# Patient Record
Sex: Female | Born: 1985
Health system: Southern US, Community
[De-identification: ages and names within clinical notes are randomized; demographics above are authoritative.]

## PROBLEM LIST (undated history)

## (undated) DIAGNOSIS — R109 Unspecified abdominal pain: Secondary | ICD-10-CM

## (undated) DIAGNOSIS — I10 Essential (primary) hypertension: Secondary | ICD-10-CM

## (undated) DIAGNOSIS — E876 Hypokalemia: Secondary | ICD-10-CM

## (undated) DIAGNOSIS — D735 Infarction of spleen: Secondary | ICD-10-CM

## (undated) DIAGNOSIS — I151 Hypertension secondary to other renal disorders: Secondary | ICD-10-CM

## (undated) DIAGNOSIS — E669 Obesity, unspecified: Secondary | ICD-10-CM

## (undated) DIAGNOSIS — E538 Deficiency of other specified B group vitamins: Secondary | ICD-10-CM

## (undated) DIAGNOSIS — C801 Malignant (primary) neoplasm, unspecified: Secondary | ICD-10-CM

## (undated) DIAGNOSIS — F1010 Alcohol abuse, uncomplicated: Secondary | ICD-10-CM

## (undated) DIAGNOSIS — M359 Systemic involvement of connective tissue, unspecified: Secondary | ICD-10-CM

## (undated) DIAGNOSIS — K859 Acute pancreatitis without necrosis or infection, unspecified: Secondary | ICD-10-CM

## (undated) DIAGNOSIS — E119 Type 2 diabetes mellitus without complications: Secondary | ICD-10-CM

## (undated) DIAGNOSIS — J90 Pleural effusion, not elsewhere classified: Secondary | ICD-10-CM

## (undated) HISTORY — DX: Alcohol abuse, uncomplicated: F10.10

## (undated) HISTORY — PX: TONSILLECTOMY: SUR1361

## (undated) HISTORY — DX: Hypokalemia: E87.6

## (undated) HISTORY — DX: Hypertension secondary to other renal disorders: I15.1

## (undated) HISTORY — DX: Deficiency of other specified B group vitamins: E53.8

## (undated) HISTORY — PX: OTHER SURGICAL HISTORY: SHX169

## (undated) HISTORY — PX: ADENOIDECTOMY: SUR15

## (undated) HISTORY — DX: Acute pancreatitis without necrosis or infection, unspecified: K85.90

## (undated) HISTORY — PX: CHEST TUBE INSERTION: SHX231

---
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
## 2005-02-06 ENCOUNTER — Emergency Department: Payer: Self-pay | Admitting: Emergency Medicine

## 2005-08-26 ENCOUNTER — Emergency Department: Payer: Self-pay | Admitting: Internal Medicine

## 2006-07-28 ENCOUNTER — Emergency Department: Payer: Self-pay | Admitting: Internal Medicine

## 2007-08-06 ENCOUNTER — Inpatient Hospital Stay (HOSPITAL_COMMUNITY): Admission: AD | Admit: 2007-08-06 | Discharge: 2007-08-06 | Payer: Self-pay | Admitting: Obstetrics and Gynecology

## 2007-08-06 ENCOUNTER — Ambulatory Visit: Payer: Self-pay | Admitting: *Deleted

## 2007-09-16 ENCOUNTER — Inpatient Hospital Stay (HOSPITAL_COMMUNITY): Admission: AD | Admit: 2007-09-16 | Discharge: 2007-09-16 | Payer: Self-pay | Admitting: Obstetrics & Gynecology

## 2007-09-16 ENCOUNTER — Ambulatory Visit: Payer: Self-pay | Admitting: Physician Assistant

## 2007-09-22 ENCOUNTER — Inpatient Hospital Stay (HOSPITAL_COMMUNITY): Admission: AD | Admit: 2007-09-22 | Discharge: 2007-09-22 | Payer: Self-pay | Admitting: Obstetrics & Gynecology

## 2007-09-22 ENCOUNTER — Ambulatory Visit: Payer: Self-pay | Admitting: Obstetrics & Gynecology

## 2007-09-27 ENCOUNTER — Ambulatory Visit: Payer: Self-pay | Admitting: Obstetrics and Gynecology

## 2007-09-27 ENCOUNTER — Inpatient Hospital Stay (HOSPITAL_COMMUNITY): Admission: AD | Admit: 2007-09-27 | Discharge: 2007-09-30 | Payer: Self-pay | Admitting: Obstetrics & Gynecology

## 2007-09-28 ENCOUNTER — Encounter: Payer: Self-pay | Admitting: Obstetrics & Gynecology

## 2011-04-27 LAB — URINALYSIS, DIPSTICK ONLY
Glucose, UA: NEGATIVE
Ketones, ur: >80 — AB
Leukocytes, UA: NEGATIVE
Protein, ur: NEGATIVE
Specific Gravity, Urine: 1.025
Urobilinogen, UA: 0.2

## 2011-04-27 LAB — COMPREHENSIVE METABOLIC PANEL
ALT: 13
AST: 17
Alkaline Phosphatase: 192 — ABNORMAL HIGH
CO2: 21
Chloride: 102
GFR calc Af Amer: >60
GFR calc non Af Amer: >60
Potassium: 3.9
Sodium: 134 — ABNORMAL LOW
Total Bilirubin: 0.6

## 2011-04-27 LAB — URIC ACID: Uric Acid, Serum: 4

## 2011-04-27 LAB — CBC
HCT: 39.8
Hemoglobin: 13.8
MCHC: 34.5
MCV: 88.1
Platelets: 316
RDW: 14.5
WBC: 16.9 — ABNORMAL HIGH

## 2011-04-27 LAB — RPR: RPR Ser Ql: NONREACTIVE

## 2011-05-11 LAB — CBC
HCT: 36.3
Platelets: 341
RDW: 13.1
WBC: 14.5 — ABNORMAL HIGH

## 2011-05-11 LAB — COMPREHENSIVE METABOLIC PANEL
ALT: 9
Albumin: 2.8 — ABNORMAL LOW
Alkaline Phosphatase: 139 — ABNORMAL HIGH
Chloride: 104
GFR calc Af Amer: >60
Glucose, Bld: 76
Potassium: 3.4 — ABNORMAL LOW
Sodium: 135
Total Protein: 6.5

## 2011-05-11 LAB — WET PREP, GENITAL: Clue Cells Wet Prep HPF POC: NONE SEEN

## 2011-05-11 LAB — GC/CHLAMYDIA PROBE AMP, GENITAL
Chlamydia, DNA Probe: NEGATIVE
GC Probe Amp, Genital: NEGATIVE

## 2011-05-11 LAB — URINALYSIS, ROUTINE W REFLEX MICROSCOPIC
Bilirubin Urine: NEGATIVE
Glucose, UA: NEGATIVE
Hgb urine dipstick: NEGATIVE
Ketones, ur: NEGATIVE
Nitrite: NEGATIVE
Protein, ur: NEGATIVE
Urobilinogen, UA: 0.2
pH: 6

## 2011-05-11 LAB — FETAL FIBRONECTIN: Fetal Fibronectin: NEGATIVE

## 2011-05-27 ENCOUNTER — Emergency Department (HOSPITAL_COMMUNITY)
Admission: EM | Admit: 2011-05-27 | Discharge: 2011-05-27 | Disposition: A | Discharge status: Home / Self Care | Payer: Self-pay | Attending: Emergency Medicine | Admitting: Emergency Medicine

## 2011-05-27 ENCOUNTER — Encounter: Payer: Self-pay | Admitting: Emergency Medicine

## 2011-05-27 ENCOUNTER — Emergency Department (HOSPITAL_COMMUNITY): Payer: Self-pay

## 2011-05-27 DIAGNOSIS — M25519 Pain in unspecified shoulder: Secondary | ICD-10-CM | POA: Insufficient documentation

## 2011-05-27 DIAGNOSIS — M436 Torticollis: Secondary | ICD-10-CM | POA: Insufficient documentation

## 2011-05-27 DIAGNOSIS — M5412 Radiculopathy, cervical region: Secondary | ICD-10-CM

## 2011-05-27 DIAGNOSIS — Y92009 Unspecified place in unspecified non-institutional (private) residence as the place of occurrence of the external cause: Secondary | ICD-10-CM | POA: Insufficient documentation

## 2011-05-27 DIAGNOSIS — W07XXXA Fall from chair, initial encounter: Secondary | ICD-10-CM | POA: Insufficient documentation

## 2011-05-27 DIAGNOSIS — R51 Headache: Secondary | ICD-10-CM | POA: Insufficient documentation

## 2011-05-27 DIAGNOSIS — F17200 Nicotine dependence, unspecified, uncomplicated: Secondary | ICD-10-CM | POA: Insufficient documentation

## 2011-05-27 DIAGNOSIS — IMO0002 Reserved for concepts with insufficient information to code with codable children: Secondary | ICD-10-CM | POA: Insufficient documentation

## 2011-05-27 MED ORDER — HYDROCODONE-ACETAMINOPHEN 5-325 MG PO TABS: 1.0000 | ORAL_TABLET | ORAL | Status: AC | PRN

## 2011-05-27 MED ORDER — CYCLOBENZAPRINE HCL 5 MG PO TABS: 5.0000 mg | ORAL_TABLET | Freq: Three times a day (TID) | ORAL | Status: AC | PRN

## 2011-05-27 MED ORDER — IBUPROFEN 800 MG PO TABS: 800.0000 mg | ORAL_TABLET | Freq: Three times a day (TID) | ORAL | Status: AC

## 2011-05-27 NOTE — ED Notes (Signed)
MD at bedside. Burgess Amor Pa_c at bedside discussing  Plan of care with pt.

## 2011-05-27 NOTE — ED Notes (Signed)
Pt refused pain medication at this time as she is driving home. And has 2 small children at this time.

## 2011-05-27 NOTE — ED Notes (Signed)
Patient reports falling on Tuesday from chair trying to clean ceiling fan. Patient reports hitting head but denies LOC, blurred vision, or dizziness. Patient c/o headache and neck pain that is getting progressively worse. Patient unable to turn head. Patient reports taking ibuprofen multiple times with no relief which she states "That is not like me."

## 2011-05-27 NOTE — ED Notes (Signed)
Pt states fell while standing on kitchen  chair attempting to clean ceiling fan, hitting rt  head, shoulder and elbow. Pt states has had increasing pain and stiffness in rt side of neck and shoulder since fall occurred. Pt reports taking motrin all week with no relief.

## 2011-06-01 NOTE — ED Provider Notes (Signed)
History     CSN: [id number] Arrival date & time: 05/27/2011 10:50 AM   First MD Initiated Contact with Patient 05/27/11 1055      Chief Complaint  Patient presents with  . Fall  . Headache  . Neck Pain    (Consider location/radiation/quality/duration/timing/severity/associated sxs/prior treatment) Patient is a 25 y.o. female presenting with fall, headaches, and neck pain. The history is provided by the patient.  Fall Incident onset: five days ago. Incident: She fell from a chair as she was trying to clean a ceiling fan.  She denies loc,  but did hit her head on the floor.. She fell from a height of 3 to 5 ft. She landed on a hard floor. The point of impact was the head. The pain is present in the head. The pain is at a severity of 9/10. The pain is severe. She was ambulatory at the scene. Associated symptoms include headaches. Pertinent negatives include no visual change, no fever, no numbness, no abdominal pain, no nausea, no vomiting, no loss of consciousness and no tingling. Associated symptoms comments: She reports neck and right shoulder pain.. The symptoms are aggravated by activity. She has tried NSAIDs for the symptoms. The treatment provided no relief.  Headache  Pain location: generalized headache. The quality of the pain is described as throbbing. The pain is at a severity of 9/10. The pain is severe. The pain does not radiate. Pertinent negatives include no fever, no shortness of breath, no nausea and no vomiting.  Neck Pain  The pain is present in the right side. The quality of the pain is described as aching. The pain is at a severity of 9/10. The symptoms are aggravated by twisting and position. Associated symptoms include headaches. Pertinent negatives include no visual change, no chest pain, no numbness, no paresis, no tingling and no weakness.    History reviewed. No pertinent past medical history.  History reviewed. No pertinent past surgical history.  History  reviewed. No pertinent family history.  History  Substance Use Topics  . Smoking status: Current Everyday Smoker -- 0.5 packs/day for 9 years    Types: Cigarettes  . Smokeless tobacco: Never Used  . Alcohol Use: 1.2 oz/week    2 Cans of beer per week    OB History    Grav Para Term Preterm Abortions TAB SAB Ect Mult Living   2 2 2       2       Review of Systems  Constitutional: Negative for fever.  HENT: Positive for neck pain. Negative for congestion and sore throat.   Eyes: Negative.   Respiratory: Negative for chest tightness and shortness of breath.   Cardiovascular: Negative for chest pain.  Gastrointestinal: Negative for nausea, vomiting and abdominal pain.  Genitourinary: Negative.   Musculoskeletal: Negative for joint swelling and arthralgias.  Skin: Negative.  Negative for rash and wound.  Neurological: Positive for headaches. Negative for dizziness, tingling, loss of consciousness, weakness, light-headedness and numbness.  Hematological: Negative.   Psychiatric/Behavioral: Negative.     Allergies  Review of patient's allergies indicates no known allergies.  Home Medications   Current Outpatient Rx  Name Route Sig Dispense Refill  . IBUPROFEN 200 MG PO TABS Oral Take 400 mg by mouth every 6 (six) hours as needed. For pain     . LEVONORGESTREL 20 MCG/24HR IU IUD Intrauterine 1 each by Intrauterine route once.      . CYCLOBENZAPRINE HCL 5 MG PO TABS Oral Take 1 tablet (  5 mg total) by mouth 3 (three) times daily as needed for muscle spasms. 15 tablet 0  . HYDROCODONE-ACETAMINOPHEN 5-325 MG PO TABS Oral Take 1 tablet by mouth every 4 (four) hours as needed for pain. 15 tablet 0  . IBUPROFEN 800 MG PO TABS Oral Take 1 tablet (800 mg total) by mouth 3 (three) times daily. 21 tablet 0    BP 130/82  Pulse 85  Temp(Src) 98.5 F (36.9 C) (Oral)  Resp 18  Ht 5' 3.5" (1.613 m)  Wt 184 lb (83.462 kg)  BMI 32.08 kg/m2  SpO2 97%  Physical Exam  Nursing note and  vitals reviewed. Constitutional: She is oriented to person, place, and time. She appears well-developed and well-nourished.       Uncomfortable appearing  HENT:  Head: Normocephalic and atraumatic.  Mouth/Throat: Oropharynx is clear and moist.  Eyes: EOM are normal. Pupils are equal, round, and reactive to light.  Neck: Trachea normal and normal range of motion. Neck supple. Muscular tenderness present.    Cardiovascular: Normal rate and normal heart sounds.   Pulmonary/Chest: Effort normal.  Abdominal: Soft. There is no tenderness.  Musculoskeletal: Normal range of motion.  Lymphadenopathy:    She has no cervical adenopathy.  Neurological: She is alert and oriented to person, place, and time. She has normal strength. No cranial nerve deficit or sensory deficit. She displays a negative Romberg sign. Gait normal. GCS eye subscore is 4. GCS verbal subscore is 5. GCS motor subscore is 6.       Normal heel-shin, normal rapid alternating movements.  Skin: Skin is warm and dry. No rash noted.  Psychiatric: She has a normal mood and affect. Her speech is normal and behavior is normal. Thought content normal. Cognition and memory are normal.    ED Course  Procedures (including critical care time)   Labs Reviewed  POCT PREGNANCY, URINE  LAB REPORT - SCANNED   No results found.   1. Torticollis, acute   2. Cervical radicular pain       MDM  Ct and xray right shoulder normal.          Candis Musa, PA 06/01/11 2049

## 2011-06-02 NOTE — ED Provider Notes (Signed)
Medical screening examination/treatment/procedure(s) were performed by non-physician practitioner and as supervising physician I was immediately available for consultation/collaboration.   Jasier Calabretta W. Aleksi Brummet, MD 06/02/11 0732 

## 2014-06-07 ENCOUNTER — Encounter: Payer: Self-pay | Admitting: Emergency Medicine

## 2014-06-22 LAB — OB RESULTS CONSOLE VARICELLA ZOSTER ANTIBODY, IGG: VARICELLA IGG: IMMUNE

## 2014-06-22 LAB — OB RESULTS CONSOLE RPR: RPR: NONREACTIVE

## 2014-06-22 LAB — OB RESULTS CONSOLE RUBELLA ANTIBODY, IGM: RUBELLA: IMMUNE

## 2014-06-22 LAB — OB RESULTS CONSOLE ABO/RH: RH Type: POSITIVE

## 2014-06-22 LAB — OB RESULTS CONSOLE HEPATITIS B SURFACE ANTIGEN: HEP B S AG: NEGATIVE

## 2014-06-22 LAB — OB RESULTS CONSOLE HIV ANTIBODY (ROUTINE TESTING): HIV: NONREACTIVE

## 2014-07-07 DIAGNOSIS — I10 Essential (primary) hypertension: Secondary | ICD-10-CM | POA: Insufficient documentation

## 2014-07-07 DIAGNOSIS — F32A Depression, unspecified: Secondary | ICD-10-CM | POA: Insufficient documentation

## 2014-07-07 DIAGNOSIS — E669 Obesity, unspecified: Secondary | ICD-10-CM | POA: Insufficient documentation

## 2014-08-06 NOTE — L&D Delivery Note (Signed)
Deliver Note   Date of Delivery:   01/22/2015 Primary OB:   [facility] Gestational Age/EDD: [redacted]w[redacted]d by 02/07/2015, by Last Menstrual Period  Antepartum complications:  OB History    Gravida Para Term Preterm AB TAB SAB Ectopic Multiple Living   3 3 3       0 3      Delivered By:   Malachy Mood MD  Delivery Type:   TSVD Anesthesia:     Epidural  Intrapartum complications:  GBS:    Positive (06/08 0000) Laceration:    none Episiotomy:    none Placenta:    Spontaneous Estimated Blood Loss:  273mL Baby:    Liveborn female , APGAR (1 MIN): 8   APGAR (5 MINS): 9   APGAR (10 MINS):  , weight pending   Deliver Details   At  a female was delivered via  (Presentation: OA  ).  APGAR: 8 ,9 ; weight pending.   Placenta status: intact and spontaneous, .  e vessel Cord:  with the following complications: .mild preeclampsia, IOL    Mom to postpartum.  Baby to Couplet care / Skin to Skin.

## 2015-01-12 LAB — OB RESULTS CONSOLE GBS: GBS: POSITIVE

## 2015-01-19 ENCOUNTER — Observation Stay
Admission: EM | Admit: 2015-01-19 | Discharge: 2015-01-19 | Disposition: A | Discharge status: Home / Self Care | Payer: 59 | Source: Home / Self Care | Admitting: Obstetrics and Gynecology

## 2015-01-19 ENCOUNTER — Encounter: Payer: Self-pay | Admitting: *Deleted

## 2015-01-19 DIAGNOSIS — Z6835 Body mass index (BMI) 35.0-35.9, adult: Secondary | ICD-10-CM

## 2015-01-19 DIAGNOSIS — R809 Proteinuria, unspecified: Secondary | ICD-10-CM | POA: Diagnosis present

## 2015-01-19 DIAGNOSIS — F17210 Nicotine dependence, cigarettes, uncomplicated: Secondary | ICD-10-CM | POA: Diagnosis present

## 2015-01-19 DIAGNOSIS — O99214 Obesity complicating childbirth: Secondary | ICD-10-CM | POA: Diagnosis present

## 2015-01-19 DIAGNOSIS — O1403 Mild to moderate pre-eclampsia, third trimester: Principal | ICD-10-CM | POA: Diagnosis present

## 2015-01-19 DIAGNOSIS — O99334 Smoking (tobacco) complicating childbirth: Secondary | ICD-10-CM | POA: Diagnosis present

## 2015-01-19 DIAGNOSIS — O1092 Unspecified pre-existing hypertension complicating childbirth: Secondary | ICD-10-CM | POA: Diagnosis present

## 2015-01-19 DIAGNOSIS — Z3A37 37 weeks gestation of pregnancy: Secondary | ICD-10-CM | POA: Diagnosis present

## 2015-01-19 DIAGNOSIS — O163 Unspecified maternal hypertension, third trimester: Secondary | ICD-10-CM | POA: Diagnosis present

## 2015-01-19 DIAGNOSIS — K088 Other specified disorders of teeth and supporting structures: Secondary | ICD-10-CM | POA: Diagnosis present

## 2015-01-19 LAB — CBC WITH DIFFERENTIAL/PLATELET
BASOS ABS: 0.1 10*3/uL (ref 0–0.1)
BASOS PCT: 1 %
Eosinophils Absolute: 0.1 10*3/uL (ref 0–0.7)
Eosinophils Relative: 1 %
HEMATOCRIT: 36.5 % (ref 35.0–47.0)
Hemoglobin: 12.4 g/dL (ref 12.0–16.0)
LYMPHS PCT: 24 %
Lymphs Abs: 2.5 10*3/uL (ref 1.0–3.6)
MCH: 29.5 pg (ref 26.0–34.0)
MCHC: 33.8 g/dL (ref 32.0–36.0)
MCV: 87.1 fL (ref 80.0–100.0)
MONO ABS: 0.4 10*3/uL (ref 0.2–0.9)
Monocytes Relative: 4 %
NEUTROS ABS: 7.3 10*3/uL — AB (ref 1.4–6.5)
NEUTROS PCT: 70 %
PLATELETS: 265 10*3/uL (ref 150–440)
RBC: 4.19 MIL/uL (ref 3.80–5.20)
RDW: 13.9 % (ref 11.5–14.5)
WBC: 10.5 10*3/uL (ref 3.6–11.0)

## 2015-01-19 LAB — COMPREHENSIVE METABOLIC PANEL
ALBUMIN: 2.9 g/dL — AB (ref 3.5–5.0)
ALK PHOS: 180 U/L — AB (ref 38–126)
ALT: 9 U/L — ABNORMAL LOW (ref 14–54)
AST: 20 U/L (ref 15–41)
Anion gap: 8 (ref 5–15)
BUN: 6 mg/dL (ref 6–20)
CO2: 21 mmol/L — ABNORMAL LOW (ref 22–32)
Calcium: 8.6 mg/dL — ABNORMAL LOW (ref 8.9–10.3)
Chloride: 108 mmol/L (ref 101–111)
Creatinine, Ser: 0.55 mg/dL (ref 0.44–1.00)
GFR calc Af Amer: >60 mL/min (ref >60)
GFR calc non Af Amer: >60 mL/min (ref >60)
Glucose, Bld: 159 mg/dL — ABNORMAL HIGH (ref 65–99)
Potassium: 3.3 mmol/L — ABNORMAL LOW (ref 3.5–5.1)
Sodium: 137 mmol/L (ref 135–145)
Total Protein: 6.5 g/dL (ref 6.5–8.1)

## 2015-01-19 LAB — PROTEIN / CREATININE RATIO, URINE
Creatinine, Urine: 206 mg/dL
Protein Creatinine Ratio: 0.32 mg/mg{Cre} — ABNORMAL HIGH (ref 0.00–0.15)
Total Protein, Urine: 66 mg/dL

## 2015-01-19 MED ORDER — HYDRALAZINE HCL 20 MG/ML IJ SOLN: 10.0000 mg | Freq: Once | INTRAMUSCULAR | Status: DC | PRN

## 2015-01-19 MED ORDER — LABETALOL HCL 5 MG/ML IV SOLN: 20.0000 mg | INTRAVENOUS | Status: DC | PRN

## 2015-01-19 NOTE — OB Triage Provider Note (Signed)
Obstetric History and Physical  Jillian Shepherd is a 29 y.o. [id number] with Estimated Date of Delivery: 02/07/15 per LMP and 19 wk [place] who presents at [redacted]w[redacted]d from office for evaluation after BP of 138/90, 2+ proteinuria and pt's c/o worsening edema and headache. Patient states she has been having irregular contractions, no vaginal bleeding, intact membranes, with active fetal movement.    Prenatal Course Source of Care: [facility], tx of care from [facility]. Of note, pt had 2 elevated BPs in late 1st/early 2nd trimesters. Otherwise she has been normotensive until today. Pregnancy complications or risks: Patient Active Problem List   Diagnosis Date Noted  . Elevated blood pressure affecting pregnancy in third trimester, antepartum 01/19/2015      Prenatal Transfer Tool   History reviewed. No pertinent past medical history.  Past Surgical History  Procedure Laterality Date  . Tonsillectomy      OB History  Gravida Para Term Preterm AB SAB TAB Ectopic Multiple Living  3 2 2       2     # Outcome Date GA Lbr Len/2nd Weight Sex Delivery Anes PTL Lv  3 Current           2 Term 09/28/07    [place]  1 Term 04/22/03    [address]   Y      History   Social History  . Marital Status: N/A    Spouse Name: N/A  . Number of Children: N/A  . Years of Education: N/A   Social History Main Topics  . Smoking status: Current Every Day Smoker -- 0.50 packs/day    Types: Cigarettes  . Smokeless tobacco: Never Used  . Alcohol Use: No  . Drug Use: No  . Sexual Activity: Yes   Other Topics Concern  . None   Social History Narrative  . None    History reviewed. No pertinent family history.  No prescriptions prior to admission  Medications: prenatal vitamins  No Known Allergies  Review of Systems: Negative except for what is mentioned in HPI.  Physical Exam: BP 128/78 mmHg  Pulse 91 BP: 175-102/58-52 (only 1 diastolic of 90)  GENERAL: Well-developed, well-nourished female in  no acute distress.  ABDOMEN: Soft, nontender, nondistended, gravid. Presentation: cephalic FHT: Category: 1 Baseline rate 120-135 bpm   Variability moderate  Accelerations present   Decelerations none Contractions: irregular   Pertinent Labs/Studies:   Results for orders placed or performed during the hospital encounter of 01/19/15 (from the past 24 hour(s))  Protein / creatinine ratio, urine     Status: Abnormal   Collection Time: 01/19/15  3:04 PM  Result Value Ref Range   Creatinine, Urine 206 mg/dL   Total Protein, Urine 66 mg/dL   Protein Creatinine Ratio 0.32 (H) 0.00 - 0.15 mg/mg[Cre]  Comprehensive metabolic panel     Status: Abnormal   Collection Time: 01/19/15  3:11 PM  Result Value Ref Range   Sodium 137 135 - 145 mmol/L   Potassium 3.3 (L) 3.5 - 5.1 mmol/L   Chloride 108 101 - 111 mmol/L   CO2 21 (L) 22 - 32 mmol/L   Glucose, Bld 159 (H) 65 - 99 mg/dL   BUN 6 6 - 20 mg/dL   Creatinine, Ser 0.55 0.44 - 1.00 mg/dL   Calcium 8.6 (L) 8.9 - 10.3 mg/dL   Total Protein 6.5 6.5 - 8.1 g/dL   Albumin 2.9 (L) 3.5 - 5.0 g/dL   AST 20 15 -  41 U/L   ALT 9 (L) 14 - 54 U/L   Alkaline Phosphatase 180 (H) 38 - 126 U/L   Total Bilirubin <0.1 (L) 0.3 - 1.2 mg/dL   GFR calc non Af Amer >60 >60 mL/min   GFR calc Af Amer >60 >60 mL/min   Anion gap 8 5 - 15  CBC with Differential     Status: Abnormal   Collection Time: 01/19/15  3:11 PM  Result Value Ref Range   WBC 10.5 3.6 - 11.0 K/uL   RBC 4.19 3.80 - 5.20 MIL/uL   Hemoglobin 12.4 12.0 - 16.0 g/dL   HCT 36.5 35.0 - 47.0 %   MCV 87.1 80.0 - 100.0 fL   MCH 29.5 26.0 - 34.0 pg   MCHC 33.8 32.0 - 36.0 g/dL   RDW 13.9 11.5 - 14.5 %   Platelets 265 150 - 440 K/uL   Neutrophils Relative % 70 %   Neutro Abs 7.3 (H) 1.4 - 6.5 K/uL   Lymphocytes Relative 24 %   Lymphs Abs 2.5 1.0 - 3.6 K/uL   Monocytes Relative 4 %   Monocytes Absolute 0.4 0.2 - 0.9 K/uL   Eosinophils Relative 1 %   Eosinophils Absolute 0.1 0 - 0.7 K/uL   Basophils  Relative 1 %   Basophils Absolute 0.1 0 - 0.1 K/uL    Assessment : IUP at [redacted]w[redacted]d, proteinuria per PC Ratio, mild range BP  Plan: Discussed results of serial BPs and labs with pt and with Dr Georgianne Fick. Given 2 diastolic BP at 90 and PC Ratio 320, Dr Georgianne Fick recommended monitoring pt in-patient and collecting 24 hour urine to futher assess for diagnosis of pre-eclampsia. Reviewed plan with pt who states she has to leave to care for her kids as they are home alone (9 yo and 77 yo). Decision made to have patient do outpatient 24 hour urine and to call office in am to schedule appt to check BP. Pt given urine jug and hat and instructions for collecting specimen.  Pre-eclampsia and labor precautions reviewed.

## 2015-01-19 NOTE — OB Triage Note (Signed)
Sent from office for [place] eval.

## 2015-01-20 ENCOUNTER — Inpatient Hospital Stay
Admission: EM | Admit: 2015-01-20 | Discharge: 2015-01-23 | DRG: 774 | Disposition: A | Discharge status: Home / Self Care | Payer: 59 | Source: Ambulatory Visit | Attending: Obstetrics and Gynecology | Admitting: Obstetrics and Gynecology

## 2015-01-20 DIAGNOSIS — O163 Unspecified maternal hypertension, third trimester: Secondary | ICD-10-CM

## 2015-01-20 DIAGNOSIS — O169 Unspecified maternal hypertension, unspecified trimester: Secondary | ICD-10-CM | POA: Diagnosis present

## 2015-01-20 DIAGNOSIS — R030 Elevated blood-pressure reading, without diagnosis of hypertension: Secondary | ICD-10-CM | POA: Diagnosis present

## 2015-01-20 HISTORY — DX: Obesity, unspecified: E66.9

## 2015-01-20 MED ORDER — ACETAMINOPHEN 325 MG PO TABS
650.0000 mg | ORAL_TABLET | ORAL | Status: DC | PRN
  Administered 2015-01-20 – 2015-01-21 (×5): 650 mg via ORAL
  Filled 2015-01-20 (×3): qty 2

## 2015-01-20 MED ORDER — PRENATAL MULTIVITAMIN CH: 1.0000 | ORAL_TABLET | Freq: Every day | ORAL | Status: DC

## 2015-01-20 MED ORDER — NIFEDIPINE 10 MG PO CAPS: 10.0000 mg | ORAL_CAPSULE | ORAL | Status: DC | PRN

## 2015-01-20 MED ORDER — ZOLPIDEM TARTRATE 5 MG PO TABS
5.0000 mg | ORAL_TABLET | Freq: Every evening | ORAL | Status: DC | PRN
Start: 2015-01-20 — End: 2015-01-21

## 2015-01-20 NOTE — OB Triage Note (Signed)
Pt here after being seen at [facility], reports elevated BP this AM. Pt denies blurred vision and epigastric pain, reports headache. Started 24 hour urine collection at 0600 today.

## 2015-01-20 NOTE — H&P (Signed)
Obstetric History and Physical  Jillian Shepherd is a 29 y.o. [id number] with IUP at [redacted]w[redacted]d presenting for elevated blood pressure. The patient was seen yesterday in L&D for elevated blood pressure in the office. She also reports headaches that have not been relieved by tylenol. She states it is right behind her eyes. She denies visual changes and RUQ pain. It was recommended that she stay in-patient for observation and a 24-hour urine, but she needed to go home to care for her small children. She was seen again today for  BP check in the office and she reports it was 142/92 so she was sent back over to L&D. Patient states she has +FM, denies vb or lof.   Prenatal Course Source of Care: [facility]  with transfer of care from [facility] at 21 weeks.  Pregnancy complications or risks:  UTI, AC >97%tile with recommended growth scan at 28 weeks which was 77%tile.  Patient Active Problem List   Diagnosis Date Noted  . Elevated blood pressure reading without diagnosis of hypertension 01/20/2015  . Elevated blood pressure affecting pregnancy in third trimester, antepartum 01/19/2015    Prenatal labs and studies: ABO, Rh: O/Positive/-- (11/17 0000) Antibody:   Rubella: Immune (11/17 0000) Varicella: immune RPR: Nonreactive (11/17 0000)  HBsAg: Negative (11/17 0000)  HIV: Non-reactive (11/17 0000)  MWN:[id number] (06/08 0000) 1 hr Glucola  Normal  Anatomy US normal Tdap: given in pregnancy  Flu: NA   Past Medical History  Diagnosis Date  . Obesity (BMI 35.0-39.9 without comorbidity)     Past Surgical History  Procedure Laterality Date  . Tonsillectomy    . Adenoidectomy      OB History  Gravida Para Term Preterm AB SAB TAB Ectopic Multiple Living  3 2 2       2     # Outcome Date GA Lbr Len/2nd Weight Sex Delivery Anes PTL Lv  3 Current           2 Term 09/28/07    [place]  1 Term 04/22/03    [address]   Y      History   Social History  . Marital Status: N/A    Spouse Name: N/A   . Number of Children: N/A  . Years of Education: N/A   Social History Main Topics  . Smoking status: Current Every Day Smoker -- 0.50 packs/day    Types: Cigarettes  . Smokeless tobacco: Never Used  . Alcohol Use: No  . Drug Use: No  . Sexual Activity: Yes    Birth Control/ Protection: None   Other Topics Concern  . None   Social History Narrative    History reviewed. No pertinent family history.  Prescriptions prior to admission  Medication Sig Dispense Refill Last Dose  . Prenatal Vit-Fe Fumarate-FA (PRENATAL MULTIVITAMIN) TABS tablet Take 1 tablet by mouth daily at 12 noon.   01/20/2015 at Unknown time    No Known Allergies  Review of Systems: Negative except for what is mentioned in HPI.  Physical Exam: BP 139/84 mmHg  Pulse 87  Temp(Src) 98.8 F (37.1 C) (Oral)  Resp 16  LMP 05/03/2014 GENERAL: Well-developed, well-nourished female in no acute distress.  LUNGS: Clear to auscultation bilaterally.  HEART: Regular rate and rhythm. ABDOMEN: Soft, nontender, nondistended, gravid. EXTREMITIES: Nontender, no edema, 2+ distal pulses.   Presentation: not assessed at this time  FHT:  Baseline rate 130 bpm   Variability moderate  Accelerations present   Decelerations none  Contractions: irregular   Pertinent Labs/Studies:   Results for orders placed or performed during the hospital encounter of 01/19/15 (from the past 24 hour(s))  Protein / creatinine ratio, urine     Status: Abnormal   Collection Time: 01/19/15  3:04 PM  Result Value Ref Range   Creatinine, Urine 206 mg/dL   Total Protein, Urine 66 mg/dL   Protein Creatinine Ratio 0.32 (H) 0.00 - 0.15 mg/mg[Cre]  Comprehensive metabolic panel     Status: Abnormal   Collection Time: 01/19/15  3:11 PM  Result Value Ref Range   Sodium 137 135 - 145 mmol/L   Potassium 3.3 (L) 3.5 - 5.1 mmol/L   Chloride 108 101 - 111 mmol/L   CO2 21 (L) 22 - 32 mmol/L   Glucose, Bld 159 (H) 65 - 99 mg/dL   BUN 6 6 - 20 mg/dL    Creatinine, Ser 0.55 0.44 - 1.00 mg/dL   Calcium 8.6 (L) 8.9 - 10.3 mg/dL   Total Protein 6.5 6.5 - 8.1 g/dL   Albumin 2.9 (L) 3.5 - 5.0 g/dL   AST 20 15 - 41 U/L   ALT 9 (L) 14 - 54 U/L   Alkaline Phosphatase 180 (H) 38 - 126 U/L   Total Bilirubin <0.1 (L) 0.3 - 1.2 mg/dL   GFR calc non Af Amer >60 >60 mL/min   GFR calc Af Amer >60 >60 mL/min   Anion gap 8 5 - 15  CBC with Differential     Status: Abnormal   Collection Time: 01/19/15  3:11 PM  Result Value Ref Range   WBC 10.5 3.6 - 11.0 K/uL   RBC 4.19 3.80 - 5.20 MIL/uL   Hemoglobin 12.4 12.0 - 16.0 g/dL   HCT 36.5 35.0 - 47.0 %   MCV 87.1 80.0 - 100.0 fL   MCH 29.5 26.0 - 34.0 pg   MCHC 33.8 32.0 - 36.0 g/dL   RDW 13.9 11.5 - 14.5 %   Platelets 265 150 - 440 K/uL   Neutrophils Relative % 70 %   Neutro Abs 7.3 (H) 1.4 - 6.5 K/uL   Lymphocytes Relative 24 %   Lymphs Abs 2.5 1.0 - 3.6 K/uL   Monocytes Relative 4 %   Monocytes Absolute 0.4 0.2 - 0.9 K/uL   Eosinophils Relative 1 %   Eosinophils Absolute 0.1 0 - 0.7 K/uL   Basophils Relative 1 %   Basophils Absolute 0.1 0 - 0.1 K/uL    Assessment : Jillian Shepherd is a 29 y.o. [id number] at [redacted]w[redacted]d being seen for elevated BP in the office  Plan: Will continue serial BP checks Continue 24 hour urine Tylenol for pain Possible admit for IOL vs in-patient observation of BP and s/sx Plan discussed with Dr. Kenton Kingfisher who will take over patient care at this time.   Louisa Second, [facility]

## 2015-01-20 NOTE — Progress Notes (Signed)
Patient ID: Jillian Shepherd, female   DOB: [birth year], 29 y.o.   MRN: [id number]  Labor Progress Note   29 y.o. [id number] @ [redacted]w[redacted]d , admitted for  Pregnancy, with Hypertnsion.  Pos proteinuria.  Headache this am.  No blurry vision or epig pain.  Subjective:  As above.  Monitoring as BP high in office again.  Objective:  BP 124/77 mmHg  Pulse 80  Temp(Src) 98.8 F (37.1 C) (Oral)  Resp 16  LMP 05/03/2014 Abd: gravid NT Extr: trace to 1+ bilateral pedal edema SVE: deferred  EFM: FHR: 140 bpm, variability: moderate,  accelerations:  Present,  decelerations:  Absent Toco: None Labs:  Current lab results:  Recent Labs  01/19/15 1511  WBC 10.5  HGB 12.4  HCT 36.5     Assessment & Plan:  [id number] @ [redacted]w[redacted]d, admitted for  Pregnancy and Labor/Delivery Management  1. FWB: FHT category 1.  2. HTN mgt-  Most BPs normal here.  Will collect 24 hour proteinuria.  Monior for signs of preclampsia.  All discussed with patient, see orders

## 2015-01-21 ENCOUNTER — Inpatient Hospital Stay: Payer: 59 | Admitting: Anesthesiology

## 2015-01-21 DIAGNOSIS — R809 Proteinuria, unspecified: Secondary | ICD-10-CM | POA: Diagnosis present

## 2015-01-21 DIAGNOSIS — O99334 Smoking (tobacco) complicating childbirth: Secondary | ICD-10-CM | POA: Diagnosis present

## 2015-01-21 DIAGNOSIS — Z3A37 37 weeks gestation of pregnancy: Secondary | ICD-10-CM | POA: Diagnosis present

## 2015-01-21 DIAGNOSIS — O1092 Unspecified pre-existing hypertension complicating childbirth: Secondary | ICD-10-CM | POA: Diagnosis present

## 2015-01-21 DIAGNOSIS — Z6835 Body mass index (BMI) 35.0-35.9, adult: Secondary | ICD-10-CM | POA: Diagnosis present

## 2015-01-21 DIAGNOSIS — K088 Other specified disorders of teeth and supporting structures: Secondary | ICD-10-CM | POA: Diagnosis present

## 2015-01-21 DIAGNOSIS — O1403 Mild to moderate pre-eclampsia, third trimester: Secondary | ICD-10-CM | POA: Diagnosis present

## 2015-01-21 DIAGNOSIS — F17210 Nicotine dependence, cigarettes, uncomplicated: Secondary | ICD-10-CM | POA: Diagnosis present

## 2015-01-21 DIAGNOSIS — O99214 Obesity complicating childbirth: Secondary | ICD-10-CM | POA: Diagnosis present

## 2015-01-21 LAB — CBC
HEMATOCRIT: 39.1 % (ref 35.0–47.0)
Hemoglobin: 12.9 g/dL (ref 12.0–16.0)
MCH: 29 pg (ref 26.0–34.0)
MCHC: 33 g/dL (ref 32.0–36.0)
MCV: 87.9 fL (ref 80.0–100.0)
Platelets: 258 10*3/uL (ref 150–440)
RBC: 4.44 MIL/uL (ref 3.80–5.20)
RDW: 13.9 % (ref 11.5–14.5)
WBC: 12.1 10*3/uL — ABNORMAL HIGH (ref 3.6–11.0)

## 2015-01-21 LAB — PROTEIN, URINE, 24 HOUR
Collection Interval-UPROT: 24 hours
Protein, 24H Urine: 480 mg/d — ABNORMAL HIGH (ref 50–100)
Protein, Urine: 32 mg/dL
Urine Total Volume-UPROT: 1500 mL

## 2015-01-21 LAB — TYPE AND SCREEN
ABO/RH(D): O POS
Antibody Screen: NEGATIVE

## 2015-01-21 LAB — ABO/RH: ABO/RH(D): O POS

## 2015-01-21 MED ORDER — MISOPROSTOL 200 MCG PO TABS
ORAL_TABLET | ORAL | Status: DC
Start: 2015-01-21 — End: 2015-01-22
  Filled 2015-01-21: qty 4

## 2015-01-21 MED ORDER — LACTATED RINGERS IV SOLN
INTRAVENOUS | Status: DC
  Administered 2015-01-21 (×2): via INTRAVENOUS

## 2015-01-21 MED ORDER — OXYTOCIN 10 UNIT/ML IJ SOLN
INTRAMUSCULAR | Status: AC
  Filled 2015-01-21: qty 2

## 2015-01-21 MED ORDER — ACETAMINOPHEN 325 MG PO TABS
ORAL_TABLET | ORAL | Status: AC
  Filled 2015-01-21: qty 2

## 2015-01-21 MED ORDER — TERBUTALINE SULFATE 1 MG/ML IJ SOLN: 0.2500 mg | Freq: Once | INTRAMUSCULAR | Status: AC | PRN

## 2015-01-21 MED ORDER — SODIUM CHLORIDE 0.9 % IJ SOLN
INTRAMUSCULAR | Status: AC
  Filled 2015-01-21: qty 20

## 2015-01-21 MED ORDER — CITRIC ACID-SODIUM CITRATE 334-500 MG/5ML PO SOLN: 30.0000 mL | ORAL | Status: DC | PRN

## 2015-01-21 MED ORDER — BUTORPHANOL TARTRATE 1 MG/ML IJ SOLN: 1.0000 mg | INTRAMUSCULAR | Status: DC | PRN

## 2015-01-21 MED ORDER — ACETAMINOPHEN 325 MG PO TABS: 650.0000 mg | ORAL_TABLET | ORAL | Status: DC | PRN

## 2015-01-21 MED ORDER — AMMONIA AROMATIC IN INHA
RESPIRATORY_TRACT | Status: AC
  Filled 2015-01-21: qty 10

## 2015-01-21 MED ORDER — ONDANSETRON HCL 4 MG/2ML IJ SOLN
4.0000 mg | Freq: Four times a day (QID) | INTRAMUSCULAR | Status: DC | PRN
Start: 2015-01-21 — End: 2015-01-22

## 2015-01-21 MED ORDER — OXYTOCIN BOLUS FROM INFUSION: 500.0000 mL | INTRAVENOUS | Status: DC

## 2015-01-21 MED ORDER — LIDOCAINE HCL (PF) 1 % IJ SOLN
INTRAMUSCULAR | Status: AC
  Filled 2015-01-21: qty 30

## 2015-01-21 MED ORDER — SODIUM CHLORIDE 0.9 % IV SOLN
INTRAVENOUS | Status: AC
  Filled 2015-01-21: qty 1000

## 2015-01-21 MED ORDER — BENZOCAINE 10 % MT GEL
Freq: Three times a day (TID) | OROMUCOSAL | Status: DC | PRN
  Administered 2015-01-21: 17:00:00 via OROMUCOSAL
  Filled 2015-01-21 (×2): qty 9.4

## 2015-01-21 MED ORDER — SODIUM CHLORIDE 0.9 % IV SOLN
INTRAVENOUS | Status: AC
  Filled 2015-01-21: qty 2000

## 2015-01-21 MED ORDER — DINOPROSTONE 10 MG VA INST
10.0000 mg | VAGINAL_INSERT | Freq: Once | VAGINAL | Status: DC
  Filled 2015-01-21: qty 1

## 2015-01-21 MED ORDER — FENTANYL 2.5 MCG/ML W/ROPIVACAINE 0.2% IN NS 100 ML EPIDURAL INFUSION (ARMC-ANES)
EPIDURAL | Status: AC
  Administered 2015-01-21: 10 mL/h via EPIDURAL
  Filled 2015-01-21: qty 100

## 2015-01-21 MED ORDER — BUPIVACAINE HCL (PF) 0.25 % IJ SOLN
INTRAMUSCULAR | Status: DC | PRN
  Administered 2015-01-21 (×2): 4 mL

## 2015-01-21 MED ORDER — OXYTOCIN 40 UNITS IN LACTATED RINGERS INFUSION - SIMPLE MED: 62.5000 mL/h | INTRAVENOUS | Status: DC

## 2015-01-21 MED ORDER — LIDOCAINE-EPINEPHRINE (PF) 1.5 %-1:200000 IJ SOLN
INTRAMUSCULAR | Status: DC | PRN
  Administered 2015-01-21: 3 mL

## 2015-01-21 MED ORDER — OXYTOCIN 40 UNITS IN LACTATED RINGERS INFUSION - SIMPLE MED
INTRAVENOUS | Status: AC
  Filled 2015-01-21: qty 1000

## 2015-01-21 MED ORDER — OXYTOCIN 40 UNITS IN LACTATED RINGERS INFUSION - SIMPLE MED
1.0000 m[IU]/min | INTRAVENOUS | Status: DC
  Administered 2015-01-21: 1 m[IU]/min via INTRAVENOUS

## 2015-01-21 MED ORDER — LACTATED RINGERS IV SOLN: 500.0000 mL | INTRAVENOUS | Status: DC | PRN

## 2015-01-21 MED ORDER — LIDOCAINE HCL (PF) 1 % IJ SOLN
30.0000 mL | INTRAMUSCULAR | Status: DC | PRN
Start: 2015-01-21 — End: 2015-01-22

## 2015-01-21 MED ORDER — SODIUM CHLORIDE 0.9 % IV SOLN
2.0000 g | Freq: Once | INTRAVENOUS | Status: AC
  Administered 2015-01-21: 2 g via INTRAVENOUS

## 2015-01-21 MED ORDER — SODIUM CHLORIDE 0.9 % IV SOLN
1.0000 g | INTRAVENOUS | Status: DC
  Administered 2015-01-21 (×2): 1 g via INTRAVENOUS

## 2015-01-21 NOTE — Progress Notes (Signed)
Dr Georgianne Fick informed that pt more uncomfortable re tooth- no further order changes at present. Pt aware.

## 2015-01-21 NOTE — Progress Notes (Signed)
Pt brought over from antepartum for IOL.    Subjective:  Pt reports feeling some contractions that are getting closer together   Objective: BP 132/91 mmHg  Pulse 88  Temp(Src) 98.5 F (36.9 C) (Oral)  Resp 22  SpO2 98%  LMP 05/03/2014      FHT:  FHR: 125 bpm, variability: moderate,  accelerations:  Present,  decelerations:  Absent UC:   irregular, every 1-5 minutes SVE:   Dilation: 2.5 Effacement (%): 80 Station: -2 Exam by:: c Lakyn Mantione,cnm  Cervix almost 3cm   Labs: Lab Results  Component Value Date   WBC 12.1* 01/21/2015   HGB 12.9 01/21/2015   HCT 39.1 01/21/2015   MCV 87.9 01/21/2015   PLT 258 01/21/2015    Assessment / Plan: IUP at [redacted]w[redacted]d IOL for pre-eclampsia without severe features   Labor: will start pitocin for IOL  Preeclampsia:  BP stable at this time.  Fetal Wellbeing:  Category I Pain Control:  plans epidural  I/D:  Ampicillin for GBS  Anticipated MOD:  NSVD  Louisa Second 01/21/2015, 3:09 PM

## 2015-01-21 NOTE — Anesthesia Preprocedure Evaluation (Signed)
Anesthesia Evaluation  Patient identified by MRN, date of birth, ID band Patient awake    Reviewed: Allergy & Precautions, NPO status , Patient's Chart, lab work & pertinent test results  History of Anesthesia Complications Negative for: history of anesthetic complications  Airway Mallampati: II  TM Distance: >3 FB Neck ROM: Full    Dental no notable dental hx.    Pulmonary neg pulmonary ROS, Current Smoker,  breath sounds clear to auscultation  Pulmonary exam normal       Cardiovascular Exercise Tolerance: Good hypertension, Pt. on medications Normal cardiovascular examRhythm:Regular Rate:Normal     Neuro/Psych negative neurological ROS  negative psych ROS   GI/Hepatic negative GI ROS, Neg liver ROS,   Endo/Other  negative endocrine ROS  Renal/GU negative Renal ROS  negative genitourinary   Musculoskeletal negative musculoskeletal ROS (+)   Abdominal   Peds negative pediatric ROS (+)  Hematology negative hematology ROS (+)   Anesthesia Other Findings   Reproductive/Obstetrics (+) Pregnancy                             Anesthesia Physical Anesthesia Plan  ASA: III  Anesthesia Plan: Epidural   Post-op Pain Management:    Induction:   Airway Management Planned:   Additional Equipment:   Intra-op Plan:   Post-operative Plan:   Informed Consent: I have reviewed the patients History and Physical, chart, labs and discussed the procedure including the risks, benefits and alternatives for the proposed anesthesia with the patient or authorized representative who has indicated his/her understanding and acceptance.     Plan Discussed with:   Anesthesia Plan Comments:         Anesthesia Quick Evaluation

## 2015-01-21 NOTE — Progress Notes (Signed)
Subjective:  No concerns other than continued tooth pain, relieved with orajel.  BP still mostly mild range with occasional mild range.    Objective:   Vitals: Blood pressure 131/78, pulse 98, temperature 98.3 F (36.8 C), temperature source Oral, resp. rate 18, height 5\' 3"  (1.6 m), weight 97.523 kg (215 lb), last menstrual period 05/03/2014, SpO2 96 %. General: NAD Cervical Exam:  Dilation: 3 Effacement (%): 70 Cervical Position: Middle Station: -2 Presentation: Vertex Exam by:: Dr Georgianne Fick  FHT: 125, moderate, positive accels, no decels Toco: q36min  Results for orders placed or performed during the hospital encounter of 01/20/15 (from the past 24 hour(s))  Protein, urine, 24 hour     Status: Abnormal   Collection Time: 01/21/15  6:00 AM  Result Value Ref Range   Urine Total Volume-UPROT 1500 mL   Collection Interval-UPROT 24 hours   Protein, Urine 32 mg/dL   Protein, 24H Urine 480 (H) 50 - 100 mg/day  Type and screen     Status: None   Collection Time: 01/21/15  2:49 PM  Result Value Ref Range   ABO/RH(D) O POS    Antibody Screen NEG    Sample Expiration 01/24/2015   CBC     Status: Abnormal   Collection Time: 01/21/15  2:50 PM  Result Value Ref Range   WBC 12.1 (H) 3.6 - 11.0 K/uL   RBC 4.44 3.80 - 5.20 MIL/uL   Hemoglobin 12.9 12.0 - 16.0 g/dL   HCT 39.1 35.0 - 47.0 %   MCV 87.9 80.0 - 100.0 fL   MCH 29.0 26.0 - 34.0 pg   MCHC 33.0 32.0 - 36.0 g/dL   RDW 13.9 11.5 - 14.5 %   Platelets 258 150 - 440 K/uL  ABO/Rh     Status: None   Collection Time: 01/21/15  2:50 PM  Result Value Ref Range   ABO/RH(D) O POS     Assessment:   29 y.o. [id number] [redacted]w[redacted]d IOL for mild preeclampsia  Plan:   1) Labor - continue pitocin.  AROM clear fluid.    2) Fetus - category I tracing

## 2015-01-21 NOTE — Progress Notes (Signed)
Dr Georgianne Fick given update. Pt expressing that she would like hedr epidural first prior to Dr, breaking her water- as the last delivery she went very quickly to 10 cm after arom. Dr Georgianne Fick coming to assess.

## 2015-01-21 NOTE — Anesthesia Procedure Notes (Signed)
Epidural Patient location during procedure: OB Start time: 01/21/2015 9:07 PM End time: 01/21/2015 9:23 PM  Staffing Anesthesiologist: Lorane Gell Performed by: anesthesiologist   Preanesthetic Checklist Completed: patient identified, site marked, surgical consent, pre-op evaluation, timeout performed, IV checked, risks and benefits discussed and monitors and equipment checked  Epidural Patient position: sitting Prep: Betadine Patient monitoring: heart rate, continuous pulse ox and blood pressure Approach: midline Location: L4-L5 Injection technique: LOR saline  Needle:  Needle type: Tuohy  Needle gauge: 18 G Needle length: 9 cm and 9 Needle insertion depth: 6 cm Catheter type: closed end flexible Catheter size: 20 Guage Catheter at skin depth: 11 cm Test dose: negative and 1.5% lidocaine with Epi 1:200 K  Assessment Events: blood not aspirated, injection not painful, no injection resistance, negative IV test and no paresthesia  Additional Notes   Patient tolerated the insertion well without complications.Reason for block:procedure for pain

## 2015-01-21 NOTE — Progress Notes (Signed)
Patient ID: Jillian Shepherd, female   DOB: January 12, 1986, 29 y.o.   MRN: [id number] Obstetric and Gynecology  Subjective  Tooth ache, otherwise doing well  Objective   Filed Vitals:   01/21/15 0829  BP: 133/84  Pulse: 78  Temp: 97.8 F (36.6 C)  Resp: 20   Filed Vitals:   01/20/15 1530 01/20/15 2112 01/21/15 0449 01/21/15 0829  BP: 132/82 137/78 143/81 133/84  Pulse: 84 79 65 78  Temp: 98 F (36.7 C) 97.7 F (36.5 C) 98.2 F (36.8 C) 97.8 F (36.6 C)  TempSrc: Oral Oral Oral Oral  Resp: 18 20 20 20   SpO2: 98%   98%   No intake or output data in the 24 hours ending 01/21/15 1052  General: NAD HEENT: Right top posterior molar with significant caries, no drainage no induration or evidence of abscess Cardiovascular: RRR Abdomen: gravid, soft, non-tender Extremities: tace pretibial edema  Labs: Results for orders placed or performed during the hospital encounter of 01/20/15 (from the past 24 hour(s))  Protein, urine, 24 hour     Status: Abnormal   Collection Time: 01/21/15  6:00 AM  Result Value Ref Range   Urine Total Volume-UPROT 1500 mL   Collection Interval-UPROT 24 hours   Protein, Urine 32 mg/dL   Protein, 24H Urine 480 (H) 50 - 100 mg/day    Cultures: Results for orders placed or performed during the hospital encounter of 01/20/15  OB RESULT CONSOLE Group B Strep     Status: None   Collection Time: 01/12/15 12:00 AM  Result Value Ref Range Status   GBS Positive  Final    Imaging:  Assessment   29 y.o. [id number] at [redacted]w[redacted]d with mild preeclampsia by blood pressures and 24-hr urine protein  Plan   1) Discussed ACOG guidelines.  Opts to proceed with induction after discussion of pro;s and con's

## 2015-01-22 ENCOUNTER — Encounter: Payer: Self-pay | Admitting: Anesthesiology

## 2015-01-22 ENCOUNTER — Encounter: Payer: Self-pay | Admitting: *Deleted

## 2015-01-22 LAB — CBC
HEMATOCRIT: 33.8 % — AB (ref 35.0–47.0)
Hemoglobin: 11.2 g/dL — ABNORMAL LOW (ref 12.0–16.0)
MCH: 28.9 pg (ref 26.0–34.0)
MCHC: 33.2 g/dL (ref 32.0–36.0)
MCV: 87 fL (ref 80.0–100.0)
Platelets: 232 10*3/uL (ref 150–440)
RBC: 3.89 MIL/uL (ref 3.80–5.20)
RDW: 13.9 % (ref 11.5–14.5)
WBC: 14.2 10*3/uL — AB (ref 3.6–11.0)

## 2015-01-22 LAB — RPR: RPR: NONREACTIVE

## 2015-01-22 MED ORDER — ONDANSETRON HCL 4 MG/2ML IJ SOLN: 4.0000 mg | INTRAMUSCULAR | Status: DC | PRN

## 2015-01-22 MED ORDER — DIBUCAINE 1 % RE OINT: 1.0000 "application " | TOPICAL_OINTMENT | RECTAL | Status: DC | PRN

## 2015-01-22 MED ORDER — ACETAMINOPHEN 325 MG PO TABS: 650.0000 mg | ORAL_TABLET | ORAL | Status: DC | PRN

## 2015-01-22 MED ORDER — SENNOSIDES-DOCUSATE SODIUM 8.6-50 MG PO TABS: 2.0000 | ORAL_TABLET | ORAL | Status: DC

## 2015-01-22 MED ORDER — IBUPROFEN 600 MG PO TABS
600.0000 mg | ORAL_TABLET | Freq: Four times a day (QID) | ORAL | Status: DC
  Administered 2015-01-22 – 2015-01-23 (×4): 600 mg via ORAL
  Filled 2015-01-22 (×4): qty 1

## 2015-01-22 MED ORDER — IBUPROFEN 600 MG PO TABS
ORAL_TABLET | ORAL | Status: AC
  Administered 2015-01-22: 05:00:00 via ORAL
  Filled 2015-01-22: qty 1

## 2015-01-22 MED ORDER — DIPHENHYDRAMINE HCL 25 MG PO CAPS: 25.0000 mg | ORAL_CAPSULE | Freq: Four times a day (QID) | ORAL | Status: DC | PRN

## 2015-01-22 MED ORDER — BENZOCAINE-MENTHOL 20-0.5 % EX AERO: 1.0000 "application " | INHALATION_SPRAY | CUTANEOUS | Status: DC | PRN

## 2015-01-22 MED ORDER — LANOLIN HYDROUS EX OINT: TOPICAL_OINTMENT | CUTANEOUS | Status: DC | PRN

## 2015-01-22 MED ORDER — SIMETHICONE 80 MG PO CHEW: 80.0000 mg | CHEWABLE_TABLET | ORAL | Status: DC | PRN

## 2015-01-22 MED ORDER — ONDANSETRON HCL 4 MG PO TABS
4.0000 mg | ORAL_TABLET | ORAL | Status: DC | PRN
  Filled 2015-01-22: qty 1

## 2015-01-22 MED ORDER — OXYCODONE-ACETAMINOPHEN 5-325 MG PO TABS
1.0000 | ORAL_TABLET | ORAL | Status: DC | PRN
  Administered 2015-01-22 – 2015-01-23 (×5): 1 via ORAL
  Filled 2015-01-22 (×5): qty 1

## 2015-01-22 MED ORDER — WITCH HAZEL-GLYCERIN EX PADS: 1.0000 "application " | MEDICATED_PAD | CUTANEOUS | Status: DC | PRN

## 2015-01-22 MED ORDER — OXYCODONE-ACETAMINOPHEN 5-325 MG PO TABS: 2.0000 | ORAL_TABLET | ORAL | Status: DC | PRN

## 2015-01-22 MED ORDER — PRENATAL MULTIVITAMIN CH
1.0000 | ORAL_TABLET | Freq: Every day | ORAL | Status: DC
  Administered 2015-01-22 – 2015-01-23 (×2): 1 via ORAL
  Filled 2015-01-22 (×3): qty 1

## 2015-01-22 NOTE — Discharge Summary (Signed)
Obstetric Discharge Summary Reason for Admission: induction of labor and mild preeclampsia Prenatal Procedures: NST and 24-hr urine collection Intrapartum Procedures: spontaneous vaginal delivery Postpartum Procedures: none Complications-Operative and Postpartum: none HEMOGLOBIN  Date Value Ref Range Status  01/22/2015 11.2* 12.0 - 16.0 g/dL Final   HCT  Date Value Ref Range Status  01/22/2015 33.8* 35.0 - 47.0 % Final    Physical Exam:  General: alert and cooperative Lochia: appropriate Uterine Fundus: firm Incision: none DVT Evaluation: No evidence of DVT seen on physical exam.  Discharge Diagnoses: Term Pregnancy-delivered and Preelampsia  Discharge Information: Date: 01/23/2015 Activity: unrestricted Diet: routine Medications: PNV and Ibuprofen Condition: stable Instructions: refer to practice specific booklet Discharge to: home Follow-up Information    Follow up with Dorthula Nettles, MD. Schedule an appointment as soon as possible for a visit in 1 week.   Specialty:  Obstetrics and Gynecology   Why:  for BP CHECK   Contact information:   [address] [place] [address] [phone]       Newborn Data: Live born female  Birth Weight: 6 lb 15.1 oz (3150 g) APGAR: 8, 9 Home with mother.  Shermaine Rivet PAUL 01/23/2015, 10:06 AM2

## 2015-01-22 NOTE — Anesthesia Postprocedure Evaluation (Signed)
  Anesthesia Post-op Note  Patient: Jillian Shepherd  Procedure(s) Performed: * No procedures listed *  Anesthesia type:Epidural  Patient location: PACU  Post pain: Pain level controlled  Post assessment: Post-op Vital signs reviewed, Patient's Cardiovascular Status Stable, Respiratory Function Stable, Patent Airway and No signs of Nausea or vomiting  Post vital signs: Reviewed and stable  Last Vitals:  Filed Vitals:   01/22/15 0611  BP: 122/78  Pulse: 88  Temp: 36.4 C  Resp: 16    Level of consciousness: awake, alert  and patient cooperative  Complications: No apparent anesthesia complications

## 2015-01-22 NOTE — Progress Notes (Signed)
Admit Date: 01/20/2015 Today's Date: 01/22/2015  Post Partum Day 0  Subjective:  no complaints, voiding, tolerating PO and denies h/a, blurry vision, epig pain, or edema.  BPs mostly normal range.  No problems reported w delivery.  Objective: Temp:  [97.5 F (36.4 C)-98.5 F (36.9 C)] 97.7 F (36.5 C) (06/18 0733) Pulse Rate:  [75-115] 81 (06/18 0733) Resp:  [16-22] 18 (06/18 0733) BP: (106-147)/(57-97) 139/84 mmHg (06/18 0733) SpO2:  [95 %-98 %] 95 % (06/18 0733) Weight:  [97.523 kg (215 lb)] 97.523 kg (215 lb) (06/17 2000)  Physical Exam:  General: alert and cooperative Lochia: appropriate Uterine Fundus: firm Incision: none DVT Evaluation: No evidence of DVT seen on physical exam.   Recent Labs  01/19/15 1511 01/21/15 1450  HGB 12.4 12.9  HCT 36.5 39.1    Assessment/Plan: Breastfeeding, Contraception (plans IUD) and Infant doing well   LOS: 1 day   [place] 01/22/2015, 9:08 AM

## 2015-01-23 MED ORDER — IBUPROFEN 600 MG PO TABS: 600.0000 mg | ORAL_TABLET | Freq: Four times a day (QID) | ORAL | Status: DC

## 2015-01-23 NOTE — Discharge Instructions (Signed)
Preeclampsia and Eclampsia Preeclampsia is a serious condition that develops only during pregnancy or immediately postpartum. It is also called toxemia of pregnancy. This condition causes high blood pressure along with other symptoms, such as swelling and headaches. These may develop as the condition gets worse. Preeclampsia may occur 20 weeks or later into your pregnancy.  Diagnosing and treating preeclampsia early is very important. If not treated early, it can cause serious problems for you and your baby. One problem it can lead to is eclampsia, which is a condition that causes muscle jerking or shaking (convulsions) in the mother. Delivering your baby is the best treatment for preeclampsia or eclampsia.  RISK FACTORS The cause of preeclampsia is not known. You may be more likely to develop preeclampsia if you have certain risk factors. These include:   Being pregnant for the first time.  Having preeclampsia in a past pregnancy.  Having a family history of preeclampsia.  Having high blood pressure.  Being pregnant with twins or triplets.  Being 22 or older.  Being African American.  Having kidney disease or diabetes.  Having medical conditions such as lupus or blood diseases.  Being very overweight (obese). SIGNS AND SYMPTOMS  The earliest signs of preeclampsia are:  High blood pressure.  Increased protein in your urine. Your health care provider will check for this at every prenatal visit. Other symptoms that can develop include:   Severe headaches.  Sudden weight gain.  Swelling of your hands, face, legs, and feet.  Feeling sick to your stomach (nauseous) and throwing up (vomiting).  Vision problems (blurred or double vision).  Numbness in your face, arms, legs, and feet.  Dizziness.  Slurred speech.  Sensitivity to bright lights.  Abdominal pain. DIAGNOSIS  There are no screening tests for preeclampsia. Your health care provider will ask you about symptoms  and check for signs of preeclampsia during your prenatal visits. You may also have tests, including:  Urine testing.  Blood testing.  Checking your baby's heart rate.  Checking the health of your baby and your placenta using images created with sound waves (ultrasound). TREATMENT  You can work out the best treatment approach together with your health care provider. It is very important to keep all prenatal appointments. If you have an increased risk of preeclampsia, you may need more frequent prenatal exams.  Your health care provider may prescribe bed rest.  You may have to eat as little salt as possible.  You may need to take medicine to lower your blood pressure if the condition does not respond to more conservative measures.  You may need to stay in the hospital if your condition is severe. There, treatment will focus on controlling your blood pressure and fluid retention. You may also need to take medicine to prevent seizures.  If the condition gets worse, your baby may need to be delivered early to protect you and the baby. You may have your labor started with medicine (be induced), or you may have a cesarean delivery.  Preeclampsia usually goes away after the baby is born. HOME CARE INSTRUCTIONS   Only take over-the-counter or prescription medicines as directed by your health care provider.  Lie on your left side while resting. This keeps pressure off your baby.  Elevate your feet while resting.  Get regular exercise. Ask your health care provider what type of exercise is safe for you.  Avoid caffeine and alcohol.  Do not smoke.  Drink 6-8 glasses of water every day.  Eat  a balanced diet that is low in salt. Do not add salt to your food.  Avoid stressful situations as much as possible.  Get plenty of rest and sleep.  Keep all prenatal appointments and tests as scheduled. SEEK MEDICAL CARE IF:  You are gaining more weight than expected.  You have any headaches,  abdominal pain, or nausea.  You are bruising more than usual.  You feel dizzy or light-headed. SEEK IMMEDIATE MEDICAL CARE IF:   You develop sudden or severe swelling anywhere in your body. This usually happens in the legs.  You gain 5 lb (2.3 kg) or more in a week.  You have a severe headache, dizziness, problems with your vision, or confusion.  You have severe abdominal pain.  You have lasting nausea or vomiting.  You have a seizure.  You have trouble moving any part of your body.  You develop numbness in your body.  You have trouble speaking.  You have any abnormal bleeding.  You develop a stiff neck.  You pass out. MAKE SURE YOU:   Understand these instructions.  Will watch your condition.  Will get help right away if you are not doing well or get worse. Document Released: 07/20/2000 Document Revised: 07/28/2013 Document Reviewed: 05/15/2013 [facility] Patient Information 2015 [place], [place]. This information is not intended to replace advice given to you by your health care provider. Make sure you discuss any questions you have with your health care provider.  Postpartum Care After Vaginal Delivery  After you deliver your newborn (postpartum period), the usual stay in the hospital is 24-72 hours. If there were problems with your labor or delivery, or if you have other medical problems, you might be in the hospital longer.  While you are in the hospital, you will receive help and instructions on how to care for yourself and your newborn during the postpartum period.  While you are in the hospital:  Be sure to tell your nurses if you have pain or discomfort, as well as where you feel the pain and what makes the pain worse.  If you had an incision made near your vagina (episiotomy) or if you had some tearing during delivery, the nurses may put ice packs on your episiotomy or tear. The ice packs may help to reduce the pain and swelling.  If you are breastfeeding, you  may feel uncomfortable contractions of your uterus for a couple of weeks. This is normal. The contractions help your uterus get back to normal size.  It is normal to have some bleeding after delivery.  For the first 1-3 days after delivery, the flow is red and the amount may be similar to a period.  It is common for the flow to start and stop.  In the first few days, you may pass some small clots. Let your nurses know if you begin to pass large clots or your flow increases.  Do not  flush blood clots down the toilet before having the nurse look at them.  During the next 3-10 days after delivery, your flow should become more watery and pink or brown-tinged in color.  Ten to fourteen days after delivery, your flow should be a small amount of yellowish-white discharge.  The amount of your flow will decrease over the first few weeks after delivery. Your flow may stop in 6-8 weeks. Most women have had their flow stop by 12 weeks after delivery.  You should change your sanitary pads frequently.  Wash your hands thoroughly with  soap and water for at least 20 seconds after changing pads, using the toilet, or before holding or feeding your newborn.  You should feel like you need to empty your bladder within the first 6-8 hours after delivery.  In case you become weak, lightheaded, or faint, call your nurse before you get out of bed for the first time and before you take a shower for the first time.  Within the first few days after delivery, your breasts may begin to feel tender and full. This is called engorgement. Breast tenderness usually goes away within 48-72 hours after engorgement occurs. You may also notice milk leaking from your breasts. If you are not breastfeeding, do not stimulate your breasts. Breast stimulation can make your breasts produce more milk.  Spending as much time as possible with your newborn is very important. During this time, you and your newborn can feel close and get  to know each other. Having your newborn stay in your room (rooming in) will help to strengthen the bond with your newborn. It will give you time to get to know your newborn and become comfortable caring for your newborn.  Your hormones change after delivery. Sometimes the hormone changes can temporarily cause you to feel sad or tearful. These feelings should not last more than a few days. If these feelings last longer than that, you should talk to your caregiver.  If desired, talk to your caregiver about methods of family planning or contraception.  Talk to your caregiver about immunizations. Your caregiver may want you to have the following immunizations before leaving the hospital:  Tetanus, diphtheria, and pertussis (Tdap) or tetanus and diphtheria (Td) immunization. It is very important that you and your family (including grandparents) or others caring for your newborn are up-to-date with the Tdap or Td immunizations. The Tdap or Td immunization can help protect your newborn from getting ill.  Rubella immunization.  Varicella (chickenpox) immunization.  Influenza immunization. You should receive this annual immunization if you did not receive the immunization during your pregnancy. Document Released: 05/20/2007 Document Revised: 04/16/2012 Document Reviewed: 03/19/2012 [facility] Patient Information 2015 [place], [place]. This information is not intended to replace advice given to you by your health care provider. Make sure you discuss any questions you have with your health care provider.

## 2015-01-23 NOTE — Progress Notes (Signed)
Admit Date: 01/20/2015 Today's Date: 01/23/2015  Post Partum Day 1  Subjective:  no complaints, voiding, tolerating PO and denies h/a, blurry vision, epig pain, or edema.  BPs mostly normal range.   Objective: Temp:  [97.5 F (36.4 C)-98.5 F (36.9 C)] 97.5 F (36.4 C) (06/19 0743) Pulse Rate:  [60-88] 70 (06/19 0743) Resp:  [16-18] 16 (06/19 0743) BP: (111-138)/(62-97) 131/74 mmHg (06/19 0940) SpO2:  [96 %-99 %] 99 % (06/19 0743)  Physical Exam:  General: alert and cooperative Lochia: appropriate Uterine Fundus: firm Incision: none DVT Evaluation: No evidence of DVT seen on physical exam.   Recent Labs  01/21/15 1450 01/22/15 0935  HGB 12.9 11.2*  HCT 39.1 33.8*    Assessment/Plan: Breastfeeding, Contraception (plans IUD) and Infant doing well   LOS: 2 days   [place] 01/23/2015, 10:07 AM

## 2015-02-04 ENCOUNTER — Encounter: Payer: Self-pay | Admitting: Emergency Medicine

## 2015-02-04 ENCOUNTER — Encounter (HOSPITAL_COMMUNITY): Payer: Self-pay

## 2016-04-25 ENCOUNTER — Ambulatory Visit (INDEPENDENT_AMBULATORY_CARE_PROVIDER_SITE_OTHER): Payer: Commercial Managed Care - HMO | Admitting: Physician Assistant

## 2016-04-25 ENCOUNTER — Encounter: Payer: Self-pay | Admitting: Physician Assistant

## 2016-04-25 VITALS — BP 140/86 | HR 79 | Temp 97.8°F | Resp 16 | Ht 63.0 in | Wt 222.0 lb

## 2016-04-25 DIAGNOSIS — R51 Headache: Secondary | ICD-10-CM | POA: Diagnosis not present

## 2016-04-25 DIAGNOSIS — Z0000 Encounter for general adult medical examination without abnormal findings: Secondary | ICD-10-CM

## 2016-04-25 DIAGNOSIS — Z7189 Other specified counseling: Secondary | ICD-10-CM | POA: Diagnosis not present

## 2016-04-25 DIAGNOSIS — L309 Dermatitis, unspecified: Secondary | ICD-10-CM

## 2016-04-25 DIAGNOSIS — R519 Headache, unspecified: Secondary | ICD-10-CM

## 2016-04-25 DIAGNOSIS — Z7689 Persons encountering health services in other specified circumstances: Secondary | ICD-10-CM
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
## 2016-05-01 ENCOUNTER — Encounter: Payer: Self-pay | Admitting: Physician Assistant

## 2016-05-01 ENCOUNTER — Other Ambulatory Visit: Payer: Commercial Managed Care - HMO

## 2016-05-01 DIAGNOSIS — Z0000 Encounter for general adult medical examination without abnormal findings: Secondary | ICD-10-CM

## 2016-05-01 LAB — CBC WITH DIFFERENTIAL/PLATELET
BASOS ABS: 0 {cells}/uL (ref 0–200)
Basophils Relative: 0 %
EOS PCT: 2 %
Eosinophils Absolute: 180 cells/uL (ref 15–500)
HCT: 43.5 % (ref 35.0–45.0)
HEMOGLOBIN: 14.5 g/dL (ref 12.0–15.0)
LYMPHS ABS: 3240 {cells}/uL (ref 850–3900)
Lymphocytes Relative: 36 %
MCH: 29.7 pg (ref 27.0–33.0)
MCHC: 33.3 g/dL (ref 32.0–36.0)
MCV: 89.1 fL (ref 80.0–100.0)
MPV: 9 fL (ref 7.5–12.5)
Monocytes Absolute: 360 cells/uL (ref 200–950)
Monocytes Relative: 4 %
Neutro Abs: 5220 cells/uL (ref 1500–7800)
Neutrophils Relative %: 58 %
Platelets: 299 10*3/uL (ref 140–400)
RBC: 4.88 MIL/uL (ref 3.80–5.10)
RDW: 14.1 % (ref 11.0–15.0)
WBC: 9 10*3/uL (ref 3.8–10.8)

## 2016-05-01 LAB — COMPLETE METABOLIC PANEL WITH GFR
ALBUMIN: 4 g/dL (ref 3.6–5.1)
ALK PHOS: 73 U/L (ref 33–115)
ALT: 15 U/L (ref 6–29)
AST: 15 U/L (ref 10–30)
BUN: 9 mg/dL (ref 7–25)
CALCIUM: 8.9 mg/dL (ref 8.6–10.2)
CO2: 23 mmol/L (ref 20–31)
Chloride: 103 mmol/L (ref 98–110)
Creat: 0.76 mg/dL (ref 0.50–1.10)
GFR, Est African American: >89 mL/min (ref >=60)
GLUCOSE: 66 mg/dL — AB (ref 70–99)
Potassium: 3.7 mmol/L (ref 3.5–5.3)
SODIUM: 139 mmol/L (ref 135–146)
Total Bilirubin: 0.5 mg/dL (ref 0.2–1.2)
Total Protein: 6.9 g/dL (ref 6.1–8.1)

## 2016-05-01 LAB — LIPID PANEL
Cholesterol: 153 mg/dL (ref 125–200)
HDL: 27 mg/dL — ABNORMAL LOW (ref >=46)
LDL Cholesterol: 98 mg/dL (ref <130)
TRIGLYCERIDES: 139 mg/dL (ref <150)
Total CHOL/HDL Ratio: 5.7 Ratio — ABNORMAL HIGH (ref <=5.0)
VLDL: 28 mg/dL (ref <30)

## 2016-05-01 LAB — TSH: TSH: 1.39 mIU/L

## 2016-05-23 ENCOUNTER — Ambulatory Visit: Payer: Commercial Managed Care - HMO | Admitting: Diagnostic Neuroimaging

## 2016-06-05 ENCOUNTER — Ambulatory Visit: Payer: Commercial Managed Care - HMO | Admitting: Diagnostic Neuroimaging

## 2016-09-03 DIAGNOSIS — L738 Other specified follicular disorders: Secondary | ICD-10-CM | POA: Diagnosis not present

## 2016-09-03 DIAGNOSIS — D239 Other benign neoplasm of skin, unspecified: Secondary | ICD-10-CM | POA: Diagnosis not present

## 2016-10-12 DIAGNOSIS — J069 Acute upper respiratory infection, unspecified: Secondary | ICD-10-CM | POA: Diagnosis not present

## 2016-10-22 DIAGNOSIS — L738 Other specified follicular disorders: Secondary | ICD-10-CM | POA: Diagnosis not present

## 2016-10-22 DIAGNOSIS — R21 Rash and other nonspecific skin eruption: Secondary | ICD-10-CM | POA: Diagnosis not present

## 2016-10-22 DIAGNOSIS — L309 Dermatitis, unspecified: Secondary | ICD-10-CM | POA: Diagnosis not present

## 2016-10-30 DIAGNOSIS — R21 Rash and other nonspecific skin eruption: Secondary | ICD-10-CM | POA: Diagnosis not present

## 2016-10-30 DIAGNOSIS — L509 Urticaria, unspecified: Secondary | ICD-10-CM | POA: Diagnosis not present

## 2016-11-13 DIAGNOSIS — R21 Rash and other nonspecific skin eruption: Secondary | ICD-10-CM | POA: Diagnosis not present

## 2016-11-28 ENCOUNTER — Ambulatory Visit: Payer: Commercial Managed Care - HMO | Admitting: Physician Assistant

## 2016-12-12 ENCOUNTER — Ambulatory Visit: Payer: Commercial Managed Care - HMO | Admitting: Physician Assistant

## 2017-07-09 ENCOUNTER — Ambulatory Visit: Payer: 59 | Admitting: Family Medicine

## 2017-07-09 ENCOUNTER — Encounter: Payer: Self-pay | Admitting: Family Medicine

## 2017-07-09 VITALS — BP 130/98 | HR 76 | Temp 98.1°F | Resp 18 | Ht 63.0 in | Wt 218.0 lb

## 2017-07-09 DIAGNOSIS — S93601A Unspecified sprain of right foot, initial encounter: Secondary | ICD-10-CM | POA: Diagnosis not present

## 2017-07-09 DIAGNOSIS — L309 Dermatitis, unspecified: Secondary | ICD-10-CM | POA: Diagnosis not present

## 2017-07-09 DIAGNOSIS — L03313 Cellulitis of chest wall: Secondary | ICD-10-CM

## 2017-07-09 MED ORDER — DOXYCYCLINE HYCLATE 100 MG PO TABS: 100.0000 mg | ORAL_TABLET | Freq: Two times a day (BID) | ORAL | 0 refills | Status: DC

## 2017-07-09 NOTE — Progress Notes (Signed)
Subjective:    Patient ID: Jillian Shepherd, female    DOB: 1985-10-16, 31 y.o.   MRN: [id number]  HPI Patient presents with 3 concerns.  #1 she reports pain in her right foot.  Pain is located in the midfoot just distal to the ankle.  Compression on either side of the foot elicits pain.  There is no bruising.  There is no swelling.  Pain has been present for several days.  Patient denies any specific injury.  She has normal range of motion in the ankle but does report stiffness and mild pain when I do dorsiflex the ankle.  She is able to bear weight however this elicits soreness.  Problem #2 is a red wound on her right breast.  It is roughly the size of a silver dollar.  It began with a white head and then started draining purulent material.  The surrounding skin is now erythematous and warm.  She has a history of staph infections.  It has been present for several days.  She also reports that the surrounding breast is tender however there is no fluctuance.  There is no warmth other than the area described above.  Problem #3 is a rash on her face that has been present for years.  She has seen numerous dermatologist and had several biopsies with no conclusive results.  She is tried topical steroid creams with no benefit.  The rash is a erythematous papular rash which is primarily located on both cheeks around the nasal bridge occasionally on her temple area.  Papules are 2-3 mm in size.  There are 3 isolated areas that almost appear to be urticarial hives which are roughly 5 mm in size.  Rash comes and goes without exacerbating or alleviating factors Past Medical History:  Diagnosis Date  . Obesity (BMI 35.0-39.9 without comorbidity)    Past Surgical History:  Procedure Laterality Date  . ADENOIDECTOMY    . TONSILLECTOMY     Current Outpatient Medications on File Prior to Visit  Medication Sig Dispense Refill  . ibuprofen (ADVIL,MOTRIN) 200 MG tablet Take 400 mg by mouth every 6 (six) hours as  needed. For pain     . levonorgestrel (MIRENA) 20 MCG/24HR IUD 1 each by Intrauterine route once.       No current facility-administered medications on file prior to visit.    No Known Allergies Social History   Socioeconomic History  . Marital status: Married    Spouse name: Not on file  . Number of children: Not on file  . Years of education: Not on file  . Highest education level: Not on file  Social Needs  . Financial resource strain: Not on file  . Food insecurity - worry: Not on file  . Food insecurity - inability: Not on file  . Transportation needs - medical: Not on file  . Transportation needs - non-medical: Not on file  Occupational History  . Not on file  Tobacco Use  . Smoking status: Current Every Day Smoker    Packs/day: 0.50    Years: 9.00    Pack years: 4.50    Types: Cigarettes  . Smokeless tobacco: Never Used  Substance and Sexual Activity  . Alcohol use: No    Alcohol/week: 1.2 oz    Types: 2 Cans of beer per week  . Drug use: No  . Sexual activity: Yes    Birth control/protection: None, IUD  Other Topics Concern  . Not on file  Social History Narrative   **  Merged History Encounter **          Review of Systems  All other systems reviewed and are negative.      Objective:   Physical Exam  Constitutional: She appears well-developed and well-nourished.  HENT:  Head:    Cardiovascular: Normal rate, regular rhythm and normal heart sounds.  Pulmonary/Chest: Effort normal and breath sounds normal. No respiratory distress. She has no wheezes. She has no rales.  Abdominal: Soft. Bowel sounds are normal.  Musculoskeletal:       Right foot: There is tenderness and bony tenderness. There is no swelling, no crepitus and no deformity.       Feet:  Skin: Rash noted. There is erythema.  Vitals reviewed.         Assessment & Plan:  Sprain of right foot, initial encounter - Plan: DG Foot Complete Right  Cellulitis of chest  wall  Dermatitis  I believe the patient has sprained her midfoot.  I have recommended an x-ray be obtained to rule out a fracture.  If the x-ray is negative, I would recommend compressive therapy wearing good supportive tennis shoes, taking over-the-counter anti-inflammatory medication such as ibuprofen, and staying off her feet as much as possible I believe the spot on her breast suggest cellulitis most likely from staph.  I will treat this with doxycycline 100 mg p.o. twice daily for 10 days.  I believe the rash on her face may be some type of atypical rosacea.  I am hopeful that the doxycycline which I will give her for 2 weeks to help clear this up as well.  If she sees dramatic response to the doxycycline, we can continue her long-term on a reduced dose.  Pain should gradually improve over 2-3 weeks

## 2017-09-27 ENCOUNTER — Ambulatory Visit: Payer: 59 | Admitting: Family Medicine

## 2017-09-27 ENCOUNTER — Encounter: Payer: Self-pay | Admitting: Family Medicine

## 2017-09-27 ENCOUNTER — Other Ambulatory Visit: Payer: Self-pay

## 2017-09-27 ENCOUNTER — Telehealth: Payer: Self-pay | Admitting: *Deleted

## 2017-09-27 VITALS — BP 132/84 | HR 80 | Temp 98.2°F | Resp 14 | Ht 63.0 in | Wt 216.0 lb

## 2017-09-27 DIAGNOSIS — J208 Acute bronchitis due to other specified organisms: Secondary | ICD-10-CM | POA: Diagnosis not present

## 2017-09-27 DIAGNOSIS — J0100 Acute maxillary sinusitis, unspecified: Secondary | ICD-10-CM

## 2017-09-27 DIAGNOSIS — H66003 Acute suppurative otitis media without spontaneous rupture of ear drum, bilateral: Secondary | ICD-10-CM | POA: Diagnosis not present

## 2017-09-27 MED ORDER — PREDNISONE 10 MG PO TABS: ORAL_TABLET | ORAL | 0 refills | Status: DC

## 2017-09-27 MED ORDER — ALBUTEROL SULFATE HFA 108 (90 BASE) MCG/ACT IN AERS: 2.0000 | INHALATION_SPRAY | RESPIRATORY_TRACT | 0 refills | Status: DC | PRN

## 2017-09-27 MED ORDER — PREDNISONE 10 MG PO TABS
ORAL_TABLET | ORAL | 0 refills | Status: DC
Start: 2017-09-27 — End: 2017-09-27

## 2017-09-27 MED ORDER — AMOXICILLIN 875 MG PO TABS: 875.0000 mg | ORAL_TABLET | Freq: Two times a day (BID) | ORAL | 0 refills | Status: DC

## 2017-09-27 MED ORDER — GUAIFENESIN-CODEINE 100-10 MG/5ML PO SOLN: 5.0000 mL | Freq: Four times a day (QID) | ORAL | 0 refills | Status: DC | PRN

## 2017-09-27 NOTE — Patient Instructions (Signed)
Take antibiotics Take prednisone Use inhaler Cough medicine at bedtime F/U as needed

## 2017-09-27 NOTE — Progress Notes (Signed)
   Subjective:    Patient ID: Jillian Shepherd, female    DOB: 02-27-1986, 32 y.o.   MRN: [id number]  Patient presents for Illness (x2 weeks- chest congestion, sinus pressure, ear pressure, decreased hearing in B ears, productive cough with yellow mucus, nasal drainage)  Pt here with illness for past 2-3 weeks. Nasal congestion, sinus pressure, bilateral ear pain had a popping sensation the other day, difficult to hear as well, cough with mild production has wheezing at night Taking alka seltzer, goodys , tylenol cold and sinus  Subjective fever in the beginning, decreased appetite  No diarrhea, + nausea  Flu shot done at work Works at [employer] 10 cig/day   Review Of Systems:  GEN- denies fatigue, fever, weight loss,weakness, recent illness HEENT- denies eye drainage, change in vision, +nasal discharge, CVS- denies chest pain, palpitations RESP- denies SOB,+ cough, +wheeze ABD- denies N/V, change in stools, abd pain GU- denies dysuria, hematuria, dribbling, incontinence MSK- denies joint pain, muscle aches, injury Neuro- denies headache,+ dizziness, syncope, seizure activity       Objective:    BP 132/84   Pulse 80   Temp 98.2 F (36.8 C) (Oral)   Resp 14   Ht 5\' 3"  (1.6 m)   Wt 216 lb (98 kg)   SpO2 96%   BMI 38.26 kg/m  GEN- NAD, alert and oriented x3 HEENT- PERRL, EOMI, non injected sclera, pink conjunctiva, MMM, oropharynx mild injection, TM  Effusion bilat, erythema Right, tense membrane left, canals clear ,  + mild maxillary sinus tenderness, inflammed turbinates,  Nasal drainage  Neck- Supple, no LAD CVS- RRR, no murmur RESP-bilat wheeze, rhonchi bilat, normal WOB at rest, normal sat  EXT- No edema Pulses- Radial 2+        Assessment & Plan:      Problem List Items Addressed This Visit    None    Visit Diagnoses    Acute bronchitis due to other specified organisms    -  Primary   Treat with amoxicillin, robitussin codiene, prednisone, albuterol  prn. Fluids with medications, work note for today   Acute suppurative otitis media of both ears without spontaneous rupture of tympanic membranes, recurrence not specified       Relevant Medications   amoxicillin (AMOXIL) 875 MG tablet   Acute maxillary sinusitis, recurrence not specified       Relevant Medications   predniSONE (DELTASONE) 10 MG tablet   amoxicillin (AMOXIL) 875 MG tablet   guaiFENesin-codeine 100-10 MG/5ML syrup      Note: This dictation was prepared with Dragon dictation along with smaller phrase technology. Any transcriptional errors that result from this process are unintentional.

## 2017-09-27 NOTE — Telephone Encounter (Signed)
Patient returned to clinic and requested that all prescriptions be transferred to [facility] in [place] as her spouse does not want to drive to [place] today.   Ok to re-send?

## 2017-09-29 NOTE — Telephone Encounter (Signed)
I cannot tell whether this has been addressed by Dr. Buelah Manis. Send to Dr. Buelah Manis.

## 2017-09-30 NOTE — Telephone Encounter (Signed)
Medications re-sent after visit.

## 2018-08-12 ENCOUNTER — Encounter: Payer: Self-pay | Admitting: Family Medicine

## 2018-08-12 ENCOUNTER — Ambulatory Visit: Payer: 59 | Admitting: Family Medicine

## 2018-08-12 VITALS — BP 150/94 | HR 90 | Temp 98.0°F | Resp 16 | Ht 64.0 in | Wt 218.0 lb

## 2018-08-12 DIAGNOSIS — R110 Nausea: Secondary | ICD-10-CM

## 2018-08-12 DIAGNOSIS — R1013 Epigastric pain: Secondary | ICD-10-CM | POA: Diagnosis not present

## 2018-08-12 MED ORDER — ONDANSETRON 4 MG PO TBDP: 4.0000 mg | ORAL_TABLET | Freq: Three times a day (TID) | ORAL | 0 refills | Status: DC | PRN

## 2018-08-12 MED ORDER — PANTOPRAZOLE SODIUM 40 MG PO TBEC: 40.0000 mg | DELAYED_RELEASE_TABLET | Freq: Every day | ORAL | 3 refills | Status: DC

## 2018-08-12 NOTE — Progress Notes (Signed)
Subjective:    Patient ID: Jillian Shepherd, female    DOB: November 07, 1985, 33 y.o.   MRN: [id number]  HPI  Patient states for the last 2 weeks she has been nauseated.  She denies any vomiting.  She denies any fever.  She denies any diarrhea.  No one in her family is sick.  They have all been eating the same food.  However she has very little appetite and feels sick to her stomach constantly.  Nothing makes it better.  Nothing makes it worse.  She has performed 3 home pregnancy tests which have all been negative and she has an IUD.  She denies any headache.  She denies any blurry vision.  She denies any chest pain.  She denies any fever.  She has been having more indigestion recently.  She reports wet burps.  She reports some chest discomfort like heartburn that occurred 1 time last week.  She denies any melena.  She denies any hematochezia.  She denies any dysuria or hematuria.  She denies any vaginal discharge.  She does report polyuria.  She also reports polydipsia.  She denies any recent weight loss or blurry vision. Past Medical History:  Diagnosis Date  . Obesity (BMI 35.0-39.9 without comorbidity)    Past Surgical History:  Procedure Laterality Date  . ADENOIDECTOMY    . TONSILLECTOMY     Current Outpatient Medications on File Prior to Visit  Medication Sig Dispense Refill  . levonorgestrel (MIRENA) 20 MCG/24HR IUD 1 each by Intrauterine route once.       No current facility-administered medications on file prior to visit.    No Known Allergies Social History   Socioeconomic History  . Marital status: Married    Spouse name: Not on file  . Number of children: Not on file  . Years of education: Not on file  . Highest education level: Not on file  Occupational History  . Not on file  Social Needs  . Financial resource strain: Not on file  . Food insecurity:    Worry: Not on file    Inability: Not on file  . Transportation needs:    Medical: Not on file    Non-medical: Not on  file  Tobacco Use  . Smoking status: Current Every Day Smoker    Packs/day: 0.50    Years: 9.00    Pack years: 4.50    Types: Cigarettes  . Smokeless tobacco: Never Used  Substance and Sexual Activity  . Alcohol use: No    Alcohol/week: 2.0 standard drinks    Types: 2 Cans of beer per week  . Drug use: No  . Sexual activity: Yes    Birth control/protection: None, I.U.D.  Lifestyle  . Physical activity:    Days per week: Not on file    Minutes per session: Not on file  . Stress: Not on file  Relationships  . Social connections:    Talks on phone: Not on file    Gets together: Not on file    Attends religious service: Not on file    Active member of club or organization: Not on file    Attends meetings of clubs or organizations: Not on file    Relationship status: Not on file  . Intimate partner violence:    Fear of current or ex partner: Not on file    Emotionally abused: Not on file    Physically abused: Not on file    Forced sexual activity: Not on file  Other Topics Concern  . Not on file  Social History Narrative   ** Merged History Encounter **         Review of Systems  All other systems reviewed and are negative.      Objective:   Physical Exam Vitals signs reviewed.  Constitutional:      General: She is not in acute distress.    Appearance: Normal appearance. She is obese. She is not ill-appearing, toxic-appearing or diaphoretic.  HENT:     Right Ear: Tympanic membrane and ear canal normal.     Left Ear: Tympanic membrane and ear canal normal.     Nose: Nose normal. No congestion or rhinorrhea.     Mouth/Throat:     Pharynx: Oropharynx is clear. No oropharyngeal exudate or posterior oropharyngeal erythema.  Eyes:     General: No scleral icterus.    Conjunctiva/sclera: Conjunctivae normal.  Neck:     Musculoskeletal: Neck supple.  Cardiovascular:     Rate and Rhythm: Normal rate and regular rhythm.     Pulses: Normal pulses.     Heart sounds:  Normal heart sounds. No murmur. No friction rub. No gallop.   Pulmonary:     Effort: Pulmonary effort is normal. No respiratory distress.     Breath sounds: Normal breath sounds. No stridor. No wheezing, rhonchi or rales.  Chest:     Chest wall: No tenderness.  Abdominal:     General: Abdomen is flat. Bowel sounds are normal. There is no distension.     Palpations: Abdomen is soft. There is no mass.     Tenderness: There is no abdominal tenderness. There is no right CVA tenderness, left CVA tenderness, guarding or rebound.     Hernia: No hernia is present.  Neurological:     Mental Status: She is alert.           Assessment & Plan:  Nausea in adult - Plan: CBC with Differential/Platelet, COMPLETE METABOLIC PANEL WITH GFR, Lipase  Dyspepsia - Plan: CBC with Differential/Platelet, COMPLETE METABOLIC PANEL WITH GFR, Lipase  Not certain of the cause of her symptoms.  Her exam is reassuring.  Diagnosis includes viral gastroenteritis although it is gone on for 2 weeks with no vomiting or diarrhea so this seems less likely.  Is possible this could be psychogenic.  Is possible this could be due to dyspepsia from acid reflux or possibly gastritis.  She is having normal bowel movement so I doubt any obstruction.  She denies any pain so I doubt biliary dyskinesia or cholelithiasis.  She denies any other symptoms of infection.  Therefore I am going to treat the patient empirically with Zofran 4 mg every 8 hours as needed for nausea to help with symptoms.  I encouraged her to push fluids to avoid dehydration such as Gatorade.  I will treat presumptively for dyspepsia with Protonix 40 mg a day and then recheck the patient next week to see if she is doing better.  She is to return immediately if she is getting worse.  Meanwhile I will check a CBC to evaluate for leukocytosis or other signs of infection.  I will check a CMP to rule out electrolyte abnormalities that can cause nausea such as hyponatremia or  hyperglycemia.  I will check her liver and kidney function.  I will check a lipase to check for pancreatitis although her abdomen is soft and benign.  Reassess next week.  I offered the patient another pregnancy test however she politely  declined having already to perform 3 that were negative this week.

## 2018-08-13 LAB — COMPLETE METABOLIC PANEL WITH GFR
AG RATIO: 1.6 (calc) (ref 1.0–2.5)
ALT: 15 U/L (ref 6–29)
AST: 16 U/L (ref 10–30)
Albumin: 4.4 g/dL (ref 3.6–5.1)
Alkaline phosphatase (APISO): 80 U/L (ref 33–115)
BUN: 8 mg/dL (ref 7–25)
CALCIUM: 9.3 mg/dL (ref 8.6–10.2)
CO2: 24 mmol/L (ref 20–32)
CREATININE: 0.62 mg/dL (ref 0.50–1.10)
Chloride: 105 mmol/L (ref 98–110)
GFR, Est African American: 137 mL/min/{1.73_m2} (ref >=60)
GFR, Est Non African American: 118 mL/min/{1.73_m2} (ref >=60)
Globulin: 2.8 g/dL (calc) (ref 1.9–3.7)
Glucose, Bld: 98 mg/dL (ref 65–99)
POTASSIUM: 3.6 mmol/L (ref 3.5–5.3)
SODIUM: 138 mmol/L (ref 135–146)
Total Bilirubin: 0.5 mg/dL (ref 0.2–1.2)
Total Protein: 7.2 g/dL (ref 6.1–8.1)

## 2018-08-13 LAB — CBC WITH DIFFERENTIAL/PLATELET
Absolute Monocytes: 341 cells/uL (ref 200–950)
BASOS ABS: 28 {cells}/uL (ref 0–200)
Basophils Relative: 0.4 %
EOS PCT: 2.3 %
Eosinophils Absolute: 163 cells/uL (ref 15–500)
HEMATOCRIT: 49.4 % — AB (ref 35.0–45.0)
Hemoglobin: 17.1 g/dL — ABNORMAL HIGH (ref 11.7–15.5)
LYMPHS ABS: 2734 {cells}/uL (ref 850–3900)
MCH: 32.7 pg (ref 27.0–33.0)
MCHC: 34.6 g/dL (ref 32.0–36.0)
MCV: 94.5 fL (ref 80.0–100.0)
MONOS PCT: 4.8 %
MPV: 9.3 fL (ref 7.5–12.5)
Neutro Abs: 3834 cells/uL (ref 1500–7800)
Neutrophils Relative %: 54 %
Platelets: 295 10*3/uL (ref 140–400)
RBC: 5.23 10*6/uL — ABNORMAL HIGH (ref 3.80–5.10)
RDW: 12.9 % (ref 11.0–15.0)
Total Lymphocyte: 38.5 %
WBC: 7.1 10*3/uL (ref 3.8–10.8)

## 2018-08-13 LAB — LIPASE: Lipase: 22 U/L (ref 7–60)

## 2018-09-01 ENCOUNTER — Encounter: Payer: Self-pay | Admitting: Obstetrics and Gynecology

## 2018-09-01 ENCOUNTER — Other Ambulatory Visit (HOSPITAL_COMMUNITY)
Admission: RE | Admit: 2018-09-01 | Discharge: 2018-09-01 | Disposition: A | Discharge status: Home / Self Care | Payer: 59 | Source: Ambulatory Visit | Attending: Obstetrics and Gynecology | Admitting: Obstetrics and Gynecology

## 2018-09-01 ENCOUNTER — Ambulatory Visit (INDEPENDENT_AMBULATORY_CARE_PROVIDER_SITE_OTHER): Payer: 59 | Admitting: Obstetrics and Gynecology

## 2018-09-01 ENCOUNTER — Ambulatory Visit (INDEPENDENT_AMBULATORY_CARE_PROVIDER_SITE_OTHER): Payer: 59

## 2018-09-01 VITALS — BP 150/100 | HR 95 | Ht 63.0 in | Wt 220.0 lb

## 2018-09-01 DIAGNOSIS — Z1151 Encounter for screening for human papillomavirus (HPV): Secondary | ICD-10-CM | POA: Insufficient documentation

## 2018-09-01 DIAGNOSIS — Z30431 Encounter for routine checking of intrauterine contraceptive device: Secondary | ICD-10-CM

## 2018-09-01 DIAGNOSIS — Z124 Encounter for screening for malignant neoplasm of cervix: Secondary | ICD-10-CM

## 2018-09-01 DIAGNOSIS — R1032 Left lower quadrant pain: Secondary | ICD-10-CM

## 2018-09-01 DIAGNOSIS — B9689 Other specified bacterial agents as the cause of diseases classified elsewhere: Secondary | ICD-10-CM | POA: Diagnosis not present

## 2018-09-01 DIAGNOSIS — N83201 Unspecified ovarian cyst, right side: Secondary | ICD-10-CM

## 2018-09-01 DIAGNOSIS — N760 Acute vaginitis: Secondary | ICD-10-CM

## 2018-09-01 LAB — POCT WET PREP WITH KOH
Clue Cells Wet Prep HPF POC: POSITIVE
KOH PREP POC: POSITIVE — AB
Trichomonas, UA: NEGATIVE
Yeast Wet Prep HPF POC: NEGATIVE

## 2018-09-01 MED ORDER — METRONIDAZOLE 500 MG PO TABS: 500.0000 mg | ORAL_TABLET | Freq: Two times a day (BID) | ORAL | 0 refills | Status: AC

## 2018-09-01 NOTE — Progress Notes (Signed)
[name], MD   Chief Complaint  Patient presents with  . IUD check    hasnt felt strings in the last month, has had a vaginal odor for a month, no discharge/itchiness/irritation, has had left side pelvic pain for a month, has done 2 pregnancy test within the last month 2 wks apart from each other    HPI:      Ms. Jillian Shepherd is a 33 y.o. [id number] who LMP was No LMP recorded. (Menstrual status: IUD)., presents today for NP>3 yrs eval of LLQ pain for the past month. Sx are sharp and fleeting, occurring a couple times a day. No aggrav, allev factors. Has had several net UPTs. Hx of constipation in past month with recent hemorrhoid, treated with prep H with sx improvement. Has also had nausea and headaches. Saw PCP about a month ago. No fevers, urin sx.  Has also had vaginal odor for the past month. No increased d/c, irritation. Husband noticing during sex. Has Mirena, placed 9/16. Usually feels strings but can't now. Pt is amenorrheic with IUD, except occas spotting/cramping. No new sex partners.   Pt is past due for pap/annual.   Past Medical History:  Diagnosis Date  . Obesity (BMI 35.0-39.9 without comorbidity)     Past Surgical History:  Procedure Laterality Date  . ADENOIDECTOMY    . TONSILLECTOMY      Family History  Problem Relation Age of Onset  . Melanoma Paternal Grandmother        of skin  . Hypertension Father   . Anxiety disorder Father   . Depression Father     Social History   Socioeconomic History  . Marital status: Married    Spouse name: Not on file  . Number of children: Not on file  . Years of education: Not on file  . Highest education level: Not on file  Occupational History  . Not on file  Social Needs  . Financial resource strain: Not on file  . Food insecurity:    Worry: Not on file    Inability: Not on file  . Transportation needs:    Medical: Not on file    Non-medical: Not on file  Tobacco Use  . Smoking status: Current  Every Day Smoker    Packs/day: 0.50    Years: 9.00    Pack years: 4.50    Types: Cigarettes  . Smokeless tobacco: Never Used  Substance and Sexual Activity  . Alcohol use: No    Alcohol/week: 2.0 standard drinks    Types: 2 Cans of beer per week  . Drug use: No  . Sexual activity: Yes    Birth control/protection: I.U.D.    Comment: Mirena  Lifestyle  . Physical activity:    Days per week: Not on file    Minutes per session: Not on file  . Stress: Not on file  Relationships  . Social connections:    Talks on phone: Not on file    Gets together: Not on file    Attends religious service: Not on file    Active member of club or organization: Not on file    Attends meetings of clubs or organizations: Not on file    Relationship status: Not on file  . Intimate partner violence:    Fear of current or ex partner: Not on file    Emotionally abused: Not on file    Physically abused: Not on file    Forced sexual activity: Not  on file  Other Topics Concern  . Not on file  Social History Narrative   ** Merged History Encounter **        Outpatient Medications Prior to Visit  Medication Sig Dispense Refill  . levonorgestrel (MIRENA) 20 MCG/24HR IUD 1 each by Intrauterine route once.      . ondansetron (ZOFRAN ODT) 4 MG disintegrating tablet Take 1 tablet (4 mg total) by mouth every 8 (eight) hours as needed for nausea or vomiting. 20 tablet 0  . pantoprazole (PROTONIX) 40 MG tablet Take 1 tablet (40 mg total) by mouth daily. 30 tablet 3   No facility-administered medications prior to visit.       ROS:  Review of Systems  Constitutional: Positive for fatigue. Negative for fever and unexpected weight change.  Respiratory: Negative for cough, shortness of breath and wheezing.   Cardiovascular: Negative for chest pain, palpitations and leg swelling.  Gastrointestinal: Positive for constipation and nausea. Negative for blood in stool, diarrhea and vomiting.  Endocrine: Negative  for cold intolerance, heat intolerance and polyuria.  Genitourinary: Positive for pelvic pain. Negative for dyspareunia, dysuria, flank pain, frequency, genital sores, hematuria, menstrual problem, urgency, vaginal bleeding, vaginal discharge and vaginal pain.  Musculoskeletal: Negative for back pain, joint swelling and myalgias.  Skin: Negative for rash.  Neurological: Positive for headaches. Negative for dizziness, syncope, light-headedness and numbness.  Hematological: Negative for adenopathy.  Psychiatric/Behavioral: Negative for agitation, confusion, sleep disturbance and suicidal ideas. The patient is not nervous/anxious.    OBJECTIVE:   Vitals:  BP (!) 150/100   Pulse 95   Ht 5\' 3"  (1.6 m)   Wt 220 lb (99.8 kg)   BMI 38.97 kg/m   Physical Exam Vitals signs reviewed.  Constitutional:      Appearance: She is well-developed.  Pulmonary:     Effort: Pulmonary effort is normal.  Abdominal:     Palpations: Abdomen is soft.     Tenderness: There is abdominal tenderness in the left lower quadrant.  Genitourinary:    Pubic Area: No rash.      Labia:        Right: No rash, tenderness or lesion.        Left: No rash, tenderness or lesion.      Vagina: Normal. No vaginal discharge, erythema or tenderness.     Cervix: Normal.     Uterus: Normal. Not enlarged and not tender.      Adnexa: Right adnexa normal.       Right: No mass or tenderness.         Left: Tenderness present. No mass.       Comments: IUD STRINGS IN CX OS Musculoskeletal: Normal range of motion.  Neurological:     Mental Status: She is alert and oriented to person, place, and time.  Psychiatric:        Behavior: Behavior normal.        Thought Content: Thought content normal.     Results: Results for orders placed or performed in visit on 09/01/18 (from the past 24 hour(s))  POCT Wet Prep with KOH     Status: Abnormal   Collection Time: 09/01/18 11:59 AM  Result Value Ref Range   Trichomonas, UA Negative     Clue Cells Wet Prep HPF POC POS    Epithelial Wet Prep HPF POC     Yeast Wet Prep HPF POC NEG    Bacteria Wet Prep HPF POC     RBC Wet Prep  HPF POC     WBC Wet Prep HPF POC     KOH Prep POC Positive (A) Negative   ULTRASOUND REPORT  Location: [facility]  Date of Service: 09/01/2018    Indications:Pelvic Pain Findings:  The uterus is anteverted and measures 8.4 x 4.5 x 3.9cm. Echo texture is homogenous without evidence of focal masses.  The Endometrium measures 12.0 mm. IUD in place.   Right Ovary measures 3.7 x 3.4 x 3.0cm with simple cyst measuring 2.8 x 2.7 x 2.6cm. Left Ovary measures 3.9 x 2.7 x 2.2cm with dominant follicle measuring 1.7CB. Survey of the adnexa demonstrates no adnexal masses. There is no free fluid in the cul de sac.  Impression: 1. Simple right ovarian cyst, otherwise normal gyn ultrasound.   Recommendations: 1.Clinical correlation with the patient's History and Physical Exam.  Vita Barley, RDMS RVT  Assessment/Plan: LLQ pain - Tender on exam/sx intermittent. Small RTO ovar cyst and LT follicle on GYN exam. Rechk in 8 wks. Reassurance. F/u prn.  - Plan: US PELVIS TRANSVANGINAL NON-OB (TV ONLY), US PELVIS TRANSVANGINAL NON-OB (TV ONLY)  Right ovarian cyst - Plan: US PELVIS TRANSVANGINAL NON-OB (TV ONLY)  Encounter for routine checking of intrauterine contraceptive device (IUD) - IUD strings in place, IUD in correct location on u/s. Due for rem 9/21.  Bacterial vaginosis - Pos wet prep/sx. Rx flagyl. No EtOH. will RF if sx recur. - Plan: metroNIDAZOLE (FLAGYL) 500 MG tablet, POCT Wet Prep with KOH  Cervical cancer screening - Plan: Cytology - PAP  Screening for HPV (human papillomavirus) - Plan: Cytology - PAP    Meds ordered this encounter  Medications  . metroNIDAZOLE (FLAGYL) 500 MG tablet    Sig: Take 1 tablet (500 mg total) by mouth 2 (two) times daily for 7 days.    Dispense:  14 tablet    Refill:  0    Order Specific  Question:   Supervising Provider    Answer:   Gae Dry [[id number]]      Return in about 8 weeks (around 10/27/2018) for GYN u/s for bilat ovar cysts--[facility] to call pt.  Destry Dauber B. Santina Trillo, PA-C 09/02/2018 11:40 AM

## 2018-09-02 ENCOUNTER — Encounter: Payer: Self-pay | Admitting: Obstetrics and Gynecology

## 2018-09-02 DIAGNOSIS — N83202 Unspecified ovarian cyst, left side: Secondary | ICD-10-CM

## 2018-09-02 DIAGNOSIS — N83201 Unspecified ovarian cyst, right side: Secondary | ICD-10-CM | POA: Insufficient documentation

## 2018-09-02 LAB — CYTOLOGY - PAP: Diagnosis: NEGATIVE

## 2018-09-02 NOTE — Patient Instructions (Signed)
I value your feedback and entrusting us with your care. If you get a [facility] patient survey, I would appreciate you taking the time to let us know about your experience today. Thank you! 

## 2018-09-16 ENCOUNTER — Other Ambulatory Visit: Payer: Self-pay | Admitting: Family Medicine

## 2018-09-16 MED ORDER — OSELTAMIVIR PHOSPHATE 75 MG PO CAPS: 75.0000 mg | ORAL_CAPSULE | Freq: Two times a day (BID) | ORAL | 0 refills | Status: DC

## 2018-09-16 NOTE — Progress Notes (Signed)
Sons and husband have the flu Given prophylaxis Husband in office today for OV

## 2018-09-19 ENCOUNTER — Telehealth: Payer: Self-pay | Admitting: Obstetrics and Gynecology

## 2018-09-19 NOTE — Telephone Encounter (Signed)
Alicia Copland placed orders for a f/u ultrasound for this patient. Lmtrc.

## 2018-10-07 NOTE — Addendum Note (Signed)
Addended by: Ardeth Perfect B on: 01/05/1946 04:50 PM   Modules accepted: Orders

## 2018-10-27 ENCOUNTER — Ambulatory Visit: Admission: RE | Admit: 2018-10-27 | Payer: 59 | Source: Ambulatory Visit

## 2018-10-27 ENCOUNTER — Other Ambulatory Visit: Payer: 59

## 2019-05-04 ENCOUNTER — Ambulatory Visit: Payer: 59 | Admitting: Family Medicine

## 2019-05-04 ENCOUNTER — Encounter: Payer: Self-pay | Admitting: Family Medicine

## 2019-05-04 VITALS — BP 130/80 | HR 86 | Temp 98.6°F | Resp 17 | Ht 64.0 in | Wt 226.0 lb

## 2019-05-04 DIAGNOSIS — M79631 Pain in right forearm: Secondary | ICD-10-CM

## 2019-05-04 DIAGNOSIS — R2231 Localized swelling, mass and lump, right upper limb: Secondary | ICD-10-CM | POA: Diagnosis not present

## 2019-05-04 DIAGNOSIS — Z23 Encounter for immunization: Secondary | ICD-10-CM | POA: Diagnosis not present

## 2019-05-04 MED ORDER — METHYLPREDNISOLONE 4 MG PO TBPK: ORAL_TABLET | ORAL | 0 refills | Status: DC

## 2019-05-04 NOTE — Patient Instructions (Addendum)
Take steroids  We will call with lab results  F/U pending results

## 2019-05-04 NOTE — Progress Notes (Signed)
   Subjective:    Patient ID: Jillian Shepherd, female    DOB: [birth year], 33 y.o.   MRN: [id number]  Patient presents for Arm Pain (Patient in with c/o cramping pain in forearm)  Right forearm pain for the past 5 days, Feels knots on the arms, feels like swelling just above wrist, and in hands Occ numbness in fingers, feels like she has to shake hands out If she turns her hand and arm she has more discomfort No injury to arm, no bruising, nobug  bites on arm  Took tylenol and ibuprofen helped a little  Tried ice pak      Review Of Systems:  GEN- denies fatigue, fever, weight loss,weakness, recent illness HEENT- denies eye drainage, change in vision, nasal discharge, CVS- denies chest pain, palpitations RESP- denies SOB, cough, wheeze ABD- denies N/V, change in stools, abd pain GU- denies dysuria, hematuria, dribbling, incontinence MSK- + joint pain, muscle aches, injury Neuro- denies headache, dizziness, syncope, seizure activity       Objective:    BP 130/80   Pulse 86   Temp 98.6 F (37 C) (Oral)   Resp 17   Ht 5\' 4"  (1.626 m)   Wt 226 lb (102.5 kg)   SpO2 97%   BMI 38.79 kg/m  GEN- NAD, alert and oriented x3 HEENT- PERRL, EOMI, non injected sclera, pink conjunctiva, MMM, oropharynx clear Neck- Supple, FROm, neg spurlings  CVS- RRR, no murmur RESP-CTAB MSK- FROM upper ext bilat, FROM right below, hand, normal grasp, small lumps palpated in 3 spots along forearm, mild TTP, NT at olecranon, mild swelling in right hand No erythema, no rash   Neuro- neg tinel's phalens, neg prayer sign, normal monofilament, normal tone UE and strength       Assessment & Plan:      Problem List Items Addressed This Visit    None    Visit Diagnoses    Right forearm pain    -  Primary   unclear cause of acute swelling, no sign of typical DVT in upper ext, no trauma, no sign of infection, check CBC, D DIMER, CK, START prednisone for inflammation Consider carpal tunnel, but no  specific reason for the swollen area Possible myositis in fore arm  If labs unremarkable, no response to steroids, obtain US of arm   Relevant Orders   CBC with Differential/Platelet (Completed)   D-dimer, quantitative (not at [facility]) (Completed)   CK (Completed)   Need for immunization against influenza       Relevant Orders   Flu Vaccine QUAD 36+ mos IM (Completed)   Localized swelling of right forearm       Relevant Orders   CBC with Differential/Platelet (Completed)   D-dimer, quantitative (not at [facility]) (Completed)   CK (Completed)      Note: This dictation was prepared with Dragon dictation along with smaller phrase technology. Any transcriptional errors that result from this process are unintentional.

## 2019-05-05 ENCOUNTER — Encounter: Payer: Self-pay | Admitting: Family Medicine

## 2019-05-05 LAB — CBC WITH DIFFERENTIAL/PLATELET
Absolute Monocytes: 439 cells/uL (ref 200–950)
Basophils Absolute: 39 cells/uL (ref 0–200)
Basophils Relative: 0.5 %
Eosinophils Absolute: 408 cells/uL (ref 15–500)
Eosinophils Relative: 5.3 %
HCT: 45.3 % — ABNORMAL HIGH (ref 35.0–45.0)
Hemoglobin: 15.5 g/dL (ref 11.7–15.5)
Lymphs Abs: 2595 cells/uL (ref 850–3900)
MCH: 33.3 pg — ABNORMAL HIGH (ref 27.0–33.0)
MCHC: 34.2 g/dL (ref 32.0–36.0)
MCV: 97.4 fL (ref 80.0–100.0)
MPV: 9.8 fL (ref 7.5–12.5)
Monocytes Relative: 5.7 %
Neutro Abs: 4220 cells/uL (ref 1500–7800)
Neutrophils Relative %: 54.8 %
Platelets: 265 10*3/uL (ref 140–400)
RBC: 4.65 10*6/uL (ref 3.80–5.10)
RDW: 13 % (ref 11.0–15.0)
Total Lymphocyte: 33.7 %
WBC: 7.7 10*3/uL (ref 3.8–10.8)

## 2019-05-05 LAB — D-DIMER, QUANTITATIVE: D-Dimer, Quant: 0.44 mcg/mL FEU (ref <0.50)

## 2019-05-05 LAB — CK: Total CK: 64 U/L (ref 29–143)

## 2019-05-08 ENCOUNTER — Telehealth: Payer: Self-pay | Admitting: Family Medicine

## 2019-05-08 ENCOUNTER — Ambulatory Visit: Payer: MEDICAID

## 2019-05-08 DIAGNOSIS — M79631 Pain in right forearm: Secondary | ICD-10-CM

## 2019-05-08 DIAGNOSIS — R229 Localized swelling, mass and lump, unspecified: Secondary | ICD-10-CM

## 2019-05-08 NOTE — Telephone Encounter (Signed)
Patient is now scheduled for Monday at 90 at [facility] since she could not go today due to work

## 2019-05-08 NOTE — Telephone Encounter (Signed)
OKAY THAT IS FINE

## 2019-05-08 NOTE — Telephone Encounter (Signed)
I was able to get patient scheduled for today at [facility] [place]. I notified patient of appointment and she informed me that she is working today and can not go will get ultrasound scheduled for Monday.

## 2019-05-08 NOTE — Telephone Encounter (Signed)
Ultrasound ordered to evaluation soft tissue

## 2019-05-11 ENCOUNTER — Ambulatory Visit: Payer: 59

## 2019-05-13 ENCOUNTER — Ambulatory Visit: Payer: 59

## 2019-09-14 DIAGNOSIS — M533 Sacrococcygeal disorders, not elsewhere classified: Secondary | ICD-10-CM | POA: Insufficient documentation

## 2020-01-29 ENCOUNTER — Encounter: Payer: Self-pay | Admitting: Obstetrics and Gynecology

## 2020-01-29 ENCOUNTER — Other Ambulatory Visit: Payer: Self-pay

## 2020-01-29 ENCOUNTER — Ambulatory Visit (INDEPENDENT_AMBULATORY_CARE_PROVIDER_SITE_OTHER): Payer: 59 | Admitting: Obstetrics and Gynecology

## 2020-01-29 VITALS — BP 134/96 | Ht 64.0 in | Wt 235.0 lb

## 2020-01-29 DIAGNOSIS — Z30432 Encounter for removal of intrauterine contraceptive device: Secondary | ICD-10-CM

## 2020-01-29 NOTE — Progress Notes (Signed)
   GYNECOLOGY OFFICE PROCEDURE NOTE  Jillian Shepherd is a 34 y.o. [id number] here for Mirena IUD removal placed September 2016. She desires removal secondary to recommendation to her dermatologist secondary to her continued skin rash.  IUD Removal  Patient identified, informed consent performed, consent signed.  Patient was in the dorsal lithotomy position, normal external genitalia was noted.  A speculum was placed in the patient's vagina, normal discharge was noted, no lesions. The cervix was visualized, no lesions, no abnormal discharge.  The strings of the IUD were grasped and pulled using ring forceps. The IUD was removed in its entirety. Patient tolerated the procedure well.    Patient declines alternate mode of contraception at present.  Routine preventative health maintenance measures emphasized.   Malachy Mood, MD, [facility], [place]

## 2020-02-12 ENCOUNTER — Ambulatory Visit: Payer: 59 | Admitting: Family Medicine

## 2020-02-19 ENCOUNTER — Ambulatory Visit: Payer: 59 | Admitting: Family Medicine

## 2020-04-08 ENCOUNTER — Other Ambulatory Visit: Payer: Self-pay

## 2020-04-08 ENCOUNTER — Ambulatory Visit (INDEPENDENT_AMBULATORY_CARE_PROVIDER_SITE_OTHER): Payer: 59 | Admitting: Family Medicine

## 2020-04-08 VITALS — BP 140/90 | HR 103 | Temp 98.1°F | Ht 64.0 in | Wt 231.0 lb

## 2020-04-08 DIAGNOSIS — R1013 Epigastric pain: Secondary | ICD-10-CM | POA: Diagnosis not present

## 2020-04-08 MED ORDER — PANTOPRAZOLE SODIUM 40 MG PO TBEC: 40.0000 mg | DELAYED_RELEASE_TABLET | Freq: Two times a day (BID) | ORAL | 3 refills | Status: DC

## 2020-04-08 NOTE — Progress Notes (Signed)
Subjective:    Patient ID: Jillian Shepherd, female    DOB: 1985/09/18, 34 y.o.   MRN: [id number]  HPI  Patient is a very pleasant 34 year old Caucasian female who presents today with epigastric abdominal pain.  When asked to describe the pain, she balls up her hand into a fist and holds it in the epigastric area below the xiphoid process.  She reports it is a burning pain radiating around to her back.  Is made worse by food particularly spicy food or soft drinks.  She denies any melena or hematochezia.  She denies any constipation.  She denies any diarrhea.  She states that when she does have a bowel movement is light brown and "frothy".  When I asked her to describe it more she states that there are air bubbles in the stool.  It does not float in the bowl.  It is not acholic.  She denies any pain in the right upper quadrant.  There is no tenderness to palpation in the right upper quadrant however she is tender to palpation in the epigastric area.  She denies any chest pain or shortness of breath or pleurisy.  She denies any fevers or chills.  She denies any hematemesis.  She states that she drinks alcohol occasionally but denies any heavy alcohol use.  She has no history of pancreatitis Past Medical History:  Diagnosis Date  . Obesity (BMI 35.0-39.9 without comorbidity)    Past Surgical History:  Procedure Laterality Date  . ADENOIDECTOMY    . TONSILLECTOMY     No current outpatient medications on file prior to visit.   No current facility-administered medications on file prior to visit.   No Known Allergies Social History   Socioeconomic History  . Marital status: Married    Spouse name: Not on file  . Number of children: Not on file  . Years of education: Not on file  . Highest education level: Not on file  Occupational History  . Not on file  Tobacco Use  . Smoking status: Current Every Day Smoker    Packs/day: 0.50    Years: 9.00    Pack years: 4.50    Types: Cigarettes  .  Smokeless tobacco: Never Used  Vaping Use  . Vaping Use: Never used  Substance and Sexual Activity  . Alcohol use: No    Alcohol/week: 2.0 standard drinks    Types: 2 Cans of beer per week  . Drug use: No  . Sexual activity: Yes    Birth control/protection: None  Other Topics Concern  . Not on file  Social History Narrative   ** Merged History Encounter **       Social Determinants of Health   Financial Resource Strain:   . Difficulty of Paying Living Expenses: Not on file  Food Insecurity:   . Worried About Charity fundraiser in the Last Year: Not on file  . Ran Out of Food in the Last Year: Not on file  Transportation Needs:   . Lack of Transportation (Medical): Not on file  . Lack of Transportation (Non-Medical): Not on file  Physical Activity:   . Days of Exercise per Week: Not on file  . Minutes of Exercise per Session: Not on file  Stress:   . Feeling of Stress : Not on file  Social Connections:   . Frequency of Communication with Friends and Family: Not on file  . Frequency of Social Gatherings with Friends and Family: Not on file  .  Attends Religious Services: Not on file  . Active Member of Clubs or Organizations: Not on file  . Attends Archivist Meetings: Not on file  . Marital Status: Not on file  Intimate Partner Violence:   . Fear of Current or Ex-Partner: Not on file  . Emotionally Abused: Not on file  . Physically Abused: Not on file  . Sexually Abused: Not on file     Review of Systems  All other systems reviewed and are negative.      Objective:   Physical Exam Vitals reviewed.  Constitutional:      General: She is not in acute distress.    Appearance: Normal appearance. She is obese. She is not ill-appearing, toxic-appearing or diaphoretic.  HENT:     Nose: Nose normal. No congestion or rhinorrhea.     Mouth/Throat:     Pharynx: Oropharynx is clear. No oropharyngeal exudate or posterior oropharyngeal erythema.  Eyes:      General: No scleral icterus.    Conjunctiva/sclera: Conjunctivae normal.  Cardiovascular:     Rate and Rhythm: Normal rate and regular rhythm.     Pulses: Normal pulses.     Heart sounds: Normal heart sounds. No murmur heard.  No friction rub. No gallop.   Pulmonary:     Effort: Pulmonary effort is normal. No respiratory distress.     Breath sounds: Normal breath sounds. No stridor. No wheezing, rhonchi or rales.  Chest:     Chest wall: No tenderness.  Abdominal:     General: Abdomen is flat. Bowel sounds are decreased. There is no distension.     Palpations: Abdomen is soft. There is no mass.     Tenderness: There is abdominal tenderness in the epigastric area. There is no right CVA tenderness, left CVA tenderness, guarding or rebound.     Hernia: No hernia is present.    Musculoskeletal:     Cervical back: Neck supple.  Neurological:     Mental Status: She is alert.           Assessment & Plan:  Epigastric pain - Plan: CBC with Differential/Platelet, COMPLETE METABOLIC PANEL WITH GFR, Lipase  Differential diagnosis includes gastritis/peptic ulcer disease versus pancreatitis versus biliary colic less likely.  Doubt bowel obstruction.  Treat gastritis/peptic ulcer disease with Protonix 40 mg twice daily.  Avoid NSAIDs.  Avoid alcohol.  Avoid spicy food.  Can use Tylenol with pain.  Obtain CBC, CMP, lipase.  Recommended clear liquids only in case this is pancreatitis.  Slowly advance diet from Gatorade and chicken broth to Jell-O to soft foods such as mashed potatoes and bananas slowly and gradually as pain will allow.  Recheck next week or seek medical attention immediately if worsening

## 2020-04-09 LAB — COMPLETE METABOLIC PANEL WITH GFR
AG Ratio: 1.1 (calc) (ref 1.0–2.5)
ALT: 49 U/L — ABNORMAL HIGH (ref 6–29)
AST: 74 U/L — ABNORMAL HIGH (ref 10–30)
Albumin: 4 g/dL (ref 3.6–5.1)
Alkaline phosphatase (APISO): 88 U/L (ref 31–125)
BUN: 8 mg/dL (ref 7–25)
CO2: 24 mmol/L (ref 20–32)
Calcium: 9.2 mg/dL (ref 8.6–10.2)
Chloride: 101 mmol/L (ref 98–110)
Creat: 0.82 mg/dL (ref 0.50–1.10)
GFR, Est African American: 107 mL/min/{1.73_m2} (ref >=60)
GFR, Est Non African American: 93 mL/min/{1.73_m2} (ref >=60)
Globulin: 3.5 g/dL (calc) (ref 1.9–3.7)
Glucose, Bld: 167 mg/dL — ABNORMAL HIGH (ref 65–99)
Potassium: 3.3 mmol/L — ABNORMAL LOW (ref 3.5–5.3)
Sodium: 139 mmol/L (ref 135–146)
Total Bilirubin: 0.5 mg/dL (ref 0.2–1.2)
Total Protein: 7.5 g/dL (ref 6.1–8.1)

## 2020-04-09 LAB — CBC WITH DIFFERENTIAL/PLATELET
Absolute Monocytes: 277 cells/uL (ref 200–950)
Basophils Absolute: 63 cells/uL (ref 0–200)
Basophils Relative: 0.8 %
Eosinophils Absolute: 427 cells/uL (ref 15–500)
Eosinophils Relative: 5.4 %
HCT: 41.2 % (ref 35.0–45.0)
Hemoglobin: 14.5 g/dL (ref 11.7–15.5)
Lymphs Abs: 2678 cells/uL (ref 850–3900)
MCH: 36.1 pg — ABNORMAL HIGH (ref 27.0–33.0)
MCHC: 35.2 g/dL (ref 32.0–36.0)
MCV: 102.5 fL — ABNORMAL HIGH (ref 80.0–100.0)
MPV: 10.2 fL (ref 7.5–12.5)
Monocytes Relative: 3.5 %
Neutro Abs: 4456 cells/uL (ref 1500–7800)
Neutrophils Relative %: 56.4 %
Platelets: 240 10*3/uL (ref 140–400)
RBC: 4.02 10*6/uL (ref 3.80–5.10)
RDW: 13.1 % (ref 11.0–15.0)
Total Lymphocyte: 33.9 %
WBC: 7.9 10*3/uL (ref 3.8–10.8)

## 2020-04-09 LAB — LIPASE: Lipase: 93 U/L — ABNORMAL HIGH (ref 7–60)

## 2020-04-12 ENCOUNTER — Other Ambulatory Visit: Payer: Self-pay | Admitting: Family Medicine

## 2020-04-12 ENCOUNTER — Other Ambulatory Visit: Payer: Self-pay | Admitting: *Deleted

## 2020-04-12 DIAGNOSIS — K8500 Idiopathic acute pancreatitis without necrosis or infection: Secondary | ICD-10-CM

## 2020-04-13 ENCOUNTER — Ambulatory Visit (HOSPITAL_COMMUNITY): Payer: MEDICAID

## 2020-04-13 ENCOUNTER — Other Ambulatory Visit: Payer: Self-pay

## 2020-04-13 ENCOUNTER — Ambulatory Visit
Admission: RE | Admit: 2020-04-13 | Discharge: 2020-04-13 | Disposition: A | Discharge status: Home / Self Care | Payer: 59 | Source: Ambulatory Visit | Attending: Family Medicine | Admitting: Family Medicine

## 2020-04-13 DIAGNOSIS — K8500 Idiopathic acute pancreatitis without necrosis or infection: Secondary | ICD-10-CM

## 2020-05-09 ENCOUNTER — Ambulatory Visit (HOSPITAL_COMMUNITY): Payer: 59

## 2020-05-09 ENCOUNTER — Other Ambulatory Visit: Payer: Self-pay

## 2020-05-09 ENCOUNTER — Encounter: Payer: Self-pay | Admitting: Family Medicine

## 2020-05-09 ENCOUNTER — Ambulatory Visit (INDEPENDENT_AMBULATORY_CARE_PROVIDER_SITE_OTHER): Payer: 59 | Admitting: Family Medicine

## 2020-05-09 VITALS — BP 150/98 | HR 99 | Temp 98.0°F | Ht 64.0 in | Wt 223.0 lb

## 2020-05-09 DIAGNOSIS — R1013 Epigastric pain: Secondary | ICD-10-CM

## 2020-05-09 DIAGNOSIS — K859 Acute pancreatitis without necrosis or infection, unspecified: Secondary | ICD-10-CM | POA: Diagnosis not present

## 2020-05-09 MED ORDER — SUCRALFATE 1 G PO TABS: 1.0000 g | ORAL_TABLET | Freq: Three times a day (TID) | ORAL | 3 refills | Status: DC

## 2020-05-09 MED ORDER — MAGNESIUM CITRATE PO SOLN: 1.0000 | Freq: Once | ORAL | 1 refills | Status: AC

## 2020-05-09 MED ORDER — HYDROCODONE-ACETAMINOPHEN 5-325 MG PO TABS
1.0000 | ORAL_TABLET | Freq: Four times a day (QID) | ORAL | 0 refills | Status: DC | PRN
Start: 2020-05-09 — End: 2020-07-27

## 2020-05-09 NOTE — Progress Notes (Signed)
Subjective:    Patient ID: Jillian Shepherd, female    DOB: 15-Feb-1986, 34 y.o.   MRN: [id number]  HPI 04/08/20 Patient is a very pleasant 34 year old Caucasian female who presents today with epigastric abdominal pain.  When asked to describe the pain, she balls up her hand into a fist and holds it in the epigastric area below the xiphoid process.  She reports it is a burning pain radiating around to her back.  Is made worse by food particularly spicy food or soft drinks.  She denies any melena or hematochezia.  She denies any constipation.  She denies any diarrhea.  She states that when she does have a bowel movement is light brown and "frothy".  When I asked her to describe it more she states that there are air bubbles in the stool.  It does not float in the bowl.  It is not acholic.  She denies any pain in the right upper quadrant.  There is no tenderness to palpation in the right upper quadrant however she is tender to palpation in the epigastric area.  She denies any chest pain or shortness of breath or pleurisy.  She denies any fevers or chills.  She denies any hematemesis.  She states that she drinks alcohol occasionally but denies any heavy alcohol use.  She has no history of pancreatitis.  At that time, my plan was: Differential diagnosis includes gastritis/peptic ulcer disease versus pancreatitis versus biliary colic less likely.  Doubt bowel obstruction.  Treat gastritis/peptic ulcer disease with Protonix 40 mg twice daily.  Avoid NSAIDs.  Avoid alcohol.  Avoid spicy food.  Can use Tylenol with pain.  Obtain CBC, CMP, lipase.  Recommended clear liquids only in case this is pancreatitis.  Slowly advance diet from Gatorade and chicken broth to Jell-O to soft foods such as mashed potatoes and bananas slowly and gradually as pain will allow.  Recheck next week or seek medical attention immediately if worsening  05/09/20 Last time, the patient's labs showed mild elevation in her liver function test as  well as a mild elevation in her lipase.  I was concerned about possible gallstone pancreatitis so I did obtain a right upper quadrant ultrasound.  However there were no gallstones or biliary pathology seen on the right upper quadrant ultrasound.  The patient states that with bowel rest and Protonix, the pain went away and she has been doing fine for the last month.  She is gradually advanced her diet back to normal and she was feeling fine.  This weekend she drank 2 beers and ate barbecue over the weekend.  She states that she did not drink excessively.  Starting yesterday she developed severe epigastric abdominal pain.  Today she is extremely tender to palpation in the epigastric area.  She states that any food makes it worse.  She denies any melena.  She denies any hematochezia.  She states that she has not had a bowel movement in 2 days.  She feels bloated.  On abdominal exam she has markedly diminished bowel sounds and her belly does feel somewhat distended.  She states that the pain got so bad last night that she thought about going to the emergency room but she thought she would try here first. Past Medical History:  Diagnosis Date  . Obesity (BMI 35.0-39.9 without comorbidity)    Past Surgical History:  Procedure Laterality Date  . ADENOIDECTOMY    . TONSILLECTOMY     Current Outpatient Medications on File Prior to  Visit  Medication Sig Dispense Refill  . pantoprazole (PROTONIX) 40 MG tablet Take 1 tablet (40 mg total) by mouth 2 (two) times daily. 60 tablet 3   No current facility-administered medications on file prior to visit.   No Known Allergies Social History   Socioeconomic History  . Marital status: Married    Spouse name: Not on file  . Number of children: Not on file  . Years of education: Not on file  . Highest education level: Not on file  Occupational History  . Not on file  Tobacco Use  . Smoking status: Current Every Day Smoker    Packs/day: 0.50    Years: 9.00     Pack years: 4.50    Types: Cigarettes  . Smokeless tobacco: Never Used  Vaping Use  . Vaping Use: Never used  Substance and Sexual Activity  . Alcohol use: No    Alcohol/week: 2.0 standard drinks    Types: 2 Cans of beer per week  . Drug use: No  . Sexual activity: Yes    Birth control/protection: None  Other Topics Concern  . Not on file  Social History Narrative   ** Merged History Encounter **       Social Determinants of Health   Financial Resource Strain:   . Difficulty of Paying Living Expenses: Not on file  Food Insecurity:   . Worried About Charity fundraiser in the Last Year: Not on file  . Ran Out of Food in the Last Year: Not on file  Transportation Needs:   . Lack of Transportation (Medical): Not on file  . Lack of Transportation (Non-Medical): Not on file  Physical Activity:   . Days of Exercise per Week: Not on file  . Minutes of Exercise per Session: Not on file  Stress:   . Feeling of Stress : Not on file  Social Connections:   . Frequency of Communication with Friends and Family: Not on file  . Frequency of Social Gatherings with Friends and Family: Not on file  . Attends Religious Services: Not on file  . Active Member of Clubs or Organizations: Not on file  . Attends Archivist Meetings: Not on file  . Marital Status: Not on file  Intimate Partner Violence:   . Fear of Current or Ex-Partner: Not on file  . Emotionally Abused: Not on file  . Physically Abused: Not on file  . Sexually Abused: Not on file     Review of Systems  Gastrointestinal: Positive for abdominal pain.  All other systems reviewed and are negative.      Objective:   Physical Exam Vitals reviewed.  Constitutional:      General: She is not in acute distress.    Appearance: Normal appearance. She is obese. She is not ill-appearing, toxic-appearing or diaphoretic.  HENT:     Nose: Nose normal. No congestion or rhinorrhea.     Mouth/Throat:     Pharynx:  Oropharynx is clear. No oropharyngeal exudate or posterior oropharyngeal erythema.  Eyes:     General: No scleral icterus.    Conjunctiva/sclera: Conjunctivae normal.  Cardiovascular:     Rate and Rhythm: Normal rate and regular rhythm.     Pulses: Normal pulses.     Heart sounds: Normal heart sounds. No murmur heard.  No friction rub. No gallop.   Pulmonary:     Effort: Pulmonary effort is normal. No respiratory distress.     Breath sounds: Normal breath sounds. No  stridor. No wheezing, rhonchi or rales.  Chest:     Chest wall: No tenderness.  Abdominal:     General: Abdomen is flat. Bowel sounds are decreased. There is no distension.     Palpations: Abdomen is soft. There is no mass.     Tenderness: There is abdominal tenderness in the epigastric area. There is no right CVA tenderness, left CVA tenderness, guarding or rebound.     Hernia: No hernia is present.    Musculoskeletal:     Cervical back: Neck supple.  Neurological:     Mental Status: She is alert.           Assessment & Plan:  Epigastric pain - Plan: CBC with Differential/Platelet, COMPLETE METABOLIC PANEL WITH GFR, Lipase, Lipid panel, sucralfate (CARAFATE) 1 g tablet, magnesium citrate SOLN  Differential diagnosis includes pancreatitis versus peptic ulcer disease versus bowel obstruction.  Patient does not drink alcohol sufficiently to explain pancreatitis.  However I am concerned that she may have high triglycerides that could cause pancreatitis given her Karlene Lineman seen on the right upper quadrant ultrasound.  Therefore I will check a fasting lipid panel.  I question if she has high triglycerides, eating barbecue and drinking beer over the weekend could have reaggravated the pancreas.  However this is just a guess.  I have recommended bowel rest.  I recommended that she only drink Gatorade and eat clears such as Jell-O.  She can take Norco 5/325 1 p.o. every 6 hours for moderate pain.  If the pain becomes intense she  should go to the emergency room immediately.  I will treat a bowel obstruction with magnesium citrate.  I question if the patient may be constipated due to the abdominal pain.  If she does not have a bowel movement with magnesium citrate and the pain worsens, I would recommend going to the emergency room to evaluate for possible small bowel obstruction.  I will consult GI for an EGD to look for peptic ulcer disease.  Meanwhile I will empirically add sucralfate 1 g p.o. q. ACH S.  I will also schedule a CT scan of the abdomen and pelvis to evaluate further for causes of pancreatitis as well as to evaluate for any evidence of a bowel obstruction.  Again, I cautioned the patient to go the emergency room if the pain worsens

## 2020-05-10 ENCOUNTER — Other Ambulatory Visit: Payer: Self-pay | Admitting: *Deleted

## 2020-05-10 ENCOUNTER — Ambulatory Visit
Admission: RE | Admit: 2020-05-10 | Discharge: 2020-05-10 | Disposition: A | Discharge status: Home / Self Care | Payer: 59 | Source: Ambulatory Visit | Attending: Family Medicine | Admitting: Family Medicine

## 2020-05-10 DIAGNOSIS — R1013 Epigastric pain: Secondary | ICD-10-CM | POA: Diagnosis not present

## 2020-05-10 DIAGNOSIS — K859 Acute pancreatitis without necrosis or infection, unspecified: Secondary | ICD-10-CM | POA: Insufficient documentation

## 2020-05-10 DIAGNOSIS — E876 Hypokalemia: Secondary | ICD-10-CM

## 2020-05-10 HISTORY — DX: Systemic involvement of connective tissue, unspecified: M35.9

## 2020-05-10 HISTORY — DX: Malignant (primary) neoplasm, unspecified: C80.1

## 2020-05-10 LAB — LIPID PANEL
Cholesterol: 180 mg/dL (ref <200)
HDL: 43 mg/dL — ABNORMAL LOW (ref >=50)
LDL Cholesterol (Calc): 111 mg/dL (calc) — ABNORMAL HIGH
Non-HDL Cholesterol (Calc): 137 mg/dL (calc) — ABNORMAL HIGH (ref <130)
Total CHOL/HDL Ratio: 4.2 (calc) (ref <5.0)
Triglycerides: 146 mg/dL (ref <150)

## 2020-05-10 LAB — CBC WITH DIFFERENTIAL/PLATELET
Absolute Monocytes: 296 cells/uL (ref 200–950)
Basophils Absolute: 31 cells/uL (ref 0–200)
Basophils Relative: 0.3 %
Eosinophils Absolute: 71 cells/uL (ref 15–500)
Eosinophils Relative: 0.7 %
HCT: 44.7 % (ref 35.0–45.0)
Hemoglobin: 15.4 g/dL (ref 11.7–15.5)
Lymphs Abs: 1958 cells/uL (ref 850–3900)
MCH: 35.6 pg — ABNORMAL HIGH (ref 27.0–33.0)
MCHC: 34.5 g/dL (ref 32.0–36.0)
MCV: 103.2 fL — ABNORMAL HIGH (ref 80.0–100.0)
MPV: 9.8 fL (ref 7.5–12.5)
Monocytes Relative: 2.9 %
Neutro Abs: 7844 cells/uL — ABNORMAL HIGH (ref 1500–7800)
Neutrophils Relative %: 76.9 %
Platelets: 188 10*3/uL (ref 140–400)
RBC: 4.33 10*6/uL (ref 3.80–5.10)
RDW: 13.4 % (ref 11.0–15.0)
Total Lymphocyte: 19.2 %
WBC: 10.2 10*3/uL (ref 3.8–10.8)

## 2020-05-10 LAB — COMPLETE METABOLIC PANEL WITH GFR
AG Ratio: 1.3 (calc) (ref 1.0–2.5)
ALT: 31 U/L — ABNORMAL HIGH (ref 6–29)
AST: 46 U/L — ABNORMAL HIGH (ref 10–30)
Albumin: 3.8 g/dL (ref 3.6–5.1)
Alkaline phosphatase (APISO): 85 U/L (ref 31–125)
BUN: 11 mg/dL (ref 7–25)
CO2: 27 mmol/L (ref 20–32)
Calcium: 9.1 mg/dL (ref 8.6–10.2)
Chloride: 99 mmol/L (ref 98–110)
Creat: 0.66 mg/dL (ref 0.50–1.10)
GFR, Est African American: 133 mL/min/{1.73_m2} (ref >=60)
GFR, Est Non African American: 114 mL/min/{1.73_m2} (ref >=60)
Globulin: 2.9 g/dL (calc) (ref 1.9–3.7)
Glucose, Bld: 120 mg/dL — ABNORMAL HIGH (ref 65–99)
Potassium: 2.6 mmol/L — CL (ref 3.5–5.3)
Sodium: 138 mmol/L (ref 135–146)
Total Bilirubin: 1.4 mg/dL — ABNORMAL HIGH (ref 0.2–1.2)
Total Protein: 6.7 g/dL (ref 6.1–8.1)

## 2020-05-10 LAB — MAGNESIUM: Magnesium: 1.5 mg/dL (ref 1.5–2.5)

## 2020-05-10 LAB — LIPASE: Lipase: 1966 U/L — ABNORMAL HIGH (ref 7–60)

## 2020-05-10 LAB — TEST AUTHORIZATION

## 2020-05-10 MED ORDER — IOHEXOL 300 MG/ML  SOLN
100.0000 mL | Freq: Once | INTRAMUSCULAR | Status: AC | PRN
  Administered 2020-05-10: 100 mL via INTRAVENOUS

## 2020-05-10 MED ORDER — POTASSIUM CHLORIDE CRYS ER 20 MEQ PO TBCR: 20.0000 meq | EXTENDED_RELEASE_TABLET | Freq: Two times a day (BID) | ORAL | 0 refills | Status: DC

## 2020-05-11 ENCOUNTER — Telehealth: Payer: Self-pay

## 2020-05-11 NOTE — Telephone Encounter (Signed)
[facility] called with a call report, Inflammation and Fluid at the base line of Pancrease.

## 2020-05-12 NOTE — Telephone Encounter (Signed)
Results have been read.

## 2020-05-23 ENCOUNTER — Encounter: Payer: Self-pay | Admitting: *Deleted

## 2020-07-27 ENCOUNTER — Ambulatory Visit: Payer: 59 | Admitting: Gastroenterology

## 2020-07-27 ENCOUNTER — Encounter: Payer: Self-pay | Admitting: Gastroenterology

## 2020-07-27 VITALS — BP 169/95 | HR 108 | Temp 98.1°F | Ht 64.0 in | Wt 212.1 lb

## 2020-07-27 DIAGNOSIS — K859 Acute pancreatitis without necrosis or infection, unspecified: Secondary | ICD-10-CM | POA: Insufficient documentation

## 2020-07-27 DIAGNOSIS — Z8719 Personal history of other diseases of the digestive system: Secondary | ICD-10-CM

## 2020-07-27 DIAGNOSIS — K858 Other acute pancreatitis without necrosis or infection: Secondary | ICD-10-CM

## 2020-07-27 NOTE — Progress Notes (Signed)
Jillian Darby, MD [address]  [place]  [place], [place] [address]  Main: [phone]  Fax: [phone]    Gastroenterology Consultation  Referring Provider:     [name], MD Primary Care Physician:  [name], MD Primary Gastroenterologist:  Dr. Cephas Shepherd Reason for Consultation:     History of acute pancreatitis, epigastric pain        HPI:   Jillian Shepherd is a 34 y.o. female referred by Dr. [name], MD  for consultation & management of chronic epigastric pain.  Patient reports 2 months history of epigastric pain, that originally started in early September 2021.  She was diagnosed with acute pancreatitis in early October 21.  Her serum lipase was significantly elevated to 1966 on 05/09/2020, mildly elevated transaminases, total bilirubin 1.4, ALP 85.  She pretty much managed at home by strict dietary modification and she lost about 20 pounds since then.  She was initially evaluated for epigastric pain by her PCP on 04/08/2020, severe knot-like pain radiating to the back associated with nausea, diarrhea.  Her serum lipase was 93 on 04/08/2020.  She was also treated for possible peptic ulcer disease.  However, her pain got worse in October, with significant elevated lipase and so she underwent CT scan which revealed uniform enhancement of the pancreas with no evidence of pancreatic edema or pancreatic ductal dilation.  There was presence of significant retroperitoneal fluid along the tail of pancreas and extending along the left paracolic gutter.  There was no evidence of biliary obstruction or dilation.  Fatty liver as well as normal gallbladder  Her main concern today is dull epigastric pain, sometimes worse after eating and severe nonbloody diarrhea, anywhere from 3-6 times daily.  She denies any nocturnal diarrhea.  She reports that her diarrhea slows down if she does not eat.  She has strong history of smoking tobacco since age of 74, 2 packs/day,  currently 1 pack/day.  She does drink 12 ounces of beer up to 4 times a week  She is currently on Protonix 40 twice daily, sucralfate  NSAIDs: None  Antiplts/Anticoagulants/Anti thrombotics: None  GI Procedures: None She denies family history of pancreatic cancer, hereditary pancreatitis, GI malignancy  Past Medical History:  Diagnosis Date  . Cancer ([place])    squmaous cell skin cancer   . Collagen vascular disease ([place])   . Obesity (BMI 35.0-39.9 without comorbidity)     Past Surgical History:  Procedure Laterality Date  . ADENOIDECTOMY    . TONSILLECTOMY      Current Outpatient Medications:  .  pantoprazole (PROTONIX) 40 MG tablet, Take 1 tablet (40 mg total) by mouth 2 (two) times daily., Disp: 60 tablet, Rfl: 3 .  potassium chloride SA (KLOR-CON) 20 MEQ tablet, Take 1 tablet (20 mEq total) by mouth 2 (two) times daily for 5 days., Disp: 10 tablet, Rfl: 0 .  sucralfate (CARAFATE) 1 g tablet, Take 1 tablet (1 g total) by mouth 4 (four) times daily -  with meals and at bedtime., Disp: 120 tablet, Rfl: 3   Family History  Problem Relation Age of Onset  . Melanoma Paternal Grandmother        of skin  . Hypertension Father   . Anxiety disorder Father   . Depression Father      Social History   Tobacco Use  . Smoking status: Current Every Day Smoker    Packs/day: 0.50    Years: 9.00  Pack years: 4.50    Types: Cigarettes  . Smokeless tobacco: Never Used  Vaping Use  . Vaping Use: Never used  Substance Use Topics  . Alcohol use: No    Alcohol/week: 2.0 standard drinks    Types: 2 Cans of beer per week  . Drug use: No    Allergies as of 07/27/2020  . (No Known Allergies)    Review of Systems:    All systems reviewed and negative except where noted in HPI.   Physical Exam:  BP (!) 169/95 (BP Location: Left Arm, Patient Position: Sitting, Cuff Size: Large)   Pulse (!) 108   Temp 98.1 F (36.7 C) (Oral)   Ht 5\' 4"  (1.626 m)   Wt 212 lb 2 oz (96.2 kg)    BMI 36.41 kg/m  No LMP recorded.  General:   Alert,  Well-developed, well-nourished, pleasant and cooperative in NAD Head:  Normocephalic and atraumatic. Eyes:  Sclera clear, no icterus.   Conjunctiva pink. Ears:  Normal auditory acuity. Nose:  No deformity, discharge, or lesions. Mouth:  No deformity or lesions,oropharynx pink & moist. Neck:  Supple; no masses or thyromegaly. Lungs:  Respirations even and unlabored.  Clear throughout to auscultation.   No wheezes, crackles, or rhonchi. No acute distress. Heart:  Regular rate and rhythm; no murmurs, clicks, rubs, or gallops. Abdomen:  Normal bowel sounds. Soft, obese, non-tender and non-distended without masses, hepatosplenomegaly or hernias noted.  No guarding or rebound tenderness.   Rectal: Not performed Msk:  Symmetrical without gross deformities. Good, equal movement & strength bilaterally. Pulses:  Normal pulses noted. Extremities:  No clubbing or edema.  No cyanosis. Neurologic:  Alert and oriented x3;  grossly normal neurologically. Skin:  Intact without significant lesions or rashes. No jaundice. Lymph Nodes:  No significant cervical adenopathy. Psych:  Alert and cooperative. Normal mood and affect.  Imaging Studies: Reviewed  Assessment and Plan:   [name] is a 34 y.o. pleasant Caucasian female with history of heavy tobacco use, alcohol use, obesity, BMI 36 is seen in consultation for recent episode of acute pancreatitis which has now currently resolved  Acute pancreatitis: With heavy smoking history, concern for underlying chronic pancreatitis. Alcohol use might be also the etiology.  No evidence of cholelithiasis to attribute to gallstone pancreatitis Given her age, recommend to evaluate for autoimmune pancreatitis, check ANA, IgG4 levels as well as CT pancreas protocol Recheck LFTs and BMP Encouraged her to continue to stay on low-fat, low-carb diet  Chronic nonbloody diarrhea with recent episode of acute  pancreatitis Check pancreatic fecal elastase levels.  If these are normal, recommend GI profile PCR to rule out infection   Follow up in 2 months   Jillian Darby, MD

## 2020-07-27 NOTE — Patient Instructions (Addendum)
Your CT scan is scheduled for 08/09/2019 [facility]. Please pick up contrast before the scan. Nothing to eat or drink 4 hours before the scan

## 2020-07-28 ENCOUNTER — Encounter: Payer: Self-pay | Admitting: Gastroenterology

## 2020-07-28 LAB — COMPREHENSIVE METABOLIC PANEL
ALT: 23 IU/L (ref 0–32)
AST: 34 IU/L (ref 0–40)
Albumin/Globulin Ratio: 1.4 (ref 1.2–2.2)
Albumin: 4.1 g/dL (ref 3.8–4.8)
Alkaline Phosphatase: 89 IU/L (ref 44–121)
BUN/Creatinine Ratio: 10 (ref 9–23)
BUN: 7 mg/dL (ref 6–20)
Bilirubin Total: 0.9 mg/dL (ref 0.0–1.2)
CO2: 25 mmol/L (ref 20–29)
Calcium: 9.1 mg/dL (ref 8.7–10.2)
Chloride: 101 mmol/L (ref 96–106)
Creatinine, Ser: 0.67 mg/dL (ref 0.57–1.00)
GFR calc Af Amer: 132 mL/min/{1.73_m2} (ref >59)
GFR calc non Af Amer: 114 mL/min/{1.73_m2} (ref >59)
Globulin, Total: 2.9 g/dL (ref 1.5–4.5)
Glucose: 169 mg/dL — ABNORMAL HIGH (ref 65–99)
Potassium: 3.3 mmol/L — ABNORMAL LOW (ref 3.5–5.2)
Sodium: 140 mmol/L (ref 134–144)
Total Protein: 7 g/dL (ref 6.0–8.5)

## 2020-07-28 LAB — ANA: ANA Titer 1: NEGATIVE

## 2020-07-28 LAB — B12 AND FOLATE PANEL
Folate: 2.3 ng/mL — ABNORMAL LOW (ref >3.0)
Vitamin B-12: 129 pg/mL — ABNORMAL LOW (ref 232–1245)

## 2020-07-28 LAB — IGG 4: IgG, Subclass 4: 49 mg/dL (ref 2–96)

## 2020-08-01 ENCOUNTER — Other Ambulatory Visit: Payer: Self-pay | Admitting: Gastroenterology

## 2020-08-04 LAB — PANCREATIC ELASTASE, FECAL: Pancreatic Elastase, Fecal: >500 ug Elast./g (ref >200)

## 2020-08-08 ENCOUNTER — Ambulatory Visit
Admission: RE | Admit: 2020-08-08 | Discharge: 2020-08-08 | Disposition: A | Discharge status: Home / Self Care | Payer: 59 | Source: Ambulatory Visit | Attending: Gastroenterology | Admitting: Gastroenterology

## 2020-08-08 ENCOUNTER — Ambulatory Visit: Payer: MEDICAID

## 2020-08-08 ENCOUNTER — Other Ambulatory Visit: Payer: Self-pay

## 2020-08-08 DIAGNOSIS — Z8719 Personal history of other diseases of the digestive system: Secondary | ICD-10-CM | POA: Insufficient documentation

## 2020-08-08 DIAGNOSIS — K858 Other acute pancreatitis without necrosis or infection: Secondary | ICD-10-CM | POA: Insufficient documentation

## 2020-08-08 MED ORDER — IOHEXOL 300 MG/ML  SOLN: 75.0000 mL | Freq: Once | INTRAMUSCULAR | Status: DC | PRN

## 2020-08-08 MED ORDER — IOHEXOL 300 MG/ML  SOLN
100.0000 mL | Freq: Once | INTRAMUSCULAR | Status: AC | PRN
  Administered 2020-08-08: 100 mL via INTRAVENOUS

## 2020-08-09 ENCOUNTER — Telehealth: Payer: Self-pay

## 2020-08-09 DIAGNOSIS — R197 Diarrhea, unspecified: Secondary | ICD-10-CM

## 2020-08-09 NOTE — Telephone Encounter (Signed)
Patient verablized understanding results and will come pick up the stool kit

## 2020-08-09 NOTE — Telephone Encounter (Signed)
-----   Message from Toney Reil, MD sent at 08/08/2020  4:42 PM EST ----- Recommend stool studies to rule out infection given that the pancreatic stool test came back normal to evaluate diarrhea  RV

## 2020-08-15 ENCOUNTER — Encounter: Payer: Self-pay | Admitting: Gastroenterology

## 2020-08-15 LAB — GI PROFILE, STOOL, PCR

## 2020-08-16 ENCOUNTER — Other Ambulatory Visit: Payer: Self-pay

## 2020-08-16 DIAGNOSIS — R197 Diarrhea, unspecified: Secondary | ICD-10-CM

## 2020-08-16 MED ORDER — NA SULFATE-K SULFATE-MG SULF 17.5-3.13-1.6 GM/177ML PO SOLN: 354.0000 mL | Freq: Once | ORAL | 0 refills | Status: AC

## 2020-08-16 NOTE — Telephone Encounter (Signed)
Scheduled patient for 08/24/2020 went over instructions with patient and sent instructions to patient mychart. She verbalized understanding and sent prep to pharmacy

## 2020-08-22 ENCOUNTER — Other Ambulatory Visit: Admission: RE | Admit: 2020-08-22 | Payer: MEDICAID | Source: Ambulatory Visit

## 2020-08-23 ENCOUNTER — Other Ambulatory Visit: Payer: Self-pay

## 2020-08-23 ENCOUNTER — Other Ambulatory Visit
Admission: RE | Admit: 2020-08-23 | Discharge: 2020-08-23 | Disposition: A | Discharge status: Home / Self Care | Payer: 59 | Source: Ambulatory Visit | Attending: Gastroenterology | Admitting: Gastroenterology

## 2020-08-23 DIAGNOSIS — U071 COVID-19: Secondary | ICD-10-CM | POA: Diagnosis not present

## 2020-08-23 DIAGNOSIS — Z01818 Encounter for other preprocedural examination: Secondary | ICD-10-CM | POA: Insufficient documentation

## 2020-08-23 LAB — SARS CORONAVIRUS 2 (TAT 6-24 HRS): SARS Coronavirus 2: POSITIVE — AB

## 2020-08-23 NOTE — Progress Notes (Signed)
Informed pt about covid positive result and not to drink prep anymore and procedures will be cancelled  RV

## 2020-08-24 ENCOUNTER — Encounter: Admission: RE | Payer: Self-pay | Source: Home / Self Care

## 2020-08-24 ENCOUNTER — Ambulatory Visit: Admission: RE | Admit: 2020-08-24 | Payer: 59 | Source: Home / Self Care | Admitting: Gastroenterology

## 2020-08-24 ENCOUNTER — Telehealth: Payer: Self-pay

## 2020-08-24 ENCOUNTER — Other Ambulatory Visit: Payer: Self-pay

## 2020-08-24 DIAGNOSIS — R197 Diarrhea, unspecified: Secondary | ICD-10-CM

## 2020-08-24 SURGERY — COLONOSCOPY WITH PROPOFOL
Anesthesia: General

## 2020-08-24 MED ORDER — NA SULFATE-K SULFATE-MG SULF 17.5-3.13-1.6 GM/177ML PO SOLN: 354.0000 mL | Freq: Once | ORAL | 0 refills | Status: AC

## 2020-08-24 NOTE — Telephone Encounter (Signed)
-----   Message from Lin Landsman, MD sent at 08/23/2020 10:40 PM EST ----- Informed pt about covid positive result and not to drink prep anymore and procedures will be cancelled  RV

## 2020-08-24 NOTE — Telephone Encounter (Signed)
Informed patient of this information. She verablized understanding and states Dr. Marius Ditch called her last night. We moved her procedures to 09/21/2020 with dr. Marius Ditch. Informed ENDO by talking to [facility]. Update referral sent new instructions to mychart and prep to the pharmacy

## 2020-08-30 ENCOUNTER — Other Ambulatory Visit: Payer: Self-pay | Admitting: Family Medicine

## 2020-08-30 ENCOUNTER — Encounter: Payer: Self-pay | Admitting: Family Medicine

## 2020-08-30 MED ORDER — HYDROCOD POLST-CPM POLST ER 10-8 MG/5ML PO SUER
5.0000 mL | Freq: Two times a day (BID) | ORAL | 0 refills | Status: DC | PRN
Start: 2020-08-30 — End: 2020-10-06

## 2020-09-19 ENCOUNTER — Encounter: Payer: Self-pay | Admitting: Gastroenterology

## 2020-09-19 ENCOUNTER — Encounter: Payer: Self-pay | Admitting: Family Medicine

## 2020-09-19 NOTE — Telephone Encounter (Signed)
Called and rescheduled patient to 10/06/2020

## 2020-09-26 ENCOUNTER — Encounter: Payer: Self-pay | Admitting: Gastroenterology

## 2020-10-04 ENCOUNTER — Other Ambulatory Visit: Payer: 59

## 2020-10-04 ENCOUNTER — Ambulatory Visit: Payer: 59 | Admitting: Gastroenterology

## 2020-10-04 ENCOUNTER — Encounter: Payer: Self-pay | Admitting: Gastroenterology

## 2020-10-04 NOTE — Telephone Encounter (Signed)
Called patient and informed patient that she needs to go for covid test first thing in the morning

## 2020-10-05 ENCOUNTER — Encounter: Payer: Self-pay | Admitting: Gastroenterology

## 2020-10-06 ENCOUNTER — Other Ambulatory Visit: Payer: Self-pay

## 2020-10-06 ENCOUNTER — Encounter: Payer: Self-pay | Admitting: Gastroenterology

## 2020-10-06 ENCOUNTER — Ambulatory Visit
Admission: RE | Admit: 2020-10-06 | Discharge: 2020-10-06 | Disposition: A | Discharge status: Home / Self Care | Payer: 59 | Attending: Gastroenterology | Admitting: Gastroenterology

## 2020-10-06 ENCOUNTER — Ambulatory Visit: Payer: 59 | Admitting: Anesthesiology

## 2020-10-06 ENCOUNTER — Encounter
Admission: RE | Disposition: A | Discharge status: Home / Self Care | Payer: Self-pay | Source: Home / Self Care | Attending: Gastroenterology

## 2020-10-06 DIAGNOSIS — D125 Benign neoplasm of sigmoid colon: Secondary | ICD-10-CM | POA: Diagnosis not present

## 2020-10-06 DIAGNOSIS — D123 Benign neoplasm of transverse colon: Secondary | ICD-10-CM | POA: Insufficient documentation

## 2020-10-06 DIAGNOSIS — K635 Polyp of colon: Secondary | ICD-10-CM | POA: Diagnosis not present

## 2020-10-06 DIAGNOSIS — K319 Disease of stomach and duodenum, unspecified: Secondary | ICD-10-CM | POA: Insufficient documentation

## 2020-10-06 DIAGNOSIS — R197 Diarrhea, unspecified: Secondary | ICD-10-CM

## 2020-10-06 DIAGNOSIS — Z79899 Other long term (current) drug therapy: Secondary | ICD-10-CM | POA: Insufficient documentation

## 2020-10-06 DIAGNOSIS — Z85828 Personal history of other malignant neoplasm of skin: Secondary | ICD-10-CM | POA: Insufficient documentation

## 2020-10-06 DIAGNOSIS — K529 Noninfective gastroenteritis and colitis, unspecified: Secondary | ICD-10-CM | POA: Insufficient documentation

## 2020-10-06 DIAGNOSIS — K6389 Other specified diseases of intestine: Secondary | ICD-10-CM | POA: Insufficient documentation

## 2020-10-06 DIAGNOSIS — K449 Diaphragmatic hernia without obstruction or gangrene: Secondary | ICD-10-CM | POA: Diagnosis not present

## 2020-10-06 DIAGNOSIS — F17210 Nicotine dependence, cigarettes, uncomplicated: Secondary | ICD-10-CM | POA: Diagnosis not present

## 2020-10-06 HISTORY — DX: Unspecified abdominal pain: R10.9

## 2020-10-06 HISTORY — PX: COLONOSCOPY WITH PROPOFOL: SHX5780

## 2020-10-06 HISTORY — PX: ESOPHAGOGASTRODUODENOSCOPY (EGD) WITH PROPOFOL: SHX5813

## 2020-10-06 LAB — POCT PREGNANCY, URINE: Preg Test, Ur: NEGATIVE

## 2020-10-06 SURGERY — COLONOSCOPY WITH PROPOFOL
Anesthesia: General

## 2020-10-06 MED ORDER — PROPOFOL 500 MG/50ML IV EMUL
INTRAVENOUS | Status: AC
  Filled 2020-10-06: qty 50

## 2020-10-06 MED ORDER — BUTAMBEN-TETRACAINE-BENZOCAINE 2-2-14 % EX AERO
INHALATION_SPRAY | CUTANEOUS | Status: AC
  Filled 2020-10-06: qty 5

## 2020-10-06 MED ORDER — PROPOFOL 500 MG/50ML IV EMUL
INTRAVENOUS | Status: DC | PRN
  Administered 2020-10-06: 150 ug/kg/min via INTRAVENOUS

## 2020-10-06 MED ORDER — SODIUM CHLORIDE 0.9 % IV SOLN: INTRAVENOUS | Status: DC

## 2020-10-06 NOTE — H&P (Signed)
Jillian Darby, MD [address]  [place]  [place], [place] [address]  Main: [phone]  Fax: [phone] Pager: [phone]  Primary Care Physician:  Jillian Frizzle, MD Primary Gastroenterologist:  Dr. Cephas Shepherd  Pre-Procedure History & Physical: HPI:  Jillian Shepherd is a 35 y.o. female is here for an endoscopy and colonoscopy.   Past Medical History:  Diagnosis Date  . Abdominal pain   . Cancer ([place])    squmaous cell skin cancer   . Collagen vascular disease ([place])   . Obesity (BMI 35.0-39.9 without comorbidity)     Past Surgical History:  Procedure Laterality Date  . ADENOIDECTOMY    . TONSILLECTOMY      Prior to Admission medications   Medication Sig Start Date End Date Taking? Authorizing Provider  pantoprazole (PROTONIX) 40 MG tablet Take 1 tablet (40 mg total) by mouth 2 (two) times daily. 04/08/20  Yes Jillian Frizzle, MD  chlorpheniramine-HYDROcodone (TUSSIONEX PENNKINETIC ER) 10-8 MG/5ML SUER Take 5 mLs by mouth every 12 (twelve) hours as needed for cough. Patient not taking: Reported on 10/06/2020 08/30/20   Jillian Frizzle, MD  potassium chloride SA (KLOR-CON) 20 MEQ tablet Take 1 tablet (20 mEq total) by mouth 2 (two) times daily for 5 days. 05/10/20 05/15/20  Jillian Frizzle, MD  sucralfate (CARAFATE) 1 g tablet Take 1 tablet (1 g total) by mouth 4 (four) times daily -  with meals and at bedtime. Patient not taking: Reported on 10/06/2020 05/09/20   Jillian Frizzle, MD    Allergies as of 08/24/2020  . (No Known Allergies)    Family History  Problem Relation Age of Onset  . Melanoma Paternal Grandmother        of skin  . Hypertension Father   . Anxiety disorder Father   . Depression Father     Social History   Socioeconomic History  . Marital status: Married    Spouse name: Not on file  . Number of children: Not on file  . Years of education: Not on file  . Highest education level: Not on file  Occupational History  . Not on  file  Tobacco Use  . Smoking status: Current Every Day Smoker    Packs/day: 0.25    Years: 9.00    Pack years: 2.25    Types: Cigarettes  . Smokeless tobacco: Never Used  Vaping Use  . Vaping Use: Never used  Substance and Sexual Activity  . Alcohol use: No    Alcohol/week: 2.0 standard drinks    Types: 2 Cans of beer per week  . Drug use: No  . Sexual activity: Yes    Birth control/protection: None  Other Topics Concern  . Not on file  Social History Narrative   ** Merged History Encounter **       Social Determinants of Health   Financial Resource Strain: Not on file  Food Insecurity: Not on file  Transportation Needs: Not on file  Physical Activity: Not on file  Stress: Not on file  Social Connections: Not on file  Intimate Partner Violence: Not on file    Review of Systems: See HPI, otherwise negative ROS  Physical Exam: BP (!) 171/107   Pulse 90   Temp (!) 97.3 F (36.3 C) (Temporal)   Resp 18   Ht 5' 3.5" (1.613 m)   Wt 93.9 kg   LMP 09/29/2020   SpO2 100%   BMI 36.09 kg/m  General:  Alert,  pleasant and cooperative in NAD Head:  Normocephalic and atraumatic. Neck:  Supple; no masses or thyromegaly. Lungs:  Clear throughout to auscultation.    Heart:  Regular rate and rhythm. Abdomen:  Soft, nontender and nondistended. Normal bowel sounds, without guarding, and without rebound.   Neurologic:  Alert and  oriented x4;  grossly normal neurologically.  Impression/Plan: Jillian Shepherd is here for an endoscopy and colonoscopy to be performed for chronic diarrhea  Risks, benefits, limitations, and alternatives regarding  endoscopy and colonoscopy have been reviewed with the patient.  Questions have been answered.  All parties agreeable.   Sherri Sear, MD  10/06/2020, 8:41 AM

## 2020-10-06 NOTE — Anesthesia Procedure Notes (Signed)
Performed by: Vaughan Sine Pre-anesthesia Checklist: Emergency Drugs available, Patient identified, Suction available, Patient being monitored and Timeout performed Patient Re-evaluated:Patient Re-evaluated prior to induction Oxygen Delivery Method: Simple face mask Preoxygenation: Pre-oxygenation with 100% oxygen Induction Type: IV induction Airway Equipment and Method: Bite block Placement Confirmation: positive ETCO2 and CO2 detector

## 2020-10-06 NOTE — Transfer of Care (Signed)
Immediate Anesthesia Transfer of Care Note  Patient: Jillian Shepherd  Procedure(s) Performed: COLONOSCOPY WITH PROPOFOL (N/A ) ESOPHAGOGASTRODUODENOSCOPY (EGD) WITH PROPOFOL (N/A )  Patient Location: PACU  Anesthesia Type:General  Level of Consciousness: awake and sedated  Airway & Oxygen Therapy: Patient Spontanous Breathing and Patient connected to face mask oxygen  Post-op Assessment: Report given to RN and Post -op Vital signs reviewed and stable  Post vital signs: Reviewed and stable  Last Vitals:  Vitals Value Taken Time  BP    Temp    Pulse    Resp    SpO2      Last Pain:  Vitals:   10/06/20 0825  TempSrc: Temporal  PainSc: 0-No pain         Complications: No complications documented.

## 2020-10-06 NOTE — Op Note (Signed)
[facility] Gastroenterology Patient Name: Jillian Shepherd Procedure Date: 10/06/2020 8:47 AM MRN: [id number] Account #: [id number] Date of Birth: 10-12-1985 Admit Type: Outpatient Age: 35 Room: [facility] ENDO ROOM 4 Gender: Female Note Status: Finalized Procedure:             Colonoscopy Indications:           Chronic diarrhea, Clinically significant diarrhea of                         unexplained origin Providers:             Lin Landsman MD, MD Referring MD:          Cammie Mcgee. Pickard (Referring MD) Medicines:             General Anesthesia Complications:         No immediate complications. Estimated blood loss: None. Procedure:             Pre-Anesthesia Assessment:                        - Prior to the procedure, a History and Physical was                         performed, and patient medications and allergies were                         reviewed. The patient is competent. The risks and                         benefits of the procedure and the sedation options and                         risks were discussed with the patient. All questions                         were answered and informed consent was obtained.                         Patient identification and proposed procedure were                         verified by the physician, the nurse, the                         anesthesiologist, the anesthetist and the technician                         in the pre-procedure area in the procedure room in the                         endoscopy suite. Mental Status Examination: alert and                         oriented. Airway Examination: normal oropharyngeal                         airway and neck mobility. Respiratory Examination:  clear to auscultation. CV Examination: normal.                         Prophylactic Antibiotics: The patient does not require                         prophylactic antibiotics. Prior Anticoagulants: The                          patient has taken no previous anticoagulant or                         antiplatelet agents. ASA Grade Assessment: II - A                         patient with mild systemic disease. After reviewing                         the risks and benefits, the patient was deemed in                         satisfactory condition to undergo the procedure. The                         anesthesia plan was to use general anesthesia.                         Immediately prior to administration of medications,                         the patient was re-assessed for adequacy to receive                         sedatives. The heart rate, respiratory rate, oxygen                         saturations, blood pressure, adequacy of pulmonary                         ventilation, and response to care were monitored                         throughout the procedure. The physical status of the                         patient was re-assessed after the procedure.                        After obtaining informed consent, the colonoscope was                         passed under direct vision. Throughout the procedure,                         the patient's blood pressure, pulse, and oxygen                         saturations were monitored continuously. The  Colonoscope was introduced through the anus and                         advanced to the the terminal ileum, with                         identification of the appendiceal orifice and IC                         valve. The colonoscopy was performed without                         difficulty. The patient tolerated the procedure well.                         The quality of the bowel preparation was inadequate. Findings:      The perianal and digital rectal examinations were normal. Pertinent       negatives include normal sphincter tone and no palpable rectal lesions.      The terminal ileum appeared normal.      A 6 mm polyp was found in the  transverse colon. The polyp was sessile.       The polyp was removed with a cold snare. Resection and retrieval were       complete.      A patchy area of mildly erythematous mucosa was found in the sigmoid       colon. Biopsies were taken with a cold forceps for histology.      The retroflexed view of the distal rectum and anal verge was normal and       showed no anal or rectal abnormalities. Impression:            - Preparation of the colon was inadequate.                        - The examined portion of the ileum was normal.                        - One 6 mm polyp in the transverse colon, removed with                         a cold snare. Resected and retrieved.                        - Erythematous mucosa in the sigmoid colon. Biopsied.                        - The distal rectum and anal verge are normal on                         retroflexion view. Recommendation:        - Discharge patient to home (with escort).                        - Resume previous diet today.                        - Continue present medications.                        -  Await pathology results.                        - Return to my office as previously scheduled.                        - Repeat colonoscopy in 6 months with 2 day prep given                         h/o polyp, because the bowel preparation was poor. Procedure Code(s):     --- Professional ---                        [phone], Colonoscopy, flexible; with removal of                         tumor(s), polyp(s), or other lesion(s) by snare                         technique                        45380, 69, Colonoscopy, flexible; with biopsy, single                         or multiple Diagnosis Code(s):     --- Professional ---                        K63.5, Polyp of colon                        K63.89, Other specified diseases of intestine                        K52.9, Noninfective gastroenteritis and colitis,                         unspecified                         R19.7, Diarrhea, unspecified CPT copyright 2019 American Medical Association. All rights reserved. The codes documented in this report are preliminary and upon coder review may  be revised to meet current compliance requirements. Dr. Ulyess Mort Lin Landsman MD, MD 10/06/2020 9:27:25 AM This report has been signed electronically. Number of Addenda: 0 Note Initiated On: 10/06/2020 8:47 AM Scope Withdrawal Time: 0 hours 14 minutes 19 seconds  Total Procedure Duration: 0 hours 18 minutes 7 seconds  Estimated Blood Loss:  Estimated blood loss: none.      [facility]

## 2020-10-06 NOTE — Anesthesia Postprocedure Evaluation (Signed)
Anesthesia Post Note  Patient: [name]  Procedure(s) Performed: COLONOSCOPY WITH PROPOFOL (N/A ) ESOPHAGOGASTRODUODENOSCOPY (EGD) WITH PROPOFOL (N/A )  Patient location during evaluation: PACU Anesthesia Type: General Level of consciousness: awake and alert Pain management: pain level controlled Vital Signs Assessment: post-procedure vital signs reviewed and stable Respiratory status: spontaneous breathing and respiratory function stable Cardiovascular status: stable Anesthetic complications: no   No complications documented.   Last Vitals:  Vitals:   10/06/20 0825 10/06/20 0928  BP: (!) 171/107   Pulse: 90   Resp: 18   Temp: (!) 36.3 C (!) 36.2 C  SpO2: 100%     Last Pain:  Vitals:   10/06/20 0958  TempSrc:   PainSc: 0-No pain                 Tajae Rybicki K

## 2020-10-06 NOTE — Op Note (Signed)
[facility] Gastroenterology Patient Name: Jillian Shepherd Procedure Date: 10/06/2020 8:47 AM MRN: [id number] Account #: [id number] Date of Birth: 1985/10/30 Admit Type: Outpatient Age: 35 Room: [facility] ENDO ROOM 4 Gender: Female Note Status: Finalized Procedure:             Upper GI endoscopy Indications:           Diarrhea Providers:             Lin Landsman MD, MD Referring MD:          Cammie Mcgee. Pickard (Referring MD) Medicines:             General Anesthesia Complications:         No immediate complications. Estimated blood loss: None. Procedure:             Pre-Anesthesia Assessment:                        - Prior to the procedure, a History and Physical was                         performed, and patient medications and allergies were                         reviewed. The patient is competent. The risks and                         benefits of the procedure and the sedation options and                         risks were discussed with the patient. All questions                         were answered and informed consent was obtained.                         Patient identification and proposed procedure were                         verified by the physician, the nurse, the                         anesthesiologist, the anesthetist and the technician                         in the pre-procedure area in the procedure room in the                         endoscopy suite. Mental Status Examination: alert and                         oriented. Airway Examination: normal oropharyngeal                         airway and neck mobility. Respiratory Examination:                         clear to auscultation. CV Examination: normal.  Prophylactic Antibiotics: The patient does not require                         prophylactic antibiotics. Prior Anticoagulants: The                         patient has taken no previous anticoagulant or                          antiplatelet agents. ASA Grade Assessment: II - A                         patient with mild systemic disease. After reviewing                         the risks and benefits, the patient was deemed in                         satisfactory condition to undergo the procedure. The                         anesthesia plan was to use general anesthesia.                         Immediately prior to administration of medications,                         the patient was re-assessed for adequacy to receive                         sedatives. The heart rate, respiratory rate, oxygen                         saturations, blood pressure, adequacy of pulmonary                         ventilation, and response to care were monitored                         throughout the procedure. The physical status of the                         patient was re-assessed after the procedure.                        After obtaining informed consent, the endoscope was                         passed under direct vision. Throughout the procedure,                         the patient's blood pressure, pulse, and oxygen                         saturations were monitored continuously. The Endoscope                         was introduced through the mouth, and advanced to the  second part of duodenum. The upper GI endoscopy was                         accomplished without difficulty. The patient tolerated                         the procedure fairly well. Findings:      The ampulla, duodenal bulb and second portion of the duodenum were       normal. Biopsies for histology were taken with a cold forceps for       evaluation of celiac disease.      Diffuse moderately erythematous mucosa without bleeding was found in the       gastric antrum. Biopsies were taken with a cold forceps for Helicobacter       pylori testing.      The gastric body and incisura were normal. Biopsies were taken with a       cold  forceps for Helicobacter pylori testing.      The cardia and gastric fundus were normal on retroflexion.      A small hiatal hernia was present.      Esophagogastric landmarks were identified: the gastroesophageal junction       was found at 36 cm from the incisors.      The gastroesophageal junction and examined esophagus were normal. Impression:            - Normal ampulla, duodenal bulb and second portion of                         the duodenum. Biopsied.                        - Erythematous mucosa in the antrum. Biopsied.                        - Normal gastric body and incisura. Biopsied.                        - Small hiatal hernia.                        - Esophagogastric landmarks identified.                        - Normal gastroesophageal junction and esophagus. Recommendation:        - Await pathology results.                        - Follow an antireflux regimen.                        - Continue present medications.                        - Proceed with colonoscopy as scheduled                        See colonoscopy report Procedure Code(s):     --- Professional ---                        [phone], Esophagogastroduodenoscopy, flexible,  transoral; with biopsy, single or multiple Diagnosis Code(s):     --- Professional ---                        K44.9, Diaphragmatic hernia without obstruction or                         gangrene                        K31.89, Other diseases of stomach and duodenum                        R19.7, Diarrhea, unspecified CPT copyright 2019 American Medical Association. All rights reserved. The codes documented in this report are preliminary and upon coder review may  be revised to meet current compliance requirements. Dr. Ulyess Mort Lin Landsman MD, MD 10/06/2020 9:04:16 AM This report has been signed electronically. Number of Addenda: 0 Note Initiated On: 10/06/2020 8:47 AM Estimated Blood Loss:  Estimated blood loss:  none.      [facility]

## 2020-10-06 NOTE — Anesthesia Preprocedure Evaluation (Signed)
Anesthesia Evaluation  Patient identified by MRN, date of birth, ID band Patient awake    Reviewed: Allergy & Precautions, NPO status , Patient's Chart, lab work & pertinent test results  History of Anesthesia Complications Negative for: history of anesthetic complications  Airway Mallampati: II       Dental   Pulmonary neg sleep apnea, neg COPD, Current Smoker,           Cardiovascular hypertension, (-) Past MI and (-) CHF (-) dysrhythmias (-) Valvular Problems/Murmurs     Neuro/Psych neg Seizures Depression    GI/Hepatic Neg liver ROS, neg GERD  ,  Endo/Other  neg diabetes  Renal/GU negative Renal ROS     Musculoskeletal   Abdominal   Peds  Hematology   Anesthesia Other Findings   Reproductive/Obstetrics                             Anesthesia Physical Anesthesia Plan  ASA: II  Anesthesia Plan: General   Post-op Pain Management:    Induction: Intravenous  PONV Risk Score and Plan: Propofol infusion and TIVA  Airway Management Planned: Nasal Cannula  Additional Equipment:   Intra-op Plan:   Post-operative Plan:   Informed Consent: I have reviewed the patients History and Physical, chart, labs and discussed the procedure including the risks, benefits and alternatives for the proposed anesthesia with the patient or authorized representative who has indicated his/her understanding and acceptance.       Plan Discussed with:   Anesthesia Plan Comments:         Anesthesia Quick Evaluation

## 2020-10-07 ENCOUNTER — Encounter: Payer: Self-pay | Admitting: Gastroenterology

## 2020-10-07 LAB — SURGICAL PATHOLOGY

## 2020-10-11 ENCOUNTER — Ambulatory Visit (INDEPENDENT_AMBULATORY_CARE_PROVIDER_SITE_OTHER): Payer: 59 | Admitting: Family Medicine

## 2020-10-11 ENCOUNTER — Encounter: Payer: Self-pay | Admitting: Family Medicine

## 2020-10-11 ENCOUNTER — Other Ambulatory Visit: Payer: Self-pay

## 2020-10-11 VITALS — BP 179/59 | HR 82 | Temp 97.6°F | Wt 205.0 lb

## 2020-10-11 DIAGNOSIS — I158 Other secondary hypertension: Secondary | ICD-10-CM

## 2020-10-11 MED ORDER — AMLODIPINE BESYLATE 10 MG PO TABS: 10.0000 mg | ORAL_TABLET | Freq: Every day | ORAL | 3 refills | Status: DC

## 2020-10-11 NOTE — Progress Notes (Signed)
Subjective:    Patient ID: Jillian Shepherd, female    DOB: 08/07/1985, 35 y.o.   MRN: [id number]  HPI  Patient is a 35 year old Caucasian female who presents today with elevated blood pressure.  I last saw her in October and at that time her blood pressure was elevated at 700 systolic.  However at that time she was having severe abdominal pain.  She is undergone a thorough diagnostic work-up for this including a CT scan which revealed no adenoma on the adrenal glands however her blood pressure has remained elevated at every office visit.  She is getting systolic blood pressures as high as 180.  Also of note, her potassium levels are extremely low on a consistent basis.  This raises concern about secondary hypertension from primary hyperaldosteronism.  She denies any chest pain shortness of breath or dyspnea on exertion Past Medical History:  Diagnosis Date  . Abdominal pain   . Cancer ([place])    squmaous cell skin cancer   . Collagen vascular disease ([place])   . Obesity (BMI 35.0-39.9 without comorbidity)    Past Surgical History:  Procedure Laterality Date  . ADENOIDECTOMY    . COLONOSCOPY WITH PROPOFOL N/A 10/06/2020   Procedure: COLONOSCOPY WITH PROPOFOL;  Surgeon: Lin Landsman, MD;  Location: [facility] ENDOSCOPY;  Service: Gastroenterology;  Laterality: N/A;  COVID POSITIVE 08/23/2020  . ESOPHAGOGASTRODUODENOSCOPY (EGD) WITH PROPOFOL N/A 10/06/2020   Procedure: ESOPHAGOGASTRODUODENOSCOPY (EGD) WITH PROPOFOL;  Surgeon: Lin Landsman, MD;  Location: [place];  Service: Gastroenterology;  Laterality: N/A;  . TONSILLECTOMY     Current Outpatient Medications on File Prior to Visit  Medication Sig Dispense Refill  . pantoprazole (PROTONIX) 40 MG tablet Take 1 tablet (40 mg total) by mouth 2 (two) times daily. 60 tablet 3  . potassium chloride SA (KLOR-CON) 20 MEQ tablet Take 1 tablet (20 mEq total) by mouth 2 (two) times daily for 5 days. 10 tablet 0   No current  facility-administered medications on file prior to visit.   No Known Allergies Social History   Socioeconomic History  . Marital status: Married    Spouse name: Not on file  . Number of children: Not on file  . Years of education: Not on file  . Highest education level: Not on file  Occupational History  . Not on file  Tobacco Use  . Smoking status: Current Every Day Smoker    Packs/day: 0.25    Years: 9.00    Pack years: 2.25    Types: Cigarettes  . Smokeless tobacco: Never Used  Vaping Use  . Vaping Use: Never used  Substance and Sexual Activity  . Alcohol use: No    Alcohol/week: 2.0 standard drinks    Types: 2 Cans of beer per week  . Drug use: No  . Sexual activity: Yes    Birth control/protection: None  Other Topics Concern  . Not on file  Social History Narrative   ** Merged History Encounter **       Social Determinants of Health   Financial Resource Strain: Not on file  Food Insecurity: Not on file  Transportation Needs: Not on file  Physical Activity: Not on file  Stress: Not on file  Social Connections: Not on file  Intimate Partner Violence: Not on file     Review of Systems  All other systems reviewed and are negative.      Objective:   Physical Exam Vitals reviewed.  Constitutional:      General:  She is not in acute distress.    Appearance: Normal appearance. She is obese. She is not ill-appearing, toxic-appearing or diaphoretic.  HENT:     Nose: Nose normal. No congestion or rhinorrhea.     Mouth/Throat:     Pharynx: Oropharynx is clear. No oropharyngeal exudate or posterior oropharyngeal erythema.  Eyes:     General: No scleral icterus.    Conjunctiva/sclera: Conjunctivae normal.  Cardiovascular:     Rate and Rhythm: Normal rate and regular rhythm.     Pulses: Normal pulses.     Heart sounds: Normal heart sounds. No murmur heard. No friction rub. No gallop.   Pulmonary:     Effort: Pulmonary effort is normal. No respiratory  distress.     Breath sounds: Normal breath sounds. No stridor. No wheezing, rhonchi or rales.  Chest:     Chest wall: No tenderness.  Abdominal:     General: Abdomen is flat. Bowel sounds are normal. There is no distension.     Palpations: Abdomen is soft. There is no mass.     Tenderness: There is no right CVA tenderness, left CVA tenderness, guarding or rebound.     Hernia: No hernia is present.  Musculoskeletal:     Cervical back: Neck supple.  Neurological:     Mental Status: She is alert.           Assessment & Plan:  Other secondary hypertension - Plan: Aldosterone + renin activity w/ ratio, BASIC METABOLIC PANEL WITH GFR, Aldosterone + renin activity w/ ratio  Begin amlodipine 10 mg a day given the elevation in her blood pressure.  However I want her to return in the morning to check a aldosterone/renin activity with ratio to evaluate for possible primary hyperaldosteronism.  If consistent, she would benefit from spironolactone versus an endocrinology consultation.  I will also check a BMP today to see if her hypokalemia is persistent however I suspect that she has Conn's syndrome.

## 2020-10-12 LAB — BASIC METABOLIC PANEL WITH GFR
BUN: 11 mg/dL (ref 7–25)
CO2: 30 mmol/L (ref 20–32)
Calcium: 9.3 mg/dL (ref 8.6–10.2)
Chloride: 102 mmol/L (ref 98–110)
Creat: 0.7 mg/dL (ref 0.50–1.10)
GFR, Est African American: 129 mL/min/{1.73_m2} (ref >=60)
GFR, Est Non African American: 111 mL/min/{1.73_m2} (ref >=60)
Glucose, Bld: 96 mg/dL (ref 65–99)
Potassium: 3.2 mmol/L — ABNORMAL LOW (ref 3.5–5.3)
Sodium: 140 mmol/L (ref 135–146)

## 2020-10-13 ENCOUNTER — Other Ambulatory Visit: Payer: 59

## 2020-10-13 ENCOUNTER — Other Ambulatory Visit: Payer: Self-pay

## 2020-10-13 DIAGNOSIS — I158 Other secondary hypertension: Secondary | ICD-10-CM

## 2020-10-19 LAB — ALDOSTERONE + RENIN ACTIVITY W/ RATIO
ALDO / PRA Ratio: 0.8 Ratio — ABNORMAL LOW (ref 0.9–28.9)
Aldosterone: 1 ng/dL
Renin Activity: 1.3 ng/mL/h (ref 0.25–5.82)

## 2020-10-20 ENCOUNTER — Encounter: Payer: Self-pay | Admitting: Family Medicine

## 2020-10-24 ENCOUNTER — Telehealth: Payer: Self-pay | Admitting: *Deleted

## 2020-10-24 ENCOUNTER — Other Ambulatory Visit: Payer: Self-pay | Admitting: *Deleted

## 2020-10-24 DIAGNOSIS — E876 Hypokalemia: Secondary | ICD-10-CM

## 2020-10-24 NOTE — Telephone Encounter (Signed)
Received call from patient.    Reports that she has intermittent chest pain with her menses. Reports that pain is centered in her chest through sternum and into back. States that pain is from top of sternum and down to bottom of ribs.   Patient states that she has recently;y had imaging that showed her pancreas and gallbladder are not inflamed (CT 08/08/2020).  States that she has concern for Tietze syndrome as her aunt has diagnosis of such and Sx are similar. Advised that patient will need OV for evaluation as Tietze syndrome is a diagnosis of exclusion.   Appointment scheduled.

## 2020-10-26 ENCOUNTER — Other Ambulatory Visit: Payer: Self-pay

## 2020-10-26 ENCOUNTER — Encounter: Payer: Self-pay | Admitting: Family Medicine

## 2020-10-26 ENCOUNTER — Ambulatory Visit (INDEPENDENT_AMBULATORY_CARE_PROVIDER_SITE_OTHER): Payer: 59 | Admitting: Family Medicine

## 2020-10-26 VITALS — BP 158/68 | HR 84 | Temp 98.1°F | Resp 14 | Ht 64.0 in | Wt 201.0 lb

## 2020-10-26 DIAGNOSIS — E876 Hypokalemia: Secondary | ICD-10-CM | POA: Diagnosis not present

## 2020-10-26 DIAGNOSIS — L700 Acne vulgaris: Secondary | ICD-10-CM

## 2020-10-26 DIAGNOSIS — I158 Other secondary hypertension: Secondary | ICD-10-CM

## 2020-10-26 DIAGNOSIS — L680 Hirsutism: Secondary | ICD-10-CM

## 2020-10-26 DIAGNOSIS — E669 Obesity, unspecified: Secondary | ICD-10-CM

## 2020-10-26 NOTE — Progress Notes (Signed)
Subjective:    Patient ID: Jillian Shepherd, female    DOB: 12-May-1986, 35 y.o.   MRN: [id number]  HPI 10/11/20 Patient is a 35 year old Caucasian female who presents today with elevated blood pressure.  I last saw her in October and at that time her blood pressure was elevated at 443 systolic.  However at that time she was having severe abdominal pain.  She is undergone a thorough diagnostic work-up for this including a CT scan which revealed no adenoma on the adrenal glands however her blood pressure has remained elevated at every office visit.  She is getting systolic blood pressures as high as 180.  Also of note, her potassium levels are extremely low on a consistent basis.  This raises concern about secondary hypertension from primary hyperaldosteronism.  She denies any chest pain shortness of breath or dyspnea on exertion.  At that time, my plan was: Begin amlodipine 10 mg a day given the elevation in her blood pressure.  However I want her to return in the morning to check a aldosterone/renin activity with ratio to evaluate for possible primary hyperaldosteronism.  If consistent, she would benefit from spironolactone versus an endocrinology consultation.  I will also check a BMP today to see if her hypokalemia is persistent however I suspect that she has Conn's syndrome.  10/26/20 Lab work does not suggest primary hyperaldosteronism.  I had recommended the patient return for a 24-hour urine cortisol to evaluate for Cushing's syndrome given the persistent hypokalemia.  Patient presents today complaining of abdominal pain.  She also occasionally has chest pain.  The pain is sharp.  It radiates from her abdomen into her back.  It does not sound cardiac in nature.  She previously had pancreatitis last year.  However she is also been having increased acne on her face and on her back.  She is also noticed increasing erythema on her face as well as hirsutism.  She does have striae on her abdomen.  She  continues to have resistant hypertension and profound hypokalemia.  I am concerned that these findings could potentially be associated with Cushing's syndrome.  Past Medical History:  Diagnosis Date  . Abdominal pain   . Cancer ([place])    squmaous cell skin cancer   . Collagen vascular disease ([place])   . Obesity (BMI 35.0-39.9 without comorbidity)    Past Surgical History:  Procedure Laterality Date  . ADENOIDECTOMY    . COLONOSCOPY WITH PROPOFOL N/A 10/06/2020   Procedure: COLONOSCOPY WITH PROPOFOL;  Surgeon: Lin Landsman, MD;  Location: [facility] ENDOSCOPY;  Service: Gastroenterology;  Laterality: N/A;  COVID POSITIVE 08/23/2020  . ESOPHAGOGASTRODUODENOSCOPY (EGD) WITH PROPOFOL N/A 10/06/2020   Procedure: ESOPHAGOGASTRODUODENOSCOPY (EGD) WITH PROPOFOL;  Surgeon: Lin Landsman, MD;  Location: [place];  Service: Gastroenterology;  Laterality: N/A;  . TONSILLECTOMY     Current Outpatient Medications on File Prior to Visit  Medication Sig Dispense Refill  . amLODipine (NORVASC) 10 MG tablet Take 1 tablet (10 mg total) by mouth daily. 90 tablet 3  . pantoprazole (PROTONIX) 40 MG tablet Take 1 tablet (40 mg total) by mouth 2 (two) times daily. 60 tablet 3  . potassium chloride SA (KLOR-CON) 20 MEQ tablet Take 1 tablet (20 mEq total) by mouth 2 (two) times daily for 5 days. 10 tablet 0   No current facility-administered medications on file prior to visit.   No Known Allergies Social History   Socioeconomic History  . Marital status: Married    Spouse name:  Not on file  . Number of children: Not on file  . Years of education: Not on file  . Highest education level: Not on file  Occupational History  . Not on file  Tobacco Use  . Smoking status: Current Every Day Smoker    Packs/day: 0.25    Years: 9.00    Pack years: 2.25    Types: Cigarettes  . Smokeless tobacco: Never Used  Vaping Use  . Vaping Use: Never used  Substance and Sexual Activity  . Alcohol use: No     Alcohol/week: 2.0 standard drinks    Types: 2 Cans of beer per week  . Drug use: No  . Sexual activity: Yes    Birth control/protection: None  Other Topics Concern  . Not on file  Social History Narrative   ** Merged History Encounter **       Social Determinants of Health   Financial Resource Strain: Not on file  Food Insecurity: Not on file  Transportation Needs: Not on file  Physical Activity: Not on file  Stress: Not on file  Social Connections: Not on file  Intimate Partner Violence: Not on file     Review of Systems  All other systems reviewed and are negative.      Objective:   Physical Exam Vitals reviewed.  Constitutional:      General: She is not in acute distress.    Appearance: Normal appearance. She is obese. She is not ill-appearing, toxic-appearing or diaphoretic.  HENT:     Nose: Nose normal. No congestion or rhinorrhea.     Mouth/Throat:     Pharynx: Oropharynx is clear. No oropharyngeal exudate or posterior oropharyngeal erythema.  Eyes:     General: No scleral icterus.    Conjunctiva/sclera: Conjunctivae normal.  Cardiovascular:     Rate and Rhythm: Normal rate and regular rhythm.     Pulses: Normal pulses.     Heart sounds: Normal heart sounds. No murmur heard. No friction rub. No gallop.   Pulmonary:     Effort: Pulmonary effort is normal. No respiratory distress.     Breath sounds: Normal breath sounds. No stridor. No wheezing, rhonchi or rales.  Chest:     Chest wall: No tenderness.  Abdominal:     General: Abdomen is flat. Bowel sounds are normal. There is no distension.     Palpations: Abdomen is soft. There is no mass.     Tenderness: There is no right CVA tenderness, left CVA tenderness, guarding or rebound.     Hernia: No hernia is present.  Musculoskeletal:     Cervical back: Neck supple.  Neurological:     Mental Status: She is alert.           Assessment & Plan:  Hypokalemia - Plan: Cortisol, Free and Cortisone, 24  hour urine with Creatinine  Other secondary hypertension  Obesity with serious comorbidity, unspecified classification, unspecified obesity type  Acne vulgaris  Hirsutism   Obtain 24-hour urine cortisol level to evaluate for Cushing syndrome.  If elevated, I would recommend endocrinology consultation.  She certainly has exam findings concerning for Cushing's including increased acne, hirsutism, resistant hypertension, hypokalemia.  She also has obesity and abdominal striae.  She does not have a buffalo hump.  Labs also have not shown any evidence of hyperaldosteronism.

## 2020-10-28 ENCOUNTER — Other Ambulatory Visit: Payer: 59

## 2020-10-28 ENCOUNTER — Other Ambulatory Visit: Payer: Self-pay

## 2020-10-28 DIAGNOSIS — E876 Hypokalemia: Secondary | ICD-10-CM

## 2020-11-02 LAB — CORTISOL, FREE AND CORTISONE, 24 HOUR URINE W/CREATININE
24 Hour urine volume (VMAHVA): 1825 mL
CREATININE, URINE: 0.27 g/(24.h) — ABNORMAL LOW (ref 0.50–2.15)
Cortisol (Ur), Free: 5.1 mcg/24 h (ref 4.0–50.0)
Cortisone, 24H Ur: 31.9 mcg/24 h (ref 23–195)

## 2021-01-26 IMAGING — US US ABDOMEN LIMITED
1 series · 14 of 25 positions shown · non-contrast
Comparison: None.

CLINICAL DATA: Pancreatitis and epigastric pain for 2 weeks

EXAM:
ULTRASOUND ABDOMEN LIMITED RIGHT UPPER QUADRANT

[Series 1: us abdomen limited ruq · 14 of 40 slices shown]
[im 1/40]
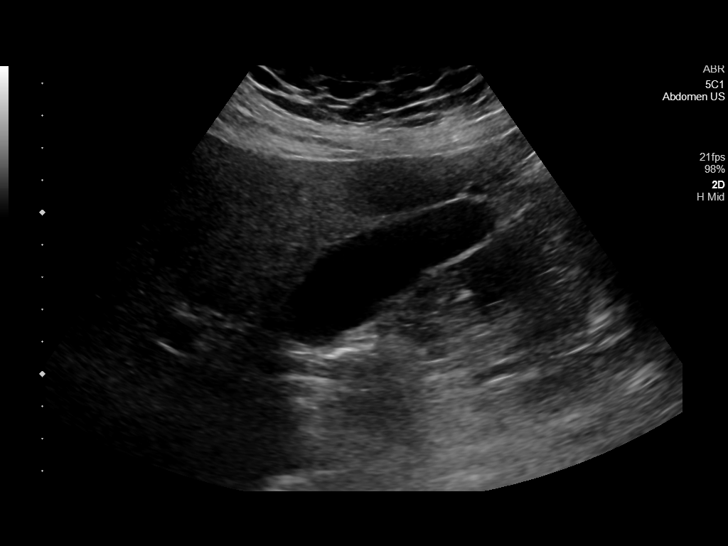
[im 4/40]
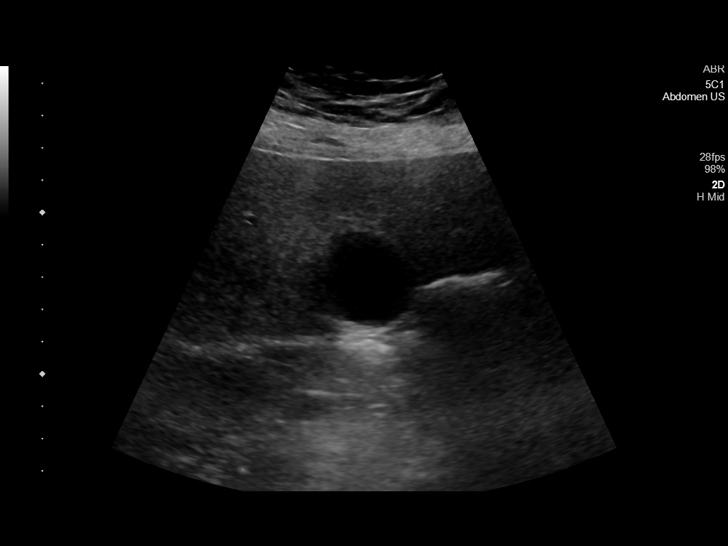
[im 7/40]
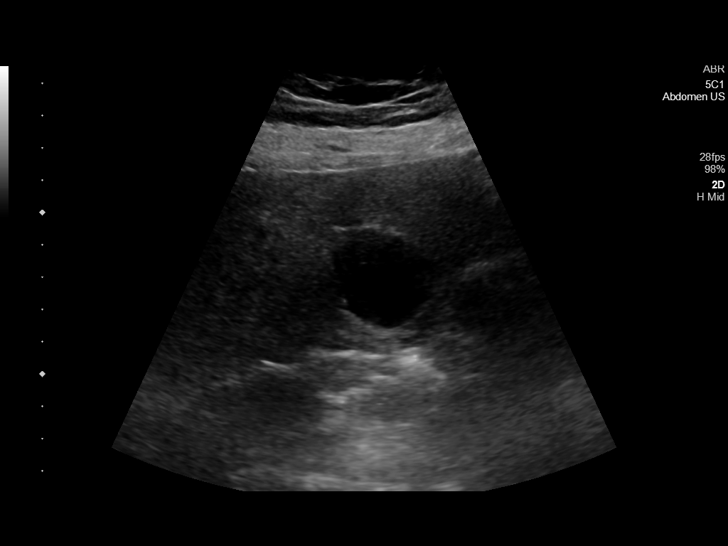
[im 10/40]
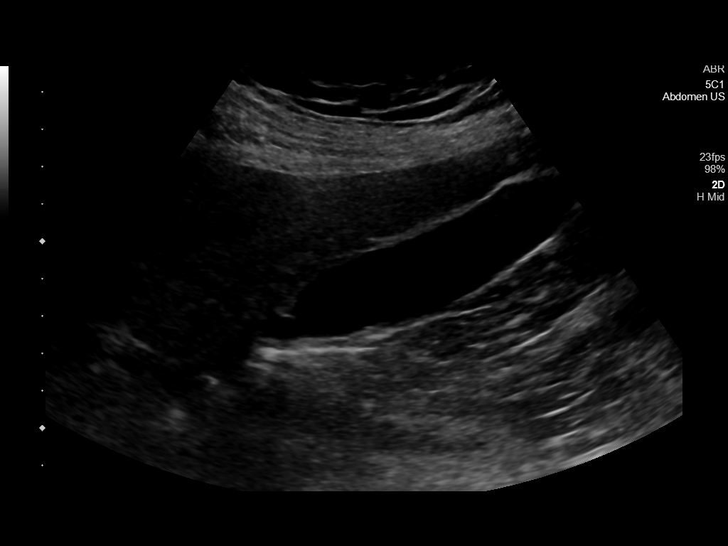
[im 14/40]
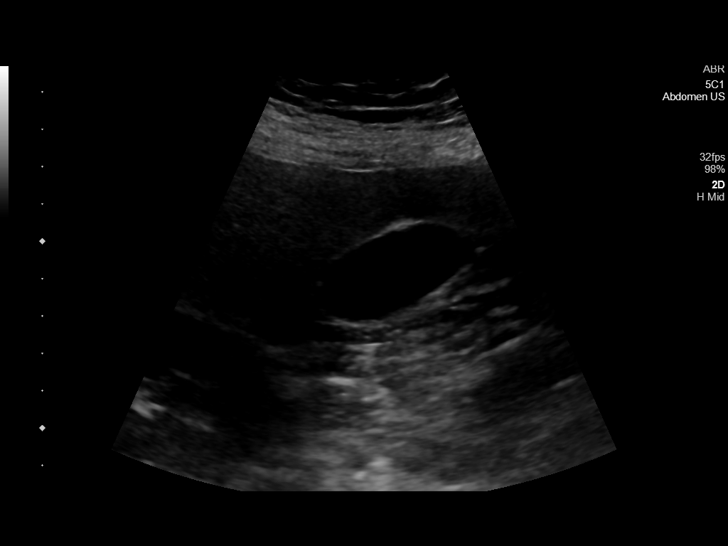
[im 15/40]
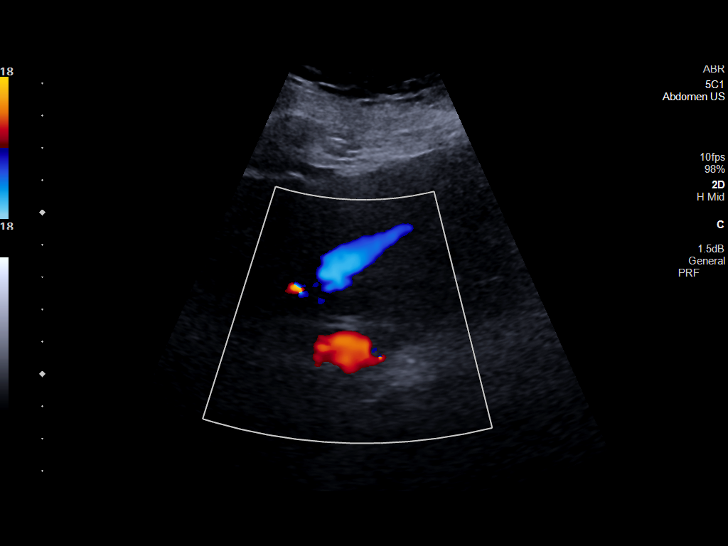
[im 18/40]
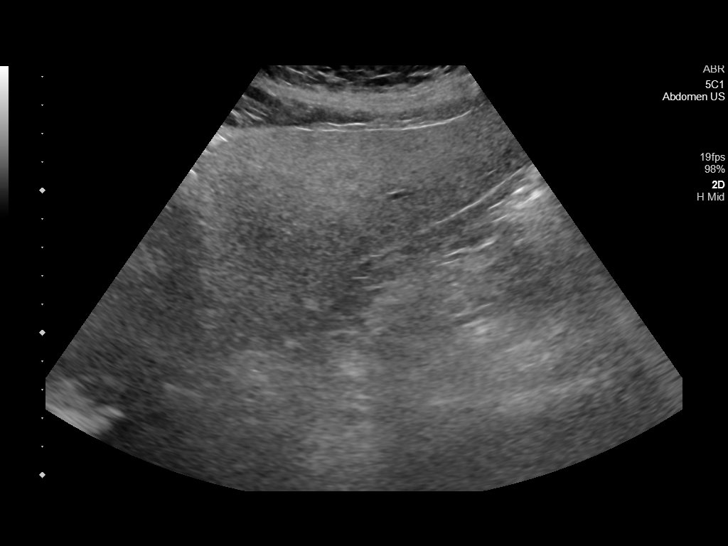
[im 22/40]
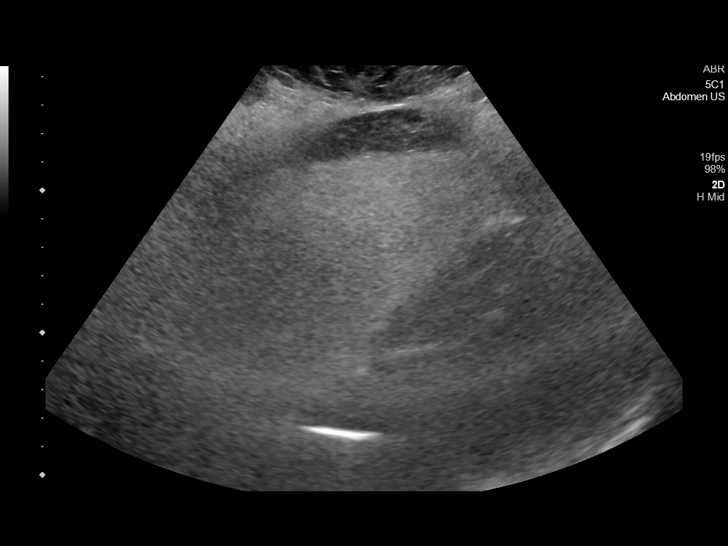
[im 25/40]
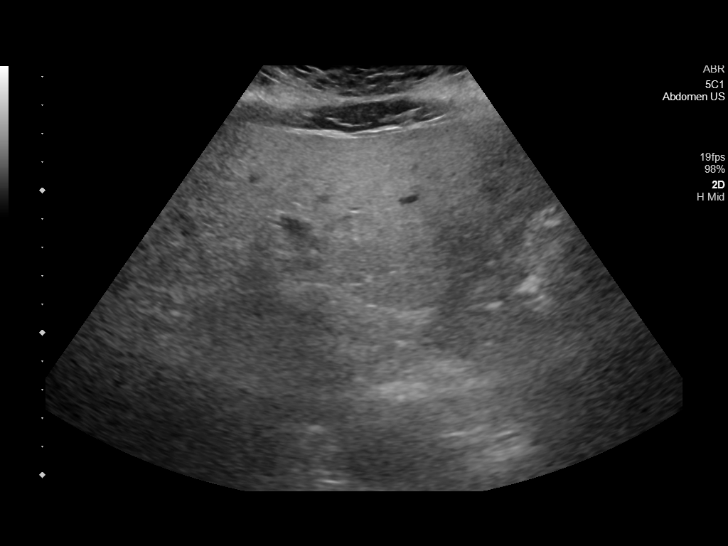
[im 27/40]
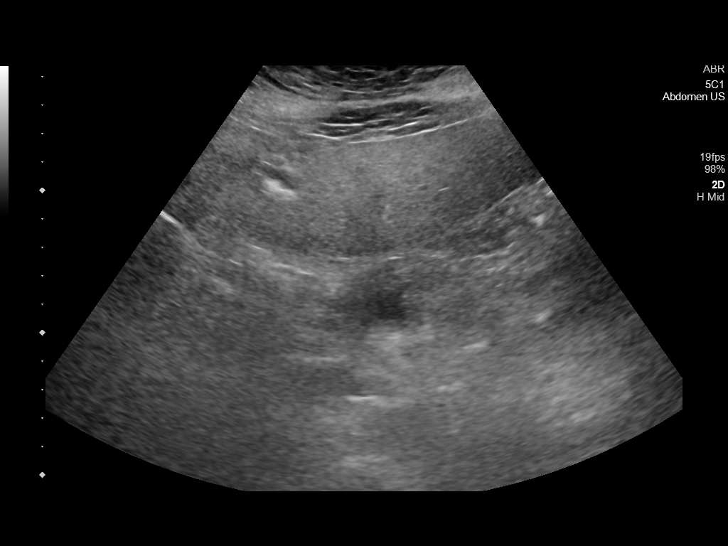
[im 30/40]
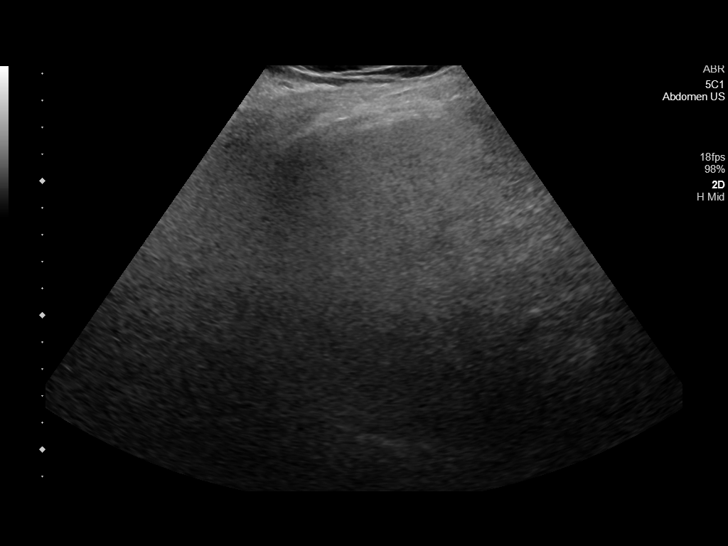
[im 33/40]
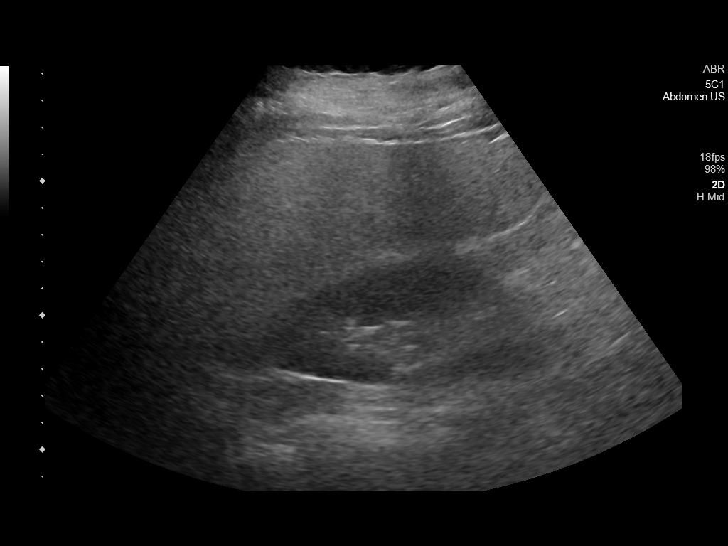
[im 36/40]
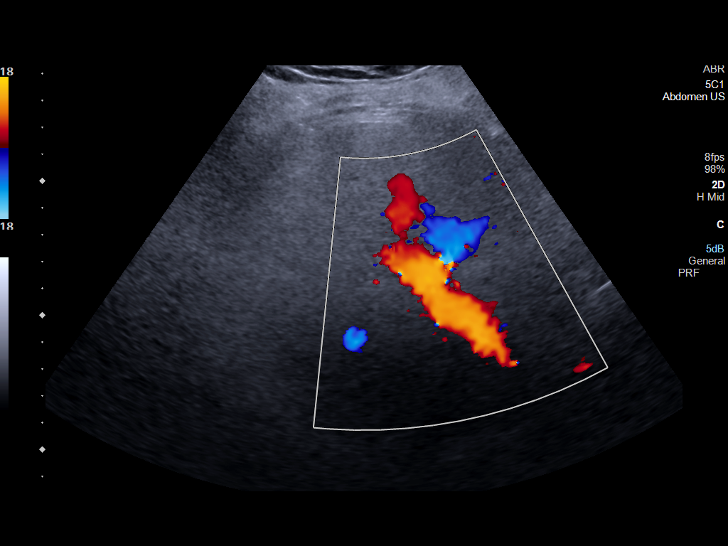
[im 40/40]
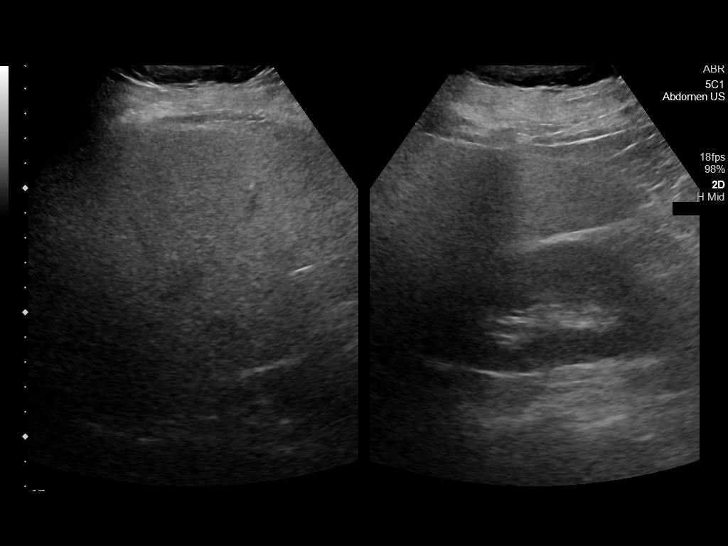

[14 of 25 positions shown; findings below may reference images not displayed]

FINDINGS: Gallbladder:

No gallstones or wall thickening visualized. No sonographic Murphy
sign noted by sonographer.

Common bile duct:

Diameter: 2 mm

Liver:

Marked increased echogenicity of hepatic parenchyma without focal
lesion on submitted images. Given the degree of hepatic steatosis
lesions could be obscured. Portal vein is patent on color Doppler
imaging with normal direction of blood flow towards the liver.

Other: None.
IMPRESSION: 1. No gallbladder pathology or signs of cholelithiasis. No signs of
biliary duct dilation.
2. Severe hepatic steatosis limits assessment.
3. Area of the pancreas not imaged.

## 2022-01-28 ENCOUNTER — Emergency Department (HOSPITAL_COMMUNITY): Payer: 59

## 2022-01-28 ENCOUNTER — Other Ambulatory Visit: Payer: Self-pay

## 2022-01-28 ENCOUNTER — Emergency Department (HOSPITAL_COMMUNITY)
Admission: EM | Admit: 2022-01-28 | Discharge: 2022-01-28 | Disposition: A | Discharge status: Home / Self Care | Payer: 59 | Attending: Emergency Medicine | Admitting: Emergency Medicine

## 2022-01-28 ENCOUNTER — Encounter (HOSPITAL_COMMUNITY): Payer: Self-pay

## 2022-01-28 DIAGNOSIS — X500XXA Overexertion from strenuous movement or load, initial encounter: Secondary | ICD-10-CM | POA: Insufficient documentation

## 2022-01-28 DIAGNOSIS — S93492A Sprain of other ligament of left ankle, initial encounter: Secondary | ICD-10-CM

## 2022-01-28 DIAGNOSIS — S99912A Unspecified injury of left ankle, initial encounter: Secondary | ICD-10-CM | POA: Diagnosis present

## 2022-01-28 DIAGNOSIS — S93432A Sprain of tibiofibular ligament of left ankle, initial encounter: Secondary | ICD-10-CM | POA: Diagnosis not present

## 2022-01-28 NOTE — ED Triage Notes (Addendum)
Pov from home. Cc of left ankle injury after cleaning out the pool. Says she slipped on some alge says she rolled her ankle and heard it pop. Says it hurts to walk on it and it is started to swell. Able to walk to triage room

## 2022-01-28 NOTE — ED Provider Notes (Signed)
[facility] EMERGENCY DEPARTMENT Provider Note   CSN: [id number] Arrival date & time: 01/28/22  1955     History  Chief Complaint  Patient presents with   Ankle Pain    Jillian Shepherd is a 36 y.o. female with no relevant medical history.  Patient presents to ED for evaluation of left ankle pain.  Patient states that this morning around 9 AM she was cleaning the pool when she slipped on algae and rolled her left ankle.  Patient reports that she inverted the ankle and felt a pop.  Patient states that she has had persistent pain since then.  Patient reports trying to take ibuprofen and apply lidocaine patches without relief of symptoms.  Patient denies any her head.  Patient denies any numbness or tingling into the left ankle.   Ankle Pain      Home Medications Prior to Admission medications   Medication Sig Start Date End Date Taking? Authorizing Provider  amLODipine (NORVASC) 10 MG tablet Take 1 tablet (10 mg total) by mouth daily. 10/11/20   [name], MD  pantoprazole (PROTONIX) 40 MG tablet Take 1 tablet (40 mg total) by mouth 2 (two) times daily. 04/08/20   [name], MD  potassium chloride SA (KLOR-CON) 20 MEQ tablet Take 1 tablet (20 mEq total) by mouth 2 (two) times daily for 5 days. 05/10/20 05/15/20  [name], MD      Allergies    Patient has no known allergies.    Review of Systems   Review of Systems  Musculoskeletal:  Positive for arthralgias.  All other systems reviewed and are negative.   Physical Exam Updated Vital Signs BP (!) 166/104 (BP Location: Right Arm)   Pulse 98   Temp 97.9 F (36.6 C) (Oral)   Resp 19   Ht '5\' 3"'$  (1.6 m)   Wt 90.7 kg   LMP 01/21/2022   SpO2 91%   BMI 35.43 kg/m  Physical Exam Vitals and nursing note reviewed.  Constitutional:      General: She is not in acute distress.    Appearance: Normal appearance. She is not ill-appearing, toxic-appearing or diaphoretic.  HENT:     Head: Normocephalic and  atraumatic.     Nose: Nose normal. No congestion.     Mouth/Throat:     Mouth: Mucous membranes are moist.     Pharynx: Oropharynx is clear.  Eyes:     Extraocular Movements: Extraocular movements intact.     Conjunctiva/sclera: Conjunctivae normal.     Pupils: Pupils are equal, round, and reactive to light.  Cardiovascular:     Rate and Rhythm: Normal rate and regular rhythm.     Pulses:          Dorsalis pedis pulses are 2+ on the left side.  Pulmonary:     Effort: Pulmonary effort is normal.     Breath sounds: Normal breath sounds. No wheezing.  Abdominal:     General: Abdomen is flat. Bowel sounds are normal.     Palpations: Abdomen is soft.     Tenderness: There is no abdominal tenderness.  Musculoskeletal:     Cervical back: Normal range of motion and neck supple. No tenderness.     Right ankle: Normal.     Left ankle: Swelling present. No deformity or ecchymosis. Tenderness present over the ATF ligament. Decreased range of motion.     Comments: Patient has swelling about the lateral malleolus.  Patient has decreased range of motion secondary  to pain.  There is tenderness about the lateral side of the ankle.  There is no overlying skin change, ecchymosis.  There is no deformity.  Skin:    General: Skin is warm and dry.     Capillary Refill: Capillary refill takes less than 2 seconds.  Neurological:     Mental Status: She is alert and oriented to person, place, and time.     ED Results / Procedures / Treatments   Labs (all labs ordered are listed, but only abnormal results are displayed) Labs Reviewed - No data to display  EKG None  Radiology DG Ankle Complete Left  Result Date: 01/28/2022 CLINICAL DATA:  Fall, pain. EXAM: LEFT ANKLE COMPLETE - 3+ VIEW COMPARISON:  None Available. FINDINGS: There is lateral soft tissue swelling. There is no evidence of fracture, dislocation, or joint effusion. There is no evidence of arthropathy scratch fat. Plantar and posterior  calcaneal spurs are present. IMPRESSION: 1. No acute fracture or dislocation. 2. Lateral soft tissue swelling. Electronically Signed   By: Ronney Asters M.D.   On: 01/28/2022 21:00    Procedures Procedures   Medications Ordered in ED Medications - No data to display  ED Course/ Medical Decision Making/ A&P                           Medical Decision Making Amount and/or Complexity of Data Reviewed Radiology: ordered.   36 year old female presents to ED for evaluation of left ankle pain.  Please see HPI for further details.  On examination, patient lateral left ankle has swelling about the lateral malleolus.  There is no overlying skin change, deformity.  The patient has reduced range of motion secondary to pain.  Patient has 2+ DP pulse in the left foot.  Neurovascularly intact.  No numbness or tingling.  Plain film imaging of patient left ankle does not show any signs of acute fracture however there is signs of soft tissue swelling.  The patient will have her ankle Ace wrapped and she will be provided with crutches.  The patient will be encouraged to continue taking ibuprofen and alternating with Tylenol at home.  The patient has been encouraged to utilize RICE method.  Return precautions given and understanding was voiced.  The patient had all of her questions answered to her satisfaction.  The patient is stable at this time for discharge home.  Final Clinical Impression(s) / ED Diagnoses Final diagnoses:  Sprain of anterior talofibular ligament of left ankle, initial encounter    Rx / DC Orders ED Discharge Orders     None         Lawana Chambers 01/28/22 2135    Noemi Chapel, MD 01/31/22 1102

## 2022-04-10 ENCOUNTER — Ambulatory Visit: Payer: 59 | Admitting: Family Medicine

## 2022-04-10 VITALS — BP 190/112 | HR 102 | Temp 98.2°F | Ht 63.0 in | Wt 189.2 lb

## 2022-04-10 DIAGNOSIS — R42 Dizziness and giddiness: Secondary | ICD-10-CM

## 2022-04-10 DIAGNOSIS — I158 Other secondary hypertension: Secondary | ICD-10-CM

## 2022-04-10 MED ORDER — MECLIZINE HCL 12.5 MG PO TABS: 25.0000 mg | ORAL_TABLET | Freq: Three times a day (TID) | ORAL | Status: DC

## 2022-04-10 MED ORDER — AMLODIPINE BESYLATE 10 MG PO TABS: 10.0000 mg | ORAL_TABLET | Freq: Every day | ORAL | 3 refills | Status: DC

## 2022-04-10 NOTE — Progress Notes (Signed)
Subjective:  Jillian Shepherd is a 36 y.o. female who presents with lightheadedness, dizziness, vomiting, blurred vision, and headache since Friday. She denies chest pain, abdominal pain, or extremity swelling. She has never taken the previously prescribed Amlodipine. has been taking ibuprofen and Tylenol for headache daily. She does not check her BP at home. Denies swelling of extremities. 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 Patient also reports vertigo whenever she stands up or changes position.  The room starts to spin.  She thought she was pregnant but she took a negative pregnancy test this morning.  As stated earlier she has been out of her blood pressure medicine for months and has not been taking it.  Coupled with all the stress she never started the spironolactone I recommended at her last visit over a year and a half ago.  At that time her potassium level was low at 3.2 and I suspected hyperaldosteronism.  She has not followed up.  Current Outpatient Medications  Medication Sig Dispense Refill   amLODipine (NORVASC) 10 MG tablet Take 1 tablet (10 mg total) by mouth daily. (Patient not taking: Reported on 04/10/2022) 90 tablet 3   pantoprazole (PROTONIX) 40 MG tablet Take 1 tablet (40 mg total) by mouth 2 (two) times daily. (Patient not taking: Reported on 04/10/2022) 60 tablet 3   No current facility-administered medications for this visit.     Objective:  BP (!) 190/112   Pulse (!) 102   Temp 98.2 F (36.8 C)   Ht '5\' 3"'$  (1.6 m)   Wt 189 lb 3.2 oz (85.8 kg)   SpO2 98%   BMI 33.52 kg/m   Appearance alert, well appearing, and in no distress and anxious. General exam BP noted to be elevated at 190/120 today in office, S1, S2 normal, no gallop, no murmur, chest clear,  no JVD, no edema.  Lungs are clear to auscultation bilaterally with no wheezes crackles or rails.  Abdomen is soft nondistended nontender with normal bowel sounds.   Assessment:   Other secondary hypertension - Plan: CBC with Differential/Platelet, TSH, COMPLETE METABOLIC PANEL WITH GFR, hCG, serum, qualitative  Vertigo - Plan: meclizine (ANTIVERT) tablet 25 mg   Plan:  I believe the patient likely has hyperaldosteronism and secondary hypertension related to this.  Based on her exam today there is no sign of endorgan damage.  Specifically she denies any chest pain or severe headache.  I believe that the dizziness upon standing is most likely vertigo.  Therefore I will start the patient on amlodipine 10 mg a day to help bring her blood pressure back to a safer level.  She is going to take meclizine 25 mg every 8 hours as needed for vertigo.  I will see the patient back in 1 week to recheck her blood pressure.  She reports that there is a chance she could be pregnant so we will check a serum beta-hCG to rule out pregnancy.  If there is no sign of pregnancy and if her potassium remains low, I will plan to start her on spironolactone for hyperaldosteronism as a cause of secondary hypertension but I will wait for lab work before doing this.

## 2022-04-11 LAB — COMPLETE METABOLIC PANEL WITH GFR
AG Ratio: 1 (calc) (ref 1.0–2.5)
ALT: 19 U/L (ref 6–29)
AST: 32 U/L — ABNORMAL HIGH (ref 10–30)
Albumin: 3.6 g/dL (ref 3.6–5.1)
Alkaline phosphatase (APISO): 101 U/L (ref 31–125)
BUN: 7 mg/dL (ref 7–25)
CO2: 28 mmol/L (ref 20–32)
Calcium: 9 mg/dL (ref 8.6–10.2)
Chloride: 97 mmol/L — ABNORMAL LOW (ref 98–110)
Creat: 0.65 mg/dL (ref 0.50–0.97)
Globulin: 3.5 g/dL (calc) (ref 1.9–3.7)
Glucose, Bld: 176 mg/dL — ABNORMAL HIGH (ref 65–99)
Potassium: 2.8 mmol/L — ABNORMAL LOW (ref 3.5–5.3)
Sodium: 140 mmol/L (ref 135–146)
Total Bilirubin: 0.9 mg/dL (ref 0.2–1.2)
Total Protein: 7.1 g/dL (ref 6.1–8.1)
eGFR: 116 mL/min/{1.73_m2} (ref >=60)

## 2022-04-11 LAB — CBC WITH DIFFERENTIAL/PLATELET
Absolute Monocytes: 288 cells/uL (ref 200–950)
Basophils Absolute: 28 cells/uL (ref 0–200)
Basophils Relative: 0.3 %
Eosinophils Absolute: 28 cells/uL (ref 15–500)
Eosinophils Relative: 0.3 %
HCT: 46 % — ABNORMAL HIGH (ref 35.0–45.0)
Hemoglobin: 16.6 g/dL — ABNORMAL HIGH (ref 11.7–15.5)
Lymphs Abs: 2204 cells/uL (ref 850–3900)
MCH: 39 pg — ABNORMAL HIGH (ref 27.0–33.0)
MCHC: 36.1 g/dL — ABNORMAL HIGH (ref 32.0–36.0)
MCV: 108 fL — ABNORMAL HIGH (ref 80.0–100.0)
MPV: 10.2 fL (ref 7.5–12.5)
Monocytes Relative: 3.1 %
Neutro Abs: 6752 cells/uL (ref 1500–7800)
Neutrophils Relative %: 72.6 %
Platelets: 200 10*3/uL (ref 140–400)
RBC: 4.26 10*6/uL (ref 3.80–5.10)
RDW: 14.8 % (ref 11.0–15.0)
Total Lymphocyte: 23.7 %
WBC: 9.3 10*3/uL (ref 3.8–10.8)

## 2022-04-11 LAB — TSH: TSH: 1.46 mIU/L

## 2022-04-11 LAB — HCG, SERUM, QUALITATIVE: Preg, Serum: NEGATIVE

## 2022-04-12 ENCOUNTER — Other Ambulatory Visit: Payer: Self-pay

## 2022-04-12 DIAGNOSIS — E876 Hypokalemia: Secondary | ICD-10-CM

## 2022-04-12 MED ORDER — SPIRONOLACTONE 25 MG PO TABS: 25.0000 mg | ORAL_TABLET | Freq: Every day | ORAL | 3 refills | Status: DC

## 2022-04-17 ENCOUNTER — Ambulatory Visit (INDEPENDENT_AMBULATORY_CARE_PROVIDER_SITE_OTHER): Payer: 59 | Admitting: Family Medicine

## 2022-04-17 VITALS — BP 132/90 | HR 108 | Temp 98.6°F | Ht 63.0 in | Wt 189.6 lb

## 2022-04-17 DIAGNOSIS — I151 Hypertension secondary to other renal disorders: Secondary | ICD-10-CM | POA: Diagnosis not present

## 2022-04-17 DIAGNOSIS — E876 Hypokalemia: Secondary | ICD-10-CM | POA: Diagnosis not present

## 2022-04-17 NOTE — Progress Notes (Signed)
Subjective:    Patient ID: Jillian Shepherd, female    DOB: 05/03/1986, 36 y.o.   MRN: [id number]  HPI Work up to date includes aldosterone level of 1, renin activity of 1.3, aldo/renin activity is low at 0.8 suspicious for pseudohyperaldosteronism.  Was given amlodipine and spironolactone 1 week ago due to elevated blood pressure and low potassium at 2.8.  Here to recheck.  Patient's blood pressure is much better today at 132/90.  Patient states that she feels much better.  She is no longer dizzy.  She no longer has a headache.  She states that she feels much better. Past Medical History:  Diagnosis Date   Abdominal pain    Cancer ([place])    squmaous cell skin cancer    Collagen vascular disease (HCC)    Obesity (BMI 35.0-39.9 without comorbidity)    Past Surgical History:  Procedure Laterality Date   ADENOIDECTOMY     COLONOSCOPY WITH PROPOFOL N/A 10/06/2020   Procedure: COLONOSCOPY WITH PROPOFOL;  Surgeon: Lin Landsman, MD;  Location: [facility] ENDOSCOPY;  Service: Gastroenterology;  Laterality: N/A;  COVID POSITIVE 08/23/2020   ESOPHAGOGASTRODUODENOSCOPY (EGD) WITH PROPOFOL N/A 10/06/2020   Procedure: ESOPHAGOGASTRODUODENOSCOPY (EGD) WITH PROPOFOL;  Surgeon: Lin Landsman, MD;  Location: [place];  Service: Gastroenterology;  Laterality: N/A;   TONSILLECTOMY     Current Outpatient Medications on File Prior to Visit  Medication Sig Dispense Refill   amLODipine (NORVASC) 10 MG tablet Take 1 tablet (10 mg total) by mouth daily. 90 tablet 3   pantoprazole (PROTONIX) 40 MG tablet Take 1 tablet (40 mg total) by mouth 2 (two) times daily. (Patient not taking: Reported on 04/10/2022) 60 tablet 3   spironolactone (ALDACTONE) 25 MG tablet Take 1 tablet (25 mg total) by mouth daily. 90 tablet 3   Current Facility-Administered Medications on File Prior to Visit  Medication Dose Route Frequency Provider Last Rate Last Admin   meclizine (ANTIVERT) tablet 25 mg  25 mg Oral Q8H Howard, Imagene Riches, FNP       No Known Allergies Social History   Socioeconomic History   Marital status: Married    Spouse name: Not on file   Number of children: Not on file   Years of education: Not on file   Highest education level: Not on file  Occupational History   Not on file  Tobacco Use   Smoking status: Every Day    Packs/day: 0.25    Years: 9.00    Total pack years: 2.25    Types: Cigarettes   Smokeless tobacco: Never  Vaping Use   Vaping Use: Never used  Substance and Sexual Activity   Alcohol use: No    Alcohol/week: 2.0 standard drinks of alcohol    Types: 2 Cans of beer per week   Drug use: No   Sexual activity: Yes    Birth control/protection: None  Other Topics Concern   Not on file  Social History Narrative   ** Merged History Encounter **       Social Determinants of Health   Financial Resource Strain: Not on file  Food Insecurity: Not on file  Transportation Needs: Not on file  Physical Activity: Not on file  Stress: Not on file  Social Connections: Not on file  Intimate Partner Violence: Not on file     Review of Systems  All other systems reviewed and are negative.      Objective:   Physical Exam Vitals reviewed.  Constitutional:  General: She is not in acute distress.    Appearance: Normal appearance. She is obese. She is not ill-appearing, toxic-appearing or diaphoretic.  HENT:     Nose: Nose normal. No congestion or rhinorrhea.     Mouth/Throat:     Pharynx: Oropharynx is clear. No oropharyngeal exudate or posterior oropharyngeal erythema.  Eyes:     General: No scleral icterus.    Conjunctiva/sclera: Conjunctivae normal.  Cardiovascular:     Rate and Rhythm: Normal rate and regular rhythm.     Pulses: Normal pulses.     Heart sounds: Normal heart sounds. No murmur heard.    No friction rub. No gallop.  Pulmonary:     Effort: Pulmonary effort is normal. No respiratory distress.     Breath sounds: Normal breath sounds. No stridor. No  wheezing, rhonchi or rales.  Chest:     Chest wall: No tenderness.  Abdominal:     General: Abdomen is flat. Bowel sounds are normal. There is no distension.     Palpations: Abdomen is soft. There is no mass.     Tenderness: There is no right CVA tenderness, left CVA tenderness, guarding or rebound.     Hernia: No hernia is present.  Musculoskeletal:     Cervical back: Neck supple.  Neurological:     Mental Status: She is alert.           Assessment & Plan:  Hypokalemia - Plan: BASIC METABOLIC PANEL WITH GFR, Magnesium  Liddle's syndrome Patient appears to have pseudo hyperaldosteronism without true hyperaldosteronism on lab work.  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  Continue spironolactone with amlodipine.  Repeat BMP to monitor potassium and also check a magnesium level to rule out other potential causes of hypokalemia.  Encouraged smoking cessation.  Recheck lab work every 6 months.  Conn syndrome and Cushing's disease have been ruled out

## 2022-04-18 LAB — BASIC METABOLIC PANEL WITH GFR
BUN/Creatinine Ratio: 10 (calc) (ref 6–22)
BUN: 5 mg/dL — ABNORMAL LOW (ref 7–25)
CO2: 32 mmol/L (ref 20–32)
Calcium: 9.4 mg/dL (ref 8.6–10.2)
Chloride: 100 mmol/L (ref 98–110)
Creat: 0.52 mg/dL (ref 0.50–0.97)
Glucose, Bld: 109 mg/dL — ABNORMAL HIGH (ref 65–99)
Potassium: 2.8 mmol/L — ABNORMAL LOW (ref 3.5–5.3)
Sodium: 141 mmol/L (ref 135–146)
eGFR: 123 mL/min/{1.73_m2} (ref >=60)

## 2022-04-18 LAB — MAGNESIUM: Magnesium: 1.7 mg/dL (ref 1.5–2.5)

## 2022-04-19 ENCOUNTER — Other Ambulatory Visit: Payer: Self-pay

## 2022-04-19 ENCOUNTER — Other Ambulatory Visit: Payer: 59

## 2022-04-19 DIAGNOSIS — E876 Hypokalemia: Secondary | ICD-10-CM

## 2022-04-19 MED ORDER — POTASSIUM CHLORIDE CRYS ER 20 MEQ PO TBCR: 20.0000 meq | EXTENDED_RELEASE_TABLET | Freq: Every day | ORAL | 3 refills | Status: DC

## 2022-05-02 ENCOUNTER — Other Ambulatory Visit: Payer: 59

## 2022-05-02 DIAGNOSIS — E669 Obesity, unspecified: Secondary | ICD-10-CM

## 2022-05-02 DIAGNOSIS — I10 Essential (primary) hypertension: Secondary | ICD-10-CM

## 2022-05-03 ENCOUNTER — Telehealth: Payer: Self-pay

## 2022-05-03 ENCOUNTER — Other Ambulatory Visit: Payer: Self-pay | Admitting: Family Medicine

## 2022-05-03 DIAGNOSIS — E876 Hypokalemia: Secondary | ICD-10-CM

## 2022-05-03 LAB — BASIC METABOLIC PANEL
BUN/Creatinine Ratio: 20 (calc) (ref 6–22)
BUN: 9 mg/dL (ref 7–25)
CO2: 31 mmol/L (ref 20–32)
Calcium: 8.9 mg/dL (ref 8.6–10.2)
Chloride: 98 mmol/L (ref 98–110)
Creat: 0.44 mg/dL — ABNORMAL LOW (ref 0.50–0.97)
Glucose, Bld: 139 mg/dL — ABNORMAL HIGH (ref 65–99)
Potassium: 2.9 mmol/L — ABNORMAL LOW (ref 3.5–5.3)
Sodium: 140 mmol/L (ref 135–146)

## 2022-05-03 LAB — HEMOGLOBIN A1C
Hgb A1c MFr Bld: 5.3 % of total Hgb (ref <5.7)
Mean Plasma Glucose: 105 mg/dL
eAG (mmol/L): 5.8 mmol/L

## 2022-05-03 MED ORDER — SPIRONOLACTONE 25 MG PO TABS: 25.0000 mg | ORAL_TABLET | Freq: Two times a day (BID) | ORAL | 3 refills | Status: DC

## 2022-05-03 MED ORDER — POTASSIUM CHLORIDE CRYS ER 20 MEQ PO TBCR: 40.0000 meq | EXTENDED_RELEASE_TABLET | Freq: Every day | ORAL | 3 refills | Status: DC

## 2022-05-03 NOTE — Telephone Encounter (Signed)
LVM for pt regarding scheduling a 1 week lab f/u.   Please schedule 1 wk lab appt w/pt when she calls back

## 2022-05-03 NOTE — Progress Notes (Signed)
Patients potassium remains low, I discussed with Dr Dennard Schaumann and will increase her Spironolactone to twice daily and double her Potassium. We suspect she has pseudo hyperaldosteronism. I would like her to return in 1 week for repeat labs.

## 2022-05-09 ENCOUNTER — Other Ambulatory Visit: Payer: Self-pay

## 2022-05-09 ENCOUNTER — Telehealth: Payer: Self-pay

## 2022-05-09 ENCOUNTER — Emergency Department (HOSPITAL_COMMUNITY): Payer: 59

## 2022-05-09 ENCOUNTER — Encounter (HOSPITAL_COMMUNITY): Payer: Self-pay | Admitting: Emergency Medicine

## 2022-05-09 ENCOUNTER — Inpatient Hospital Stay (HOSPITAL_COMMUNITY)
Admission: EM | Admit: 2022-05-09 | Discharge: 2022-05-13 | DRG: 439 | Disposition: A | Discharge status: Home / Self Care | Payer: 59 | Attending: Internal Medicine | Admitting: Internal Medicine

## 2022-05-09 DIAGNOSIS — J449 Chronic obstructive pulmonary disease, unspecified: Secondary | ICD-10-CM | POA: Diagnosis present

## 2022-05-09 DIAGNOSIS — Y900 Blood alcohol level of less than 20 mg/100 ml: Secondary | ICD-10-CM | POA: Diagnosis present

## 2022-05-09 DIAGNOSIS — K852 Alcohol induced acute pancreatitis without necrosis or infection: Secondary | ICD-10-CM | POA: Diagnosis present

## 2022-05-09 DIAGNOSIS — K859 Acute pancreatitis without necrosis or infection, unspecified: Secondary | ICD-10-CM | POA: Diagnosis present

## 2022-05-09 DIAGNOSIS — R112 Nausea with vomiting, unspecified: Secondary | ICD-10-CM

## 2022-05-09 DIAGNOSIS — Z6833 Body mass index (BMI) 33.0-33.9, adult: Secondary | ICD-10-CM | POA: Diagnosis not present

## 2022-05-09 DIAGNOSIS — R9431 Abnormal electrocardiogram [ECG] [EKG]: Secondary | ICD-10-CM | POA: Diagnosis present

## 2022-05-09 DIAGNOSIS — R718 Other abnormality of red blood cells: Secondary | ICD-10-CM

## 2022-05-09 DIAGNOSIS — R0902 Hypoxemia: Secondary | ICD-10-CM | POA: Diagnosis present

## 2022-05-09 DIAGNOSIS — Z85828 Personal history of other malignant neoplasm of skin: Secondary | ICD-10-CM | POA: Diagnosis not present

## 2022-05-09 DIAGNOSIS — F17210 Nicotine dependence, cigarettes, uncomplicated: Secondary | ICD-10-CM | POA: Diagnosis present

## 2022-05-09 DIAGNOSIS — J90 Pleural effusion, not elsewhere classified: Secondary | ICD-10-CM | POA: Diagnosis present

## 2022-05-09 DIAGNOSIS — J9811 Atelectasis: Secondary | ICD-10-CM | POA: Diagnosis present

## 2022-05-09 DIAGNOSIS — E876 Hypokalemia: Secondary | ICD-10-CM | POA: Diagnosis present

## 2022-05-09 DIAGNOSIS — K219 Gastro-esophageal reflux disease without esophagitis: Secondary | ICD-10-CM | POA: Diagnosis present

## 2022-05-09 DIAGNOSIS — R739 Hyperglycemia, unspecified: Secondary | ICD-10-CM | POA: Diagnosis present

## 2022-05-09 DIAGNOSIS — R109 Unspecified abdominal pain: Secondary | ICD-10-CM

## 2022-05-09 DIAGNOSIS — Z79899 Other long term (current) drug therapy: Secondary | ICD-10-CM

## 2022-05-09 DIAGNOSIS — E662 Morbid (severe) obesity with alveolar hypoventilation: Secondary | ICD-10-CM | POA: Diagnosis present

## 2022-05-09 DIAGNOSIS — R55 Syncope and collapse: Secondary | ICD-10-CM | POA: Diagnosis present

## 2022-05-09 DIAGNOSIS — F1010 Alcohol abuse, uncomplicated: Secondary | ICD-10-CM

## 2022-05-09 DIAGNOSIS — E669 Obesity, unspecified: Secondary | ICD-10-CM | POA: Diagnosis present

## 2022-05-09 DIAGNOSIS — R1010 Upper abdominal pain, unspecified: Secondary | ICD-10-CM

## 2022-05-09 DIAGNOSIS — I10 Essential (primary) hypertension: Secondary | ICD-10-CM | POA: Diagnosis present

## 2022-05-09 DIAGNOSIS — Z8249 Family history of ischemic heart disease and other diseases of the circulatory system: Secondary | ICD-10-CM | POA: Diagnosis not present

## 2022-05-09 DIAGNOSIS — E869 Volume depletion, unspecified: Secondary | ICD-10-CM | POA: Diagnosis present

## 2022-05-09 LAB — BASIC METABOLIC PANEL
Anion gap: 16 — ABNORMAL HIGH (ref 5–15)
BUN: 9 mg/dL (ref 6–20)
CO2: 27 mmol/L (ref 22–32)
Calcium: 9.2 mg/dL (ref 8.9–10.3)
Chloride: 97 mmol/L — ABNORMAL LOW (ref 98–111)
Creatinine, Ser: 0.51 mg/dL (ref 0.44–1.00)
GFR, Estimated: >60 mL/min (ref >60)
Glucose, Bld: 150 mg/dL — ABNORMAL HIGH (ref 70–99)
Potassium: 2.5 mmol/L — CL (ref 3.5–5.1)
Sodium: 140 mmol/L (ref 135–145)

## 2022-05-09 LAB — HEPATIC FUNCTION PANEL
ALT: 35 U/L (ref 0–44)
AST: 93 U/L — ABNORMAL HIGH (ref 15–41)
Albumin: 3.9 g/dL (ref 3.5–5.0)
Alkaline Phosphatase: 102 U/L (ref 38–126)
Bilirubin, Direct: 0.5 mg/dL — ABNORMAL HIGH (ref 0.0–0.2)
Indirect Bilirubin: 0.9 mg/dL (ref 0.3–0.9)
Total Bilirubin: 1.4 mg/dL — ABNORMAL HIGH (ref 0.3–1.2)
Total Protein: 8.4 g/dL — ABNORMAL HIGH (ref 6.5–8.1)

## 2022-05-09 LAB — LIPASE, BLOOD: Lipase: 927 U/L — ABNORMAL HIGH (ref 11–51)

## 2022-05-09 LAB — CBC
HCT: 49.3 % — ABNORMAL HIGH (ref 36.0–46.0)
Hemoglobin: 17.6 g/dL — ABNORMAL HIGH (ref 12.0–15.0)
MCH: 38.2 pg — ABNORMAL HIGH (ref 26.0–34.0)
MCHC: 35.7 g/dL (ref 30.0–36.0)
MCV: 106.9 fL — ABNORMAL HIGH (ref 80.0–100.0)
Platelets: 195 10*3/uL (ref 150–400)
RBC: 4.61 MIL/uL (ref 3.87–5.11)
RDW: 15.1 % (ref 11.5–15.5)
WBC: 12.1 10*3/uL — ABNORMAL HIGH (ref 4.0–10.5)
nRBC: 0 % (ref 0.0–0.2)

## 2022-05-09 LAB — ETHANOL: Alcohol, Ethyl (B): <10 mg/dL (ref <10)

## 2022-05-09 LAB — POC URINE PREG, ED: Preg Test, Ur: NEGATIVE

## 2022-05-09 LAB — TROPONIN I (HIGH SENSITIVITY)
Troponin I (High Sensitivity): 3 ng/L (ref <18)
Troponin I (High Sensitivity): 3 ng/L (ref <18)

## 2022-05-09 LAB — MAGNESIUM: Magnesium: 1.5 mg/dL — ABNORMAL LOW (ref 1.7–2.4)

## 2022-05-09 MED ORDER — POTASSIUM CHLORIDE CRYS ER 20 MEQ PO TBCR
40.0000 meq | EXTENDED_RELEASE_TABLET | Freq: Once | ORAL | Status: AC
  Administered 2022-05-09: 40 meq via ORAL
  Filled 2022-05-09: qty 2

## 2022-05-09 MED ORDER — LORAZEPAM 2 MG/ML IJ SOLN
1.0000 mg | Freq: Once | INTRAMUSCULAR | Status: AC
  Administered 2022-05-09: 1 mg via INTRAVENOUS
  Filled 2022-05-09: qty 1

## 2022-05-09 MED ORDER — FENTANYL CITRATE PF 50 MCG/ML IJ SOSY
50.0000 ug | PREFILLED_SYRINGE | Freq: Once | INTRAMUSCULAR | Status: AC
  Administered 2022-05-09: 50 ug via INTRAVENOUS
  Filled 2022-05-09: qty 1

## 2022-05-09 MED ORDER — MORPHINE SULFATE (PF) 4 MG/ML IV SOLN
4.0000 mg | Freq: Once | INTRAVENOUS | Status: AC
  Administered 2022-05-09: 4 mg via INTRAVENOUS
  Filled 2022-05-09: qty 1

## 2022-05-09 MED ORDER — LACTATED RINGERS IV SOLN: INTRAVENOUS | Status: DC

## 2022-05-09 MED ORDER — IOHEXOL 300 MG/ML  SOLN
100.0000 mL | Freq: Once | INTRAMUSCULAR | Status: AC | PRN
  Administered 2022-05-09: 100 mL via INTRAVENOUS

## 2022-05-09 MED ORDER — MAGNESIUM SULFATE 2 GM/50ML IV SOLN
2.0000 g | Freq: Once | INTRAVENOUS | Status: AC
  Administered 2022-05-09: 2 g via INTRAVENOUS
  Filled 2022-05-09: qty 50

## 2022-05-09 MED ORDER — SODIUM CHLORIDE 0.9 % IV BOLUS
1000.0000 mL | Freq: Once | INTRAVENOUS | Status: AC
  Administered 2022-05-09: 1000 mL via INTRAVENOUS

## 2022-05-09 MED ORDER — POTASSIUM CHLORIDE 10 MEQ/100ML IV SOLN
10.0000 meq | INTRAVENOUS | Status: AC
  Administered 2022-05-09 (×3): 10 meq via INTRAVENOUS
  Filled 2022-05-09 (×3): qty 100

## 2022-05-09 MED ORDER — HYDROMORPHONE HCL 1 MG/ML IJ SOLN
0.5000 mg | INTRAMUSCULAR | Status: DC | PRN
  Administered 2022-05-09 – 2022-05-10 (×5): 0.5 mg via INTRAVENOUS
  Filled 2022-05-09 (×5): qty 0.5

## 2022-05-09 NOTE — Telephone Encounter (Signed)
Pt states she passed out at work today and is having chest pain that is radiating into her stomach. Advised pt to go directly to the ER and be evaluated. Pt verbalized understanding of all.

## 2022-05-09 NOTE — ED Notes (Addendum)
Pt placed on 2 L oxygen via [place] due to SpO2 dropping to 88%--SpO2 is now 94%

## 2022-05-09 NOTE — ED Triage Notes (Signed)
Pt to ER states she passed out at work for several seconds and has been having SP since that time. Pt states pain is mid sternal and radiates into upper abdomen.  Pt states pain is sharp and pressure in nature.  Pt has recently been started on meds for HTN.  Pt denies SHOB, states nausea.

## 2022-05-09 NOTE — ED Notes (Addendum)
Pt still requiring 2LNC, sats remain 84% when trialed on RA

## 2022-05-09 NOTE — ED Provider Notes (Signed)
[facility] EMERGENCY DEPARTMENT Provider Note   CSN: [id number] Arrival date & time: 05/09/22  1225     History  Chief Complaint  Patient presents with   Chest Pain    Jillian Shepherd is a 36 y.o. female.   Chest Pain    Patient has a history of collagen vascular disease, pancreatitis, daily alcohol use. Pt had an episode of syncope at work.  When she woke up she was having pain in her chest and abdomen.  THe pain is sharp, waxes and wanes.  One episode of vomiting and nausea.  No diarrhea.  No trouble urinating.  Pt started on a blood pressure med spironolactone recently.  Patient denies any history of heart disease DVT or PE Home Medications Prior to Admission medications   Medication Sig Start Date End Date Taking? Authorizing Provider  amLODipine (NORVASC) 10 MG tablet Take 1 tablet (10 mg total) by mouth daily. 04/10/22   Rubie Maid, FNP  pantoprazole (PROTONIX) 40 MG tablet Take 1 tablet (40 mg total) by mouth 2 (two) times daily. Patient not taking: Reported on 04/10/2022 04/08/20   [name], MD  potassium chloride SA (KLOR-CON M) 20 MEQ tablet Take 2 tablets (40 mEq total) by mouth daily. 05/03/22   Rubie Maid, FNP  spironolactone (ALDACTONE) 25 MG tablet Take 1 tablet (25 mg total) by mouth 2 (two) times daily. 05/03/22   Rubie Maid, FNP      Allergies    Patient has no known allergies.    Review of Systems   Review of Systems  Cardiovascular:  Positive for chest pain.    Physical Exam Updated Vital Signs BP (!) 148/91   Pulse 99   Temp (!) 97.5 F (36.4 C) (Oral)   Resp 17   Ht 1.6 m ('5\' 3"'$ )   Wt 85.3 kg   LMP 05/01/2022   SpO2 95%   BMI 33.30 kg/m  Physical Exam Vitals and nursing note reviewed.  Constitutional:      General: She is not in acute distress.    Appearance: She is well-developed.  HENT:     Head: Normocephalic and atraumatic.     Right Ear: External ear normal.     Left Ear: External ear normal.  Eyes:      General: No scleral icterus.       Right eye: No discharge.        Left eye: No discharge.     Conjunctiva/sclera: Conjunctivae normal.  Neck:     Trachea: No tracheal deviation.  Cardiovascular:     Rate and Rhythm: Normal rate and regular rhythm.  Pulmonary:     Effort: Pulmonary effort is normal. No respiratory distress.     Breath sounds: Normal breath sounds. No stridor. No wheezing or rales.  Abdominal:     General: Bowel sounds are normal. There is no distension.     Palpations: Abdomen is soft.     Tenderness: There is abdominal tenderness in the epigastric area. There is no guarding or rebound.  Musculoskeletal:        General: No tenderness or deformity.     Cervical back: Neck supple.  Skin:    General: Skin is warm and dry.     Findings: No rash.  Neurological:     General: No focal deficit present.     Mental Status: She is alert.     Cranial Nerves: No cranial nerve deficit (no facial droop, extraocular movements intact, no  slurred speech).     Sensory: No sensory deficit.     Motor: No abnormal muscle tone or seizure activity.     Coordination: Coordination normal.  Psychiatric:        Mood and Affect: Mood normal.     ED Results / Procedures / Treatments   Labs (all labs ordered are listed, but only abnormal results are displayed) Labs Reviewed  BASIC METABOLIC PANEL - Abnormal; Notable for the following components:      Result Value   Potassium 2.5 (*)    Chloride 97 (*)    Glucose, Bld 150 (*)    Anion gap 16 (*)    All other components within normal limits  CBC - Abnormal; Notable for the following components:   WBC 12.1 (*)    Hemoglobin 17.6 (*)    HCT 49.3 (*)    MCV 106.9 (*)    MCH 38.2 (*)    All other components within normal limits  LIPASE, BLOOD  MAGNESIUM  HEPATIC FUNCTION PANEL  ETHANOL  POC URINE PREG, ED  TROPONIN I (HIGH SENSITIVITY)  TROPONIN I (HIGH SENSITIVITY)    EKG EKG Interpretation  Date/Time:  Wednesday May 09 2022 12:34:13 EDT Ventricular Rate:  98 PR Interval:  140 QRS Duration: 102 QT Interval:  402 QTC Calculation: 513 R Axis:   -20 Text Interpretation: Normal sinus rhythm Cannot rule out Anterior infarct , age undetermined Abnormal ECG No previous ECGs available Confirmed by Dorie Rank [phone]) on 05/09/2022 12:59:41 PM  Radiology DG Chest 2 View  Result Date: 05/09/2022 CLINICAL DATA:  Chest pain EXAM: CHEST - 2 VIEW COMPARISON:  None Available. FINDINGS: The heart size and mediastinal contours are within normal limits. Streaky opacity within the lingula. Lungs are otherwise clear. No pleural effusion or pneumothorax. The visualized skeletal structures are unremarkable. IMPRESSION: Streaky opacity within the lingula, which may represent atelectasis versus developing infiltrate. Electronically Signed   By: Davina Poke D.O.   On: 05/09/2022 13:30    Procedures Procedures    Medications Ordered in ED Medications  potassium chloride 10 mEq in 100 mL IVPB (has no administration in time range)  potassium chloride SA (KLOR-CON M) CR tablet 40 mEq (has no administration in time range)  morphine (PF) 4 MG/ML injection 4 mg (has no administration in time range)    ED Course/ Medical Decision Making/ A&P Clinical Course as of 05/09/22 1508  Wed May 09, 2022  5462 Basic metabolic panel(!!) hypokalemia [JK]  1507 CBC(!) Wbc and hgb elevated [JK]  1507 Troponin I (High Sensitivity) normal [JK]  1507 DG Chest 2 View Cxr with streaky opacity in the lingula [JK]    Clinical Course User Index [JK] Dorie Rank, MD                           Medical Decision Making Differential diagnosis includes but not limited to coronary artery disease, pneumonia, cardiac dysrhythmia, hepatitis, pancreatitis  Syncopal event could be related to anemia dehydration electrolyte abnormality cardiac dysrhythmia  Patient has history of recurrent pancreatitis.  She admits to daily alcohol use.  Possible she  could be having acute pancreatitis attack  Problems Addressed: Hypokalemia: acute illness or injury that poses a threat to life or bodily functions Syncope, unspecified syncope type: acute illness or injury  Amount and/or Complexity of Data Reviewed Labs: ordered. Radiology: ordered.  Risk Prescription drug management.   Labs iv fluids, pain meds ordered.  Patient noted to be hypokalemic with a potassium of 2.5.  She also has an elevated anion gap.  We will order potassium replacement.  Added on LFTs lipase and hepatic function panel.  Patient may end up requiring admission to the hospital for her syncopal episode and hypokalemia.  Care turned over to Dr Sabra Heck at shift change         Final Clinical Impression(s) / ED Diagnoses Final diagnoses:  Hypokalemia  Syncope, unspecified syncope type    Rx / DC Orders ED Discharge Orders     None         Dorie Rank, MD 05/09/22 [phone]

## 2022-05-09 NOTE — ED Provider Notes (Signed)
Discussed the care with Dr. Josephine Cables, he will admit the patient to hospital   Noemi Chapel, MD 05/09/22 1952

## 2022-05-09 NOTE — H&P (Addendum)
History and Physical    Patient: Jillian Shepherd WUJ:[id number] DOB: [birth year] DOA: 05/09/2022 DOS: the patient was seen and examined on 05/10/2022 PCP: [name], MD  Patient coming from: Home  Chief Complaint:  Chief Complaint  Patient presents with   Chest Pain   HPI: Jillian Shepherd is a 36 y.o. female with medical history significant of alcohol abuse, hypertension, GERD, obesity who presents to the emergency department due to abdominal pain which started this morning.  Patient states that while at work today, she fainted for a few seconds and quickly recovered without any postictal state.  This was followed by abdominal pain in the epigastric area with radiation to the back and midsternal area, pain was described as sharp, constant with variable degree of pain with maximum pain at 8/10 on pain scale.  She also complained of nausea and nonbloody vomiting.  Last alcohol drink was yesterday (10/3)  ED Course:  In the emergency department, BP was 155/100, but other vital signs were within normal range.  Work-up in the ED showed leukocytosis, hypokalemia, hyperglycemia, magnesium 1.5, lipase 927, alcohol level was less than 10, troponin x2 was flat at 3. CT abdomen and pelvis with contrast showed findings consistent with acute pancreatitis.  No definite pseudocyst formation is noted. She was treated with IV fentanyl and, Ativan, magnesium was replenished, morphine was given due to pain potassium was replenished and IV hydration was provided.  Hospitalist was asked to admit patient for further evaluation and management.  Review of Systems: Review of systems as noted in the HPI. All other systems reviewed and are negative.   Past Medical History:  Diagnosis Date   Abdominal pain    Cancer (HCC)    squmaous cell skin cancer    Collagen vascular disease (HCC)    Obesity (BMI 35.0-39.9 without comorbidity)    Past Surgical History:  Procedure Laterality Date   ADENOIDECTOMY      COLONOSCOPY WITH PROPOFOL N/A 10/06/2020   Procedure: COLONOSCOPY WITH PROPOFOL;  Surgeon: Toney Reil, MD;  Location: [facility] ENDOSCOPY;  Service: Gastroenterology;  Laterality: N/A;  COVID POSITIVE 08/23/2020   ESOPHAGOGASTRODUODENOSCOPY (EGD) WITH PROPOFOL N/A 10/06/2020   Procedure: ESOPHAGOGASTRODUODENOSCOPY (EGD) WITH PROPOFOL;  Surgeon: Toney Reil, MD;  Location: [facility] ENDOSCOPY;  Service: Gastroenterology;  Laterality: N/A;   TONSILLECTOMY      Social History:  reports that she has been smoking cigarettes. She has a 2.25 pack-year smoking history. She has never used smokeless tobacco. She reports current alcohol use of about 2.0 standard drinks of alcohol per week. She reports that she does not use drugs.   No Known Allergies  Family History  Problem Relation Age of Onset   Melanoma Paternal Grandmother        of skin   Hypertension Father    Anxiety disorder Father    Depression Father      Prior to Admission medications   Medication Sig Start Date End Date Taking? Authorizing Provider  amLODipine (NORVASC) 10 MG tablet Take 1 tablet (10 mg total) by mouth daily. 04/10/22  Yes Howard, Amber S, FNP  potassium chloride SA (KLOR-CON M) 20 MEQ tablet Take 2 tablets (40 mEq total) by mouth daily. 05/03/22  Yes Kurtis Bushman S, FNP  spironolactone (ALDACTONE) 25 MG tablet Take 1 tablet (25 mg total) by mouth 2 (two) times daily. 05/03/22  Yes Howard, Amber S, FNP  pantoprazole (PROTONIX) 40 MG tablet Take 1 tablet (40 mg total) by mouth 2 (two) times daily.  Patient not taking: Reported on 05/09/2022 04/08/20   [name], MD    Physical Exam: BP (!) 168/109 (BP Location: Right Arm)   Pulse (!) 110   Temp 97.9 F (36.6 C) (Oral)   Resp 18   Ht 5\' 3"  (1.6 m)   Wt 86.3 kg   LMP 05/01/2022   SpO2 93%   BMI 33.70 kg/m   General: 36 y.o. year-old female well developed well nourished in no acute distress.  Alert and oriented x3. HEENT: NCAT, EOMI Neck: Supple, trachea  medial Cardiovascular: Regular rate and rhythm with no rubs or gallops.  No thyromegaly or JVD noted.  No lower extremity edema. 2/4 pulses in all 4 extremities. Respiratory: Clear to auscultation with no wheezes or rales. Good inspiratory effort. Abdomen: Soft, tender to palpation of epigastrium without guarding.  Nondistended with normal bowel sounds x4 quadrants. Muskuloskeletal: No cyanosis, clubbing or edema noted bilaterally Neuro: CN II-XII intact, strength 5/5 x 4, sensation, reflexes intact Skin: No ulcerative lesions noted or rashes Psychiatry: Judgement and insight appear normal. Mood is appropriate for condition and setting          Labs on Admission:  Basic Metabolic Panel: Recent Labs  Lab 05/09/22 1301 05/09/22 1503  NA 140  --   K 2.5*  --   CL 97*  --   CO2 27  --   GLUCOSE 150*  --   BUN 9  --   CREATININE 0.51  --   CALCIUM 9.2  --   MG  --  1.5*   Liver Function Tests: Recent Labs  Lab 05/09/22 1503  AST 93*  ALT 35  ALKPHOS 102  BILITOT 1.4*  PROT 8.4*  ALBUMIN 3.9   Recent Labs  Lab 05/09/22 1503  LIPASE 927*   No results for input(s): "AMMONIA" in the last 168 hours. CBC: Recent Labs  Lab 05/09/22 1301  WBC 12.1*  HGB 17.6*  HCT 49.3*  MCV 106.9*  PLT 195   Cardiac Enzymes: No results for input(s): "CKTOTAL", "CKMB", "CKMBINDEX", "TROPONINI" in the last 168 hours.  BNP (last 3 results) No results for input(s): "BNP" in the last 8760 hours.  ProBNP (last 3 results) No results for input(s): "PROBNP" in the last 8760 hours.  CBG: No results for input(s): "GLUCAP" in the last 168 hours.  Radiological Exams on Admission: CT ABDOMEN PELVIS W CONTRAST  Result Date: 05/09/2022 CLINICAL DATA:  Acute generalized abdominal pain. EXAM: CT ABDOMEN AND PELVIS WITH CONTRAST TECHNIQUE: Multidetector CT imaging of the abdomen and pelvis was performed using the standard protocol following bolus administration of intravenous contrast. RADIATION  DOSE REDUCTION: This exam was performed according to the departmental dose-optimization program which includes automated exposure control, adjustment of the mA and/or kV according to patient size and/or use of iterative reconstruction technique. CONTRAST:  OMNIPAQUE IOHEXOL 300 MG/ML  SOLN COMPARISON:  August 08, 2020. FINDINGS: Lower chest: No acute abnormality. Hepatobiliary: No focal liver abnormality is seen. No gallstones, gallbladder wall thickening, or biliary dilatation. Pancreas: Large amount of inflammatory stranding is noted around the pancreas consistent with acute pancreatitis. No definite pseudocyst formation or ductal dilatation is noted. Spleen: Normal in size without focal abnormality. Adrenals/Urinary Tract: Adrenal glands are unremarkable. Kidneys are normal, without renal calculi, focal lesion, or hydronephrosis. Bladder is unremarkable. Stomach/Bowel: Stomach is within normal limits. Appendix appears normal. No evidence of bowel wall thickening, distention, or inflammatory changes. Vascular/Lymphatic: No significant vascular findings are present. No enlarged abdominal or  pelvic lymph nodes. Reproductive: Uterus and bilateral adnexa are unremarkable. Other: No abdominal wall hernia or abnormality. No abdominopelvic ascites. Musculoskeletal: No acute or significant osseous findings. IMPRESSION: Findings consistent with acute pancreatitis. No definite pseudocyst formation is noted. Electronically Signed   By: Lupita Raider M.D.   On: 05/09/2022 18:19   DG Chest 2 View  Result Date: 05/09/2022 CLINICAL DATA:  Chest pain EXAM: CHEST - 2 VIEW COMPARISON:  None Available. FINDINGS: The heart size and mediastinal contours are within normal limits. Streaky opacity within the lingula. Lungs are otherwise clear. No pleural effusion or pneumothorax. The visualized skeletal structures are unremarkable. IMPRESSION: Streaky opacity within the lingula, which may represent atelectasis versus  developing infiltrate. Electronically Signed   By: Duanne Guess D.O.   On: 05/09/2022 13:30    EKG: I independently viewed the EKG done and my findings are as followed: Normal sinus rhythm at a rate of 98 bpm with QTc 513 ms  Assessment/Plan Present on Admission:  Acute pancreatitis  Obesity  Principal Problem:   Acute pancreatitis Active Problems:   Essential hypertension   Obesity   Abdominal pain   Nausea & vomiting   Syncope   Hypokalemia   Hypomagnesemia   Prolonged QT interval   Elevated MCV   Hyperglycemia   Alcohol abuse  Abdominal pain, nausea and vomiting secondary to acute pancreatitis BISAP Score = 0 points (<1 % risk of mortality) Lipase 927 CT abdomen and pelvis was suggestive of acute pancreatitis Continue IV Compazine p.r.n Continue IV Dilaudid p.r.n for pain Continue Protonix Continue IV LR at 155ml/Hr Continue full liquid diet with plan to advance diet as tolerated RUQ U/S in the morning to investigate biliary etiology (gallstone and bile duct dilatation  Syncope Patient reported passing out for few seconds without postictal state/confusion Continue telemetry and watch for arrhythmias Troponins x 2 was 3  EKG personally reviewed showed normal sinus rhythm at a rate of 98 bpm with QTc 513 ms Echocardiogram will be done to rule out significant aortic stenosis or other outflow obstruction, and also to evaluate EF and to rule out segmental/Regional wall motion abnormalities.  Carotid artery Dopplers will be done to rule out hemodynamically significant stenosis  Hypokalemia K+ is 2.5 K+ will be replenished Please monitor for AM K+ for further replenishmemnt  Prolonged QT interval QTc Avoid QT prolonging drugs Magnesium 1.5, this will be replenished K+ 2.5, this will be replenished Repeat EKG in the morning  Leukocytosis possibly secondary to a reactive process WBC 12.1, continue to monitor WBC with morning labs  Elevated MCV MCV  106.9 Vitamin B12 and folate levels will be checked  Hyperglycemia possibly reactive CBG 150, patient has no history of diabetes Continue to monitor blood glucose level  Hypomagnesemia Magnesium 2.5, this was replenished  Alcohol abuse Patient was counseled on tobacco abuse cessation  Obesity (BMI 33.70) Diet and lifestyle modification  Essential hypertension  DVT prophylaxis:Lovenox  Code Status: Full code  Consults: None  Family Communication: None at bedside  Severity of Illness: The appropriate patient status for this patient is INPATIENT. Inpatient status is judged to be reasonable and necessary in order to provide the required intensity of service to ensure the patient's safety. The patient's presenting symptoms, physical exam findings, and initial radiographic and laboratory data in the context of their chronic comorbidities is felt to place them at high risk for further clinical deterioration. Furthermore, it is not anticipated that the patient will be medically stable for discharge  from the hospital within 2 midnights of admission.   * I certify that at the point of admission it is my clinical judgment that the patient will require inpatient hospital care spanning beyond 2 midnights from the point of admission due to high intensity of service, high risk for further deterioration and high frequency of surveillance required.*  Author: Frankey Shown, DO 05/10/2022 2:34 AM  For on call review [email or web address].

## 2022-05-10 ENCOUNTER — Other Ambulatory Visit: Payer: 59

## 2022-05-10 ENCOUNTER — Inpatient Hospital Stay (HOSPITAL_COMMUNITY): Payer: 59

## 2022-05-10 ENCOUNTER — Other Ambulatory Visit: Payer: Self-pay

## 2022-05-10 DIAGNOSIS — E876 Hypokalemia: Principal | ICD-10-CM

## 2022-05-10 DIAGNOSIS — R739 Hyperglycemia, unspecified: Secondary | ICD-10-CM

## 2022-05-10 DIAGNOSIS — R109 Unspecified abdominal pain: Secondary | ICD-10-CM

## 2022-05-10 DIAGNOSIS — R55 Syncope and collapse: Secondary | ICD-10-CM

## 2022-05-10 DIAGNOSIS — R9431 Abnormal electrocardiogram [ECG] [EKG]: Secondary | ICD-10-CM

## 2022-05-10 DIAGNOSIS — K852 Alcohol induced acute pancreatitis without necrosis or infection: Secondary | ICD-10-CM

## 2022-05-10 DIAGNOSIS — R718 Other abnormality of red blood cells: Secondary | ICD-10-CM

## 2022-05-10 DIAGNOSIS — F1010 Alcohol abuse, uncomplicated: Secondary | ICD-10-CM

## 2022-05-10 DIAGNOSIS — R112 Nausea with vomiting, unspecified: Secondary | ICD-10-CM

## 2022-05-10 LAB — ECHOCARDIOGRAM COMPLETE
AR max vel: 2.49 cm2
AV Area VTI: 2.85 cm2
AV Area mean vel: 2.34 cm2
AV Mean grad: 4 mmHg
AV Peak grad: 7.7 mmHg
Ao pk vel: 1.39 m/s
Area-P 1/2: 7.82 cm2
Height: 63 in
MV VTI: 3.95 cm2
S' Lateral: 2.9 cm
Weight: 3044.11 oz

## 2022-05-10 LAB — CBC
HCT: 48.7 % — ABNORMAL HIGH (ref 36.0–46.0)
Hemoglobin: 17.4 g/dL — ABNORMAL HIGH (ref 12.0–15.0)
MCH: 38.6 pg — ABNORMAL HIGH (ref 26.0–34.0)
MCHC: 35.7 g/dL (ref 30.0–36.0)
MCV: 108 fL — ABNORMAL HIGH (ref 80.0–100.0)
Platelets: 196 10*3/uL (ref 150–400)
RBC: 4.51 MIL/uL (ref 3.87–5.11)
RDW: 15.1 % (ref 11.5–15.5)
WBC: 12.6 10*3/uL — ABNORMAL HIGH (ref 4.0–10.5)
nRBC: 0 % (ref 0.0–0.2)

## 2022-05-10 LAB — RAPID URINE DRUG SCREEN, HOSP PERFORMED
Amphetamines: NOT DETECTED
Barbiturates: NOT DETECTED
Benzodiazepines: POSITIVE — AB
Cocaine: NOT DETECTED
Opiates: POSITIVE — AB
Tetrahydrocannabinol: NOT DETECTED

## 2022-05-10 LAB — VITAMIN B12: Vitamin B-12: 81 pg/mL — ABNORMAL LOW (ref 180–914)

## 2022-05-10 LAB — LACTIC ACID, PLASMA: Lactic Acid, Venous: 2 mmol/L (ref 0.5–1.9)

## 2022-05-10 LAB — URINALYSIS, COMPLETE (UACMP) WITH MICROSCOPIC
Bilirubin Urine: NEGATIVE
Glucose, UA: NEGATIVE mg/dL
Ketones, ur: 20 mg/dL — AB
Leukocytes,Ua: NEGATIVE
Nitrite: NEGATIVE
Protein, ur: 100 mg/dL — AB
Specific Gravity, Urine: 1.023 (ref 1.005–1.030)
pH: 5 (ref 5.0–8.0)

## 2022-05-10 LAB — COMPREHENSIVE METABOLIC PANEL
ALT: 27 U/L (ref 0–44)
AST: 50 U/L — ABNORMAL HIGH (ref 15–41)
Albumin: 3.3 g/dL — ABNORMAL LOW (ref 3.5–5.0)
Alkaline Phosphatase: 91 U/L (ref 38–126)
Anion gap: 13 (ref 5–15)
BUN: 6 mg/dL (ref 6–20)
CO2: 27 mmol/L (ref 22–32)
Calcium: 8.4 mg/dL — ABNORMAL LOW (ref 8.9–10.3)
Chloride: 97 mmol/L — ABNORMAL LOW (ref 98–111)
Creatinine, Ser: 0.4 mg/dL — ABNORMAL LOW (ref 0.44–1.00)
GFR, Estimated: >60 mL/min (ref >60)
Glucose, Bld: 175 mg/dL — ABNORMAL HIGH (ref 70–99)
Potassium: 2.7 mmol/L — CL (ref 3.5–5.1)
Sodium: 137 mmol/L (ref 135–145)
Total Bilirubin: 2.4 mg/dL — ABNORMAL HIGH (ref 0.3–1.2)
Total Protein: 7.3 g/dL (ref 6.5–8.1)

## 2022-05-10 LAB — MAGNESIUM: Magnesium: 1.7 mg/dL (ref 1.7–2.4)

## 2022-05-10 LAB — FOLATE: Folate: 3.8 ng/mL — ABNORMAL LOW (ref >5.9)

## 2022-05-10 LAB — LIPID PANEL
Cholesterol: 192 mg/dL (ref 0–200)
HDL: 37 mg/dL — ABNORMAL LOW (ref >40)
LDL Cholesterol: 132 mg/dL — ABNORMAL HIGH (ref 0–99)
Total CHOL/HDL Ratio: 5.2 RATIO
Triglycerides: 115 mg/dL (ref <150)
VLDL: 23 mg/dL (ref 0–40)

## 2022-05-10 LAB — PROCALCITONIN: Procalcitonin: <0.1 ng/mL

## 2022-05-10 LAB — PHOSPHORUS: Phosphorus: 2.6 mg/dL (ref 2.5–4.6)

## 2022-05-10 LAB — HIV ANTIBODY (ROUTINE TESTING W REFLEX): HIV Screen 4th Generation wRfx: NONREACTIVE

## 2022-05-10 LAB — APTT: aPTT: 26 seconds (ref 24–36)

## 2022-05-10 MED ORDER — CYANOCOBALAMIN 1000 MCG/ML IJ SOLN
1000.0000 ug | Freq: Once | INTRAMUSCULAR | Status: AC
  Administered 2022-05-10: 1000 ug via INTRAMUSCULAR
  Filled 2022-05-10: qty 1

## 2022-05-10 MED ORDER — LACTATED RINGERS IV BOLUS
1000.0000 mL | Freq: Once | INTRAVENOUS | Status: AC
  Administered 2022-05-10: 1000 mL via INTRAVENOUS

## 2022-05-10 MED ORDER — POTASSIUM CHLORIDE IN NACL 40-0.9 MEQ/L-% IV SOLN: INTRAVENOUS | Status: DC

## 2022-05-10 MED ORDER — POTASSIUM CHLORIDE 10 MEQ/100ML IV SOLN
10.0000 meq | INTRAVENOUS | Status: AC
  Administered 2022-05-10 (×4): 10 meq via INTRAVENOUS
  Filled 2022-05-10 (×4): qty 100

## 2022-05-10 MED ORDER — HYDROMORPHONE HCL 1 MG/ML IJ SOLN
1.0000 mg | INTRAMUSCULAR | Status: DC | PRN
  Administered 2022-05-10 – 2022-05-13 (×23): 1 mg via INTRAVENOUS
  Filled 2022-05-10 (×24): qty 1

## 2022-05-10 MED ORDER — VITAMIN B-12 100 MCG PO TABS
500.0000 ug | ORAL_TABLET | Freq: Every day | ORAL | Status: DC
  Administered 2022-05-11 – 2022-05-13 (×3): 500 ug via ORAL
  Filled 2022-05-10 (×3): qty 5

## 2022-05-10 MED ORDER — ONDANSETRON HCL 4 MG/2ML IJ SOLN: 4.0000 mg | Freq: Four times a day (QID) | INTRAMUSCULAR | Status: DC | PRN

## 2022-05-10 MED ORDER — THIAMINE MONONITRATE 100 MG PO TABS
100.0000 mg | ORAL_TABLET | Freq: Every day | ORAL | Status: DC
  Administered 2022-05-10 – 2022-05-13 (×4): 100 mg via ORAL
  Filled 2022-05-10 (×4): qty 1

## 2022-05-10 MED ORDER — AMLODIPINE BESYLATE 5 MG PO TABS
10.0000 mg | ORAL_TABLET | Freq: Every day | ORAL | Status: DC
  Administered 2022-05-10 – 2022-05-13 (×4): 10 mg via ORAL
  Filled 2022-05-10 (×4): qty 2

## 2022-05-10 MED ORDER — FOLIC ACID 1 MG PO TABS
1.0000 mg | ORAL_TABLET | Freq: Every day | ORAL | Status: DC
  Administered 2022-05-10 – 2022-05-13 (×4): 1 mg via ORAL
  Filled 2022-05-10 (×4): qty 1

## 2022-05-10 MED ORDER — PROCHLORPERAZINE EDISYLATE 10 MG/2ML IJ SOLN
10.0000 mg | Freq: Four times a day (QID) | INTRAMUSCULAR | Status: DC | PRN
  Administered 2022-05-10: 10 mg via INTRAVENOUS
  Filled 2022-05-10: qty 2

## 2022-05-10 MED ORDER — MAGNESIUM SULFATE 2 GM/50ML IV SOLN
2.0000 g | Freq: Once | INTRAVENOUS | Status: AC
  Administered 2022-05-10: 2 g via INTRAVENOUS
  Filled 2022-05-10: qty 50

## 2022-05-10 MED ORDER — PANTOPRAZOLE SODIUM 40 MG IV SOLR
40.0000 mg | INTRAVENOUS | Status: DC
  Administered 2022-05-10 – 2022-05-13 (×4): 40 mg via INTRAVENOUS
  Filled 2022-05-10 (×4): qty 10

## 2022-05-10 MED ORDER — ENOXAPARIN SODIUM 40 MG/0.4ML IJ SOSY
40.0000 mg | PREFILLED_SYRINGE | INTRAMUSCULAR | Status: DC
  Administered 2022-05-10 – 2022-05-13 (×4): 40 mg via SUBCUTANEOUS
  Filled 2022-05-10 (×4): qty 0.4

## 2022-05-10 NOTE — Assessment & Plan Note (Signed)
Add potassium to IV fluids Magnesium 1.7 Replete IV

## 2022-05-10 NOTE — Assessment & Plan Note (Addendum)
Patient drinks at least 3 beers on a daily basis Continue IV fluids Judicious opioids Advance to full liquid diet>>soft diet which pt tolerated Triglycerides 115

## 2022-05-10 NOTE — Assessment & Plan Note (Signed)
Serum V81--84>> replete Folic acid 0.3>> replete

## 2022-05-10 NOTE — Progress Notes (Signed)
   05/10/22 1459  Vitals  Temp 98 F (36.7 C)  BP (!) 160/102  BP Location Right Arm  BP Method Automatic  Patient Position (if appropriate) Lying  Pulse Rate (!) 123  Resp 20  Level of Consciousness  Level of Consciousness Alert  MEWS COLOR  MEWS Score Color Yellow  Oxygen Therapy  SpO2 90 %  O2 Device Nasal Cannula  O2 Flow Rate (L/min) 2 L/min  MEWS Score  MEWS Temp 0  MEWS Systolic 0  MEWS Pulse 2  MEWS RR 0  MEWS LOC 0  MEWS Score 2  Provider Notification  Provider Name/Title Dr. Carles Collet  Date Provider Notified 05/10/22  Time Provider Notified 1506  Method of Notification Page  Notification Reason Other (Comment) (yellow mews with increased HR and BP)  Provider response See new orders  Date of Provider Response 05/10/22

## 2022-05-10 NOTE — Assessment & Plan Note (Signed)
BMI 33.70 Lifestyle modification

## 2022-05-10 NOTE — Assessment & Plan Note (Addendum)
9/27 hemoglobin A1c--5.3

## 2022-05-10 NOTE — Assessment & Plan Note (Addendum)
No signs of withdrawal Started thiamine

## 2022-05-10 NOTE — Assessment & Plan Note (Addendum)
Secondary to volume depletion and vagal phenomena Echo EF 60-65%, difficult acoustic windows, possible inf HK, trivial MR/TR Remain on telemetry--no concerning dysrhythmias -personally reviewed EKG--sinus, nonspecific TWI

## 2022-05-10 NOTE — Assessment & Plan Note (Addendum)
Continue amlodipine Restart PTA spironolactone

## 2022-05-10 NOTE — Assessment & Plan Note (Addendum)
Secondary to acute pancreatitis Advance to full liquids

## 2022-05-10 NOTE — Progress Notes (Signed)
  Transition of Care [facility]) Screening Note   Patient Details  Name: Jillian Shepherd Date of Birth: 12/17/1985   Transition of Care [facility]) CM/SW Contact:    Ihor Gully, LCSW Phone Number: 05/10/2022, 12:49 PM    Transition of Care Department [facility]) has reviewed patient and no TOC needs have been identified at this time. We will continue to monitor patient advancement through interdisciplinary progression rounds. If new patient transition needs arise, please place a TOC consult.

## 2022-05-10 NOTE — Assessment & Plan Note (Addendum)
Replete

## 2022-05-10 NOTE — Hospital Course (Signed)
37 year old female with a history of alcohol dependence, hypertension, and GERD presenting with 2-day history of nausea, vomiting, and upper abdominal pain.  The patient states that she drinks at least three 10% alcohol beers [facility]) on a daily basis.  Last drink was on 05/07/2022.  She states that she also recently started on on amlodipine and spironolactone a little over a week prior to this admission.  She denies any other over-the-counter medications.  She denies any illegal drug use.  The patient went to work on 05/09/2022 when she felt dizzy and had a syncopal episode.  She did not bite her tongue or have any bowel or bladder incontinence.  She states that she has had remittent nausea and vomiting since 05/07/22.  She denies any hematemesis, diarrhea, hematochezia, melena, dysuria, hematuria.  She has had some lower chest pain without any worsening shortness of breath. In the ED, the patient was afebrile and hemodynamically stable with oxygen saturation 93% on 2 L.  BMP showed sodium 137, potassium 2.7, bicarbonate 27, BUN 6, creatinine 0.40.  AST 93, ALT 35, lipase 927.  WBC 12.1, hemoglobin 17.6, platelets 195,000.  CT of the abdomen and pelvis showed large amount of inflammatory stranding around the pancreas without any pseudocyst or ductal dilatation.  EKG showed sinus rhythm with nonspecific T wave changes.  Troponin was 3>> 3.  Chest x-ray showed lingular opacity.  The patient was started on IV fluids and IV hydromorphone.

## 2022-05-10 NOTE — Progress Notes (Signed)
PROGRESS NOTE  Jillian Shepherd WPY:[id number] DOB: 1986/04/27 DOA: 05/09/2022 PCP: [name], MD  Brief History:  36 year old female with a history of alcohol dependence, hypertension, and GERD presenting with 2-day history of nausea, vomiting, and upper abdominal pain.  The patient states that she drinks at least three 10% alcohol beers [facility]) on a daily basis.  Last drink was on 05/07/2022.  She states that she also recently started on on amlodipine and spironolactone a little over a week prior to this admission.  She denies any other over-the-counter medications.  She denies any illegal drug use.  The patient went to work on 05/09/2022 when she felt dizzy and had a syncopal episode.  She did not bite her tongue or have any bowel or bladder incontinence.  She states that she has had remittent nausea and vomiting since 05/07/22.  She denies any hematemesis, diarrhea, hematochezia, melena, dysuria, hematuria.  She has had some lower chest pain without any worsening shortness of breath. In the ED, the patient was afebrile and hemodynamically stable with oxygen saturation 93% on 2 L.  BMP showed sodium 137, potassium 2.7, bicarbonate 27, BUN 6, creatinine 0.40.  AST 93, ALT 35, lipase 927.  WBC 12.1, hemoglobin 17.6, platelets 195,000.  CT of the abdomen and pelvis showed large amount of inflammatory stranding around the pancreas without any pseudocyst or ductal dilatation.  EKG showed sinus rhythm with nonspecific T wave changes.  Troponin was 3>> 3.  Chest x-ray showed lingular opacity.  The patient was started on IV fluids and IV hydromorphone.    Assessment and Plan: Acute alcoholic pancreatitis Patient drinks at least 3 beers on a daily basis Continue IV fluids Judicious opioids Clear liquid diet for now Check lipid panel  Alcohol abuse No signs of withdrawal Start thiamine  Hyperglycemia Check hemoglobin A1c  Elevated MCV Serum K53--97>> replete Folic acid 6.7>>  replete  Hypomagnesemia Replete Check magnesium in the morning  Hypokalemia Add potassium to IV fluids Magnesium 1.7 Replete IV  Syncope Secondary to volume depletion and vagal phenomena Echocardiogram Remain on telemetry  Nausea & vomiting Secondary to acute pancreatitis Clear liquid diet for now  Obesity BMI 33.70 Lifestyle modification  Essential hypertension Continue amlodipine          Family Communication:  no  Family at bedside  Consultants:  none  Code Status:  FULL   DVT Prophylaxis:  [place] Lovenox   Procedures: As Listed in Progress Note Above  Antibiotics: none      Subjective: Patient denies fevers, chills, headache, chest pain, dyspnea, vomiting, diarrhea, dysuria, hematuria, hematochezia, and melena. She complains of upper abd pain, mod to severe, radiating to back. + nausea  Objective: Vitals:   05/09/22 2000 05/09/22 2040 05/10/22 0029 05/10/22 0529  BP: (!) 158/101 (!) 155/100 (!) 168/109 (!) 166/104  Pulse: (!) 104 (!) 106 (!) 110 (!) 109  Resp: '20 20 18 18  '$ Temp:  98.2 F (36.8 C) 97.9 F (36.6 C) 98.1 F (36.7 C)  TempSrc:   Oral   SpO2: 93% 92% 93% 91%  Weight:  86.3 kg    Height:        Intake/Output Summary (Last 24 hours) at 05/10/2022 1147 Last data filed at 05/10/2022 0211 Gross per 24 hour  Intake 0 ml  Output 300 ml  Net -300 ml   Weight change:  Exam:  General:  Pt is alert, follows commands appropriately, not in acute  distress HEENT: No icterus, No thrush, No neck mass, [place]/AT Cardiovascular: RRR, S1/S2, no rubs, no gallops Respiratory: CTA bilaterally, no wheezing, no crackles, no rhonchi Abdomen: Soft/+BS, upper abd tender, non distended, no guarding Extremities: No edema, No lymphangitis, No petechiae, No rashes, no synovitis   Data Reviewed: I have personally reviewed following labs and imaging studies Basic Metabolic Panel: Recent Labs  Lab 05/09/22 1301 05/09/22 1503 05/10/22 0407  NA 140   --  137  K 2.5*  --  2.7*  CL 97*  --  97*  CO2 27  --  27  GLUCOSE 150*  --  175*  BUN 9  --  6  CREATININE 0.51  --  0.40*  CALCIUM 9.2  --  8.4*  MG  --  1.5* 1.7  PHOS  --   --  2.6   Liver Function Tests: Recent Labs  Lab 05/09/22 1503 05/10/22 0407  AST 93* 50*  ALT 35 27  ALKPHOS 102 91  BILITOT 1.4* 2.4*  PROT 8.4* 7.3  ALBUMIN 3.9 3.3*   Recent Labs  Lab 05/09/22 1503  LIPASE 927*   No results for input(s): "AMMONIA" in the last 168 hours. Coagulation Profile: No results for input(s): "INR", "PROTIME" in the last 168 hours. CBC: Recent Labs  Lab 05/09/22 1301 05/10/22 0407  WBC 12.1* 12.6*  HGB 17.6* 17.4*  HCT 49.3* 48.7*  MCV 106.9* 108.0*  PLT 195 196   Cardiac Enzymes: No results for input(s): "CKTOTAL", "CKMB", "CKMBINDEX", "TROPONINI" in the last 168 hours. BNP: Invalid input(s): "POCBNP" CBG: No results for input(s): "GLUCAP" in the last 168 hours. HbA1C: No results for input(s): "HGBA1C" in the last 72 hours. Urine analysis:    Component Value Date/Time   COLORURINE STRAW (A) 08/06/2007 2031   APPEARANCEUR CLOUDY (A) 08/06/2007 2031   LABSPEC 1.025 09/28/2007 0450   PHURINE 6.0 09/28/2007 0450   GLUCOSEU NEGATIVE 09/28/2007 0450   HGBUR LARGE (A) 09/28/2007 0450   BILIRUBINUR NEGATIVE 09/28/2007 0450   KETONESUR >80 (A) 09/28/2007 0450   PROTEINUR NEGATIVE 09/28/2007 0450   UROBILINOGEN 0.2 09/28/2007 0450   NITRITE NEGATIVE 09/28/2007 0450   LEUKOCYTESUR NEGATIVE 09/28/2007 0450   Sepsis Labs: '@LABRCNTIP'$ (procalcitonin:4,lacticidven:4) )No results found for this or any previous visit (from the past 240 hour(s)).   Scheduled Meds:  amLODipine  10 mg Oral Daily   enoxaparin (LOVENOX) injection  40 mg Subcutaneous Q24H   pantoprazole (PROTONIX) IV  40 mg Intravenous Q24H   Continuous Infusions:  0.9 % NaCl with KCl 40 mEq / L     magnesium sulfate bolus IVPB 2 g (05/10/22 1059)    Procedures/Studies: US Abdomen Limited  RUQ (LIVER/GB)  Result Date: 05/10/2022 CLINICAL DATA:  Pancreatitis EXAM: ULTRASOUND ABDOMEN LIMITED RIGHT UPPER QUADRANT COMPARISON:  CT 05/09/2022 FINDINGS: Gallbladder: No gallstones or wall thickening visualized. No sonographic Murphy sign noted by sonographer. Common bile duct: Diameter: 3 mm. Liver: Mildly increased hepatic parenchymal echogenicity. There is an ovoid area within the posterior left hepatic lobe of relatively increased parenchymal echogenicity measuring 3.1 x 2.3 x 3.3 cm, which does demonstrate vascularity on color Doppler. Portal vein is patent on color Doppler imaging with normal direction of blood flow towards the liver. Other: None. IMPRESSION: 1. The liver is mildly increased in echogenicity. This is a nonspecific finding but is most commonly seen with fatty infiltration of the liver. There is an ovoid area of relatively increased parenchymal echogenicity within the posterior left hepatic lobe measuring up to 3.3  cm. This is favored to represent area of more focal fatty infiltration, however a mass is not excluded. Recommend further evaluation with nonemergent MRI of the abdomen with and without contrast. 2. Unremarkable sonographic appearance of the gallbladder. Electronically Signed   By: Davina Poke D.O.   On: 05/10/2022 09:04   CT ABDOMEN PELVIS W CONTRAST  Result Date: 05/09/2022 CLINICAL DATA:  Acute generalized abdominal pain. EXAM: CT ABDOMEN AND PELVIS WITH CONTRAST TECHNIQUE: Multidetector CT imaging of the abdomen and pelvis was performed using the standard protocol following bolus administration of intravenous contrast. RADIATION DOSE REDUCTION: This exam was performed according to the departmental dose-optimization program which includes automated exposure control, adjustment of the mA and/or kV according to patient size and/or use of iterative reconstruction technique. CONTRAST:  139m OMNIPAQUE IOHEXOL 300 MG/ML  SOLN COMPARISON:  August 08, 2020. FINDINGS: Lower  chest: No acute abnormality. Hepatobiliary: No focal liver abnormality is seen. No gallstones, gallbladder wall thickening, or biliary dilatation. Pancreas: Large amount of inflammatory stranding is noted around the pancreas consistent with acute pancreatitis. No definite pseudocyst formation or ductal dilatation is noted. Spleen: Normal in size without focal abnormality. Adrenals/Urinary Tract: Adrenal glands are unremarkable. Kidneys are normal, without renal calculi, focal lesion, or hydronephrosis. Bladder is unremarkable. Stomach/Bowel: Stomach is within normal limits. Appendix appears normal. No evidence of bowel wall thickening, distention, or inflammatory changes. Vascular/Lymphatic: No significant vascular findings are present. No enlarged abdominal or pelvic lymph nodes. Reproductive: Uterus and bilateral adnexa are unremarkable. Other: No abdominal wall hernia or abnormality. No abdominopelvic ascites. Musculoskeletal: No acute or significant osseous findings. IMPRESSION: Findings consistent with acute pancreatitis. No definite pseudocyst formation is noted. Electronically Signed   By: JMarijo ConceptionM.D.   On: 05/09/2022 18:19   DG Chest 2 View  Result Date: 05/09/2022 CLINICAL DATA:  Chest pain EXAM: CHEST - 2 VIEW COMPARISON:  None Available. FINDINGS: The heart size and mediastinal contours are within normal limits. Streaky opacity within the lingula. Lungs are otherwise clear. No pleural effusion or pneumothorax. The visualized skeletal structures are unremarkable. IMPRESSION: Streaky opacity within the lingula, which may represent atelectasis versus developing infiltrate. Electronically Signed   By: NDavina PokeD.O.   On: 05/09/2022 13:30    DOrson Eva DO  [facility]  If 7PM-7AM, please contact night-coverage [email or web address] Password [email or web address] 05/10/2022, 11:47 AM   LOS: 1 day

## 2022-05-11 ENCOUNTER — Inpatient Hospital Stay (HOSPITAL_COMMUNITY): Payer: 59

## 2022-05-11 DIAGNOSIS — R0902 Hypoxemia: Secondary | ICD-10-CM

## 2022-05-11 LAB — COMPREHENSIVE METABOLIC PANEL
ALT: 15 U/L (ref 0–44)
AST: 23 U/L (ref 15–41)
Albumin: 2.6 g/dL — ABNORMAL LOW (ref 3.5–5.0)
Alkaline Phosphatase: 68 U/L (ref 38–126)
Anion gap: 8 (ref 5–15)
BUN: 8 mg/dL (ref 6–20)
CO2: 28 mmol/L (ref 22–32)
Calcium: 7.8 mg/dL — ABNORMAL LOW (ref 8.9–10.3)
Chloride: 97 mmol/L — ABNORMAL LOW (ref 98–111)
Creatinine, Ser: 0.4 mg/dL — ABNORMAL LOW (ref 0.44–1.00)
GFR, Estimated: >60 mL/min (ref >60)
Glucose, Bld: 161 mg/dL — ABNORMAL HIGH (ref 70–99)
Potassium: 3.7 mmol/L (ref 3.5–5.1)
Sodium: 133 mmol/L — ABNORMAL LOW (ref 135–145)
Total Bilirubin: 2.3 mg/dL — ABNORMAL HIGH (ref 0.3–1.2)
Total Protein: 6 g/dL — ABNORMAL LOW (ref 6.5–8.1)

## 2022-05-11 LAB — CBC
HCT: 44.8 % (ref 36.0–46.0)
Hemoglobin: 15.6 g/dL — ABNORMAL HIGH (ref 12.0–15.0)
MCH: 38.6 pg — ABNORMAL HIGH (ref 26.0–34.0)
MCHC: 34.8 g/dL (ref 30.0–36.0)
MCV: 110.9 fL — ABNORMAL HIGH (ref 80.0–100.0)
Platelets: 141 10*3/uL — ABNORMAL LOW (ref 150–400)
RBC: 4.04 MIL/uL (ref 3.87–5.11)
RDW: 15 % (ref 11.5–15.5)
WBC: 11.8 10*3/uL — ABNORMAL HIGH (ref 4.0–10.5)
nRBC: 0 % (ref 0.0–0.2)

## 2022-05-11 LAB — D-DIMER, QUANTITATIVE: D-Dimer, Quant: 2.95 ug/mL-FEU — ABNORMAL HIGH (ref 0.00–0.50)

## 2022-05-11 LAB — LACTIC ACID, PLASMA: Lactic Acid, Venous: 1.7 mmol/L (ref 0.5–1.9)

## 2022-05-11 LAB — PROCALCITONIN: Procalcitonin: 0.16 ng/mL

## 2022-05-11 LAB — MAGNESIUM: Magnesium: 1.7 mg/dL (ref 1.7–2.4)

## 2022-05-11 MED ORDER — POLYETHYLENE GLYCOL 3350 17 G PO PACK
17.0000 g | PACK | Freq: Every day | ORAL | Status: DC
  Administered 2022-05-11 – 2022-05-12 (×2): 17 g via ORAL
  Filled 2022-05-11 (×2): qty 1

## 2022-05-11 MED ORDER — BISACODYL 10 MG RE SUPP
10.0000 mg | Freq: Once | RECTAL | Status: AC
  Administered 2022-05-11: 10 mg via RECTAL
  Filled 2022-05-11: qty 1

## 2022-05-11 MED ORDER — MAGNESIUM SULFATE 2 GM/50ML IV SOLN
2.0000 g | Freq: Once | INTRAVENOUS | Status: AC
  Administered 2022-05-11: 2 g via INTRAVENOUS
  Filled 2022-05-11: qty 50

## 2022-05-11 MED ORDER — IOHEXOL 350 MG/ML SOLN
75.0000 mL | Freq: Once | INTRAVENOUS | Status: AC | PRN
  Administered 2022-05-11: 75 mL via INTRAVENOUS

## 2022-05-11 NOTE — Progress Notes (Addendum)
PROGRESS NOTE  Jillian Shepherd JJH:[id number] DOB: 1986/02/26 DOA: 05/09/2022 PCP: [name], MD  Brief History:  36 year old female with a history of alcohol dependence, hypertension, and GERD presenting with 2-day history of nausea, vomiting, and upper abdominal pain.  The patient states that she drinks at least three 10% alcohol beers [facility]) on a daily basis.  Last drink was on 05/07/2022.  She states that she also recently started on on amlodipine and spironolactone a little over a week prior to this admission.  She denies any other over-the-counter medications.  She denies any illegal drug use.  The patient went to work on 05/09/2022 when she felt dizzy and had a syncopal episode.  She did not bite her tongue or have any bowel or bladder incontinence.  She states that she has had remittent nausea and vomiting since 05/07/22.  She denies any hematemesis, diarrhea, hematochezia, melena, dysuria, hematuria.  She has had some lower chest pain without any worsening shortness of breath. In the ED, the patient was afebrile and hemodynamically stable with oxygen saturation 93% on 2 L.  BMP showed sodium 137, potassium 2.7, bicarbonate 27, BUN 6, creatinine 0.40.  AST 93, ALT 35, lipase 927.  WBC 12.1, hemoglobin 17.6, platelets 195,000.  CT of the abdomen and pelvis showed large amount of inflammatory stranding around the pancreas without any pseudocyst or ductal dilatation.  EKG showed sinus rhythm with nonspecific T wave changes.  Troponin was 3>> 3.  Chest x-ray showed lingular opacity.  The patient was started on IV fluids and IV hydromorphone.    Assessment and Plan: * Acute alcoholic pancreatitis Patient drinks at least 3 beers on a daily basis Continue IV fluids Judicious opioids Advance to full liquid diet for now Triglycerides 115  Alcohol abuse No signs of withdrawal Started thiamine  Hypoxia Saturation 85% on RA>>place on 2L -CXR--bibiasilar atelectasis -check  D-dimer -likely multifactorial due to atelectasis, OHS/OSA, COPD and tobacco abuse  Nausea & vomiting Secondary to acute pancreatitis Advance to full liquids  Hyperglycemia 9/27 hemoglobin A1c--5.3  Elevated MCV Serum Y18--56>> replete Folic acid 3.1>> replete  Hypomagnesemia Replete  Hypokalemia Add potassium to IV fluids Magnesium 1.7 Replete IV  Syncope Secondary to volume depletion and vagal phenomena Echo EF 60-65%, difficult acoustic windows, possible inf HK, trivial MR/TR Remain on telemetry--no concerning dysrhythmias -personally reviewed EKG--sinus, nonspecific TWI  Obesity BMI 33.70 Lifestyle modification  Essential hypertension Continue amlodipine        Family Communication:   no Family at bedside  Consultants:  none  Code Status:  FULL   DVT Prophylaxis:  [place] Lovenox   Procedures: As Listed in Progress Note Above  Antibiotics: None        Subjective: Pt states abd pain is 35-40% better.  Tolerating clears, wants to advance diet.  Denies f/c, cp, sob, vomiting.  Complains of constipation.    Objective: Vitals:   05/10/22 1834 05/10/22 1946 05/11/22 0508 05/11/22 1305  BP: (!) 159/111 (!) 144/96 135/87 (!) 130/91  Pulse: (!) 124 (!) 122 (!) 124 (!) 125  Resp: '20 20 20 16  '$ Temp: 98 F (36.7 C) 98.7 F (37.1 C) 98.1 F (36.7 C) 99.3 F (37.4 C)  TempSrc: Oral Oral  Oral  SpO2: 94% 92% 91% 90%  Weight:      Height:        Intake/Output Summary (Last 24 hours) at 05/11/2022 1519 Last data filed at 05/11/2022 1350 Gross  per 24 hour  Intake 2697.08 ml  Output --  Net 2697.08 ml   Weight change:  Exam:  General:  Pt is alert, follows commands appropriately, not in acute distress HEENT: No icterus, No thrush, No neck mass, [place]/AT Cardiovascular: RRR, S1/S2, no rubs, no gallops Respiratory: bibasilar rales. Abdomen: Soft/+BS, upper abd tender, non distended, no guarding Extremities: No edema, No lymphangitis, No petechiae,  No rashes, no synovitis   Data Reviewed: I have personally reviewed following labs and imaging studies Basic Metabolic Panel: Recent Labs  Lab 05/09/22 1301 05/09/22 1503 05/10/22 0407 05/11/22 0548  NA 140  --  137 133*  K 2.5*  --  2.7* 3.7  CL 97*  --  97* 97*  CO2 27  --  27 28  GLUCOSE 150*  --  175* 161*  BUN 9  --  6 8  CREATININE 0.51  --  0.40* 0.40*  CALCIUM 9.2  --  8.4* 7.8*  MG  --  1.5* 1.7 1.7  PHOS  --   --  2.6  --    Liver Function Tests: Recent Labs  Lab 05/09/22 1503 05/10/22 0407 05/11/22 0548  AST 93* 50* 23  ALT 35 27 15  ALKPHOS 102 91 68  BILITOT 1.4* 2.4* 2.3*  PROT 8.4* 7.3 6.0*  ALBUMIN 3.9 3.3* 2.6*   Recent Labs  Lab 05/09/22 1503  LIPASE 927*   No results for input(s): "AMMONIA" in the last 168 hours. Coagulation Profile: No results for input(s): "INR", "PROTIME" in the last 168 hours. CBC: Recent Labs  Lab 05/09/22 1301 05/10/22 0407 05/11/22 0548  WBC 12.1* 12.6* 11.8*  HGB 17.6* 17.4* 15.6*  HCT 49.3* 48.7* 44.8  MCV 106.9* 108.0* 110.9*  PLT 195 196 141*   Cardiac Enzymes: No results for input(s): "CKTOTAL", "CKMB", "CKMBINDEX", "TROPONINI" in the last 168 hours. BNP: Invalid input(s): "POCBNP" CBG: No results for input(s): "GLUCAP" in the last 168 hours. HbA1C: No results for input(s): "HGBA1C" in the last 72 hours. Urine analysis:    Component Value Date/Time   COLORURINE AMBER (A) 05/10/2022 1048   APPEARANCEUR HAZY (A) 05/10/2022 1048   LABSPEC 1.023 05/10/2022 1048   PHURINE 5.0 05/10/2022 1048   GLUCOSEU NEGATIVE 05/10/2022 1048   HGBUR SMALL (A) 05/10/2022 1048   BILIRUBINUR NEGATIVE 05/10/2022 1048   KETONESUR 20 (A) 05/10/2022 1048   PROTEINUR 100 (A) 05/10/2022 1048   UROBILINOGEN 0.2 09/28/2007 0450   NITRITE NEGATIVE 05/10/2022 1048   LEUKOCYTESUR NEGATIVE 05/10/2022 1048   Sepsis Labs: '@LABRCNTIP'$ (procalcitonin:4,lacticidven:4) ) Recent Results (from the past 240 hour(s))  Culture, blood  (Routine X 2) w Reflex to ID Panel     Status: None (Preliminary result)   Collection Time: 05/10/22  4:22 PM   Specimen: Right Antecubital; Blood  Result Value Ref Range Status   Specimen Description RIGHT ANTECUBITAL  Final   Special Requests   Final    BOTTLES DRAWN AEROBIC AND ANAEROBIC Blood Culture adequate volume   Culture   Final    NO GROWTH < 24 HOURS Performed at [facility], [address], [place], [place] [address]    Report Status PENDING  Incomplete  Culture, blood (Routine X 2) w Reflex to ID Panel     Status: None (Preliminary result)   Collection Time: 05/10/22  4:22 PM   Specimen: Right Antecubital; Blood  Result Value Ref Range Status   Specimen Description RIGHT ANTECUBITAL  Final   Special Requests   Final  BOTTLES DRAWN AEROBIC AND ANAEROBIC Blood Culture adequate volume   Culture   Final    NO GROWTH < 24 HOURS Performed at [facility], [address] [place],  [address]    Report Status PENDING  Incomplete     Scheduled Meds:  amLODipine  10 mg Oral Daily   bisacodyl  10 mg Rectal Once   enoxaparin (LOVENOX) injection  40 mg Subcutaneous Y60Y   folic acid  1 mg Oral Daily   pantoprazole (PROTONIX) IV  40 mg Intravenous Q24H   polyethylene glycol  17 g Oral Daily   thiamine  100 mg Oral Daily   vitamin B-12  500 mcg Oral Daily   Continuous Infusions:  0.9 % NaCl with KCl 40 mEq / L 125 mL/hr at 05/11/22 1410   magnesium sulfate bolus IVPB      Procedures/Studies: ECHOCARDIOGRAM COMPLETE  Result Date: 05/10/2022    ECHOCARDIOGRAM REPORT   Patient Name:   Jillian Shepherd Date of Exam: 05/10/2022 Medical Rec #:  [id number]       Height:       63.0 in Accession #:    [id number]      Weight:       190.3 lb Date of Birth:  [birth year]       BSA:          1.893 m Patient Age:    36 years        BP:           166/104 mmHg Patient Gender: F               HR:           117 bpm. Exam Location:  [facility] Procedure: 2D Echo, Cardiac Doppler and Color  Doppler Indications:    Syncope  History:        Patient has no prior history of Echocardiogram examinations.                 Signs/Symptoms:Syncope; Risk Factors:Hypertension. ETOH abuse.  Sonographer:    Wenda Low Referring Phys: [id number] OLADAPO ADEFESO IMPRESSIONS  1. Difficult acoustic windows Endocardium is difficult to see in apical views. Possible basal inferior hypokinesis. Would recommend limited echo with Definity to confirm wall motion. Left ventricular ejection fraction, by estimation, is 60 to 65%. The left ventricle has normal function. There is mild left ventricular hypertrophy. Indeterminate diastolic filling due to E-A fusion.  2. Right ventricular systolic function is normal. The right ventricular size is normal.  3. The mitral valve is normal in structure. Trivial mitral valve regurgitation.  4. The aortic valve is normal in structure. Aortic valve regurgitation is not visualized.  5. The inferior vena cava is normal in size with greater than 50% respiratory variability, suggesting right atrial pressure of 3 mmHg. FINDINGS  Left Ventricle: Difficult acoustic windows Endocardium is difficult to see in apical views. Possible basal inferior hypokinesis. Would recommend limited echo with Definity to confirm wall motion. Left ventricular ejection fraction, by estimation, is 60 to 65%. The left ventricle has normal function. The left ventricular internal cavity size was normal in size. There is mild left ventricular hypertrophy. Indeterminate diastolic filling due to E-A fusion. Right Ventricle: The right ventricular size is normal. Right vetricular wall thickness was not assessed. Right ventricular systolic function is normal. Left Atrium: Left atrial size was normal in size. Right Atrium: Right atrial size was normal in size. Pericardium: There is no evidence of pericardial effusion. Mitral Valve:  The mitral valve is normal in structure. Trivial mitral valve regurgitation. MV peak gradient, 5.9  mmHg. The mean mitral valve gradient is 2.0 mmHg. Tricuspid Valve: The tricuspid valve is normal in structure. Tricuspid valve regurgitation is trivial. Aortic Valve: The aortic valve is normal in structure. Aortic valve regurgitation is not visualized. Aortic valve mean gradient measures 4.0 mmHg. Aortic valve peak gradient measures 7.7 mmHg. Aortic valve area, by VTI measures 2.85 cm. Pulmonic Valve: The pulmonic valve was normal in structure. Pulmonic valve regurgitation is trivial. Aorta: The aortic root is normal in size and structure. Venous: The inferior vena cava is normal in size with greater than 50% respiratory variability, suggesting right atrial pressure of 3 mmHg. IAS/Shunts: No atrial level shunt detected by color flow Doppler.  LEFT VENTRICLE PLAX 2D LVIDd:         4.80 cm   Diastology LVIDs:         2.90 cm   LV e' medial:    7.40 cm/s LV PW:         1.10 cm   LV E/e' medial:  7.9 LV IVS:        1.20 cm   LV e' lateral:   8.59 cm/s LVOT diam:     2.00 cm   LV E/e' lateral: 6.8 LV SV:         66 LV SV Index:   35 LVOT Area:     3.14 cm  RIGHT VENTRICLE RV Basal diam:  2.80 cm RV Mid diam:    2.50 cm RV S prime:     20.50 cm/s TAPSE (M-mode): 2.7 cm LEFT ATRIUM             Index        RIGHT ATRIUM           Index LA diam:        3.50 cm 1.85 cm/m   RA Area:     13.40 cm LA Vol (A2C):   26.1 ml 13.79 ml/m  RA Volume:   31.40 ml  16.59 ml/m LA Vol (A4C):   30.4 ml 16.06 ml/m LA Biplane Vol: 29.2 ml 15.42 ml/m  AORTIC VALVE                    PULMONIC VALVE AV Area (Vmax):    2.49 cm     PV Vmax:       1.10 m/s AV Area (Vmean):   2.34 cm     PV Peak grad:  4.8 mmHg AV Area (VTI):     2.85 cm AV Vmax:           139.00 cm/s AV Vmean:          95.100 cm/s AV VTI:            0.230 m AV Peak Grad:      7.7 mmHg AV Mean Grad:      4.0 mmHg LVOT Vmax:         110.00 cm/s LVOT Vmean:        70.700 cm/s LVOT VTI:          0.209 m LVOT/AV VTI ratio: 0.91  AORTA Ao Root diam: 3.20 cm MITRAL VALVE MV  Area (PHT): 7.82 cm     SHUNTS MV Area VTI:   3.95 cm     Systemic VTI:  0.21 m MV Peak grad:  5.9 mmHg     Systemic Diam: 2.00 cm MV Mean grad:  2.0 mmHg MV Vmax:       1.21 m/s MV Vmean:      66.3 cm/s MV Decel Time: 97 msec MV E velocity: 58.70 cm/s MV A velocity: 102.00 cm/s MV E/A ratio:  0.58 Dorris Carnes MD Electronically signed by Dorris Carnes MD Signature Date/Time: 05/10/2022/5:02:45 PM    Final    US Carotid Bilateral  Result Date: 05/10/2022 CLINICAL DATA:  Syncopal episode. History of hypertension and smoking. EXAM: BILATERAL CAROTID DUPLEX ULTRASOUND TECHNIQUE: Pearline Cables scale imaging, color Doppler and duplex ultrasound were performed of bilateral carotid and vertebral arteries in the neck. COMPARISON:  None Available. FINDINGS: Criteria: Quantification of carotid stenosis is based on velocity parameters that correlate the residual internal carotid diameter with NASCET-based stenosis levels, using the diameter of the distal internal carotid lumen as the denominator for stenosis measurement. The following velocity measurements were obtained: RIGHT ICA: 103/42 cm/sec CCA: 003/70 cm/sec SYSTOLIC ICA/CCA RATIO:  0.9 ECA: 135 cm/sec LEFT ICA: 90/32 cm/sec CCA: 488/89 cm/sec SYSTOLIC ICA/CCA RATIO:  0.8 ECA: 124 cm/sec RIGHT CAROTID ARTERY: There is no grayscale evidence of significant intimal thickening or atherosclerotic plaque affecting the interrogated portions of the right carotid system. There are no elevated peak systolic velocities within the interrogated course of the right internal carotid artery to suggest a hemodynamically significant stenosis. RIGHT VERTEBRAL ARTERY:  Antegrade flow LEFT CAROTID ARTERY: There is no grayscale evidence of significant intimal thickening or atherosclerotic plaque affecting the interrogated portions of the left carotid system. There are no elevated peak systolic velocities within the interrogated course of the left internal carotid artery to suggest a hemodynamically  significant stenosis. LEFT VERTEBRAL ARTERY:  Antegrade flow IMPRESSION: Unremarkable carotid Doppler ultrasound. Electronically Signed   By: Sandi Mariscal M.D.   On: 05/10/2022 13:23   US Abdomen Limited RUQ (LIVER/GB)  Result Date: 05/10/2022 CLINICAL DATA:  Pancreatitis EXAM: ULTRASOUND ABDOMEN LIMITED RIGHT UPPER QUADRANT COMPARISON:  CT 05/09/2022 FINDINGS: Gallbladder: No gallstones or wall thickening visualized. No sonographic Murphy sign noted by sonographer. Common bile duct: Diameter: 3 mm. Liver: Mildly increased hepatic parenchymal echogenicity. There is an ovoid area within the posterior left hepatic lobe of relatively increased parenchymal echogenicity measuring 3.1 x 2.3 x 3.3 cm, which does demonstrate vascularity on color Doppler. Portal vein is patent on color Doppler imaging with normal direction of blood flow towards the liver. Other: None. IMPRESSION: 1. The liver is mildly increased in echogenicity. This is a nonspecific finding but is most commonly seen with fatty infiltration of the liver. There is an ovoid area of relatively increased parenchymal echogenicity within the posterior left hepatic lobe measuring up to 3.3 cm. This is favored to represent area of more focal fatty infiltration, however a mass is not excluded. Recommend further evaluation with nonemergent MRI of the abdomen with and without contrast. 2. Unremarkable sonographic appearance of the gallbladder. Electronically Signed   By: Davina Poke D.O.   On: 05/10/2022 09:04   CT ABDOMEN PELVIS W CONTRAST  Result Date: 05/09/2022 CLINICAL DATA:  Acute generalized abdominal pain. EXAM: CT ABDOMEN AND PELVIS WITH CONTRAST TECHNIQUE: Multidetector CT imaging of the abdomen and pelvis was performed using the standard protocol following bolus administration of intravenous contrast. RADIATION DOSE REDUCTION: This exam was performed according to the departmental dose-optimization program which includes automated exposure  control, adjustment of the mA and/or kV according to patient size and/or use of iterative reconstruction technique. CONTRAST:  164m OMNIPAQUE IOHEXOL 300 MG/ML  SOLN COMPARISON:  August 08, 2020.  FINDINGS: Lower chest: No acute abnormality. Hepatobiliary: No focal liver abnormality is seen. No gallstones, gallbladder wall thickening, or biliary dilatation. Pancreas: Large amount of inflammatory stranding is noted around the pancreas consistent with acute pancreatitis. No definite pseudocyst formation or ductal dilatation is noted. Spleen: Normal in size without focal abnormality. Adrenals/Urinary Tract: Adrenal glands are unremarkable. Kidneys are normal, without renal calculi, focal lesion, or hydronephrosis. Bladder is unremarkable. Stomach/Bowel: Stomach is within normal limits. Appendix appears normal. No evidence of bowel wall thickening, distention, or inflammatory changes. Vascular/Lymphatic: No significant vascular findings are present. No enlarged abdominal or pelvic lymph nodes. Reproductive: Uterus and bilateral adnexa are unremarkable. Other: No abdominal wall hernia or abnormality. No abdominopelvic ascites. Musculoskeletal: No acute or significant osseous findings. IMPRESSION: Findings consistent with acute pancreatitis. No definite pseudocyst formation is noted. Electronically Signed   By: Marijo Conception M.D.   On: 05/09/2022 18:19   DG Chest 2 View  Result Date: 05/09/2022 CLINICAL DATA:  Chest pain EXAM: CHEST - 2 VIEW COMPARISON:  None Available. FINDINGS: The heart size and mediastinal contours are within normal limits. Streaky opacity within the lingula. Lungs are otherwise clear. No pleural effusion or pneumothorax. The visualized skeletal structures are unremarkable. IMPRESSION: Streaky opacity within the lingula, which may represent atelectasis versus developing infiltrate. Electronically Signed   By: Davina Poke D.O.   On: 05/09/2022 13:30    Orson Eva, DO  [facility]  If 7PM-7AM, please contact night-coverage [email or web address] Password [email or web address] 05/11/2022, 3:19 PM   LOS: 2 days

## 2022-05-11 NOTE — Assessment & Plan Note (Addendum)
Saturation 85% on RA>>place on 2L -CXR--bibiasilar atelectasis -check D-dimer 2.95 -likely multifactorial due to atelectasis, OHS/OSA, COPD and tobacco abuse - CTA chest--moderate left, small right effusion, patchy GGO in bilateral upper lobes R>L -give lasix IV x 1 -started empiric ceftriaxone and doxy

## 2022-05-11 NOTE — TOC Initial Note (Signed)
Transition of Care [facility]) - Initial/Assessment Note    Patient Details  Name: Jillian Shepherd MRN: [id number] Date of Birth: 04-18-1986  [facility]) CM/SW Contact:    Ihor Gully, LCSW Phone Number: 05/11/2022, 4:51 PM  Clinical Narrative:                 Patient in need of home oxygen. No DME provider preference. Referral made to [facility].  Patient advised: Please call [phone] before you discharge so that [facility] can deliver your home oxygen concentrator.   Expected Discharge Plan: Home/Self Care Barriers to Discharge: Continued Medical Work up   Patient Goals and CMS Choice        Expected Discharge Plan and Services Expected Discharge Plan: Home/Self Care                         DME Arranged: Oxygen DME Agency: [facility] Date DME Agency Contacted: 05/11/22 Time DME Agency Contacted: [phone] Representative spoke with at DME Agency: Caryl Pina            Prior Living Arrangements/Services                       Activities of Daily Living Home Assistive Devices/Equipment: None ADL Screening (condition at time of admission) Patient's cognitive ability adequate to safely complete daily activities?: Yes Is the patient deaf or have difficulty hearing?: No Does the patient have difficulty seeing, even when wearing glasses/contacts?: No Does the patient have difficulty concentrating, remembering, or making decisions?: No Patient able to express need for assistance with ADLs?: Yes Does the patient have difficulty dressing or bathing?: No Independently performs ADLs?: Yes (appropriate for developmental age) Does the patient have difficulty walking or climbing stairs?: No Weakness of Legs: None Weakness of Arms/Hands: None  Permission Sought/Granted                  Emotional Assessment              Admission diagnosis:  Acute pancreatitis [K85.90] Hypokalemia [E87.6] Syncope, unspecified syncope type [R55] Alcohol-induced acute  pancreatitis, unspecified complication status [A21.30] Patient Active Problem List   Diagnosis Date Noted   Hypoxia 05/11/2022   Abdominal pain 05/10/2022   Nausea & vomiting 05/10/2022   Syncope 05/10/2022   Hypokalemia 05/10/2022   Hypomagnesemia 05/10/2022   Prolonged QT interval 05/10/2022   Elevated MCV 05/10/2022   Hyperglycemia 05/10/2022   Alcohol abuse 86/57/8469   Acute alcoholic pancreatitis 62/95/2841   Diarrhea    Pancreatitis, acute 07/27/2020   Sacral back pain 09/14/2019   Bilateral ovarian cysts 09/02/2018   Postpartum care following vaginal delivery 01/22/2015   Elevated blood pressure reading without diagnosis of hypertension 01/20/2015   Hypertension affecting pregnancy 01/20/2015   Elevated blood pressure affecting pregnancy in third trimester, antepartum 01/19/2015   Essential hypertension 07/07/2014   Depression 07/07/2014   Obesity 07/07/2014   PCP:  [name], MD Pharmacy:   [id number] [place] [address]Phone: [phone]: [phone]    Social Determinants of Health (SDOH) Interventions    Readmission Risk Interventions     No data to display

## 2022-05-11 NOTE — Progress Notes (Signed)
SATURATION QUALIFICATIONS: (This note is used to comply with regulatory documentation for home oxygen)   Patient Saturations on Room Air at Rest = 85   Patient Saturations on Room Air while Ambulating = n/a   Patient Saturations on 2 Liters of oxygen while Ambulating = 92%   Please briefly explain why patient needs home oxygen: To maintain 02 sat at 90% or above during ambulation.  Orson Eva, DO

## 2022-05-12 DIAGNOSIS — R0902 Hypoxemia: Secondary | ICD-10-CM

## 2022-05-12 LAB — CBC
HCT: 40.9 % (ref 36.0–46.0)
Hemoglobin: 13.7 g/dL (ref 12.0–15.0)
MCH: 38.5 pg — ABNORMAL HIGH (ref 26.0–34.0)
MCHC: 33.5 g/dL (ref 30.0–36.0)
MCV: 114.9 fL — ABNORMAL HIGH (ref 80.0–100.0)
Platelets: 136 10*3/uL — ABNORMAL LOW (ref 150–400)
RBC: 3.56 MIL/uL — ABNORMAL LOW (ref 3.87–5.11)
RDW: 15.2 % (ref 11.5–15.5)
WBC: 6.6 10*3/uL (ref 4.0–10.5)
nRBC: 0.3 % — ABNORMAL HIGH (ref 0.0–0.2)

## 2022-05-12 LAB — COMPREHENSIVE METABOLIC PANEL
ALT: 12 U/L (ref 0–44)
AST: 18 U/L (ref 15–41)
Albumin: 2.4 g/dL — ABNORMAL LOW (ref 3.5–5.0)
Alkaline Phosphatase: 60 U/L (ref 38–126)
Anion gap: 8 (ref 5–15)
BUN: 8 mg/dL (ref 6–20)
CO2: 27 mmol/L (ref 22–32)
Calcium: 7.9 mg/dL — ABNORMAL LOW (ref 8.9–10.3)
Chloride: 98 mmol/L (ref 98–111)
Creatinine, Ser: 0.35 mg/dL — ABNORMAL LOW (ref 0.44–1.00)
GFR, Estimated: >60 mL/min (ref >60)
Glucose, Bld: 122 mg/dL — ABNORMAL HIGH (ref 70–99)
Potassium: 4 mmol/L (ref 3.5–5.1)
Sodium: 133 mmol/L — ABNORMAL LOW (ref 135–145)
Total Bilirubin: 2.2 mg/dL — ABNORMAL HIGH (ref 0.3–1.2)
Total Protein: 6 g/dL — ABNORMAL LOW (ref 6.5–8.1)

## 2022-05-12 LAB — MAGNESIUM: Magnesium: 2.1 mg/dL (ref 1.7–2.4)

## 2022-05-12 LAB — PROCALCITONIN: Procalcitonin: 0.28 ng/mL

## 2022-05-12 LAB — MRSA NEXT GEN BY PCR, NASAL: MRSA by PCR Next Gen: NOT DETECTED

## 2022-05-12 LAB — BRAIN NATRIURETIC PEPTIDE: B Natriuretic Peptide: 42 pg/mL (ref 0.0–100.0)

## 2022-05-12 MED ORDER — DOXYCYCLINE HYCLATE 100 MG PO TABS
100.0000 mg | ORAL_TABLET | Freq: Two times a day (BID) | ORAL | Status: DC
  Administered 2022-05-12 – 2022-05-13 (×3): 100 mg via ORAL
  Filled 2022-05-12 (×3): qty 1

## 2022-05-12 MED ORDER — SODIUM CHLORIDE 0.9 % IV SOLN
1.0000 g | INTRAVENOUS | Status: DC
  Administered 2022-05-12 – 2022-05-13 (×2): 1 g via INTRAVENOUS
  Filled 2022-05-12 (×2): qty 10

## 2022-05-12 MED ORDER — FUROSEMIDE 10 MG/ML IJ SOLN
40.0000 mg | Freq: Once | INTRAMUSCULAR | Status: AC
  Administered 2022-05-12: 40 mg via INTRAVENOUS
  Filled 2022-05-12: qty 4

## 2022-05-12 NOTE — Plan of Care (Signed)
  Problem: Health Behavior/Discharge Planning: Goal: Ability to manage health-related needs will improve Outcome: Progressing   Problem: Clinical Measurements: Goal: Ability to maintain clinical measurements within normal limits will improve Outcome: Progressing   Problem: Clinical Measurements: Goal: Diagnostic test results will improve Outcome: Progressing   Problem: Clinical Measurements: Goal: Respiratory complications will improve Outcome: Progressing   Problem: Clinical Measurements: Goal: Cardiovascular complication will be avoided Outcome: Progressing   Problem: Coping: Goal: Level of anxiety will decrease Outcome: Progressing

## 2022-05-12 NOTE — Progress Notes (Signed)
Patient tolerated full liquid diet with no complaints of nausea. Patient had pain to abdomen throughout shift, see MAR.

## 2022-05-12 NOTE — Progress Notes (Signed)
PROGRESS NOTE  Jillian Shepherd PYP:[id number] DOB: 1986/05/27 DOA: 05/09/2022 PCP: [name], MD  Brief History:  36 year old female with a history of alcohol dependence, hypertension, and GERD presenting with 2-day history of nausea, vomiting, and upper abdominal pain.  The patient states that she drinks at least three 10% alcohol beers [facility]) on a daily basis.  Last drink was on 05/07/2022.  She states that she also recently started on on amlodipine and spironolactone a little over a week prior to this admission.  She denies any other over-the-counter medications.  She denies any illegal drug use.  The patient went to work on 05/09/2022 when she felt dizzy and had a syncopal episode.  She did not bite her tongue or have any bowel or bladder incontinence.  She states that she has had remittent nausea and vomiting since 05/07/22.  She denies any hematemesis, diarrhea, hematochezia, melena, dysuria, hematuria.  She has had some lower chest pain without any worsening shortness of breath. In the ED, the patient was afebrile and hemodynamically stable with oxygen saturation 93% on 2 L.  BMP showed sodium 137, potassium 2.7, bicarbonate 27, BUN 6, creatinine 0.40.  AST 93, ALT 35, lipase 927.  WBC 12.1, hemoglobin 17.6, platelets 195,000.  CT of the abdomen and pelvis showed large amount of inflammatory stranding around the pancreas without any pseudocyst or ductal dilatation.  EKG showed sinus rhythm with nonspecific T wave changes.  Troponin was 3>> 3.  Chest x-ray showed lingular opacity.  The patient was started on IV fluids and IV hydromorphone.    Assessment and Plan: * Acute alcoholic pancreatitis Patient drinks at least 3 beers on a daily basis Continue IV fluids Judicious opioids Advance to full liquid diet for now Triglycerides 115  Alcohol abuse No signs of withdrawal Started thiamine  Hypoxia Saturation 85% on RA>>place on 2L -CXR--bibiasilar atelectasis -check  D-dimer -likely multifactorial due to atelectasis, OHS/OSA, COPD and tobacco abuse  Nausea & vomiting Secondary to acute pancreatitis Advance to full liquids  Hyperglycemia 9/27 hemoglobin A1c--5.3  Elevated MCV Serum I45--80>> replete Folic acid 9.9>> replete  Hypomagnesemia Replete  Hypokalemia Add potassium to IV fluids Magnesium 1.7 Replete IV  Syncope Secondary to volume depletion and vagal phenomena Echo EF 60-65%, difficult acoustic windows, possible inf HK, trivial MR/TR Remain on telemetry--no concerning dysrhythmias -personally reviewed EKG--sinus, nonspecific TWI  Obesity BMI 33.70 Lifestyle modification  Essential hypertension Continue amlodipine   Family Communication:   no Family at bedside   Consultants:  none   Code Status:  FULL    DVT Prophylaxis:  Aldan Lovenox     Procedures: As Listed in Progress Note Above   Antibiotics: None     Subjective: Patient denies fevers, chills, headache, chest pain, dyspnea, nausea, vomiting, diarrhea, abdominal pain, dysuria, hematuria, hematochezia, and melena.   Objective: Vitals:   05/12/22 0522 05/12/22 1002 05/12/22 1435 05/12/22 1442  BP: 117/85 128/88 123/76   Pulse: (!) 112  (!) 103 99  Resp: 17  18   Temp: 98.4 F (36.9 C)  98.3 F (36.8 C)   TempSrc:   Oral   SpO2: 93%  (!) 88% 93%  Weight:      Height:        Intake/Output Summary (Last 24 hours) at 05/12/2022 1723 Last data filed at 05/12/2022 0648 Gross per 24 hour  Intake 1924.88 ml  Output --  Net 1924.88 ml   Weight change:  Exam:  General:  Pt is alert, follows commands appropriately, not in acute distress HEENT: No icterus, No thrush, No neck mass, [place]/AT Cardiovascular: RRR, S1/S2, no rubs, no gallops Respiratory: bibasilar crackle no wheeze Abdomen: Soft/+BS, non tender, non distended, no guarding Extremities: Non pitting edema, No lymphangitis, No petechiae, No rashes, no synovitis   Data Reviewed: I have  personally reviewed following labs and imaging studies Basic Metabolic Panel: Recent Labs  Lab 05/09/22 1301 05/09/22 1503 05/10/22 0407 05/11/22 0548 05/12/22 0500  NA 140  --  137 133* 133*  K 2.5*  --  2.7* 3.7 4.0  CL 97*  --  97* 97* 98  CO2 27  --  '27 28 27  '$ GLUCOSE 150*  --  175* 161* 122*  BUN 9  --  '6 8 8  '$ CREATININE 0.51  --  0.40* 0.40* 0.35*  CALCIUM 9.2  --  8.4* 7.8* 7.9*  MG  --  1.5* 1.7 1.7 2.1  PHOS  --   --  2.6  --   --    Liver Function Tests: Recent Labs  Lab 05/09/22 1503 05/10/22 0407 05/11/22 0548 05/12/22 0500  AST 93* 50* 23 18  ALT 35 '27 15 12  '$ ALKPHOS 102 91 68 60  BILITOT 1.4* 2.4* 2.3* 2.2*  PROT 8.4* 7.3 6.0* 6.0*  ALBUMIN 3.9 3.3* 2.6* 2.4*   Recent Labs  Lab 05/09/22 1503  LIPASE 927*   No results for input(s): "AMMONIA" in the last 168 hours. Coagulation Profile: No results for input(s): "INR", "PROTIME" in the last 168 hours. CBC: Recent Labs  Lab 05/09/22 1301 05/10/22 0407 05/11/22 0548 05/12/22 0500  WBC 12.1* 12.6* 11.8* 6.6  HGB 17.6* 17.4* 15.6* 13.7  HCT 49.3* 48.7* 44.8 40.9  MCV 106.9* 108.0* 110.9* 114.9*  PLT 195 196 141* 136*   Cardiac Enzymes: No results for input(s): "CKTOTAL", "CKMB", "CKMBINDEX", "TROPONINI" in the last 168 hours. BNP: Invalid input(s): "POCBNP" CBG: No results for input(s): "GLUCAP" in the last 168 hours. HbA1C: No results for input(s): "HGBA1C" in the last 72 hours. Urine analysis:    Component Value Date/Time   COLORURINE AMBER (A) 05/10/2022 1048   APPEARANCEUR HAZY (A) 05/10/2022 1048   LABSPEC 1.023 05/10/2022 1048   PHURINE 5.0 05/10/2022 1048   GLUCOSEU NEGATIVE 05/10/2022 1048   HGBUR SMALL (A) 05/10/2022 1048   BILIRUBINUR NEGATIVE 05/10/2022 1048   KETONESUR 20 (A) 05/10/2022 1048   PROTEINUR 100 (A) 05/10/2022 1048   UROBILINOGEN 0.2 09/28/2007 0450   NITRITE NEGATIVE 05/10/2022 1048   LEUKOCYTESUR NEGATIVE 05/10/2022 1048   Sepsis  Labs: '@LABRCNTIP'$ (procalcitonin:4,lacticidven:4) ) Recent Results (from the past 240 hour(s))  Culture, blood (Routine X 2) w Reflex to ID Panel     Status: None (Preliminary result)   Collection Time: 05/10/22  4:22 PM   Specimen: Right Antecubital; Blood  Result Value Ref Range Status   Specimen Description RIGHT ANTECUBITAL  Final   Special Requests   Final    BOTTLES DRAWN AEROBIC AND ANAEROBIC Blood Culture adequate volume   Culture   Final    NO GROWTH 2 DAYS Performed at [facility], [address], [place], [place] [address]    Report Status PENDING  Incomplete  Culture, blood (Routine X 2) w Reflex to ID Panel     Status: None (Preliminary result)   Collection Time: 05/10/22  4:22 PM   Specimen: Right Antecubital; Blood  Result Value Ref Range Status   Specimen Description RIGHT ANTECUBITAL  Final   Special Requests   Final    BOTTLES DRAWN AEROBIC AND ANAEROBIC Blood Culture adequate volume   Culture   Final    NO GROWTH 2 DAYS Performed at [facility], [address], [place], [place] [address]    Report Status PENDING  Incomplete  MRSA Next Gen by PCR, Nasal     Status: None   Collection Time: 05/12/22 10:40 AM   Specimen: Nasal Mucosa; Nasal Swab  Result Value Ref Range Status   MRSA by PCR Next Gen NOT DETECTED NOT DETECTED Final    Comment: (NOTE) The GeneXpert MRSA Assay (FDA approved for NASAL specimens only), is one component of a comprehensive MRSA colonization surveillance program. It is not intended to diagnose MRSA infection nor to guide or monitor treatment for MRSA infections. Test performance is not FDA approved in patients less than 54 years old. Performed at [facility], [address], [place], [place] [address]      Scheduled Meds:  amLODipine  10 mg Oral Daily   doxycycline  100 mg Oral Q12H   enoxaparin (LOVENOX) injection  40 mg Subcutaneous E42P   folic acid  1 mg Oral Daily   pantoprazole (PROTONIX) IV  40 mg Intravenous Q24H    polyethylene glycol  17 g Oral Daily   thiamine  100 mg Oral Daily   vitamin B-12  500 mcg Oral Daily   Continuous Infusions:  0.9 % NaCl with KCl 40 mEq / L 50 mL/hr at 05/12/22 0849   cefTRIAXone (ROCEPHIN)  IV 1 g (05/12/22 1018)    Procedures/Studies: CT Angio Chest Pulmonary Embolism (PE) W or WO Contrast  Result Date: 05/11/2022 CLINICAL DATA:  Positive D-dimer, hypoxia.  Shortness of breath. EXAM: CT ANGIOGRAPHY CHEST WITH CONTRAST TECHNIQUE: Multidetector CT imaging of the chest was performed using the standard protocol during bolus administration of intravenous contrast. Multiplanar CT image reconstructions and MIPs were obtained to evaluate the vascular anatomy. RADIATION DOSE REDUCTION: This exam was performed according to the departmental dose-optimization program which includes automated exposure control, adjustment of the mA and/or kV according to patient size and/or use of iterative reconstruction technique. CONTRAST:  41m OMNIPAQUE IOHEXOL 350 MG/ML SOLN COMPARISON:  None Available. FINDINGS: Cardiovascular: Satisfactory opacification of the pulmonary arteries to the segmental level. No evidence of pulmonary embolism. Normal heart size. No pericardial effusion. Mediastinum/Nodes: No enlarged mediastinal, hilar, or axillary lymph nodes. Thyroid gland, trachea, and esophagus demonstrate no significant findings. Lungs/Pleura: Moderate left and small right pleural effusions are present. There are patchy multifocal ground-glass opacities in the upper lobes, right greater than left. There is compressive atelectasis in the lingula and left lower lobe. No pneumothorax. Trachea and central airways are patent. Upper Abdomen: There is trace free fluid in the right upper quadrant. Musculoskeletal: No chest wall abnormality. No acute or significant osseous findings. Review of the MIP images confirms the above findings. IMPRESSION: 1. No evidence for pulmonary embolism. 2. Moderate left and small  right pleural effusions. 3. Patchy multifocal ground-glass opacities in the upper lobes, right greater than left. Findings may be infectious/inflammatory or related to pulmonary edema. 4. Trace free fluid in the right upper quadrant of the abdomen. Electronically Signed   By: ARonney AstersM.D.   On: 05/11/2022 21:12   ECHOCARDIOGRAM COMPLETE  Result Date: 05/10/2022    ECHOCARDIOGRAM REPORT   Patient Name:   Jillian RAHMINGDate of Exam: 05/10/2022 Medical Rec #:  [id number]      Height:  63.0 in Accession #:    [id number]      Weight:       190.3 lb Date of Birth:  Jan 01, 1986       BSA:          1.893 m Patient Age:    36 years        BP:           166/104 mmHg Patient Gender: F               HR:           117 bpm. Exam Location:  [facility] Procedure: 2D Echo, Cardiac Doppler and Color Doppler Indications:    Syncope  History:        Patient has no prior history of Echocardiogram examinations.                 Signs/Symptoms:Syncope; Risk Factors:Hypertension. ETOH abuse.  Sonographer:    Wenda Low Referring Phys: [id number] OLADAPO ADEFESO IMPRESSIONS  1. Difficult acoustic windows Endocardium is difficult to see in apical views. Possible basal inferior hypokinesis. Would recommend limited echo with Definity to confirm wall motion. Left ventricular ejection fraction, by estimation, is 60 to 65%. The left ventricle has normal function. There is mild left ventricular hypertrophy. Indeterminate diastolic filling due to E-A fusion.  2. Right ventricular systolic function is normal. The right ventricular size is normal.  3. The mitral valve is normal in structure. Trivial mitral valve regurgitation.  4. The aortic valve is normal in structure. Aortic valve regurgitation is not visualized.  5. The inferior vena cava is normal in size with greater than 50% respiratory variability, suggesting right atrial pressure of 3 mmHg. FINDINGS  Left Ventricle: Difficult acoustic windows Endocardium is difficult to see  in apical views. Possible basal inferior hypokinesis. Would recommend limited echo with Definity to confirm wall motion. Left ventricular ejection fraction, by estimation, is 60 to 65%. The left ventricle has normal function. The left ventricular internal cavity size was normal in size. There is mild left ventricular hypertrophy. Indeterminate diastolic filling due to E-A fusion. Right Ventricle: The right ventricular size is normal. Right vetricular wall thickness was not assessed. Right ventricular systolic function is normal. Left Atrium: Left atrial size was normal in size. Right Atrium: Right atrial size was normal in size. Pericardium: There is no evidence of pericardial effusion. Mitral Valve: The mitral valve is normal in structure. Trivial mitral valve regurgitation. MV peak gradient, 5.9 mmHg. The mean mitral valve gradient is 2.0 mmHg. Tricuspid Valve: The tricuspid valve is normal in structure. Tricuspid valve regurgitation is trivial. Aortic Valve: The aortic valve is normal in structure. Aortic valve regurgitation is not visualized. Aortic valve mean gradient measures 4.0 mmHg. Aortic valve peak gradient measures 7.7 mmHg. Aortic valve area, by VTI measures 2.85 cm. Pulmonic Valve: The pulmonic valve was normal in structure. Pulmonic valve regurgitation is trivial. Aorta: The aortic root is normal in size and structure. Venous: The inferior vena cava is normal in size with greater than 50% respiratory variability, suggesting right atrial pressure of 3 mmHg. IAS/Shunts: No atrial level shunt detected by color flow Doppler.  LEFT VENTRICLE PLAX 2D LVIDd:         4.80 cm   Diastology LVIDs:         2.90 cm   LV e' medial:    7.40 cm/s LV PW:         1.10 cm   LV E/e' medial:  7.9  LV IVS:        1.20 cm   LV e' lateral:   8.59 cm/s LVOT diam:     2.00 cm   LV E/e' lateral: 6.8 LV SV:         66 LV SV Index:   35 LVOT Area:     3.14 cm  RIGHT VENTRICLE RV Basal diam:  2.80 cm RV Mid diam:    2.50 cm RV S  prime:     20.50 cm/s TAPSE (M-mode): 2.7 cm LEFT ATRIUM             Index        RIGHT ATRIUM           Index LA diam:        3.50 cm 1.85 cm/m   RA Area:     13.40 cm LA Vol (A2C):   26.1 ml 13.79 ml/m  RA Volume:   31.40 ml  16.59 ml/m LA Vol (A4C):   30.4 ml 16.06 ml/m LA Biplane Vol: 29.2 ml 15.42 ml/m  AORTIC VALVE                    PULMONIC VALVE AV Area (Vmax):    2.49 cm     PV Vmax:       1.10 m/s AV Area (Vmean):   2.34 cm     PV Peak grad:  4.8 mmHg AV Area (VTI):     2.85 cm AV Vmax:           139.00 cm/s AV Vmean:          95.100 cm/s AV VTI:            0.230 m AV Peak Grad:      7.7 mmHg AV Mean Grad:      4.0 mmHg LVOT Vmax:         110.00 cm/s LVOT Vmean:        70.700 cm/s LVOT VTI:          0.209 m LVOT/AV VTI ratio: 0.91  AORTA Ao Root diam: 3.20 cm MITRAL VALVE MV Area (PHT): 7.82 cm     SHUNTS MV Area VTI:   3.95 cm     Systemic VTI:  0.21 m MV Peak grad:  5.9 mmHg     Systemic Diam: 2.00 cm MV Mean grad:  2.0 mmHg MV Vmax:       1.21 m/s MV Vmean:      66.3 cm/s MV Decel Time: 97 msec MV E velocity: 58.70 cm/s MV A velocity: 102.00 cm/s MV E/A ratio:  0.58 Dorris Carnes MD Electronically signed by Dorris Carnes MD Signature Date/Time: 05/10/2022/5:02:45 PM    Final    US Carotid Bilateral  Result Date: 05/10/2022 CLINICAL DATA:  Syncopal episode. History of hypertension and smoking. EXAM: BILATERAL CAROTID DUPLEX ULTRASOUND TECHNIQUE: Pearline Cables scale imaging, color Doppler and duplex ultrasound were performed of bilateral carotid and vertebral arteries in the neck. COMPARISON:  None Available. FINDINGS: Criteria: Quantification of carotid stenosis is based on velocity parameters that correlate the residual internal carotid diameter with NASCET-based stenosis levels, using the diameter of the distal internal carotid lumen as the denominator for stenosis measurement. The following velocity measurements were obtained: RIGHT ICA: 103/42 cm/sec CCA: 629/52 cm/sec SYSTOLIC ICA/CCA RATIO:  0.9  ECA: 135 cm/sec LEFT ICA: 90/32 cm/sec CCA: 841/32 cm/sec SYSTOLIC ICA/CCA RATIO:  0.8 ECA: 124 cm/sec RIGHT CAROTID ARTERY: There is no grayscale evidence of  significant intimal thickening or atherosclerotic plaque affecting the interrogated portions of the right carotid system. There are no elevated peak systolic velocities within the interrogated course of the right internal carotid artery to suggest a hemodynamically significant stenosis. RIGHT VERTEBRAL ARTERY:  Antegrade flow LEFT CAROTID ARTERY: There is no grayscale evidence of significant intimal thickening or atherosclerotic plaque affecting the interrogated portions of the left carotid system. There are no elevated peak systolic velocities within the interrogated course of the left internal carotid artery to suggest a hemodynamically significant stenosis. LEFT VERTEBRAL ARTERY:  Antegrade flow IMPRESSION: Unremarkable carotid Doppler ultrasound. Electronically Signed   By: Sandi Mariscal M.D.   On: 05/10/2022 13:23   US Abdomen Limited RUQ (LIVER/GB)  Result Date: 05/10/2022 CLINICAL DATA:  Pancreatitis EXAM: ULTRASOUND ABDOMEN LIMITED RIGHT UPPER QUADRANT COMPARISON:  CT 05/09/2022 FINDINGS: Gallbladder: No gallstones or wall thickening visualized. No sonographic Murphy sign noted by sonographer. Common bile duct: Diameter: 3 mm. Liver: Mildly increased hepatic parenchymal echogenicity. There is an ovoid area within the posterior left hepatic lobe of relatively increased parenchymal echogenicity measuring 3.1 x 2.3 x 3.3 cm, which does demonstrate vascularity on color Doppler. Portal vein is patent on color Doppler imaging with normal direction of blood flow towards the liver. Other: None. IMPRESSION: 1. The liver is mildly increased in echogenicity. This is a nonspecific finding but is most commonly seen with fatty infiltration of the liver. There is an ovoid area of relatively increased parenchymal echogenicity within the posterior left hepatic lobe  measuring up to 3.3 cm. This is favored to represent area of more focal fatty infiltration, however a mass is not excluded. Recommend further evaluation with nonemergent MRI of the abdomen with and without contrast. 2. Unremarkable sonographic appearance of the gallbladder. Electronically Signed   By: Davina Poke D.O.   On: 05/10/2022 09:04   CT ABDOMEN PELVIS W CONTRAST  Result Date: 05/09/2022 CLINICAL DATA:  Acute generalized abdominal pain. EXAM: CT ABDOMEN AND PELVIS WITH CONTRAST TECHNIQUE: Multidetector CT imaging of the abdomen and pelvis was performed using the standard protocol following bolus administration of intravenous contrast. RADIATION DOSE REDUCTION: This exam was performed according to the departmental dose-optimization program which includes automated exposure control, adjustment of the mA and/or kV according to patient size and/or use of iterative reconstruction technique. CONTRAST:  113m OMNIPAQUE IOHEXOL 300 MG/ML  SOLN COMPARISON:  August 08, 2020. FINDINGS: Lower chest: No acute abnormality. Hepatobiliary: No focal liver abnormality is seen. No gallstones, gallbladder wall thickening, or biliary dilatation. Pancreas: Large amount of inflammatory stranding is noted around the pancreas consistent with acute pancreatitis. No definite pseudocyst formation or ductal dilatation is noted. Spleen: Normal in size without focal abnormality. Adrenals/Urinary Tract: Adrenal glands are unremarkable. Kidneys are normal, without renal calculi, focal lesion, or hydronephrosis. Bladder is unremarkable. Stomach/Bowel: Stomach is within normal limits. Appendix appears normal. No evidence of bowel wall thickening, distention, or inflammatory changes. Vascular/Lymphatic: No significant vascular findings are present. No enlarged abdominal or pelvic lymph nodes. Reproductive: Uterus and bilateral adnexa are unremarkable. Other: No abdominal wall hernia or abnormality. No abdominopelvic ascites.  Musculoskeletal: No acute or significant osseous findings. IMPRESSION: Findings consistent with acute pancreatitis. No definite pseudocyst formation is noted. Electronically Signed   By: JMarijo ConceptionM.D.   On: 05/09/2022 18:19   DG Chest 2 View  Result Date: 05/09/2022 CLINICAL DATA:  Chest pain EXAM: CHEST - 2 VIEW COMPARISON:  None Available. FINDINGS: The heart size and mediastinal contours are within normal  limits. Streaky opacity within the lingula. Lungs are otherwise clear. No pleural effusion or pneumothorax. The visualized skeletal structures are unremarkable. IMPRESSION: Streaky opacity within the lingula, which may represent atelectasis versus developing infiltrate. Electronically Signed   By: Davina Poke D.O.   On: 05/09/2022 13:30    Orson Eva, DO  [facility]  If 7PM-7AM, please contact night-coverage [email or web address] Password [email or web address] 05/12/2022, 5:23 PM   LOS: 3 days

## 2022-05-13 LAB — BASIC METABOLIC PANEL
Anion gap: 8 (ref 5–15)
BUN: 7 mg/dL (ref 6–20)
CO2: 30 mmol/L (ref 22–32)
Calcium: 8.1 mg/dL — ABNORMAL LOW (ref 8.9–10.3)
Chloride: 94 mmol/L — ABNORMAL LOW (ref 98–111)
Creatinine, Ser: 0.34 mg/dL — ABNORMAL LOW (ref 0.44–1.00)
GFR, Estimated: >60 mL/min (ref >60)
Glucose, Bld: 120 mg/dL — ABNORMAL HIGH (ref 70–99)
Potassium: 3.1 mmol/L — ABNORMAL LOW (ref 3.5–5.1)
Sodium: 132 mmol/L — ABNORMAL LOW (ref 135–145)

## 2022-05-13 LAB — CBC
HCT: 38.7 % (ref 36.0–46.0)
Hemoglobin: 13.5 g/dL (ref 12.0–15.0)
MCH: 39 pg — ABNORMAL HIGH (ref 26.0–34.0)
MCHC: 34.9 g/dL (ref 30.0–36.0)
MCV: 111.8 fL — ABNORMAL HIGH (ref 80.0–100.0)
Platelets: 152 10*3/uL (ref 150–400)
RBC: 3.46 MIL/uL — ABNORMAL LOW (ref 3.87–5.11)
RDW: 14.6 % (ref 11.5–15.5)
WBC: 7.1 10*3/uL (ref 4.0–10.5)
nRBC: 0.6 % — ABNORMAL HIGH (ref 0.0–0.2)

## 2022-05-13 LAB — MAGNESIUM: Magnesium: 1.8 mg/dL (ref 1.7–2.4)

## 2022-05-13 MED ORDER — OXYCODONE HCL 5 MG PO CAPS: 5.0000 mg | ORAL_CAPSULE | ORAL | 0 refills | Status: DC | PRN

## 2022-05-13 MED ORDER — POTASSIUM CHLORIDE CRYS ER 20 MEQ PO TBCR
40.0000 meq | EXTENDED_RELEASE_TABLET | Freq: Once | ORAL | Status: AC
  Administered 2022-05-13: 40 meq via ORAL
  Filled 2022-05-13: qty 2

## 2022-05-13 MED ORDER — TORSEMIDE 20 MG PO TABS
40.0000 mg | ORAL_TABLET | Freq: Once | ORAL | Status: AC
  Administered 2022-05-13: 40 mg via ORAL
  Filled 2022-05-13: qty 2

## 2022-05-13 MED ORDER — CEFDINIR 300 MG PO CAPS: 300.0000 mg | ORAL_CAPSULE | Freq: Two times a day (BID) | ORAL | 0 refills | Status: DC

## 2022-05-13 MED ORDER — DOXYCYCLINE HYCLATE 100 MG PO TABS: 100.0000 mg | ORAL_TABLET | Freq: Two times a day (BID) | ORAL | 0 refills | Status: DC

## 2022-05-13 MED ORDER — FOLIC ACID 1 MG PO TABS: 1.0000 mg | ORAL_TABLET | Freq: Every day | ORAL | Status: DC

## 2022-05-13 MED ORDER — CYANOCOBALAMIN 500 MCG PO TABS: 500.0000 ug | ORAL_TABLET | Freq: Every day | ORAL | Status: DC

## 2022-05-13 MED ORDER — SPIRONOLACTONE 25 MG PO TABS: 25.0000 mg | ORAL_TABLET | Freq: Every day | ORAL | 3 refills | Status: DC

## 2022-05-13 NOTE — Discharge Summary (Addendum)
Physician Discharge Summary   Patient: Jillian Shepherd MRN: [id number] DOB: 09/24/1985  Admit date:     05/09/2022  Discharge date: 05/13/22  Discharge Physician: Shanon Brow Starlena Beil   PCP: [name], MD   Recommendations at discharge:   Please follow up with primary care provider within 1-2 weeks  Please repeat BMP and CBC in one week     Hospital Course: 36 year old female with a history of alcohol dependence, hypertension, and GERD presenting with 2-day history of nausea, vomiting, and upper abdominal pain.  The patient states that she drinks at least three 10% alcohol beers [facility]) on a daily basis.  Last drink was on 05/07/2022.  She states that she also recently started on on amlodipine and spironolactone a little over a week prior to this admission.  She denies any other over-the-counter medications.  She denies any illegal drug use.  The patient went to work on 05/09/2022 when she felt dizzy and had a syncopal episode.  She did not bite her tongue or have any bowel or bladder incontinence.  She states that she has had remittent nausea and vomiting since 05/07/22.  She denies any hematemesis, diarrhea, hematochezia, melena, dysuria, hematuria.  She has had some lower chest pain without any worsening shortness of breath. In the ED, the patient was afebrile and hemodynamically stable with oxygen saturation 93% on 2 L.  BMP showed sodium 137, potassium 2.7, bicarbonate 27, BUN 6, creatinine 0.40.  AST 93, ALT 35, lipase 927.  WBC 12.1, hemoglobin 17.6, platelets 195,000.  CT of the abdomen and pelvis showed large amount of inflammatory stranding around the pancreas without any pseudocyst or ductal dilatation.  EKG showed sinus rhythm with nonspecific T wave changes.  Troponin was 3>> 3.  Chest x-ray showed lingular opacity.  The patient was started on IV fluids and IV hydromorphone.  Assessment and Plan: * Acute alcoholic pancreatitis Patient drinks at least 3 beers on a daily  basis Continue IV fluids Judicious opioids Advance to full liquid diet>>soft diet which pt tolerated Triglycerides 115  Alcohol abuse No signs of withdrawal Started thiamine  Hypoxia Saturation 77% on RA with amubulation>>d/c home with on 2L [place] -CXR--bibiasilar atelectasis -check D-dimer 2.95 -likely multifactorial due to pleural effusions, OHS/OSA, COPD and tobacco abuse - CTA chest--moderate left, small right effusion, patchy GGO in bilateral upper lobes R>L -give lasix IV x 1, and torsemide 40 mg x 1 -started empiric ceftriaxone and doxy>>home with doxy/cefdinir x 4 more days -BNP 42 -PCT 0.28  Nausea & vomiting Secondary to acute pancreatitis Advance to full liquids  Hyperglycemia 9/27 hemoglobin A1c--5.3  Elevated MCV Serum Y63--78>> replete Folic acid 5.8>> replete  Hypomagnesemia Replete  Hypokalemia Add potassium to IV fluids Magnesium 1.7 Replete IV  Syncope Secondary to volume depletion and vagal phenomena Echo EF 60-65%, difficult acoustic windows, possible inf HK, trivial MR/TR Remain on telemetry--no concerning dysrhythmias -personally reviewed EKG--sinus, nonspecific TWI  Obesity BMI 33.70 Lifestyle modification  Essential hypertension Continue amlodipine Restart PTA spironolactone         Consultants: none Procedures performed: none  Disposition: Home Diet recommendation:  Soft diet DISCHARGE MEDICATION: Allergies as of 05/13/2022   No Known Allergies      Medication List     STOP taking these medications    pantoprazole 40 MG tablet Commonly known as: PROTONIX       TAKE these medications    amLODipine 10 MG tablet Commonly known as: NORVASC Take 1 tablet (10 mg total) by  mouth daily.   cefdinir 300 MG capsule Commonly known as: OMNICEF Take 1 capsule (300 mg total) by mouth 2 (two) times daily.   cyanocobalamin 500 MCG tablet Commonly known as: VITAMIN B12 Take 1 tablet (500 mcg total) by mouth daily. Start  taking on: May 14, 2022   doxycycline 100 MG tablet Commonly known as: VIBRA-TABS Take 1 tablet (100 mg total) by mouth every 12 (twelve) hours.   folic acid 1 MG tablet Commonly known as: FOLVITE Take 1 tablet (1 mg total) by mouth daily. Start taking on: May 14, 2022   oxycodone 5 MG capsule Commonly known as: OXY-IR Take 1 capsule (5 mg total) by mouth every 4 (four) hours as needed.   potassium chloride SA 20 MEQ tablet Commonly known as: KLOR-CON M Take 2 tablets (40 mEq total) by mouth daily.   spironolactone 25 MG tablet Commonly known as: ALDACTONE Take 1 tablet (25 mg total) by mouth daily. What changed: when to take this               Durable Medical Equipment  (From admission, onward)           Start     Ordered   05/11/22 1515  For home use only DME oxygen  Once       Question Answer Comment  Length of Need 6 Months   Mode or (Route) Nasal cannula   Liters per Minute 2   Oxygen conserving device Yes   Oxygen delivery system Gas      05/11/22 1514            Follow-up [place] Follow up.   Why: Your oxygen is being provided by [facility].  Please call [phone] before you discharge so that [facility] can deliver your home oxygen concentrator. Contact information: [place] [address] [phone]                Discharge Exam: Filed Weights   05/09/22 1239 05/09/22 2040  Weight: 85.3 kg 86.3 kg   HEENT:  [place]/AT, No thrush, no icterus CV:  RRR, no rub, no S3, no S4 Lung:  bibasilar crackles. No wheeze Abd:  soft/+BS, NT Ext:  No edema, no lymphangitis, no synovitis, no rash   Condition at discharge: stable  The results of significant diagnostics from this hospitalization (including imaging, microbiology, ancillary and laboratory) are listed below for reference.   Imaging Studies: CT Angio Chest Pulmonary Embolism (PE) W or WO Contrast  Result Date: 05/11/2022 CLINICAL  DATA:  Positive D-dimer, hypoxia.  Shortness of breath. EXAM: CT ANGIOGRAPHY CHEST WITH CONTRAST TECHNIQUE: Multidetector CT imaging of the chest was performed using the standard protocol during bolus administration of intravenous contrast. Multiplanar CT image reconstructions and MIPs were obtained to evaluate the vascular anatomy. RADIATION DOSE REDUCTION: This exam was performed according to the departmental dose-optimization program which includes automated exposure control, adjustment of the mA and/or kV according to patient size and/or use of iterative reconstruction technique. CONTRAST:  9m OMNIPAQUE IOHEXOL 350 MG/ML SOLN COMPARISON:  None Available. FINDINGS: Cardiovascular: Satisfactory opacification of the pulmonary arteries to the segmental level. No evidence of pulmonary embolism. Normal heart size. No pericardial effusion. Mediastinum/Nodes: No enlarged mediastinal, hilar, or axillary lymph nodes. Thyroid gland, trachea, and esophagus demonstrate no significant findings. Lungs/Pleura: Moderate left and small right pleural effusions are present. There are patchy multifocal ground-glass opacities in the upper lobes, right greater than left.  There is compressive atelectasis in the lingula and left lower lobe. No pneumothorax. Trachea and central airways are patent. Upper Abdomen: There is trace free fluid in the right upper quadrant. Musculoskeletal: No chest wall abnormality. No acute or significant osseous findings. Review of the MIP images confirms the above findings. IMPRESSION: 1. No evidence for pulmonary embolism. 2. Moderate left and small right pleural effusions. 3. Patchy multifocal ground-glass opacities in the upper lobes, right greater than left. Findings may be infectious/inflammatory or related to pulmonary edema. 4. Trace free fluid in the right upper quadrant of the abdomen. Electronically Signed   By: Ronney Asters M.D.   On: 05/11/2022 21:12   ECHOCARDIOGRAM COMPLETE  Result Date:  05/10/2022    ECHOCARDIOGRAM REPORT   Patient Name:   Jillian Shepherd Date of Exam: 05/10/2022 Medical Rec #:  [id number]       Height:       63.0 in Accession #:    [id number]      Weight:       190.3 lb Date of Birth:  [birth year]       BSA:          1.893 m Patient Age:    24 years        BP:           166/104 mmHg Patient Gender: F               HR:           117 bpm. Exam Location:  [facility] Procedure: 2D Echo, Cardiac Doppler and Color Doppler Indications:    Syncope  History:        Patient has no prior history of Echocardiogram examinations.                 Signs/Symptoms:Syncope; Risk Factors:Hypertension. ETOH abuse.  Sonographer:    Wenda Low Referring Phys: [id number] OLADAPO ADEFESO IMPRESSIONS  1. Difficult acoustic windows Endocardium is difficult to see in apical views. Possible basal inferior hypokinesis. Would recommend limited echo with Definity to confirm wall motion. Left ventricular ejection fraction, by estimation, is 60 to 65%. The left ventricle has normal function. There is mild left ventricular hypertrophy. Indeterminate diastolic filling due to E-A fusion.  2. Right ventricular systolic function is normal. The right ventricular size is normal.  3. The mitral valve is normal in structure. Trivial mitral valve regurgitation.  4. The aortic valve is normal in structure. Aortic valve regurgitation is not visualized.  5. The inferior vena cava is normal in size with greater than 50% respiratory variability, suggesting right atrial pressure of 3 mmHg. FINDINGS  Left Ventricle: Difficult acoustic windows Endocardium is difficult to see in apical views. Possible basal inferior hypokinesis. Would recommend limited echo with Definity to confirm wall motion. Left ventricular ejection fraction, by estimation, is 60 to 65%. The left ventricle has normal function. The left ventricular internal cavity size was normal in size. There is mild left ventricular hypertrophy. Indeterminate diastolic filling  due to E-A fusion. Right Ventricle: The right ventricular size is normal. Right vetricular wall thickness was not assessed. Right ventricular systolic function is normal. Left Atrium: Left atrial size was normal in size. Right Atrium: Right atrial size was normal in size. Pericardium: There is no evidence of pericardial effusion. Mitral Valve: The mitral valve is normal in structure. Trivial mitral valve regurgitation. MV peak gradient, 5.9 mmHg. The mean mitral valve gradient is 2.0 mmHg. Tricuspid Valve: The tricuspid valve is normal in  structure. Tricuspid valve regurgitation is trivial. Aortic Valve: The aortic valve is normal in structure. Aortic valve regurgitation is not visualized. Aortic valve mean gradient measures 4.0 mmHg. Aortic valve peak gradient measures 7.7 mmHg. Aortic valve area, by VTI measures 2.85 cm. Pulmonic Valve: The pulmonic valve was normal in structure. Pulmonic valve regurgitation is trivial. Aorta: The aortic root is normal in size and structure. Venous: The inferior vena cava is normal in size with greater than 50% respiratory variability, suggesting right atrial pressure of 3 mmHg. IAS/Shunts: No atrial level shunt detected by color flow Doppler.  LEFT VENTRICLE PLAX 2D LVIDd:         4.80 cm   Diastology LVIDs:         2.90 cm   LV e' medial:    7.40 cm/s LV PW:         1.10 cm   LV E/e' medial:  7.9 LV IVS:        1.20 cm   LV e' lateral:   8.59 cm/s LVOT diam:     2.00 cm   LV E/e' lateral: 6.8 LV SV:         66 LV SV Index:   35 LVOT Area:     3.14 cm  RIGHT VENTRICLE RV Basal diam:  2.80 cm RV Mid diam:    2.50 cm RV S prime:     20.50 cm/s TAPSE (M-mode): 2.7 cm LEFT ATRIUM             Index        RIGHT ATRIUM           Index LA diam:        3.50 cm 1.85 cm/m   RA Area:     13.40 cm LA Vol (A2C):   26.1 ml 13.79 ml/m  RA Volume:   31.40 ml  16.59 ml/m LA Vol (A4C):   30.4 ml 16.06 ml/m LA Biplane Vol: 29.2 ml 15.42 ml/m  AORTIC VALVE                    PULMONIC VALVE  AV Area (Vmax):    2.49 cm     PV Vmax:       1.10 m/s AV Area (Vmean):   2.34 cm     PV Peak grad:  4.8 mmHg AV Area (VTI):     2.85 cm AV Vmax:           139.00 cm/s AV Vmean:          95.100 cm/s AV VTI:            0.230 m AV Peak Grad:      7.7 mmHg AV Mean Grad:      4.0 mmHg LVOT Vmax:         110.00 cm/s LVOT Vmean:        70.700 cm/s LVOT VTI:          0.209 m LVOT/AV VTI ratio: 0.91  AORTA Ao Root diam: 3.20 cm MITRAL VALVE MV Area (PHT): 7.82 cm     SHUNTS MV Area VTI:   3.95 cm     Systemic VTI:  0.21 m MV Peak grad:  5.9 mmHg     Systemic Diam: 2.00 cm MV Mean grad:  2.0 mmHg MV Vmax:       1.21 m/s MV Vmean:      66.3 cm/s MV Decel Time: 97 msec MV E velocity: 58.70 cm/s MV  A velocity: 102.00 cm/s MV E/A ratio:  0.58 Dorris Carnes MD Electronically signed by Dorris Carnes MD Signature Date/Time: 05/10/2022/5:02:45 PM    Final    US Carotid Bilateral  Result Date: 05/10/2022 CLINICAL DATA:  Syncopal episode. History of hypertension and smoking. EXAM: BILATERAL CAROTID DUPLEX ULTRASOUND TECHNIQUE: Pearline Cables scale imaging, color Doppler and duplex ultrasound were performed of bilateral carotid and vertebral arteries in the neck. COMPARISON:  None Available. FINDINGS: Criteria: Quantification of carotid stenosis is based on velocity parameters that correlate the residual internal carotid diameter with NASCET-based stenosis levels, using the diameter of the distal internal carotid lumen as the denominator for stenosis measurement. The following velocity measurements were obtained: RIGHT ICA: 103/42 cm/sec CCA: 892/11 cm/sec SYSTOLIC ICA/CCA RATIO:  0.9 ECA: 135 cm/sec LEFT ICA: 90/32 cm/sec CCA: 941/74 cm/sec SYSTOLIC ICA/CCA RATIO:  0.8 ECA: 124 cm/sec RIGHT CAROTID ARTERY: There is no grayscale evidence of significant intimal thickening or atherosclerotic plaque affecting the interrogated portions of the right carotid system. There are no elevated peak systolic velocities within the interrogated course of the  right internal carotid artery to suggest a hemodynamically significant stenosis. RIGHT VERTEBRAL ARTERY:  Antegrade flow LEFT CAROTID ARTERY: There is no grayscale evidence of significant intimal thickening or atherosclerotic plaque affecting the interrogated portions of the left carotid system. There are no elevated peak systolic velocities within the interrogated course of the left internal carotid artery to suggest a hemodynamically significant stenosis. LEFT VERTEBRAL ARTERY:  Antegrade flow IMPRESSION: Unremarkable carotid Doppler ultrasound. Electronically Signed   By: Sandi Mariscal M.D.   On: 05/10/2022 13:23   US Abdomen Limited RUQ (LIVER/GB)  Result Date: 05/10/2022 CLINICAL DATA:  Pancreatitis EXAM: ULTRASOUND ABDOMEN LIMITED RIGHT UPPER QUADRANT COMPARISON:  CT 05/09/2022 FINDINGS: Gallbladder: No gallstones or wall thickening visualized. No sonographic Murphy sign noted by sonographer. Common bile duct: Diameter: 3 mm. Liver: Mildly increased hepatic parenchymal echogenicity. There is an ovoid area within the posterior left hepatic lobe of relatively increased parenchymal echogenicity measuring 3.1 x 2.3 x 3.3 cm, which does demonstrate vascularity on color Doppler. Portal vein is patent on color Doppler imaging with normal direction of blood flow towards the liver. Other: None. IMPRESSION: 1. The liver is mildly increased in echogenicity. This is a nonspecific finding but is most commonly seen with fatty infiltration of the liver. There is an ovoid area of relatively increased parenchymal echogenicity within the posterior left hepatic lobe measuring up to 3.3 cm. This is favored to represent area of more focal fatty infiltration, however a mass is not excluded. Recommend further evaluation with nonemergent MRI of the abdomen with and without contrast. 2. Unremarkable sonographic appearance of the gallbladder. Electronically Signed   By: Davina Poke D.O.   On: 05/10/2022 09:04   CT ABDOMEN  PELVIS W CONTRAST  Result Date: 05/09/2022 CLINICAL DATA:  Acute generalized abdominal pain. EXAM: CT ABDOMEN AND PELVIS WITH CONTRAST TECHNIQUE: Multidetector CT imaging of the abdomen and pelvis was performed using the standard protocol following bolus administration of intravenous contrast. RADIATION DOSE REDUCTION: This exam was performed according to the departmental dose-optimization program which includes automated exposure control, adjustment of the mA and/or kV according to patient size and/or use of iterative reconstruction technique. CONTRAST:  115m OMNIPAQUE IOHEXOL 300 MG/ML  SOLN COMPARISON:  August 08, 2020. FINDINGS: Lower chest: No acute abnormality. Hepatobiliary: No focal liver abnormality is seen. No gallstones, gallbladder wall thickening, or biliary dilatation. Pancreas: Large amount of inflammatory stranding is noted around the pancreas  consistent with acute pancreatitis. No definite pseudocyst formation or ductal dilatation is noted. Spleen: Normal in size without focal abnormality. Adrenals/Urinary Tract: Adrenal glands are unremarkable. Kidneys are normal, without renal calculi, focal lesion, or hydronephrosis. Bladder is unremarkable. Stomach/Bowel: Stomach is within normal limits. Appendix appears normal. No evidence of bowel wall thickening, distention, or inflammatory changes. Vascular/Lymphatic: No significant vascular findings are present. No enlarged abdominal or pelvic lymph nodes. Reproductive: Uterus and bilateral adnexa are unremarkable. Other: No abdominal wall hernia or abnormality. No abdominopelvic ascites. Musculoskeletal: No acute or significant osseous findings. IMPRESSION: Findings consistent with acute pancreatitis. No definite pseudocyst formation is noted. Electronically Signed   By: Marijo Conception M.D.   On: 05/09/2022 18:19   DG Chest 2 View  Result Date: 05/09/2022 CLINICAL DATA:  Chest pain EXAM: CHEST - 2 VIEW COMPARISON:  None Available. FINDINGS: The  heart size and mediastinal contours are within normal limits. Streaky opacity within the lingula. Lungs are otherwise clear. No pleural effusion or pneumothorax. The visualized skeletal structures are unremarkable. IMPRESSION: Streaky opacity within the lingula, which may represent atelectasis versus developing infiltrate. Electronically Signed   By: Davina Poke D.O.   On: 05/09/2022 13:30    Microbiology: Results for orders placed or performed during the hospital encounter of 05/09/22  Culture, blood (Routine X 2) w Reflex to ID Panel     Status: None (Preliminary result)   Collection Time: 05/10/22  4:22 PM   Specimen: Right Antecubital; Blood  Result Value Ref Range Status   Specimen Description RIGHT ANTECUBITAL  Final   Special Requests   Final    BOTTLES DRAWN AEROBIC AND ANAEROBIC Blood Culture adequate volume   Culture   Final    NO GROWTH 3 DAYS Performed at [facility], [address] [place], [place] [address]    Report Status PENDING  Incomplete  Culture, blood (Routine X 2) w Reflex to ID Panel     Status: None (Preliminary result)   Collection Time: 05/10/22  4:22 PM   Specimen: Right Antecubital; Blood  Result Value Ref Range Status   Specimen Description RIGHT ANTECUBITAL  Final   Special Requests   Final    BOTTLES DRAWN AEROBIC AND ANAEROBIC Blood Culture adequate volume   Culture   Final    NO GROWTH 3 DAYS Performed at [facility], [address], [place], [place] [address]    Report Status PENDING  Incomplete  MRSA Next Gen by PCR, Nasal     Status: None   Collection Time: 05/12/22 10:40 AM   Specimen: Nasal Mucosa; Nasal Swab  Result Value Ref Range Status   MRSA by PCR Next Gen NOT DETECTED NOT DETECTED Final    Comment: (NOTE) The GeneXpert MRSA Assay (FDA approved for NASAL specimens only), is one component of a comprehensive MRSA colonization surveillance program. It is not intended to diagnose MRSA infection nor to guide or monitor treatment for  MRSA infections. Test performance is not FDA approved in patients less than 35 years old. Performed at [facility], [address] [place], [place] [address]     Labs: CBC: Recent Labs  Lab 05/09/22 1301 05/10/22 0407 05/11/22 0548 05/12/22 0500 05/13/22 0533  WBC 12.1* 12.6* 11.8* 6.6 7.1  HGB 17.6* 17.4* 15.6* 13.7 13.5  HCT 49.3* 48.7* 44.8 40.9 38.7  MCV 106.9* 108.0* 110.9* 114.9* 111.8*  PLT 195 196 141* 136* 287   Basic Metabolic Panel: Recent Labs  Lab 05/09/22 1301 05/09/22 1503 05/10/22 0407  05/11/22 0548 05/12/22 0500 05/13/22 0533  NA 140  --  137 133* 133* 132*  K 2.5*  --  2.7* 3.7 4.0 3.1*  CL 97*  --  97* 97* 98 94*  CO2 27  --  '27 28 27 30  '$ GLUCOSE 150*  --  175* 161* 122* 120*  BUN 9  --  '6 8 8 7  '$ CREATININE 0.51  --  0.40* 0.40* 0.35* 0.34*  CALCIUM 9.2  --  8.4* 7.8* 7.9* 8.1*  MG  --  1.5* 1.7 1.7 2.1 1.8  PHOS  --   --  2.6  --   --   --    Liver Function Tests: Recent Labs  Lab 05/09/22 1503 05/10/22 0407 05/11/22 0548 05/12/22 0500  AST 93* 50* 23 18  ALT 35 '27 15 12  '$ ALKPHOS 102 91 68 60  BILITOT 1.4* 2.4* 2.3* 2.2*  PROT 8.4* 7.3 6.0* 6.0*  ALBUMIN 3.9 3.3* 2.6* 2.4*   CBG: No results for input(s): "GLUCAP" in the last 168 hours.  Discharge time spent: greater than 30 minutes.  Signed: Orson Eva, MD [facility] 05/13/2022

## 2022-05-13 NOTE — Progress Notes (Signed)
SATURATION QUALIFICATIONS: (This note is used to comply with regulatory documentation for home oxygen)   Patient Saturations on Room Air at Rest = 90   Patient Saturations on Room Air while Ambulating = 77   Patient Saturations on 2 Liters of oxygen while Ambulating = 95   Please briefly explain why patient needs home oxygen: To maintain 02 sat at 90% or above during ambulation.  Orson Eva, DO

## 2022-05-13 NOTE — Plan of Care (Signed)
  Problem: Clinical Measurements: Goal: Will remain free from infection Outcome: Progressing   Problem: Clinical Measurements: Goal: Diagnostic test results will improve Outcome: Progressing   Problem: Clinical Measurements: Goal: Respiratory complications will improve Outcome: Progressing   Problem: Clinical Measurements: Goal: Cardiovascular complication will be avoided Outcome: Progressing

## 2022-05-13 NOTE — TOC Transition Note (Signed)
Transition of Care [facility]) - CM/SW Discharge Note   Patient Details  Name: Jillian Shepherd MRN: [id number] Date of Birth: 11/27/1985  [facility] CM/SW Contact:  Boneta Lucks, RN Phone Number: 05/13/2022, 11:07 AM   Patient discharging home today. Has [facility] tank for discharge. Needs home set up, CM called Ashly, he is calling Marylyn Ishihara on call driver to do home set up.  Kyle's number also given to patient to call once she is home [phone].  They have home set up on the truck waiting for her to get home.  MD RN updated.    Final next level of care: Home/Self Care Barriers to Discharge: Barriers Resolved   Patient Goals and CMS Choice Patient states their goals for this hospitalization and ongoing recovery are:: to go home CMS Medicare.gov Compare Post Acute Care list provided to:: Patient     Discharge Plan and Services                DME Arranged: Oxygen DME Agency: [employer] Date DME Agency Contacted: 05/11/22 Time DME Agency Contacted: [id number] Representative spoke with at DME Agency: Caryl Pina

## 2022-05-15 ENCOUNTER — Telehealth: Payer: Self-pay

## 2022-05-15 LAB — CULTURE, BLOOD (ROUTINE X 2)
Culture: NO GROWTH
Culture: NO GROWTH
Special Requests: ADEQUATE
Special Requests: ADEQUATE

## 2022-05-15 NOTE — Telephone Encounter (Signed)
Discharged from [facility] on 05/13/22, dx with acut alcoholic pancreatitis. Sent home with Hydrocodone 5 mg but pt states it is not helping her pain. Pt asks if something stronger can be sent in? Pt has hospital f/u appointment on 05/17/22.

## 2022-05-17 ENCOUNTER — Encounter: Payer: Self-pay | Admitting: Family Medicine

## 2022-05-17 ENCOUNTER — Ambulatory Visit (INDEPENDENT_AMBULATORY_CARE_PROVIDER_SITE_OTHER): Payer: 59 | Admitting: Family Medicine

## 2022-05-17 VITALS — BP 124/82 | HR 105 | Temp 97.8°F | Wt 184.4 lb

## 2022-05-17 DIAGNOSIS — E876 Hypokalemia: Secondary | ICD-10-CM | POA: Diagnosis not present

## 2022-05-17 DIAGNOSIS — I151 Hypertension secondary to other renal disorders: Secondary | ICD-10-CM | POA: Diagnosis not present

## 2022-05-17 DIAGNOSIS — M533 Sacrococcygeal disorders, not elsewhere classified: Secondary | ICD-10-CM | POA: Diagnosis not present

## 2022-05-17 DIAGNOSIS — K852 Alcohol induced acute pancreatitis without necrosis or infection: Secondary | ICD-10-CM

## 2022-05-17 DIAGNOSIS — E538 Deficiency of other specified B group vitamins: Secondary | ICD-10-CM

## 2022-05-17 DIAGNOSIS — I10 Essential (primary) hypertension: Secondary | ICD-10-CM

## 2022-05-17 MED ORDER — CYANOCOBALAMIN 1000 MCG/ML IJ SOLN: 1000.0000 ug | INTRAMUSCULAR | 11 refills | Status: DC

## 2022-05-17 MED ORDER — FOLIC ACID 1 MG PO TABS: 1.0000 mg | ORAL_TABLET | Freq: Every day | ORAL | 11 refills | Status: AC

## 2022-05-17 MED ORDER — OXYCODONE HCL 5 MG PO CAPS: 5.0000 mg | ORAL_CAPSULE | ORAL | 0 refills | Status: DC | PRN

## 2022-05-17 NOTE — Progress Notes (Signed)
Subjective:    Patient ID: Jillian Shepherd, female    DOB: [birth year], 36 y.o.   MRN: [id number]  HPI  Admit date:     05/09/2022  Discharge date: 05/13/22  Discharge Physician: Shanon Brow Tat    PCP: [name], MD    Recommendations at discharge:   Please follow up with primary care provider within 1-2 weeks  Please repeat BMP and CBC in one week         Hospital Course: 36 year old female with a history of alcohol dependence, hypertension, and GERD presenting with 2-day history of nausea, vomiting, and upper abdominal pain.  The patient states that she drinks at least three 10% alcohol beers [facility]) on a daily basis.  Last drink was on 05/07/2022.  She states that she also recently started on on amlodipine and spironolactone a little over a week prior to this admission.  She denies any other over-the-counter medications.  She denies any illegal drug use.  The patient went to work on 05/09/2022 when she felt dizzy and had a syncopal episode.  She did not bite her tongue or have any bowel or bladder incontinence.  She states that she has had remittent nausea and vomiting since 05/07/22.  She denies any hematemesis, diarrhea, hematochezia, melena, dysuria, hematuria.  She has had some lower chest pain without any worsening shortness of breath. In the ED, the patient was afebrile and hemodynamically stable with oxygen saturation 93% on 2 L.  BMP showed sodium 137, potassium 2.7, bicarbonate 27, BUN 6, creatinine 0.40.  AST 93, ALT 35, lipase 927.  WBC 12.1, hemoglobin 17.6, platelets 195,000.  CT of the abdomen and pelvis showed large amount of inflammatory stranding around the pancreas without any pseudocyst or ductal dilatation.  EKG showed sinus rhythm with nonspecific T wave changes.  Troponin was 3>> 3.  Chest x-ray showed lingular opacity.  The patient was started on IV fluids and IV hydromorphone.   Assessment and Plan: * Acute alcoholic pancreatitis Patient drinks at least 3  beers on a daily basis Continue IV fluids Judicious opioids Advance to full liquid diet>>soft diet which pt tolerated Triglycerides 115   Alcohol abuse No signs of withdrawal Started thiamine   Hypoxia Saturation 77% on RA with amubulation>>d/c home with on 2L [place] -CXR--bibiasilar atelectasis -check D-dimer 2.95 -likely multifactorial due to pleural effusions, OHS/OSA, COPD and tobacco abuse - CTA chest--moderate left, small right effusion, patchy GGO in bilateral upper lobes R>L -give lasix IV x 1, and torsemide 40 mg x 1 -started empiric ceftriaxone and doxy>>home with doxy/cefdinir x 4 more days -BNP 42 -PCT 0.28   Nausea & vomiting Secondary to acute pancreatitis Advance to full liquids   Hyperglycemia 9/27 hemoglobin A1c--5.3   Elevated MCV Serum H63--14>> replete Folic acid 9.7>> replete   Hypomagnesemia Replete   Hypokalemia Add potassium to IV fluids Magnesium 1.7 Replete IV   Syncope Secondary to volume depletion and vagal phenomena Echo EF 60-65%, difficult acoustic windows, possible inf HK, trivial MR/TR Remain on telemetry--no concerning dysrhythmias -personally reviewed EKG--sinus, nonspecific TWI   Obesity BMI 33.70 Lifestyle modification   Essential hypertension Continue amlodipine Restart PTA spironolactone  05/17/22 Patient's oxygen level was 95% today on room air without oxygen.  She states that her breathing is much better.  She denies any cough or chest pain.  She continues to have epigastric pain radiating into her back due to the pancreatitis.  The pain is worse with food.  She denies any further  syncope or lightheadedness.  She has been taking spironolactone along with her potassium and her amlodipine.  Her blood pressure today is acceptable.  She is out of oxycodone and she is requesting a refill on lab.  She is abstaining from all alcohol.  I reviewed with the patient in detail over 30 minutes for CT scan showing her the inflammation  around her pancreas as well as a left-sided pleural effusion and explaining everything that occurred in the hospital Past Medical History:  Diagnosis Date   Abdominal pain    Cancer ([place])    squmaous cell skin cancer    Collagen vascular disease ([place])    Obesity (BMI 35.0-39.9 without comorbidity)    Past Surgical History:  Procedure Laterality Date   ADENOIDECTOMY     COLONOSCOPY WITH PROPOFOL N/A 10/06/2020   Procedure: COLONOSCOPY WITH PROPOFOL;  Surgeon: Lin Landsman, MD;  Location: [facility] ENDOSCOPY;  Service: Gastroenterology;  Laterality: N/A;  COVID POSITIVE 08/23/2020   ESOPHAGOGASTRODUODENOSCOPY (EGD) WITH PROPOFOL N/A 10/06/2020   Procedure: ESOPHAGOGASTRODUODENOSCOPY (EGD) WITH PROPOFOL;  Surgeon: Lin Landsman, MD;  Location: [place];  Service: Gastroenterology;  Laterality: N/A;   TONSILLECTOMY     Current Outpatient Medications on File Prior to Visit  Medication Sig Dispense Refill   amLODipine (NORVASC) 10 MG tablet Take 1 tablet (10 mg total) by mouth daily. 90 tablet 3   cefdinir (OMNICEF) 300 MG capsule Take 1 capsule (300 mg total) by mouth 2 (two) times daily. 8 capsule 0   doxycycline (VIBRA-TABS) 100 MG tablet Take 1 tablet (100 mg total) by mouth every 12 (twelve) hours. 8 tablet 0   folic acid (FOLVITE) 1 MG tablet Take 1 tablet (1 mg total) by mouth daily.     oxycodone (OXY-IR) 5 MG capsule Take 1 capsule (5 mg total) by mouth every 4 (four) hours as needed. 20 capsule 0   potassium chloride SA (KLOR-CON M) 20 MEQ tablet Take 2 tablets (40 mEq total) by mouth daily. 30 tablet 3   spironolactone (ALDACTONE) 25 MG tablet Take 1 tablet (25 mg total) by mouth daily. 90 tablet 3   vitamin B-12 (VITAMIN B12) 500 MCG tablet Take 1 tablet (500 mcg total) by mouth daily.     Current Facility-Administered Medications on File Prior to Visit  Medication Dose Route Frequency Provider Last Rate Last Admin   meclizine (ANTIVERT) tablet 25 mg  25 mg Oral Q8H  Howard, Imagene Riches, FNP       No Known Allergies Social History   Socioeconomic History   Marital status: Married    Spouse name: Not on file   Number of children: Not on file   Years of education: Not on file   Highest education level: Not on file  Occupational History   Not on file  Tobacco Use   Smoking status: Every Day    Packs/day: 0.25    Years: 9.00    Total pack years: 2.25    Types: Cigarettes   Smokeless tobacco: Never  Vaping Use   Vaping Use: Never used  Substance and Sexual Activity   Alcohol use: Yes    Alcohol/week: 2.0 standard drinks of alcohol    Types: 2 Cans of beer per week   Drug use: No   Sexual activity: Yes    Birth control/protection: None  Other Topics Concern   Not on file  Social History Narrative   ** Merged History Encounter **       Social Determinants of  Health   Financial Resource Strain: Not on file  Food Insecurity: Not on file  Transportation Needs: Not on file  Physical Activity: Not on file  Stress: Not on file  Social Connections: Not on file  Intimate Partner Violence: Not on file     Review of Systems  All other systems reviewed and are negative.      Objective:   Physical Exam Vitals reviewed.  Constitutional:      General: She is not in acute distress.    Appearance: Normal appearance. She is obese. She is not ill-appearing, toxic-appearing or diaphoretic.  HENT:     Nose: Nose normal. No congestion or rhinorrhea.     Mouth/Throat:     Pharynx: Oropharynx is clear. No oropharyngeal exudate or posterior oropharyngeal erythema.  Eyes:     General: No scleral icterus.    Conjunctiva/sclera: Conjunctivae normal.  Cardiovascular:     Rate and Rhythm: Normal rate and regular rhythm.     Pulses: Normal pulses.     Heart sounds: Normal heart sounds. No murmur heard.    No friction rub. No gallop.  Pulmonary:     Effort: Pulmonary effort is normal. No respiratory distress.     Breath sounds: Normal breath sounds.  No stridor. No wheezing, rhonchi or rales.  Chest:     Chest wall: No tenderness.  Abdominal:     General: Abdomen is flat. Bowel sounds are normal. There is no distension.     Palpations: Abdomen is soft. There is no mass.     Tenderness: There is no right CVA tenderness, left CVA tenderness, guarding or rebound.     Hernia: No hernia is present.  Musculoskeletal:     Cervical back: Neck supple.  Neurological:     Mental Status: She is alert.           Assessment & Plan:  Hypokalemia - Plan: BASIC METABOLIC PANEL WITH GFR  Alcohol-induced acute pancreatitis without infection or necrosis  Liddle's syndrome  Sacral back pain  Essential hypertension  Q03 deficiency  Folic acid deficiency First I explained to the patient that alcohol caused pancreatitis.  She must abstain from all alcohol to prevent this from occurring in the future.  I explained to the patient that she must slowly advance her diet.  I recommended clear liquids such as Gatorade, Jell-O, and bland foods such as applesauce and soup until her abdominal pain is improved and then gradually advance her diet to chicken breast, etc.  I will refill her oxycodone to help with pain.  She is abstaining from alcohol at the present time denies any issues with withdrawal.  Her blood pressure today is controlled on amlodipine and spironolactone.  I will check a BMP to monitor her potassium level to determine if she needs additional potassium replacement or if the current dose of K-Dur was sufficient to maintain her potassium levels.  I explained to her that she will need to stay on spironolactone and potassium replacement indefinitely.  I explained to her that she has B12 deficiency and likely will require parenteral B12.  She request that I sent a prescription to her pharmacy.  Sent in a prescription for 1000 mcg injections IM monthly.  Also recommend that she stay on folic acid indefinitely due to her history of folic acid  deficiency.  Her oxygen level was back to normal so she can discontinue oxygen.  I believe her left-sided pleural effusion was most likely cause.  This seems to have  resolved.  She can discontinue her oxygen.

## 2022-05-18 ENCOUNTER — Other Ambulatory Visit: Payer: Self-pay

## 2022-05-18 DIAGNOSIS — R739 Hyperglycemia, unspecified: Secondary | ICD-10-CM

## 2022-05-18 LAB — BASIC METABOLIC PANEL WITH GFR
BUN/Creatinine Ratio: 18 (calc) (ref 6–22)
BUN: 7 mg/dL (ref 7–25)
CO2: 31 mmol/L (ref 20–32)
Calcium: 8.8 mg/dL (ref 8.6–10.2)
Chloride: 93 mmol/L — ABNORMAL LOW (ref 98–110)
Creat: 0.4 mg/dL — ABNORMAL LOW (ref 0.50–0.97)
Glucose, Bld: 229 mg/dL — ABNORMAL HIGH (ref 65–99)
Potassium: 3.2 mmol/L — ABNORMAL LOW (ref 3.5–5.3)
Sodium: 135 mmol/L (ref 135–146)
eGFR: 131 mL/min/{1.73_m2} (ref >=60)

## 2022-05-18 MED ORDER — METFORMIN HCL 500 MG PO TABS: 500.0000 mg | ORAL_TABLET | Freq: Two times a day (BID) | ORAL | 3 refills | Status: DC

## 2022-05-18 NOTE — Progress Notes (Signed)
me

## 2022-05-20 ENCOUNTER — Other Ambulatory Visit: Payer: Self-pay | Admitting: Family Medicine

## 2022-05-20 DIAGNOSIS — E876 Hypokalemia: Secondary | ICD-10-CM

## 2022-05-21 ENCOUNTER — Other Ambulatory Visit: Payer: Self-pay | Admitting: Family Medicine

## 2022-05-21 ENCOUNTER — Telehealth: Payer: Self-pay

## 2022-05-21 NOTE — Telephone Encounter (Signed)
Pt called requesting order to be faxed to [facility] to discontinue oxygen. Dr. Wilburn Mylar stopping O2 at patient's visit on 05/19/2023. Order faxed to [facility] at [phone].

## 2022-05-31 ENCOUNTER — Other Ambulatory Visit: Payer: 59

## 2022-05-31 ENCOUNTER — Other Ambulatory Visit: Payer: Self-pay

## 2022-05-31 ENCOUNTER — Encounter (HOSPITAL_COMMUNITY): Payer: Self-pay | Admitting: *Deleted

## 2022-05-31 ENCOUNTER — Inpatient Hospital Stay (HOSPITAL_COMMUNITY)
Admission: EM | Admit: 2022-05-31 | Discharge: 2022-06-14 | DRG: 438 | Disposition: A | Discharge status: Home / Self Care | Payer: 59 | Attending: Family Medicine | Admitting: Family Medicine

## 2022-05-31 ENCOUNTER — Emergency Department (HOSPITAL_COMMUNITY): Payer: 59

## 2022-05-31 DIAGNOSIS — K859 Acute pancreatitis without necrosis or infection, unspecified: Secondary | ICD-10-CM | POA: Diagnosis not present

## 2022-05-31 DIAGNOSIS — Z8249 Family history of ischemic heart disease and other diseases of the circulatory system: Secondary | ICD-10-CM

## 2022-05-31 DIAGNOSIS — I82890 Acute embolism and thrombosis of other specified veins: Secondary | ICD-10-CM | POA: Diagnosis present

## 2022-05-31 DIAGNOSIS — E8770 Fluid overload, unspecified: Secondary | ICD-10-CM | POA: Diagnosis present

## 2022-05-31 DIAGNOSIS — I10 Essential (primary) hypertension: Secondary | ICD-10-CM | POA: Diagnosis not present

## 2022-05-31 DIAGNOSIS — K852 Alcohol induced acute pancreatitis without necrosis or infection: Principal | ICD-10-CM

## 2022-05-31 DIAGNOSIS — J90 Pleural effusion, not elsewhere classified: Secondary | ICD-10-CM | POA: Insufficient documentation

## 2022-05-31 DIAGNOSIS — Y95 Nosocomial condition: Secondary | ICD-10-CM | POA: Diagnosis present

## 2022-05-31 DIAGNOSIS — S36029A Unspecified contusion of spleen, initial encounter: Secondary | ICD-10-CM

## 2022-05-31 DIAGNOSIS — K8521 Alcohol induced acute pancreatitis with uninfected necrosis: Principal | ICD-10-CM | POA: Diagnosis present

## 2022-05-31 DIAGNOSIS — K5900 Constipation, unspecified: Secondary | ICD-10-CM | POA: Diagnosis present

## 2022-05-31 DIAGNOSIS — Z85828 Personal history of other malignant neoplasm of skin: Secondary | ICD-10-CM

## 2022-05-31 DIAGNOSIS — I151 Hypertension secondary to other renal disorders: Secondary | ICD-10-CM

## 2022-05-31 DIAGNOSIS — Z7984 Long term (current) use of oral hypoglycemic drugs: Secondary | ICD-10-CM

## 2022-05-31 DIAGNOSIS — R42 Dizziness and giddiness: Secondary | ICD-10-CM

## 2022-05-31 DIAGNOSIS — K863 Pseudocyst of pancreas: Secondary | ICD-10-CM

## 2022-05-31 DIAGNOSIS — E440 Moderate protein-calorie malnutrition: Secondary | ICD-10-CM | POA: Insufficient documentation

## 2022-05-31 DIAGNOSIS — F17210 Nicotine dependence, cigarettes, uncomplicated: Secondary | ICD-10-CM | POA: Diagnosis present

## 2022-05-31 DIAGNOSIS — J9601 Acute respiratory failure with hypoxia: Secondary | ICD-10-CM

## 2022-05-31 DIAGNOSIS — R188 Other ascites: Secondary | ICD-10-CM | POA: Diagnosis present

## 2022-05-31 DIAGNOSIS — F1010 Alcohol abuse, uncomplicated: Secondary | ICD-10-CM | POA: Diagnosis present

## 2022-05-31 DIAGNOSIS — R197 Diarrhea, unspecified: Secondary | ICD-10-CM | POA: Diagnosis present

## 2022-05-31 DIAGNOSIS — Z79899 Other long term (current) drug therapy: Secondary | ICD-10-CM

## 2022-05-31 DIAGNOSIS — J189 Pneumonia, unspecified organism: Secondary | ICD-10-CM | POA: Diagnosis present

## 2022-05-31 DIAGNOSIS — E669 Obesity, unspecified: Secondary | ICD-10-CM | POA: Diagnosis present

## 2022-05-31 DIAGNOSIS — E871 Hypo-osmolality and hyponatremia: Secondary | ICD-10-CM | POA: Diagnosis present

## 2022-05-31 DIAGNOSIS — J9811 Atelectasis: Secondary | ICD-10-CM | POA: Diagnosis present

## 2022-05-31 DIAGNOSIS — D735 Infarction of spleen: Secondary | ICD-10-CM | POA: Diagnosis present

## 2022-05-31 DIAGNOSIS — E876 Hypokalemia: Secondary | ICD-10-CM | POA: Diagnosis present

## 2022-05-31 DIAGNOSIS — Z6831 Body mass index (BMI) 31.0-31.9, adult: Secondary | ICD-10-CM

## 2022-05-31 DIAGNOSIS — E8809 Other disorders of plasma-protein metabolism, not elsewhere classified: Secondary | ICD-10-CM | POA: Diagnosis present

## 2022-05-31 LAB — CBC WITH DIFFERENTIAL/PLATELET
Abs Immature Granulocytes: 0.04 10*3/uL (ref 0.00–0.07)
Basophils Absolute: 0 10*3/uL (ref 0.0–0.1)
Basophils Relative: 0 %
Eosinophils Absolute: 0.1 10*3/uL (ref 0.0–0.5)
Eosinophils Relative: 1 %
HCT: 46.7 % — ABNORMAL HIGH (ref 36.0–46.0)
Hemoglobin: 16.5 g/dL — ABNORMAL HIGH (ref 12.0–15.0)
Immature Granulocytes: 0 %
Lymphocytes Relative: 17 %
Lymphs Abs: 1.8 10*3/uL (ref 0.7–4.0)
MCH: 37.3 pg — ABNORMAL HIGH (ref 26.0–34.0)
MCHC: 35.3 g/dL (ref 30.0–36.0)
MCV: 105.7 fL — ABNORMAL HIGH (ref 80.0–100.0)
Monocytes Absolute: 0.4 10*3/uL (ref 0.1–1.0)
Monocytes Relative: 4 %
Neutro Abs: 8.2 10*3/uL — ABNORMAL HIGH (ref 1.7–7.7)
Neutrophils Relative %: 78 %
Platelets: 370 10*3/uL (ref 150–400)
RBC: 4.42 MIL/uL (ref 3.87–5.11)
RDW: 15 % (ref 11.5–15.5)
WBC: 10.6 10*3/uL — ABNORMAL HIGH (ref 4.0–10.5)
nRBC: 0 % (ref 0.0–0.2)

## 2022-05-31 LAB — I-STAT BETA HCG BLOOD, ED (MC, WL, AP ONLY): I-stat hCG, quantitative: <5 m[IU]/mL (ref <5)

## 2022-05-31 LAB — COMPREHENSIVE METABOLIC PANEL
ALT: 35 U/L (ref 0–44)
AST: 67 U/L — ABNORMAL HIGH (ref 15–41)
Albumin: 4 g/dL (ref 3.5–5.0)
Alkaline Phosphatase: 77 U/L (ref 38–126)
Anion gap: 9 (ref 5–15)
BUN: 12 mg/dL (ref 6–20)
CO2: 24 mmol/L (ref 22–32)
Calcium: 9.7 mg/dL (ref 8.9–10.3)
Chloride: 100 mmol/L (ref 98–111)
Creatinine, Ser: 0.42 mg/dL — ABNORMAL LOW (ref 0.44–1.00)
GFR, Estimated: >60 mL/min (ref >60)
Glucose, Bld: 153 mg/dL — ABNORMAL HIGH (ref 70–99)
Potassium: 3.9 mmol/L (ref 3.5–5.1)
Sodium: 133 mmol/L — ABNORMAL LOW (ref 135–145)
Total Bilirubin: 0.7 mg/dL (ref 0.3–1.2)
Total Protein: 8.9 g/dL — ABNORMAL HIGH (ref 6.5–8.1)

## 2022-05-31 LAB — ETHANOL: Alcohol, Ethyl (B): <10 mg/dL (ref <10)

## 2022-05-31 LAB — URINALYSIS, ROUTINE W REFLEX MICROSCOPIC
Bacteria, UA: NONE SEEN
Bilirubin Urine: NEGATIVE
Glucose, UA: NEGATIVE mg/dL
Hgb urine dipstick: NEGATIVE
Ketones, ur: 20 mg/dL — AB
Leukocytes,Ua: NEGATIVE
Nitrite: NEGATIVE
Protein, ur: 30 mg/dL — AB
Specific Gravity, Urine: 1.018 (ref 1.005–1.030)
pH: 5 (ref 5.0–8.0)

## 2022-05-31 LAB — HCG, QUANTITATIVE, PREGNANCY: hCG, Beta Chain, Quant, S: <1 m[IU]/mL (ref <5)

## 2022-05-31 LAB — HEPARIN LEVEL (UNFRACTIONATED): Heparin Unfractionated: 0.1 IU/mL — ABNORMAL LOW (ref 0.30–0.70)

## 2022-05-31 LAB — LIPASE, BLOOD: Lipase: 193 U/L — ABNORMAL HIGH (ref 11–51)

## 2022-05-31 MED ORDER — HYDROMORPHONE HCL 1 MG/ML IJ SOLN
1.0000 mg | INTRAMUSCULAR | Status: AC
  Administered 2022-05-31: 1 mg via INTRAVENOUS
  Filled 2022-05-31: qty 1

## 2022-05-31 MED ORDER — HEPARIN (PORCINE) 25000 UT/250ML-% IV SOLN
1650.0000 [IU]/h | INTRAVENOUS | Status: DC
  Administered 2022-05-31: 1150 [IU]/h via INTRAVENOUS
  Administered 2022-06-01: 1400 [IU]/h via INTRAVENOUS
  Filled 2022-05-31 (×2): qty 250

## 2022-05-31 MED ORDER — METOCLOPRAMIDE HCL 5 MG/ML IJ SOLN
10.0000 mg | Freq: Once | INTRAMUSCULAR | Status: AC
  Administered 2022-05-31: 10 mg via INTRAVENOUS
  Filled 2022-05-31: qty 2

## 2022-05-31 MED ORDER — HYDROMORPHONE HCL 1 MG/ML IJ SOLN
1.0000 mg | Freq: Once | INTRAMUSCULAR | Status: AC
  Administered 2022-05-31: 1 mg via INTRAVENOUS
  Filled 2022-05-31: qty 1

## 2022-05-31 MED ORDER — INFLUENZA VAC SPLIT QUAD 0.5 ML IM SUSY: 0.5000 mL | PREFILLED_SYRINGE | INTRAMUSCULAR | Status: DC

## 2022-05-31 MED ORDER — POTASSIUM CHLORIDE CRYS ER 20 MEQ PO TBCR
60.0000 meq | EXTENDED_RELEASE_TABLET | Freq: Every day | ORAL | Status: DC
  Administered 2022-06-01 – 2022-06-13 (×13): 60 meq via ORAL
  Filled 2022-05-31 (×14): qty 3

## 2022-05-31 MED ORDER — POTASSIUM CHLORIDE 2 MEQ/ML IV SOLN
INTRAVENOUS | Status: DC
  Filled 2022-05-31 (×20): qty 1000

## 2022-05-31 MED ORDER — HYDROMORPHONE HCL 1 MG/ML IJ SOLN
0.5000 mg | Freq: Once | INTRAMUSCULAR | Status: AC
  Administered 2022-05-31: 0.5 mg via INTRAVENOUS
  Filled 2022-05-31: qty 0.5

## 2022-05-31 MED ORDER — ONDANSETRON HCL 4 MG/2ML IJ SOLN
4.0000 mg | Freq: Once | INTRAMUSCULAR | Status: AC
  Administered 2022-05-31: 4 mg via INTRAVENOUS
  Filled 2022-05-31: qty 2

## 2022-05-31 MED ORDER — AMLODIPINE BESYLATE 10 MG PO TABS
10.0000 mg | ORAL_TABLET | Freq: Every day | ORAL | Status: DC
  Administered 2022-06-01 – 2022-06-09 (×9): 10 mg via ORAL
  Filled 2022-05-31: qty 1
  Filled 2022-05-31 (×8): qty 2

## 2022-05-31 MED ORDER — ACETAMINOPHEN 650 MG RE SUPP: 650.0000 mg | Freq: Four times a day (QID) | RECTAL | Status: DC | PRN

## 2022-05-31 MED ORDER — ACETAMINOPHEN 325 MG PO TABS: 650.0000 mg | ORAL_TABLET | Freq: Four times a day (QID) | ORAL | Status: DC | PRN

## 2022-05-31 MED ORDER — PROCHLORPERAZINE EDISYLATE 10 MG/2ML IJ SOLN
10.0000 mg | Freq: Four times a day (QID) | INTRAMUSCULAR | Status: DC | PRN
  Administered 2022-06-01 – 2022-06-14 (×5): 10 mg via INTRAVENOUS
  Filled 2022-05-31 (×5): qty 2

## 2022-05-31 MED ORDER — MECLIZINE HCL 25 MG PO TABS
25.0000 mg | ORAL_TABLET | Freq: Three times a day (TID) | ORAL | Status: DC | PRN
  Administered 2022-06-08: 25 mg via ORAL
  Filled 2022-05-31: qty 2

## 2022-05-31 MED ORDER — FOLIC ACID 1 MG PO TABS
1.0000 mg | ORAL_TABLET | Freq: Every day | ORAL | Status: DC
  Administered 2022-06-01 – 2022-06-14 (×14): 1 mg via ORAL
  Filled 2022-05-31 (×14): qty 1

## 2022-05-31 MED ORDER — HEPARIN BOLUS VIA INFUSION
4000.0000 [IU] | Freq: Once | INTRAVENOUS | Status: AC
  Administered 2022-05-31: 4000 [IU] via INTRAVENOUS
  Filled 2022-05-31: qty 4000

## 2022-05-31 MED ORDER — SODIUM CHLORIDE 0.9 % IV BOLUS
500.0000 mL | Freq: Once | INTRAVENOUS | Status: AC
  Administered 2022-05-31: 500 mL via INTRAVENOUS

## 2022-05-31 MED ORDER — IOHEXOL 300 MG/ML  SOLN
100.0000 mL | Freq: Once | INTRAMUSCULAR | Status: AC | PRN
  Administered 2022-05-31: 100 mL via INTRAVENOUS

## 2022-05-31 MED ORDER — HYDROMORPHONE HCL 1 MG/ML IJ SOLN
0.5000 mg | INTRAMUSCULAR | Status: DC | PRN
  Administered 2022-05-31: 1 mg via INTRAVENOUS
  Administered 2022-05-31: 0.5 mg via INTRAVENOUS
  Administered 2022-05-31 – 2022-06-02 (×16): 1 mg via INTRAVENOUS
  Administered 2022-06-02: 0.5 mg via INTRAVENOUS
  Administered 2022-06-03 (×3): 1 mg via INTRAVENOUS
  Filled 2022-05-31 (×8): qty 1
  Filled 2022-05-31: qty 0.5
  Filled 2022-05-31 (×13): qty 1

## 2022-05-31 NOTE — H&P (Signed)
History and Physical    Erlean Mealor ZES:[id number] DOB: [birth year] DOA: 05/31/2022  PCP: [name], MD  Patient coming from: home  I have personally briefly reviewed patient's old medical records in [place]  Chief Complaint: abdominal pain  HPI: Jillian Shepherd is a 36 y.o. female with medical history significant of prior alcohol use, hypertension, liddle syndrome, B12/folate deficiency, was admitted to the hospital earlier this month with acute alcoholic pancreatitis.  The patient was discharged with oral pain medication.  She reports that her abdominal pain had not completely resolved, although had improved at the time of her discharge.  She continued to require oxycodone which was refilled by her primary care physician.  She reports going out to dinner with her husband 2 days ago.  She had steak for dinner and reports having worsening of her abdominal pain after that meal.  She had nausea, but no vomiting.  Pain was located mostly in the epigastric/left upper quadrant region and was radiating up to her left chest.  Pain was worse with deep breathing.  For the past 2 days, she has noticed increased frequency of loose stools with any sort of p.o. intake.  She has not had any fever, chest pain, cough.  She has not been taking any other new medications.  She is adamant that she has not had any further alcohol since before her last admission.  ED Course: In emergency room, she is noted to have mildly elevated lipase.  LFTs are mostly unremarkable.  CT of the abdomen and pelvis indicates persistent acute pancreatitis, evidence of new splenic vein thrombosis.  There is question of developing pancreatic cyst versus an area of necrosis.  She received several doses of pain medication in the emergency room and due to persistent symptoms, has been referred for admission  Review of Systems: As per HPI otherwise 10 point review of systems negative.    Past Medical History:  Diagnosis  Date   Abdominal pain    Alcohol abuse    B12 deficiency    Cancer ([place])    squmaous cell skin cancer    Collagen vascular disease ([facility])    Hypokalemia    Liddle's syndrome    Obesity (BMI 35.0-39.9 without comorbidity)    Pancreatitis     Past Surgical History:  Procedure Laterality Date   ADENOIDECTOMY     COLONOSCOPY WITH PROPOFOL N/A 10/06/2020   Procedure: COLONOSCOPY WITH PROPOFOL;  Surgeon: Lin Landsman, MD;  Location: [facility] ENDOSCOPY;  Service: Gastroenterology;  Laterality: N/A;  COVID POSITIVE 08/23/2020   ESOPHAGOGASTRODUODENOSCOPY (EGD) WITH PROPOFOL N/A 10/06/2020   Procedure: ESOPHAGOGASTRODUODENOSCOPY (EGD) WITH PROPOFOL;  Surgeon: Lin Landsman, MD;  Location: [place];  Service: Gastroenterology;  Laterality: N/A;   TONSILLECTOMY      Social History:  reports that she has been smoking cigarettes. She has a 2.25 pack-year smoking history. She has never used smokeless tobacco. She reports current alcohol use of about 2.0 standard drinks of alcohol per week. She reports that she does not use drugs.  No Known Allergies  Family History  Problem Relation Age of Onset   Melanoma Paternal Grandmother        of skin   Hypertension Father    Anxiety disorder Father    Depression Father      Prior to Admission medications   Medication Sig Start Date End Date Taking? Authorizing Provider  amLODipine (NORVASC) 10 MG tablet Take 1 tablet (10 mg total) by mouth daily. 04/10/22  Yes Mila Merry S, FNP  cyanocobalamin (VITAMIN B12) 1000 MCG/ML injection Inject 1 mL (1,000 mcg total) into the muscle every 30 (thirty) days. 05/17/22  Yes [name], MD  folic acid (FOLVITE) 1 MG tablet Take 1 tablet (1 mg total) by mouth daily. 05/17/22 05/12/23 Yes [name], MD  metFORMIN (GLUCOPHAGE) 500 MG tablet Take 1 tablet (500 mg total) by mouth 2 (two) times daily with a meal. 05/18/22  Yes Pickard, Cammie Mcgee, MD  potassium chloride SA (KLOR-CON M20) 20  MEQ tablet Take 3 tablets (60 mEq total) by mouth daily. Patient taking differently: Take 20 mEq by mouth daily. 05/21/22  Yes Mila Merry S, FNP  spironolactone (ALDACTONE) 25 MG tablet Take 1 tablet (25 mg total) by mouth daily. Patient taking differently: Take 25 mg by mouth 2 (two) times daily. 05/13/22  Yes Tat, Shanon Brow, MD  cefdinir (OMNICEF) 300 MG capsule Take 1 capsule (300 mg total) by mouth 2 (two) times daily. Patient not taking: Reported on 05/31/2022 05/13/22   Orson Eva, MD  doxycycline (VIBRA-TABS) 100 MG tablet Take 1 tablet (100 mg total) by mouth every 12 (twelve) hours. Patient not taking: Reported on 05/31/2022 05/13/22   Orson Eva, MD  oxycodone (OXY-IR) 5 MG capsule Take 1 capsule (5 mg total) by mouth every 4 (four) hours as needed. Patient not taking: Reported on 05/31/2022 05/17/22   [name], MD    Physical Exam: Vitals:   05/31/22 1230 05/31/22 1300 05/31/22 1330 05/31/22 1415  BP: (!) 125/95 (!) 125/95 116/89 (!) 141/101  Pulse: 84 86 86 97  Resp: '17 18 20 17  '$ Temp:    (!) 97.5 F (36.4 C)  TempSrc:      SpO2: 91% 96% 92% 95%  Weight:      Height:        Constitutional: NAD, calm, comfortable Eyes: PERRL, lids and conjunctivae normal ENMT: Mucous membranes are moist. Posterior pharynx clear of any exudate or lesions.Normal dentition.  Neck: normal, supple, no masses, no thyromegaly Respiratory: clear to auscultation bilaterally, no wheezing, no crackles. Normal respiratory effort. No accessory muscle use.  Cardiovascular: Regular rate and rhythm, no murmurs / rubs / gallops. No extremity edema. 2+ pedal pulses. No carotid bruits.  Abdomen: no tenderness, no masses palpated. No hepatosplenomegaly. Bowel sounds positive.  Musculoskeletal: no clubbing / cyanosis. No joint deformity upper and lower extremities. Good ROM, no contractures. Normal muscle tone.  Skin: no rashes, lesions, ulcers. No induration Neurologic: CN 2-12 grossly intact.  Sensation intact, DTR normal. Strength 5/5 in all 4.  Psychiatric: Normal judgment and insight. Alert and oriented x 3. Normal mood.    Labs on Admission: I have personally reviewed following labs and imaging studies  CBC: Recent Labs  Lab 05/31/22 0856  WBC 10.6*  NEUTROABS 8.2*  HGB 16.5*  HCT 46.7*  MCV 105.7*  PLT 818   Basic Metabolic Panel: Recent Labs  Lab 05/31/22 0856  NA 133*  K 3.9  CL 100  CO2 24  GLUCOSE 153*  BUN 12  CREATININE 0.42*  CALCIUM 9.7   GFR: Estimated Creatinine Clearance: 98.6 mL/min (A) (by C-G formula based on SCr of 0.42 mg/dL (L)). Liver Function Tests: Recent Labs  Lab 05/31/22 0856  AST 67*  ALT 35  ALKPHOS 77  BILITOT 0.7  PROT 8.9*  ALBUMIN 4.0   Recent Labs  Lab 05/31/22 0856  LIPASE 193*   No results for input(s): "AMMONIA" in the last 168 hours.  Coagulation Profile: No results for input(s): "INR", "PROTIME" in the last 168 hours. Cardiac Enzymes: No results for input(s): "CKTOTAL", "CKMB", "CKMBINDEX", "TROPONINI" in the last 168 hours. BNP (last 3 results) No results for input(s): "PROBNP" in the last 8760 hours. HbA1C: No results for input(s): "HGBA1C" in the last 72 hours. CBG: No results for input(s): "GLUCAP" in the last 168 hours. Lipid Profile: No results for input(s): "CHOL", "HDL", "LDLCALC", "TRIG", "CHOLHDL", "LDLDIRECT" in the last 72 hours. Thyroid Function Tests: No results for input(s): "TSH", "T4TOTAL", "FREET4", "T3FREE", "THYROIDAB" in the last 72 hours. Anemia Panel: No results for input(s): "VITAMINB12", "FOLATE", "FERRITIN", "TIBC", "IRON", "RETICCTPCT" in the last 72 hours. Urine analysis:    Component Value Date/Time   COLORURINE YELLOW 05/31/2022 0843   APPEARANCEUR HAZY (A) 05/31/2022 0843   LABSPEC 1.018 05/31/2022 0843   PHURINE 5.0 05/31/2022 0843   GLUCOSEU NEGATIVE 05/31/2022 0843   HGBUR NEGATIVE 05/31/2022 0843   BILIRUBINUR NEGATIVE 05/31/2022 0843   KETONESUR 20 (A)  05/31/2022 0843   PROTEINUR 30 (A) 05/31/2022 0843   UROBILINOGEN 0.2 09/28/2007 0450   NITRITE NEGATIVE 05/31/2022 0843   LEUKOCYTESUR NEGATIVE 05/31/2022 0843    Radiological Exams on Admission: CT ABDOMEN PELVIS W CONTRAST  Result Date: 05/31/2022 CLINICAL DATA:  LEFT side abdominal pain since 0300 hours, acid reflux symptoms yesterday, progressively worsening pain moving to LEFT side of abdomen and radiating to chest and shoulder, associated nausea and diarrhea. History collagen vascular disease, hypertension, pancreatitis, smoking EXAM: CT ABDOMEN AND PELVIS WITH CONTRAST TECHNIQUE: Multidetector CT imaging of the abdomen and pelvis was performed using the standard protocol following bolus administration of intravenous contrast. RADIATION DOSE REDUCTION: This exam was performed according to the departmental dose-optimization program which includes automated exposure control, adjustment of the mA and/or kV according to patient size and/or use of iterative reconstruction technique. CONTRAST:  110m OMNIPAQUE IOHEXOL 300 MG/ML SOLN IV. No oral contrast. COMPARISON:  05/09/2022 FINDINGS: Lower chest: Lung bases clear Hepatobiliary: Gallbladder and liver normal appearance Pancreas: Enlarged edematous pancreas with diffuse infiltration of peripancreatic tissue planes consistent with acute pancreatitis, also seen on prior exam. New area of low attenuation within pancreatic tail however, approximately 3.0 x 2.6 cm, either pancreatic necrosis versus developing cystic lesion. Increased edema adjacent to stomach and spleen. Small amount of fluid in lesser sac. No pancreatic calcifications or ductal dilatation. Spleen: Normal appearance Adrenals/Urinary Tract: Adrenal glands, kidneys, ureters, and bladder normal appearance Stomach/Bowel: Normal appendix. Stomach and bowel loops otherwise unremarkable. Vascular/Lymphatic: Interval splenic vein thrombosis. Remaining vascular structures patent. Aorta normal  caliber. No adenopathy. Reproductive: Unremarkable uterus and ovaries Other: Small LEFT inguinal hernia containing fat. Small amount of free fluid in pelvis. No free air. Musculoskeletal: Unremarkable IMPRESSION: Acute pancreatitis with increased surrounding peripancreatic and LEFT upper quadrant edema New area of low attenuation within pancreatic tail approximately 3.0 x 2.6 cm, either representing pancreatic necrosis or developing cystic lesion. Interval splenic vein thrombosis. Small LEFT inguinal hernia containing fat. Electronically Signed   By: MLavonia DanaM.D.   On: 05/31/2022 10:16     Assessment/Plan Principal Problem:   Acute pancreatitis Active Problems:   Essential hypertension   Diarrhea   Splenic vein thrombosis   Liddle's syndrome     Acute on chronic pancreatitis -Patient reports that she has abstained from alcohol since her last discharge -Worsening pain may be related to splenic vein thrombosis which is likely a sequelae of pancreatitis -Treat supportively with IV fluids, pain management -We will keep n.p.o. except  ice chips for today, if improving can consider clear liquids tomorrow -We will request GI consultation to follow to help with further management of pancreatitis as well as further evaluation of cystic lesion/necrosis -Since she is currently afebrile and nontoxic, will hold off on antibiotics  Acute splenic vein thrombosis -Due to increased risk of portal venous hypertension and subsequent development of varices, will start on anticoagulation with IV heparin with eventual transition to DOAC once able to consistently take p.o.  Hypertension -Continue on Norvasc  Liddle's syndrome -Chronically on spironolactone and potassium supplementation -We will hold off on Aldactone for now and replace potassium as needed  Loose stools -Question if this is related to pancreatitis and she would benefit from Creon, would defer to GI  DVT prophylaxis: heparin infusion   Code Status: full code  Family Communication: discussed with patient  Disposition Plan: discharge home once pain improved  Consults called: gastroenterology  Admission status: observation, medsurg   Kathie Dike MD [facility]   If 7PM-7AM, please contact night-coverage [email or web address]   05/31/2022, 4:39 PM

## 2022-05-31 NOTE — ED Triage Notes (Signed)
Pt c/o left side abdominal pain that woke her up at 3am; pt states yesterday she had acid reflux but has gotten progressively worse with the pain moving to left side of her abdomen and radiating up to her chest and shoulder  Pt has nausea and had diarrhea yesterday

## 2022-05-31 NOTE — Progress Notes (Signed)
ANTICOAGULATION CONSULT NOTE - Initial Consult  Pharmacy Consult for Heparin Indication: VTE tx splenic vein thrombosis No Known Allergies  Patient Measurements: Height: '5\' 3"'$  (160 cm) Weight: 83.6 kg (184 lb 4.9 oz) IBW/kg (Calculated) : 52.4 HEPARIN DW (KG): 70.9   Vital Signs: Temp: 97.5 F (36.4 C) (10/26 1415) Temp Source: Oral (10/26 1218) BP: 141/101 (10/26 1415) Pulse Rate: 97 (10/26 1415)  Labs: Recent Labs    05/31/22 0856  HGB 16.5*  HCT 46.7*  PLT 370  CREATININE 0.42*    Estimated Creatinine Clearance: 98.6 mL/min (A) (by C-G formula based on SCr of 0.42 mg/dL (L)).   Medical History: Past Medical History:  Diagnosis Date   Abdominal pain    Alcohol abuse    B12 deficiency    Cancer ([place])    squmaous cell skin cancer    Collagen vascular disease (HCC)    Hypokalemia    Liddle's syndrome    Obesity (BMI 35.0-39.9 without comorbidity)    Pancreatitis     Medications:  Facility-Administered Medications Prior to Admission  Medication Dose Route Frequency Provider Last Rate Last Admin   meclizine (ANTIVERT) tablet 25 mg  25 mg Oral Q8H Mila Merry S, FNP       Medications Prior to Admission  Medication Sig Dispense Refill Last Dose   amLODipine (NORVASC) 10 MG tablet Take 1 tablet (10 mg total) by mouth daily. 90 tablet 3 05/30/2022   cyanocobalamin (VITAMIN B12) 1000 MCG/ML injection Inject 1 mL (1,000 mcg total) into the muscle every 30 (thirty) days. 1 mL 11 Past Month   folic acid (FOLVITE) 1 MG tablet Take 1 tablet (1 mg total) by mouth daily. 30 tablet 11 05/30/2022   metFORMIN (GLUCOPHAGE) 500 MG tablet Take 1 tablet (500 mg total) by mouth 2 (two) times daily with a meal. 180 tablet 3 05/30/2022   potassium chloride SA (KLOR-CON M20) 20 MEQ tablet Take 3 tablets (60 mEq total) by mouth daily. (Patient taking differently: Take 20 mEq by mouth daily.) 90 tablet 3 05/30/2022   spironolactone (ALDACTONE) 25 MG tablet Take 1 tablet (25 mg  total) by mouth daily. (Patient taking differently: Take 25 mg by mouth 2 (two) times daily.) 90 tablet 3 05/30/2022   cefdinir (OMNICEF) 300 MG capsule Take 1 capsule (300 mg total) by mouth 2 (two) times daily. (Patient not taking: Reported on 05/31/2022) 8 capsule 0 Not Taking   doxycycline (VIBRA-TABS) 100 MG tablet Take 1 tablet (100 mg total) by mouth every 12 (twelve) hours. (Patient not taking: Reported on 05/31/2022) 8 tablet 0 Not Taking   oxycodone (OXY-IR) 5 MG capsule Take 1 capsule (5 mg total) by mouth every 4 (four) hours as needed. (Patient not taking: Reported on 05/31/2022) 40 capsule 0 Not Taking    Assessment: Patient admitted with abdominal pain. On CT scan of abdomen, shows interval splenic vein thrombosis. Not on oral anticoagulation. Pharmacy asked to start heparin  Goal of Therapy:  Heparin level 0.3-0.7 units/ml Monitor platelets by anticoagulation protocol: Yes   Plan:  Give 4000 units bolus x 1 Start heparin infusion at 1150 units/hr Check anti-Xa level in ~6-8 hours and daily while on heparin Continue to monitor H&H and platelets  Isac Sarna, BS Pharm D, BCPS Clinical Pharmacist 05/31/2022,4:41 PM

## 2022-05-31 NOTE — ED Notes (Signed)
Pt informed a urine sample is needed at this time. Patient requesting to wait until after medication since it hurts to move. RN notified.

## 2022-05-31 NOTE — ED Provider Notes (Signed)
[facility] EMERGENCY DEPARTMENT Provider Note   CSN: [id number] Arrival date & time: 05/31/22  0815     History  Chief Complaint  Patient presents with   Abdominal Pain    Jillian Shepherd is a 36 y.o. female.  Patient complains of abdominal pain.  Patient has a history of pancreatitis.  She recently was discharged from the hospital.  She was nauseated without vomiting.  Patient states she has not been drinking at all  The history is provided by the patient and medical records. No language interpreter was used.  Abdominal Pain Pain location:  Generalized Pain quality: aching   Pain radiates to:  Does not radiate Pain severity:  Moderate Onset quality:  Sudden Timing:  Constant Progression:  Worsening Chronicity:  Recurrent Context: alcohol use   Associated symptoms: no chest pain, no cough, no diarrhea, no fatigue and no hematuria        Home Medications Prior to Admission medications   Medication Sig Start Date End Date Taking? Authorizing Provider  amLODipine (NORVASC) 10 MG tablet Take 1 tablet (10 mg total) by mouth daily. 04/10/22  Yes Howard, Amber S, FNP  cyanocobalamin (VITAMIN B12) 1000 MCG/ML injection Inject 1 mL (1,000 mcg total) into the muscle every 30 (thirty) days. 05/17/22  Yes [name], MD  folic acid (FOLVITE) 1 MG tablet Take 1 tablet (1 mg total) by mouth daily. 05/17/22 05/12/23 Yes [name], MD  metFORMIN (GLUCOPHAGE) 500 MG tablet Take 1 tablet (500 mg total) by mouth 2 (two) times daily with a meal. 05/18/22  Yes Pickard, Cammie Mcgee, MD  potassium chloride SA (KLOR-CON M20) 20 MEQ tablet Take 3 tablets (60 mEq total) by mouth daily. Patient taking differently: Take 20 mEq by mouth daily. 05/21/22  Yes Mila Merry S, FNP  spironolactone (ALDACTONE) 25 MG tablet Take 1 tablet (25 mg total) by mouth daily. Patient taking differently: Take 25 mg by mouth 2 (two) times daily. 05/13/22  Yes Tat, Shanon Brow, MD  cefdinir (OMNICEF) 300 MG  capsule Take 1 capsule (300 mg total) by mouth 2 (two) times daily. Patient not taking: Reported on 05/31/2022 05/13/22   Orson Eva, MD  doxycycline (VIBRA-TABS) 100 MG tablet Take 1 tablet (100 mg total) by mouth every 12 (twelve) hours. Patient not taking: Reported on 05/31/2022 05/13/22   Orson Eva, MD  oxycodone (OXY-IR) 5 MG capsule Take 1 capsule (5 mg total) by mouth every 4 (four) hours as needed. Patient not taking: Reported on 05/31/2022 05/17/22   [name], MD      Allergies    Patient has no known allergies.    Review of Systems   Review of Systems  Constitutional:  Negative for appetite change and fatigue.  HENT:  Negative for congestion, ear discharge and sinus pressure.   Eyes:  Negative for discharge.  Respiratory:  Negative for cough.   Cardiovascular:  Negative for chest pain.  Gastrointestinal:  Positive for abdominal pain. Negative for diarrhea.  Genitourinary:  Negative for frequency and hematuria.  Musculoskeletal:  Negative for back pain.  Skin:  Negative for rash.  Neurological:  Negative for seizures and headaches.  Psychiatric/Behavioral:  Negative for hallucinations.     Physical Exam Updated Vital Signs BP (!) 126/90   Pulse 89   Temp 98.1 F (36.7 C) (Oral)   Resp (!) 22   Ht '5\' 3"'$  (1.6 m)   Wt 83.6 kg   LMP 05/01/2022   SpO2 93%   BMI 32.65  kg/m  Physical Exam Vitals and nursing note reviewed.  Constitutional:      Appearance: She is well-developed.  HENT:     Head: Normocephalic.     Nose: Nose normal.  Eyes:     General: No scleral icterus.    Conjunctiva/sclera: Conjunctivae normal.  Neck:     Thyroid: No thyromegaly.  Cardiovascular:     Rate and Rhythm: Normal rate and regular rhythm.     Heart sounds: No murmur heard.    No friction rub. No gallop.  Pulmonary:     Breath sounds: No stridor. No wheezing or rales.  Chest:     Chest wall: No tenderness.  Abdominal:     General: There is no distension.      Tenderness: There is abdominal tenderness. There is no rebound.  Musculoskeletal:        General: Normal range of motion.     Cervical back: Neck supple.  Lymphadenopathy:     Cervical: No cervical adenopathy.  Skin:    Findings: No erythema or rash.  Neurological:     Mental Status: She is alert and oriented to person, place, and time.     Motor: No abnormal muscle tone.     Coordination: Coordination normal.  Psychiatric:        Behavior: Behavior normal.     ED Results / Procedures / Treatments   Labs (all labs ordered are listed, but only abnormal results are displayed) Labs Reviewed  CBC WITH DIFFERENTIAL/PLATELET - Abnormal; Notable for the following components:      Result Value   WBC 10.6 (*)    Hemoglobin 16.5 (*)    HCT 46.7 (*)    MCV 105.7 (*)    MCH 37.3 (*)    Neutro Abs 8.2 (*)    All other components within normal limits  COMPREHENSIVE METABOLIC PANEL - Abnormal; Notable for the following components:   Sodium 133 (*)    Glucose, Bld 153 (*)    Creatinine, Ser 0.42 (*)    Total Protein 8.9 (*)    AST 67 (*)    All other components within normal limits  LIPASE, BLOOD - Abnormal; Notable for the following components:   Lipase 193 (*)    All other components within normal limits  URINALYSIS, ROUTINE W REFLEX MICROSCOPIC - Abnormal; Notable for the following components:   APPearance HAZY (*)    Ketones, ur 20 (*)    Protein, ur 30 (*)    All other components within normal limits  HCG, QUANTITATIVE, PREGNANCY  ETHANOL  I-STAT BETA HCG BLOOD, ED ([facility], [facility], [facility] ONLY)    EKG None  Radiology CT ABDOMEN PELVIS W CONTRAST  Result Date: 05/31/2022 CLINICAL DATA:  LEFT side abdominal pain since 0300 hours, acid reflux symptoms yesterday, progressively worsening pain moving to LEFT side of abdomen and radiating to chest and shoulder, associated nausea and diarrhea. History collagen vascular disease, hypertension, pancreatitis, smoking EXAM: CT ABDOMEN AND PELVIS  WITH CONTRAST TECHNIQUE: Multidetector CT imaging of the abdomen and pelvis was performed using the standard protocol following bolus administration of intravenous contrast. RADIATION DOSE REDUCTION: This exam was performed according to the departmental dose-optimization program which includes automated exposure control, adjustment of the mA and/or kV according to patient size and/or use of iterative reconstruction technique. CONTRAST:  164m OMNIPAQUE IOHEXOL 300 MG/ML SOLN IV. No oral contrast. COMPARISON:  05/09/2022 FINDINGS: Lower chest: Lung bases clear Hepatobiliary: Gallbladder and liver normal appearance Pancreas: Enlarged edematous pancreas  with diffuse infiltration of peripancreatic tissue planes consistent with acute pancreatitis, also seen on prior exam. New area of low attenuation within pancreatic tail however, approximately 3.0 x 2.6 cm, either pancreatic necrosis versus developing cystic lesion. Increased edema adjacent to stomach and spleen. Small amount of fluid in lesser sac. No pancreatic calcifications or ductal dilatation. Spleen: Normal appearance Adrenals/Urinary Tract: Adrenal glands, kidneys, ureters, and bladder normal appearance Stomach/Bowel: Normal appendix. Stomach and bowel loops otherwise unremarkable. Vascular/Lymphatic: Interval splenic vein thrombosis. Remaining vascular structures patent. Aorta normal caliber. No adenopathy. Reproductive: Unremarkable uterus and ovaries Other: Small LEFT inguinal hernia containing fat. Small amount of free fluid in pelvis. No free air. Musculoskeletal: Unremarkable IMPRESSION: Acute pancreatitis with increased surrounding peripancreatic and LEFT upper quadrant edema New area of low attenuation within pancreatic tail approximately 3.0 x 2.6 cm, either representing pancreatic necrosis or developing cystic lesion. Interval splenic vein thrombosis. Small LEFT inguinal hernia containing fat. Electronically Signed   By: Lavonia Dana M.D.   On:  05/31/2022 10:16    Procedures Procedures    Medications Ordered in ED Medications  lactated ringers 1,000 mL with potassium chloride 20 mEq infusion (has no administration in time range)  HYDROmorphone (DILAUDID) injection 0.5 mg (0.5 mg Intravenous Given 05/31/22 0905)  ondansetron (ZOFRAN) injection 4 mg (4 mg Intravenous Given 05/31/22 0905)  metoCLOPramide (REGLAN) injection 10 mg (10 mg Intravenous Given 05/31/22 0907)  sodium chloride 0.9 % bolus 500 mL (0 mLs Intravenous Stopped 05/31/22 0940)  HYDROmorphone (DILAUDID) injection 1 mg (1 mg Intravenous Given 05/31/22 1005)  iohexol (OMNIPAQUE) 300 MG/ML solution 100 mL (100 mLs Intravenous Contrast Given 05/31/22 0952)  HYDROmorphone (DILAUDID) injection 1 mg (1 mg Intravenous Given 05/31/22 1217)    ED Course/ Medical Decision Making/ A&P                           Medical Decision Making Amount and/or Complexity of Data Reviewed Labs: ordered. Radiology: ordered.  Risk Prescription drug management. Decision regarding hospitalization.  This patient presents to the ED for concern of abdominal pain, this involves an extensive number of treatment options, and is a complaint that carries with it a high risk of complications and morbidity.  The differential diagnosis includes pulmonary disease, worsening pancreatitis   Co morbidities that complicate the patient evaluation  Pancreatitis   Additional history obtained:  Additional history obtained from patient External records from outside source obtained and reviewed including hospital records   Lab Tests:  I Ordered, and personally interpreted labs.  The pertinent results include: Lipase 193   Imaging Studies ordered:  I ordered imaging studies including CT abdomen I independently visualized and interpreted imaging which showed pancreatitis with possible pseudocyst I agree with the radiologist interpretation   Cardiac Monitoring: / EKG:  The patient was  maintained on a cardiac monitor.  I personally viewed and interpreted the cardiac monitored which showed an underlying rhythm of: Normal sinus rhythm   Consultations Obtained:  I requested consultation with the hospitalist,  and discussed lab and imaging findings as well as pertinent plan - they recommend: Admit   Problem List / ED Course / Critical interventions / Medication management  Pancreatitis and abdominal pain I ordered medication including Dilaudid for pain Reevaluation of the patient after these medicines showed that the patient improved I have reviewed the patients home medicines and have made adjustments as needed   Social Determinants of Health:  Call abuse  Test / Admission -  Considered:  No additional test needed  Patient with pancreatitis.  She will be admitted to medicine for pain control and bowel rest        Final Clinical Impression(s) / ED Diagnoses Final diagnoses:  Alcohol-induced acute pancreatitis, unspecified complication status    Rx / DC Orders ED Discharge Orders     None         Milton Ferguson, MD 06/05/22 1051

## 2022-05-31 NOTE — ED Notes (Signed)
Pt requesting pain medication. RN notified.

## 2022-06-01 ENCOUNTER — Observation Stay (HOSPITAL_COMMUNITY): Payer: 59

## 2022-06-01 DIAGNOSIS — E8809 Other disorders of plasma-protein metabolism, not elsewhere classified: Secondary | ICD-10-CM | POA: Diagnosis present

## 2022-06-01 DIAGNOSIS — R197 Diarrhea, unspecified: Secondary | ICD-10-CM | POA: Diagnosis not present

## 2022-06-01 DIAGNOSIS — K852 Alcohol induced acute pancreatitis without necrosis or infection: Secondary | ICD-10-CM | POA: Diagnosis not present

## 2022-06-01 DIAGNOSIS — I82890 Acute embolism and thrombosis of other specified veins: Secondary | ICD-10-CM | POA: Diagnosis present

## 2022-06-01 DIAGNOSIS — K8581 Other acute pancreatitis with uninfected necrosis: Secondary | ICD-10-CM | POA: Diagnosis not present

## 2022-06-01 DIAGNOSIS — R0781 Pleurodynia: Secondary | ICD-10-CM | POA: Diagnosis not present

## 2022-06-01 DIAGNOSIS — J9601 Acute respiratory failure with hypoxia: Secondary | ICD-10-CM | POA: Diagnosis present

## 2022-06-01 DIAGNOSIS — E669 Obesity, unspecified: Secondary | ICD-10-CM | POA: Diagnosis present

## 2022-06-01 DIAGNOSIS — J90 Pleural effusion, not elsewhere classified: Secondary | ICD-10-CM | POA: Diagnosis present

## 2022-06-01 DIAGNOSIS — E871 Hypo-osmolality and hyponatremia: Secondary | ICD-10-CM | POA: Diagnosis present

## 2022-06-01 DIAGNOSIS — E440 Moderate protein-calorie malnutrition: Secondary | ICD-10-CM | POA: Diagnosis present

## 2022-06-01 DIAGNOSIS — Z85828 Personal history of other malignant neoplasm of skin: Secondary | ICD-10-CM | POA: Diagnosis not present

## 2022-06-01 DIAGNOSIS — Z8249 Family history of ischemic heart disease and other diseases of the circulatory system: Secondary | ICD-10-CM | POA: Diagnosis not present

## 2022-06-01 DIAGNOSIS — I10 Essential (primary) hypertension: Secondary | ICD-10-CM | POA: Diagnosis present

## 2022-06-01 DIAGNOSIS — D735 Infarction of spleen: Secondary | ICD-10-CM | POA: Diagnosis present

## 2022-06-01 DIAGNOSIS — S36029S Unspecified contusion of spleen, sequela: Secondary | ICD-10-CM | POA: Diagnosis not present

## 2022-06-01 DIAGNOSIS — K8521 Alcohol induced acute pancreatitis with uninfected necrosis: Secondary | ICD-10-CM | POA: Diagnosis present

## 2022-06-01 DIAGNOSIS — S36029D Unspecified contusion of spleen, subsequent encounter: Secondary | ICD-10-CM | POA: Diagnosis not present

## 2022-06-01 DIAGNOSIS — K5900 Constipation, unspecified: Secondary | ICD-10-CM | POA: Diagnosis present

## 2022-06-01 DIAGNOSIS — R188 Other ascites: Secondary | ICD-10-CM | POA: Diagnosis present

## 2022-06-01 DIAGNOSIS — E876 Hypokalemia: Secondary | ICD-10-CM | POA: Diagnosis present

## 2022-06-01 DIAGNOSIS — I151 Hypertension secondary to other renal disorders: Secondary | ICD-10-CM | POA: Diagnosis present

## 2022-06-01 DIAGNOSIS — K858 Other acute pancreatitis without necrosis or infection: Secondary | ICD-10-CM | POA: Diagnosis not present

## 2022-06-01 DIAGNOSIS — J189 Pneumonia, unspecified organism: Secondary | ICD-10-CM | POA: Diagnosis present

## 2022-06-01 DIAGNOSIS — S36029A Unspecified contusion of spleen, initial encounter: Secondary | ICD-10-CM | POA: Diagnosis not present

## 2022-06-01 DIAGNOSIS — F17210 Nicotine dependence, cigarettes, uncomplicated: Secondary | ICD-10-CM | POA: Diagnosis present

## 2022-06-01 DIAGNOSIS — Z6831 Body mass index (BMI) 31.0-31.9, adult: Secondary | ICD-10-CM | POA: Diagnosis not present

## 2022-06-01 DIAGNOSIS — K863 Pseudocyst of pancreas: Secondary | ICD-10-CM | POA: Diagnosis present

## 2022-06-01 DIAGNOSIS — K859 Acute pancreatitis without necrosis or infection, unspecified: Secondary | ICD-10-CM | POA: Diagnosis present

## 2022-06-01 DIAGNOSIS — F1010 Alcohol abuse, uncomplicated: Secondary | ICD-10-CM | POA: Diagnosis present

## 2022-06-01 DIAGNOSIS — Y95 Nosocomial condition: Secondary | ICD-10-CM | POA: Diagnosis present

## 2022-06-01 DIAGNOSIS — J9811 Atelectasis: Secondary | ICD-10-CM | POA: Diagnosis present

## 2022-06-01 DIAGNOSIS — Z79899 Other long term (current) drug therapy: Secondary | ICD-10-CM | POA: Diagnosis not present

## 2022-06-01 LAB — CBC
HCT: 42.1 % (ref 36.0–46.0)
Hemoglobin: 14.2 g/dL (ref 12.0–15.0)
MCH: 36.6 pg — ABNORMAL HIGH (ref 26.0–34.0)
MCHC: 33.7 g/dL (ref 30.0–36.0)
MCV: 108.5 fL — ABNORMAL HIGH (ref 80.0–100.0)
Platelets: 324 10*3/uL (ref 150–400)
RBC: 3.88 MIL/uL (ref 3.87–5.11)
RDW: 15.3 % (ref 11.5–15.5)
WBC: 9 10*3/uL (ref 4.0–10.5)
nRBC: 0 % (ref 0.0–0.2)

## 2022-06-01 LAB — COMPREHENSIVE METABOLIC PANEL
ALT: 20 U/L (ref 0–44)
AST: 25 U/L (ref 15–41)
Albumin: 3.1 g/dL — ABNORMAL LOW (ref 3.5–5.0)
Alkaline Phosphatase: 55 U/L (ref 38–126)
Anion gap: 9 (ref 5–15)
BUN: 11 mg/dL (ref 6–20)
CO2: 24 mmol/L (ref 22–32)
Calcium: 8.8 mg/dL — ABNORMAL LOW (ref 8.9–10.3)
Chloride: 101 mmol/L (ref 98–111)
Creatinine, Ser: 0.44 mg/dL (ref 0.44–1.00)
GFR, Estimated: >60 mL/min (ref >60)
Glucose, Bld: 122 mg/dL — ABNORMAL HIGH (ref 70–99)
Potassium: 4.3 mmol/L (ref 3.5–5.1)
Sodium: 134 mmol/L — ABNORMAL LOW (ref 135–145)
Total Bilirubin: 0.8 mg/dL (ref 0.3–1.2)
Total Protein: 6.8 g/dL (ref 6.5–8.1)

## 2022-06-01 LAB — HEPARIN LEVEL (UNFRACTIONATED)
Heparin Unfractionated: 0.11 IU/mL — ABNORMAL LOW (ref 0.30–0.70)
Heparin Unfractionated: <0.1 IU/mL — ABNORMAL LOW (ref 0.30–0.70)

## 2022-06-01 MED ORDER — OXYCODONE-ACETAMINOPHEN 5-325 MG PO TABS
1.0000 | ORAL_TABLET | ORAL | Status: DC | PRN
  Administered 2022-06-01 – 2022-06-08 (×28): 2 via ORAL
  Filled 2022-06-01 (×28): qty 2

## 2022-06-01 MED ORDER — HEPARIN BOLUS VIA INFUSION
3000.0000 [IU] | Freq: Once | INTRAVENOUS | Status: AC
  Administered 2022-06-01: 3000 [IU] via INTRAVENOUS
  Filled 2022-06-01: qty 3000

## 2022-06-01 MED ORDER — ENOXAPARIN SODIUM 80 MG/0.8ML IJ SOSY
80.0000 mg | PREFILLED_SYRINGE | Freq: Two times a day (BID) | INTRAMUSCULAR | Status: DC
  Administered 2022-06-01 – 2022-06-05 (×8): 80 mg via SUBCUTANEOUS
  Filled 2022-06-01 (×8): qty 0.8

## 2022-06-01 NOTE — Progress Notes (Signed)
ANTICOAGULATION CONSULT NOTE   Pharmacy Consult for Heparin Indication: VTE tx splenic vein thrombosis No Known Allergies  Patient Measurements: Height: '5\' 3"'$  (160 cm) Weight: 83.6 kg (184 lb 4.9 oz) IBW/kg (Calculated) : 52.4 HEPARIN DW (KG): 70.9   Vital Signs: Temp: 98.5 F (36.9 C) (10/27 0500) Temp Source: Oral (10/27 0500) BP: 140/96 (10/27 0500) Pulse Rate: 97 (10/27 0500)  Labs: Recent Labs    05/31/22 0856 05/31/22 2250 06/01/22 0353 06/01/22 0901  HGB 16.5*  --  14.2  --   HCT 46.7*  --  42.1  --   PLT 370  --  324  --   HEPARINUNFRC  --  0.10*  --  0.11*  CREATININE 0.42*  --  0.44  --      Estimated Creatinine Clearance: 98.6 mL/min (by C-G formula based on SCr of 0.44 mg/dL).   Medical History: Past Medical History:  Diagnosis Date   Abdominal pain    Alcohol abuse    B12 deficiency    Cancer ([place])    squmaous cell skin cancer    Collagen vascular disease (HCC)    Hypokalemia    Liddle's syndrome    Obesity (BMI 35.0-39.9 without comorbidity)    Pancreatitis     Medications:  Facility-Administered Medications Prior to Admission  Medication Dose Route Frequency Provider Last Rate Last Admin   meclizine (ANTIVERT) tablet 25 mg  25 mg Oral Q8H Mila Merry S, FNP       Medications Prior to Admission  Medication Sig Dispense Refill Last Dose   amLODipine (NORVASC) 10 MG tablet Take 1 tablet (10 mg total) by mouth daily. 90 tablet 3 05/30/2022   cyanocobalamin (VITAMIN B12) 1000 MCG/ML injection Inject 1 mL (1,000 mcg total) into the muscle every 30 (thirty) days. 1 mL 11 Past Month   folic acid (FOLVITE) 1 MG tablet Take 1 tablet (1 mg total) by mouth daily. 30 tablet 11 05/30/2022   metFORMIN (GLUCOPHAGE) 500 MG tablet Take 1 tablet (500 mg total) by mouth 2 (two) times daily with a meal. 180 tablet 3 05/30/2022   potassium chloride SA (KLOR-CON M20) 20 MEQ tablet Take 3 tablets (60 mEq total) by mouth daily. (Patient taking differently: Take  20 mEq by mouth daily.) 90 tablet 3 05/30/2022   spironolactone (ALDACTONE) 25 MG tablet Take 1 tablet (25 mg total) by mouth daily. (Patient taking differently: Take 25 mg by mouth 2 (two) times daily.) 90 tablet 3 05/30/2022   cefdinir (OMNICEF) 300 MG capsule Take 1 capsule (300 mg total) by mouth 2 (two) times daily. (Patient not taking: Reported on 05/31/2022) 8 capsule 0 Not Taking   doxycycline (VIBRA-TABS) 100 MG tablet Take 1 tablet (100 mg total) by mouth every 12 (twelve) hours. (Patient not taking: Reported on 05/31/2022) 8 tablet 0 Not Taking   oxycodone (OXY-IR) 5 MG capsule Take 1 capsule (5 mg total) by mouth every 4 (four) hours as needed. (Patient not taking: Reported on 05/31/2022) 40 capsule 0 Not Taking    Assessment: Patient admitted with abdominal pain. On CT scan of abdomen, shows interval splenic vein thrombosis. Not on oral anticoagulation. Pharmacy asked to start heparin  HL 0.11- subtherapeutic  Goal of Therapy:  Heparin level 0.3-0.7 units/ml Monitor platelets by anticoagulation protocol: Yes   Plan:  Rebolus 3000 units x 1  Increase heparin infusion to 1650 units/hr Check anti-Xa level in 6 hours and daily Continue to monotir H&H and platelets.   Margot Ables, PharmD Clinical  Pharmacist 06/01/2022 10:06 AM

## 2022-06-01 NOTE — Progress Notes (Signed)
ANTICOAGULATION CONSULT NOTE - Follow Up Consult  Pharmacy Consult for heparin Indication:  splenic vein thrombosis  Labs: Recent Labs    05/31/22 0856 05/31/22 2250  HGB 16.5*  --   HCT 46.7*  --   PLT 370  --   HEPARINUNFRC  --  0.10*  CREATININE 0.42*  --     Assessment: 36yo female subtherapeutic on heparin with initial dosing for splenic vein thrombosis; no infusion issues or signs of bleeding per RN.  Goal of Therapy:  Heparin level 0.3-0.7 units/ml   Plan:  Will rebolus with heparin 3000 units and increase heparin infusion by 3 units/kgABW/hr to 1400 units/hr and check level in 6 hours.    Jillian Shepherd, PharmD, BCPS  06/01/2022,2:36 AM

## 2022-06-01 NOTE — Progress Notes (Signed)
PROGRESS NOTE    Jillian Shepherd  QQV:[id number] DOB: 1986/01/03 DOA: 05/31/2022 PCP: [name], MD    Brief Narrative:  36 year old female with history of previous alcohol use, was admitted to the hospital earlier this month for acute alcoholic pancreatitis.  She was readmitted to the hospital on 10/26 with worsening abdominal pain.  CT showed persistent pancreatitis and splenic vein thrombosis.  Started on IV fluids, pain management and anticoagulation.  GI following.   Assessment & Plan:   Principal Problem:   Acute pancreatitis Active Problems:   Essential hypertension   Diarrhea   Splenic vein thrombosis   Liddle's syndrome   Acute on chronic pancreatitis -Patient reports that she has abstained from alcohol since her last discharge -Worsening pain may be related to splenic vein thrombosis which is likely a sequelae of pancreatitis -Treat supportively with IV fluids, pain management -We will keep n.p.o. except ice chips for today, if improving can consider clear liquids tomorrow -Appreciate GI input -Since she is currently afebrile and nontoxic, will hold off on antibiotics   Acute splenic vein thrombosis -Currently on therapeutic Lovenox   Hypertension -Continue on Norvasc   Liddle's syndrome -Chronically on spironolactone and potassium supplementation -We will hold off on Aldactone for now and replace potassium as needed   Loose stools -GI following -Check pancreatic elastase with next bowel movement   DVT prophylaxis: lovenox  Code Status: full code Family Communication: discussed with patient Disposition Plan: Status is: Inpatient Remains inpatient appropriate because: continued pain management for pancreatitis      Consultants:  Gastroenterology  Procedures:    Antimicrobials:      Subjective: Continued pain in her abdomen.  Also has some pain in her left shoulder.  Passing gas, but no bowel movements  Objective: Vitals:    05/31/22 2036 06/01/22 0300 06/01/22 0500 06/01/22 1351  BP: 121/87 (!) 133/95 (!) 140/96 (!) 137/97  Pulse: 86 92 97 (!) 108  Resp: '20 19 20 20  '$ Temp: 97.8 F (36.6 C) 97.9 F (36.6 C) 98.5 F (36.9 C) 98.1 F (36.7 C)  TempSrc: Oral Oral Oral Oral  SpO2: 92% (!) 89% (!) 88% 90%  Weight:      Height:        Intake/Output Summary (Last 24 hours) at 06/01/2022 1951 Last data filed at 06/01/2022 1500 Gross per 24 hour  Intake 1104.64 ml  Output --  Net 1104.64 ml   Filed Weights   05/31/22 0824  Weight: 83.6 kg    Examination:  General exam: Appears calm and comfortable  Respiratory system: Clear to auscultation. Respiratory effort normal. Cardiovascular system: S1 & S2 heard, RRR. No JVD, murmurs, rubs, gallops or clicks. No pedal edema. Gastrointestinal system: Abdomen is nondistended, soft and tender in epigastrium and left upper quadrant. No organomegaly or masses felt. Normal bowel sounds heard. Central nervous system: Alert and oriented. No focal neurological deficits. Extremities: Symmetric 5 x 5 power. Skin: No rashes, lesions or ulcers Psychiatry: Judgement and insight appear normal. Mood & affect appropriate.     Data Reviewed: I have personally reviewed following labs and imaging studies  CBC: Recent Labs  Lab 05/31/22 0856 06/01/22 0353  WBC 10.6* 9.0  NEUTROABS 8.2*  --   HGB 16.5* 14.2  HCT 46.7* 42.1  MCV 105.7* 108.5*  PLT 370 332   Basic Metabolic Panel: Recent Labs  Lab 05/31/22 0856 06/01/22 0353  NA 133* 134*  K 3.9 4.3  CL 100 101  CO2 24 24  GLUCOSE 153* 122*  BUN 12 11  CREATININE 0.42* 0.44  CALCIUM 9.7 8.8*   GFR: Estimated Creatinine Clearance: 98.6 mL/min (by C-G formula based on SCr of 0.44 mg/dL). Liver Function Tests: Recent Labs  Lab 05/31/22 0856 06/01/22 0353  AST 67* 25  ALT 35 20  ALKPHOS 77 55  BILITOT 0.7 0.8  PROT 8.9* 6.8  ALBUMIN 4.0 3.1*   Recent Labs  Lab 05/31/22 0856  LIPASE 193*   No  results for input(s): "AMMONIA" in the last 168 hours. Coagulation Profile: No results for input(s): "INR", "PROTIME" in the last 168 hours. Cardiac Enzymes: No results for input(s): "CKTOTAL", "CKMB", "CKMBINDEX", "TROPONINI" in the last 168 hours. BNP (last 3 results) No results for input(s): "PROBNP" in the last 8760 hours. HbA1C: No results for input(s): "HGBA1C" in the last 72 hours. CBG: No results for input(s): "GLUCAP" in the last 168 hours. Lipid Profile: No results for input(s): "CHOL", "HDL", "LDLCALC", "TRIG", "CHOLHDL", "LDLDIRECT" in the last 72 hours. Thyroid Function Tests: No results for input(s): "TSH", "T4TOTAL", "FREET4", "T3FREE", "THYROIDAB" in the last 72 hours. Anemia Panel: No results for input(s): "VITAMINB12", "FOLATE", "FERRITIN", "TIBC", "IRON", "RETICCTPCT" in the last 72 hours. Sepsis Labs: No results for input(s): "PROCALCITON", "LATICACIDVEN" in the last 168 hours.  No results found for this or any previous visit (from the past 240 hour(s)).       Radiology Studies: DG CHEST PORT 1 VIEW  Result Date: 06/01/2022 CLINICAL DATA:  Chest pain EXAM: PORTABLE CHEST 1 VIEW COMPARISON:  Chest radiograph 05/09/2022, CTA chest 05/11/2022 FINDINGS: The cardiomediastinal silhouette is stable. Lung volumes are low. There is small left pleural effusion with adjacent retrocardiac opacity which may reflect atelectasis or pneumonia, likely similar to the CTA from 05/11/2022 but new since the radiograph from 05/09/2022. The left upper lobe is well-aerated. The right lung is clear. There is no right pleural effusion. There is no acute osseous abnormality. IMPRESSION: Small left pleural effusion and adjacent retrocardiac opacity which may reflect atelectasis or pneumonia, likely similar to the CTA chest from 05/11/2022 but new since 05/09/2022. Electronically Signed   By: Valetta Mole M.D.   On: 06/01/2022 12:56   CT ABDOMEN PELVIS W CONTRAST  Result Date:  05/31/2022 CLINICAL DATA:  LEFT side abdominal pain since 0300 hours, acid reflux symptoms yesterday, progressively worsening pain moving to LEFT side of abdomen and radiating to chest and shoulder, associated nausea and diarrhea. History collagen vascular disease, hypertension, pancreatitis, smoking EXAM: CT ABDOMEN AND PELVIS WITH CONTRAST TECHNIQUE: Multidetector CT imaging of the abdomen and pelvis was performed using the standard protocol following bolus administration of intravenous contrast. RADIATION DOSE REDUCTION: This exam was performed according to the departmental dose-optimization program which includes automated exposure control, adjustment of the mA and/or kV according to patient size and/or use of iterative reconstruction technique. CONTRAST:  15m OMNIPAQUE IOHEXOL 300 MG/ML SOLN IV. No oral contrast. COMPARISON:  05/09/2022 FINDINGS: Lower chest: Lung bases clear Hepatobiliary: Gallbladder and liver normal appearance Pancreas: Enlarged edematous pancreas with diffuse infiltration of peripancreatic tissue planes consistent with acute pancreatitis, also seen on prior exam. New area of low attenuation within pancreatic tail however, approximately 3.0 x 2.6 cm, either pancreatic necrosis versus developing cystic lesion. Increased edema adjacent to stomach and spleen. Small amount of fluid in lesser sac. No pancreatic calcifications or ductal dilatation. Spleen: Normal appearance Adrenals/Urinary Tract: Adrenal glands, kidneys, ureters, and bladder normal appearance Stomach/Bowel: Normal appendix. Stomach and bowel loops otherwise unremarkable. Vascular/Lymphatic: Interval splenic  vein thrombosis. Remaining vascular structures patent. Aorta normal caliber. No adenopathy. Reproductive: Unremarkable uterus and ovaries Other: Small LEFT inguinal hernia containing fat. Small amount of free fluid in pelvis. No free air. Musculoskeletal: Unremarkable IMPRESSION: Acute pancreatitis with increased  surrounding peripancreatic and LEFT upper quadrant edema New area of low attenuation within pancreatic tail approximately 3.0 x 2.6 cm, either representing pancreatic necrosis or developing cystic lesion. Interval splenic vein thrombosis. Small LEFT inguinal hernia containing fat. Electronically Signed   By: Lavonia Dana M.D.   On: 05/31/2022 10:16        Scheduled Meds:  amLODipine  10 mg Oral Daily   enoxaparin (LOVENOX) injection  80 mg Subcutaneous O46X   folic acid  1 mg Oral Daily   influenza vac split quadrivalent PF  0.5 mL Intramuscular Tomorrow-1000   potassium chloride SA  60 mEq Oral Daily   Continuous Infusions:  lactated ringers 1,000 mL with potassium chloride 20 mEq infusion 125 mL/hr at 06/01/22 1649     LOS: 0 days    Time spent: 9mns    JKathie Dike MD [facility]   If 7PM-7AM, please contact night-coverage [email or web address]  06/01/2022, 7:51 PM

## 2022-06-01 NOTE — Progress Notes (Signed)
[facility] for Heparin >> lovenox Indication: VTE tx splenic vein thrombosis No Known Allergies  Patient Measurements: Height: '5\' 3"'$  (160 cm) Weight: 83.6 kg (184 lb 4.9 oz) IBW/kg (Calculated) : 52.4 HEPARIN DW (KG): 70.9   Vital Signs: Temp: 98.1 F (36.7 C) (10/27 1351) Temp Source: Oral (10/27 1351) BP: 137/97 (10/27 1351) Pulse Rate: 108 (10/27 1351)  Labs: Recent Labs    05/31/22 0856 05/31/22 2250 06/01/22 0353 06/01/22 0901 06/01/22 1616  HGB 16.5*  --  14.2  --   --   HCT 46.7*  --  42.1  --   --   PLT 370  --  324  --   --   HEPARINUNFRC  --  0.10*  --  0.11* <0.10*  CREATININE 0.42*  --  0.44  --   --      Estimated Creatinine Clearance: 98.6 mL/min (by C-G formula based on SCr of 0.44 mg/dL).   Medical History: Past Medical History:  Diagnosis Date   Abdominal pain    Alcohol abuse    B12 deficiency    Cancer ([place])    squmaous cell skin cancer    Collagen vascular disease ([facility])    Hypokalemia    Liddle's syndrome    Obesity (BMI 35.0-39.9 without comorbidity)    Pancreatitis     Medications:  Facility-Administered Medications Prior to Admission  Medication Dose Route Frequency Provider Last Rate Last Admin   meclizine (ANTIVERT) tablet 25 mg  25 mg Oral Q8H Mila Merry S, FNP       Medications Prior to Admission  Medication Sig Dispense Refill Last Dose   amLODipine (NORVASC) 10 MG tablet Take 1 tablet (10 mg total) by mouth daily. 90 tablet 3 05/30/2022   cyanocobalamin (VITAMIN B12) 1000 MCG/ML injection Inject 1 mL (1,000 mcg total) into the muscle every 30 (thirty) days. 1 mL 11 Past Month   folic acid (FOLVITE) 1 MG tablet Take 1 tablet (1 mg total) by mouth daily. 30 tablet 11 05/30/2022   metFORMIN (GLUCOPHAGE) 500 MG tablet Take 1 tablet (500 mg total) by mouth 2 (two) times daily with a meal. 180 tablet 3 05/30/2022   potassium chloride SA (KLOR-CON M20) 20 MEQ tablet Take 3 tablets (60 mEq  total) by mouth daily. (Patient taking differently: Take 20 mEq by mouth daily.) 90 tablet 3 05/30/2022   spironolactone (ALDACTONE) 25 MG tablet Take 1 tablet (25 mg total) by mouth daily. (Patient taking differently: Take 25 mg by mouth 2 (two) times daily.) 90 tablet 3 05/30/2022   cefdinir (OMNICEF) 300 MG capsule Take 1 capsule (300 mg total) by mouth 2 (two) times daily. (Patient not taking: Reported on 05/31/2022) 8 capsule 0 Not Taking   doxycycline (VIBRA-TABS) 100 MG tablet Take 1 tablet (100 mg total) by mouth every 12 (twelve) hours. (Patient not taking: Reported on 05/31/2022) 8 tablet 0 Not Taking   oxycodone (OXY-IR) 5 MG capsule Take 1 capsule (5 mg total) by mouth every 4 (four) hours as needed. (Patient not taking: Reported on 05/31/2022) 40 capsule 0 Not Taking    Assessment: Patient admitted with abdominal pain. On CT scan of abdomen, shows interval splenic vein thrombosis. Not on oral anticoagulation. Pharmacy asked to start heparin  HL <0.10- subtherapeutic despite increases in heparin infusion  Goal of Therapy:  Heparin level 0.3-0.7 units/ml Monitor platelets by anticoagulation protocol: Yes   Plan:  Stop heparin infusion Start lovenox 80 mg subq every 12  hours. Continue to monotir H&H and platelets.   Margot Ables, PharmD Clinical Pharmacist 06/01/2022 5:00 PM

## 2022-06-01 NOTE — TOC Progression Note (Signed)
  Transition of Care [facility]) Screening Note   Patient Details  Name: Jillian Shepherd Date of Birth: [birth year]   Transition of [facility]) CM/SW Contact:    Boneta Lucks, RN Phone Number: 06/01/2022, 10:52 AM  GI consulted  Transition of Care Department [facility]) has reviewed patient and no TOC needs have been identified at this time. We will continue to monitor patient advancement through interdisciplinary progression rounds. If new patient transition needs arise, please place a TOC consult.   Expected Discharge Plan: Home/Self Care Barriers to Discharge: Continued Medical Work up  Expected Discharge Plan and Services Expected Discharge Plan: Home/Self Care       Living arrangements for the past 2 months: [place]

## 2022-06-01 NOTE — Consult Note (Signed)
Gastroenterology Consult   Referring Provider: No ref. provider found Primary Care Physician:  [name], MD Primary Gastroenterologist:  Dr. Marius Ditch ([facility])  Patient ID: Jillian Shepherd; [id number]; 10/15/1985   Admit date: 05/31/2022  LOS: 0 days   Date of Consultation: 06/01/2022  Reason for Consultation:  Acute on chronic parotitis with possible necrosis versus cyst  History of Present Illness   Jillian Shepherd is a 36 y.o. year old female with history of alcohol use, HTN, Liddle syndrome, B12/folate deficiency, and possible chronic pancreatitis.  Hospitalized earlier this month for acute alcoholic pancreatitis.  She was treated with pain medication and supportive measures.  She reported to the ED due to worsening epigastric and left upper quadrant abdominal pain after having steak for dinner 2 days ago.  She denied any alcohol use since her last admission.  Also with complaints of ongoing loose stools.  GI consulted for further evaluation and management of pancreatitis and pancreatic necrosis versus cyst on CT imaging as well as splenic vein thrombosis.   ED course: Mildly elevated lipase, improved from prior admission, AST elevated 67, ALT 35, AP normal, T. bili normal CT A/P with contrast: Enlarged edematous pancreas with diffuse infiltration peripancreatic tissue planes consistent with acute pancreatitis.  New area of low-attenuation within the pancreatic tail measuring 3 x 2.6 cm representing pancreatic necrosis versus developing cystic lesion and increased edema adjacent to the stomach and spleen.  No pancreatic calcification or ductal dilation.  Interval splenic vein thrombosis with remaining vascular structures patent.   Consult: Reports bad indigestion on the 24th and all day on the 25th. Took antacids that did not help her much. Passing gas from above and below. Had diarrhea on the 24 along with the heartburn. Had 4-5 loose stools, moderate amounts mostly post  prandially. Even after eating cereal, a deli sandwich, and steak (sirloin and crab legs). Does have some nausea but no vomiting. Denies melena or BRBPR. Does take NSAIDs as needed but not on a regular basis. Quit alcohol all together since her last hospitalization 4 weeks ago.  Before this she had 2 beers everyday (2 -16oz beers). Has lost about 60 ish pounds in a little over a year. Does not take a PPI at home. Has abdominal pain in the LUQ and some Left chest/shoulder pain. Denise constipation. Has not had a stool since she has been here.   Reports mom was adopted and passed in her 69s from intestinal issues. Not sure if she had pancreatitis. Reports her mom had a lot of bowel obstructions.   TCS and EGD in March 2022.  EGD normal, duodenal, gastric and esophageal biopsies taken and negative for H. pylori, celiac, dysplasia, or malignancy.  Colonoscopy with normal TI, normal rectum, 6 mm polyp in the transverse colon, an area of patchy mild erythematous mucosa in the sigmoid colon.  Transverse polyp revealed to be tubular adenoma and sigmoid with benign colonic mucosa.  Advised to have repeat colonoscopy in 6 months with 2-day prep given that she had poor prep.   Past Medical History:  Diagnosis Date   Abdominal pain    Alcohol abuse    B12 deficiency    Cancer ([place])    squmaous cell skin cancer    Collagen vascular disease (HCC)    Hypokalemia    Liddle's syndrome    Obesity (BMI 35.0-39.9 without comorbidity)    Pancreatitis     Past Surgical History:  Procedure Laterality Date   ADENOIDECTOMY  COLONOSCOPY WITH PROPOFOL N/A 10/06/2020   Procedure: COLONOSCOPY WITH PROPOFOL;  Surgeon: Lin Landsman, MD;  Location: [facility] ENDOSCOPY;  Service: Gastroenterology;  Laterality: N/A;  COVID POSITIVE 08/23/2020   ESOPHAGOGASTRODUODENOSCOPY (EGD) WITH PROPOFOL N/A 10/06/2020   Procedure: ESOPHAGOGASTRODUODENOSCOPY (EGD) WITH PROPOFOL;  Surgeon: Lin Landsman, MD;  Location: [place];  Service: Gastroenterology;  Laterality: N/A;   TONSILLECTOMY      Prior to Admission medications   Medication Sig Start Date End Date Taking? Authorizing Provider  amLODipine (NORVASC) 10 MG tablet Take 1 tablet (10 mg total) by mouth daily. 04/10/22  Yes Howard, Amber S, FNP  cyanocobalamin (VITAMIN B12) 1000 MCG/ML injection Inject 1 mL (1,000 mcg total) into the muscle every 30 (thirty) days. 05/17/22  Yes [name], MD  folic acid (FOLVITE) 1 MG tablet Take 1 tablet (1 mg total) by mouth daily. 05/17/22 05/12/23 Yes [name], MD  metFORMIN (GLUCOPHAGE) 500 MG tablet Take 1 tablet (500 mg total) by mouth 2 (two) times daily with a meal. 05/18/22  Yes Pickard, Cammie Mcgee, MD  potassium chloride SA (KLOR-CON M20) 20 MEQ tablet Take 3 tablets (60 mEq total) by mouth daily. Patient taking differently: Take 20 mEq by mouth daily. 05/21/22  Yes Mila Merry S, FNP  spironolactone (ALDACTONE) 25 MG tablet Take 1 tablet (25 mg total) by mouth daily. Patient taking differently: Take 25 mg by mouth 2 (two) times daily. 05/13/22  Yes Tat, Shanon Brow, MD  cefdinir (OMNICEF) 300 MG capsule Take 1 capsule (300 mg total) by mouth 2 (two) times daily. Patient not taking: Reported on 05/31/2022 05/13/22   Orson Eva, MD  doxycycline (VIBRA-TABS) 100 MG tablet Take 1 tablet (100 mg total) by mouth every 12 (twelve) hours. Patient not taking: Reported on 05/31/2022 05/13/22   Orson Eva, MD  oxycodone (OXY-IR) 5 MG capsule Take 1 capsule (5 mg total) by mouth every 4 (four) hours as needed. Patient not taking: Reported on 05/31/2022 05/17/22   [name], MD    Current Facility-Administered Medications  Medication Dose Route Frequency Provider Last Rate Last Admin   acetaminophen (TYLENOL) tablet 650 mg  650 mg Oral Q6H PRN Kathie Dike, MD       Or   acetaminophen (TYLENOL) suppository 650 mg  650 mg Rectal Q6H PRN Kathie Dike, MD       amLODipine (NORVASC) tablet 10 mg  10  mg Oral Daily Memon, Jolaine Artist, MD       folic acid (FOLVITE) tablet 1 mg  1 mg Oral Daily Memon, Jolaine Artist, MD       heparin ADULT infusion 100 units/mL (25000 units/229m)  1,400 Units/hr Intravenous Continuous BLaren Everts RPH 14 mL/hr at 06/01/22 0244 1,400 Units/hr at 06/01/22 0244   HYDROmorphone (DILAUDID) injection 0.5-1 mg  0.5-1 mg Intravenous Q2H PRN MKathie Dike MD   1 mg at 06/01/22 0758   influenza vac split quadrivalent PF (FLUARIX) injection 0.5 mL  0.5 mL Intramuscular Tomorrow-1000 Memon, JJolaine Artist MD       lactated ringers 1,000 mL with potassium chloride 20 mEq infusion   Intravenous Continuous MKathie Dike MD 125 mL/hr at 05/31/22 1349 New Bag at 05/31/22 1349   meclizine (ANTIVERT) tablet 25 mg  25 mg Oral TID PRN MKathie Dike MD       potassium chloride SA (KLOR-CON M) CR tablet 60 mEq  60 mEq Oral Daily Memon, JJolaine Artist MD       prochlorperazine (COMPAZINE) injection 10 mg  10  mg Intravenous Q6H PRN Kathie Dike, MD        Allergies as of 05/31/2022   (No Known Allergies)    Family History  Problem Relation Age of Onset   Melanoma Paternal Grandmother        of skin   Hypertension Father    Anxiety disorder Father    Depression Father     Social History   Socioeconomic History   Marital status: Married    Spouse name: Not on file   Number of children: Not on file   Years of education: Not on file   Highest education level: Not on file  Occupational History   Not on file  Tobacco Use   Smoking status: Every Day    Packs/day: 0.25    Years: 9.00    Total pack years: 2.25    Types: Cigarettes   Smokeless tobacco: Never  Vaping Use   Vaping Use: Never used  Substance and Sexual Activity   Alcohol use: Yes    Alcohol/week: 2.0 standard drinks of alcohol    Types: 2 Cans of beer per week   Drug use: No   Sexual activity: Yes    Birth control/protection: None  Other Topics Concern   Not on file  Social History Narrative   **  Merged History Encounter **       Social Determinants of Health   Financial Resource Strain: Not on file  Food Insecurity: No Food Insecurity (05/31/2022)   Hunger Vital Sign    Worried About Running Out of Food in the Last Year: Never true    Ran Out of Food in the Last Year: Never true  Transportation Needs: No Transportation Needs (05/31/2022)   PRAPARE - Hydrologist (Medical): No    Lack of Transportation (Non-Medical): No  Physical Activity: Not on file  Stress: Not on file  Social Connections: Not on file  Intimate Partner Violence: Not At Risk (05/31/2022)   Humiliation, Afraid, Rape, and Kick questionnaire    Fear of Current or Ex-Partner: No    Emotionally Abused: No    Physically Abused: No    Sexually Abused: No     Review of Systems   Gen: Denies any fever, chills, loss of appetite, change in weight or weight loss CV: Denies chest pain, heart palpitations, syncope, edema  Resp: Denies shortness of breath with rest, cough, wheezing, coughing up blood, and pleurisy. GI: see HPI GU : Denies urinary burning, blood in urine, urinary frequency, and urinary incontinence. MS: Denies joint pain, limitation of movement, swelling, cramps, and atrophy.  Derm: Denies rash, itching, dry skin, hives. Psych: Denies depression, anxiety, memory loss, hallucinations, and confusion. Heme: Denies bruising or bleeding Neuro:  Denies any headaches, dizziness, paresthesias, shaking  Physical Exam   Vital Signs in last 24 hours: Temp:  [97.5 F (36.4 C)-98.5 F (36.9 C)] 98.5 F (36.9 C) (10/27 0500) Pulse Rate:  [84-97] 97 (10/27 0500) Resp:  [16-26] 20 (10/27 0500) BP: (116-145)/(87-101) 140/96 (10/27 0500) SpO2:  [88 %-96 %] 88 % (10/27 0500)    General:   Alert,  Well-developed, well-nourished, pleasant and cooperative in NAD Head:  Normocephalic and atraumatic. Eyes:  Sclera clear, no icterus.   Conjunctiva pink. Ears:  Normal auditory  acuity. Mouth:  No deformity or lesions, dentition normal. Neck:  Supple; no masses Lungs:  Clear throughout to auscultation.   No wheezes, crackles, or rhonchi. No acute distress. Heart:  Regular rate  and rhythm; no murmurs, clicks, rubs,  or gallops. Abdomen:  Soft, mild distention.  Tenderness greater in the epigastric and left upper quadrant extending into the left flank.  Also with tenderness to left lower quadrant.  No masses, hepatosplenomegaly or hernias noted. Normal bowel sounds, without guarding, and without rebound.   Rectal: deferred   Msk:  Symmetrical without gross deformities. Normal posture. Extremities:  Without clubbing or edema. Neurologic:  Alert and  oriented x4. Skin:  Intact without significant lesions or rashes. Psych:  Alert and cooperative.  Tearful during exam due to pain.  Intake/Output from previous day: 10/26 0701 - 10/27 0700 In: 717.1 [I.V.:217.1; IV Piggyback:500] Out: -  Intake/Output this shift: No intake/output data recorded.   Labs/Studies   Recent Labs Recent Labs    05/31/22 0856 06/01/22 0353  WBC 10.6* 9.0  HGB 16.5* 14.2  HCT 46.7* 42.1  PLT 370 324   BMET Recent Labs    05/31/22 0856 06/01/22 0353  NA 133* 134*  K 3.9 4.3  CL 100 101  CO2 24 24  GLUCOSE 153* 122*  BUN 12 11  CREATININE 0.42* 0.44  CALCIUM 9.7 8.8*   LFT Recent Labs    05/31/22 0856 06/01/22 0353  PROT 8.9* 6.8  ALBUMIN 4.0 3.1*  AST 67* 25  ALT 35 20  ALKPHOS 77 55  BILITOT 0.7 0.8   PT/INR No results for input(s): "LABPROT", "INR" in the last 72 hours. Hepatitis Panel No results for input(s): "HEPBSAG", "HCVAB", "HEPAIGM", "HEPBIGM" in the last 72 hours. C-Diff No results for input(s): "CDIFFTOX" in the last 72 hours.  Radiology/Studies CT ABDOMEN PELVIS W CONTRAST  Result Date: 05/31/2022 CLINICAL DATA:  LEFT side abdominal pain since 0300 hours, acid reflux symptoms yesterday, progressively worsening pain moving to LEFT side of  abdomen and radiating to chest and shoulder, associated nausea and diarrhea. History collagen vascular disease, hypertension, pancreatitis, smoking EXAM: CT ABDOMEN AND PELVIS WITH CONTRAST TECHNIQUE: Multidetector CT imaging of the abdomen and pelvis was performed using the standard protocol following bolus administration of intravenous contrast. RADIATION DOSE REDUCTION: This exam was performed according to the departmental dose-optimization program which includes automated exposure control, adjustment of the mA and/or kV according to patient size and/or use of iterative reconstruction technique. CONTRAST:  196m OMNIPAQUE IOHEXOL 300 MG/ML SOLN IV. No oral contrast. COMPARISON:  05/09/2022 FINDINGS: Lower chest: Lung bases clear Hepatobiliary: Gallbladder and liver normal appearance Pancreas: Enlarged edematous pancreas with diffuse infiltration of peripancreatic tissue planes consistent with acute pancreatitis, also seen on prior exam. New area of low attenuation within pancreatic tail however, approximately 3.0 x 2.6 cm, either pancreatic necrosis versus developing cystic lesion. Increased edema adjacent to stomach and spleen. Small amount of fluid in lesser sac. No pancreatic calcifications or ductal dilatation. Spleen: Normal appearance Adrenals/Urinary Tract: Adrenal glands, kidneys, ureters, and bladder normal appearance Stomach/Bowel: Normal appendix. Stomach and bowel loops otherwise unremarkable. Vascular/Lymphatic: Interval splenic vein thrombosis. Remaining vascular structures patent. Aorta normal caliber. No adenopathy. Reproductive: Unremarkable uterus and ovaries Other: Small LEFT inguinal hernia containing fat. Small amount of free fluid in pelvis. No free air. Musculoskeletal: Unremarkable IMPRESSION: Acute pancreatitis with increased surrounding peripancreatic and LEFT upper quadrant edema New area of low attenuation within pancreatic tail approximately 3.0 x 2.6 cm, either representing  pancreatic necrosis or developing cystic lesion. Interval splenic vein thrombosis. Small LEFT inguinal hernia containing fat. Electronically Signed   By: MLavonia DanaM.D.   On: 05/31/2022 10:16  Assessment   Jillian Shepherd is a 36 y.o. year old female with history of alcohol use, HTN, Liddle syndrome, B12/folate deficiency, and possible chronic pancreatitis.  Hospitalized earlier this month for acute alcoholic pancreatitis.  She was treated with pain medication and supportive measures.  She reported to the ED due to worsening epigastric and left upper quadrant abdominal pain after having steak for dinner 2 days ago.  She denied any alcohol use since her last admission.  Also with complaints of ongoing loose stools.  GI consulted for further evaluation and management of pancreatitis and pancreatic necrosis versus cyst on CT imaging as well as splenic vein thrombosis.  Acute on chronic pancreatitis, possible necrotic fluid collection vs cyst: Hospitalized earlier this month for acute alcoholic pancreatitis.  He presented to the ED yesterday with acute worsening epigastric and left upper quadrant abdominal pain.  Repeat CT A/P with normal gallbladder and liver, enlarged edematous pancreas with diffuse infiltration of peripancreatic tissue consistent with acute pancreatitis that was seen on prior exam.  Has new area of low-attenuation within the pancreatic tail measuring 3 x 2.6 cm characterized as either pancreatic necrosis versus developing cystic lesion.  Also with increased edema adjacent to the stomach and spleen.  No pancreatic calcifications or ductal dilation.  Right upper quadrant ultrasound on previous admission with normal CBD, no cholelithiasis or cholecystitis, mild increased hepatic parenchymal echogenicity with an increased area of echogenicity in the posterior hepatic lobe unable to exclude a mass.  Recommended nonemergent MRI of the abdomen with and without contrast.  Previous work-up with  normal triglycerides and no hypercalcemia.  She has not been febrile and does not have any leukocytosis.  Hold off on empiric antibiotics at this time.  May consider repeat imaging or initiation of antibiotics if she develops any infectious signs or symptoms.  Splenic vein thrombosis: CT A/P this admission with evidence of splenic vein thrombosis with patent remaining vascular structures.  Likely complication of pancreatitis.  She has been started on a heparin drip.  She has had concerns of some left upper chest pain into the shoulder blade area.  Had chest x-ray noting small left pleural effusion and adjacent retrocardiac opacity likely reflecting atelectasis or pneumonia.  Consideration for possible PE  Loose stools: Reported a couple days of postprandial loose stools prior to admission.  Has not had any bowel movement since admission.  He is passing flatulence.  Likely secondary to acute pancreatitis. Will check pancreatic fecal elastase to see if there is need for pancreatic enzyme replacement.  Plan / Recommendations   Continue IV fluids at 125 ml/hr Continue supportive measures with pain medication and antiemetics Pancreatic fecal elastase with next stool NPO Hold on antibiotics unless she develops leukocytosis or other signs of infection Repeat pancreatic imaging in 2-3 months    06/01/2022, 8:45 AM  Venetia Night, MSN, FNP-BC, AGACNP-BC [facility]

## 2022-06-02 DIAGNOSIS — K852 Alcohol induced acute pancreatitis without necrosis or infection: Secondary | ICD-10-CM | POA: Diagnosis not present

## 2022-06-02 DIAGNOSIS — I10 Essential (primary) hypertension: Secondary | ICD-10-CM | POA: Diagnosis not present

## 2022-06-02 DIAGNOSIS — K859 Acute pancreatitis without necrosis or infection, unspecified: Secondary | ICD-10-CM | POA: Diagnosis not present

## 2022-06-02 DIAGNOSIS — I151 Hypertension secondary to other renal disorders: Secondary | ICD-10-CM | POA: Diagnosis not present

## 2022-06-02 LAB — BASIC METABOLIC PANEL
Anion gap: 7 (ref 5–15)
BUN: 10 mg/dL (ref 6–20)
CO2: 24 mmol/L (ref 22–32)
Calcium: 8.6 mg/dL — ABNORMAL LOW (ref 8.9–10.3)
Chloride: 101 mmol/L (ref 98–111)
Creatinine, Ser: 0.41 mg/dL — ABNORMAL LOW (ref 0.44–1.00)
GFR, Estimated: >60 mL/min (ref >60)
Glucose, Bld: 117 mg/dL — ABNORMAL HIGH (ref 70–99)
Potassium: 4.3 mmol/L (ref 3.5–5.1)
Sodium: 132 mmol/L — ABNORMAL LOW (ref 135–145)

## 2022-06-02 LAB — CBC
HCT: 37.2 % (ref 36.0–46.0)
Hemoglobin: 12.7 g/dL (ref 12.0–15.0)
MCH: 37 pg — ABNORMAL HIGH (ref 26.0–34.0)
MCHC: 34.1 g/dL (ref 30.0–36.0)
MCV: 108.5 fL — ABNORMAL HIGH (ref 80.0–100.0)
Platelets: 274 10*3/uL (ref 150–400)
RBC: 3.43 MIL/uL — ABNORMAL LOW (ref 3.87–5.11)
RDW: 15.1 % (ref 11.5–15.5)
WBC: 10.2 10*3/uL (ref 4.0–10.5)
nRBC: 0 % (ref 0.0–0.2)

## 2022-06-02 MED ORDER — BISACODYL 10 MG RE SUPP
10.0000 mg | Freq: Once | RECTAL | Status: AC
  Administered 2022-06-02: 10 mg via RECTAL
  Filled 2022-06-02: qty 1

## 2022-06-02 NOTE — Progress Notes (Signed)
PROGRESS NOTE    Jillian Shepherd  IRW:[id number] DOB: [birth year] DOA: 05/31/2022 PCP: [name], MD    Brief Narrative:  36 year old female with history of previous alcohol use, was admitted to the hospital earlier this month for acute alcoholic pancreatitis.  She was readmitted to the hospital on 10/26 with worsening abdominal pain.  CT showed persistent pancreatitis and splenic vein thrombosis.  Started on IV fluids, pain management and anticoagulation.  GI following.   Assessment & Plan:   Principal Problem:   Acute pancreatitis Active Problems:   Essential hypertension   Diarrhea   Splenic vein thrombosis   Liddle's syndrome   Acute on chronic pancreatitis -Patient reports that she has abstained from alcohol since her last discharge -Worsening pain may be related to splenic vein thrombosis which is likely a sequelae of pancreatitis -Treat supportively with IV fluids, pain management -Appreciate GI input -We will continue n.p.o. for now -Since she is currently afebrile and nontoxic, will hold off on antibiotics   Acute splenic vein thrombosis -Currently on therapeutic Lovenox   Hypertension -Continue on Norvasc   Liddle's syndrome -Chronically on spironolactone and potassium supplementation -We will hold off on Aldactone for now and replace potassium as needed   Loose stools -GI following -Check pancreatic elastase with next bowel movement   DVT prophylaxis: lovenox  Code Status: full code Family Communication: discussed with patient Disposition Plan: Status is: Inpatient Remains inpatient appropriate because: continued pain management for pancreatitis      Consultants:  Gastroenterology  Procedures:    Antimicrobials:      Subjective: Had some nausea and vomiting last night.  Still has some nausea today.  Overall pain is better controlled today.  Objective: Vitals:   06/01/22 1351 06/01/22 2126 06/02/22 0608 06/02/22 1406  BP: (!)  137/97 126/83 112/82 127/81  Pulse: (!) 108 94 86 94  Resp: '20 18 16 16  '$ Temp: 98.1 F (36.7 C) 98.5 F (36.9 C) 98.4 F (36.9 C) 97.8 F (36.6 C)  TempSrc: Oral   Oral  SpO2: 90% 91% 93% 92%  Weight:      Height:        Intake/Output Summary (Last 24 hours) at 06/02/2022 1908 Last data filed at 06/02/2022 1500 Gross per 24 hour  Intake 2887.02 ml  Output --  Net 2887.02 ml   Filed Weights   05/31/22 0824  Weight: 83.6 kg    Examination:  General exam: Appears calm and comfortable  Respiratory system: Clear to auscultation. Respiratory effort normal. Cardiovascular system: S1 & S2 heard, RRR. No JVD, murmurs, rubs, gallops or clicks. No pedal edema. Gastrointestinal system: Abdomen is nondistended, soft and tender in epigastrium and left upper quadrant. No organomegaly or masses felt. Normal bowel sounds heard. Central nervous system: Alert and oriented. No focal neurological deficits. Extremities: Symmetric 5 x 5 power. Skin: No rashes, lesions or ulcers Psychiatry: Judgement and insight appear normal. Mood & affect appropriate.     Data Reviewed: I have personally reviewed following labs and imaging studies  CBC: Recent Labs  Lab 05/31/22 0856 06/01/22 0353 06/02/22 0441  WBC 10.6* 9.0 10.2  NEUTROABS 8.2*  --   --   HGB 16.5* 14.2 12.7  HCT 46.7* 42.1 37.2  MCV 105.7* 108.5* 108.5*  PLT 370 324 761   Basic Metabolic Panel: Recent Labs  Lab 05/31/22 0856 06/01/22 0353 06/02/22 0441  NA 133* 134* 132*  K 3.9 4.3 4.3  CL 100 101 101  CO2 24 24 24  GLUCOSE 153* 122* 117*  BUN '12 11 10  '$ CREATININE 0.42* 0.44 0.41*  CALCIUM 9.7 8.8* 8.6*   GFR: Estimated Creatinine Clearance: 98.6 mL/min (A) (by C-G formula based on SCr of 0.41 mg/dL (L)). Liver Function Tests: Recent Labs  Lab 05/31/22 0856 06/01/22 0353  AST 67* 25  ALT 35 20  ALKPHOS 77 55  BILITOT 0.7 0.8  PROT 8.9* 6.8  ALBUMIN 4.0 3.1*   Recent Labs  Lab 05/31/22 0856  LIPASE  193*   No results for input(s): "AMMONIA" in the last 168 hours. Coagulation Profile: No results for input(s): "INR", "PROTIME" in the last 168 hours. Cardiac Enzymes: No results for input(s): "CKTOTAL", "CKMB", "CKMBINDEX", "TROPONINI" in the last 168 hours. BNP (last 3 results) No results for input(s): "PROBNP" in the last 8760 hours. HbA1C: No results for input(s): "HGBA1C" in the last 72 hours. CBG: No results for input(s): "GLUCAP" in the last 168 hours. Lipid Profile: No results for input(s): "CHOL", "HDL", "LDLCALC", "TRIG", "CHOLHDL", "LDLDIRECT" in the last 72 hours. Thyroid Function Tests: No results for input(s): "TSH", "T4TOTAL", "FREET4", "T3FREE", "THYROIDAB" in the last 72 hours. Anemia Panel: No results for input(s): "VITAMINB12", "FOLATE", "FERRITIN", "TIBC", "IRON", "RETICCTPCT" in the last 72 hours. Sepsis Labs: No results for input(s): "PROCALCITON", "LATICACIDVEN" in the last 168 hours.  No results found for this or any previous visit (from the past 240 hour(s)).       Radiology Studies: DG CHEST PORT 1 VIEW  Result Date: 06/01/2022 CLINICAL DATA:  Chest pain EXAM: PORTABLE CHEST 1 VIEW COMPARISON:  Chest radiograph 05/09/2022, CTA chest 05/11/2022 FINDINGS: The cardiomediastinal silhouette is stable. Lung volumes are low. There is small left pleural effusion with adjacent retrocardiac opacity which may reflect atelectasis or pneumonia, likely similar to the CTA from 05/11/2022 but new since the radiograph from 05/09/2022. The left upper lobe is well-aerated. The right lung is clear. There is no right pleural effusion. There is no acute osseous abnormality. IMPRESSION: Small left pleural effusion and adjacent retrocardiac opacity which may reflect atelectasis or pneumonia, likely similar to the CTA chest from 05/11/2022 but new since 05/09/2022. Electronically Signed   By: Valetta Mole M.D.   On: 06/01/2022 12:56        Scheduled Meds:  amLODipine  10 mg  Oral Daily   bisacodyl  10 mg Rectal Once   enoxaparin (LOVENOX) injection  80 mg Subcutaneous O27O   folic acid  1 mg Oral Daily   influenza vac split quadrivalent PF  0.5 mL Intramuscular Tomorrow-1000   potassium chloride SA  60 mEq Oral Daily   Continuous Infusions:  lactated ringers 1,000 mL with potassium chloride 20 mEq infusion 75 mL/hr at 06/02/22 1810     LOS: 1 day    Time spent: 32mns    JKathie Dike MD [facility]   If 7PM-7AM, please contact night-coverage [email or web address]  06/02/2022, 7:08 PM

## 2022-06-02 NOTE — Progress Notes (Signed)
Patient continues to have significant periumbilical abdominal pain. Denies vomiting.  Positive flatus but no BM  Temp:  [97.8 F (36.6 C)-98.5 F (36.9 C)] 97.8 F (36.6 C) (10/28 1406) Pulse Rate:  [86-94] 94 (10/28 1406) Resp:  [16-18] 16 (10/28 1406) BP: (112-127)/(81-83) 127/81 (10/28 1406) SpO2:  [91 %-93 %] 92 % (10/28 1406) Last BM Date : 05/31/22 General:   Alert,   pleasant and cooperative; does not appear toxic.  Abdomen: Nondistended bowel sounds are present she does have ongoing periumbilical abdominal tenderness to palpation no appreciable mass. Extremities:  Without clubbing or edema.    Intake/Output from previous day: 10/27 0701 - 10/28 0700 In: 2663.9 [I.V.:2663.9] Out: -  Intake/Output this shift: No intake/output data recorded.  Lab Results: Recent Labs    05/31/22 0856 06/01/22 0353 06/02/22 0441  WBC 10.6* 9.0 10.2  HGB 16.5* 14.2 12.7  HCT 46.7* 42.1 37.2  PLT 370 324 274   BMET Recent Labs    05/31/22 0856 06/01/22 0353 06/02/22 0441  NA 133* 134* 132*  K 3.9 4.3 4.3  CL 100 101 101  CO2 '24 24 24  '$ GLUCOSE 153* 122* 117*  BUN '12 11 10  '$ CREATININE 0.42* 0.44 0.41*  CALCIUM 9.7 8.8* 8.6*   LFT Recent Labs    06/01/22 0353  PROT 6.8  ALBUMIN 3.1*  AST 25  ALT 20  ALKPHOS 55  BILITOT 0.8   PT/INR No results for input(s): "LABPROT", "INR" in the last 72 hours. Hepatitis Panel No results for input(s): "HEPBSAG", "HCVAB", "HEPAIGM", "HEPBIGM" in the last 72 hours. C-Diff No results for input(s): "CDIFFTOX" in the last 72 hours.  Studies/Results: DG CHEST PORT 1 VIEW  Result Date: 06/01/2022 CLINICAL DATA:  Chest pain EXAM: PORTABLE CHEST 1 VIEW COMPARISON:  Chest radiograph 05/09/2022, CTA chest 05/11/2022 FINDINGS: The cardiomediastinal silhouette is stable. Lung volumes are low. There is small left pleural effusion with adjacent retrocardiac opacity which may reflect atelectasis or pneumonia, likely similar to the CTA from  05/11/2022 but new since the radiograph from 05/09/2022. The left upper lobe is well-aerated. The right lung is clear. There is no right pleural effusion. There is no acute osseous abnormality. IMPRESSION: Small left pleural effusion and adjacent retrocardiac opacity which may reflect atelectasis or pneumonia, likely similar to the CTA chest from 05/11/2022 but new since 05/09/2022. Electronically Signed   By: Valetta Mole M.D.   On: 06/01/2022 12:56    Impression: 36 year old lady with alcohol use disorder admitted to the hospital with recurrent pancreatitis in all likelihood alcohol related.  At this point in time, she has symptomatic acute pancreatitis complicated by.  Splenic vein thrombosis.  3 cm lesion in the tail of the pancreas could be a pseudocyst or early necrotizing component. At any rate, she does not have multiorgan failure or evidence of infection.  Recommendations:   Largely supportive at this point in time.  Anticoagulation for splenic vein thrombosis  Continue n.p.o.  Alcohol abstinence moving forward will be very important.  No need to reimage at this time so long as clinical course one of stability and improvement.

## 2022-06-02 NOTE — Progress Notes (Signed)
Pain managed to pt satisfaction with alternating percocet and hydromorphone.  No nausea.  Active bowel sounds.  No diarrhea.  Pt ambulatory in room.  Continue to monitor.

## 2022-06-03 DIAGNOSIS — K8581 Other acute pancreatitis with uninfected necrosis: Secondary | ICD-10-CM

## 2022-06-03 DIAGNOSIS — K852 Alcohol induced acute pancreatitis without necrosis or infection: Secondary | ICD-10-CM | POA: Diagnosis not present

## 2022-06-03 LAB — CBC
HCT: 37 % (ref 36.0–46.0)
Hemoglobin: 12.6 g/dL (ref 12.0–15.0)
MCH: 36.4 pg — ABNORMAL HIGH (ref 26.0–34.0)
MCHC: 34.1 g/dL (ref 30.0–36.0)
MCV: 106.9 fL — ABNORMAL HIGH (ref 80.0–100.0)
Platelets: 297 10*3/uL (ref 150–400)
RBC: 3.46 MIL/uL — ABNORMAL LOW (ref 3.87–5.11)
RDW: 15.3 % (ref 11.5–15.5)
WBC: 9.2 10*3/uL (ref 4.0–10.5)
nRBC: 0 % (ref 0.0–0.2)

## 2022-06-03 MED ORDER — HYDROMORPHONE HCL 1 MG/ML IJ SOLN
0.5000 mg | Freq: Four times a day (QID) | INTRAMUSCULAR | Status: DC | PRN
  Administered 2022-06-03: 0.5 mg via INTRAVENOUS
  Administered 2022-06-04 – 2022-06-06 (×9): 1 mg via INTRAVENOUS
  Filled 2022-06-03 (×11): qty 1

## 2022-06-03 MED ORDER — ORAL CARE MOUTH RINSE: 15.0000 mL | OROMUCOSAL | Status: DC | PRN

## 2022-06-03 NOTE — Progress Notes (Signed)
Patient wants food.  States she is hungry.  Also states pain persisting at 8-9/10. Discussed with nursing staff.  Patient has received 3 doses of Percocet and Dilaudid since last evening.  Asked for pain meds on a regular basis. 1 BM overnight.  She is ambulating in the hallway.  Vital signs in last 24 hours: Temp:  [97.8 F (36.6 C)-98.3 F (36.8 C)] 98.3 F (36.8 C) (10/29 0423) Pulse Rate:  [85-96] 85 (10/29 0423) Resp:  [16-19] 18 (10/29 0423) BP: (110-127)/(75-81) 110/75 (10/29 0423) SpO2:  [91 %-92 %] 92 % (10/29 0423) Last BM Date : 05/31/22 General:   Alert,  , pleasant and cooperative in NAD Abdomen: Nondistended.  Positive bowel sounds.  She does have moderate epigastric/periumbilical tenderness to palpation.   Extremities:  Without clubbing or edema.    Intake/Output from previous day: 10/28 0701 - 10/29 0700 In: 2365.2 [I.V.:2365.2] Out: -  Intake/Output this shift: No intake/output data recorded.  Lab Results: Recent Labs    06/01/22 0353 06/02/22 0441 06/03/22 0539  WBC 9.0 10.2 9.2  HGB 14.2 12.7 12.6  HCT 42.1 37.2 37.0  PLT 324 274 297   BMET Recent Labs    06/01/22 0353 06/02/22 0441  NA 134* 132*  K 4.3 4.3  CL 101 101  CO2 24 24  GLUCOSE 122* 117*  BUN 11 10  CREATININE 0.44 0.41*  CALCIUM 8.8* 8.6*   LFT Recent Labs    06/01/22 0353  PROT 6.8  ALBUMIN 3.1*  AST 25  ALT 20  ALKPHOS 59  BILITOT 0.8    Impression: 36 year old lady with alcohol use disorder admitted to hospital with recurrent pancreatitis  -  likely alcohol induced.  Possible early pseudocyst versus a small necrotizing component on cross-sectional imaging.  Splenic vein thrombosis-now anticoagulated with heparin. Clinical course thus far appears to be uncomplicated.  She does not have multiorgan failure. She appears better to me today than she did yesterday.  She is not febrile and not tachycardic.  However, she continues to receive oral and IV pain medications on a  regular basis.  Recommendations:  Continue supportive care.  Keep n.p.o. another 24 hours  Once analgesic requirements have diminished, we can consider advancing diet.  Discussed with patient.  Heparin for splenic vein thrombosis  We will reassess tomorrow morning.

## 2022-06-03 NOTE — Progress Notes (Signed)
PROGRESS NOTE    Jillian Shepherd  PRF:[id number] DOB: [birth year] DOA: 05/31/2022 PCP: [name], MD    Brief Narrative:  36 year old female with history of previous alcohol use, was admitted to the hospital earlier this month for acute alcoholic pancreatitis.  She was readmitted to the hospital on 10/26 with worsening abdominal pain.  CT showed persistent pancreatitis and splenic vein thrombosis.  Started on IV fluids, pain management and anticoagulation.  GI following.   Assessment & Plan:   Principal Problem:   Acute pancreatitis Active Problems:   Essential hypertension   Diarrhea   Splenic vein thrombosis   Liddle's syndrome   Acute on chronic pancreatitis -Patient reports that she has abstained from alcohol since her last discharge -Worsening pain may be related to splenic vein thrombosis which is likely a sequelae of pancreatitis -Treat supportively with IV fluids, pain management -Appreciate GI input -Continue n.p.o. for another 24 hours -Since she is feeling some better, will start to decrease frequency of IV pain medicine -Since she is currently afebrile and nontoxic, will hold off on antibiotics   Acute splenic vein thrombosis -Currently on therapeutic Lovenox   Hypertension -Continue on Norvasc   Liddle's syndrome -Chronically on spironolactone and potassium supplementation -We will hold off on Aldactone for now and replace potassium as needed   Loose stools -GI following -Check pancreatic elastase with next bowel movement   DVT prophylaxis: lovenox  Code Status: full code Family Communication: discussed with patient Disposition Plan: Status is: Inpatient Remains inpatient appropriate because: continued pain management for pancreatitis      Consultants:  Gastroenterology  Procedures:    Antimicrobials:      Subjective: Still has some nausea, but no further vomiting.  Had a small bowel movement yesterday which she felt relieved  some pressure in her abdomen.  She feels that her pain is doing better today, although still present  Objective: Vitals:   06/02/22 1406 06/02/22 2012 06/03/22 0423 06/03/22 1555  BP: 127/81 114/80 110/75 131/89  Pulse: 94 96 85 93  Resp: '16 19 18 18  '$ Temp: 97.8 F (36.6 C) 97.8 F (36.6 C) 98.3 F (36.8 C) 98 F (36.7 C)  TempSrc: Oral     SpO2: 92% 91% 92% 96%  Weight:      Height:        Intake/Output Summary (Last 24 hours) at 06/03/2022 1659 Last data filed at 06/03/2022 1500 Gross per 24 hour  Intake 1940.96 ml  Output --  Net 1940.96 ml   Filed Weights   05/31/22 0824  Weight: 83.6 kg    Examination:  General exam: Appears calm and comfortable  Respiratory system: Clear to auscultation. Respiratory effort normal. Cardiovascular system: S1 & S2 heard, RRR. No JVD, murmurs, rubs, gallops or clicks. No pedal edema. Gastrointestinal system: Abdomen is nondistended, soft and tender in epigastrium and left upper quadrant. No organomegaly or masses felt. Normal bowel sounds heard. Central nervous system: Alert and oriented. No focal neurological deficits. Extremities: Symmetric 5 x 5 power. Skin: No rashes, lesions or ulcers Psychiatry: Judgement and insight appear normal. Mood & affect appropriate.     Data Reviewed: I have personally reviewed following labs and imaging studies  CBC: Recent Labs  Lab 05/31/22 0856 06/01/22 0353 06/02/22 0441 06/03/22 0539  WBC 10.6* 9.0 10.2 9.2  NEUTROABS 8.2*  --   --   --   HGB 16.5* 14.2 12.7 12.6  HCT 46.7* 42.1 37.2 37.0  MCV 105.7* 108.5* 108.5* 106.9*  PLT 370 324 274 056   Basic Metabolic Panel: Recent Labs  Lab 05/31/22 0856 06/01/22 0353 06/02/22 0441  NA 133* 134* 132*  K 3.9 4.3 4.3  CL 100 101 101  CO2 '24 24 24  '$ GLUCOSE 153* 122* 117*  BUN '12 11 10  '$ CREATININE 0.42* 0.44 0.41*  CALCIUM 9.7 8.8* 8.6*   GFR: Estimated Creatinine Clearance: 98.6 mL/min (A) (by C-G formula based on SCr of 0.41  mg/dL (L)). Liver Function Tests: Recent Labs  Lab 05/31/22 0856 06/01/22 0353  AST 67* 25  ALT 35 20  ALKPHOS 77 55  BILITOT 0.7 0.8  PROT 8.9* 6.8  ALBUMIN 4.0 3.1*   Recent Labs  Lab 05/31/22 0856  LIPASE 193*   No results for input(s): "AMMONIA" in the last 168 hours. Coagulation Profile: No results for input(s): "INR", "PROTIME" in the last 168 hours. Cardiac Enzymes: No results for input(s): "CKTOTAL", "CKMB", "CKMBINDEX", "TROPONINI" in the last 168 hours. BNP (last 3 results) No results for input(s): "PROBNP" in the last 8760 hours. HbA1C: No results for input(s): "HGBA1C" in the last 72 hours. CBG: No results for input(s): "GLUCAP" in the last 168 hours. Lipid Profile: No results for input(s): "CHOL", "HDL", "LDLCALC", "TRIG", "CHOLHDL", "LDLDIRECT" in the last 72 hours. Thyroid Function Tests: No results for input(s): "TSH", "T4TOTAL", "FREET4", "T3FREE", "THYROIDAB" in the last 72 hours. Anemia Panel: No results for input(s): "VITAMINB12", "FOLATE", "FERRITIN", "TIBC", "IRON", "RETICCTPCT" in the last 72 hours. Sepsis Labs: No results for input(s): "PROCALCITON", "LATICACIDVEN" in the last 168 hours.  No results found for this or any previous visit (from the past 240 hour(s)).       Radiology Studies: No results found.      Scheduled Meds:  amLODipine  10 mg Oral Daily   enoxaparin (LOVENOX) injection  80 mg Subcutaneous [id number]   folic acid  1 mg Oral Daily   influenza vac split quadrivalent PF  0.5 mL Intramuscular Tomorrow-1000   potassium chloride SA  60 mEq Oral Daily   Continuous Infusions:  lactated ringers 1,000 mL with potassium chloride 20 mEq infusion 75 mL/hr at 06/03/22 0503     LOS: 2 days    Time spent: 29mns    JKathie Dike MD [facility]   If 7PM-7AM, please contact night-coverage [email or web address]  06/03/2022, 4:59 PM

## 2022-06-04 DIAGNOSIS — K852 Alcohol induced acute pancreatitis without necrosis or infection: Secondary | ICD-10-CM | POA: Diagnosis not present

## 2022-06-04 LAB — CBC
HCT: 36.9 % (ref 36.0–46.0)
Hemoglobin: 12.8 g/dL (ref 12.0–15.0)
MCH: 36.7 pg — ABNORMAL HIGH (ref 26.0–34.0)
MCHC: 34.7 g/dL (ref 30.0–36.0)
MCV: 105.7 fL — ABNORMAL HIGH (ref 80.0–100.0)
Platelets: 330 10*3/uL (ref 150–400)
RBC: 3.49 MIL/uL — ABNORMAL LOW (ref 3.87–5.11)
RDW: 15.4 % (ref 11.5–15.5)
WBC: 8.7 10*3/uL (ref 4.0–10.5)
nRBC: 0 % (ref 0.0–0.2)

## 2022-06-04 NOTE — Progress Notes (Signed)
Pt's pain was 8-10 most of the night. She reports pain is no different that before. She did require PRN dilaudid twice during shift and Percocet once. NO acute events overnight. Bryson Corona Edd Fabian

## 2022-06-04 NOTE — Progress Notes (Signed)
Gastroenterology Progress Note   Referring Provider: No ref. provider found Primary Care Physician:  [name], MD Primary Gastroenterologist:  Dr.  Patient ID: Jillian Shepherd; [id number]; April 02, 1986    Subjective   Abdominal pain improved. Will radiate to shoulder when stomach "growls". Mild nausea, no vomiting. Had BM this morning. Wants to try clear liquids.    Objective   Vital signs in last 24 hours Temp:  [98 F (36.7 C)-98.6 F (37 C)] 98.6 F (37 C) (10/30 0418) Pulse Rate:  [85-101] 85 (10/30 0418) Resp:  [14-18] 14 (10/30 0418) BP: (116-132)/(84-89) 116/85 (10/30 0418) SpO2:  [94 %-96 %] 94 % (10/30 0418) Last BM Date : 06/02/22  Physical Exam General:   Alert and oriented, pleasant Head:  Normocephalic and atraumatic. Eyes:  No icterus, sclera clear. Conjuctiva pink.  Abdomen:  Bowel sounds present, soft, mild TTP epigastric and LUQ without rebound or guarding Neurologic:  Alert and  oriented x4   Intake/Output from previous day: 10/29 0701 - 10/30 0700 In: 1920.6 [P.O.:240; I.V.:1680.6] Out: -  Intake/Output this shift: No intake/output data recorded.  Lab Results  Recent Labs    06/02/22 0441 06/03/22 0539 06/04/22 0533  WBC 10.2 9.2 8.7  HGB 12.7 12.6 12.8  HCT 37.2 37.0 36.9  PLT 274 297 330   BMET Recent Labs    06/02/22 0441  NA 132*  K 4.3  CL 101  CO2 24  GLUCOSE 117*  BUN 10  CREATININE 0.41*  CALCIUM 8.6*   Lab Results  Component Value Date   ALT 20 06/01/2022   AST 25 06/01/2022   ALKPHOS 55 06/01/2022   BILITOT 0.8 06/01/2022    Studies/Results DG CHEST PORT 1 VIEW  Result Date: 06/01/2022 CLINICAL DATA:  Chest pain EXAM: PORTABLE CHEST 1 VIEW COMPARISON:  Chest radiograph 05/09/2022, CTA chest 05/11/2022 FINDINGS: The cardiomediastinal silhouette is stable. Lung volumes are low. There is small left pleural effusion with adjacent retrocardiac opacity which may reflect atelectasis or pneumonia, likely  similar to the CTA from 05/11/2022 but new since the radiograph from 05/09/2022. The left upper lobe is well-aerated. The right lung is clear. There is no right pleural effusion. There is no acute osseous abnormality. IMPRESSION: Small left pleural effusion and adjacent retrocardiac opacity which may reflect atelectasis or pneumonia, likely similar to the CTA chest from 05/11/2022 but new since 05/09/2022. Electronically Signed   By: Valetta Mole M.D.   On: 06/01/2022 12:56   CT ABDOMEN PELVIS W CONTRAST  Result Date: 05/31/2022 CLINICAL DATA:  LEFT side abdominal pain since 0300 hours, acid reflux symptoms yesterday, progressively worsening pain moving to LEFT side of abdomen and radiating to chest and shoulder, associated nausea and diarrhea. History collagen vascular disease, hypertension, pancreatitis, smoking EXAM: CT ABDOMEN AND PELVIS WITH CONTRAST TECHNIQUE: Multidetector CT imaging of the abdomen and pelvis was performed using the standard protocol following bolus administration of intravenous contrast. RADIATION DOSE REDUCTION: This exam was performed according to the departmental dose-optimization program which includes automated exposure control, adjustment of the mA and/or kV according to patient size and/or use of iterative reconstruction technique. CONTRAST:  157m OMNIPAQUE IOHEXOL 300 MG/ML SOLN IV. No oral contrast. COMPARISON:  05/09/2022 FINDINGS: Lower chest: Lung bases clear Hepatobiliary: Gallbladder and liver normal appearance Pancreas: Enlarged edematous pancreas with diffuse infiltration of peripancreatic tissue planes consistent with acute pancreatitis, also seen on prior exam. New area of low attenuation within pancreatic tail however, approximately 3.0 x 2.6 cm, either pancreatic necrosis  versus developing cystic lesion. Increased edema adjacent to stomach and spleen. Small amount of fluid in lesser sac. No pancreatic calcifications or ductal dilatation. Spleen: Normal appearance  Adrenals/Urinary Tract: Adrenal glands, kidneys, ureters, and bladder normal appearance Stomach/Bowel: Normal appendix. Stomach and bowel loops otherwise unremarkable. Vascular/Lymphatic: Interval splenic vein thrombosis. Remaining vascular structures patent. Aorta normal caliber. No adenopathy. Reproductive: Unremarkable uterus and ovaries Other: Small LEFT inguinal hernia containing fat. Small amount of free fluid in pelvis. No free air. Musculoskeletal: Unremarkable IMPRESSION: Acute pancreatitis with increased surrounding peripancreatic and LEFT upper quadrant edema New area of low attenuation within pancreatic tail approximately 3.0 x 2.6 cm, either representing pancreatic necrosis or developing cystic lesion. Interval splenic vein thrombosis. Small LEFT inguinal hernia containing fat. Electronically Signed   By: Lavonia Dana M.D.   On: 05/31/2022 10:16   CT Angio Chest Pulmonary Embolism (PE) W or WO Contrast  Result Date: 05/11/2022 CLINICAL DATA:  Positive D-dimer, hypoxia.  Shortness of breath. EXAM: CT ANGIOGRAPHY CHEST WITH CONTRAST TECHNIQUE: Multidetector CT imaging of the chest was performed using the standard protocol during bolus administration of intravenous contrast. Multiplanar CT image reconstructions and MIPs were obtained to evaluate the vascular anatomy. RADIATION DOSE REDUCTION: This exam was performed according to the departmental dose-optimization program which includes automated exposure control, adjustment of the mA and/or kV according to patient size and/or use of iterative reconstruction technique. CONTRAST:  65m OMNIPAQUE IOHEXOL 350 MG/ML SOLN COMPARISON:  None Available. FINDINGS: Cardiovascular: Satisfactory opacification of the pulmonary arteries to the segmental level. No evidence of pulmonary embolism. Normal heart size. No pericardial effusion. Mediastinum/Nodes: No enlarged mediastinal, hilar, or axillary lymph nodes. Thyroid gland, trachea, and esophagus demonstrate no  significant findings. Lungs/Pleura: Moderate left and small right pleural effusions are present. There are patchy multifocal ground-glass opacities in the upper lobes, right greater than left. There is compressive atelectasis in the lingula and left lower lobe. No pneumothorax. Trachea and central airways are patent. Upper Abdomen: There is trace free fluid in the right upper quadrant. Musculoskeletal: No chest wall abnormality. No acute or significant osseous findings. Review of the MIP images confirms the above findings. IMPRESSION: 1. No evidence for pulmonary embolism. 2. Moderate left and small right pleural effusions. 3. Patchy multifocal ground-glass opacities in the upper lobes, right greater than left. Findings may be infectious/inflammatory or related to pulmonary edema. 4. Trace free fluid in the right upper quadrant of the abdomen. Electronically Signed   By: ARonney AstersM.D.   On: 05/11/2022 21:12   ECHOCARDIOGRAM COMPLETE  Result Date: 05/10/2022    ECHOCARDIOGRAM REPORT   Patient Name:   NMARIELOUISE AMEYDate of Exam: 05/10/2022 Medical Rec #:  [id number]      Height:       63.0 in Accession #:    [id number]     Weight:       190.3 lb Date of Birth:  2Mar 09, 1987      BSA:          1.893 m Patient Age:    89years        BP:           166/104 mmHg Patient Gender: F               HR:           117 bpm. [facility]  [facility]: 2D Echo, Cardiac Doppler and Color Doppler Indications:    Syncope  History:  Patient has no prior history of Echocardiogram examinations.                 Signs/Symptoms:Syncope; Risk Factors:Hypertension. ETOH abuse.  Sonographer:    Wenda Low Referring Phys: [id number] OLADAPO ADEFESO IMPRESSIONS  1. Difficult acoustic windows Endocardium is difficult to see in apical views. Possible basal inferior hypokinesis. Would recommend limited echo with Definity to confirm wall motion. Left ventricular ejection fraction, by estimation, is 60 to 65%. The left  ventricle has normal function. There is mild left ventricular hypertrophy. Indeterminate diastolic filling due to E-A fusion.  2. Right ventricular systolic function is normal. The right ventricular size is normal.  3. The mitral valve is normal in structure. Trivial mitral valve regurgitation.  4. The aortic valve is normal in structure. Aortic valve regurgitation is not visualized.  5. The inferior vena cava is normal in size with greater than 50% respiratory variability, suggesting right atrial pressure of 3 mmHg. FINDINGS  Left Ventricle: Difficult acoustic windows Endocardium is difficult to see in apical views. Possible basal inferior hypokinesis. Would recommend limited echo with Definity to confirm wall motion. Left ventricular ejection fraction, by estimation, is 60 to 65%. The left ventricle has normal function. The left ventricular internal cavity size was normal in size. There is mild left ventricular hypertrophy. Indeterminate diastolic filling due to E-A fusion. Right Ventricle: The right ventricular size is normal. Right vetricular wall thickness was not assessed. Right ventricular systolic function is normal. Left Atrium: Left atrial size was normal in size. Right Atrium: Right atrial size was normal in size. Pericardium: There is no evidence of pericardial effusion. Mitral Valve: The mitral valve is normal in structure. Trivial mitral valve regurgitation. MV peak gradient, 5.9 mmHg. The mean mitral valve gradient is 2.0 mmHg. Tricuspid Valve: The tricuspid valve is normal in structure. Tricuspid valve regurgitation is trivial. Aortic Valve: The aortic valve is normal in structure. Aortic valve regurgitation is not visualized. Aortic valve mean gradient measures 4.0 mmHg. Aortic valve peak gradient measures 7.7 mmHg. Aortic valve area, by VTI measures 2.85 cm. Pulmonic Valve: The pulmonic valve was normal in structure. Pulmonic valve regurgitation is trivial. Aorta: The aortic root is normal in size  and structure. Venous: The inferior vena cava is normal in size with greater than 50% respiratory variability, suggesting right atrial pressure of 3 mmHg. IAS/Shunts: No atrial level shunt detected by color flow Doppler.  LEFT VENTRICLE PLAX 2D LVIDd:         4.80 cm   Diastology LVIDs:         2.90 cm   LV e' medial:    7.40 cm/s LV PW:         1.10 cm   LV E/e' medial:  7.9 LV IVS:        1.20 cm   LV e' lateral:   8.59 cm/s LVOT diam:     2.00 cm   LV E/e' lateral: 6.8 LV SV:         66 LV SV Index:   35 LVOT Area:     3.14 cm  RIGHT VENTRICLE RV Basal diam:  2.80 cm RV Mid diam:    2.50 cm RV S prime:     20.50 cm/s TAPSE (M-mode): 2.7 cm LEFT ATRIUM             Index        RIGHT ATRIUM           Index LA diam:  3.50 cm 1.85 cm/m   RA Area:     13.40 cm LA Vol (A2C):   26.1 ml 13.79 ml/m  RA Volume:   31.40 ml  16.59 ml/m LA Vol (A4C):   30.4 ml 16.06 ml/m LA Biplane Vol: 29.2 ml 15.42 ml/m  AORTIC VALVE                    PULMONIC VALVE AV Area (Vmax):    2.49 cm     PV Vmax:       1.10 m/s AV Area (Vmean):   2.34 cm     PV Peak grad:  4.8 mmHg AV Area (VTI):     2.85 cm AV Vmax:           139.00 cm/s AV Vmean:          95.100 cm/s AV VTI:            0.230 m AV Peak Grad:      7.7 mmHg AV Mean Grad:      4.0 mmHg LVOT Vmax:         110.00 cm/s LVOT Vmean:        70.700 cm/s LVOT VTI:          0.209 m LVOT/AV VTI ratio: 0.91  AORTA Ao Root diam: 3.20 cm MITRAL VALVE MV Area (PHT): 7.82 cm     SHUNTS MV Area VTI:   3.95 cm     Systemic VTI:  0.21 m MV Peak grad:  5.9 mmHg     Systemic Diam: 2.00 cm MV Mean grad:  2.0 mmHg MV Vmax:       1.21 m/s MV Vmean:      66.3 cm/s MV Decel Time: 97 msec MV E velocity: 58.70 cm/s MV A velocity: 102.00 cm/s MV E/A ratio:  0.58 Dorris Carnes MD Electronically signed by Dorris Carnes MD Signature Date/Time: 05/10/2022/5:02:45 PM    Final    US Carotid Bilateral  Result Date: 05/10/2022 CLINICAL DATA:  Syncopal episode. History of hypertension and smoking. EXAM:  BILATERAL CAROTID DUPLEX ULTRASOUND TECHNIQUE: Pearline Cables scale imaging, color Doppler and duplex ultrasound were performed of bilateral carotid and vertebral arteries in the neck. COMPARISON:  None Available. FINDINGS: Criteria: Quantification of carotid stenosis is based on velocity parameters that correlate the residual internal carotid diameter with NASCET-based stenosis levels, using the diameter of the distal internal carotid lumen as the denominator for stenosis measurement. The following velocity measurements were obtained: RIGHT ICA: 103/42 cm/sec CCA: 161/09 cm/sec SYSTOLIC ICA/CCA RATIO:  0.9 ECA: 135 cm/sec LEFT ICA: 90/32 cm/sec CCA: 604/54 cm/sec SYSTOLIC ICA/CCA RATIO:  0.8 ECA: 124 cm/sec RIGHT CAROTID ARTERY: There is no grayscale evidence of significant intimal thickening or atherosclerotic plaque affecting the interrogated portions of the right carotid system. There are no elevated peak systolic velocities within the interrogated course of the right internal carotid artery to suggest a hemodynamically significant stenosis. RIGHT VERTEBRAL ARTERY:  Antegrade flow LEFT CAROTID ARTERY: There is no grayscale evidence of significant intimal thickening or atherosclerotic plaque affecting the interrogated portions of the left carotid system. There are no elevated peak systolic velocities within the interrogated course of the left internal carotid artery to suggest a hemodynamically significant stenosis. LEFT VERTEBRAL ARTERY:  Antegrade flow IMPRESSION: Unremarkable carotid Doppler ultrasound. Electronically Signed   By: Sandi Mariscal M.D.   On: 05/10/2022 13:23   US Abdomen Limited RUQ (LIVER/GB)  Result Date: 05/10/2022 CLINICAL DATA:  Pancreatitis EXAM: ULTRASOUND ABDOMEN  LIMITED RIGHT UPPER QUADRANT COMPARISON:  CT 05/09/2022 FINDINGS: Gallbladder: No gallstones or wall thickening visualized. No sonographic Murphy sign noted by sonographer. Common bile duct: Diameter: 3 mm. Liver: Mildly increased  hepatic parenchymal echogenicity. There is an ovoid area within the posterior left hepatic lobe of relatively increased parenchymal echogenicity measuring 3.1 x 2.3 x 3.3 cm, which does demonstrate vascularity on color Doppler. Portal vein is patent on color Doppler imaging with normal direction of blood flow towards the liver. Other: None. IMPRESSION: 1. The liver is mildly increased in echogenicity. This is a nonspecific finding but is most commonly seen with fatty infiltration of the liver. There is an ovoid area of relatively increased parenchymal echogenicity within the posterior left hepatic lobe measuring up to 3.3 cm. This is favored to represent area of more focal fatty infiltration, however a mass is not excluded. Recommend further evaluation with nonemergent MRI of the abdomen with and without contrast. 2. Unremarkable sonographic appearance of the gallbladder. Electronically Signed   By: Davina Poke D.O.   On: 05/10/2022 09:04   CT ABDOMEN PELVIS W CONTRAST  Result Date: 05/09/2022 CLINICAL DATA:  Acute generalized abdominal pain. EXAM: CT ABDOMEN AND PELVIS WITH CONTRAST TECHNIQUE: Multidetector CT imaging of the abdomen and pelvis was performed using the standard protocol following bolus administration of intravenous contrast. RADIATION DOSE REDUCTION: This exam was performed according to the departmental dose-optimization program which includes automated exposure control, adjustment of the mA and/or kV according to patient size and/or use of iterative reconstruction technique. CONTRAST:  172m OMNIPAQUE IOHEXOL 300 MG/ML  SOLN COMPARISON:  August 08, 2020. FINDINGS: Lower chest: No acute abnormality. Hepatobiliary: No focal liver abnormality is seen. No gallstones, gallbladder wall thickening, or biliary dilatation. Pancreas: Large amount of inflammatory stranding is noted around the pancreas consistent with acute pancreatitis. No definite pseudocyst formation or ductal dilatation is noted.  Spleen: Normal in size without focal abnormality. Adrenals/Urinary Tract: Adrenal glands are unremarkable. Kidneys are normal, without renal calculi, focal lesion, or hydronephrosis. Bladder is unremarkable. Stomach/Bowel: Stomach is within normal limits. Appendix appears normal. No evidence of bowel wall thickening, distention, or inflammatory changes. Vascular/Lymphatic: No significant vascular findings are present. No enlarged abdominal or pelvic lymph nodes. Reproductive: Uterus and bilateral adnexa are unremarkable. Other: No abdominal wall hernia or abnormality. No abdominopelvic ascites. Musculoskeletal: No acute or significant osseous findings. IMPRESSION: Findings consistent with acute pancreatitis. No definite pseudocyst formation is noted. Electronically Signed   By: JMarijo ConceptionM.D.   On: 05/09/2022 18:19   DG Chest 2 View  Result Date: 05/09/2022 CLINICAL DATA:  Chest pain EXAM: CHEST - 2 VIEW COMPARISON:  None Available. FINDINGS: The heart size and mediastinal contours are within normal limits. Streaky opacity within the lingula. Lungs are otherwise clear. No pleural effusion or pneumothorax. The visualized skeletal structures are unremarkable. IMPRESSION: Streaky opacity within the lingula, which may represent atelectasis versus developing infiltrate. Electronically Signed   By: NDavina PokeD.O.   On: 05/09/2022 13:30    Assessment  36y.o. female with a history of alcohol use, HTN, Liddle syndrome, B12/folate deficiency, prior episodes of pancreatitis, presenting with recurrent pancreatitis suspected secondary to ETOH.  Pancreatitis: new area of low attenuation within pancreatic tail on CT this admission, with differentials including necrosis or developing cystic lesion, along with interval splenic vein thrombosis. Clinically improved and desires to advance to clear liquids. Unable to exclude underlying chronic pancreatitis.   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  On anticoagulation.  Recommend dedicated CT with pancreatic protocol in 6-8 weeks to re-evaluate pancreatic tail lesion.     Plan / Recommendations  Trial of clear liquid diet Absolute ETOH cessation Continue supportive measures Needs CT with pancreatic protocol in 6-8 weeks    LOS: 3 days    06/04/2022, 9:10 AM  Annitta Needs, PhD, [facility] Gastroenterology

## 2022-06-04 NOTE — Progress Notes (Signed)
PROGRESS NOTE    Jillian Shepherd  JKK:[id number] DOB: Dec 05, 1985 DOA: 05/31/2022 PCP: [name], MD    Brief Narrative:  36 year old female with history of previous alcohol use, was admitted to the hospital earlier this month for acute alcoholic pancreatitis.  She was readmitted to the hospital on 10/26 with worsening abdominal pain.  CT showed persistent pancreatitis and splenic vein thrombosis.  Started on IV fluids, pain management and anticoagulation.  GI following.   Assessment & Plan:   Principal Problem:   Acute pancreatitis Active Problems:   Essential hypertension   Diarrhea   Splenic vein thrombosis   Liddle's syndrome   Acute on chronic pancreatitis -Patient reports that she has abstained from alcohol since her last discharge -Worsening pain may be related to splenic vein thrombosis which is likely a sequelae of pancreatitis -Treat supportively with IV fluids, pain management -Appreciate GI input -Started on clear liquids today, advance slowly -Since she is currently afebrile and nontoxic, will hold off on antibiotics   Acute splenic vein thrombosis -Currently on therapeutic Lovenox   Hypertension -Continue on Norvasc   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  Still has some nausea, but no vomiting.  She is having bowel movements.  She is ambulating in the hall.  Started on some clear liquids which she appears to be tolerating.  Objective: Vitals:   06/03/22 1555 06/03/22  2043 06/04/22 0418 06/04/22 1456  BP: 131/89 132/84 116/85 (!) 137/94  Pulse: 93 (!) 101 85 93  Resp: '18 14 14 18  '$ Temp: 98 F (36.7 C) 98.2 F (36.8 C) 98.6 F (37 C) 98.3 F (36.8 C)  TempSrc:  Oral Oral Oral  SpO2: 96% 95% 94% 92%  Weight:      Height:        Intake/Output Summary (Last 24 hours) at 06/04/2022 1812 Last data filed at 06/04/2022 0452 Gross per 24 hour  Intake 1215.72 ml  Output --  Net 1215.72 ml   Filed Weights   05/31/22 0824  Weight: 83.6 kg    Examination:  General exam: Appears calm and comfortable  Respiratory system: Clear to auscultation. Respiratory effort normal. Cardiovascular system: S1 & S2 heard, RRR. No JVD, murmurs, rubs, gallops or clicks. No pedal edema. Gastrointestinal system: Abdomen is nondistended, soft and tender in epigastrium and left upper quadrant. No organomegaly or masses felt. Normal bowel sounds heard. Central nervous system: Alert and oriented. No focal neurological deficits. Extremities: Symmetric 5 x 5 power. Skin: No rashes, lesions or ulcers Psychiatry: Judgement and insight appear normal. Mood & affect appropriate.     Data Reviewed: I have personally reviewed following labs and imaging studies  CBC: Recent Labs  Lab 05/31/22 0856 06/01/22 0353 06/02/22 0441 06/03/22 0539 06/04/22 0533  WBC 10.6* 9.0 10.2 9.2 8.7  NEUTROABS 8.2*  --   --   --   --   HGB 16.5* 14.2 12.7 12.6 12.8  HCT 46.7* 42.1 37.2 37.0 36.9  MCV 105.7* 108.5* 108.5* 106.9* 105.7*  PLT 370 324 274 297 716   Basic Metabolic  Panel: Recent Labs  Lab 05/31/22 0856 06/01/22 0353 06/02/22 0441  NA 133* 134* 132*  K 3.9 4.3 4.3  CL 100 101 101  CO2 '24 24 24  '$ GLUCOSE 153* 122* 117*  BUN '12 11 10  '$ CREATININE 0.42* 0.44 0.41*  CALCIUM 9.7 8.8* 8.6*   GFR: Estimated Creatinine Clearance: 98.6 mL/min (A) (by C-G formula based on SCr of 0.41 mg/dL (L)). Liver Function Tests: Recent Labs  Lab 05/31/22 0856 06/01/22 0353  AST 67*  25  ALT 35 20  ALKPHOS 77 55  BILITOT 0.7 0.8  PROT 8.9* 6.8  ALBUMIN 4.0 3.1*   Recent Labs  Lab 05/31/22 0856  LIPASE 193*   No results for input(s): "AMMONIA" in the last 168 hours. Coagulation Profile: No results for input(s): "INR", "PROTIME" in the last 168 hours. Cardiac Enzymes: No results for input(s): "CKTOTAL", "CKMB", "CKMBINDEX", "TROPONINI" in the last 168 hours. BNP (last 3 results) No results for input(s): "PROBNP" in the last 8760 hours. HbA1C: No results for input(s): "HGBA1C" in the last 72 hours. CBG: No results for input(s): "GLUCAP" in the last 168 hours. Lipid Profile: No results for input(s): "CHOL", "HDL", "LDLCALC", "TRIG", "CHOLHDL", "LDLDIRECT" in the last 72 hours. Thyroid Function Tests: No results for input(s): "TSH", "T4TOTAL", "FREET4", "T3FREE", "THYROIDAB" in the last 72 hours. Anemia Panel: No results for input(s): "VITAMINB12", "FOLATE", "FERRITIN", "TIBC", "IRON", "RETICCTPCT" in the last 72 hours. Sepsis Labs: No results for input(s): "PROCALCITON", "LATICACIDVEN" in the last 168 hours.  No results found for this or any previous visit (from the past 240 hour(s)).       Radiology Studies: No results found.      Scheduled Meds:  amLODipine  10 mg Oral Daily   enoxaparin (LOVENOX) injection  80 mg Subcutaneous T55D   folic acid  1 mg Oral Daily   influenza vac split quadrivalent PF  0.5 mL Intramuscular Tomorrow-1000   potassium chloride SA  60 mEq Oral Daily   Continuous Infusions:  lactated ringers 1,000 mL with potassium chloride 20 mEq infusion 75 mL/hr at 06/04/22 0920     LOS: 3 days    Time spent: 23mns    JKathie Dike MD [facility]   If 7PM-7AM, please contact night-coverage [email or web address]  06/04/2022, 6:12 PM

## 2022-06-04 NOTE — Plan of Care (Signed)
  Problem: Education: Goal: Knowledge of General Education information will improve Description: Including pain rating scale, medication(s)/side effects and non-pharmacologic comfort measures Outcome: Progressing   Problem: Activity: Goal: Risk for activity intolerance will decrease Outcome: Progressing   

## 2022-06-05 DIAGNOSIS — K852 Alcohol induced acute pancreatitis without necrosis or infection: Secondary | ICD-10-CM

## 2022-06-05 DIAGNOSIS — I82890 Acute embolism and thrombosis of other specified veins: Secondary | ICD-10-CM

## 2022-06-05 LAB — BASIC METABOLIC PANEL
Anion gap: 9 (ref 5–15)
BUN: <5 mg/dL — ABNORMAL LOW (ref 6–20)
CO2: 27 mmol/L (ref 22–32)
Calcium: 8.8 mg/dL — ABNORMAL LOW (ref 8.9–10.3)
Chloride: 99 mmol/L (ref 98–111)
Creatinine, Ser: 0.46 mg/dL (ref 0.44–1.00)
GFR, Estimated: >60 mL/min (ref >60)
Glucose, Bld: 86 mg/dL (ref 70–99)
Potassium: 4.1 mmol/L (ref 3.5–5.1)
Sodium: 135 mmol/L (ref 135–145)

## 2022-06-05 LAB — CBC
HCT: 38 % (ref 36.0–46.0)
Hemoglobin: 12.9 g/dL (ref 12.0–15.0)
MCH: 36.3 pg — ABNORMAL HIGH (ref 26.0–34.0)
MCHC: 33.9 g/dL (ref 30.0–36.0)
MCV: 107 fL — ABNORMAL HIGH (ref 80.0–100.0)
Platelets: 329 10*3/uL (ref 150–400)
RBC: 3.55 MIL/uL — ABNORMAL LOW (ref 3.87–5.11)
RDW: 15.2 % (ref 11.5–15.5)
WBC: 7.7 10*3/uL (ref 4.0–10.5)
nRBC: 0 % (ref 0.0–0.2)

## 2022-06-05 MED ORDER — APIXABAN 5 MG PO TABS: 5.0000 mg | ORAL_TABLET | Freq: Two times a day (BID) | ORAL | Status: DC

## 2022-06-05 MED ORDER — APIXABAN 5 MG PO TABS
10.0000 mg | ORAL_TABLET | Freq: Two times a day (BID) | ORAL | Status: DC
  Administered 2022-06-05 – 2022-06-08 (×6): 10 mg via ORAL
  Filled 2022-06-05 (×6): qty 2

## 2022-06-05 NOTE — Progress Notes (Signed)
ANTICOAGULATION CONSULT NOTE   Pharmacy Consult for lovenox Indication: VTE tx splenic vein thrombosis  No Known Allergies  Patient Measurements: Height: '5\' 3"'$  (160 cm) Weight: 83.6 kg (184 lb 4.9 oz) IBW/kg (Calculated) : 52.4 HEPARIN DW (KG): 70.9   Labs: Recent Labs    06/03/22 0539 06/04/22 0533 06/05/22 0419  HGB 12.6 12.8 12.9  HCT 37.0 36.9 38.0  PLT 297 330 329    Estimated Creatinine Clearance: 98.6 mL/min (A) (by C-G formula based on SCr of 0.41 mg/dL (L)).  Assessment: Patient admitted with acute on chronic pancreatitis 2nd etoh use, newly started on anticoagulation for splenic vein thrombosis. Heparin since transitioned to therapeutic lovenox dosing. H/H and platelets have stabilized, no adverse events noted.   Goal of Therapy:  Therapeutic anticoagulation Monitor platelets by anticoagulation protocol: Yes   Plan:  Copntinue lovenox 80 mg subq every 12 hours. Continue to monotir H&H and platelets. F/u plans to transition to oral therapy  Lorenso Courier, PharmD Clinical Pharmacist 06/05/2022 8:20 AM

## 2022-06-05 NOTE — Progress Notes (Signed)
PROGRESS NOTE    Jillian Shepherd  WPY:[id number] DOB: June 20, 1986 DOA: 05/31/2022 PCP: [name], MD    Brief Narrative:  36 year old female with history of previous alcohol use, was admitted to the hospital earlier this month for acute alcoholic pancreatitis.  She was readmitted to the hospital on 10/26 with worsening abdominal pain.  CT showed persistent pancreatitis and splenic vein thrombosis.  Started on IV fluids, pain management and anticoagulation.  GI following.   Assessment & Plan:   Principal Problem:   Acute pancreatitis Active Problems:   Essential hypertension   Diarrhea   Splenic vein thrombosis   Liddle's syndrome   Acute on chronic pancreatitis -Patient reports that she has abstained from alcohol since her last discharge -Worsening pain may be related to splenic vein thrombosis which is likely a sequelae of pancreatitis -Treat supportively with IV fluids, pain management -Appreciate GI input -Tolerating clear liquids, advance to full liquids today.  If she tolerates this, anticipate soft foods tomorrow -Since she is currently afebrile and nontoxic, will hold off on antibiotics   Acute splenic vein thrombosis -Currently on therapeutic Lovenox, will transition to apixaban   Hypertension -Continue on Norvasc   Liddle's syndrome -Chronically on spironolactone and potassium supplementation -We will hold off on Aldactone for now and replace potassium as needed    DVT prophylaxis: Heparin given  Code Status: full code Family Communication: discussed with patient and husband Disposition Plan: Status is: Inpatient Remains inpatient appropriate because: continued pain management for pancreatitis      Consultants:  Gastroenterology  Procedures:    Antimicrobials:      Subjective: She says she is feeling better today.  Still requiring pain medicine.  Tolerating clear liquids, advance to full liquids.  Objective: Vitals:   06/04/22 1456  06/04/22 1952 06/05/22 0430 06/05/22 1447  BP: (!) 137/94 (!) 136/92 118/84 111/67  Pulse: 93 85 81 87  Resp: '18 19 13 16  '$ Temp: 98.3 F (36.8 C) 98.1 F (36.7 C) (!) 97.3 F (36.3 C) 98.4 F (36.9 C)  TempSrc: Oral Oral  Oral  SpO2: 92% 94% 96% 94%  Weight:      Height:        Intake/Output Summary (Last 24 hours) at 06/05/2022 1524 Last data filed at 06/05/2022 1300 Gross per 24 hour  Intake 1200 ml  Output --  Net 1200 ml   Filed Weights   05/31/22 0824  Weight: 83.6 kg    Examination:  General exam: Appears calm and comfortable  Respiratory system: Clear to auscultation. Respiratory effort normal. Cardiovascular system: S1 & S2 heard, RRR. No JVD, murmurs, rubs, gallops or clicks. No pedal edema. Gastrointestinal system: Abdomen is nondistended, soft and tender in epigastrium and left upper quadrant. No organomegaly or masses felt. Normal bowel sounds heard. Central nervous system: Alert and oriented. No focal neurological deficits. Extremities: Symmetric 5 x 5 power. Skin: No rashes, lesions or ulcers Psychiatry: Judgement and insight appear normal. Mood & affect appropriate.     Data Reviewed: I have personally reviewed following labs and imaging studies  CBC: Recent Labs  Lab 05/31/22 0856 06/01/22 0353 06/02/22 0441 06/03/22 0539 06/04/22 0533 06/05/22 0419  WBC 10.6* 9.0 10.2 9.2 8.7 7.7  NEUTROABS 8.2*  --   --   --   --   --   HGB 16.5* 14.2 12.7 12.6 12.8 12.9  HCT 46.7* 42.1 37.2 37.0 36.9 38.0  MCV 105.7* 108.5* 108.5* 106.9* 105.7* 107.0*  PLT 370 324 274 297  330 150   Basic Metabolic Panel: Recent Labs  Lab 05/31/22 0856 06/01/22 0353 06/02/22 0441 06/05/22 0419  NA 133* 134* 132* 135  K 3.9 4.3 4.3 4.1  CL 100 101 101 99  CO2 '24 24 24 27  '$ GLUCOSE 153* 122* 117* 86  BUN '12 11 10 '$ <5*  CREATININE 0.42* 0.44 0.41* 0.46  CALCIUM 9.7 8.8* 8.6* 8.8*   GFR: Estimated Creatinine Clearance: 98.6 mL/min (by C-G formula based on SCr of  0.46 mg/dL). Liver Function Tests: Recent Labs  Lab 05/31/22 0856 06/01/22 0353  AST 67* 25  ALT 35 20  ALKPHOS 77 55  BILITOT 0.7 0.8  PROT 8.9* 6.8  ALBUMIN 4.0 3.1*   Recent Labs  Lab 05/31/22 0856  LIPASE 193*   No results for input(s): "AMMONIA" in the last 168 hours. Coagulation Profile: No results for input(s): "INR", "PROTIME" in the last 168 hours. Cardiac Enzymes: No results for input(s): "CKTOTAL", "CKMB", "CKMBINDEX", "TROPONINI" in the last 168 hours. BNP (last 3 results) No results for input(s): "PROBNP" in the last 8760 hours. HbA1C: No results for input(s): "HGBA1C" in the last 72 hours. CBG: No results for input(s): "GLUCAP" in the last 168 hours. Lipid Profile: No results for input(s): "CHOL", "HDL", "LDLCALC", "TRIG", "CHOLHDL", "LDLDIRECT" in the last 72 hours. Thyroid Function Tests: No results for input(s): "TSH", "T4TOTAL", "FREET4", "T3FREE", "THYROIDAB" in the last 72 hours. Anemia Panel: No results for input(s): "VITAMINB12", "FOLATE", "FERRITIN", "TIBC", "IRON", "RETICCTPCT" in the last 72 hours. Sepsis Labs: No results for input(s): "PROCALCITON", "LATICACIDVEN" in the last 168 hours.  No results found for this or any previous visit (from the past 240 hour(s)).       Radiology Studies: No results found.      Scheduled Meds:  amLODipine  10 mg Oral Daily   folic acid  1 mg Oral Daily   influenza vac split quadrivalent PF  0.5 mL Intramuscular Tomorrow-1000   potassium chloride SA  60 mEq Oral Daily   Continuous Infusions:  lactated ringers 1,000 mL with potassium chloride 20 mEq infusion 50 mL/hr at 06/05/22 1253     LOS: 4 days    Time spent: 13mns    JKathie Dike MD [facility]   If 7PM-7AM, please contact night-coverage [email or web address]  06/05/2022, 3:24 PM

## 2022-06-05 NOTE — Progress Notes (Signed)
Gastroenterology Progress Note   Referring Provider: No ref. provider found Primary Care Physician:  [name], MD Primary Gastroenterologist:  Dr. Marius Ditch  Patient ID: Jillian Shepherd; [id number]; 10-15-1985    Subjective   Improved. Only having a small bit of LUQ pain at times. Passing gas and has had a BM. Shoulder pain resolved. Tolerated clear liquids yesterday and this morning. Interested in slowly advancing diet to see how she tolerates. Little to no nausea and no vomiting.    Objective   Vital signs in last 24 hours Temp:  [97.3 F (36.3 C)-98.3 F (36.8 C)] 97.3 F (36.3 C) (10/31 0430) Pulse Rate:  [81-93] 81 (10/31 0430) Resp:  [13-19] 13 (10/31 0430) BP: (118-137)/(84-94) 118/84 (10/31 0430) SpO2:  [92 %-96 %] 96 % (10/31 0430) Last BM Date : 06/04/22  Physical Exam General:   Alert and oriented, pleasant Head:  Normocephalic and atraumatic. Eyes:  No icterus, sclera clear. Conjuctiva pink.  Abdomen:  Bowel sounds present, soft, non-distended. Mild tenderness to LUQ. No HSM or hernias noted. No rebound or guarding. No masses appreciated  Msk:  Symmetrical without gross deformities. Normal posture. Neurologic:  Alert and  oriented x4;  grossly normal neurologically. Skin:  Warm and dry, intact without significant lesions.  Psych:  Alert and cooperative. Normal mood and affect.  Intake/Output from previous day: 10/30 0701 - 10/31 0700 In: 240 [P.O.:240] Out: -  Intake/Output this shift: No intake/output data recorded.  Lab Results  Recent Labs    06/03/22 0539 06/04/22 0533 06/05/22 0419  WBC 9.2 8.7 7.7  HGB 12.6 12.8 12.9  HCT 37.0 36.9 38.0  PLT 297 330 329   BMET No results for input(s): "NA", "K", "CL", "CO2", "GLUCOSE", "BUN", "CREATININE", "CALCIUM" in the last 72 hours. LFT No results for input(s): "PROT", "ALBUMIN", "AST", "ALT", "ALKPHOS", "BILITOT", "BILIDIR", "IBILI" in the last 72 hours. PT/INR No results for input(s):  "LABPROT", "INR" in the last 72 hours. Hepatitis Panel No results for input(s): "HEPBSAG", "HCVAB", "HEPAIGM", "HEPBIGM" in the last 72 hours.  Studies/Results DG CHEST PORT 1 VIEW  Result Date: 06/01/2022 CLINICAL DATA:  Chest pain EXAM: PORTABLE CHEST 1 VIEW COMPARISON:  Chest radiograph 05/09/2022, CTA chest 05/11/2022 FINDINGS: The cardiomediastinal silhouette is stable. Lung volumes are low. There is small left pleural effusion with adjacent retrocardiac opacity which may reflect atelectasis or pneumonia, likely similar to the CTA from 05/11/2022 but new since the radiograph from 05/09/2022. The left upper lobe is well-aerated. The right lung is clear. There is no right pleural effusion. There is no acute osseous abnormality. IMPRESSION: Small left pleural effusion and adjacent retrocardiac opacity which may reflect atelectasis or pneumonia, likely similar to the CTA chest from 05/11/2022 but new since 05/09/2022. Electronically Signed   By: Valetta Mole M.D.   On: 06/01/2022 12:56   CT ABDOMEN PELVIS W CONTRAST  Result Date: 05/31/2022 CLINICAL DATA:  LEFT side abdominal pain since 0300 hours, acid reflux symptoms yesterday, progressively worsening pain moving to LEFT side of abdomen and radiating to chest and shoulder, associated nausea and diarrhea. History collagen vascular disease, hypertension, pancreatitis, smoking EXAM: CT ABDOMEN AND PELVIS WITH CONTRAST TECHNIQUE: Multidetector CT imaging of the abdomen and pelvis was performed using the standard protocol following bolus administration of intravenous contrast. RADIATION DOSE REDUCTION: This exam was performed according to the departmental dose-optimization program which includes automated exposure control, adjustment of the mA and/or kV according to patient size and/or use of iterative reconstruction technique. CONTRAST:  157m OMNIPAQUE IOHEXOL 300 MG/ML SOLN IV. No oral contrast. COMPARISON:  05/09/2022 FINDINGS: Lower chest: Lung bases  clear Hepatobiliary: Gallbladder and liver normal appearance Pancreas: Enlarged edematous pancreas with diffuse infiltration of peripancreatic tissue planes consistent with acute pancreatitis, also seen on prior exam. New area of low attenuation within pancreatic tail however, approximately 3.0 x 2.6 cm, either pancreatic necrosis versus developing cystic lesion. Increased edema adjacent to stomach and spleen. Small amount of fluid in lesser sac. No pancreatic calcifications or ductal dilatation. Spleen: Normal appearance Adrenals/Urinary Tract: Adrenal glands, kidneys, ureters, and bladder normal appearance Stomach/Bowel: Normal appendix. Stomach and bowel loops otherwise unremarkable. Vascular/Lymphatic: Interval splenic vein thrombosis. Remaining vascular structures patent. Aorta normal caliber. No adenopathy. Reproductive: Unremarkable uterus and ovaries Other: Small LEFT inguinal hernia containing fat. Small amount of free fluid in pelvis. No free air. Musculoskeletal: Unremarkable IMPRESSION: Acute pancreatitis with increased surrounding peripancreatic and LEFT upper quadrant edema New area of low attenuation within pancreatic tail approximately 3.0 x 2.6 cm, either representing pancreatic necrosis or developing cystic lesion. Interval splenic vein thrombosis. Small LEFT inguinal hernia containing fat. Electronically Signed   By: MLavonia DanaM.D.   On: 05/31/2022 10:16   CT Angio Chest Pulmonary Embolism (PE) W or WO Contrast  Result Date: 05/11/2022 CLINICAL DATA:  Positive D-dimer, hypoxia.  Shortness of breath. EXAM: CT ANGIOGRAPHY CHEST WITH CONTRAST TECHNIQUE: Multidetector CT imaging of the chest was performed using the standard protocol during bolus administration of intravenous contrast. Multiplanar CT image reconstructions and MIPs were obtained to evaluate the vascular anatomy. RADIATION DOSE REDUCTION: This exam was performed according to the departmental dose-optimization program which includes  automated exposure control, adjustment of the mA and/or kV according to patient size and/or use of iterative reconstruction technique. CONTRAST:  758mOMNIPAQUE IOHEXOL 350 MG/ML SOLN COMPARISON:  None Available. FINDINGS: Cardiovascular: Satisfactory opacification of the pulmonary arteries to the segmental level. No evidence of pulmonary embolism. Normal heart size. No pericardial effusion. Mediastinum/Nodes: No enlarged mediastinal, hilar, or axillary lymph nodes. Thyroid gland, trachea, and esophagus demonstrate no significant findings. Lungs/Pleura: Moderate left and small right pleural effusions are present. There are patchy multifocal ground-glass opacities in the upper lobes, right greater than left. There is compressive atelectasis in the lingula and left lower lobe. No pneumothorax. Trachea and central airways are patent. Upper Abdomen: There is trace free fluid in the right upper quadrant. Musculoskeletal: No chest wall abnormality. No acute or significant osseous findings. Review of the MIP images confirms the above findings. IMPRESSION: 1. No evidence for pulmonary embolism. 2. Moderate left and small right pleural effusions. 3. Patchy multifocal ground-glass opacities in the upper lobes, right greater than left. Findings may be infectious/inflammatory or related to pulmonary edema. 4. Trace free fluid in the right upper quadrant of the abdomen. Electronically Signed   By: AmRonney Asters.D.   On: 05/11/2022 21:12   ECHOCARDIOGRAM COMPLETE  Result Date: 05/10/2022    ECHOCARDIOGRAM REPORT   Patient Name:   NASAHANA BOYLANDate of Exam: 05/10/2022 Medical Rec #:  [id number]     Height:       63.0 in Accession #:    [id number]    Weight:       190.3 lb Date of Birth:  2/January 29, 1986     BSA:          1.893 m Patient Age:    36 years        BP:  166/104 mmHg Patient Gender: F               HR:           117 bpm. Exam Location:  [facility] Procedure: 2D Echo, Cardiac Doppler and Color Doppler  Indications:    Syncope  History:        Patient has no prior history of Echocardiogram examinations.                 Signs/Symptoms:Syncope; Risk Factors:Hypertension. ETOH abuse.  Sonographer:    Wenda Low Referring Phys: [id number] OLADAPO ADEFESO IMPRESSIONS  1. Difficult acoustic windows Endocardium is difficult to see in apical views. Possible basal inferior hypokinesis. Would recommend limited echo with Definity to confirm wall motion. Left ventricular ejection fraction, by estimation, is 60 to 65%. The left ventricle has normal function. There is mild left ventricular hypertrophy. Indeterminate diastolic filling due to E-A fusion.  2. Right ventricular systolic function is normal. The right ventricular size is normal.  3. The mitral valve is normal in structure. Trivial mitral valve regurgitation.  4. The aortic valve is normal in structure. Aortic valve regurgitation is not visualized.  5. The inferior vena cava is normal in size with greater than 50% respiratory variability, suggesting right atrial pressure of 3 mmHg. FINDINGS  Left Ventricle: Difficult acoustic windows Endocardium is difficult to see in apical views. Possible basal inferior hypokinesis. Would recommend limited echo with Definity to confirm wall motion. Left ventricular ejection fraction, by estimation, is 60 to 65%. The left ventricle has normal function. The left ventricular internal cavity size was normal in size. There is mild left ventricular hypertrophy. Indeterminate diastolic filling due to E-A fusion. Right Ventricle: The right ventricular size is normal. Right vetricular wall thickness was not assessed. Right ventricular systolic function is normal. Left Atrium: Left atrial size was normal in size. Right Atrium: Right atrial size was normal in size. Pericardium: There is no evidence of pericardial effusion. Mitral Valve: The mitral valve is normal in structure. Trivial mitral valve regurgitation. MV peak gradient, 5.9 mmHg.  The mean mitral valve gradient is 2.0 mmHg. Tricuspid Valve: The tricuspid valve is normal in structure. Tricuspid valve regurgitation is trivial. Aortic Valve: The aortic valve is normal in structure. Aortic valve regurgitation is not visualized. Aortic valve mean gradient measures 4.0 mmHg. Aortic valve peak gradient measures 7.7 mmHg. Aortic valve area, by VTI measures 2.85 cm. Pulmonic Valve: The pulmonic valve was normal in structure. Pulmonic valve regurgitation is trivial. Aorta: The aortic root is normal in size and structure. Venous: The inferior vena cava is normal in size with greater than 50% respiratory variability, suggesting right atrial pressure of 3 mmHg. IAS/Shunts: No atrial level shunt detected by color flow Doppler.  LEFT VENTRICLE PLAX 2D LVIDd:         4.80 cm   Diastology LVIDs:         2.90 cm   LV e' medial:    7.40 cm/s LV PW:         1.10 cm   LV E/e' medial:  7.9 LV IVS:        1.20 cm   LV e' lateral:   8.59 cm/s LVOT diam:     2.00 cm   LV E/e' lateral: 6.8 LV SV:         66 LV SV Index:   35 LVOT Area:     3.14 cm  RIGHT VENTRICLE RV Basal diam:  2.80 cm RV  Mid diam:    2.50 cm RV S prime:     20.50 cm/s TAPSE (M-mode): 2.7 cm LEFT ATRIUM             Index        RIGHT ATRIUM           Index LA diam:        3.50 cm 1.85 cm/m   RA Area:     13.40 cm LA Vol (A2C):   26.1 ml 13.79 ml/m  RA Volume:   31.40 ml  16.59 ml/m LA Vol (A4C):   30.4 ml 16.06 ml/m LA Biplane Vol: 29.2 ml 15.42 ml/m  AORTIC VALVE                    PULMONIC VALVE AV Area (Vmax):    2.49 cm     PV Vmax:       1.10 m/s AV Area (Vmean):   2.34 cm     PV Peak grad:  4.8 mmHg AV Area (VTI):     2.85 cm AV Vmax:           139.00 cm/s AV Vmean:          95.100 cm/s AV VTI:            0.230 m AV Peak Grad:      7.7 mmHg AV Mean Grad:      4.0 mmHg LVOT Vmax:         110.00 cm/s LVOT Vmean:        70.700 cm/s LVOT VTI:          0.209 m LVOT/AV VTI ratio: 0.91  AORTA Ao Root diam: 3.20 cm MITRAL VALVE MV Area  (PHT): 7.82 cm     SHUNTS MV Area VTI:   3.95 cm     Systemic VTI:  0.21 m MV Peak grad:  5.9 mmHg     Systemic Diam: 2.00 cm MV Mean grad:  2.0 mmHg MV Vmax:       1.21 m/s MV Vmean:      66.3 cm/s MV Decel Time: 97 msec MV E velocity: 58.70 cm/s MV A velocity: 102.00 cm/s MV E/A ratio:  0.58 Dorris Carnes MD Electronically signed by Dorris Carnes MD Signature Date/Time: 05/10/2022/5:02:45 PM    Final    US Carotid Bilateral  Result Date: 05/10/2022 CLINICAL DATA:  Syncopal episode. History of hypertension and smoking. EXAM: BILATERAL CAROTID DUPLEX ULTRASOUND TECHNIQUE: Pearline Cables scale imaging, color Doppler and duplex ultrasound were performed of bilateral carotid and vertebral arteries in the neck. COMPARISON:  None Available. FINDINGS: Criteria: Quantification of carotid stenosis is based on velocity parameters that correlate the residual internal carotid diameter with NASCET-based stenosis levels, using the diameter of the distal internal carotid lumen as the denominator for stenosis measurement. The following velocity measurements were obtained: RIGHT ICA: 103/42 cm/sec CCA: 127/51 cm/sec SYSTOLIC ICA/CCA RATIO:  0.9 ECA: 135 cm/sec LEFT ICA: 90/32 cm/sec CCA: 700/17 cm/sec SYSTOLIC ICA/CCA RATIO:  0.8 ECA: 124 cm/sec RIGHT CAROTID ARTERY: There is no grayscale evidence of significant intimal thickening or atherosclerotic plaque affecting the interrogated portions of the right carotid system. There are no elevated peak systolic velocities within the interrogated course of the right internal carotid artery to suggest a hemodynamically significant stenosis. RIGHT VERTEBRAL ARTERY:  Antegrade flow LEFT CAROTID ARTERY: There is no grayscale evidence of significant intimal thickening or atherosclerotic plaque affecting the interrogated portions of the left carotid system. There are  no elevated peak systolic velocities within the interrogated course of the left internal carotid artery to suggest a hemodynamically  significant stenosis. LEFT VERTEBRAL ARTERY:  Antegrade flow IMPRESSION: Unremarkable carotid Doppler ultrasound. Electronically Signed   By: Sandi Mariscal M.D.   On: 05/10/2022 13:23   US Abdomen Limited RUQ (LIVER/GB)  Result Date: 05/10/2022 CLINICAL DATA:  Pancreatitis EXAM: ULTRASOUND ABDOMEN LIMITED RIGHT UPPER QUADRANT COMPARISON:  CT 05/09/2022 FINDINGS: Gallbladder: No gallstones or wall thickening visualized. No sonographic Murphy sign noted by sonographer. Common bile duct: Diameter: 3 mm. Liver: Mildly increased hepatic parenchymal echogenicity. There is an ovoid area within the posterior left hepatic lobe of relatively increased parenchymal echogenicity measuring 3.1 x 2.3 x 3.3 cm, which does demonstrate vascularity on color Doppler. Portal vein is patent on color Doppler imaging with normal direction of blood flow towards the liver. Other: None. IMPRESSION: 1. The liver is mildly increased in echogenicity. This is a nonspecific finding but is most commonly seen with fatty infiltration of the liver. There is an ovoid area of relatively increased parenchymal echogenicity within the posterior left hepatic lobe measuring up to 3.3 cm. This is favored to represent area of more focal fatty infiltration, however a mass is not excluded. Recommend further evaluation with nonemergent MRI of the abdomen with and without contrast. 2. Unremarkable sonographic appearance of the gallbladder. Electronically Signed   By: Davina Poke D.O.   On: 05/10/2022 09:04   CT ABDOMEN PELVIS W CONTRAST  Result Date: 05/09/2022 CLINICAL DATA:  Acute generalized abdominal pain. EXAM: CT ABDOMEN AND PELVIS WITH CONTRAST TECHNIQUE: Multidetector CT imaging of the abdomen and pelvis was performed using the standard protocol following bolus administration of intravenous contrast. RADIATION DOSE REDUCTION: This exam was performed according to the departmental dose-optimization program which includes automated exposure  control, adjustment of the mA and/or kV according to patient size and/or use of iterative reconstruction technique. CONTRAST:  121m OMNIPAQUE IOHEXOL 300 MG/ML  SOLN COMPARISON:  August 08, 2020. FINDINGS: Lower chest: No acute abnormality. Hepatobiliary: No focal liver abnormality is seen. No gallstones, gallbladder wall thickening, or biliary dilatation. Pancreas: Large amount of inflammatory stranding is noted around the pancreas consistent with acute pancreatitis. No definite pseudocyst formation or ductal dilatation is noted. Spleen: Normal in size without focal abnormality. Adrenals/Urinary Tract: Adrenal glands are unremarkable. Kidneys are normal, without renal calculi, focal lesion, or hydronephrosis. Bladder is unremarkable. Stomach/Bowel: Stomach is within normal limits. Appendix appears normal. No evidence of bowel wall thickening, distention, or inflammatory changes. Vascular/Lymphatic: No significant vascular findings are present. No enlarged abdominal or pelvic lymph nodes. Reproductive: Uterus and bilateral adnexa are unremarkable. Other: No abdominal wall hernia or abnormality. No abdominopelvic ascites. Musculoskeletal: No acute or significant osseous findings. IMPRESSION: Findings consistent with acute pancreatitis. No definite pseudocyst formation is noted. Electronically Signed   By: JMarijo ConceptionM.D.   On: 05/09/2022 18:19   DG Chest 2 View  Result Date: 05/09/2022 CLINICAL DATA:  Chest pain EXAM: CHEST - 2 VIEW COMPARISON:  None Available. FINDINGS: The heart size and mediastinal contours are within normal limits. Streaky opacity within the lingula. Lungs are otherwise clear. No pleural effusion or pneumothorax. The visualized skeletal structures are unremarkable. IMPRESSION: Streaky opacity within the lingula, which may represent atelectasis versus developing infiltrate. Electronically Signed   By: NDavina PokeD.O.   On: 05/09/2022 13:30    Assessment  36y.o. female with a  history of alcohol use, HTN, Liddle syndromw, B12/folate deficiency, prior episodes  of pancreatitis presenting with recurrent pancreatitis. GI consulted for further evaluation and management.   Pancreatitis: Suspected to be secondary to ETOH. CT A/P this admission with area of low attenuation concerning for necrosis vs cystic lesion. Course also complicated by newly developed splenic vein thrombosis. She has been slowly improving clinically and tolerated clear liquids. She has been mobile. Will advance diet to full liquids and if she tolerates this will do GI soft/low residue diet. Will need dedicated CT pancreatic protocol in 6-8 weeks to further evaluate pancreatic lesion.   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 Initially started on heparin gtt and now maintained on Lovenox.    Plan / Recommendations  Trial full liquids, if tolerated may advance to GI soft/low residue diet.  ETOH cessation Decrease IV fluids to 50 cc/hr CT pancreatic protocol on 6-8 weeks.  Hospital follow up with primary GI at [facility] in 3-4 weeks    LOS: 4 days    06/05/2022, 10:24 AM   Venetia Night, MSN, FNP-BC, AGACNP-BC [facility]

## 2022-06-05 NOTE — Progress Notes (Signed)
ANTICOAGULATION BRIEF NOTE   Pharmacy Consult for lovenox--> Apixaban Indication: VTE/splenic vein thrombosis  Assessment/Plan: Advancing diet. Ok to transition to oral DOAC  - Lovenox dc'd - Apixaban '10mg'$  po BID x 7 days, then - Apixaban '5mg'$  po BID - F/u education tomorrow  Lorenso Courier, PharmD Clinical Pharmacist 06/05/2022 3:35 PM

## 2022-06-06 DIAGNOSIS — K852 Alcohol induced acute pancreatitis without necrosis or infection: Secondary | ICD-10-CM | POA: Diagnosis not present

## 2022-06-06 LAB — CBC
HCT: 37.4 % (ref 36.0–46.0)
Hemoglobin: 12.9 g/dL (ref 12.0–15.0)
MCH: 35.9 pg — ABNORMAL HIGH (ref 26.0–34.0)
MCHC: 34.5 g/dL (ref 30.0–36.0)
MCV: 104.2 fL — ABNORMAL HIGH (ref 80.0–100.0)
Platelets: 359 10*3/uL (ref 150–400)
RBC: 3.59 MIL/uL — ABNORMAL LOW (ref 3.87–5.11)
RDW: 15.4 % (ref 11.5–15.5)
WBC: 8.7 10*3/uL (ref 4.0–10.5)
nRBC: 0 % (ref 0.0–0.2)

## 2022-06-06 MED ORDER — HYDROMORPHONE HCL 1 MG/ML IJ SOLN
0.5000 mg | INTRAMUSCULAR | Status: DC | PRN
  Administered 2022-06-06 – 2022-06-08 (×12): 1 mg via INTRAVENOUS
  Filled 2022-06-06 (×12): qty 1

## 2022-06-06 NOTE — Progress Notes (Addendum)
Subjective: Patient states that she did okay with full liquids yesterday, reports she was started on solids last night, did okay with that, had breakfast this morning and started having Left shoulder pain and later epigastric pain again with nausea around 9am this morning. Denies vomiting. Last BM was yesterday, clay like color/consistency. Denies fevers or chills but endorses some diaphoresis.   Objective: Vital signs in last 24 hours: Temp:  [98.4 F (36.9 C)-99 F (37.2 C)] 99 F (37.2 C) (10/31 2032) Pulse Rate:  [87-92] 92 (10/31 2032) Resp:  [13-16] 13 (10/31 2032) BP: (111-136)/(67-87) 136/87 (10/31 2032) SpO2:  [94 %-96 %] 96 % (10/31 2032) Last BM Date : 06/04/22 General:   Alert and oriented, pleasant Head:  Normocephalic and atraumatic. Eyes:  No icterus, sclera clear. Conjuctiva pink.  Mouth:  Without lesions, mucosa pink and moist.  Heart:  S1, S2 present, no murmurs noted.  Lungs: Clear to auscultation bilaterally, without wheezing, rales, or rhonchi.  Abdomen:  Bowel sounds present, soft, non-distended. Mild TTP of Epigastric and LUQ. No HSM or hernias noted. No rebound or guarding. No masses appreciated  Msk:  Symmetrical without gross deformities. Normal posture. Pulses:  Normal pulses noted. Extremities:  Without clubbing or edema. Neurologic:  Alert and  oriented x4;  grossly normal neurologically. Skin:  Warm and dry, intact without significant lesions.  Psych:  Alert and cooperative. Normal mood and affect.  Intake/Output from previous day: 10/31 0701 - 11/01 0700 In: 1200 [P.O.:1200] Out: -   Lab Results: Recent Labs    06/04/22 0533 06/05/22 0419 06/06/22 0421  WBC 8.7 7.7 8.7  HGB 12.8 12.9 12.9  HCT 36.9 38.0 37.4  PLT 330 329 359   BMET Recent Labs    06/05/22 0419  NA 135  K 4.1  CL 99  CO2 27  GLUCOSE 86  BUN <5*  CREATININE 0.46  CALCIUM 8.8*    Assessment: Jillian Shepherd is a 36 y.o. female with a history of alcohol use, HTN,  Liddle syndrome, B12/folate deficiency, prior episodes of pancreatitis presenting with recurrent pancreatitis. GI consulted for further evaluation and management.    Pancreatitis: suspected secondary to ETOH. CT A/p this admission with area of low attentuation concerning for necrosis vs. Cystic lesion. Course complicated by newly developed splenic vein thrombosis. Diet advanced to full liquids yesterday morning which patient tolerated well, soft diet yesterday evening and again this morning, thereafter began having L shoulder pain, then epigastric pain and nausea. She is however, not having vomiting and able to keep down her POs. L shoulder pain could be referred, however appears somewhat worse with movement so query if this is musculoskeletal vs referred pain. WBC WNL. Could potentially discharge today, May need to back down to full liquids and reconsider soft diet in the next 1-2 days whether here or at home.   Will need CT pancreatic protocol in 6-8 weeks for further evaluation of pancreatic lesions, as above.  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 Started on Eliquis '10mg'$  BID yesterday.     Plan: Complete ETOH cessation CT pancreatic protocol 6-8 weeks Back down to full liquids  Pain and nausea management per hospitalist  Eliquis for splenic vein thrombosis   Attending note: As above.  Worsening of abdominal pain overnight.  Rates it 8/10 to me.  Agree with backing off on diet for the next 24 hours.  We will reassess tomorrow morning.  Discussed electronically with Dr. Manuella Ghazi.      LOS: 5 days  06/06/2022, 9:00 AM   Chelsea L. Alver Sorrow, MSN, APRN, AGNP-C Adult-Gerontology Nurse Practitioner [facility]

## 2022-06-06 NOTE — Progress Notes (Signed)
PROGRESS NOTE    Jillian Shepherd  UXL:[id number] DOB: 09-06-1985 DOA: 05/31/2022 PCP: [name], MD   Brief Narrative:    36 year old female with history of previous alcohol use, was admitted to the hospital earlier this month for acute alcoholic pancreatitis.  She was readmitted to the hospital on 10/26 with worsening abdominal pain.  CT showed persistent pancreatitis and splenic vein thrombosis.  Started on IV fluids, pain management and anticoagulation.  GI following.  Assessment & Plan:   Principal Problem:   Acute pancreatitis Active Problems:   Essential hypertension   Diarrhea   Splenic vein thrombosis   Liddle's syndrome  Assessment and Plan:   Acute on chronic pancreatitis -Patient reports that she has abstained from alcohol since her last discharge -Worsening pain may be related to splenic vein thrombosis which is likely a sequelae of pancreatitis -Treat supportively with IV fluids, pain management -Appreciate GI input -Had worsening pain with soft foods, continue full liquids as recommended by GI and watch another 24 hours -Since she is currently afebrile and nontoxic, will hold off on antibiotics   Acute splenic vein thrombosis -Currently on Eliquis   Hypertension -Continue on Norvasc   Liddle's syndrome -Chronically on spironolactone and potassium supplementation -We will hold off on Aldactone for now and replace potassium as needed     DVT prophylaxis: Eliquis   Code Status: full code Family Communication: discussed with patient  Disposition Plan: Status is: Inpatient Remains inpatient appropriate because: continued pain management for pancreatitis          Consultants:  Gastroenterology   Procedures:      Antimicrobials:   None   Subjective: Patient seen and evaluated today with some worsening abdominal pain with radiation into the left shoulder this morning.  She was also noted to have some nausea this morning, but no  vomiting.  Objective: Vitals:   06/04/22 1952 06/05/22 0430 06/05/22 1447 06/05/22 2032  BP: (!) 136/92 118/84 111/67 136/87  Pulse: 85 81 87 92  Resp: '19 13 16 13  '$ Temp: 98.1 F (36.7 C) (!) 97.3 F (36.3 C) 98.4 F (36.9 C) 99 F (37.2 C)  TempSrc: Oral  Oral Oral  SpO2: 94% 96% 94% 96%  Weight:      Height:        Intake/Output Summary (Last 24 hours) at 06/06/2022 1156 Last data filed at 06/05/2022 1900 Gross per 24 hour  Intake 720 ml  Output --  Net 720 ml   Filed Weights   05/31/22 0824  Weight: 83.6 kg    Examination:  General exam: Appears calm and comfortable  Respiratory system: Clear to auscultation. Respiratory effort normal. Cardiovascular system: S1 & S2 heard, RRR.  Gastrointestinal system: Abdomen is soft Central nervous system: Alert and awake Extremities: No edema Skin: No significant lesions noted Psychiatry: Flat affect.    Data Reviewed: I have personally reviewed following labs and imaging studies  CBC: Recent Labs  Lab 05/31/22 0856 06/01/22 0353 06/02/22 0441 06/03/22 0539 06/04/22 0533 06/05/22 0419 06/06/22 0421  WBC 10.6*   < > 10.2 9.2 8.7 7.7 8.7  NEUTROABS 8.2*  --   --   --   --   --   --   HGB 16.5*   < > 12.7 12.6 12.8 12.9 12.9  HCT 46.7*   < > 37.2 37.0 36.9 38.0 37.4  MCV 105.7*   < > 108.5* 106.9* 105.7* 107.0* 104.2*  PLT 370   < > 274 297 330  329 359   < > = values in this interval not displayed.   Basic Metabolic Panel: Recent Labs  Lab 05/31/22 0856 06/01/22 0353 06/02/22 0441 06/05/22 0419  NA 133* 134* 132* 135  K 3.9 4.3 4.3 4.1  CL 100 101 101 99  CO2 '24 24 24 27  '$ GLUCOSE 153* 122* 117* 86  BUN '12 11 10 '$ <5*  CREATININE 0.42* 0.44 0.41* 0.46  CALCIUM 9.7 8.8* 8.6* 8.8*   GFR: Estimated Creatinine Clearance: 98.6 mL/min (by C-G formula based on SCr of 0.46 mg/dL). Liver Function Tests: Recent Labs  Lab 05/31/22 0856 06/01/22 0353  AST 67* 25  ALT 35 20  ALKPHOS 77 55  BILITOT 0.7 0.8   PROT 8.9* 6.8  ALBUMIN 4.0 3.1*   Recent Labs  Lab 05/31/22 0856  LIPASE 193*   No results for input(s): "AMMONIA" in the last 168 hours. Coagulation Profile: No results for input(s): "INR", "PROTIME" in the last 168 hours. Cardiac Enzymes: No results for input(s): "CKTOTAL", "CKMB", "CKMBINDEX", "TROPONINI" in the last 168 hours. BNP (last 3 results) No results for input(s): "PROBNP" in the last 8760 hours. HbA1C: No results for input(s): "HGBA1C" in the last 72 hours. CBG: No results for input(s): "GLUCAP" in the last 168 hours. Lipid Profile: No results for input(s): "CHOL", "HDL", "LDLCALC", "TRIG", "CHOLHDL", "LDLDIRECT" in the last 72 hours. Thyroid Function Tests: No results for input(s): "TSH", "T4TOTAL", "FREET4", "T3FREE", "THYROIDAB" in the last 72 hours. Anemia Panel: No results for input(s): "VITAMINB12", "FOLATE", "FERRITIN", "TIBC", "IRON", "RETICCTPCT" in the last 72 hours. Sepsis Labs: No results for input(s): "PROCALCITON", "LATICACIDVEN" in the last 168 hours.  No results found for this or any previous visit (from the past 240 hour(s)).       Radiology Studies: No results found.      Scheduled Meds:  amLODipine  10 mg Oral Daily   apixaban  10 mg Oral BID   Followed by   Derrill Memo ON 06/12/2022] apixaban  5 mg Oral BID   folic acid  1 mg Oral Daily   influenza vac split quadrivalent PF  0.5 mL Intramuscular Tomorrow-1000   potassium chloride SA  60 mEq Oral Daily   Continuous Infusions:  lactated ringers 1,000 mL with potassium chloride 20 mEq infusion 50 mL/hr at 06/06/22 0908     LOS: 5 days    Time spent: 35 minutes    Aerianna Losey Darleen Crocker, DO [facility]  If 7PM-7AM, please contact night-coverage [email or web address] 06/06/2022, 11:56 AM

## 2022-06-07 DIAGNOSIS — K859 Acute pancreatitis without necrosis or infection, unspecified: Secondary | ICD-10-CM | POA: Diagnosis not present

## 2022-06-07 LAB — CBC
HCT: 40.1 % (ref 36.0–46.0)
Hemoglobin: 14 g/dL (ref 12.0–15.0)
MCH: 35.8 pg — ABNORMAL HIGH (ref 26.0–34.0)
MCHC: 34.9 g/dL (ref 30.0–36.0)
MCV: 102.6 fL — ABNORMAL HIGH (ref 80.0–100.0)
Platelets: 394 K/uL (ref 150–400)
RBC: 3.91 MIL/uL (ref 3.87–5.11)
RDW: 15.5 % (ref 11.5–15.5)
WBC: 11.5 K/uL — ABNORMAL HIGH (ref 4.0–10.5)
nRBC: 0 % (ref 0.0–0.2)

## 2022-06-07 NOTE — Progress Notes (Signed)
PROGRESS NOTE    Jillian Shepherd  IRS:[id number] DOB: 24-Aug-1985 DOA: 05/31/2022 PCP: [name], MD   Brief Narrative:    36 year old female with history of previous alcohol use, was admitted to the hospital earlier this month for acute alcoholic pancreatitis.  She was readmitted to the hospital on 10/26 with worsening abdominal pain.  CT showed persistent pancreatitis and splenic vein thrombosis.  Started on IV fluids, pain management and anticoagulation.  GI following.   Assessment & Plan:   Principal Problem:   Acute pancreatitis Active Problems:   Essential hypertension   Diarrhea   Splenic vein thrombosis   Liddle's syndrome  Assessment and Plan:  Acute on chronic pancreatitis -Patient reports that she has abstained from alcohol since her last discharge -Worsening pain may be related to splenic vein thrombosis which is likely a sequelae of pancreatitis -Treat supportively with IV fluids, pain management -Appreciate ongoingGI input -Continue full liquids as recommended by GI and watch another 24 hours -Since she is currently afebrile and nontoxic, will hold off on antibiotics   Acute splenic vein thrombosis -Currently on Eliquis   Hypertension -Continue on Norvasc   Liddle's syndrome -Chronically on spironolactone and potassium supplementation -We will hold off on Aldactone for now and replace potassium as needed     DVT prophylaxis: Eliquis   Code Status: full code Family Communication: discussed with patient  Disposition Plan: Status is: Inpatient Remains inpatient appropriate because: continued pain management for pancreatitis     Consultants:  Gastroenterology   Procedures:      Antimicrobials:   None  Subjective: Patient seen and evaluated today with little oral intake due to fear of worsening pain.  She denies any significant nausea or vomiting or other concerns.  She is afraid of going home too soon and has required an increase in IV  Dilaudid yesterday given worsening pain.  Objective: Vitals:   06/06/22 1545 06/06/22 2014 06/07/22 0355 06/07/22 1417  BP: (!) 146/96 (!) 146/102 136/88 (!) 132/98  Pulse: (!) 109 (!) 106 100 (!) 128  Resp: '20 17 20 20  '$ Temp: 98.6 F (37 C) 98.4 F (36.9 C) 98.2 F (36.8 C) 98 F (36.7 C)  TempSrc: Oral Oral Oral Oral  SpO2: 98% 93% 91% 91%  Weight:      Height:        Intake/Output Summary (Last 24 hours) at 06/07/2022 1426 Last data filed at 06/07/2022 0853 Gross per 24 hour  Intake 5287.25 ml  Output --  Net 5287.25 ml   Filed Weights   05/31/22 0824  Weight: 83.6 kg    Examination:  General exam: Appears calm and comfortable  Respiratory system: Clear to auscultation. Respiratory effort normal. Cardiovascular system: S1 & S2 heard, RRR.  Gastrointestinal system: Abdomen is soft Central nervous system: Alert and awake Extremities: No edema Skin: No significant lesions noted Psychiatry: Flat affect.    Data Reviewed: I have personally reviewed following labs and imaging studies  CBC: Recent Labs  Lab 06/03/22 0539 06/04/22 0533 06/05/22 0419 06/06/22 0421 06/07/22 0415  WBC 9.2 8.7 7.7 8.7 11.5*  HGB 12.6 12.8 12.9 12.9 14.0  HCT 37.0 36.9 38.0 37.4 40.1  MCV 106.9* 105.7* 107.0* 104.2* 102.6*  PLT 297 330 329 359 009   Basic Metabolic Panel: Recent Labs  Lab 06/01/22 0353 06/02/22 0441 06/05/22 0419  NA 134* 132* 135  K 4.3 4.3 4.1  CL 101 101 99  CO2 '24 24 27  '$ GLUCOSE 122* 117* 86  BUN 11 10 <5*  CREATININE 0.44 0.41* 0.46  CALCIUM 8.8* 8.6* 8.8*   GFR: Estimated Creatinine Clearance: 98.6 mL/min (by C-G formula based on SCr of 0.46 mg/dL). Liver Function Tests: Recent Labs  Lab 06/01/22 0353  AST 25  ALT 20  ALKPHOS 55  BILITOT 0.8  PROT 6.8  ALBUMIN 3.1*   No results for input(s): "LIPASE", "AMYLASE" in the last 168 hours. No results for input(s): "AMMONIA" in the last 168 hours. Coagulation Profile: No results for  input(s): "INR", "PROTIME" in the last 168 hours. Cardiac Enzymes: No results for input(s): "CKTOTAL", "CKMB", "CKMBINDEX", "TROPONINI" in the last 168 hours. BNP (last 3 results) No results for input(s): "PROBNP" in the last 8760 hours. HbA1C: No results for input(s): "HGBA1C" in the last 72 hours. CBG: No results for input(s): "GLUCAP" in the last 168 hours. Lipid Profile: No results for input(s): "CHOL", "HDL", "LDLCALC", "TRIG", "CHOLHDL", "LDLDIRECT" in the last 72 hours. Thyroid Function Tests: No results for input(s): "TSH", "T4TOTAL", "FREET4", "T3FREE", "THYROIDAB" in the last 72 hours. Anemia Panel: No results for input(s): "VITAMINB12", "FOLATE", "FERRITIN", "TIBC", "IRON", "RETICCTPCT" in the last 72 hours. Sepsis Labs: No results for input(s): "PROCALCITON", "LATICACIDVEN" in the last 168 hours.  No results found for this or any previous visit (from the past 240 hour(s)).       Radiology Studies: No results found.      Scheduled Meds:  amLODipine  10 mg Oral Daily   apixaban  10 mg Oral BID   Followed by   Derrill Memo ON 06/12/2022] apixaban  5 mg Oral BID   folic acid  1 mg Oral Daily   influenza vac split quadrivalent PF  0.5 mL Intramuscular Tomorrow-1000   potassium chloride SA  60 mEq Oral Daily   Continuous Infusions:  lactated ringers 1,000 mL with potassium chloride 20 mEq infusion 50 mL/hr at 06/07/22 0351     LOS: 6 days    Time spent: 35 minutes    Jillian Shepherd Darleen Crocker, DO [facility]  If 7PM-7AM, please contact night-coverage [email or web address] 06/07/2022, 2:26 PM

## 2022-06-07 NOTE — Progress Notes (Signed)
Gastroenterology Progress Note   Referring Provider: No ref. provider found Primary Care Physician:  [name], MD Primary Gastroenterologist:  Dr. Marius Ditch ([facility])  Patient ID: Jillian Shepherd; [id number]; May 09, 1986   Subjective:    Patient states she was scared to eat dinner last night because her pain was worse yesterday after having been advanced to solids. She thinks she over did it with her diet. She had full liquids for breakfast, and so far doing ok. She is concerned about going home too early because of her worsening pain yesterday. No BM since Monday. + flatus.   Objective:   Vital signs in last 24 hours: Temp:  [98.2 F (36.8 C)-98.6 F (37 C)] 98.2 F (36.8 C) (11/02 0355) Pulse Rate:  [100-109] 100 (11/02 0355) Resp:  [17-20] 20 (11/02 0355) BP: (136-146)/(88-102) 136/88 (11/02 0355) SpO2:  [91 %-98 %] 91 % (11/02 0355) Last BM Date : 06/04/22 General:   Alert,  Well-developed, well-nourished, pleasant and cooperative in NAD Head:  Normocephalic and atraumatic. Eyes:  Sclera clear, no icterus.  Abdomen:  Soft, nondistended. Mild luq tenderness. Normal bowel sounds, without guarding, and without rebound.   Extremities:  Without clubbing, deformity or edema. Neurologic:  Alert and  oriented x4;  grossly normal neurologically. Skin:  Intact without significant lesions or rashes. Psych:  Alert and cooperative. Normal mood and affect.  Intake/Output from previous day: 11/01 0701 - 11/02 0700 In: 5527.3 [P.O.:1080; I.V.:4447.3] Out: -  Intake/Output this shift: No intake/output data recorded.  Lab Results: CBC Recent Labs    06/05/22 0419 06/06/22 0421 06/07/22 0415  WBC 7.7 8.7 11.5*  HGB 12.9 12.9 14.0  HCT 38.0 37.4 40.1  MCV 107.0* 104.2* 102.6*  PLT 329 359 394   BMET Recent Labs    06/05/22 0419  NA 135  K 4.1  CL 99  CO2 27  GLUCOSE 86  BUN <5*  CREATININE 0.46  CALCIUM 8.8*   LFTs No results for input(s): "BILITOT",  "BILIDIR", "IBILI", "ALKPHOS", "AST", "ALT", "PROT", "ALBUMIN" in the last 72 hours. No results for input(s): "LIPASE" in the last 72 hours. PT/INR No results for input(s): "LABPROT", "INR" in the last 72 hours.       Imaging Studies: DG CHEST PORT 1 VIEW  Result Date: 06/01/2022 CLINICAL DATA:  Chest pain EXAM: PORTABLE CHEST 1 VIEW COMPARISON:  Chest radiograph 05/09/2022, CTA chest 05/11/2022 FINDINGS: The cardiomediastinal silhouette is stable. Lung volumes are low. There is small left pleural effusion with adjacent retrocardiac opacity which may reflect atelectasis or pneumonia, likely similar to the CTA from 05/11/2022 but new since the radiograph from 05/09/2022. The left upper lobe is well-aerated. The right lung is clear. There is no right pleural effusion. There is no acute osseous abnormality. IMPRESSION: Small left pleural effusion and adjacent retrocardiac opacity which may reflect atelectasis or pneumonia, likely similar to the CTA chest from 05/11/2022 but new since 05/09/2022. Electronically Signed   By: Valetta Mole M.D.   On: 06/01/2022 12:56   CT ABDOMEN PELVIS W CONTRAST  Result Date: 05/31/2022 CLINICAL DATA:  LEFT side abdominal pain since 0300 hours, acid reflux symptoms yesterday, progressively worsening pain moving to LEFT side of abdomen and radiating to chest and shoulder, associated nausea and diarrhea. History collagen vascular disease, hypertension, pancreatitis, smoking EXAM: CT ABDOMEN AND PELVIS WITH CONTRAST TECHNIQUE: Multidetector CT imaging of the abdomen and pelvis was performed using the standard protocol following bolus administration of intravenous contrast. RADIATION DOSE REDUCTION: This exam  was performed according to the departmental dose-optimization program which includes automated exposure control, adjustment of the mA and/or kV according to patient size and/or use of iterative reconstruction technique. CONTRAST:  158m OMNIPAQUE IOHEXOL 300 MG/ML SOLN  IV. No oral contrast. COMPARISON:  05/09/2022 FINDINGS: Lower chest: Lung bases clear Hepatobiliary: Gallbladder and liver normal appearance Pancreas: Enlarged edematous pancreas with diffuse infiltration of peripancreatic tissue planes consistent with acute pancreatitis, also seen on prior exam. New area of low attenuation within pancreatic tail however, approximately 3.0 x 2.6 cm, either pancreatic necrosis versus developing cystic lesion. Increased edema adjacent to stomach and spleen. Small amount of fluid in lesser sac. No pancreatic calcifications or ductal dilatation. Spleen: Normal appearance Adrenals/Urinary Tract: Adrenal glands, kidneys, ureters, and bladder normal appearance Stomach/Bowel: Normal appendix. Stomach and bowel loops otherwise unremarkable. Vascular/Lymphatic: Interval splenic vein thrombosis. Remaining vascular structures patent. Aorta normal caliber. No adenopathy. Reproductive: Unremarkable uterus and ovaries Other: Small LEFT inguinal hernia containing fat. Small amount of free fluid in pelvis. No free air. Musculoskeletal: Unremarkable IMPRESSION: Acute pancreatitis with increased surrounding peripancreatic and LEFT upper quadrant edema New area of low attenuation within pancreatic tail approximately 3.0 x 2.6 cm, either representing pancreatic necrosis or developing cystic lesion. Interval splenic vein thrombosis. Small LEFT inguinal hernia containing fat. Electronically Signed   By: MLavonia DanaM.D.   On: 05/31/2022 10:16   CT Angio Chest Pulmonary Embolism (PE) W or WO Contrast  Result Date: 05/11/2022 CLINICAL DATA:  Positive D-dimer, hypoxia.  Shortness of breath. EXAM: CT ANGIOGRAPHY CHEST WITH CONTRAST TECHNIQUE: Multidetector CT imaging of the chest was performed using the standard protocol during bolus administration of intravenous contrast. Multiplanar CT image reconstructions and MIPs were obtained to evaluate the vascular anatomy. RADIATION DOSE REDUCTION: This exam was  performed according to the departmental dose-optimization program which includes automated exposure control, adjustment of the mA and/or kV according to patient size and/or use of iterative reconstruction technique. CONTRAST:  752mOMNIPAQUE IOHEXOL 350 MG/ML SOLN COMPARISON:  None Available. FINDINGS: Cardiovascular: Satisfactory opacification of the pulmonary arteries to the segmental level. No evidence of pulmonary embolism. Normal heart size. No pericardial effusion. Mediastinum/Nodes: No enlarged mediastinal, hilar, or axillary lymph nodes. Thyroid gland, trachea, and esophagus demonstrate no significant findings. Lungs/Pleura: Moderate left and small right pleural effusions are present. There are patchy multifocal ground-glass opacities in the upper lobes, right greater than left. There is compressive atelectasis in the lingula and left lower lobe. No pneumothorax. Trachea and central airways are patent. Upper Abdomen: There is trace free fluid in the right upper quadrant. Musculoskeletal: No chest wall abnormality. No acute or significant osseous findings. Review of the MIP images confirms the above findings. IMPRESSION: 1. No evidence for pulmonary embolism. 2. Moderate left and small right pleural effusions. 3. Patchy multifocal ground-glass opacities in the upper lobes, right greater than left. Findings may be infectious/inflammatory or related to pulmonary edema. 4. Trace free fluid in the right upper quadrant of the abdomen. Electronically Signed   By: AmRonney Asters.D.   On: 05/11/2022 21:12   ECHOCARDIOGRAM COMPLETE  Result Date: 05/10/2022    ECHOCARDIOGRAM REPORT   Patient Name:   NAMAISEY DEANDRADEate of Exam: 05/10/2022 Medical Rec #:  [id number]     Height:       63.0 in Accession #:    [id number]    Weight:       190.3 lb Date of Birth:  09/10/09/1987  BSA:          1.893 m Patient Age:    36 years        BP:           166/104 mmHg Patient Gender: F               HR:           117 bpm.  Exam Location:  [facility] Procedure: 2D Echo, Cardiac Doppler and Color Doppler Indications:    Syncope  History:        Patient has no prior history of Echocardiogram examinations.                 Signs/Symptoms:Syncope; Risk Factors:Hypertension. ETOH abuse.  Sonographer:    Wenda Low Referring Phys: [id number] OLADAPO ADEFESO IMPRESSIONS  1. Difficult acoustic windows Endocardium is difficult to see in apical views. Possible basal inferior hypokinesis. Would recommend limited echo with Definity to confirm wall motion. Left ventricular ejection fraction, by estimation, is 60 to 65%. The left ventricle has normal function. There is mild left ventricular hypertrophy. Indeterminate diastolic filling due to E-A fusion.  2. Right ventricular systolic function is normal. The right ventricular size is normal.  3. The mitral valve is normal in structure. Trivial mitral valve regurgitation.  4. The aortic valve is normal in structure. Aortic valve regurgitation is not visualized.  5. The inferior vena cava is normal in size with greater than 50% respiratory variability, suggesting right atrial pressure of 3 mmHg. FINDINGS  Left Ventricle: Difficult acoustic windows Endocardium is difficult to see in apical views. Possible basal inferior hypokinesis. Would recommend limited echo with Definity to confirm wall motion. Left ventricular ejection fraction, by estimation, is 60 to 65%. The left ventricle has normal function. The left ventricular internal cavity size was normal in size. There is mild left ventricular hypertrophy. Indeterminate diastolic filling due to E-A fusion. Right Ventricle: The right ventricular size is normal. Right vetricular wall thickness was not assessed. Right ventricular systolic function is normal. Left Atrium: Left atrial size was normal in size. Right Atrium: Right atrial size was normal in size. Pericardium: There is no evidence of pericardial effusion. Mitral Valve: The mitral valve is  normal in structure. Trivial mitral valve regurgitation. MV peak gradient, 5.9 mmHg. The mean mitral valve gradient is 2.0 mmHg. Tricuspid Valve: The tricuspid valve is normal in structure. Tricuspid valve regurgitation is trivial. Aortic Valve: The aortic valve is normal in structure. Aortic valve regurgitation is not visualized. Aortic valve mean gradient measures 4.0 mmHg. Aortic valve peak gradient measures 7.7 mmHg. Aortic valve area, by VTI measures 2.85 cm. Pulmonic Valve: The pulmonic valve was normal in structure. Pulmonic valve regurgitation is trivial. Aorta: The aortic root is normal in size and structure. Venous: The inferior vena cava is normal in size with greater than 50% respiratory variability, suggesting right atrial pressure of 3 mmHg. IAS/Shunts: No atrial level shunt detected by color flow Doppler.  LEFT VENTRICLE PLAX 2D LVIDd:         4.80 cm   Diastology LVIDs:         2.90 cm   LV e' medial:    7.40 cm/s LV PW:         1.10 cm   LV E/e' medial:  7.9 LV IVS:        1.20 cm   LV e' lateral:   8.59 cm/s LVOT diam:     2.00 cm   LV E/e'  lateral: 6.8 LV SV:         66 LV SV Index:   35 LVOT Area:     3.14 cm  RIGHT VENTRICLE RV Basal diam:  2.80 cm RV Mid diam:    2.50 cm RV S prime:     20.50 cm/s TAPSE (M-mode): 2.7 cm LEFT ATRIUM             Index        RIGHT ATRIUM           Index LA diam:        3.50 cm 1.85 cm/m   RA Area:     13.40 cm LA Vol (A2C):   26.1 ml 13.79 ml/m  RA Volume:   31.40 ml  16.59 ml/m LA Vol (A4C):   30.4 ml 16.06 ml/m LA Biplane Vol: 29.2 ml 15.42 ml/m  AORTIC VALVE                    PULMONIC VALVE AV Area (Vmax):    2.49 cm     PV Vmax:       1.10 m/s AV Area (Vmean):   2.34 cm     PV Peak grad:  4.8 mmHg AV Area (VTI):     2.85 cm AV Vmax:           139.00 cm/s AV Vmean:          95.100 cm/s AV VTI:            0.230 m AV Peak Grad:      7.7 mmHg AV Mean Grad:      4.0 mmHg LVOT Vmax:         110.00 cm/s LVOT Vmean:        70.700 cm/s LVOT VTI:           0.209 m LVOT/AV VTI ratio: 0.91  AORTA Ao Root diam: 3.20 cm MITRAL VALVE MV Area (PHT): 7.82 cm     SHUNTS MV Area VTI:   3.95 cm     Systemic VTI:  0.21 m MV Peak grad:  5.9 mmHg     Systemic Diam: 2.00 cm MV Mean grad:  2.0 mmHg MV Vmax:       1.21 m/s MV Vmean:      66.3 cm/s MV Decel Time: 97 msec MV E velocity: 58.70 cm/s MV A velocity: 102.00 cm/s MV E/A ratio:  0.58 Dorris Carnes MD Electronically signed by Dorris Carnes MD Signature Date/Time: 05/10/2022/5:02:45 PM    Final    US Carotid Bilateral  Result Date: 05/10/2022 CLINICAL DATA:  Syncopal episode. History of hypertension and smoking. EXAM: BILATERAL CAROTID DUPLEX ULTRASOUND TECHNIQUE: Pearline Cables scale imaging, color Doppler and duplex ultrasound were performed of bilateral carotid and vertebral arteries in the neck. COMPARISON:  None Available. FINDINGS: Criteria: Quantification of carotid stenosis is based on velocity parameters that correlate the residual internal carotid diameter with NASCET-based stenosis levels, using the diameter of the distal internal carotid lumen as the denominator for stenosis measurement. The following velocity measurements were obtained: RIGHT ICA: 103/42 cm/sec CCA: 185/63 cm/sec SYSTOLIC ICA/CCA RATIO:  0.9 ECA: 135 cm/sec LEFT ICA: 90/32 cm/sec CCA: 149/70 cm/sec SYSTOLIC ICA/CCA RATIO:  0.8 ECA: 124 cm/sec RIGHT CAROTID ARTERY: There is no grayscale evidence of significant intimal thickening or atherosclerotic plaque affecting the interrogated portions of the right carotid system. There are no elevated peak systolic velocities within the interrogated course of the right internal carotid artery to  suggest a hemodynamically significant stenosis. RIGHT VERTEBRAL ARTERY:  Antegrade flow LEFT CAROTID ARTERY: There is no grayscale evidence of significant intimal thickening or atherosclerotic plaque affecting the interrogated portions of the left carotid system. There are no elevated peak systolic velocities within the  interrogated course of the left internal carotid artery to suggest a hemodynamically significant stenosis. LEFT VERTEBRAL ARTERY:  Antegrade flow IMPRESSION: Unremarkable carotid Doppler ultrasound. Electronically Signed   By: Sandi Mariscal M.D.   On: 05/10/2022 13:23   US Abdomen Limited RUQ (LIVER/GB)  Result Date: 05/10/2022 CLINICAL DATA:  Pancreatitis EXAM: ULTRASOUND ABDOMEN LIMITED RIGHT UPPER QUADRANT COMPARISON:  CT 05/09/2022 FINDINGS: Gallbladder: No gallstones or wall thickening visualized. No sonographic Murphy sign noted by sonographer. Common bile duct: Diameter: 3 mm. Liver: Mildly increased hepatic parenchymal echogenicity. There is an ovoid area within the posterior left hepatic lobe of relatively increased parenchymal echogenicity measuring 3.1 x 2.3 x 3.3 cm, which does demonstrate vascularity on color Doppler. Portal vein is patent on color Doppler imaging with normal direction of blood flow towards the liver. Other: None. IMPRESSION: 1. The liver is mildly increased in echogenicity. This is a nonspecific finding but is most commonly seen with fatty infiltration of the liver. There is an ovoid area of relatively increased parenchymal echogenicity within the posterior left hepatic lobe measuring up to 3.3 cm. This is favored to represent area of more focal fatty infiltration, however a mass is not excluded. Recommend further evaluation with nonemergent MRI of the abdomen with and without contrast. 2. Unremarkable sonographic appearance of the gallbladder. Electronically Signed   By: Davina Poke D.O.   On: 05/10/2022 09:04   CT ABDOMEN PELVIS W CONTRAST  Result Date: 05/09/2022 CLINICAL DATA:  Acute generalized abdominal pain. EXAM: CT ABDOMEN AND PELVIS WITH CONTRAST TECHNIQUE: Multidetector CT imaging of the abdomen and pelvis was performed using the standard protocol following bolus administration of intravenous contrast. RADIATION DOSE REDUCTION: This exam was performed according to  the departmental dose-optimization program which includes automated exposure control, adjustment of the mA and/or kV according to patient size and/or use of iterative reconstruction technique. CONTRAST:  122m OMNIPAQUE IOHEXOL 300 MG/ML  SOLN COMPARISON:  August 08, 2020. FINDINGS: Lower chest: No acute abnormality. Hepatobiliary: No focal liver abnormality is seen. No gallstones, gallbladder wall thickening, or biliary dilatation. Pancreas: Large amount of inflammatory stranding is noted around the pancreas consistent with acute pancreatitis. No definite pseudocyst formation or ductal dilatation is noted. Spleen: Normal in size without focal abnormality. Adrenals/Urinary Tract: Adrenal glands are unremarkable. Kidneys are normal, without renal calculi, focal lesion, or hydronephrosis. Bladder is unremarkable. Stomach/Bowel: Stomach is within normal limits. Appendix appears normal. No evidence of bowel wall thickening, distention, or inflammatory changes. Vascular/Lymphatic: No significant vascular findings are present. No enlarged abdominal or pelvic lymph nodes. Reproductive: Uterus and bilateral adnexa are unremarkable. Other: No abdominal wall hernia or abnormality. No abdominopelvic ascites. Musculoskeletal: No acute or significant osseous findings. IMPRESSION: Findings consistent with acute pancreatitis. No definite pseudocyst formation is noted. Electronically Signed   By: JMarijo ConceptionM.D.   On: 05/09/2022 18:19   DG Chest 2 View  Result Date: 05/09/2022 CLINICAL DATA:  Chest pain EXAM: CHEST - 2 VIEW COMPARISON:  None Available. FINDINGS: The heart size and mediastinal contours are within normal limits. Streaky opacity within the lingula. Lungs are otherwise clear. No pleural effusion or pneumothorax. The visualized skeletal structures are unremarkable. IMPRESSION: Streaky opacity within the lingula, which may represent atelectasis versus developing  infiltrate. Electronically Signed   By: Davina Poke D.O.   On: 05/09/2022 13:30  [2 weeks]  Assessment:   Kadince Boxley is a 36 y.o. female with a history of alcohol use, HTN, Liddle syndrome, B12/folate deficiency, prior episodes of pancreatitis presenting with recurrent pancreatitis. GI consulted for further evaluation and management.    Pancreatitis: suspected secondary to ETOH. Patient states her last etoh use was one month ago. CT A/p this admission with area of low attentuation concerning for necrosis vs. cystic lesion. Course complicated by newly developed splenic vein thrombosis. Diet advanced to full liquids which patient tolerated well, but with soft diet she began having increased pain and nausea. Has since only had one meal, at breakfast, and tolerated full liquids much better. L shoulder pain resolved. Patient reluctant for discharge since she had worsening pain yesterday. Using oral pain medication, alternating with dilaudid.    Will need CT pancreatic protocol in 6-8 weeks for further evaluation of pancreatic lesions, as above.   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 Started on Eliquis '10mg'$  BID yesterday.    Plan:   Complete etoh cessation. CT pancreatic protocol in 6-8 weeks.  Eliquis for splenic vein thrombosis.  Full liquids for today. Slowly progress to soft low fat diet as tolerated. Hopefully home in the next 24 hours.    LOS: 6 days   Laureen Ochs. Bernarda Caffey [facility] [phone] 11/2/20238:47 AM

## 2022-06-08 ENCOUNTER — Inpatient Hospital Stay (HOSPITAL_COMMUNITY): Payer: 59

## 2022-06-08 DIAGNOSIS — S36029A Unspecified contusion of spleen, initial encounter: Secondary | ICD-10-CM

## 2022-06-08 DIAGNOSIS — K858 Other acute pancreatitis without necrosis or infection: Secondary | ICD-10-CM

## 2022-06-08 DIAGNOSIS — K859 Acute pancreatitis without necrosis or infection, unspecified: Secondary | ICD-10-CM | POA: Diagnosis not present

## 2022-06-08 LAB — BASIC METABOLIC PANEL WITH GFR
Anion gap: 15 (ref 5–15)
BUN: 8 mg/dL (ref 6–20)
CO2: 23 mmol/L (ref 22–32)
Calcium: 8.9 mg/dL (ref 8.9–10.3)
Chloride: 95 mmol/L — ABNORMAL LOW (ref 98–111)
Creatinine, Ser: 0.48 mg/dL (ref 0.44–1.00)
GFR, Estimated: >60 mL/min
Glucose, Bld: 196 mg/dL — ABNORMAL HIGH (ref 70–99)
Potassium: 4.1 mmol/L (ref 3.5–5.1)
Sodium: 133 mmol/L — ABNORMAL LOW (ref 135–145)

## 2022-06-08 LAB — HEMOGLOBIN AND HEMATOCRIT, BLOOD
HCT: 36.5 % (ref 36.0–46.0)
HCT: 35.8 % — ABNORMAL LOW (ref 36.0–46.0)
Hemoglobin: 12.8 g/dL (ref 12.0–15.0)
Hemoglobin: 12.6 g/dL (ref 12.0–15.0)

## 2022-06-08 LAB — CBC
HCT: 38.6 % (ref 36.0–46.0)
Hemoglobin: 13.3 g/dL (ref 12.0–15.0)
MCH: 35.5 pg — ABNORMAL HIGH (ref 26.0–34.0)
MCHC: 34.5 g/dL (ref 30.0–36.0)
MCV: 102.9 fL — ABNORMAL HIGH (ref 80.0–100.0)
Platelets: 503 10*3/uL — ABNORMAL HIGH (ref 150–400)
RBC: 3.75 MIL/uL — ABNORMAL LOW (ref 3.87–5.11)
RDW: 15.7 % — ABNORMAL HIGH (ref 11.5–15.5)
WBC: 22.6 10*3/uL — ABNORMAL HIGH (ref 4.0–10.5)
nRBC: 0 % (ref 0.0–0.2)

## 2022-06-08 LAB — C-REACTIVE PROTEIN: CRP: 24.6 mg/dL — ABNORMAL HIGH

## 2022-06-08 LAB — MRSA NEXT GEN BY PCR, NASAL: MRSA by PCR Next Gen: NOT DETECTED

## 2022-06-08 LAB — PREPARE RBC (CROSSMATCH)

## 2022-06-08 LAB — ABO/RH: ABO/RH(D): O POS

## 2022-06-08 LAB — PROCALCITONIN: Procalcitonin: 0.29 ng/mL

## 2022-06-08 MED ORDER — SODIUM CHLORIDE 0.9 % IV SOLN: INTRAVENOUS | Status: DC

## 2022-06-08 MED ORDER — KETOROLAC TROMETHAMINE 30 MG/ML IJ SOLN
30.0000 mg | Freq: Once | INTRAMUSCULAR | Status: AC
  Administered 2022-06-08: 30 mg via INTRAVENOUS
  Filled 2022-06-08: qty 1

## 2022-06-08 MED ORDER — IOHEXOL 300 MG/ML  SOLN
100.0000 mL | Freq: Once | INTRAMUSCULAR | Status: AC | PRN
  Administered 2022-06-08: 100 mL via INTRAVENOUS

## 2022-06-08 MED ORDER — SODIUM CHLORIDE 0.9% IV SOLUTION: Freq: Once | INTRAVENOUS | Status: DC

## 2022-06-08 MED ORDER — CHLORHEXIDINE GLUCONATE CLOTH 2 % EX PADS
6.0000 | MEDICATED_PAD | Freq: Every day | CUTANEOUS | Status: DC
  Administered 2022-06-08 – 2022-06-14 (×7): 6 via TOPICAL

## 2022-06-08 MED ORDER — HYDROMORPHONE HCL 1 MG/ML IJ SOLN
0.5000 mg | Freq: Once | INTRAMUSCULAR | Status: AC
  Administered 2022-06-08: 0.5 mg via INTRAVENOUS
  Filled 2022-06-08: qty 1

## 2022-06-08 MED ORDER — DIPHENHYDRAMINE HCL 12.5 MG/5ML PO ELIX: 12.5000 mg | ORAL_SOLUTION | Freq: Four times a day (QID) | ORAL | Status: DC | PRN

## 2022-06-08 MED ORDER — BISACODYL 10 MG RE SUPP
10.0000 mg | Freq: Once | RECTAL | Status: AC
  Administered 2022-06-08: 10 mg via RECTAL
  Filled 2022-06-08: qty 1

## 2022-06-08 MED ORDER — ONDANSETRON HCL 4 MG/2ML IJ SOLN: 4.0000 mg | Freq: Four times a day (QID) | INTRAMUSCULAR | Status: DC | PRN

## 2022-06-08 MED ORDER — DIPHENHYDRAMINE HCL 50 MG/ML IJ SOLN: 12.5000 mg | Freq: Four times a day (QID) | INTRAMUSCULAR | Status: DC | PRN

## 2022-06-08 MED ORDER — HYDROMORPHONE 1 MG/ML IV SOLN
INTRAVENOUS | Status: DC
  Administered 2022-06-09: 2 mg via INTRAVENOUS
  Administered 2022-06-09: 1 mg via INTRAVENOUS
  Administered 2022-06-10: 3 mg via INTRAVENOUS
  Filled 2022-06-08 (×2): qty 30

## 2022-06-08 MED ORDER — NALOXONE HCL 0.4 MG/ML IJ SOLN: 0.4000 mg | INTRAMUSCULAR | Status: DC | PRN

## 2022-06-08 MED ORDER — SODIUM CHLORIDE 0.9% FLUSH: 9.0000 mL | INTRAVENOUS | Status: DC | PRN

## 2022-06-08 MED ORDER — LACTATED RINGERS IV SOLN: INTRAVENOUS | Status: DC

## 2022-06-08 MED ORDER — PROTHROMBIN COMPLEX CONC HUMAN 500 UNITS IV KIT
4203.0000 [IU] | PACK | Status: AC
  Administered 2022-06-08: 4203 [IU] via INTRAVENOUS
  Filled 2022-06-08: qty 4203

## 2022-06-08 NOTE — Progress Notes (Signed)
[facility]  Received epic messages several hours after being in the OR. Patient with pancreatitis, splenic vein thrombosis placed on Eliquis with a new atraumatic splenic hematoma, pancreatic pseudocyst on CT.  She has a stable H&H and vitals show hypertension and some tachycardia.  The hospitalist and GI team have reversed her. I am the only surgeon at [facility] and in between [facility] cases currently. Will try to get up to see patient shortly, but she need to be type and crossed pending any need for blood.  I think given the weekend and limited resources at [facility] she needs to be transferred to [facility] ASAP.  I have let the hospitalist team know this when I received the Epic messages. Transfer has been set up.    I spoke with Jerene Pitch one of the [facility] PA to just let them know she is coming down.  Based on my brief literature search regarding atraumatic hematomas in pancreatitis these are managed non operatively versus embolization in some cases.  Being monitored at [facility] will be the better option given limited blood resource and potential need for IR.  Curlene Labrum, MD [facility] [address] [place], [place] [address] [phone] (office)

## 2022-06-08 NOTE — Progress Notes (Signed)
PROGRESS NOTE    Jillian Shepherd  PFX:[id number] DOB: 04-27-1986 DOA: 05/31/2022 PCP: [name], MD   Brief Narrative:    36 year old female with history of previous alcohol use, was admitted to the hospital earlier this month for acute alcoholic pancreatitis.  She was readmitted to the hospital on 10/26 with worsening abdominal pain.  CT showed persistent pancreatitis and splenic vein thrombosis.  Started on IV fluids, pain management and anticoagulation.  GI following.    Assessment & Plan:   Principal Problem:   Acute pancreatitis Active Problems:   Essential hypertension   Diarrhea   Splenic vein thrombosis   Liddle's syndrome  Assessment and Plan:   Acute on chronic pancreatitis-with worsening abdominal pain -Patient reports that she has abstained from alcohol since her last discharge -Worsening pain may be related to splenic vein thrombosis which is likely a sequelae of pancreatitis -Appreciate ongoingGI input with plans to repeat CT abdomen and consider initiation of antibiotics pending results. -Noted to have some clinical deterioration with worsening leukocytosis -Patient will be n.p.o. again and start aggressive IV fluid hydration as well as pain management   Acute splenic vein thrombosis -Currently on Eliquis   Hypertension -Continue on Norvasc   Liddle's syndrome -Chronically on spironolactone and potassium supplementation -We will hold off on Aldactone for now and replace potassium as needed     DVT prophylaxis: Eliquis   Code Status: full code Family Communication: discussed with patient  Disposition Plan: Status is: Inpatient Remains inpatient appropriate because: continued pain management for pancreatitis     Consultants:  Gastroenterology   Procedures:      Antimicrobials:   None    Subjective: Patient seen and evaluated today with overall worsening abdominal pain noted with some ongoing nausea and vomiting  overnight.  Objective: Vitals:   06/07/22 0355 06/07/22 1417 06/07/22 1504 06/07/22 2052  BP: 136/88 (!) 132/98 136/89 (!) 132/93  Pulse: 100 (!) 128 100 (!) 101  Resp: '20 20  18  '$ Temp: 98.2 F (36.8 C) 98 F (36.7 C)  98.6 F (37 C)  TempSrc: Oral Oral  Oral  SpO2: 91% 91%  90%  Weight:      Height:        Intake/Output Summary (Last 24 hours) at 06/08/2022 1114 Last data filed at 06/08/2022 0330 Gross per 24 hour  Intake 418.53 ml  Output --  Net 418.53 ml   Filed Weights   05/31/22 0824  Weight: 83.6 kg    Examination:  General exam: Appears to be in moderate distress Respiratory system: Clear to auscultation. Respiratory effort normal. Cardiovascular system: S1 & S2 heard, RRR.  Gastrointestinal system: Abdomen is soft Central nervous system: Alert and awake Extremities: No edema Skin: No significant lesions noted Psychiatry: Flat affect.    Data Reviewed: I have personally reviewed following labs and imaging studies  CBC: Recent Labs  Lab 06/04/22 0533 06/05/22 0419 06/06/22 0421 06/07/22 0415 06/08/22 0438  WBC 8.7 7.7 8.7 11.5* 22.6*  HGB 12.8 12.9 12.9 14.0 13.3  HCT 36.9 38.0 37.4 40.1 38.6  MCV 105.7* 107.0* 104.2* 102.6* 102.9*  PLT 330 329 359 394 329*   Basic Metabolic Panel: Recent Labs  Lab 06/02/22 0441 06/05/22 0419 06/08/22 0438  NA 132* 135 133*  K 4.3 4.1 4.1  CL 101 99 95*  CO2 '24 27 23  '$ GLUCOSE 117* 86 196*  BUN 10 <5* 8  CREATININE 0.41* 0.46 0.48  CALCIUM 8.6* 8.8* 8.9   GFR: Estimated Creatinine  Clearance: 98.6 mL/min (by C-G formula based on SCr of 0.48 mg/dL). Liver Function Tests: No results for input(s): "AST", "ALT", "ALKPHOS", "BILITOT", "PROT", "ALBUMIN" in the last 168 hours. No results for input(s): "LIPASE", "AMYLASE" in the last 168 hours. No results for input(s): "AMMONIA" in the last 168 hours. Coagulation Profile: No results for input(s): "INR", "PROTIME" in the last 168 hours. Cardiac Enzymes: No  results for input(s): "CKTOTAL", "CKMB", "CKMBINDEX", "TROPONINI" in the last 168 hours. BNP (last 3 results) No results for input(s): "PROBNP" in the last 8760 hours. HbA1C: No results for input(s): "HGBA1C" in the last 72 hours. CBG: No results for input(s): "GLUCAP" in the last 168 hours. Lipid Profile: No results for input(s): "CHOL", "HDL", "LDLCALC", "TRIG", "CHOLHDL", "LDLDIRECT" in the last 72 hours. Thyroid Function Tests: No results for input(s): "TSH", "T4TOTAL", "FREET4", "T3FREE", "THYROIDAB" in the last 72 hours. Anemia Panel: No results for input(s): "VITAMINB12", "FOLATE", "FERRITIN", "TIBC", "IRON", "RETICCTPCT" in the last 72 hours. Sepsis Labs: Recent Labs  Lab 06/08/22 0438  PROCALCITON 0.29    No results found for this or any previous visit (from the past 240 hour(s)).       Radiology Studies: No results found.      Scheduled Meds:  amLODipine  10 mg Oral Daily   apixaban  10 mg Oral BID   Followed by   Derrill Memo ON 06/12/2022] apixaban  5 mg Oral BID   folic acid  1 mg Oral Daily   influenza vac split quadrivalent PF  0.5 mL Intramuscular Tomorrow-1000   potassium chloride SA  60 mEq Oral Daily   Continuous Infusions:  sodium chloride 125 mL/hr at 06/08/22 1035     LOS: 7 days    Time spent: 35 minutes    Saina Waage Darleen Crocker, DO [facility]  If 7PM-7AM, please contact night-coverage [email or web address] 06/08/2022, 11:14 AM

## 2022-06-08 NOTE — Progress Notes (Signed)
   06/08/22 1235  Vitals  Temp 98.5 F (36.9 C)  Temp Source Oral  BP (!) 167/115  MAP (mmHg) 130  BP Location Right Arm  BP Method Automatic  Patient Position (if appropriate) Lying  Pulse Rate (!) 125  Pulse Rate Source Monitor  Resp 16  MEWS COLOR  MEWS Score Color Yellow  Oxygen Therapy  SpO2 94 %  O2 Device Nasal Cannula  O2 Flow Rate (L/min) 2 L/min  MEWS Score  MEWS Temp 0  MEWS Systolic 0  MEWS Pulse 2  MEWS RR 0  MEWS LOC 0  MEWS Score 2  Provider Notification  Provider Name/Title Dr. Manuella Ghazi  Date Provider Notified 06/08/22  Time Provider Notified [phone]

## 2022-06-08 NOTE — Progress Notes (Signed)
Report called and given to [place]. [facility] has picked up patient for transport.

## 2022-06-08 NOTE — Progress Notes (Signed)
CT imaging has returned with noted splenic hematoma as well as new pancreatic pseudocyst.  Procalcitonin negative, but patient is having worsening leukocytosis that is likely reactive in the setting of this worsening hematoma.  She has been administered Kcentra for reversal of Eliquis and repeat H/H shows stability and vital signs are currently stable.  She has been moved to stepdown unit.  Case discussed with general surgery, Dr. Constance Haw who recommends transfer to [facility] for further evaluation by GI and general surgery.  Plan to monitor H/H every 6 hours for now and transfuse for hemoglobin less than 7.  Continue aggressive IV fluid and n.p.o. status.  Total critical care time: 40 minutes.

## 2022-06-08 NOTE — Progress Notes (Signed)
Pt's spouse called with updated plan of care.

## 2022-06-08 NOTE — Progress Notes (Signed)
Pt has complained of pain most of the shift 10/10 in her ABD, also complains of nausea and has had 2x emesis episodes, pt also complains of the taste of "poop" in her mouth and requests a shower, notified Dr. Josephine Cables, no shower order at this time d/t fall risk no other orders at this time.

## 2022-06-08 NOTE — Progress Notes (Signed)
Gastroenterology Progress Note   Referring Provider: No ref. provider found Primary Care Physician:  [name], MD Primary Gastroenterologist:  Dr. Marius Ditch ([facility])  Patient ID: Jillian Shepherd; [id number]; 11-16-1985    Subjective   Worsening abdominal pain overnight reported as 10/10. Generalized throughout her abdomen but most notable to LUQ and radiating to her back.  Passing little flatus.  No bowel movement since Monday.  Began having worsening nausea with little vomiting and reports she has been diaphoretic.  Did not tolerate liquids overnight.   Objective   Vital signs in last 24 hours Temp:  [98 F (36.7 C)-98.6 F (37 C)] 98.6 F (37 C) (11/02 2052) Pulse Rate:  [100-128] 101 (11/02 2052) Resp:  [18-20] 18 (11/02 2052) BP: (132-136)/(89-98) 132/93 (11/02 2052) SpO2:  [90 %-91 %] 90 % (11/02 2052) Last BM Date : 06/04/22  Physical Exam General:   Alert and oriented. Head:  Normocephalic and atraumatic. Eyes:  No icterus, sclera clear. Conjuctiva pink.  Mouth:  Without lesions, mucosa pink and moist.  Neck:  Supple, without thyromegaly or masses.  Abdomen:  Bowel sounds distant and hypoactive, soft, non-distended.  Mild tenderness to lower abdomen, increased tenderness to left upper quadrant and left lateral abdomen.  No HSM or hernias noted. No rebound or guarding. No masses appreciated  Msk:  Symmetrical without gross deformities. Normal posture. Extremities:  Without clubbing or edema. Neurologic:  Alert and  oriented x4;  grossly normal neurologically. Skin:  Warm and dry, intact without significant lesions.  Psych:  Alert and cooperative.  Intake/Output from previous day: 11/02 0701 - 11/03 0700 In: 682.7 [P.O.:120; I.V.:562.7] Out: -  Intake/Output this shift: No intake/output data recorded.  Lab Results  Recent Labs    06/06/22 0421 06/07/22 0415 06/08/22 0438  WBC 8.7 11.5* 22.6*  HGB 12.9 14.0 13.3  HCT 37.4 40.1 38.6  PLT 359 394  503*   BMET Recent Labs    06/08/22 0438  NA 133*  K 4.1  CL 95*  CO2 23  GLUCOSE 196*  BUN 8  CREATININE 0.48  CALCIUM 8.9   LFT No results for input(s): "PROT", "ALBUMIN", "AST", "ALT", "ALKPHOS", "BILITOT", "BILIDIR", "IBILI" in the last 72 hours. PT/INR No results for input(s): "LABPROT", "INR" in the last 72 hours. Hepatitis Panel No results for input(s): "HEPBSAG", "HCVAB", "HEPAIGM", "HEPBIGM" in the last 72 hours.  Studies/Results DG CHEST PORT 1 VIEW  Result Date: 06/01/2022 CLINICAL DATA:  Chest pain EXAM: PORTABLE CHEST 1 VIEW COMPARISON:  Chest radiograph 05/09/2022, CTA chest 05/11/2022 FINDINGS: The cardiomediastinal silhouette is stable. Lung volumes are low. There is small left pleural effusion with adjacent retrocardiac opacity which may reflect atelectasis or pneumonia, likely similar to the CTA from 05/11/2022 but new since the radiograph from 05/09/2022. The left upper lobe is well-aerated. The right lung is clear. There is no right pleural effusion. There is no acute osseous abnormality. IMPRESSION: Small left pleural effusion and adjacent retrocardiac opacity which may reflect atelectasis or pneumonia, likely similar to the CTA chest from 05/11/2022 but new since 05/09/2022. Electronically Signed   By: Valetta Mole M.D.   On: 06/01/2022 12:56   CT ABDOMEN PELVIS W CONTRAST  Result Date: 05/31/2022 CLINICAL DATA:  LEFT side abdominal pain since 0300 hours, acid reflux symptoms yesterday, progressively worsening pain moving to LEFT side of abdomen and radiating to chest and shoulder, associated nausea and diarrhea. History collagen vascular disease, hypertension, pancreatitis, smoking EXAM: CT ABDOMEN AND PELVIS WITH CONTRAST  TECHNIQUE: Multidetector CT imaging of the abdomen and pelvis was performed using the standard protocol following bolus administration of intravenous contrast. RADIATION DOSE REDUCTION: This exam was performed according to the departmental  dose-optimization program which includes automated exposure control, adjustment of the mA and/or kV according to patient size and/or use of iterative reconstruction technique. CONTRAST:  138m OMNIPAQUE IOHEXOL 300 MG/ML SOLN IV. No oral contrast. COMPARISON:  05/09/2022 FINDINGS: Lower chest: Lung bases clear Hepatobiliary: Gallbladder and liver normal appearance Pancreas: Enlarged edematous pancreas with diffuse infiltration of peripancreatic tissue planes consistent with acute pancreatitis, also seen on prior exam. New area of low attenuation within pancreatic tail however, approximately 3.0 x 2.6 cm, either pancreatic necrosis versus developing cystic lesion. Increased edema adjacent to stomach and spleen. Small amount of fluid in lesser sac. No pancreatic calcifications or ductal dilatation. Spleen: Normal appearance Adrenals/Urinary Tract: Adrenal glands, kidneys, ureters, and bladder normal appearance Stomach/Bowel: Normal appendix. Stomach and bowel loops otherwise unremarkable. Vascular/Lymphatic: Interval splenic vein thrombosis. Remaining vascular structures patent. Aorta normal caliber. No adenopathy. Reproductive: Unremarkable uterus and ovaries Other: Small LEFT inguinal hernia containing fat. Small amount of free fluid in pelvis. No free air. Musculoskeletal: Unremarkable IMPRESSION: Acute pancreatitis with increased surrounding peripancreatic and LEFT upper quadrant edema New area of low attenuation within pancreatic tail approximately 3.0 x 2.6 cm, either representing pancreatic necrosis or developing cystic lesion. Interval splenic vein thrombosis. Small LEFT inguinal hernia containing fat. Electronically Signed   By: MLavonia DanaM.D.   On: 05/31/2022 10:16   CT Angio Chest Pulmonary Embolism (PE) W or WO Contrast  Result Date: 05/11/2022 CLINICAL DATA:  Positive D-dimer, hypoxia.  Shortness of breath. EXAM: CT ANGIOGRAPHY CHEST WITH CONTRAST TECHNIQUE: Multidetector CT imaging of the chest was  performed using the standard protocol during bolus administration of intravenous contrast. Multiplanar CT image reconstructions and MIPs were obtained to evaluate the vascular anatomy. RADIATION DOSE REDUCTION: This exam was performed according to the departmental dose-optimization program which includes automated exposure control, adjustment of the mA and/or kV according to patient size and/or use of iterative reconstruction technique. CONTRAST:  73mOMNIPAQUE IOHEXOL 350 MG/ML SOLN COMPARISON:  None Available. FINDINGS: Cardiovascular: Satisfactory opacification of the pulmonary arteries to the segmental level. No evidence of pulmonary embolism. Normal heart size. No pericardial effusion. Mediastinum/Nodes: No enlarged mediastinal, hilar, or axillary lymph nodes. Thyroid gland, trachea, and esophagus demonstrate no significant findings. Lungs/Pleura: Moderate left and small right pleural effusions are present. There are patchy multifocal ground-glass opacities in the upper lobes, right greater than left. There is compressive atelectasis in the lingula and left lower lobe. No pneumothorax. Trachea and central airways are patent. Upper Abdomen: There is trace free fluid in the right upper quadrant. Musculoskeletal: No chest wall abnormality. No acute or significant osseous findings. Review of the MIP images confirms the above findings. IMPRESSION: 1. No evidence for pulmonary embolism. 2. Moderate left and small right pleural effusions. 3. Patchy multifocal ground-glass opacities in the upper lobes, right greater than left. Findings may be infectious/inflammatory or related to pulmonary edema. 4. Trace free fluid in the right upper quadrant of the abdomen. Electronically Signed   By: AmRonney Asters.D.   On: 05/11/2022 21:12   ECHOCARDIOGRAM COMPLETE  Result Date: 05/10/2022    ECHOCARDIOGRAM REPORT   Patient Name:   NACHANNING SAVICHate of Exam: 05/10/2022 Medical Rec #:  [id number]     Height:       63.0 in  Accession #:  [id number]      Weight:       190.3 lb Date of Birth:  10-24-1985       BSA:          1.893 m Patient Age:    36 years        BP:           166/104 mmHg Patient Gender: F               HR:           117 bpm. Exam Location:  [facility] Procedure: 2D Echo, Cardiac Doppler and Color Doppler Indications:    Syncope  History:        Patient has no prior history of Echocardiogram examinations.                 Signs/Symptoms:Syncope; Risk Factors:Hypertension. ETOH abuse.  Sonographer:    Wenda Low Referring Phys: [id number] OLADAPO ADEFESO IMPRESSIONS  1. Difficult acoustic windows Endocardium is difficult to see in apical views. Possible basal inferior hypokinesis. Would recommend limited echo with Definity to confirm wall motion. Left ventricular ejection fraction, by estimation, is 60 to 65%. The left ventricle has normal function. There is mild left ventricular hypertrophy. Indeterminate diastolic filling due to E-A fusion.  2. Right ventricular systolic function is normal. The right ventricular size is normal.  3. The mitral valve is normal in structure. Trivial mitral valve regurgitation.  4. The aortic valve is normal in structure. Aortic valve regurgitation is not visualized.  5. The inferior vena cava is normal in size with greater than 50% respiratory variability, suggesting right atrial pressure of 3 mmHg. FINDINGS  Left Ventricle: Difficult acoustic windows Endocardium is difficult to see in apical views. Possible basal inferior hypokinesis. Would recommend limited echo with Definity to confirm wall motion. Left ventricular ejection fraction, by estimation, is 60 to 65%. The left ventricle has normal function. The left ventricular internal cavity size was normal in size. There is mild left ventricular hypertrophy. Indeterminate diastolic filling due to E-A fusion. Right Ventricle: The right ventricular size is normal. Right vetricular wall thickness was not assessed. Right ventricular  systolic function is normal. Left Atrium: Left atrial size was normal in size. Right Atrium: Right atrial size was normal in size. Pericardium: There is no evidence of pericardial effusion. Mitral Valve: The mitral valve is normal in structure. Trivial mitral valve regurgitation. MV peak gradient, 5.9 mmHg. The mean mitral valve gradient is 2.0 mmHg. Tricuspid Valve: The tricuspid valve is normal in structure. Tricuspid valve regurgitation is trivial. Aortic Valve: The aortic valve is normal in structure. Aortic valve regurgitation is not visualized. Aortic valve mean gradient measures 4.0 mmHg. Aortic valve peak gradient measures 7.7 mmHg. Aortic valve area, by VTI measures 2.85 cm. Pulmonic Valve: The pulmonic valve was normal in structure. Pulmonic valve regurgitation is trivial. Aorta: The aortic root is normal in size and structure. Venous: The inferior vena cava is normal in size with greater than 50% respiratory variability, suggesting right atrial pressure of 3 mmHg. IAS/Shunts: No atrial level shunt detected by color flow Doppler.  LEFT VENTRICLE PLAX 2D LVIDd:         4.80 cm   Diastology LVIDs:         2.90 cm   LV e' medial:    7.40 cm/s LV PW:         1.10 cm   LV E/e' medial:  7.9 LV IVS:  1.20 cm   LV e' lateral:   8.59 cm/s LVOT diam:     2.00 cm   LV E/e' lateral: 6.8 LV SV:         66 LV SV Index:   35 LVOT Area:     3.14 cm  RIGHT VENTRICLE RV Basal diam:  2.80 cm RV Mid diam:    2.50 cm RV S prime:     20.50 cm/s TAPSE (M-mode): 2.7 cm LEFT ATRIUM             Index        RIGHT ATRIUM           Index LA diam:        3.50 cm 1.85 cm/m   RA Area:     13.40 cm LA Vol (A2C):   26.1 ml 13.79 ml/m  RA Volume:   31.40 ml  16.59 ml/m LA Vol (A4C):   30.4 ml 16.06 ml/m LA Biplane Vol: 29.2 ml 15.42 ml/m  AORTIC VALVE                    PULMONIC VALVE AV Area (Vmax):    2.49 cm     PV Vmax:       1.10 m/s AV Area (Vmean):   2.34 cm     PV Peak grad:  4.8 mmHg AV Area (VTI):     2.85 cm AV  Vmax:           139.00 cm/s AV Vmean:          95.100 cm/s AV VTI:            0.230 m AV Peak Grad:      7.7 mmHg AV Mean Grad:      4.0 mmHg LVOT Vmax:         110.00 cm/s LVOT Vmean:        70.700 cm/s LVOT VTI:          0.209 m LVOT/AV VTI ratio: 0.91  AORTA Ao Root diam: 3.20 cm MITRAL VALVE MV Area (PHT): 7.82 cm     SHUNTS MV Area VTI:   3.95 cm     Systemic VTI:  0.21 m MV Peak grad:  5.9 mmHg     Systemic Diam: 2.00 cm MV Mean grad:  2.0 mmHg MV Vmax:       1.21 m/s MV Vmean:      66.3 cm/s MV Decel Time: 97 msec MV E velocity: 58.70 cm/s MV A velocity: 102.00 cm/s MV E/A ratio:  0.58 Dorris Carnes MD Electronically signed by Dorris Carnes MD Signature Date/Time: 05/10/2022/5:02:45 PM    Final    US Carotid Bilateral  Result Date: 05/10/2022 CLINICAL DATA:  Syncopal episode. History of hypertension and smoking. EXAM: BILATERAL CAROTID DUPLEX ULTRASOUND TECHNIQUE: Pearline Cables scale imaging, color Doppler and duplex ultrasound were performed of bilateral carotid and vertebral arteries in the neck. COMPARISON:  None Available. FINDINGS: Criteria: Quantification of carotid stenosis is based on velocity parameters that correlate the residual internal carotid diameter with NASCET-based stenosis levels, using the diameter of the distal internal carotid lumen as the denominator for stenosis measurement. The following velocity measurements were obtained: RIGHT ICA: 103/42 cm/sec CCA: 449/67 cm/sec SYSTOLIC ICA/CCA RATIO:  0.9 ECA: 135 cm/sec LEFT ICA: 90/32 cm/sec CCA: 591/63 cm/sec SYSTOLIC ICA/CCA RATIO:  0.8 ECA: 124 cm/sec RIGHT CAROTID ARTERY: There is no grayscale evidence of significant intimal thickening or atherosclerotic plaque affecting the interrogated portions  of the right carotid system. There are no elevated peak systolic velocities within the interrogated course of the right internal carotid artery to suggest a hemodynamically significant stenosis. RIGHT VERTEBRAL ARTERY:  Antegrade flow LEFT CAROTID ARTERY:  There is no grayscale evidence of significant intimal thickening or atherosclerotic plaque affecting the interrogated portions of the left carotid system. There are no elevated peak systolic velocities within the interrogated course of the left internal carotid artery to suggest a hemodynamically significant stenosis. LEFT VERTEBRAL ARTERY:  Antegrade flow IMPRESSION: Unremarkable carotid Doppler ultrasound. Electronically Signed   By: Sandi Mariscal M.D.   On: 05/10/2022 13:23   US Abdomen Limited RUQ (LIVER/GB)  Result Date: 05/10/2022 CLINICAL DATA:  Pancreatitis EXAM: ULTRASOUND ABDOMEN LIMITED RIGHT UPPER QUADRANT COMPARISON:  CT 05/09/2022 FINDINGS: Gallbladder: No gallstones or wall thickening visualized. No sonographic Murphy sign noted by sonographer. Common bile duct: Diameter: 3 mm. Liver: Mildly increased hepatic parenchymal echogenicity. There is an ovoid area within the posterior left hepatic lobe of relatively increased parenchymal echogenicity measuring 3.1 x 2.3 x 3.3 cm, which does demonstrate vascularity on color Doppler. Portal vein is patent on color Doppler imaging with normal direction of blood flow towards the liver. Other: None. IMPRESSION: 1. The liver is mildly increased in echogenicity. This is a nonspecific finding but is most commonly seen with fatty infiltration of the liver. There is an ovoid area of relatively increased parenchymal echogenicity within the posterior left hepatic lobe measuring up to 3.3 cm. This is favored to represent area of more focal fatty infiltration, however a mass is not excluded. Recommend further evaluation with nonemergent MRI of the abdomen with and without contrast. 2. Unremarkable sonographic appearance of the gallbladder. Electronically Signed   By: Davina Poke D.O.   On: 05/10/2022 09:04   CT ABDOMEN PELVIS W CONTRAST  Result Date: 05/09/2022 CLINICAL DATA:  Acute generalized abdominal pain. EXAM: CT ABDOMEN AND PELVIS WITH CONTRAST  TECHNIQUE: Multidetector CT imaging of the abdomen and pelvis was performed using the standard protocol following bolus administration of intravenous contrast. RADIATION DOSE REDUCTION: This exam was performed according to the departmental dose-optimization program which includes automated exposure control, adjustment of the mA and/or kV according to patient size and/or use of iterative reconstruction technique. CONTRAST:  139m OMNIPAQUE IOHEXOL 300 MG/ML  SOLN COMPARISON:  August 08, 2020. FINDINGS: Lower chest: No acute abnormality. Hepatobiliary: No focal liver abnormality is seen. No gallstones, gallbladder wall thickening, or biliary dilatation. Pancreas: Large amount of inflammatory stranding is noted around the pancreas consistent with acute pancreatitis. No definite pseudocyst formation or ductal dilatation is noted. Spleen: Normal in size without focal abnormality. Adrenals/Urinary Tract: Adrenal glands are unremarkable. Kidneys are normal, without renal calculi, focal lesion, or hydronephrosis. Bladder is unremarkable. Stomach/Bowel: Stomach is within normal limits. Appendix appears normal. No evidence of bowel wall thickening, distention, or inflammatory changes. Vascular/Lymphatic: No significant vascular findings are present. No enlarged abdominal or pelvic lymph nodes. Reproductive: Uterus and bilateral adnexa are unremarkable. Other: No abdominal wall hernia or abnormality. No abdominopelvic ascites. Musculoskeletal: No acute or significant osseous findings. IMPRESSION: Findings consistent with acute pancreatitis. No definite pseudocyst formation is noted. Electronically Signed   By: JMarijo ConceptionM.D.   On: 05/09/2022 18:19   DG Chest 2 View  Result Date: 05/09/2022 CLINICAL DATA:  Chest pain EXAM: CHEST - 2 VIEW COMPARISON:  None Available. FINDINGS: The heart size and mediastinal contours are within normal limits. Streaky opacity within the lingula. Lungs are otherwise clear.  No pleural  effusion or pneumothorax. The visualized skeletal structures are unremarkable. IMPRESSION: Streaky opacity within the lingula, which may represent atelectasis versus developing infiltrate. Electronically Signed   By: Davina Poke D.O.   On: 05/09/2022 13:30    Assessment  36 y.o. female with a history of alcohol use, HTN, Liddle syndrome, B12/folate deficiency, and prior pancreatitis episodes including earlier this month who presented with recurrent pancreatitis.  GI consulted for further evaluation and management given imaging findings.  Pancreatitis: Suspected to be secondary to alcohol use.  Reports abstinence of alcohol since recent discharge last month.  CT A/P this admission with area of low-attenuation concerning for necrosis versus cystic lesion at the pancreatic tail.  Her course is also complicated by newly developed splenic vein thrombosis for which she is currently on anticoagulation for.  Diet was slowly advanced during hospital course to soft diet but was backed off to full liquids given increased pain and nausea.  Initially had been feeling well with full liquids but reported worsening 10/10 pain and nausea/vomiting overnight with request for additional pain medication. Had previously been nontoxic and afebrile.  Passing small amounts of gas, no bowel movement since Monday.  Mildly tender to lower abdomen on exam with increase in tenderness to her left upper quadrant.  She remains afebrile but has developed leukocytosis with WBC doubling today to 22 from 11 yesterday. We will obtain CT A/P with IV contrast and check CRP and procalcitonin given her increasing pain and leukocytosis.  She will be made n.p.o. and was started on IV fluids for hydration.  Given that she also has not had a bowel movement since Monday and her ongoing narcotic use, she may possibly have ileus contributing to symptoms.  Advised suppository today and start bowel regimen as tolerated.  Addendum: CRP elevated at 24.6,  procalcitonin 0.29 (wnl)  Splenic vein thrombosis: Likely secondary to pancreatitis. Initially on heparin drip.  Transition to Lovenox, started on Eliquis 10 mg twice daily on 06/06/2022.  Plan / Recommendations  NPO Restart IV fluids CRP and Procalcitonin CT A/P with IV contrast Suppository  Consideration for antibiotics pending labs and imaging findings    LOS: 7 days    06/08/2022, 10:56 AM   Venetia Night, MSN, FNP-BC, AGACNP-BC [facility]

## 2022-06-08 NOTE — Consult Note (Signed)
[facility] Consult  Reason for Consult: Splenic hematoma  Referring Physician:  Dr. Manuella Ghazi   Chief Complaint   Abdominal Pain     HPI: Jillian Shepherd is a 36 y.o. female with alcohol use, HTN, chronic pancreatitis with some acute pancreatitis and recent admission for acute alcoholic pancreatitis.  She came back to the hospital 9 days ago for acute pancreatitis and necrosis versus cyst forming on the tail.  She also was noted to have a splenic vein thrombosis and was started on eliquis.  She was then scanned again today for worsening leukocytosis . The patient has continued to have abdominal pain with some reports of it worsening.  She had a CT that demonstrates a splenic hematoma without extravasation. Her H&H has been stable. She has had hypertension and some tachycardia with her pain.  She was transferred to [facility] and typed and crossed for blood. She was reversed with [employer].   Past Medical History:  Diagnosis Date   Abdominal pain    Alcohol abuse    B12 deficiency    Cancer ([place])    squmaous cell skin cancer    Collagen vascular disease (HCC)    Hypokalemia    Liddle's syndrome    Obesity (BMI 35.0-39.9 without comorbidity)    Pancreatitis     Past Surgical History:  Procedure Laterality Date   ADENOIDECTOMY     COLONOSCOPY WITH PROPOFOL N/A 10/06/2020   Procedure: COLONOSCOPY WITH PROPOFOL;  Surgeon: Lin Landsman, MD;  Location: [facility] ENDOSCOPY;  Service: Gastroenterology;  Laterality: N/A;  COVID POSITIVE 08/23/2020   ESOPHAGOGASTRODUODENOSCOPY (EGD) WITH PROPOFOL N/A 10/06/2020   Procedure: ESOPHAGOGASTRODUODENOSCOPY (EGD) WITH PROPOFOL;  Surgeon: Lin Landsman, MD;  Location: [place];  Service: Gastroenterology;  Laterality: N/A;   TONSILLECTOMY      Family History  Problem Relation Age of Onset   Melanoma Paternal Grandmother        of skin   Hypertension Father    Anxiety disorder Father    Depression Father     Social  History   Tobacco Use   Smoking status: Every Day    Packs/day: 0.25    Years: 9.00    Total pack years: 2.25    Types: Cigarettes   Smokeless tobacco: Never  Vaping Use   Vaping Use: Never used  Substance Use Topics   Alcohol use: Yes    Alcohol/week: 2.0 standard drinks of alcohol    Types: 2 Cans of beer per week   Drug use: No    Medications: I have reviewed the patient's current medications. Prior to Admission:  Facility-Administered Medications Prior to Admission  Medication Dose Route Frequency Provider Last Rate Last Admin   meclizine (ANTIVERT) tablet 25 mg  25 mg Oral Q8H Mila Merry S, FNP       Medications Prior to Admission  Medication Sig Dispense Refill Last Dose   amLODipine (NORVASC) 10 MG tablet Take 1 tablet (10 mg total) by mouth daily. 90 tablet 3 05/30/2022   cyanocobalamin (VITAMIN B12) 1000 MCG/ML injection Inject 1 mL (1,000 mcg total) into the muscle every 30 (thirty) days. 1 mL 11 Past Month   folic acid (FOLVITE) 1 MG tablet Take 1 tablet (1 mg total) by mouth daily. 30 tablet 11 05/30/2022   metFORMIN (GLUCOPHAGE) 500 MG tablet Take 1 tablet (500 mg total) by mouth 2 (two) times daily with a meal. 180 tablet 3 05/30/2022   potassium chloride SA (KLOR-CON M20) 20 MEQ tablet Take 3  tablets (60 mEq total) by mouth daily. (Patient taking differently: Take 20 mEq by mouth daily.) 90 tablet 3 05/30/2022   spironolactone (ALDACTONE) 25 MG tablet Take 1 tablet (25 mg total) by mouth daily. (Patient taking differently: Take 25 mg by mouth 2 (two) times daily.) 90 tablet 3 05/30/2022   cefdinir (OMNICEF) 300 MG capsule Take 1 capsule (300 mg total) by mouth 2 (two) times daily. (Patient not taking: Reported on 05/31/2022) 8 capsule 0 Not Taking   doxycycline (VIBRA-TABS) 100 MG tablet Take 1 tablet (100 mg total) by mouth every 12 (twelve) hours. (Patient not taking: Reported on 05/31/2022) 8 tablet 0 Not Taking   oxycodone (OXY-IR) 5 MG capsule Take 1 capsule  (5 mg total) by mouth every 4 (four) hours as needed. (Patient not taking: Reported on 05/31/2022) 40 capsule 0 Not Taking   Scheduled:  sodium chloride   Intravenous Once   amLODipine  10 mg Oral Daily   Chlorhexidine Gluconate Cloth  6 each Topical Daily   folic acid  1 mg Oral Daily   HYDROmorphone   Intravenous Q4H   influenza vac split quadrivalent PF  0.5 mL Intramuscular Tomorrow-1000   potassium chloride SA  60 mEq Oral Daily   Continuous:  lactated ringers     TTS:[id number] **OR** acetaminophen, diphenhydrAMINE **OR** diphenhydrAMINE, meclizine, naloxone **AND** sodium chloride flush, ondansetron (ZOFRAN) IV, mouth rinse, prochlorperazine  No Known Allergies   ROS:  A comprehensive review of systems was negative except for: Gastrointestinal: positive for abdominal pain  Blood pressure (!) 163/113, pulse (!) 137, temperature 98.3 F (36.8 C), temperature source Oral, resp. rate (!) 22, height '5\' 3"'$  (1.6 m), weight 81.7 kg, last menstrual period 05/01/2022, SpO2 93 %. Physical Exam Vitals reviewed.  HENT:     Head: Normocephalic.  Cardiovascular:     Rate and Rhythm: Tachycardia present.  Pulmonary:     Effort: Pulmonary effort is normal.  Abdominal:     General: There is distension.     Palpations: Abdomen is soft.     Tenderness: There is abdominal tenderness in the epigastric area and left upper quadrant.  Musculoskeletal:     Comments: Moves all extremities   Skin:    General: Skin is warm.  Neurological:     General: No focal deficit present.     Mental Status: She is alert and oriented to person, place, and time.  Psychiatric:        Mood and Affect: Mood normal.     Results: Results for orders placed or performed during the hospital encounter of 05/31/22 (from the past 48 hour(s))  CBC     Status: Abnormal   Collection Time: 06/07/22  4:15 AM  Result Value Ref Range   WBC 11.5 (H) 4.0 - 10.5 K/uL   RBC 3.91 3.87 - 5.11 MIL/uL   Hemoglobin 14.0  12.0 - 15.0 g/dL   HCT 40.1 36.0 - 46.0 %   MCV 102.6 (H) 80.0 - 100.0 fL   MCH 35.8 (H) 26.0 - 34.0 pg   MCHC 34.9 30.0 - 36.0 g/dL   RDW 15.5 11.5 - 15.5 %   Platelets 394 150 - 400 K/uL   nRBC 0.0 0.0 - 0.2 %    Comment: Performed at [facility], [address] [place], [place] [address]  CBC     Status: Abnormal   Collection Time: 06/08/22  4:38 AM  Result Value Ref Range   WBC 22.6 (H) 4.0 - 10.5 K/uL  RBC 3.75 (L) 3.87 - 5.11 MIL/uL   Hemoglobin 13.3 12.0 - 15.0 g/dL   HCT 38.6 36.0 - 46.0 %   MCV 102.9 (H) 80.0 - 100.0 fL   MCH 35.5 (H) 26.0 - 34.0 pg   MCHC 34.5 30.0 - 36.0 g/dL   RDW 15.7 (H) 11.5 - 15.5 %   Platelets 503 (H) 150 - 400 K/uL   nRBC 0.0 0.0 - 0.2 %    Comment: Performed at [facility], [address] [place], [place] [address]  Basic metabolic panel     Status: Abnormal   Collection Time: 06/08/22  4:38 AM  Result Value Ref Range   Sodium 133 (L) 135 - 145 mmol/L   Potassium 4.1 3.5 - 5.1 mmol/L   Chloride 95 (L) 98 - 111 mmol/L   CO2 23 22 - 32 mmol/L   Glucose, Bld 196 (H) 70 - 99 mg/dL    Comment: Glucose reference range applies only to samples taken after fasting for at least 8 hours.   BUN 8 6 - 20 mg/dL   Creatinine, Ser 0.48 0.44 - 1.00 mg/dL   Calcium 8.9 8.9 - 10.3 mg/dL   GFR, Estimated >60 >60 mL/min    Comment: (NOTE) Calculated using the CKD-EPI Creatinine Equation (2021)    Anion gap 15 5 - 15    Comment: Performed at [facility], [address], [place], [place] [address]  Procalcitonin - Baseline     Status: None   Collection Time: 06/08/22  4:38 AM  Result Value Ref Range   Procalcitonin 0.29 ng/mL    Comment:        Interpretation: PCT (Procalcitonin) <= 0.5 ng/mL: Systemic infection (sepsis) is not likely. Local bacterial infection is possible. (NOTE)       Sepsis PCT Algorithm           Lower Respiratory Tract                                      Infection PCT Algorithm    ----------------------------      ----------------------------         PCT < 0.25 ng/mL                PCT < 0.10 ng/mL          Strongly encourage             Strongly discourage   discontinuation of antibiotics    initiation of antibiotics    ----------------------------     -----------------------------       PCT 0.25 - 0.50 ng/mL            PCT 0.10 - 0.25 ng/mL               OR       >80% decrease in PCT            Discourage initiation of                                            antibiotics      Encourage discontinuation           of antibiotics    ----------------------------     -----------------------------         PCT >= 0.50 ng/mL  PCT 0.26 - 0.50 ng/mL               AND        <80% decrease in PCT             Encourage initiation of                                             antibiotics       Encourage continuation           of antibiotics    ----------------------------     -----------------------------        PCT >= 0.50 ng/mL                  PCT > 0.50 ng/mL               AND         increase in PCT                  Strongly encourage                                      initiation of antibiotics    Strongly encourage escalation           of antibiotics                                     -----------------------------                                           PCT <= 0.25 ng/mL                                                 OR                                        > 80% decrease in PCT                                      Discontinue / Do not initiate                                             antibiotics  Performed at [facility], [address] [place], [place] [address]   C-reactive protein     Status: Abnormal   Collection Time: 06/08/22  9:48 AM  Result Value Ref Range   CRP 24.6 (H) <1.0 mg/dL    Comment: Performed at [place] [address], [place], [place] [address]  Hemoglobin and hematocrit, blood     Status: None   Collection Time: 06/08/22  1:54 PM   Result Value  Ref Range   Hemoglobin 12.8 12.0 - 15.0 g/dL   HCT 36.5 36.0 - 46.0 %    Comment: Performed at [facility], [address], [place], [place] [address]  ABO/Rh     Status: None   Collection Time: 06/08/22  1:54 PM  Result Value Ref Range   ABO/RH(D)      O POS Performed at [facility], [address] [place], [place] [address]   Type and screen [facility]     Status: None (Preliminary result)   Collection Time: 06/08/22  3:09 PM  Result Value Ref Range   ABO/RH(D) O POS    Antibody Screen NEG    Sample Expiration 06/11/2022,2359    Unit Number [id number]    Blood Component Type RED CELLS,LR    Unit division 00    Status of Unit ALLOCATED    Transfusion Status OK TO TRANSFUSE    Crossmatch Result Compatible    Unit Number [id number]    Blood Component Type RED CELLS,LR    Unit division 00    Status of Unit ALLOCATED    Transfusion Status OK TO TRANSFUSE    Crossmatch Result      Compatible Performed at [facility], [address], [place], [place] [address]   Prepare RBC (crossmatch)     Status: None   Collection Time: 06/08/22  3:16 PM  Result Value Ref Range   Order Confirmation      ORDER PROCESSED BY BLOOD BANK Performed at [facility], [address], [place], [place] [address]    Personally reviewed- splenic vein thrombosis, splenic hematoma, pseudocyst of pancreatitis  CT ABDOMEN PELVIS W CONTRAST  Result Date: 06/08/2022 CLINICAL DATA:  Nausea, vomiting, epigastric pain, splenic vein thrombosis, pancreatic necrosis, leukocytosis EXAM: CT ABDOMEN AND PELVIS WITH CONTRAST TECHNIQUE: Multidetector CT imaging of the abdomen and pelvis was performed using the standard protocol following bolus administration of intravenous contrast. RADIATION DOSE REDUCTION: This exam was performed according to the departmental dose-optimization program which includes automated exposure control, adjustment of the mA and/or kV according to patient size and/or  use of iterative reconstruction technique. CONTRAST:  161m OMNIPAQUE IOHEXOL 300 MG/ML  SOLN COMPARISON:  05/31/2022 FINDINGS: Lower chest: New moderate left pleural effusion and consolidation/atelectasis in the visualized dependent aspect of the left lung. Trace right pleural fluid. No pericardial effusion. Hepatobiliary: Gallbladder is physiologically distended without calcified stones. No focal liver lesion or biliary ductal dilatation. Pancreas: Perhaps marginal improvement in the peripancreatic inflammatory change. Peripancreatic pseudocysts with dominant cluster near the body/tail junction are slightly better marginated, similar in size. There is a new well-marginated pseudocyst abutting the lateral wall of gastric body, 4.4 x 2.7 cm. Pancreatic parenchymal enhancement stable. Spleen: Interval development of large mixed-attenuation perisplenic or subcapsular hematoma , margins difficult to differentiate from the spleen itself, the overall process measuring 14 x 10.2 x 9.9 cm (740cc) , compared to original spleen dimensions 11.8 x 4.1 x7.4 cm (187cc) on prior study. No convincing active extravasation on the single phase of the scan. Adrenals/Urinary Tract: No adrenal mass. Symmetric renal enhancement without focal lesion or hydronephrosis. Urinary bladder physiologically distended. Stomach/Bowel: Stomach is incompletely distended, with mucosal enhancement. The small bowel is decompressed. Normal appendix. Colon is incompletely distended, unremarkable. Vascular/Lymphatic: No significant arterial pathology evident. Portal vein patent. Incomplete opacification of the splenic vein consistent with previously described splenic vein thrombosis. SMV is patent. Bilateral renal veins patent. No abdominal or pelvic adenopathy localized. Reproductive: Uterus and bilateral adnexa  are unremarkable. Other: Small volume pelvic ascites. Infiltrative changes in the right upper quadrant and perisplenic mesenteric and  retroperitoneal fat may be related to pancreatitis or the splenic hemorrhage. Musculoskeletal: No acute or significant osseous findings. IMPRESSION: 1. Interval development of 500+mL mixed-attenuation perisplenic/subcapsular splenic hematoma, without evident active extravasation. 2. New 4.4 cm pseudocyst abutting the lateral wall of the gastric body. 3. New moderate left pleural effusion and consolidation/atelectasis in the visualized dependent aspect of the left lung. 4. Some improvement in peripancreatic inflammatory change with stable peripancreatic pseudocysts. 5. Small volume pelvic ascites. 6. Incomplete opacification of the splenic vein consistent with previously described splenic vein thrombosis. Electronically Signed   By: Lucrezia Europe M.D.   On: 06/08/2022 13:32     Assessment & Plan:  Jillian Shepherd is a 36 y.o. female with a splenic hematoma that is related to her pancreatitis. This is a very rare complication of pancreatitis, sources siting 0.4% of the time.  Based on the quick literature search I completed this can be managed nonoperatively if she is stable but can require embolization from IR versus splenectomy if things progress or rupture.    [facility] has limited resources, limited blood available and limited staffing on the floor and in the OR especially at night and on weekends.  I think the patient should be transferred for safety to ensure she will be able to be one of the people that can be managed non-operatively.   She has blood available here if things change.  She has been reversed.   I called [facility] and notified them of the pending transfer. Dr. Manuella Ghazi had already called Dr. Grandville Silos.    GI and Hospitalist also following.   All questions were answered to the satisfaction of the patient.   Virl Cagey 06/08/2022, 6:34 PM

## 2022-06-08 NOTE — Progress Notes (Signed)
Report called and given to RN on [facility]. Night shift RN called [facility] for transport.

## 2022-06-09 ENCOUNTER — Inpatient Hospital Stay (HOSPITAL_COMMUNITY): Payer: 59

## 2022-06-09 ENCOUNTER — Encounter (HOSPITAL_COMMUNITY): Payer: Self-pay | Admitting: Internal Medicine

## 2022-06-09 DIAGNOSIS — K859 Acute pancreatitis without necrosis or infection, unspecified: Secondary | ICD-10-CM | POA: Diagnosis not present

## 2022-06-09 DIAGNOSIS — S36029A Unspecified contusion of spleen, initial encounter: Secondary | ICD-10-CM

## 2022-06-09 DIAGNOSIS — R197 Diarrhea, unspecified: Secondary | ICD-10-CM | POA: Diagnosis not present

## 2022-06-09 DIAGNOSIS — I151 Hypertension secondary to other renal disorders: Secondary | ICD-10-CM | POA: Diagnosis not present

## 2022-06-09 DIAGNOSIS — K863 Pseudocyst of pancreas: Secondary | ICD-10-CM

## 2022-06-09 DIAGNOSIS — K852 Alcohol induced acute pancreatitis without necrosis or infection: Secondary | ICD-10-CM

## 2022-06-09 DIAGNOSIS — R42 Dizziness and giddiness: Secondary | ICD-10-CM

## 2022-06-09 DIAGNOSIS — I10 Essential (primary) hypertension: Secondary | ICD-10-CM | POA: Diagnosis not present

## 2022-06-09 DIAGNOSIS — S36029D Unspecified contusion of spleen, subsequent encounter: Secondary | ICD-10-CM

## 2022-06-09 LAB — CBC
HCT: 31.4 % — ABNORMAL LOW (ref 36.0–46.0)
Hemoglobin: 11.2 g/dL — ABNORMAL LOW (ref 12.0–15.0)
MCH: 35.6 pg — ABNORMAL HIGH (ref 26.0–34.0)
MCHC: 35.7 g/dL (ref 30.0–36.0)
MCV: 99.7 fL (ref 80.0–100.0)
Platelets: 477 10*3/uL — ABNORMAL HIGH (ref 150–400)
RBC: 3.15 MIL/uL — ABNORMAL LOW (ref 3.87–5.11)
RDW: 16 % — ABNORMAL HIGH (ref 11.5–15.5)
WBC: 25.7 10*3/uL — ABNORMAL HIGH (ref 4.0–10.5)
nRBC: 0 % (ref 0.0–0.2)

## 2022-06-09 LAB — LACTATE DEHYDROGENASE, PLEURAL OR PERITONEAL FLUID: LD, Fluid: 282 U/L — ABNORMAL HIGH (ref 3–23)

## 2022-06-09 LAB — HEMOGLOBIN AND HEMATOCRIT, BLOOD
HCT: 31.6 % — ABNORMAL LOW (ref 36.0–46.0)
HCT: 29.2 % — ABNORMAL LOW (ref 36.0–46.0)
HCT: 29.2 % — ABNORMAL LOW (ref 36.0–46.0)
HCT: 28.4 % — ABNORMAL LOW (ref 36.0–46.0)
Hemoglobin: 10.9 g/dL — ABNORMAL LOW (ref 12.0–15.0)
Hemoglobin: 10.4 g/dL — ABNORMAL LOW (ref 12.0–15.0)
Hemoglobin: 10.4 g/dL — ABNORMAL LOW (ref 12.0–15.0)
Hemoglobin: 10.1 g/dL — ABNORMAL LOW (ref 12.0–15.0)

## 2022-06-09 LAB — COMPREHENSIVE METABOLIC PANEL
ALT: 8 U/L (ref 0–44)
ALT: 8 U/L (ref 0–44)
AST: 13 U/L — ABNORMAL LOW (ref 15–41)
AST: 14 U/L — ABNORMAL LOW (ref 15–41)
Albumin: 2.1 g/dL — ABNORMAL LOW (ref 3.5–5.0)
Albumin: 2 g/dL — ABNORMAL LOW (ref 3.5–5.0)
Alkaline Phosphatase: 112 U/L (ref 38–126)
Alkaline Phosphatase: 115 U/L (ref 38–126)
Anion gap: 15 (ref 5–15)
Anion gap: 10 (ref 5–15)
BUN: 8 mg/dL (ref 6–20)
BUN: 10 mg/dL (ref 6–20)
CO2: 27 mmol/L (ref 22–32)
CO2: 27 mmol/L (ref 22–32)
Calcium: 9.1 mg/dL (ref 8.9–10.3)
Calcium: 8.4 mg/dL — ABNORMAL LOW (ref 8.9–10.3)
Chloride: 96 mmol/L — ABNORMAL LOW (ref 98–111)
Chloride: 97 mmol/L — ABNORMAL LOW (ref 98–111)
Creatinine, Ser: 0.51 mg/dL (ref 0.44–1.00)
Creatinine, Ser: 0.47 mg/dL (ref 0.44–1.00)
GFR, Estimated: >60 mL/min (ref >60)
GFR, Estimated: >60 mL/min (ref >60)
Glucose, Bld: 144 mg/dL — ABNORMAL HIGH (ref 70–99)
Glucose, Bld: 133 mg/dL — ABNORMAL HIGH (ref 70–99)
Potassium: 4.4 mmol/L (ref 3.5–5.1)
Potassium: 4.4 mmol/L (ref 3.5–5.1)
Sodium: 138 mmol/L (ref 135–145)
Sodium: 134 mmol/L — ABNORMAL LOW (ref 135–145)
Total Bilirubin: 0.8 mg/dL (ref 0.3–1.2)
Total Bilirubin: 0.7 mg/dL (ref 0.3–1.2)
Total Protein: 6.4 g/dL — ABNORMAL LOW (ref 6.5–8.1)
Total Protein: 6.3 g/dL — ABNORMAL LOW (ref 6.5–8.1)

## 2022-06-09 LAB — GRAM STAIN

## 2022-06-09 LAB — BODY FLUID CELL COUNT WITH DIFFERENTIAL
Eos, Fluid: 0 %
Lymphs, Fluid: 26 %
Monocyte-Macrophage-Serous Fluid: 12 % — ABNORMAL LOW (ref 50–90)
Neutrophil Count, Fluid: 62 % — ABNORMAL HIGH (ref 0–25)
Total Nucleated Cell Count, Fluid: 3053 cu mm — ABNORMAL HIGH (ref 0–1000)

## 2022-06-09 LAB — MAGNESIUM
Magnesium: 1.4 mg/dL — ABNORMAL LOW (ref 1.7–2.4)
Magnesium: 1.4 mg/dL — ABNORMAL LOW (ref 1.7–2.4)

## 2022-06-09 LAB — LACTIC ACID, PLASMA
Lactic Acid, Venous: 1 mmol/L (ref 0.5–1.9)
Lactic Acid, Venous: 0.8 mmol/L (ref 0.5–1.9)

## 2022-06-09 LAB — GLUCOSE, PLEURAL OR PERITONEAL FLUID: Glucose, Fluid: 131 mg/dL

## 2022-06-09 LAB — TYPE AND SCREEN
ABO/RH(D): O POS
Antibody Screen: NEGATIVE

## 2022-06-09 LAB — ALBUMIN, PLEURAL OR PERITONEAL FLUID: Albumin, Fluid: 1.8 g/dL

## 2022-06-09 LAB — LIPASE, BLOOD: Lipase: 30 U/L (ref 11–51)

## 2022-06-09 LAB — PROCALCITONIN: Procalcitonin: 0.2 ng/mL

## 2022-06-09 LAB — PHOSPHORUS: Phosphorus: 4.3 mg/dL (ref 2.5–4.6)

## 2022-06-09 MED ORDER — LIDOCAINE HCL (PF) 1 % IJ SOLN
INTRAMUSCULAR | Status: AC
  Administered 2022-06-09: 6 mL via INTRADERMAL
  Filled 2022-06-09: qty 30

## 2022-06-09 MED ORDER — MAGNESIUM SULFATE 2 GM/50ML IV SOLN
2.0000 g | Freq: Once | INTRAVENOUS | Status: AC
  Administered 2022-06-09: 2 g via INTRAVENOUS
  Filled 2022-06-09: qty 50

## 2022-06-09 MED ORDER — LIDOCAINE HCL (PF) 1 % IJ SOLN: 6.0000 mL | Freq: Once | INTRAMUSCULAR | Status: DC

## 2022-06-09 MED ORDER — METOPROLOL TARTRATE 5 MG/5ML IV SOLN
5.0000 mg | INTRAVENOUS | Status: AC
  Administered 2022-06-09: 5 mg via INTRAVENOUS
  Filled 2022-06-09: qty 5

## 2022-06-09 NOTE — Consult Note (Addendum)
Consultation Note   Referring Provider: [facility] PCP: [name], MD Primary Gastroenterologist: Dr. Marius Ditch Reason for consultation: pancreatitis  Hospital Day: 10    Attending physician's note  I have taken a history, reviewed the chart and examined the patient. I performed a substantive portion of this encounter, including complete performance of at least one of the key components, in conjunction with the APP. I agree with the APP's note, impression and recommendations.    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 The patient agreed with the plan and demonstrated an understanding of the instructions.  Damaris Hippo , MD [phone]    Assessment     # 36 yo female with recurrent, acute pancreatitis complicated by possible developing pseudocyst (vrs area of necrosis) and also  acute SV thrombosis. Etiology of pancreatitis likely Etoh related,  no cholelithiasis or biliary duct dilation of recent US. No culprit medications.  No pancreatic calcifications or PD dilation on imaging to suggest chronic pancreatitis.  Continue maintenance fluids, supportive care.  Will need follow up imaging of pancreas in a few weeks.   # Acute SV thrombosis c/b development of perisplenic / subcapsular hematoma on anticoagulation on 11/3. This was found on CT scan done for acute worsening of abdominal pain and rise in WBC. Transferred to [facility]  for possible IR intervention.  Anticoagufrom [employer] on hold, received [employer] . Hgb stable yesterday at 12.4 ( no labs done today)  WBC remains elevated at 22K General Surgery is following.   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 No evidence for microscopic colitis or celiac disease on workup in 2022. Possibly EPI?  fecal elastase can be obtained by her outpatient GI. Last fecal elastase in 2021 was normal.   # See PMH for additional medical problems  HPI   Jillian Shepherd is a 36 y.o. female with a past medical history significant for recurrent acute pancreatitis, obesity, chronic diarrhea, colon polyps, HTN, EToh abuse, Liddle syndrome, B12 / folate deficiency.  See PMH for any additional medical problems.  Patient hospitalized early October with acute Etoh pancreatitis. Discharged on 10/8. Readmitted 10/26 with abdominal pain. CT scan showed persistent acute pancreatitis, new SV thrombosis and possible developing pancreatic cyst vrs area of necrosis. Supportive care provided. She was started on Lovenox and then transitioned to Eliquis. Within a couple of days she developed worsening abdomina pain and rise in WBC. She didn't sustain any trauma but just felt " a pop" in left abdomen after eating and then pain escalated. Repeat CT scan showed a new perisplenic hematoma. GI discussed with General Surgery, felt it best to transfer patient to [facility] in case embolization of hematoma was needed. During  her hospitalization she has been hypoxic. CT scan yesterday showed a moderate left pleural effusion  General Surgery saw patient this am. Monitoring hgb, no plans for surgery at this point. She had a thoracentesis today with removal of 500 cc fluid. She has been having stabbing pain in left back which hasn't improved since  thoracentesis. However she feels much less SHOB since fluid removed.  .   Previous GI Evaluation    March 2022 colonoscopy for chronic diarrhea - [facility] - Preparation of the colon was inadequate. - The examined portion of the ileum was normal. - One 6 mm polyp in the transverse colon, removed with a cold snare. Resected and retrieved. - Erythematous mucosa in the sigmoid colon. Biopsied. - The distal rectum and anal verge are normal on retroflexion view. -6 month repeat colonoscopy was recommended.   C. COLON POLYP, TRANSVERSE; COLD SNARE: - MULTIPLE FRAGMENTS OF TUBULAR ADENOMA. - NEGATIVE FOR HIGH-GRADE DYSPLASIA AND MALIGNANCY.  D. COLON, SIGMOID; COLD BIOPSY: - BENIGN COLONIC MUCOSA WITH SUPERFICIAL REACTIVE CHANGES, AND CHANGES SUGGESTIVE OF HEALING MUCOSAL INJURY. - NEGATIVE FOR ACTIVE MUCOSAL COLITIS AND FEATURES OF MICROSCOPIC COLITIS. - NEGATIVE FOR DYSPLASIA AND MALIGNANCY.   EGD- chronic diarrhea. [facility] Dr. Reece Levy --Normal ampulla, duodenal bulb and second portion of the duodenum. Biopsied. - Erythematous mucosa in the antrum. Biopsied. - Normal gastric body and incisura. Biopsied. - Small hiatal hernia. - Esophagogastric landmarks identified. - Normal gastroesophageal junction and esophagus  DIAGNOSIS:  A. DUODENUM; COLD BIOPSY:  - ENTERIC MUCOSA WITH PRESERVED VILLOUS ARCHITECTURE AND NO SIGNIFICANT  HISTOPATHOLOGIC CHANGE.  - NEGATIVE FOR FEATURES OF CELIAC, DYSPLASIA, AND MALIGNANCY.   B. STOMACH; COLD BIOPSY:  - GASTRIC ANTRAL AND OXYNTIC MUCOSA WITH FEATURES OF MILD REACTIVE  GASTROPATHY.  - NEGATIVE FOR H. PYLORI, DYSPLASIA, AND MALIGNANCY.    Recent Labs and Imaging CT ABDOMEN PELVIS W CONTRAST  Result Date: 06/08/2022 CLINICAL DATA:  Nausea, vomiting, epigastric pain, splenic vein thrombosis, pancreatic necrosis, leukocytosis EXAM: CT ABDOMEN AND PELVIS WITH CONTRAST TECHNIQUE: Multidetector CT imaging of the abdomen and pelvis was performed using the  standard protocol following bolus administration of intravenous contrast. RADIATION DOSE REDUCTION: This exam was performed according to the departmental dose-optimization program which includes automated exposure control, adjustment of the mA and/or kV according to patient size and/or use of iterative reconstruction technique. CONTRAST:  174m OMNIPAQUE IOHEXOL 300 MG/ML  SOLN COMPARISON:  05/31/2022 FINDINGS: Lower chest: New moderate left pleural effusion and consolidation/atelectasis in the visualized dependent aspect of the left lung. Trace right pleural fluid. No pericardial effusion. Hepatobiliary: Gallbladder is physiologically distended without calcified stones. No focal liver lesion or biliary ductal dilatation. Pancreas: Perhaps marginal improvement in the peripancreatic inflammatory change. Peripancreatic pseudocysts with dominant cluster near the body/tail junction are slightly better marginated, similar in size. There is a new well-marginated pseudocyst abutting the lateral wall of gastric body, 4.4 x 2.7 cm. Pancreatic parenchymal enhancement stable. Spleen: Interval development of large mixed-attenuation perisplenic or subcapsular hematoma , margins difficult to differentiate from the spleen itself, the overall process measuring 14 x 10.2 x 9.9 cm (740cc) , compared to original spleen dimensions 11.8 x 4.1 x7.4 cm (187cc) on prior study. No convincing active extravasation on the single phase of the scan. Adrenals/Urinary Tract: No adrenal mass. Symmetric renal enhancement without focal lesion or hydronephrosis. Urinary bladder physiologically distended. Stomach/Bowel: Stomach is incompletely distended, with mucosal enhancement. The small bowel is decompressed. Normal appendix. Colon is incompletely distended, unremarkable. Vascular/Lymphatic: No significant arterial pathology evident.  Portal vein patent. Incomplete opacification of the splenic vein consistent with previously described splenic vein  thrombosis. SMV is patent. Bilateral renal veins patent. No abdominal or pelvic adenopathy localized. Reproductive: Uterus and bilateral adnexa are unremarkable. Other: Small volume pelvic ascites. Infiltrative changes in the right upper quadrant and perisplenic mesenteric and retroperitoneal fat may be related to pancreatitis or the splenic hemorrhage. Musculoskeletal: No acute or significant osseous findings. IMPRESSION: 1. Interval development of 500+mL mixed-attenuation perisplenic/subcapsular splenic hematoma, without evident active extravasation. 2. New 4.4 cm pseudocyst abutting the lateral wall of the gastric body. 3. New moderate left pleural effusion and consolidation/atelectasis in the visualized dependent aspect of the left lung. 4. Some improvement in peripancreatic inflammatory change with stable peripancreatic pseudocysts. 5. Small volume pelvic ascites. 6. Incomplete opacification of the splenic vein consistent with previously described splenic vein thrombosis. Electronically Signed   By: Lucrezia Europe M.D.   On: 06/08/2022 13:32   DG CHEST PORT 1 VIEW  Result Date: 06/01/2022 CLINICAL DATA:  Chest pain EXAM: PORTABLE CHEST 1 VIEW COMPARISON:  Chest radiograph 05/09/2022, CTA chest 05/11/2022 FINDINGS: The cardiomediastinal silhouette is stable. Lung volumes are low. There is small left pleural effusion with adjacent retrocardiac opacity which may reflect atelectasis or pneumonia, likely similar to the CTA from 05/11/2022 but new since the radiograph from 05/09/2022. The left upper lobe is well-aerated. The right lung is clear. There is no right pleural effusion. There is no acute osseous abnormality. IMPRESSION: Small left pleural effusion and adjacent retrocardiac opacity which may reflect atelectasis or pneumonia, likely similar to the CTA chest from 05/11/2022 but new since 05/09/2022. Electronically Signed   By: Valetta Mole M.D.   On: 06/01/2022 12:56   CT ABDOMEN PELVIS W  CONTRAST  Result Date: 05/31/2022 CLINICAL DATA:  LEFT side abdominal pain since 0300 hours, acid reflux symptoms yesterday, progressively worsening pain moving to LEFT side of abdomen and radiating to chest and shoulder, associated nausea and diarrhea. History collagen vascular disease, hypertension, pancreatitis, smoking EXAM: CT ABDOMEN AND PELVIS WITH CONTRAST TECHNIQUE: Multidetector CT imaging of the abdomen and pelvis was performed using the standard protocol following bolus administration of intravenous contrast. RADIATION DOSE REDUCTION: This exam was performed according to the departmental dose-optimization program which includes automated exposure control, adjustment of the mA and/or kV according to patient size and/or use of iterative reconstruction technique. CONTRAST:  161m OMNIPAQUE IOHEXOL 300 MG/ML SOLN IV. No oral contrast. COMPARISON:  05/09/2022 FINDINGS: Lower chest: Lung bases clear Hepatobiliary: Gallbladder and liver normal appearance Pancreas: Enlarged edematous pancreas with diffuse infiltration of peripancreatic tissue planes consistent with acute pancreatitis, also seen on prior exam. New area of low attenuation within pancreatic tail however, approximately 3.0 x 2.6 cm, either pancreatic necrosis versus developing cystic lesion. Increased edema adjacent to stomach and spleen. Small amount of fluid in lesser sac. No pancreatic calcifications or ductal dilatation. Spleen: Normal appearance Adrenals/Urinary Tract: Adrenal glands, kidneys, ureters, and bladder normal appearance Stomach/Bowel: Normal appendix. Stomach and bowel loops otherwise unremarkable. Vascular/Lymphatic: Interval splenic vein thrombosis. Remaining vascular structures patent. Aorta normal caliber. No adenopathy. Reproductive: Unremarkable uterus and ovaries Other: Small LEFT inguinal hernia containing fat. Small amount of free fluid in pelvis. No free air. Musculoskeletal: Unremarkable IMPRESSION: Acute pancreatitis  with increased surrounding peripancreatic and LEFT upper quadrant edema New area of low attenuation within pancreatic tail approximately 3.0 x 2.6 cm, either representing pancreatic necrosis or developing cystic lesion. Interval splenic vein thrombosis. Small LEFT inguinal hernia containing fat. Electronically Signed  By: Lavonia Dana M.D.   On: 05/31/2022 10:16   CT Angio Chest Pulmonary Embolism (PE) W or WO Contrast  Result Date: 05/11/2022 CLINICAL DATA:  Positive D-dimer, hypoxia.  Shortness of breath. EXAM: CT ANGIOGRAPHY CHEST WITH CONTRAST TECHNIQUE: Multidetector CT imaging of the chest was performed using the standard protocol during bolus administration of intravenous contrast. Multiplanar CT image reconstructions and MIPs were obtained to evaluate the vascular anatomy. RADIATION DOSE REDUCTION: This exam was performed according to the departmental dose-optimization program which includes automated exposure control, adjustment of the mA and/or kV according to patient size and/or use of iterative reconstruction technique. CONTRAST:  12m OMNIPAQUE IOHEXOL 350 MG/ML SOLN COMPARISON:  None Available. FINDINGS: Cardiovascular: Satisfactory opacification of the pulmonary arteries to the segmental level. No evidence of pulmonary embolism. Normal heart size. No pericardial effusion. Mediastinum/Nodes: No enlarged mediastinal, hilar, or axillary lymph nodes. Thyroid gland, trachea, and esophagus demonstrate no significant findings. Lungs/Pleura: Moderate left and small right pleural effusions are present. There are patchy multifocal ground-glass opacities in the upper lobes, right greater than left. There is compressive atelectasis in the lingula and left lower lobe. No pneumothorax. Trachea and central airways are patent. Upper Abdomen: There is trace free fluid in the right upper quadrant. Musculoskeletal: No chest wall abnormality. No acute or significant osseous findings. Review of the MIP images confirms  the above findings. IMPRESSION: 1. No evidence for pulmonary embolism. 2. Moderate left and small right pleural effusions. 3. Patchy multifocal ground-glass opacities in the upper lobes, right greater than left. Findings may be infectious/inflammatory or related to pulmonary edema. 4. Trace free fluid in the right upper quadrant of the abdomen. Electronically Signed   By: ARonney AstersM.D.   On: 05/11/2022 21:12   ECHOCARDIOGRAM COMPLETE  Result Date: 05/10/2022    ECHOCARDIOGRAM REPORT   Patient Name:   NMEILY GLOWACKIDate of Exam: 05/10/2022 Medical Rec #:  [id number]      Height:       63.0 in Accession #:    [id number]     Weight:       190.3 lb Date of Birth:  2August 10, 1987      BSA:          1.893 m Patient Age:    89years        BP:           166/104 mmHg Patient Gender: F               HR:           117 bpm. [facility]  [facility] Echo, Cardiac Doppler and Color Doppler Indications:    Syncope  History:        Patient has no prior history of Echocardiogram examinations.                 Signs/Symptoms:Syncope; Risk Factors:Hypertension. ETOH abuse.  Sonographer:    DWenda LowReferring Phys: [id number] ADEFESO IMPRESSIONS  1. Difficult acoustic windows Endocardium is difficult to see in apical views. Possible basal inferior hypokinesis. Would recommend limited echo with Definity to confirm wall motion. Left ventricular ejection fraction, by estimation, is 60 to 65%. The left ventricle has normal function. There is mild left ventricular hypertrophy. Indeterminate diastolic filling due to E-A fusion.  2. Right ventricular systolic function is normal. The right ventricular size is normal.  3. The mitral valve is normal in structure. Trivial mitral valve regurgitation.  4. The  aortic valve is normal in structure. Aortic valve regurgitation is not visualized.  5. The inferior vena cava is normal in size with greater than 50% respiratory variability, suggesting right atrial  pressure of 3 mmHg. FINDINGS  Left Ventricle: Difficult acoustic windows Endocardium is difficult to see in apical views. Possible basal inferior hypokinesis. Would recommend limited echo with Definity to confirm wall motion. Left ventricular ejection fraction, by estimation, is 60 to 65%. The left ventricle has normal function. The left ventricular internal cavity size was normal in size. There is mild left ventricular hypertrophy. Indeterminate diastolic filling due to E-A fusion. Right Ventricle: The right ventricular size is normal. Right vetricular wall thickness was not assessed. Right ventricular systolic function is normal. Left Atrium: Left atrial size was normal in size. Right Atrium: Right atrial size was normal in size. Pericardium: There is no evidence of pericardial effusion. Mitral Valve: The mitral valve is normal in structure. Trivial mitral valve regurgitation. MV peak gradient, 5.9 mmHg. The mean mitral valve gradient is 2.0 mmHg. Tricuspid Valve: The tricuspid valve is normal in structure. Tricuspid valve regurgitation is trivial. Aortic Valve: The aortic valve is normal in structure. Aortic valve regurgitation is not visualized. Aortic valve mean gradient measures 4.0 mmHg. Aortic valve peak gradient measures 7.7 mmHg. Aortic valve area, by VTI measures 2.85 cm. Pulmonic Valve: The pulmonic valve was normal in structure. Pulmonic valve regurgitation is trivial. Aorta: The aortic root is normal in size and structure. Venous: The inferior vena cava is normal in size with greater than 50% respiratory variability, suggesting right atrial pressure of 3 mmHg. IAS/Shunts: No atrial level shunt detected by color flow Doppler.  LEFT VENTRICLE PLAX 2D LVIDd:         4.80 cm   Diastology LVIDs:         2.90 cm   LV e' medial:    7.40 cm/s LV PW:         1.10 cm   LV E/e' medial:  7.9 LV IVS:        1.20 cm   LV e' lateral:   8.59 cm/s LVOT diam:     2.00 cm   LV E/e' lateral: 6.8 LV SV:         66 LV SV  Index:   35 LVOT Area:     3.14 cm  RIGHT VENTRICLE RV Basal diam:  2.80 cm RV Mid diam:    2.50 cm RV S prime:     20.50 cm/s TAPSE (M-mode): 2.7 cm LEFT ATRIUM             Index        RIGHT ATRIUM           Index LA diam:        3.50 cm 1.85 cm/m   RA Area:     13.40 cm LA Vol (A2C):   26.1 ml 13.79 ml/m  RA Volume:   31.40 ml  16.59 ml/m LA Vol (A4C):   30.4 ml 16.06 ml/m LA Biplane Vol: 29.2 ml 15.42 ml/m  AORTIC VALVE                    PULMONIC VALVE AV Area (Vmax):    2.49 cm     PV Vmax:       1.10 m/s AV Area (Vmean):   2.34 cm     PV Peak grad:  4.8 mmHg AV Area (VTI):     2.85 cm AV Vmax:  139.00 cm/s AV Vmean:          95.100 cm/s AV VTI:            0.230 m AV Peak Grad:      7.7 mmHg AV Mean Grad:      4.0 mmHg LVOT Vmax:         110.00 cm/s LVOT Vmean:        70.700 cm/s LVOT VTI:          0.209 m LVOT/AV VTI ratio: 0.91  AORTA Ao Root diam: 3.20 cm MITRAL VALVE MV Area (PHT): 7.82 cm     SHUNTS MV Area VTI:   3.95 cm     Systemic VTI:  0.21 m MV Peak grad:  5.9 mmHg     Systemic Diam: 2.00 cm MV Mean grad:  2.0 mmHg MV Vmax:       1.21 m/s MV Vmean:      66.3 cm/s MV Decel Time: 97 msec MV E velocity: 58.70 cm/s MV A velocity: 102.00 cm/s MV E/A ratio:  0.58 Dorris Carnes MD Electronically signed by Dorris Carnes MD Signature Date/Time: 05/10/2022/5:02:45 PM    Final     Labs:  Recent Labs    06/07/22 0415 06/08/22 0438 06/08/22 1354 06/08/22 2006  WBC 11.5* 22.6*  --   --   HGB 14.0 13.3 12.8 12.6  HCT 40.1 38.6 36.5 35.8*  PLT 394 503*  --   --    Recent Labs    06/08/22 0438  NA 133*  K 4.1  CL 95*  CO2 23  GLUCOSE 196*  BUN 8  CREATININE 0.48  CALCIUM 8.9   No results for input(s): "PROT", "ALBUMIN", "AST", "ALT", "ALKPHOS", "BILITOT", "BILIDIR", "IBILI" in the last 72 hours. No results for input(s): "HEPBSAG", "HCVAB", "HEPAIGM", "HEPBIGM" in the last 72 hours. No results for input(s): "LABPROT", "INR" in the last 72 hours.  Past Medical History:   Diagnosis Date   Abdominal pain    Alcohol abuse    B12 deficiency    Cancer ([place])    squmaous cell skin cancer    Collagen vascular disease ([facility])    Hypokalemia    Liddle's syndrome    Obesity (BMI 35.0-39.9 without comorbidity)    Pancreatitis     Past Surgical History:  Procedure Laterality Date   ADENOIDECTOMY     COLONOSCOPY WITH PROPOFOL N/A 10/06/2020   Procedure: COLONOSCOPY WITH PROPOFOL;  Surgeon: Lin Landsman, MD;  Location: [facility] ENDOSCOPY;  Service: Gastroenterology;  Laterality: N/A;  COVID POSITIVE 08/23/2020   ESOPHAGOGASTRODUODENOSCOPY (EGD) WITH PROPOFOL N/A 10/06/2020   Procedure: ESOPHAGOGASTRODUODENOSCOPY (EGD) WITH PROPOFOL;  Surgeon: Lin Landsman, MD;  Location: [place];  Service: Gastroenterology;  Laterality: N/A;   TONSILLECTOMY      Family History  Problem Relation Age of Onset   Melanoma Paternal Grandmother        of skin   Hypertension Father    Anxiety disorder Father    Depression Father     Prior to Admission medications   Medication Sig Start Date End Date Taking? Authorizing Provider  amLODipine (NORVASC) 10 MG tablet Take 1 tablet (10 mg total) by mouth daily. 04/10/22  Yes Howard, Amber S, FNP  cyanocobalamin (VITAMIN B12) 1000 MCG/ML injection Inject 1 mL (1,000 mcg total) into the muscle every 30 (thirty) days. 05/17/22  Yes [name], MD  folic acid (FOLVITE) 1 MG tablet Take 1 tablet (1 mg total) by mouth daily. 05/17/22  05/12/23 Yes [name], MD  metFORMIN (GLUCOPHAGE) 500 MG tablet Take 1 tablet (500 mg total) by mouth 2 (two) times daily with a meal. 05/18/22  Yes Pickard, Cammie Mcgee, MD  potassium chloride SA (KLOR-CON M20) 20 MEQ tablet Take 3 tablets (60 mEq total) by mouth daily. Patient taking differently: Take 20 mEq by mouth daily. 05/21/22  Yes Mila Merry S, FNP  spironolactone (ALDACTONE) 25 MG tablet Take 1 tablet (25 mg total) by mouth daily. Patient taking differently: Take 25 mg by mouth  2 (two) times daily. 05/13/22  Yes Tat, Shanon Brow, MD  cefdinir (OMNICEF) 300 MG capsule Take 1 capsule (300 mg total) by mouth 2 (two) times daily. Patient not taking: Reported on 05/31/2022 05/13/22   Orson Eva, MD  doxycycline (VIBRA-TABS) 100 MG tablet Take 1 tablet (100 mg total) by mouth every 12 (twelve) hours. Patient not taking: Reported on 05/31/2022 05/13/22   Orson Eva, MD  oxycodone (OXY-IR) 5 MG capsule Take 1 capsule (5 mg total) by mouth every 4 (four) hours as needed. Patient not taking: Reported on 05/31/2022 05/17/22   [name], MD    Current Facility-Administered Medications  Medication Dose Route Frequency Provider Last Rate Last Admin   0.9 %  sodium chloride infusion (Manually program via Guardrails IV Fluids)   Intravenous Once Rodena Goldmann, DO   Held at 06/08/22 1517   acetaminophen (TYLENOL) tablet 650 mg  650 mg Oral Q6H PRN Kathie Dike, MD       Or   acetaminophen (TYLENOL) suppository 650 mg  650 mg Rectal Q6H PRN Kathie Dike, MD       amLODipine (NORVASC) tablet 10 mg  10 mg Oral Daily Kathie Dike, MD   10 mg at 06/08/22 1038   Chlorhexidine Gluconate Cloth 2 % PADS 6 each  6 each Topical Daily Heath Lark D, DO   6 each at 06/08/22 1441   diphenhydrAMINE (BENADRYL) injection 12.5 mg  12.5 mg Intravenous Q6H PRN Manuella Ghazi, Pratik D, DO       Or   diphenhydrAMINE (BENADRYL) 12.5 MG/5ML elixir 12.5 mg  12.5 mg Oral Q6H PRN Manuella Ghazi, Pratik D, DO       folic acid (FOLVITE) tablet 1 mg  1 mg Oral Daily Kathie Dike, MD   1 mg at 06/08/22 1037   HYDROmorphone (DILAUDID) 1 mg/mL PCA injection   Intravenous Q4H Shah, Pratik D, DO   Received at 06/09/22 0426   influenza vac split quadrivalent PF (FLUARIX) injection 0.5 mL  0.5 mL Intramuscular Tomorrow-1000 Kathie Dike, MD       lactated ringers infusion   Intravenous Continuous Kayleen Memos, DO 75 mL/hr at 06/09/22 0506 Infusion Verify at 06/09/22 0506   meclizine (ANTIVERT) tablet 25 mg  25 mg Oral  TID PRN Kathie Dike, MD   25 mg at 06/08/22 0442   naloxone (NARCAN) injection 0.4 mg  0.4 mg Intravenous PRN Manuella Ghazi, Pratik D, DO       And   sodium chloride flush (NS) 0.9 % injection 9 mL  9 mL Intravenous PRN Manuella Ghazi, Pratik D, DO       ondansetron (ZOFRAN) injection 4 mg  4 mg Intravenous Q6H PRN Manuella Ghazi, Pratik D, DO       Oral care mouth rinse  15 mL Mouth Rinse PRN Kathie Dike, MD       potassium chloride SA (KLOR-CON M) CR tablet 60 mEq  60 mEq Oral Daily Kathie Dike, MD  60 mEq at 06/08/22 1037   prochlorperazine (COMPAZINE) injection 10 mg  10 mg Intravenous Q6H PRN Kathie Dike, MD   10 mg at 06/08/22 0836    Allergies as of 05/31/2022   (No Known Allergies)    Social History   Socioeconomic History   Marital status: Married    Spouse name: Not on file   Number of children: Not on file   Years of education: Not on file   Highest education level: Not on file  Occupational History   Not on file  Tobacco Use   Smoking status: Every Day    Packs/day: 0.25    Years: 9.00    Total pack years: 2.25    Types: Cigarettes   Smokeless tobacco: Never  Vaping Use   Vaping Use: Never used  Substance and Sexual Activity   Alcohol use: Yes    Alcohol/week: 2.0 standard drinks of alcohol    Types: 2 Cans of beer per week   Drug use: No   Sexual activity: Yes    Birth control/protection: None  Other Topics Concern   Not on file  Social History Narrative   ** Merged History Encounter **       Social Determinants of Health   Financial Resource Strain: Not on file  Food Insecurity: No Food Insecurity (05/31/2022)   Hunger Vital Sign    Worried About Running Out of Food in the Last Year: Never true    Ran Out of Food in the Last Year: Never true  Transportation Needs: No Transportation Needs (05/31/2022)   PRAPARE - Hydrologist (Medical): No    Lack of Transportation (Non-Medical): No  Physical Activity: Not on file  Stress: Not on  file  Social Connections: Not on file  Intimate Partner Violence: Not At Risk (05/31/2022)   Humiliation, Afraid, Rape, and Kick questionnaire    Fear of Current or Ex-Partner: No    Emotionally Abused: No    Physically Abused: No    Sexually Abused: No    Review of Systems: Positive for 60 pounds weigh loss this year. All systems reviewed and negative except where noted in HPI.  Physical Exam: Vital signs in last 24 hours: Temp:  [98.2 F (36.8 C)-98.7 F (37.1 C)] 98.2 F (36.8 C) (11/04 0835) Pulse Rate:  [123-144] 123 (11/04 0313) Resp:  [16-29] 20 (11/04 0835) BP: (137-185)/(97-126) 158/108 (11/04 1020) SpO2:  [90 %-95 %] 91 % (11/04 0835) FiO2 (%):  [0 %-32 %] 0 % (11/03 2338) Weight:  [81.7 kg] 81.7 kg (11/03 1454) Last BM Date : 06/04/22  General:  Alert female in NAD Psych:  Pleasant, cooperative. Normal mood and affect Eyes: Pupils equal Ears:  Normal auditory acuity Nose: No deformity, discharge or lesions Neck:  Supple, no masses felt Lungs:  Decreased breath sounds in LUL and LLL.   Heart:  sinus tachycardia, no lower extremity edema Abdomen:  Soft, nondistended, mild LUQ tenderness. A few active bowel sounds, no masses felt Rectal :  Deferred Msk: Symmetrical without gross deformities.  Neurologic:  Alert, oriented, grossly normal neurologically Skin:  Intact without significant lesions.    Intake/Output from previous day: 11/03 0701 - 11/04 0700 In: 959.9 [I.V.:799.9; IV Piggyback:160] Out: -  Intake/Output this shift:  No intake/output data recorded.    Principal Problem:   Acute pancreatitis Active Problems:   Essential hypertension   Diarrhea   Splenic vein thrombosis   Liddle's syndrome   Spleen hematoma  Tye Savoy, NP-C @  06/09/2022, 11:10 AM

## 2022-06-09 NOTE — Progress Notes (Signed)
   06/09/22 2000  Assess: MEWS Score  Resp 20  SpO2 92 %  O2 Device Nasal Cannula  O2 Flow Rate (L/min) 5 L/min  Assess: MEWS Score  MEWS Temp 0  MEWS Systolic 0  MEWS Pulse 3  MEWS RR 0  MEWS LOC 0  MEWS Score 3  MEWS Score Color Yellow  Assess: if the MEWS score is Yellow or Red  Were vital signs taken at a resting state? Yes  Focused Assessment No change from prior assessment  Does the patient meet 2 or more of the SIRS criteria? No  MEWS guidelines implemented *See Row Information* No, previously yellow, continue vital signs every 4 hours  Treat  MEWS Interventions Other (Comment) (MD aware, this is not new)  Pain Score 7  Assess: SIRS CRITERIA  SIRS Temperature  0  SIRS Pulse 1  SIRS Respirations  0  SIRS WBC 1  SIRS Score Sum  2   No signs of acute distress, MD aware of tachycardia, pt denied chest pain, palpitations and SOB. Will continue to monitor pt closely.

## 2022-06-09 NOTE — Progress Notes (Signed)
Pt admitted from [facility] assisted by the EMS per stretcher. Pt denies chest pain, denies nausea and vomiting and not in respiratory distress. MD on call was notified.    06/08/22 2220  Vitals  Temp 98.5 F (36.9 C)  Temp Source Oral  BP (!) 159/111  BP Location Left Arm  BP Method Automatic  Patient Position (if appropriate) Lying  ECG Heart Rate (!) 145  Resp (!) 25  Level of Consciousness  Level of Consciousness Alert  MEWS COLOR  MEWS Score Color Red  Oxygen Therapy  SpO2 93 %  O2 Device Nasal Cannula  O2 Flow Rate (L/min) 4 L/min  Pain Assessment  Pain Scale 0-10  Pain Score 10  Pain Type Acute pain  Pain Location Abdomen  Pain Orientation Left;Upper  Pain Radiating Towards shoulder  Pain Descriptors / Indicators Sharp  Pain Frequency Constant  Pain Onset On-going  Patients Stated Pain Goal 2  Pain Intervention(s) Medication (See eMAR)  Multiple Pain Sites No  Height and Weight  Height '5\' 3"'$  (1.6 m)  Type of Scale Used Bed  Type of Weight Actual  Weight in (lb) to have BMI = 25 140.8  Glasgow Coma Scale  Eye Opening 4  Best Verbal Response (NON-intubated) 5  Best Motor Response 6  Glasgow Coma Scale Score 15  MEWS Score  MEWS Temp 0  MEWS Systolic 0  MEWS Pulse 3  MEWS RR 1  MEWS LOC 0  MEWS Score 4  Provider Notification  Provider Name/Title Dr. Nevada Crane  Date Provider Notified 06/08/22  Time Provider Notified [id number]  Method of Notification Page  Notification Reason Other (Comment) (MEWS red)  Steward Sedation Scale  Consciousness 2  Airway 2  Movement 2  Total Score 6

## 2022-06-09 NOTE — Progress Notes (Addendum)
Received a call from bedside RN regarding the patient having RED mews.  Reviewed the patient's chart.  She has been hypertensive and tachycardic in the high 130s.  Hypoxic requiring 4 L nasal cannula to maintain O2 saturation greater than 92%.  Reviewed CT scan done on 06/08/2022 which showed moderate left pleural effusion, consolidation, atelectasis,  Small volume pelvic ascites.  The patient was admitted on 05/31/22, and received adequate IV fluid hydration as indicated for acute pancreatitis.    Decreased IV fluid rate from 125 cc/h to 75 cc/h due to mild increased work of breathing, moderate left pleural effusion, and acute hypoxic respiratory failure.  Additionally 1 dose of IV Lopressor 5 mg x 1 ordered to be administered for BP 156/110 and heart rate of 142.  She is on PCA pump for abdominal pain.  Endorses use of tobacco half a pack per day for the past 8 years, quit 2 weeks ago.  Also quit use of alcohol weeks ago.  Alcohol and tobacco cessation counseling done at bedside, as they both work synergistically to contribute to pancreatitis.  Repeating labs to ensure stability of her hemoglobin in the setting of intra-abdominal hematoma.  We will continue to closely monitor and treat as indicated.   Time spent: 35 minutes.

## 2022-06-09 NOTE — Procedures (Signed)
PROCEDURE SUMMARY:  Successful US guided left thoracentesis. Yielded approximately 500cc of yellow pleural fluid. Pt tolerated procedure well. No immediate complications.  Specimen was sent for labs. CXR ordered.  EBL < 5 mL  Olie Dibert PA-C 06/09/2022 10:39 AM

## 2022-06-09 NOTE — Progress Notes (Signed)
PROGRESS NOTE    Jillian Shepherd  IFO:[id number] DOB: 27-May-1986 DOA: 05/31/2022 PCP: [name], MD   Brief Narrative:  The patient is a obese 36 year old Caucasian female with a past medical history significant for but not limited to previous alcohol use was admitted earlier to the hospital this month for acute alcoholic pancreatitis.  She was then discharged but then subsequently readmitted to the hospital on 05/31/2022 with worsening abdominal pain.  CT showed persistent pancreatitis and splenic vein thrombosis.  She was started on IV fluid hydration and pain management and anticoagulation was given.  GI is following.  This splenic vein thrombosis was likely a sequelae of pancreatitis.  She does noted to have some clinical deterioration and worsening leukocytosis.  Given this GI recommended repeating CT imaging and she was noted to have a splenic hematoma as well as a new pancreatic pseudocyst.  Procalcitonin was negative but patient continued to have worsening leukocytosis and she was given Kcentra for the reversal of Eliquis and repeat hemoglobin/hematocrit showed stability and vital signs being stable but she is continuing to be tachycardic.  General surgery was consulted and they recommended transferring to [facility] for further evaluation.  She is currently getting IV fluid hydration still in general surgery as well as GI following this hospitalization at [facility] now.  Currently she remains n.p.o. at surgery's recommendation.  Assessment and Plan: No notes have been filed under this hospital service. Service: Hospitalist  Acute on chronic pancreatitis-with worsening abdominal pain in the setting of splenic hematoma SIRS in the setting of pancreatitis -Patient reports that she has abstained from alcohol since her last discharge -Worsening pain may be related to splenic vein thrombosis which is likely a sequelae of pancreatitis -Appreciate ongoingGI input with plans to repeat CT  abdomen and consider initiation of antibiotics pending results. -Repeat CT scan done and showed "Interval development of 500+mL mixed-attenuation perisplenic/subcapsular splenic hematoma, without evident active extravasation. New 4.4 cm pseudocyst abutting the lateral wall of the gastric body. New moderate left pleural effusion and consolidation/atelectasis in the visualized dependent aspect of the left lung. Some improvement in peripancreatic inflammatory change with stable peripancreatic pseudocysts. Small volume pelvic ascites. Incomplete opacification of the splenic vein consistent with previously described splenic vein thrombosis." -Noted to have some clinical deterioration with worsening leukocytosis -Hemoglobin/hematocrit went from 12.6/35.8 and is now trended down to 10.4/29.2 -Patient will be n.p.o. again and start aggressive IV fluid hydration as well as pain management -CRP was elevated at 24.6 -Surgery recommending serial hemoglobin and pain control but they have no plans for acute operative intervention -GI recommending continue maintenance fluids and supportive care and will follow up imaging of the pancreas in a few weeks -Pain control via PCA pump  Acute respiratory failure with hypoxia in the setting of pleural effusion and volume overload -In the setting of above and she is requiring at least 4 L of nasal cannula to maintain O2 saturation greater 90% -Repeat CT scan was done on 11 3 which showed a moderate left-sided pleural effusion consolidation atelectasis as well as small volume pelvic ascites -Fluid hydration has been reduced from 125 MLS per hour to 75 MLS per hour due to her mild work of breathing and a thoracentesis was ordered and drained 500 MLS. -Fluid sent for analysis and showed total nucleated cell count was 3053 with cloudy appearance and neutrophil count being 62; Gram stain and fungus culture pending but currently showed no organisms -SpO2: 92 % O2 Flow Rate  (L/min):  4 L/min FiO2 (%): (!) 0 % -We will continue monitor patient's respiratory status carefully and will continue supplemental oxygen via nasal cannula wean O2 as tolerated -Continuous pulse oximetry maintain O2 saturation greater 90% -Prior to discharge she will need an ambulatory home O2 screen and we will repeat a chest x-ray in the morning  -Repeat chest x-ray today after thoracentesis showed "Moderate-sized potentially partially loculated residual left-sided effusion post thoracentesis. No pneumothorax. Left mid and lower lung heterogeneous/consolidative opacities, potentially atelectasis though worrisome for multifocal infection.  Suspected trace right-sided effusion with suspected mild pulmonary edema."  Hypomagnesemia -Patient's magnesium level is 1.4 -Replete with IV mag sulfate -Continue monitor and replete as necessary  Hypoalbuminemia -Patient's albumin level is now 2.0 -Continue to monitor and trend and repeat CMP in a.m.   Acute splenic vein thrombosis -Eliquis has been reversed with Kcentra given the splenic hematoma -Found on CT scan to have a subcapsular hematoma and was transferred to [facility] -Surgery following   Hypertension -Continue on amlodipine 10 mg daily   Liddle's syndrome -Chronically on spironolactone and potassium supplementation -We will hold off on Aldactone for now and replace potassiu with 60 mg daily  Loose stool -Likely chronic -Had a colonoscopy which showed no evidence of microscopic colitis or celiac disease in the work-up in 2022 -GI recommending obtaining fecal elastase in outpatient setting   Obesity -Complicates overall prognosis and care -Estimated body mass index is 31.91 kg/m as calculated from the following:   Height as of this encounter: '5\' 3"'$  (1.6 m).   Weight as of this encounter: 81.7 kg.  -Weight Loss and Dietary Counseling given  DVT prophylaxis: Place and maintain sequential compression device Start: 06/08/22  1450    Code Status: Full Code Family Communication: No family currently at bedside  Disposition Plan:  Level of care: Progressive Status is: Inpatient Remains inpatient appropriate because: Patient continues to have abdominal discomfort and is n.p.o. needs specialist clearance prior to safe discharge disposition    Consultants:  Gastroenterology General surgery  Procedures:  As above  Antimicrobials:  Anti-infectives (From admission, onward)    None       Subjective: Seen and examined at bedside and was having some left-sided abdominal discomfort.  Remained tachycardic and also complaining of some left back and flank pain.  No nausea or vomiting.  Denies any other concerns or complaints at this time but feels extremely weak  Objective: Vitals:   06/09/22 0835 06/09/22 1010 06/09/22 1020 06/09/22 1141  BP: (!) 153/99 (!) 158/108 (!) 158/108 (!) 148/97  Pulse:      Resp: 20   18  Temp: 98.2 F (36.8 C)   98.3 F (36.8 C)  TempSrc: Oral   Oral  SpO2: 91%   92%  Weight:      Height:        Intake/Output Summary (Last 24 hours) at 06/09/2022 1702 Last data filed at 06/09/2022 1640 Gross per 24 hour  Intake 1591.58 ml  Output --  Net 1591.58 ml   Filed Weights   05/31/22 0824 06/08/22 1454  Weight: 83.6 kg 81.7 kg   Examination: Physical Exam:  Constitutional: WN/WD obese Caucasian female currently in mild distress appears uncomfortable Respiratory: Diminished to auscultation bilaterally with coarse breath sounds worse on the left compared to the right, no wheezing, rales, rhonchi or crackles.  Has not elevated respiratory rate but no accessory muscle usage Cardiovascular: Tachycardic rate, no murmurs / rubs / gallops. S1 and S2 auscultated.  Has 1+  lower extremity edema Abdomen: Soft, tender to palpate and distended secondary body habitus.  Bowel sounds positive.  GU: Deferred. Musculoskeletal: No clubbing / cyanosis of digits/nails. No joint deformity upper  and lower extremities.  Skin: Has a bruise on her abdomen from her heparin/Lovenox injection Neurologic: CN 2-12 grossly intact with no focal deficits.  Romberg sign and cerebellar reflexes not assessed.  Psychiatric: Normal judgment and insight. Alert and oriented x 3. Normal mood and appropriate affect.   Data Reviewed: I have personally reviewed following labs and imaging studies  CBC: Recent Labs  Lab 06/04/22 0533 06/05/22 0419 06/06/22 0421 06/07/22 0415 06/08/22 0438 06/08/22 1354 06/08/22 2006 06/09/22 1353  WBC 8.7 7.7 8.7 11.5* 22.6*  --   --   --   HGB 12.8 12.9 12.9 14.0 13.3 12.8 12.6 10.4*  HCT 36.9 38.0 37.4 40.1 38.6 36.5 35.8* 29.2*  MCV 105.7* 107.0* 104.2* 102.6* 102.9*  --   --   --   PLT 330 329 359 394 503*  --   --   --    Basic Metabolic Panel: Recent Labs  Lab 06/05/22 0419 06/08/22 0438 06/09/22 0145 06/09/22 0558  NA 135 133*  --  134*  K 4.1 4.1  --  4.4  CL 99 95*  --  97*  CO2 27 23  --  27  GLUCOSE 86 196*  --  133*  BUN <5* 8  --  10  CREATININE 0.46 0.48  --  0.47  CALCIUM 8.8* 8.9  --  8.4*  MG  --   --   --  1.4*  PHOS  --   --  4.3  --    GFR: Estimated Creatinine Clearance: 97.4 mL/min (by C-G formula based on SCr of 0.47 mg/dL). Liver Function Tests: Recent Labs  Lab 06/09/22 0558  AST 14*  ALT 8  ALKPHOS 115  BILITOT 0.7  PROT 6.3*  ALBUMIN 2.0*   No results for input(s): "LIPASE", "AMYLASE" in the last 168 hours. No results for input(s): "AMMONIA" in the last 168 hours. Coagulation Profile: No results for input(s): "INR", "PROTIME" in the last 168 hours. Cardiac Enzymes: No results for input(s): "CKTOTAL", "CKMB", "CKMBINDEX", "TROPONINI" in the last 168 hours. BNP (last 3 results) No results for input(s): "PROBNP" in the last 8760 hours. HbA1C: No results for input(s): "HGBA1C" in the last 72 hours. CBG: No results for input(s): "GLUCAP" in the last 168 hours. Lipid Profile: No results for input(s): "CHOL",  "HDL", "LDLCALC", "TRIG", "CHOLHDL", "LDLDIRECT" in the last 72 hours. Thyroid Function Tests: No results for input(s): "TSH", "T4TOTAL", "FREET4", "T3FREE", "THYROIDAB" in the last 72 hours. Anemia Panel: No results for input(s): "VITAMINB12", "FOLATE", "FERRITIN", "TIBC", "IRON", "RETICCTPCT" in the last 72 hours. Sepsis Labs: Recent Labs  Lab 06/08/22 0438 06/09/22 0155 06/09/22 0558  PROCALCITON 0.29  --   --   LATICACIDVEN  --  1.0 0.8    Recent Results (from the past 240 hour(s))  MRSA Next Gen by PCR, Nasal     Status: None   Collection Time: 06/08/22  2:34 PM   Specimen: Nasal Mucosa; Nasal Swab  Result Value Ref Range Status   MRSA by PCR Next Gen NOT DETECTED NOT DETECTED Final    Comment: (NOTE) The GeneXpert MRSA Assay (FDA approved for NASAL specimens only), is one component of a comprehensive MRSA colonization surveillance program. It is not intended to diagnose MRSA infection nor to guide or monitor treatment for MRSA infections. Test performance is  not FDA approved in patients less than 54 years old. Performed at [facility], [address], [place], [place] [address]   Gram stain     Status: None   Collection Time: 06/09/22 10:56 AM   Specimen: Pleura  Result Value Ref Range Status   Specimen Description PLEURAL  Final   Special Requests NONE  Final   Gram Stain   Final    FEW WBC PRESENT,BOTH PMN AND MONONUCLEAR NO ORGANISMS SEEN Performed at [facility], [address], [place], [place] [address]    Report Status 06/09/2022 FINAL  Final     Radiology Studies: DG CHEST PORT 1 VIEW  Result Date: 06/09/2022 CLINICAL DATA:  Post left-sided thoracentesis. EXAM: PORTABLE CHEST 1 VIEW COMPARISON:  CT abdomen and pelvis-06/08/2022; chest radiograph-06/01/2022 FINDINGS: Moderate-sized potentially partially loculated residual left-sided pleural effusion post thoracentesis. No pneumothorax. There is obscuration of the left heart border secondary to  left-sided effusion associated left mid and lower lung heterogeneous/consolidative opacities. Trace right-sided pleural effusion is not excluded. Pulmonary vasculature is indistinct with cephalization of flow. No acute osseous abnormalities. IMPRESSION: 1. Moderate-sized potentially partially loculated residual left-sided effusion post thoracentesis. No pneumothorax. 2. Left mid and lower lung heterogeneous/consolidative opacities, potentially atelectasis though worrisome for multifocal infection. 3. Suspected trace right-sided effusion with suspected mild pulmonary edema. Electronically Signed   By: Sandi Mariscal M.D.   On: 06/09/2022 11:13   US THORACENTESIS ASP PLEURAL SPACE W/IMG GUIDE  Result Date: 06/09/2022 INDICATION: Left pleural effusion, shortness of breath EXAM: ULTRASOUND GUIDED LEFT THORACENTESIS MEDICATIONS: None. COMPLICATIONS: None immediate. PROCEDURE: An ultrasound guided thoracentesis was thoroughly discussed with the patient and questions answered. The benefits, risks, alternatives and complications were also discussed. The patient understands and wishes to proceed with the procedure. Written consent was obtained. Ultrasound was performed to localize and mark an adequate pocket of fluid in the left chest. The area was then prepped and draped in the normal sterile fashion. 1% Lidocaine was used for local anesthesia. Under ultrasound guidance a 6 Fr Safe-T-Centesis catheter was introduced. Thoracentesis was performed. The catheter was removed and a dressing applied. FINDINGS: A total of approximately 500 cc of pleural fluid was removed. Samples were sent to the laboratory as requested by the clinical team. IMPRESSION: Successful ultrasound guided left thoracentesis yielding 500 cc of pleural fluid. Performed and dictated by Pasty Spillers, PA-C Electronically Signed   By: Sandi Mariscal M.D.   On: 06/09/2022 11:09   CT ABDOMEN PELVIS W CONTRAST  Result Date: 06/08/2022 CLINICAL DATA:  Nausea,  vomiting, epigastric pain, splenic vein thrombosis, pancreatic necrosis, leukocytosis EXAM: CT ABDOMEN AND PELVIS WITH CONTRAST TECHNIQUE: Multidetector CT imaging of the abdomen and pelvis was performed using the standard protocol following bolus administration of intravenous contrast. RADIATION DOSE REDUCTION: This exam was performed according to the departmental dose-optimization program which includes automated exposure control, adjustment of the mA and/or kV according to patient size and/or use of iterative reconstruction technique. CONTRAST:  174m OMNIPAQUE IOHEXOL 300 MG/ML  SOLN COMPARISON:  05/31/2022 FINDINGS: Lower chest: New moderate left pleural effusion and consolidation/atelectasis in the visualized dependent aspect of the left lung. Trace right pleural fluid. No pericardial effusion. Hepatobiliary: Gallbladder is physiologically distended without calcified stones. No focal liver lesion or biliary ductal dilatation. Pancreas: Perhaps marginal improvement in the peripancreatic inflammatory change. Peripancreatic pseudocysts with dominant cluster near the body/tail junction are slightly better marginated, similar in size. There is a new well-marginated pseudocyst abutting the lateral wall of gastric  body, 4.4 x 2.7 cm. Pancreatic parenchymal enhancement stable. Spleen: Interval development of large mixed-attenuation perisplenic or subcapsular hematoma , margins difficult to differentiate from the spleen itself, the overall process measuring 14 x 10.2 x 9.9 cm (740cc) , compared to original spleen dimensions 11.8 x 4.1 x7.4 cm (187cc) on prior study. No convincing active extravasation on the single phase of the scan. Adrenals/Urinary Tract: No adrenal mass. Symmetric renal enhancement without focal lesion or hydronephrosis. Urinary bladder physiologically distended. Stomach/Bowel: Stomach is incompletely distended, with mucosal enhancement. The small bowel is decompressed. Normal appendix. Colon is  incompletely distended, unremarkable. Vascular/Lymphatic: No significant arterial pathology evident. Portal vein patent. Incomplete opacification of the splenic vein consistent with previously described splenic vein thrombosis. SMV is patent. Bilateral renal veins patent. No abdominal or pelvic adenopathy localized. Reproductive: Uterus and bilateral adnexa are unremarkable. Other: Small volume pelvic ascites. Infiltrative changes in the right upper quadrant and perisplenic mesenteric and retroperitoneal fat may be related to pancreatitis or the splenic hemorrhage. Musculoskeletal: No acute or significant osseous findings. IMPRESSION: 1. Interval development of 500+mL mixed-attenuation perisplenic/subcapsular splenic hematoma, without evident active extravasation. 2. New 4.4 cm pseudocyst abutting the lateral wall of the gastric body. 3. New moderate left pleural effusion and consolidation/atelectasis in the visualized dependent aspect of the left lung. 4. Some improvement in peripancreatic inflammatory change with stable peripancreatic pseudocysts. 5. Small volume pelvic ascites. 6. Incomplete opacification of the splenic vein consistent with previously described splenic vein thrombosis. Electronically Signed   By: Lucrezia Europe M.D.   On: 06/08/2022 13:32     Scheduled Meds:  sodium chloride   Intravenous Once   amLODipine  10 mg Oral Daily   Chlorhexidine Gluconate Cloth  6 each Topical Daily   folic acid  1 mg Oral Daily   HYDROmorphone   Intravenous Q4H   influenza vac split quadrivalent PF  0.5 mL Intramuscular Tomorrow-1000   lidocaine (PF)  6 mL Intradermal Once   potassium chloride SA  60 mEq Oral Daily   Continuous Infusions:  lactated ringers 75 mL/hr at 06/09/22 1639    LOS: 8 days   Raiford Noble, DO [facility] Available via Epic secure chat 7am-7pm After these hours, please refer to coverage provider listed on [email or web address] 06/09/2022, 5:02 PM

## 2022-06-09 NOTE — Progress Notes (Signed)
Seen and examined by Dr. Nevada Crane.

## 2022-06-09 NOTE — Consult Note (Signed)
Reason for Consult: acute alcoholic pancreatitis, splenic vein thrombosis, splenic hematoma  Referring Physician: Dr. Blake Divine, surgery, [facility]  Jillian Shepherd is an 36 y.o. female.  HPI: Patient is a 36 year old female transferred from [facility] to [facility] for management of acute alcoholic pancreatitis with necrosis and pseudocyst formation, splenic vein thrombosis, and development of splenic hematoma.  Patient is admitted to the medical service.  Surgery is asked to follow.  CT scan performed 06/08/2022 demonstrated the above findings.  Patient also has a significant left pleural effusion which is scheduled for thoracentesis later today.  Past Medical History:  Diagnosis Date   Abdominal pain    Alcohol abuse    B12 deficiency    Cancer ([place])    squmaous cell skin cancer    Collagen vascular disease (HCC)    Hypokalemia    Liddle's syndrome    Obesity (BMI 35.0-39.9 without comorbidity)    Pancreatitis     Past Surgical History:  Procedure Laterality Date   ADENOIDECTOMY     COLONOSCOPY WITH PROPOFOL N/A 10/06/2020   Procedure: COLONOSCOPY WITH PROPOFOL;  Surgeon: Lin Landsman, MD;  Location: [facility] ENDOSCOPY;  Service: Gastroenterology;  Laterality: N/A;  COVID POSITIVE 08/23/2020   ESOPHAGOGASTRODUODENOSCOPY (EGD) WITH PROPOFOL N/A 10/06/2020   Procedure: ESOPHAGOGASTRODUODENOSCOPY (EGD) WITH PROPOFOL;  Surgeon: Lin Landsman, MD;  Location: [place];  Service: Gastroenterology;  Laterality: N/A;   TONSILLECTOMY      Family History  Problem Relation Age of Onset   Melanoma Paternal Grandmother        of skin   Hypertension Father    Anxiety disorder Father    Depression Father     Social History:  reports that she has been smoking cigarettes. She has a 2.25 pack-year smoking history. She has never used smokeless tobacco. She reports current alcohol use of about 2.0 standard drinks of alcohol per week. She  reports that she does not use drugs.  Allergies: No Known Allergies  Medications: I have reviewed the patient's current medications.  Results for orders placed or performed during the hospital encounter of 05/31/22 (from the past 48 hour(s))  CBC     Status: Abnormal   Collection Time: 06/08/22  4:38 AM  Result Value Ref Range   WBC 22.6 (H) 4.0 - 10.5 K/uL   RBC 3.75 (L) 3.87 - 5.11 MIL/uL   Hemoglobin 13.3 12.0 - 15.0 g/dL   HCT 38.6 36.0 - 46.0 %   MCV 102.9 (H) 80.0 - 100.0 fL   MCH 35.5 (H) 26.0 - 34.0 pg   MCHC 34.5 30.0 - 36.0 g/dL   RDW 15.7 (H) 11.5 - 15.5 %   Platelets 503 (H) 150 - 400 K/uL   nRBC 0.0 0.0 - 0.2 %    Comment: Performed at [facility], [address] [place], [place] [address]  Basic metabolic panel     Status: Abnormal   Collection Time: 06/08/22  4:38 AM  Result Value Ref Range   Sodium 133 (L) 135 - 145 mmol/L   Potassium 4.1 3.5 - 5.1 mmol/L   Chloride 95 (L) 98 - 111 mmol/L   CO2 23 22 - 32 mmol/L   Glucose, Bld 196 (H) 70 - 99 mg/dL    Comment: Glucose reference range applies only to samples taken after fasting for at least 8 hours.   BUN 8 6 - 20 mg/dL   Creatinine, Ser 0.48 0.44 - 1.00 mg/dL  Calcium 8.9 8.9 - 10.3 mg/dL   GFR, Estimated >60 >60 mL/min    Comment: (NOTE) Calculated using the CKD-EPI Creatinine Equation (2021)    Anion gap 15 5 - 15    Comment: Performed at [facility], [address], [place], [place] [address]  Procalcitonin - Baseline     Status: None   Collection Time: 06/08/22  4:38 AM  Result Value Ref Range   Procalcitonin 0.29 ng/mL    Comment:        Interpretation: PCT (Procalcitonin) <= 0.5 ng/mL: Systemic infection (sepsis) is not likely. Local bacterial infection is possible. (NOTE)       Sepsis PCT Algorithm           Lower Respiratory Tract                                      Infection PCT Algorithm    ----------------------------     ----------------------------         PCT < 0.25 ng/mL                 PCT < 0.10 ng/mL          Strongly encourage             Strongly discourage   discontinuation of antibiotics    initiation of antibiotics    ----------------------------     -----------------------------       PCT 0.25 - 0.50 ng/mL            PCT 0.10 - 0.25 ng/mL               OR       >80% decrease in PCT            Discourage initiation of                                            antibiotics      Encourage discontinuation           of antibiotics    ----------------------------     -----------------------------         PCT >= 0.50 ng/mL              PCT 0.26 - 0.50 ng/mL               AND        <80% decrease in PCT             Encourage initiation of                                             antibiotics       Encourage continuation           of antibiotics    ----------------------------     -----------------------------        PCT >= 0.50 ng/mL                  PCT > 0.50 ng/mL               AND         increase in PCT  Strongly encourage                                      initiation of antibiotics    Strongly encourage escalation           of antibiotics                                     -----------------------------                                           PCT <= 0.25 ng/mL                                                 OR                                        > 80% decrease in PCT                                      Discontinue / Do not initiate                                             antibiotics  Performed at [facility], [address], [place], [place] [address]   C-reactive protein     Status: Abnormal   Collection Time: 06/08/22  9:48 AM  Result Value Ref Range   CRP 24.6 (H) <1.0 mg/dL    Comment: Performed at [facility] [address], [place], [place] [address]  Hemoglobin and hematocrit, blood     Status: None   Collection Time: 06/08/22  1:54 PM  Result Value Ref Range   Hemoglobin 12.8 12.0 - 15.0 g/dL   HCT  36.5 36.0 - 46.0 %    Comment: Performed at [facility], [address] [place], [place] [address]  ABO/Rh     Status: None   Collection Time: 06/08/22  1:54 PM  Result Value Ref Range   ABO/RH(D)      Jenetta Downer POS Performed at [facility], [address] [place], [place] [address]   MRSA Next Gen by PCR, Nasal     Status: None   Collection Time: 06/08/22  2:34 PM   Specimen: Nasal Mucosa; Nasal Swab  Result Value Ref Range   MRSA by PCR Next Gen NOT DETECTED NOT DETECTED    Comment: (NOTE) The GeneXpert MRSA Assay (FDA approved for NASAL specimens only), is one component of a comprehensive MRSA colonization surveillance program. It is not intended to diagnose MRSA infection nor to guide or monitor treatment for MRSA infections. Test performance is not FDA approved in patients less than 61 years old. Performed at [facility], [address] [place],  [address]   Type and screen [facility]  Status: None (Preliminary result)   Collection Time: 06/08/22  3:09 PM  Result Value Ref Range   ABO/RH(D) O POS    Antibody Screen NEG    Sample Expiration 06/11/2022,2359    Unit Number [id number]    Blood Component Type RED CELLS,LR    Unit division 00    Status of Unit ALLOCATED    Transfusion Status OK TO TRANSFUSE    Crossmatch Result Compatible    Unit Number [id number]    Blood Component Type RED CELLS,LR    Unit division 00    Status of Unit ALLOCATED    Transfusion Status OK TO TRANSFUSE    Crossmatch Result      Compatible Performed at [facility], [address], [place], [place] [address]   Prepare RBC (crossmatch)     Status: None   Collection Time: 06/08/22  3:16 PM  Result Value Ref Range   Order Confirmation      ORDER PROCESSED BY BLOOD BANK Performed at [facility], [address] [place], [place] [address]   Hemoglobin and hematocrit, blood     Status: Abnormal   Collection Time: 06/08/22  8:06 PM  Result Value Ref Range   Hemoglobin  12.6 12.0 - 15.0 g/dL   HCT 35.8 (L) 36.0 - 46.0 %    Comment: Performed at [facility], [address] [place], [place] [address]  Phosphorus     Status: None   Collection Time: 06/09/22  1:45 AM  Result Value Ref Range   Phosphorus 4.3 2.5 - 4.6 mg/dL    Comment: Performed at [facility], [place] [address] [place], [place] [address]  Lactic acid, plasma     Status: None   Collection Time: 06/09/22  1:55 AM  Result Value Ref Range   Lactic Acid, Venous 1.0 0.5 - 1.9 mmol/L    Comment: Performed at [facility] [address], [place], [place] [address]  Lactic acid, plasma     Status: None   Collection Time: 06/09/22  5:58 AM  Result Value Ref Range   Lactic Acid, Venous 0.8 0.5 - 1.9 mmol/L    Comment: Performed at [facility] [address], [place],  [address]    CT ABDOMEN PELVIS W CONTRAST  Result Date: 06/08/2022 CLINICAL DATA:  Nausea, vomiting, epigastric pain, splenic vein thrombosis, pancreatic necrosis, leukocytosis EXAM: CT ABDOMEN AND PELVIS WITH CONTRAST TECHNIQUE: Multidetector CT imaging of the abdomen and pelvis was performed using the standard protocol following bolus administration of intravenous contrast. RADIATION DOSE REDUCTION: This exam was performed according to the departmental dose-optimization program which includes automated exposure control, adjustment of the mA and/or kV according to patient size and/or use of iterative reconstruction technique. CONTRAST:  114m OMNIPAQUE IOHEXOL 300 MG/ML  SOLN COMPARISON:  05/31/2022 FINDINGS: Lower chest: New moderate left pleural effusion and consolidation/atelectasis in the visualized dependent aspect of the left lung. Trace right pleural fluid. No pericardial effusion. Hepatobiliary: Gallbladder is physiologically distended without calcified stones. No focal liver lesion or biliary ductal dilatation. Pancreas: Perhaps marginal improvement in the peripancreatic inflammatory change. Peripancreatic  pseudocysts with dominant cluster near the body/tail junction are slightly better marginated, similar in size. There is a new well-marginated pseudocyst abutting the lateral wall of gastric body, 4.4 x 2.7 cm. Pancreatic parenchymal enhancement stable. Spleen: Interval development of large mixed-attenuation perisplenic or subcapsular hematoma , margins difficult to differentiate from the spleen itself, the overall process measuring 14 x 10.2 x  9.9 cm (740cc) , compared to original spleen dimensions 11.8 x 4.1 x7.4 cm (187cc) on prior study. No convincing active extravasation on the single phase of the scan. Adrenals/Urinary Tract: No adrenal mass. Symmetric renal enhancement without focal lesion or hydronephrosis. Urinary bladder physiologically distended. Stomach/Bowel: Stomach is incompletely distended, with mucosal enhancement. The small bowel is decompressed. Normal appendix. Colon is incompletely distended, unremarkable. Vascular/Lymphatic: No significant arterial pathology evident. Portal vein patent. Incomplete opacification of the splenic vein consistent with previously described splenic vein thrombosis. SMV is patent. Bilateral renal veins patent. No abdominal or pelvic adenopathy localized. Reproductive: Uterus and bilateral adnexa are unremarkable. Other: Small volume pelvic ascites. Infiltrative changes in the right upper quadrant and perisplenic mesenteric and retroperitoneal fat may be related to pancreatitis or the splenic hemorrhage. Musculoskeletal: No acute or significant osseous findings. IMPRESSION: 1. Interval development of 500+mL mixed-attenuation perisplenic/subcapsular splenic hematoma, without evident active extravasation. 2. New 4.4 cm pseudocyst abutting the lateral wall of the gastric body. 3. New moderate left pleural effusion and consolidation/atelectasis in the visualized dependent aspect of the left lung. 4. Some improvement in peripancreatic inflammatory change with stable  peripancreatic pseudocysts. 5. Small volume pelvic ascites. 6. Incomplete opacification of the splenic vein consistent with previously described splenic vein thrombosis. Electronically Signed   By: Lucrezia Europe M.D.   On: 06/08/2022 13:32    Review of Systems  Constitutional:  Positive for appetite change.  HENT: Negative.    Eyes: Negative.   Respiratory:  Positive for shortness of breath.   Cardiovascular: Negative.   Gastrointestinal:  Positive for abdominal distention and abdominal pain.  Endocrine: Negative.   Genitourinary: Negative.   Musculoskeletal: Negative.   Skin: Negative.   Allergic/Immunologic: Negative.   Neurological: Negative.   Hematological: Negative.   Psychiatric/Behavioral: Negative.      Physical Exam  Blood pressure (!) 153/99, pulse (!) 123, temperature 98.2 F (36.8 C), temperature source Oral, resp. rate 20, height '5\' 3"'$  (1.6 m), weight 81.7 kg, last menstrual period 05/01/2022, SpO2 91 %.  CONSTITUTIONAL: no acute distress; conversant; no obvious deformities  EYES: Conjunctiva clear and moist; pupils equal bilaterally  NECK: trachea midline; no thyroid nodularity  LUNGS: Clear to auscultation on right; diminished breath sounds left lung fields anteriorly  CV: Tachycardic; no significant murmur; no edema bilat lower extremities  GI: abdomen is soft with mild to moderate distention; area of ecchymosis mid left abdominal wall; mild diffuse tenderness, more on the left than on the right; no palpable masses; no guarding  MSK: normal range of motion of extremities; no clubbing; no cyanosis  PSYCH: appropriate affect for situation; alert and oriented to person, place, & time  LYMPHATIC: no palpable cervical lymphadenopathy    Assessment/Plan:  Acute alcoholic pancreatitis Splenic vein thrombosis Splenic hematoma Pseudocyst formation Left pleural effusion  NPO, swabs to mouth for comfort IVF Serial Hgb determinations Thoracentesis  scheduled per medical service Pain Rx  Will follow with you.  No plans for acute operative intervention.  [name], MD [facility] [place] [facility] Office: [phone]   [name] 06/09/2022, 9:08 AM

## 2022-06-10 ENCOUNTER — Inpatient Hospital Stay (HOSPITAL_COMMUNITY): Payer: 59

## 2022-06-10 ENCOUNTER — Encounter (HOSPITAL_COMMUNITY): Payer: Self-pay | Admitting: Internal Medicine

## 2022-06-10 DIAGNOSIS — J189 Pneumonia, unspecified organism: Secondary | ICD-10-CM

## 2022-06-10 DIAGNOSIS — J9601 Acute respiratory failure with hypoxia: Secondary | ICD-10-CM | POA: Diagnosis not present

## 2022-06-10 DIAGNOSIS — K859 Acute pancreatitis without necrosis or infection, unspecified: Secondary | ICD-10-CM | POA: Diagnosis not present

## 2022-06-10 DIAGNOSIS — R0781 Pleurodynia: Secondary | ICD-10-CM | POA: Diagnosis not present

## 2022-06-10 DIAGNOSIS — J90 Pleural effusion, not elsewhere classified: Secondary | ICD-10-CM | POA: Diagnosis not present

## 2022-06-10 DIAGNOSIS — R197 Diarrhea, unspecified: Secondary | ICD-10-CM | POA: Diagnosis not present

## 2022-06-10 DIAGNOSIS — I151 Hypertension secondary to other renal disorders: Secondary | ICD-10-CM | POA: Diagnosis not present

## 2022-06-10 DIAGNOSIS — I10 Essential (primary) hypertension: Secondary | ICD-10-CM | POA: Diagnosis not present

## 2022-06-10 LAB — BLOOD GAS, ARTERIAL
Acid-Base Excess: 8.6 mmol/L — ABNORMAL HIGH (ref 0.0–2.0)
Bicarbonate: 34.1 mmol/L — ABNORMAL HIGH (ref 20.0–28.0)
Drawn by: 39899
O2 Saturation: 100 %
Patient temperature: 37
pCO2 arterial: 49 mmHg — ABNORMAL HIGH (ref 32–48)
pH, Arterial: 7.45 (ref 7.35–7.45)
pO2, Arterial: 139 mmHg — ABNORMAL HIGH (ref 83–108)

## 2022-06-10 LAB — COMPREHENSIVE METABOLIC PANEL
ALT: 8 U/L (ref 0–44)
AST: 12 U/L — ABNORMAL LOW (ref 15–41)
Albumin: 2 g/dL — ABNORMAL LOW (ref 3.5–5.0)
Alkaline Phosphatase: 100 U/L (ref 38–126)
Anion gap: 10 (ref 5–15)
BUN: 13 mg/dL (ref 6–20)
CO2: 27 mmol/L (ref 22–32)
Calcium: 8.5 mg/dL — ABNORMAL LOW (ref 8.9–10.3)
Chloride: 97 mmol/L — ABNORMAL LOW (ref 98–111)
Creatinine, Ser: 0.34 mg/dL — ABNORMAL LOW (ref 0.44–1.00)
GFR, Estimated: >60 mL/min (ref >60)
Glucose, Bld: 136 mg/dL — ABNORMAL HIGH (ref 70–99)
Potassium: 4.1 mmol/L (ref 3.5–5.1)
Sodium: 134 mmol/L — ABNORMAL LOW (ref 135–145)
Total Bilirubin: 0.7 mg/dL (ref 0.3–1.2)
Total Protein: 6.4 g/dL — ABNORMAL LOW (ref 6.5–8.1)

## 2022-06-10 LAB — PHOSPHORUS: Phosphorus: 3.3 mg/dL (ref 2.5–4.6)

## 2022-06-10 LAB — HEMOGLOBIN AND HEMATOCRIT, BLOOD
HCT: 29.5 % — ABNORMAL LOW (ref 36.0–46.0)
HCT: 27.6 % — ABNORMAL LOW (ref 36.0–46.0)
HCT: 26.6 % — ABNORMAL LOW (ref 36.0–46.0)
Hemoglobin: 10.4 g/dL — ABNORMAL LOW (ref 12.0–15.0)
Hemoglobin: 9.8 g/dL — ABNORMAL LOW (ref 12.0–15.0)
Hemoglobin: 9.4 g/dL — ABNORMAL LOW (ref 12.0–15.0)

## 2022-06-10 LAB — CBC WITH DIFFERENTIAL/PLATELET
Abs Immature Granulocytes: 0.14 10*3/uL — ABNORMAL HIGH (ref 0.00–0.07)
Basophils Absolute: 0 10*3/uL (ref 0.0–0.1)
Basophils Relative: 0 %
Eosinophils Absolute: 0 10*3/uL (ref 0.0–0.5)
Eosinophils Relative: 0 %
HCT: 27.3 % — ABNORMAL LOW (ref 36.0–46.0)
Hemoglobin: 9.8 g/dL — ABNORMAL LOW (ref 12.0–15.0)
Immature Granulocytes: 1 %
Lymphocytes Relative: 9 %
Lymphs Abs: 2 10*3/uL (ref 0.7–4.0)
MCH: 35.9 pg — ABNORMAL HIGH (ref 26.0–34.0)
MCHC: 35.9 g/dL (ref 30.0–36.0)
MCV: 100 fL (ref 80.0–100.0)
Monocytes Absolute: 1.4 10*3/uL — ABNORMAL HIGH (ref 0.1–1.0)
Monocytes Relative: 7 %
Neutro Abs: 17.5 10*3/uL — ABNORMAL HIGH (ref 1.7–7.7)
Neutrophils Relative %: 83 %
Platelets: 482 10*3/uL — ABNORMAL HIGH (ref 150–400)
RBC: 2.73 MIL/uL — ABNORMAL LOW (ref 3.87–5.11)
RDW: 16.9 % — ABNORMAL HIGH (ref 11.5–15.5)
WBC: 21.1 10*3/uL — ABNORMAL HIGH (ref 4.0–10.5)
nRBC: 0 % (ref 0.0–0.2)

## 2022-06-10 LAB — MAGNESIUM: Magnesium: 2.1 mg/dL (ref 1.7–2.4)

## 2022-06-10 LAB — TSH: TSH: 2.265 u[IU]/mL (ref 0.350–4.500)

## 2022-06-10 LAB — LIPASE, BLOOD: Lipase: 25 U/L (ref 11–51)

## 2022-06-10 LAB — PROCALCITONIN: Procalcitonin: 0.21 ng/mL

## 2022-06-10 MED ORDER — SODIUM CHLORIDE 0.9% FLUSH
10.0000 mL | Freq: Three times a day (TID) | INTRAVENOUS | Status: DC
  Administered 2022-06-10 – 2022-06-13 (×9): 10 mL via INTRAPLEURAL

## 2022-06-10 MED ORDER — MAGNESIUM SULFATE IN D5W 1-5 GM/100ML-% IV SOLN
1.0000 g | INTRAVENOUS | Status: AC
  Administered 2022-06-10: 1 g via INTRAVENOUS
  Filled 2022-06-10: qty 100

## 2022-06-10 MED ORDER — VANCOMYCIN HCL 1500 MG/300ML IV SOLN
1500.0000 mg | Freq: Once | INTRAVENOUS | Status: AC
  Administered 2022-06-10: 1500 mg via INTRAVENOUS
  Filled 2022-06-10: qty 300

## 2022-06-10 MED ORDER — HYDROMORPHONE HCL 1 MG/ML IJ SOLN
0.5000 mg | INTRAMUSCULAR | Status: DC | PRN
  Administered 2022-06-11 – 2022-06-12 (×2): 0.5 mg via INTRAVENOUS
  Filled 2022-06-10 (×2): qty 1

## 2022-06-10 MED ORDER — PANTOPRAZOLE SODIUM 40 MG IV SOLR
40.0000 mg | INTRAVENOUS | Status: AC
  Administered 2022-06-10 – 2022-06-11 (×2): 40 mg via INTRAVENOUS
  Filled 2022-06-10 (×2): qty 10

## 2022-06-10 MED ORDER — SODIUM CHLORIDE 0.9 % IV SOLN
2.0000 g | Freq: Three times a day (TID) | INTRAVENOUS | Status: DC
  Administered 2022-06-10 – 2022-06-14 (×13): 2 g via INTRAVENOUS
  Filled 2022-06-10 (×13): qty 12.5

## 2022-06-10 MED ORDER — METOPROLOL TARTRATE 5 MG/5ML IV SOLN
5.0000 mg | INTRAVENOUS | Status: AC
  Administered 2022-06-10: 5 mg via INTRAVENOUS
  Filled 2022-06-10: qty 5

## 2022-06-10 MED ORDER — MAGNESIUM SULFATE 4 GM/100ML IV SOLN: 4.0000 g | INTRAVENOUS | Status: DC

## 2022-06-10 MED ORDER — KETOROLAC TROMETHAMINE 30 MG/ML IJ SOLN
30.0000 mg | Freq: Three times a day (TID) | INTRAMUSCULAR | Status: AC
  Administered 2022-06-10 – 2022-06-11 (×3): 30 mg via INTRAVENOUS
  Filled 2022-06-10 (×3): qty 1

## 2022-06-10 MED ORDER — HYDROMORPHONE HCL 1 MG/ML IJ SOLN
1.0000 mg | INTRAMUSCULAR | Status: DC
  Administered 2022-06-10 – 2022-06-13 (×20): 1 mg via INTRAVENOUS
  Filled 2022-06-10 (×20): qty 1

## 2022-06-10 MED ORDER — FUROSEMIDE 10 MG/ML IJ SOLN
40.0000 mg | Freq: Once | INTRAMUSCULAR | Status: AC
  Administered 2022-06-10: 40 mg via INTRAVENOUS
  Filled 2022-06-10: qty 4

## 2022-06-10 MED ORDER — HYDROMORPHONE HCL 1 MG/ML IJ SOLN: 1.0000 mg | INTRAMUSCULAR | Status: DC | PRN

## 2022-06-10 MED ORDER — VANCOMYCIN HCL 750 MG/150ML IV SOLN
750.0000 mg | Freq: Two times a day (BID) | INTRAVENOUS | Status: DC
  Administered 2022-06-11 (×3): 750 mg via INTRAVENOUS
  Filled 2022-06-10 (×5): qty 150

## 2022-06-10 NOTE — Progress Notes (Signed)
   06/09/22 2355  Assess: MEWS Score  Resp 20  SpO2 92 %  O2 Device Nasal Cannula  O2 Flow Rate (L/min) 5 L/min  FiO2 (%) 21 %  Assess: MEWS Score  MEWS Temp 0  MEWS Systolic 0  MEWS Pulse 3  MEWS RR 0  MEWS LOC 0  MEWS Score 3  MEWS Score Color Yellow  Assess: if the MEWS score is Yellow or Red  Were vital signs taken at a resting state? Yes  Focused Assessment No change from prior assessment  Does the patient meet 2 or more of the SIRS criteria? No  MEWS guidelines implemented *See Row Information* No, previously yellow, continue vital signs every 4 hours  Treat  Pain Score 6  Notify: Provider  Provider Name/Title Dr. Nevada Crane  Date Provider Notified 06/10/22  Time Provider Notified 0002  Method of Notification Page  Notification Reason Other (Comment)  Provider response See new orders  Date of Provider Response 06/10/22  Time of Provider Response 0025  Assess: SIRS CRITERIA  SIRS Temperature  0  SIRS Pulse 1  SIRS Respirations  0  SIRS WBC 1  SIRS Score Sum  2

## 2022-06-10 NOTE — Consult Note (Signed)
NAME:  Jillian Shepherd, MRN:  [id number], DOB:  09/14/1985, LOS: 9 ADMISSION DATE:  05/31/2022, CONSULTATION DATE:  11/5 REFERRING MD:  Alfredia Ferguson, CHIEF COMPLAINT:  acute hypoxic resp failure  History of Present Illness:  36 year old female patient critical care asked to evaluate acutely on 11/5 for acute worsening of hypoxic respiratory failure.  She was initially hospitalized for acute pancreatitis earlier in October 2023, then subsequently readmitted on 10/26 with worsening abdominal pain.  She was found to have splenic thrombosis, and was placed on anticoagulation.  She been treated medically, over the course of her hospitalization she has had worsening abdominal pain, developed splenic hematoma, as well as progression of pancreatic pseudocyst.  She required reversal of anticoagulation with Kcentra, and since her hemoglobin is stabilized.  She has been followed by general surgery as well as gastroenterology.  However more frequently over the last couple days she has had progressive respiratory failure requiring escalating supplemental oxygen needs.  A CT scan was done on 11/3 that showed a moderate left-sided pleural effusion fairly significant left-sided atelectasis and small amount of abdominal ascites.  She underwent a therapeutic and diagnostic thoracentesis on 11/4 with fluid was exudative by lights criteria.  Over the course of the evening her oxygen requirements have continued to improve she is now on 100% FiO2 as well as heated high flow, portable chest x-ray shows reaccumulation of left-sided pleural effusion that appears loculated, critical care asked to evaluate for progressive respiratory failure.  Pertinent  Medical History   Alcohol abuse, acute alcoholic pancreatitis, splenic vein thrombosis, Significant Hospital Events: Including procedures, antibiotic start and stop dates in addition to other pertinent events   10/28 admitted for worsening abd pain w/ splenic vein thrombosis as well  as worsening pancreatitis and pseudocyst. IVFs started. Started on [facility].  11/1-11/3 worsening pain. CT of the abdomen pelvis with IV contrast was ordered stat which showed presence of new perisplenic hematoma without active extravasation and a 4.4 cm collection in the lateral wall of the gastric body consistent with possible pseudocyst. Got K centra. AC stopped 11/4 incr'd O2 needs. Thora 500 ml, exudate by lights criteria 11/5 marked increase in O2 needs. High flow and FM. CXR w/ marked inc Left sided effusion. Pt placed on HFNC. Left chest tube placed abx started for HCAP coverage (vanc and cefepime)   Interim History / Subjective:  Feels better after CT   Objective   Blood pressure (Abnormal) 143/100, pulse (Abnormal) 123, temperature 98.1 F (36.7 C), temperature source Oral, resp. rate 20, height '5\' 3"'$  (1.6 m), weight 81.7 kg, last menstrual period 05/01/2022, SpO2 (Abnormal) 89 %.    FiO2 (%):  [21 %] 21 %   Intake/Output Summary (Last 24 hours) at 06/10/2022 0954 Last data filed at 06/10/2022 0315 Gross per 24 hour  Intake 1513.92 ml  Output 500 ml  Net 1013.92 ml   Filed Weights   05/31/22 0824 06/08/22 1454  Weight: 83.6 kg 81.7 kg    Examination: General: 36 year old female resting In bed Looks a little more comfortable following CT  HENT: NCAT no JVD MMM Lungs: dec bases. Now has left CT w/ good tidal. Had brief air leak. ~ 1 liter out thus far. Currently  on 25 liters heated high flow. 100% Cardiovascular: tachy rrr no MRG  Abdomen: soft tender + bowel sounds  Extremities: warm and dry trace edema  Neuro: awake and oriented  GU: cl yellow   Resolved Hospital Problem list     Assessment &  Plan:  Acute on chronic alcoholic pancreatitis w/ pseudocyst.  Followed by general surg. Lipase improving  Plan Bowel rest Pain control  Imaging to be determined by surg team  Splenic vein thrombus 2/2 pancreatitis complicated by splenic hematoma s/p  anticoagulation Plan Holding ac Serial cbcs Transfuse for hgb < 7 or evidence of active bleeding  Acute hypoxic respiratory failure 2/2 large left pleural effusion w/ atelectasis vs infiltrate.  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5*  --   HGB 12.9 12.9 14.0 13.3   < > 10.4* 10.4* 10.1* 9.8* 10.4*  HCT 38.0 37.4 40.1 38.6   < > 29.2* 29.2* 28.4* 27.3* 29.5*  MCV 107.0* 104.2* 102.6* 102.9*  --   --   --   --  100.0  --   PLT 329 359 394 503*  --   --   --   --  482*  --    < > = values in this interval not displayed.    Basic Metabolic Panel: Recent Labs  Lab 06/05/22 0419 06/08/22 0438 06/09/22 0145 06/09/22 0558 06/10/22 0539  NA 135 133*  --  134* 134*  K 4.1 4.1  --  4.4 4.1  CL 99 95*  --  97* 97*  CO2 27 23  --  27 27  GLUCOSE 86 196*  --  133* 136*  BUN <5* 8  --  10 13  CREATININE 0.46 0.48  --  0.47 0.34*  CALCIUM 8.8* 8.9  --  8.4* 8.5*  MG  --   --   --  1.4* 2.1  PHOS  --   --  4.3  --  3.3   GFR: Estimated Creatinine Clearance: 97.4 mL/min (A) (by C-G formula based on SCr of 0.34 mg/dL (L)). Recent Labs  Lab 06/06/22 0421 06/07/22 0415  06/08/22 0438 06/09/22 0155 06/09/22 0558 06/10/22 0539  PROCALCITON  --   --  0.29  --   --  0.21  WBC 8.7 11.5* 22.6*  --   --  21.1*  LATICACIDVEN  --   --   --  1.0 0.8  --     Liver Function Tests: Recent Labs  Lab 06/09/22 0558 06/10/22 0539  AST 14* 12*  ALT 8 8  ALKPHOS 115 100  BILITOT 0.7 0.7  PROT 6.3* 6.4*  ALBUMIN 2.0* 2.0*   Recent Labs  Lab 06/10/22 0539  LIPASE 25   No results for input(s): "AMMONIA" in the last 168 hours.  ABG  Component Value Date/Time   PHART 7.45 06/10/2022 0830   PCO2ART 49 (H) 06/10/2022 0830   PO2ART 139 (H) 06/10/2022 0830   HCO3 34.1 (H) 06/10/2022 0830   O2SAT 100 06/10/2022 0830     Coagulation Profile: No results for input(s): "INR", "PROTIME" in the last 168 hours.  Cardiac Enzymes: No results for input(s): "CKTOTAL", "CKMB", "CKMBINDEX", "TROPONINI" in the last 168 hours.  HbA1C: Hgb A1c MFr Bld  Date/Time Value Ref Range Status  05/02/2022 09:03 AM 5.3 <5.7 % of total Hgb Final    Comment:    For the purpose of screening for the presence of diabetes: . <5.7%       Consistent with the absence of diabetes 5.7-6.4%    Consistent with increased risk for diabetes             (prediabetes) > or =6.5%  Consistent with diabetes . This assay result is consistent with a decreased risk of diabetes. . Currently, no consensus exists regarding use of hemoglobin A1c for diagnosis of diabetes in children. . According to American Diabetes Association (ADA) guidelines, hemoglobin A1c <7.0% represents optimal control in non-pregnant diabetic patients. Different metrics may apply to specific patient populations.  Standards of Medical Care in Diabetes(ADA). .     CBG: No results for input(s): "GLUCAP" in the last 168 hours.  Review of Systems:   Review of Systems  Constitutional: Negative.   HENT: Negative.    Eyes: Negative.   Respiratory:  Positive for cough and shortness of breath.   Cardiovascular:   Positive for chest pain.  Gastrointestinal:  Positive for abdominal pain and heartburn.  Genitourinary: Negative.   Skin: Negative.   Endo/Heme/Allergies: Negative.      Past Medical History:  She,  has a past medical history of Abdominal pain, Alcohol abuse, B12 deficiency, Cancer ([place]), Collagen vascular disease ([place]), Hypokalemia, Liddle's syndrome, Obesity (BMI 35.0-39.9 without comorbidity), and Pancreatitis.   Surgical History:   Past Surgical History:  Procedure Laterality Date   ADENOIDECTOMY     COLONOSCOPY WITH PROPOFOL N/A 10/06/2020   Procedure: COLONOSCOPY WITH PROPOFOL;  Surgeon: Lin Landsman, MD;  Location: [facility] ENDOSCOPY;  Service: Gastroenterology;  Laterality: N/A;  COVID POSITIVE 08/23/2020   ESOPHAGOGASTRODUODENOSCOPY (EGD) WITH PROPOFOL N/A 10/06/2020   Procedure: ESOPHAGOGASTRODUODENOSCOPY (EGD) WITH PROPOFOL;  Surgeon: Lin Landsman, MD;  Location: [facility];  Service: Gastroenterology;  Laterality: N/A;   TONSILLECTOMY       Social History:   reports that she has been smoking cigarettes. She has a 2.25 pack-year smoking history. She has never used smokeless tobacco. She reports current alcohol use of about 2.0 standard drinks of alcohol per week. She reports that she does not use drugs.   Family History:  Her family history includes Anxiety disorder in her father; Depression in her father; Hypertension in her father; Melanoma in her paternal grandmother.   Allergies No Known Allergies   Home Medications  Prior to Admission medications   Medication Sig Start Date End Date Taking? Authorizing Provider  amLODipine (NORVASC) 10 MG tablet Take 1 tablet (10 mg total) by mouth daily. 04/10/22  Yes Howard, Amber S, FNP  cyanocobalamin (VITAMIN B12) 1000 MCG/ML injection Inject 1 mL (1,000 mcg total) into the muscle every 30 (thirty) days. 05/17/22  Yes [name], MD  folic acid (FOLVITE) 1 MG tablet Take 1 tablet (1 mg total) by mouth daily.  05/17/22 05/12/23 Yes [name], MD  metFORMIN (GLUCOPHAGE) 500 MG tablet Take  1 tablet (500 mg total) by mouth 2 (two) times daily with a meal. 05/18/22  Yes Pickard, Cammie Mcgee, MD  potassium chloride SA (KLOR-CON M20) 20 MEQ tablet Take 3 tablets (60 mEq total) by mouth daily. Patient taking differently: Take 20 mEq by mouth daily. 05/21/22  Yes Mila Merry S, FNP  spironolactone (ALDACTONE) 25 MG tablet Take 1 tablet (25 mg total) by mouth daily. Patient taking differently: Take 25 mg by mouth 2 (two) times daily. 05/13/22  Yes Tat, Shanon Brow, MD  cefdinir (OMNICEF) 300 MG capsule Take 1 capsule (300 mg total) by mouth 2 (two) times daily. Patient not taking: Reported on 05/31/2022 05/13/22   Orson Eva, MD  doxycycline (VIBRA-TABS) 100 MG tablet Take 1 tablet (100 mg total) by mouth every 12 (twelve) hours. Patient not taking: Reported on 05/31/2022 05/13/22   Orson Eva, MD  oxycodone (OXY-IR) 5 MG capsule Take 1 capsule (5 mg total) by mouth every 4 (four) hours as needed. Patient not taking: Reported on 05/31/2022 05/17/22   [name], MD     Critical care time: 59 min     Erick Colace ACNP-BC [facility] Pager # [phone] OR # [phone] if no answer

## 2022-06-10 NOTE — Progress Notes (Signed)
Interventional Radiology Brief Note:  Jillian Shepherd is a 36 year old female with history of Liddle's syndrome, recurrent alcoholic pancreatitis who presented to [facility] with abdominal pain. CT imaging showed acute pancreatitis with possible developing pseudocyst, acute splenic vein thrombosis.  She was transferred to [facility] for ongoing GI care.  She has developed ascites in the setting of pancreatitis with right hydrothorax.  She underwent thoracentesis 11/4, however only 500 mL removed due to loculations.  She is now s/p chest tube placement by CCM.  IR consulted by GI for possible intervention regarding her splenic vein thrombosis.  Dr. Pascal Lux has reviewed imaging and notes the splenic vein is completely occluded.  There is no role for recanalization of the splenic vein.  Consideration could be made for splenic embolization if surgery team deems patient is +/- candidate for splenectomy given the potential variable outcomes of embolization in this patient.   Dr. Pascal Lux has discussed with Dr. Silverio Decamp.   Jillian Greathouse, MS RD PA-C

## 2022-06-10 NOTE — Progress Notes (Addendum)
Daily Progress Note  Hospital Day: 11  Chief Complaint: pancreatitis   Attending physician's note   I have taken a history, reviewed the chart and examined the patient. I performed a substantive portion of this encounter, including complete performance of at least one of the key components, in conjunction with the APP. I agree with the APP's note, impression and recommendations.    S/p chest tube placement for recurrent exudative pleural effusion Acute pancreatitis complicated by pseudocyst, splenic vein thrombosis and splenic hematoma  Pleural fluid likely secondary to hydropneumothorax from ascites fluid in the setting of pancreatitis and splenic vein thrombus Got started on broad-spectrum antibiotics, has persistent leukocytosis  Please request IR to review images and see if there is any possibility for splenic vein thrombectomy, she cannot be started back on anticoagulation given perisplenic hematoma  She is currently n.p.o., will need postpyloric tube feeds to prevent protein calorie malnutrition Nutrition consult  Continue supportive care and pain management per primary team  The patient was provided an opportunity to ask questions and all were answered. The patient agreed with the plan and demonstrated an understanding of the instructions.   Damaris Hippo , MD [phone]    Addendum:  Dr Pascal Lux (IR) called to discuss the case after he reviewed images. Not a candidate for splenic vein thrombectomy. Can consider splenic embolization if has recurrent bleeding and is not a surgical candidate for splenectomy   Brief History # 36 yo female with pmh of Liddle's syndrome, chronic loose stool, recurrent acute pancreatitis ( presumed Etoh). Seen in consultation by [place] on 11/4. She had been transferred here from [facility]  for further management of acute pancreatitis complicated by possible developing pseudocyst (vrs area of necrosis) , acute SV thrombosis and splenic  hematoma ( after starting Eliquis for SV thrombosis).  Assessment / Plan   # Acute pancreatitis complicated by developing pseudocyst vrs area of necrosis , acute SV thrombosis and splenic hematoma ( after starting Eliquis) Received Kcentra.  Hgb stable at 10.4 Anti-coagulation on hold.  May need post-pyloric TF soon  # Acute respiratory failure secondary to L pleural effusion.  Got 500 ml thoracentesis yesterday ( Exudative). Gram stain negative for organisms. Culture pending CXR today without improvement. Having increasing 02 needs / increased WOB. PCCM saw her this am and just placed L chest tube and started abx  # Chronic loose stool. No evidence for microscopic colitis or celiac disease on workup in 2022. Possibly EPI?  fecal elastase can be obtained by her outpatient GI. Last fecal elastase in 2021 was normal    Subjective   No significant abdominal pain, just feels " a little tight"  Objective   Imaging:  DG Abd 1 View  Result Date: 06/10/2022 CLINICAL DATA:  Abdominal pain and distension EXAM: ABDOMEN - 1 VIEW COMPARISON:  None Available. FINDINGS: Scattered large and small bowel gas is noted. No free air is seen. Increasing left-sided effusion is noted when compare with the prior chest exam. No acute bony abnormality is seen. IMPRESSION: Unremarkable abdomen. Increasing left-sided pleural effusion. Electronically Signed   By: Inez Catalina M.D.   On: 06/10/2022 09:56   DG CHEST PORT 1 VIEW  Result Date: 06/10/2022 CLINICAL DATA:  Shortness of breath EXAM: PORTABLE CHEST 1 VIEW COMPARISON:  06/09/2022 FINDINGS: Cardiac shadow is enlarged but stable. Increasing left-sided pleural effusion is noted when compare with the prior exam. Persistent and slightly increased airspace opacity in the right apex is noted. No  bony abnormality is noted. IMPRESSION: Increasing left-sided effusion compared with the prior exam. Increasing right apical airspace opacity. Electronically Signed   By:  Inez Catalina M.D.   On: 06/10/2022 09:55   DG CHEST PORT 1 VIEW  Result Date: 06/09/2022 CLINICAL DATA:  Post left-sided thoracentesis. EXAM: PORTABLE CHEST 1 VIEW COMPARISON:  CT abdomen and pelvis-06/08/2022; chest radiograph-06/01/2022 FINDINGS: Moderate-sized potentially partially loculated residual left-sided pleural effusion post thoracentesis. No pneumothorax. There is obscuration of the left heart border secondary to left-sided effusion associated left mid and lower lung heterogeneous/consolidative opacities. Trace right-sided pleural effusion is not excluded. Pulmonary vasculature is indistinct with cephalization of flow. No acute osseous abnormalities. IMPRESSION: 1. Moderate-sized potentially partially loculated residual left-sided effusion post thoracentesis. No pneumothorax. 2. Left mid and lower lung heterogeneous/consolidative opacities, potentially atelectasis though worrisome for multifocal infection. 3. Suspected trace right-sided effusion with suspected mild pulmonary edema. Electronically Signed   By: Sandi Mariscal M.D.   On: 06/09/2022 11:13   US THORACENTESIS ASP PLEURAL SPACE W/IMG GUIDE  Result Date: 06/09/2022 INDICATION: Left pleural effusion, shortness of breath EXAM: ULTRASOUND GUIDED LEFT THORACENTESIS MEDICATIONS: None. COMPLICATIONS: None immediate. PROCEDURE: An ultrasound guided thoracentesis was thoroughly discussed with the patient and questions answered. The benefits, risks, alternatives and complications were also discussed. The patient understands and wishes to proceed with the procedure. Written consent was obtained. Ultrasound was performed to localize and mark an adequate pocket of fluid in the left chest. The area was then prepped and draped in the normal sterile fashion. 1% Lidocaine was used for local anesthesia. Under ultrasound guidance a 6 Fr Safe-T-Centesis catheter was introduced. Thoracentesis was performed. The catheter was removed and a dressing applied.  FINDINGS: A total of approximately 500 cc of pleural fluid was removed. Samples were sent to the laboratory as requested by the clinical team. IMPRESSION: Successful ultrasound guided left thoracentesis yielding 500 cc of pleural fluid. Performed and dictated by Pasty Spillers, PA-C Electronically Signed   By: Sandi Mariscal M.D.   On: 06/09/2022 11:09   CT ABDOMEN PELVIS W CONTRAST  Result Date: 06/08/2022 CLINICAL DATA:  Nausea, vomiting, epigastric pain, splenic vein thrombosis, pancreatic necrosis, leukocytosis EXAM: CT ABDOMEN AND PELVIS WITH CONTRAST TECHNIQUE: Multidetector CT imaging of the abdomen and pelvis was performed using the standard protocol following bolus administration of intravenous contrast. RADIATION DOSE REDUCTION: This exam was performed according to the departmental dose-optimization program which includes automated exposure control, adjustment of the mA and/or kV according to patient size and/or use of iterative reconstruction technique. CONTRAST:  138m OMNIPAQUE IOHEXOL 300 MG/ML  SOLN COMPARISON:  05/31/2022 FINDINGS: Lower chest: New moderate left pleural effusion and consolidation/atelectasis in the visualized dependent aspect of the left lung. Trace right pleural fluid. No pericardial effusion. Hepatobiliary: Gallbladder is physiologically distended without calcified stones. No focal liver lesion or biliary ductal dilatation. Pancreas: Perhaps marginal improvement in the peripancreatic inflammatory change. Peripancreatic pseudocysts with dominant cluster near the body/tail junction are slightly better marginated, similar in size. There is a new well-marginated pseudocyst abutting the lateral wall of gastric body, 4.4 x 2.7 cm. Pancreatic parenchymal enhancement stable. Spleen: Interval development of large mixed-attenuation perisplenic or subcapsular hematoma , margins difficult to differentiate from the spleen itself, the overall process measuring 14 x 10.2 x 9.9 cm (740cc) ,  compared to original spleen dimensions 11.8 x 4.1 x7.4 cm (187cc) on prior study. No convincing active extravasation on the single phase of the scan. Adrenals/Urinary Tract: No adrenal mass. Symmetric renal enhancement without  focal lesion or hydronephrosis. Urinary bladder physiologically distended. Stomach/Bowel: Stomach is incompletely distended, with mucosal enhancement. The small bowel is decompressed. Normal appendix. Colon is incompletely distended, unremarkable. Vascular/Lymphatic: No significant arterial pathology evident. Portal vein patent. Incomplete opacification of the splenic vein consistent with previously described splenic vein thrombosis. SMV is patent. Bilateral renal veins patent. No abdominal or pelvic adenopathy localized. Reproductive: Uterus and bilateral adnexa are unremarkable. Other: Small volume pelvic ascites. Infiltrative changes in the right upper quadrant and perisplenic mesenteric and retroperitoneal fat may be related to pancreatitis or the splenic hemorrhage. Musculoskeletal: No acute or significant osseous findings. IMPRESSION: 1. Interval development of 500+mL mixed-attenuation perisplenic/subcapsular splenic hematoma, without evident active extravasation. 2. New 4.4 cm pseudocyst abutting the lateral wall of the gastric body. 3. New moderate left pleural effusion and consolidation/atelectasis in the visualized dependent aspect of the left lung. 4. Some improvement in peripancreatic inflammatory change with stable peripancreatic pseudocysts. 5. Small volume pelvic ascites. 6. Incomplete opacification of the splenic vein consistent with previously described splenic vein thrombosis. Electronically Signed   By: Lucrezia Europe M.D.   On: 06/08/2022 13:32    Lab Results: Recent Labs    06/08/22 0438 06/08/22 1354 06/09/22 2146 06/10/22 0539 06/10/22 0911  WBC 22.6*  --   --  21.1*  --   HGB 13.3   < > 10.1* 9.8* 10.4*  HCT 38.6   < > 28.4* 27.3* 29.5*  PLT 503*  --   --  482*   --    < > = values in this interval not displayed.   BMET Recent Labs    06/08/22 0438 06/09/22 0558 06/10/22 0539  NA 133* 134* 134*  K 4.1 4.4 4.1  CL 95* 97* 97*  CO2 '23 27 27  '$ GLUCOSE 196* 133* 136*  BUN '8 10 13  '$ CREATININE 0.48 0.47 0.34*  CALCIUM 8.9 8.4* 8.5*   LFT Recent Labs    06/10/22 0539  PROT 6.4*  ALBUMIN 2.0*  AST 12*  ALT 8  ALKPHOS 100  BILITOT 0.7   PT/INR No results for input(s): "LABPROT", "INR" in the last 72 hours.   Scheduled inpatient medications:   sodium chloride   Intravenous Once   Chlorhexidine Gluconate Cloth  6 each Topical Daily   folic acid  1 mg Oral Daily    HYDROmorphone (DILAUDID) injection  1 mg Intravenous Q4H   influenza vac split quadrivalent PF  0.5 mL Intramuscular Tomorrow-1000   lidocaine (PF)  6 mL Intradermal Once   potassium chloride SA  60 mEq Oral Daily   sodium chloride flush  10 mL Intrapleural Q8H   Continuous inpatient infusions:  PRN inpatient medications: acetaminophen **OR** acetaminophen, HYDROmorphone (DILAUDID) injection, meclizine, mouth rinse, prochlorperazine  Vital signs in last 24 hours: Temp:  [98.1 F (36.7 C)-99.1 F (37.3 C)] 98.1 F (36.7 C) (11/05 0407) Pulse Rate:  [133] (P) 133 (11/05 1000) Resp:  [16-20] 20 (11/05 0428) BP: (137-160)/(87-104) 143/100 (11/05 0458) SpO2:  [89 %-93 %] (P) 93 % (11/05 1000) FiO2 (%):  [21 %-100 %] (P) 100 % (11/05 1000) Last BM Date : 06/04/22  Intake/Output Summary (Last 24 hours) at 06/10/2022 1114 Last data filed at 06/10/2022 0315 Gross per 24 hour  Intake 1513.92 ml  Output 500 ml  Net 1013.92 ml    Intake/Output from previous day: 11/04 0701 - 11/05 0700 In: 1513.9 [I.V.:1352.1; IV Piggyback:161.8] Out: 500 [Urine:500] Intake/Output this shift: No intake/output data recorded.   Physical Exam:  General:  Alert female in NAD Heart:  sinus tachycardia. No lower extremity edema Pulmonary: currently without increased work of breathing on  supplemental 02. Abdomen: Soft, mildly distended, mild right mid abdominal tenderness. A few bowel sounds.  Neurologic: Alert and oriented Psych: Pleasant. Cooperative.    Principal Problem:   Acute pancreatitis Active Problems:   Essential hypertension   Diarrhea   Splenic vein thrombosis   Liddle's syndrome   Spleen hematoma   Acute hypoxic respiratory failure (HCC)   Exudative pleural effusion   HCAP (healthcare-associated pneumonia)     LOS: 9 days   Jillian Shepherd ,NP 06/10/2022, 11:14 AM

## 2022-06-10 NOTE — Procedures (Signed)
Insertion of Chest Tube Procedure Note  Nerissa Constantin  [id number]  05/26/1986  Date:06/10/22  Time:11:00 AM    Provider Performing: Clementeen Graham   Procedure: Pleural Catheter Insertion w/ Imaging Guidance [phone])  Indication(s) Effusion  Consent Risks of the procedure as well as the alternatives and risks of each were explained to the patient and/or caregiver.  Consent for the procedure was obtained and is signed in the bedside chart Emergent verbal consent obtained at bedside by pt and also discussed w/ husband  Anesthesia Topical only with 1% lidocaine    Time Out Verified patient identification, verified procedure, site/side was marked, verified correct patient position, special equipment/implants available, medications/allergies/relevant history reviewed, required imaging and test results available.   Sterile Technique Maximal sterile technique including full sterile barrier drape, hand hygiene, sterile gown, sterile gloves, mask, hair covering, sterile ultrasound probe cover (if used).   Procedure Description Ultrasound used to identify appropriate pleural anatomy for placement and overlying skin marked. Area of placement cleaned and draped in sterile fashion.  A 14 French pigtail pleural catheter was placed into the left pleural space using Seldinger technique. Appropriate return of fluid was obtained.  The tube was connected to atrium and placed on -20 cm H2O wall suction.   Complications/Tolerance None; patient tolerated the procedure well. Chest X-ray is ordered to verify placement.   EBL Minimal  Specimen(s) none  Erick Colace ACNP-BC [place] Pager # [phone] OR # [phone] if no answer

## 2022-06-10 NOTE — Progress Notes (Signed)
RT was called to patient room this AM due to patient having a drop in sats to the low 80s.  Upon arrival, patient had a NRB working as blowby with sats only reading in the low 80s.  Patient wearing end-tidal CO2 cannula for PCA pump.  Patient states that she has claustrophobia when it comes to wearing the mask.  Patients sats improve to 98% when patient keeps NRB close to her face.  MD paged and ordered dose of Lasix, chest xray, and ABG.  Rapid response RN called as well.  Attending MD consulted critical care team and decision was made to place patient on heated high flow.  Patient was placed on 25L and 100%.  Will continue to monitor.

## 2022-06-10 NOTE — Progress Notes (Signed)
Assessment & Plan: HD#2 (following transfer from [facility]) Acute alcoholic pancreatitis Splenic vein thrombosis Splenic hematoma Pseudocyst formation Left pleural effusion   NPO, swabs to mouth for comfort IVF Serial Hgb determinations - remaining ~stable at 9.8 this AM Thoracentesis with 500cc output - CXR this AM not significantly improved  Patient with increasing abdominal pain, increasing WOB.  WBC 21K this AM.  Increasing oxygen requirements, now on NRB. May require intubation by CCM.  Discussed with hospitalist at bedside.  Agree with CCM consultation and transfer to ICU.  Will follow closely with you.  Repeat CT abd if hemoglobin falls or abdominal pain persists, when medically stable for study.        Jillian Gemma, MD [facility] [place] practice Office: [phone]        Chief Complaint: Alcoholic pancreatitis with complications  Subjective: Patient in bed, anxious.  Complains of increased abdominal pain on right side.  Objective: Vital signs in last 24 hours: Temp:  [98.1 F (36.7 C)-99.1 F (37.3 C)] 98.1 F (36.7 C) (11/05 0407) Resp:  [16-20] 20 (11/05 0428) BP: (137-160)/(87-108) 143/100 (11/05 0458) SpO2:  [89 %-92 %] 89 % (11/05 0458) FiO2 (%):  [21 %] 21 % (11/05 0428) Last BM Date : 06/04/22  Intake/Output from previous day: 11/04 0701 - 11/05 0700 In: 1513.9 [I.V.:1352.1; IV Piggyback:161.8] Out: 500 [Urine:500] Intake/Output this shift: No intake/output data recorded.  Physical Exam: HEENT - sclerae clear, mucous membranes moist Neck - soft Chest - improved breath sounds on left Cor - tachycardic Abdomen - moderate distension; diffuse mild tenderness; areas of stable ecchymosis  Lab Results:  Recent Labs    06/08/22 0438 06/08/22 1354 06/09/22 2146 06/10/22 0539  WBC 22.6*  --   --  21.1*  HGB 13.3   < > 10.1* 9.8*  HCT 38.6   < > 28.4* 27.3*  PLT 503*  --   --  482*   < > = values in this interval not  displayed.   BMET Recent Labs    06/09/22 0558 06/10/22 0539  NA 134* 134*  K 4.4 4.1  CL 97* 97*  CO2 27 27  GLUCOSE 133* 136*  BUN 10 13  CREATININE 0.47 0.34*  CALCIUM 8.4* 8.5*   PT/INR No results for input(s): "LABPROT", "INR" in the last 72 hours. Comprehensive Metabolic Panel:    Component Value Date/Time   NA 134 (L) 06/10/2022 0539   NA 134 (L) 06/09/2022 0558   NA 140 07/27/2020 1555   K 4.1 06/10/2022 0539   K 4.4 06/09/2022 0558   CL 97 (L) 06/10/2022 0539   CL 97 (L) 06/09/2022 0558   CO2 27 06/10/2022 0539   CO2 27 06/09/2022 0558   BUN 13 06/10/2022 0539   BUN 10 06/09/2022 0558   BUN 7 07/27/2020 1555   CREATININE 0.34 (L) 06/10/2022 0539   CREATININE 0.47 06/09/2022 0558   CREATININE 0.40 (L) 05/17/2022 1230   CREATININE 0.44 (L) 05/02/2022 0903   GLUCOSE 136 (H) 06/10/2022 0539   GLUCOSE 133 (H) 06/09/2022 0558   CALCIUM 8.5 (L) 06/10/2022 0539   CALCIUM 8.4 (L) 06/09/2022 0558   AST 12 (L) 06/10/2022 0539   AST 14 (L) 06/09/2022 0558   ALT 8 06/10/2022 0539   ALT 8 06/09/2022 0558   ALKPHOS 100 06/10/2022 0539   ALKPHOS 115 06/09/2022 0558   BILITOT 0.7 06/10/2022 0539   BILITOT 0.7 06/09/2022 0558   BILITOT 0.9 07/27/2020 1555  PROT 6.4 (L) 06/10/2022 0539   PROT 6.3 (L) 06/09/2022 0558   PROT 7.0 07/27/2020 1555   ALBUMIN 2.0 (L) 06/10/2022 0539   ALBUMIN 2.0 (L) 06/09/2022 0558   ALBUMIN 4.1 07/27/2020 1555    Studies/Results: DG CHEST PORT 1 VIEW  Result Date: 06/09/2022 CLINICAL DATA:  Post left-sided thoracentesis. EXAM: PORTABLE CHEST 1 VIEW COMPARISON:  CT abdomen and pelvis-06/08/2022; chest radiograph-06/01/2022 FINDINGS: Moderate-sized potentially partially loculated residual left-sided pleural effusion post thoracentesis. No pneumothorax. There is obscuration of the left heart border secondary to left-sided effusion associated left mid and lower lung heterogeneous/consolidative opacities. Trace right-sided pleural effusion  is not excluded. Pulmonary vasculature is indistinct with cephalization of flow. No acute osseous abnormalities. IMPRESSION: 1. Moderate-sized potentially partially loculated residual left-sided effusion post thoracentesis. No pneumothorax. 2. Left mid and lower lung heterogeneous/consolidative opacities, potentially atelectasis though worrisome for multifocal infection. 3. Suspected trace right-sided effusion with suspected mild pulmonary edema. Electronically Signed   By: Sandi Mariscal M.D.   On: 06/09/2022 11:13   US THORACENTESIS ASP PLEURAL SPACE W/IMG GUIDE  Result Date: 06/09/2022 INDICATION: Left pleural effusion, shortness of breath EXAM: ULTRASOUND GUIDED LEFT THORACENTESIS MEDICATIONS: None. COMPLICATIONS: None immediate. PROCEDURE: An ultrasound guided thoracentesis was thoroughly discussed with the patient and questions answered. The benefits, risks, alternatives and complications were also discussed. The patient understands and wishes to proceed with the procedure. Written consent was obtained. Ultrasound was performed to localize and mark an adequate pocket of fluid in the left chest. The area was then prepped and draped in the normal sterile fashion. 1% Lidocaine was used for local anesthesia. Under ultrasound guidance a 6 Fr Safe-T-Centesis catheter was introduced. Thoracentesis was performed. The catheter was removed and a dressing applied. FINDINGS: A total of approximately 500 cc of pleural fluid was removed. Samples were sent to the laboratory as requested by the clinical team. IMPRESSION: Successful ultrasound guided left thoracentesis yielding 500 cc of pleural fluid. Performed and dictated by Pasty Spillers, PA-C Electronically Signed   By: Sandi Mariscal M.D.   On: 06/09/2022 11:09   CT ABDOMEN PELVIS W CONTRAST  Result Date: 06/08/2022 CLINICAL DATA:  Nausea, vomiting, epigastric pain, splenic vein thrombosis, pancreatic necrosis, leukocytosis EXAM: CT ABDOMEN AND PELVIS WITH CONTRAST  TECHNIQUE: Multidetector CT imaging of the abdomen and pelvis was performed using the standard protocol following bolus administration of intravenous contrast. RADIATION DOSE REDUCTION: This exam was performed according to the departmental dose-optimization program which includes automated exposure control, adjustment of the mA and/or kV according to patient size and/or use of iterative reconstruction technique. CONTRAST:  144m OMNIPAQUE IOHEXOL 300 MG/ML  SOLN COMPARISON:  05/31/2022 FINDINGS: Lower chest: New moderate left pleural effusion and consolidation/atelectasis in the visualized dependent aspect of the left lung. Trace right pleural fluid. No pericardial effusion. Hepatobiliary: Gallbladder is physiologically distended without calcified stones. No focal liver lesion or biliary ductal dilatation. Pancreas: Perhaps marginal improvement in the peripancreatic inflammatory change. Peripancreatic pseudocysts with dominant cluster near the body/tail junction are slightly better marginated, similar in size. There is a new well-marginated pseudocyst abutting the lateral wall of gastric body, 4.4 x 2.7 cm. Pancreatic parenchymal enhancement stable. Spleen: Interval development of large mixed-attenuation perisplenic or subcapsular hematoma , margins difficult to differentiate from the spleen itself, the overall process measuring 14 x 10.2 x 9.9 cm (740cc) , compared to original spleen dimensions 11.8 x 4.1 x7.4 cm (187cc) on prior study. No convincing active extravasation on the single phase of the scan. Adrenals/Urinary Tract:  No adrenal mass. Symmetric renal enhancement without focal lesion or hydronephrosis. Urinary bladder physiologically distended. Stomach/Bowel: Stomach is incompletely distended, with mucosal enhancement. The small bowel is decompressed. Normal appendix. Colon is incompletely distended, unremarkable. Vascular/Lymphatic: No significant arterial pathology evident. Portal vein patent. Incomplete  opacification of the splenic vein consistent with previously described splenic vein thrombosis. SMV is patent. Bilateral renal veins patent. No abdominal or pelvic adenopathy localized. Reproductive: Uterus and bilateral adnexa are unremarkable. Other: Small volume pelvic ascites. Infiltrative changes in the right upper quadrant and perisplenic mesenteric and retroperitoneal fat may be related to pancreatitis or the splenic hemorrhage. Musculoskeletal: No acute or significant osseous findings. IMPRESSION: 1. Interval development of 500+mL mixed-attenuation perisplenic/subcapsular splenic hematoma, without evident active extravasation. 2. New 4.4 cm pseudocyst abutting the lateral wall of the gastric body. 3. New moderate left pleural effusion and consolidation/atelectasis in the visualized dependent aspect of the left lung. 4. Some improvement in peripancreatic inflammatory change with stable peripancreatic pseudocysts. 5. Small volume pelvic ascites. 6. Incomplete opacification of the splenic vein consistent with previously described splenic vein thrombosis. Electronically Signed   By: Lucrezia Europe M.D.   On: 06/08/2022 13:32      Jillian Shepherd 06/10/2022  Patient ID: Jillian Shepherd, Jillian Shepherd   DOB: January 12, 1986, 36 y.o.   MRN: [id number]

## 2022-06-10 NOTE — Progress Notes (Signed)
PROGRESS NOTE    Wendy Hoback  NLG:[id number] DOB: January 12, 1986 DOA: 05/31/2022 PCP: [name], MD   Brief Narrative:  The patient is a obese 36 year old Caucasian female with a past medical history significant for but not limited to previous alcohol use was admitted earlier to the hospital this month for acute alcoholic pancreatitis.  She was then discharged but then subsequently readmitted to the hospital on 05/31/2022 with worsening abdominal pain.  CT showed persistent pancreatitis and splenic vein thrombosis.  She was started on IV fluid hydration and pain management and anticoagulation was given.  GI is following.  This splenic vein thrombosis was likely a sequelae of pancreatitis.  She does noted to have some clinical deterioration and worsening leukocytosis.  Given this GI recommended repeating CT imaging and she was noted to have a splenic hematoma as well as a new pancreatic pseudocyst.  Procalcitonin was negative but patient continued to have worsening leukocytosis and she was given Kcentra for the reversal of Eliquis and repeat hemoglobin/hematocrit showed stability and vital signs being stable but she is continuing to be tachycardic.  General surgery was consulted and they recommended transferring to [facility] for further evaluation.  She is currently getting IV fluid hydration still in general surgery as well as GI following this hospitalization at [facility] now.  Currently she remains n.p.o. at surgery's recommendation.   The patient acutely decompensated overnight and had to be placed on a nonrebreather and heated high flow nasal cannula.  Repeat chest x-ray was done and showed more fluid so pulmonary was consulted and they placed a chest tube with improvement of her symptoms.  She was also complained of some abdominal discomfort that is worsened.  She is at high risk for further decompensation.  GI recommended evaluation and discussion with IR and IR was consulted and they feel  that she is not a candidate for splenic vein thrombectomy but they could consider splenic embolization if she has recurrent bleeding is not a surgical candidate for splenectomy given the various potential outcomes of embolization as patient.  Critical care feels that she had a very large exudative pleural effusion and could have a parapneumonic effusion from a pneumonia or more likely exudative from her pancreatitis so they have initiated antibiotics for possible HAP.  Assessment and Plan:  Acute on chronic pancreatitis-with worsening abdominal pain in the setting of splenic hematoma SIRS in the setting of pancreatitis -Patient reports that she has abstained from alcohol since her last discharge -Worsening pain may be related to splenic vein thrombosis which is likely a sequelae of pancreatitis -Appreciate ongoingGI input with plans to repeat CT abdomen and consider initiation of antibiotics pending results. -Repeat CT scan done and showed "Interval development of 500+mL mixed-attenuation perisplenic/subcapsular splenic hematoma, without evident active extravasation. New 4.4 cm pseudocyst abutting the lateral wall of the gastric body. New moderate left pleural effusion and consolidation/atelectasis in the visualized dependent aspect of the left lung. Some improvement in peripancreatic inflammatory change with stable peripancreatic pseudocysts. Small volume pelvic ascites. Incomplete opacification of the splenic vein consistent with previously described splenic vein thrombosis." -Noted to have some clinical deterioration with worsening leukocytosis -Hemoglobin/hematocrit went from 12.6/35.8 and is now trended down to 10.4/29.2 yesterday and is now 9.8/27.3 currently relatively stable -Patient will be n.p.o. again and start aggressive IV fluid hydration as well as pain management -CRP was elevated at 24.6 -Surgery recommending serial hemoglobin and pain control but they have no plans for acute  operative intervention -GI will  commending to continue maintenance fluids however this was stopped when the volume overload.  IR has been consulted as below.  GI recommending continuing supportive care and pain management and will keep her n.p.o. and may need some postpyloric tube feeding soon -Pain control via PCA pump   Acute respiratory failure with hypoxia in the setting of large exudative pleural effusion and volume overload -In the setting of above and she is requiring at least 4 L of nasal cannula to maintain O2 saturation greater 90% -Repeat CT scan was done on 11 3 which showed a moderate left-sided pleural effusion consolidation atelectasis as well as small volume pelvic ascites -Fluid hydration has been reduced yesterday -Fluid sent for analysis and showed total nucleated cell count was 3053 with cloudy appearance and neutrophil count being 62; Gram stain and fungus culture pending but currently showed no organisms -SpO2: 96 % O2 Flow Rate (L/min): 25 L/min FiO2 (%): 100 % -ABG    Component Value Date/Time   PHART 7.45 06/10/2022 0830   PCO2ART 49 (H) 06/10/2022 0830   PO2ART 139 (H) 06/10/2022 0830   HCO3 34.1 (H) 06/10/2022 0830   O2SAT 100 06/10/2022 0830    -The fluids have been stopped and she was given diuresis of IV Lasix 40 mils x1 -We will continue monitor patient's respiratory status carefully and will continue supplemental oxygen via nasal cannula wean O2 as tolerated -Continuous pulse oximetry maintain O2 saturation greater 90% -Prior to discharge she will need an ambulatory home O2 screen and we will repeat a chest x-ray in the morning  -Repeat chest x-ray yesterday after thoracentesis showed "Moderate-sized potentially partially loculated residual left-sided effusion post thoracentesis. No pneumothorax. Left mid and lower lung heterogeneous/consolidative opacities, potentially atelectasis though worrisome for multifocal infection.  Suspected trace right-sided  effusion with suspected mild pulmonary edema." -Because of her worsening respiratory status pulmonary was consulted and they placed a chest tube and initiated antibiotics -Pulmonary recommending repeating chest x-ray in a.m. and if she has a persistent effusion she will likely need TPN dornase -They are giving her Toradol for pain control continue Dilaudid.  They are also recommending PPI while she is on Toradol -Procalcitonin level is being checked and trending down but she is still being placed on antibiotics as above   Hypomagnesemia -Patient's magnesium level is 1.4 and was repleted yesterday and is improved to -Continue monitor and replete as necessary and repeat magnesium in the a.m.   Hypoalbuminemia -Patient's albumin level is now 2.0 again and -Continue to monitor and trend and repeat CMP in a.m.   Acute splenic vein thrombosis -Eliquis has been reversed with Kcentra given the splenic hematoma -Found on CT scan to have a subcapsular hematoma and was transferred to [facility] -Surgery following and recommending n.p.o.  Patient did have some increasing abdominal pain and increased work of breathing.  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   Weight as of this  encounter: 81.7 kg.  -Weight Loss and Dietary Counseling given  DVT prophylaxis: Place and maintain sequential compression device Start: 06/08/22 1450    Code Status: Full Code Family Communication: No family currently at bedside  Disposition Plan:  Level of care: Progressive Status is: Inpatient Remains inpatient appropriate because: Needs further clinical improvement and clearance by all the specialists   Consultants:  General Surgery Gastroenterology Pulmonary IR  Procedures:  As delineated as above  Antimicrobials:  Anti-infectives (From admission, onward)    Start     Dose/Rate Route Frequency Ordered Stop   06/11/22 0000  vancomycin (VANCOREADY) IVPB 750 mg/150 mL        750 mg 150 mL/hr over 60 Minutes Intravenous Every 12 hours 06/10/22 1131     06/10/22 1400  ceFEPIme (MAXIPIME) 2 g in sodium chloride 0.9 % 100 mL IVPB        2 g 200 mL/hr over 30 Minutes Intravenous Every 8 hours 06/10/22 1131     06/10/22 1230  vancomycin (VANCOREADY) IVPB 1500 mg/300 mL        1,500 mg 150 mL/hr over 120 Minutes Intravenous  Once 06/10/22 1131 06/10/22 1503       Subjective: Seen and examined at bedside and she was complaining of more abdominal discomfort and more SOB. Did not feel well today.  States that she did not rest at all.  Had no nausea or vomiting but states that abdomen pain was worsening and had some pain on the right side now.  Denies any lightheadedness or dizziness.  No other concerns or plaints at this time.  Objective: Vitals:   06/10/22 0458 06/10/22 1000 06/10/22 1300 06/10/22 2002  BP: (!) 143/100  (!) 144/106 (!) 135/96  Pulse:  (!) 133 (!) 123 (!) 120  Resp:   20 19  Temp:   97.9 F (36.6 C) 98.5 F (36.9 C)  TempSrc:   Oral Oral  SpO2: (!) 89% 93% 98% 96%  Weight:      Height:        Intake/Output Summary (Last 24 hours) at 06/10/2022 2015 Last data filed at 06/10/2022 1800 Gross per 24 hour  Intake 568.5 ml  Output 500 ml  Net 68.5 ml    Filed Weights   05/31/22 0824 06/08/22 1454  Weight: 83.6 kg 81.7 kg   Examination: Physical Exam:  Constitutional: WN/WD obese young Caucasian female who is ill-appearing and dyspneic Respiratory: Diminished to auscultation bilaterally with coarse breath sounds worse on the left compared to right has some crackles noted and has increased respiratory effort and rate and is on a high flow nasal cannula with a nonrebreather,  Cardiovascular: Tachycardic rate but regular rhythm, no murmurs / rubs / gallops. S1 and S2 auscultated. No appreciable extremity edema.  Abdomen: Soft, tender to palpate, distended secondary body habitus bowel sounds positive.  GU: Deferred. Musculoskeletal: No clubbing / cyanosis of digits/nails. No joint deformity upper and lower extremities.  Skin: Has a bruise on her abdomen Neurologic: CN 2-12 grossly intact with no focal deficits. Romberg sign and cerebellar reflexes not assessed.  Psychiatric: Normal judgment and insight. Alert and oriented x 3. Normal mood and appropriate affect.   Data Reviewed: I have personally reviewed following labs and imaging studies  CBC: Recent Labs  Lab 06/05/22 0419 06/06/22 0421 06/07/22 0415 06/08/22 0438 06/08/22 1354 06/09/22 2008 06/09/22 2146 06/10/22  1941 06/10/22 0911 06/10/22 1607  WBC 7.7 8.7 11.5* 22.6*  --   --   --  21.1*  --   --   NEUTROABS  --   --   --   --   --   --   --  17.5*  --   --   HGB 12.9 12.9 14.0 13.3   < > 10.4* 10.1* 9.8* 10.4* 9.8*  HCT 38.0 37.4 40.1 38.6   < > 29.2* 28.4* 27.3* 29.5* 27.6*  MCV 107.0* 104.2* 102.6* 102.9*  --   --   --  100.0  --   --   PLT 329 359 394 503*  --   --   --  482*  --   --    < > = values in this interval not displayed.   Basic Metabolic Panel: Recent Labs  Lab 06/05/22 0419 06/08/22 0438 06/09/22 0145 06/09/22 0558 06/10/22 0539  NA 135 133*  --  134* 134*  K 4.1 4.1  --  4.4 4.1  CL 99 95*  --  97* 97*  CO2 27 23  --  27 27  GLUCOSE 86 196*   --  133* 136*  BUN <5* 8  --  10 13  CREATININE 0.46 0.48  --  0.47 0.34*  CALCIUM 8.8* 8.9  --  8.4* 8.5*  MG  --   --   --  1.4* 2.1  PHOS  --   --  4.3  --  3.3   GFR: Estimated Creatinine Clearance: 97.4 mL/min (A) (by C-G formula based on SCr of 0.34 mg/dL (L)). Liver Function Tests: Recent Labs  Lab 06/09/22 0558 06/10/22 0539  AST 14* 12*  ALT 8 8  ALKPHOS 115 100  BILITOT 0.7 0.7  PROT 6.3* 6.4*  ALBUMIN 2.0* 2.0*   Recent Labs  Lab 06/10/22 0539  LIPASE 25   No results for input(s): "AMMONIA" in the last 168 hours. Coagulation Profile: No results for input(s): "INR", "PROTIME" in the last 168 hours. Cardiac Enzymes: No results for input(s): "CKTOTAL", "CKMB", "CKMBINDEX", "TROPONINI" in the last 168 hours. BNP (last 3 results) No results for input(s): "PROBNP" in the last 8760 hours. HbA1C: No results for input(s): "HGBA1C" in the last 72 hours. CBG: No results for input(s): "GLUCAP" in the last 168 hours. Lipid Profile: No results for input(s): "CHOL", "HDL", "LDLCALC", "TRIG", "CHOLHDL", "LDLDIRECT" in the last 72 hours. Thyroid Function Tests: Recent Labs    06/10/22 0539  TSH 2.265   Anemia Panel: No results for input(s): "VITAMINB12", "FOLATE", "FERRITIN", "TIBC", "IRON", "RETICCTPCT" in the last 72 hours. Sepsis Labs: Recent Labs  Lab 06/08/22 0438 06/09/22 0155 06/09/22 0558 06/10/22 0539  PROCALCITON 0.29  --   --  0.21  LATICACIDVEN  --  1.0 0.8  --     Recent Results (from the past 240 hour(s))  MRSA Next Gen by PCR, Nasal     Status: None   Collection Time: 06/08/22  2:34 PM   Specimen: Nasal Mucosa; Nasal Swab  Result Value Ref Range Status   MRSA by PCR Next Gen NOT DETECTED NOT DETECTED Final    Comment: (NOTE) The GeneXpert MRSA Assay (FDA approved for NASAL specimens only), is one component of a comprehensive MRSA colonization surveillance program. It is not intended to diagnose MRSA infection nor to guide or monitor  treatment for MRSA infections. Test performance is not FDA approved in patients less than 42 years old. Performed at [facility]  [address] [place], [place] [address]   Culture, body fluid w Gram Stain-bottle     Status: None (Preliminary result)   Collection Time: 06/09/22 10:56 AM   Specimen: Pleura  Result Value Ref Range Status   Specimen Description PLEURAL  Final   Special Requests NONE  Final   Culture   Final    NO GROWTH < 24 HOURS Performed at [facility], [place] [address] [place], [place] [address]    Report Status PENDING  Incomplete  Gram stain     Status: None   Collection Time: 06/09/22 10:56 AM   Specimen: Pleura  Result Value Ref Range Status   Specimen Description PLEURAL  Final   Special Requests NONE  Final   Gram Stain   Final    FEW WBC PRESENT,BOTH PMN AND MONONUCLEAR NO ORGANISMS SEEN Performed at [facility], [address], [place], [place] [address]    Report Status 06/09/2022 FINAL  Final     Radiology Studies: DG Chest Port 1 View  Result Date: 06/10/2022 CLINICAL DATA:  Left chest tube placement EXAM: PORTABLE CHEST 1 VIEW COMPARISON:  Previous studies including the examination done earlier today FINDINGS: Transverse diameter of heart is increased. There is significant interval decrease in left pleural effusion after placement of left chest tube. There is a kink in the distal course of the left chest tube approximately 7 cm from the tip. There is possible trace amount of pneumothorax in the lateral aspect of left upper lung field. There is possible tiny left apical pneumothorax. Residual increased density in left lower lung fields suggest possible underlying atelectasis/pneumonia. There is increase in patchy infiltrate in right upper lung fields. IMPRESSION: There is marked interval decrease in left pleural effusion after placement of left chest tube. Residual increased density in left lower lung fields may be due to pleural effusion  and underlying atelectasis/pneumonia. A kink is noted in the distal course of left chest tube. Possible tiny pneumothorax is noted in the left apex and lateral aspect of left upper lung field. There is interval increase in patchy infiltrate in right upper lung field suggesting worsening of pneumonia. Electronically Signed   By: Elmer Picker M.D.   On: 06/10/2022 11:57   DG Abd 1 View  Result Date: 06/10/2022 CLINICAL DATA:  Abdominal pain and distension EXAM: ABDOMEN - 1 VIEW COMPARISON:  None Available. FINDINGS: Scattered large and small bowel gas is noted. No free air is seen. Increasing left-sided effusion is noted when compare with the prior chest exam. No acute bony abnormality is seen. IMPRESSION: Unremarkable abdomen. Increasing left-sided pleural effusion. Electronically Signed   By: Inez Catalina M.D.   On: 06/10/2022 09:56   DG CHEST PORT 1 VIEW  Result Date: 06/10/2022 CLINICAL DATA:  Shortness of breath EXAM: PORTABLE CHEST 1 VIEW COMPARISON:  06/09/2022 FINDINGS: Cardiac shadow is enlarged but stable. Increasing left-sided pleural effusion is noted when compare with the prior exam. Persistent and slightly increased airspace opacity in the right apex is noted. No bony abnormality is noted. IMPRESSION: Increasing left-sided effusion compared with the prior exam. Increasing right apical airspace opacity. Electronically Signed   By: Inez Catalina M.D.   On: 06/10/2022 09:55   DG CHEST PORT 1 VIEW  Result Date: 06/09/2022 CLINICAL DATA:  Post left-sided thoracentesis. EXAM: PORTABLE CHEST 1 VIEW COMPARISON:  CT abdomen and pelvis-06/08/2022; chest radiograph-06/01/2022 FINDINGS: Moderate-sized potentially partially loculated residual left-sided pleural effusion post thoracentesis. No pneumothorax. There is  obscuration of the left heart border secondary to left-sided effusion associated left mid and lower lung heterogeneous/consolidative opacities. Trace right-sided pleural effusion is not  excluded. Pulmonary vasculature is indistinct with cephalization of flow. No acute osseous abnormalities. IMPRESSION: 1. Moderate-sized potentially partially loculated residual left-sided effusion post thoracentesis. No pneumothorax. 2. Left mid and lower lung heterogeneous/consolidative opacities, potentially atelectasis though worrisome for multifocal infection. 3. Suspected trace right-sided effusion with suspected mild pulmonary edema. Electronically Signed   By: Sandi Mariscal M.D.   On: 06/09/2022 11:13   US THORACENTESIS ASP PLEURAL SPACE W/IMG GUIDE  Result Date: 06/09/2022 INDICATION: Left pleural effusion, shortness of breath EXAM: ULTRASOUND GUIDED LEFT THORACENTESIS MEDICATIONS: None. COMPLICATIONS: None immediate. PROCEDURE: An ultrasound guided thoracentesis was thoroughly discussed with the patient and questions answered. The benefits, risks, alternatives and complications were also discussed. The patient understands and wishes to proceed with the procedure. Written consent was obtained. Ultrasound was performed to localize and mark an adequate pocket of fluid in the left chest. The area was then prepped and draped in the normal sterile fashion. 1% Lidocaine was used for local anesthesia. Under ultrasound guidance a 6 Fr Safe-T-Centesis catheter was introduced. Thoracentesis was performed. The catheter was removed and a dressing applied. FINDINGS: A total of approximately 500 cc of pleural fluid was removed. Samples were sent to the laboratory as requested by the clinical team. IMPRESSION: Successful ultrasound guided left thoracentesis yielding 500 cc of pleural fluid. Performed and dictated by Pasty Spillers, PA-C Electronically Signed   By: Sandi Mariscal M.D.   On: 06/09/2022 11:09    Scheduled Meds:  sodium chloride   Intravenous Once   Chlorhexidine Gluconate Cloth  6 each Topical Daily   folic acid  1 mg Oral Daily    HYDROmorphone (DILAUDID) injection  1 mg Intravenous Q4H   influenza  vac split quadrivalent PF  0.5 mL Intramuscular Tomorrow-1000   ketorolac  30 mg Intravenous Q8H   lidocaine (PF)  6 mL Intradermal Once   pantoprazole (PROTONIX) IV  40 mg Intravenous Q24H   potassium chloride SA  60 mEq Oral Daily   sodium chloride flush  10 mL Intrapleural Q8H   Continuous Infusions:  ceFEPime (MAXIPIME) IV 2 g (06/10/22 1540)   [START ON 06/11/2022] vancomycin      LOS: 9 days   Raiford Noble, DO [facility] Available via Epic secure chat 7am-7pm After these hours, please refer to coverage provider listed on [email or web address] 06/10/2022, 8:15 PM

## 2022-06-10 NOTE — Plan of Care (Signed)

## 2022-06-10 NOTE — Progress Notes (Signed)
   06/10/22 2002  Assess: MEWS Score  Temp 98.5 F (36.9 C)  BP (!) 135/96  MAP (mmHg) 109  Pulse Rate (!) 120  Resp 19  SpO2 96 %  Assess: MEWS Score  MEWS Temp 0  MEWS Systolic 0  MEWS Pulse 2  MEWS RR 0  MEWS LOC 0  MEWS Score 2  MEWS Score Color Yellow  Assess: if the MEWS score is Yellow or Red  Were vital signs taken at a resting state? Yes  Focused Assessment No change from prior assessment  Does the patient meet 2 or more of the SIRS criteria? No  MEWS guidelines implemented *See Row Information* No, previously yellow, continue vital signs every 4 hours  Assess: SIRS CRITERIA  SIRS Temperature  0  SIRS Pulse 1  SIRS Respirations  0  SIRS WBC 1  SIRS Score Sum  2

## 2022-06-10 NOTE — Progress Notes (Signed)
Pt c/o new onset of right sided abdominal pain 10/10, sharp in nature, it prevents pt from taking deep breaths. SpO2 86% on 6 L nasal cannula, pt placed on non rebreather, SpO2 98%.  Radiology called and asked for cxr scheduled for this morning. Per radiology tech they will be here around 0730. Alfredia Ferguson, MD notified.

## 2022-06-10 NOTE — Progress Notes (Signed)
Pharmacy Antibiotic Note  Jillian Shepherd is a 36 y.o. female admitted on 05/31/2022 with pneumonia.  Pharmacy has been consulted for cefepime and vancomycin dosing. Patient has been having progressively worse respiratory failure requiring escalation of oxygen needs. CT on 11/3 showed moderate left-sided pleural effusion fairly significant left-sided atelectasis and small amount of abdominal ascites. Underwent thoracentesis on 11/4 and left chest tube placed on 11/5, HFNC due to increasing O2 needs.   WBC elevated at 21.1, Scr 0.34 (bl~0.4), and afebrile.  Plan: Vancomycin 1500 mg IV x1 dose Vancomycin 750 mg IV every 12 hours. (IBW, Vd 0.5, eAUC 520) Cefepime 2g IV q8 hours Monitor for signs/symptoms of clinical improvement, fever curve, WBC trend, and culture data F/u LOT and de-escalation of antibiotics  Height: '5\' 3"'$  (160 cm) Weight: 81.7 kg (180 lb 1.9 oz) IBW/kg (Calculated) : 52.4  Temp (24hrs), Avg:98.6 F (37 C), Min:98.1 F (36.7 C), Max:99.1 F (37.3 C)  Recent Labs  Lab 06/05/22 0419 06/06/22 0421 06/07/22 0415 06/08/22 0438 06/09/22 0155 06/09/22 0558 06/10/22 0539  WBC 7.7 8.7 11.5* 22.6*  --   --  21.1*  CREATININE 0.46  --   --  0.48  --  0.47 0.34*  LATICACIDVEN  --   --   --   --  1.0 0.8  --     Estimated Creatinine Clearance: 97.4 mL/min (A) (by C-G formula based on SCr of 0.34 mg/dL (L)).    No Known Allergies  Antimicrobials this admission: Cefepime 11/5 >>  Vanc 11/5 >>   Microbiology results: 11/4: Pleural Cx: NG<24h 11/3 MRSA PCR: Not detected  Thank you for allowing pharmacy to be a part of this patient's care.  Sandford Craze, PharmD. [facility] Acute Care PGY-1  06/10/2022 11:27 AM

## 2022-06-10 NOTE — Progress Notes (Signed)
Magnesium sulfate given earlier as ordered, pt HR in 120s. Now pt HR back in 130s, BP 147/100, SpO2 90% on 5L O2, RR 20.  Lung sounds diminished, no change from prior assessment, pt in no distress. [place] Crane, MD notified.  New order for metoprolol placed.

## 2022-06-11 ENCOUNTER — Inpatient Hospital Stay (HOSPITAL_COMMUNITY): Payer: 59

## 2022-06-11 DIAGNOSIS — J90 Pleural effusion, not elsewhere classified: Secondary | ICD-10-CM | POA: Diagnosis not present

## 2022-06-11 DIAGNOSIS — I82890 Acute embolism and thrombosis of other specified veins: Secondary | ICD-10-CM | POA: Diagnosis not present

## 2022-06-11 DIAGNOSIS — S36029S Unspecified contusion of spleen, sequela: Secondary | ICD-10-CM | POA: Diagnosis not present

## 2022-06-11 DIAGNOSIS — R188 Other ascites: Secondary | ICD-10-CM | POA: Diagnosis not present

## 2022-06-11 DIAGNOSIS — K863 Pseudocyst of pancreas: Secondary | ICD-10-CM

## 2022-06-11 DIAGNOSIS — K852 Alcohol induced acute pancreatitis without necrosis or infection: Secondary | ICD-10-CM | POA: Diagnosis not present

## 2022-06-11 LAB — HEMOGLOBIN AND HEMATOCRIT, BLOOD
HCT: 25.8 % — ABNORMAL LOW (ref 36.0–46.0)
HCT: 26.2 % — ABNORMAL LOW (ref 36.0–46.0)
HCT: 24.9 % — ABNORMAL LOW (ref 36.0–46.0)
Hemoglobin: 9.2 g/dL — ABNORMAL LOW (ref 12.0–15.0)
Hemoglobin: 8.8 g/dL — ABNORMAL LOW (ref 12.0–15.0)
Hemoglobin: 8.9 g/dL — ABNORMAL LOW (ref 12.0–15.0)

## 2022-06-11 LAB — CBC WITH DIFFERENTIAL/PLATELET
Abs Immature Granulocytes: 0.1 10*3/uL — ABNORMAL HIGH (ref 0.00–0.07)
Basophils Absolute: 0.1 10*3/uL (ref 0.0–0.1)
Basophils Relative: 0 %
Eosinophils Absolute: 0.1 10*3/uL (ref 0.0–0.5)
Eosinophils Relative: 1 %
HCT: 26 % — ABNORMAL LOW (ref 36.0–46.0)
Hemoglobin: 9.2 g/dL — ABNORMAL LOW (ref 12.0–15.0)
Immature Granulocytes: 1 %
Lymphocytes Relative: 13 %
Lymphs Abs: 2.3 10*3/uL (ref 0.7–4.0)
MCH: 35.5 pg — ABNORMAL HIGH (ref 26.0–34.0)
MCHC: 35.4 g/dL (ref 30.0–36.0)
MCV: 100.4 fL — ABNORMAL HIGH (ref 80.0–100.0)
Monocytes Absolute: 1 10*3/uL (ref 0.1–1.0)
Monocytes Relative: 6 %
Neutro Abs: 13.4 10*3/uL — ABNORMAL HIGH (ref 1.7–7.7)
Neutrophils Relative %: 79 %
Platelets: 514 10*3/uL — ABNORMAL HIGH (ref 150–400)
RBC: 2.59 MIL/uL — ABNORMAL LOW (ref 3.87–5.11)
RDW: 17.2 % — ABNORMAL HIGH (ref 11.5–15.5)
WBC: 16.9 10*3/uL — ABNORMAL HIGH (ref 4.0–10.5)
nRBC: 0 % (ref 0.0–0.2)

## 2022-06-11 LAB — MAGNESIUM: Magnesium: 2.1 mg/dL (ref 1.7–2.4)

## 2022-06-11 LAB — COMPREHENSIVE METABOLIC PANEL
ALT: 8 U/L (ref 0–44)
AST: 12 U/L — ABNORMAL LOW (ref 15–41)
Albumin: 1.8 g/dL — ABNORMAL LOW (ref 3.5–5.0)
Alkaline Phosphatase: 88 U/L (ref 38–126)
Anion gap: 11 (ref 5–15)
BUN: 21 mg/dL — ABNORMAL HIGH (ref 6–20)
CO2: 27 mmol/L (ref 22–32)
Calcium: 8.5 mg/dL — ABNORMAL LOW (ref 8.9–10.3)
Chloride: 96 mmol/L — ABNORMAL LOW (ref 98–111)
Creatinine, Ser: 0.55 mg/dL (ref 0.44–1.00)
GFR, Estimated: >60 mL/min (ref >60)
Glucose, Bld: 111 mg/dL — ABNORMAL HIGH (ref 70–99)
Potassium: 3.6 mmol/L (ref 3.5–5.1)
Sodium: 134 mmol/L — ABNORMAL LOW (ref 135–145)
Total Bilirubin: 1.2 mg/dL (ref 0.3–1.2)
Total Protein: 6.4 g/dL — ABNORMAL LOW (ref 6.5–8.1)

## 2022-06-11 LAB — TRIGLYCERIDES, BODY FLUIDS: Triglycerides, Fluid: 69 mg/dL

## 2022-06-11 LAB — PH, BODY FLUID: pH, Body Fluid: 7

## 2022-06-11 LAB — PHOSPHORUS: Phosphorus: 3.9 mg/dL (ref 2.5–4.6)

## 2022-06-11 MED ORDER — ENSURE ENLIVE PO LIQD
237.0000 mL | Freq: Two times a day (BID) | ORAL | Status: DC
  Administered 2022-06-12 – 2022-06-14 (×4): 237 mL via ORAL

## 2022-06-11 NOTE — Progress Notes (Signed)
PROGRESS NOTE    Jillian Shepherd  JJH:[id number] DOB: 1985-10-20 DOA: 05/31/2022 PCP: [name], MD   Brief Narrative:  The patient is a obese 36 year old Caucasian female with a past medical history significant for but not limited to previous alcohol use was admitted earlier to the hospital this month for acute alcoholic pancreatitis.  She was then discharged but then subsequently readmitted to the hospital on 05/31/2022 with worsening abdominal pain.  CT showed persistent pancreatitis and splenic vein thrombosis.  She was started on IV fluid hydration and pain management and anticoagulation was given.  GI is following.  This splenic vein thrombosis was likely a sequelae of pancreatitis.  She does noted to have some clinical deterioration and worsening leukocytosis.  Given this GI recommended repeating CT imaging and she was noted to have a splenic hematoma as well as a new pancreatic pseudocyst.  Procalcitonin was negative but patient continued to have worsening leukocytosis and she was given Kcentra for the reversal of Eliquis and repeat hemoglobin/hematocrit showed stability and vital signs being stable but she is continuing to be tachycardic.  General surgery was consulted and they recommended transferring to [facility] for further evaluation.  She is currently getting IV fluid hydration still in general surgery as well as GI following this hospitalization at [facility] now.  Currently she remains n.p.o. at surgery's recommendation.    The patient acutely decompensated overnight and had to be placed on a nonrebreather and heated high flow nasal cannula.  Repeat chest x-ray was done and showed more fluid so pulmonary was consulted and they placed a chest tube with improvement of her symptoms.  She was also complained of some abdominal discomfort that is worsened.  She is at high risk for further decompensation.  GI recommended evaluation and discussion with IR and IR was consulted and they feel  that she is not a candidate for splenic vein thrombectomy but they could consider splenic embolization if she has recurrent bleeding is not a surgical candidate for splenectomy given the various potential outcomes of embolization as patient.  Critical care feels that she had a very large exudative pleural effusion and could have a parapneumonic effusion from a pneumonia or more likely exudative from her pancreatitis so they have initiated antibiotics for possible HAP.  06/11/22: Respiratory status is improving and pulmonary recommends out of bed to the chair and continuing pigtail to suction and discontinue when drainage is less than 200 mL.  She is drained 1.3 L in the last 24 hours.  Pulmonary recommends continuing empiric antibiotics for HAP.  She remains n.p.o. and gastroenterology recommending consideration for nutritional support with a NG or nasojejunal postpyloric feeding as well as nutritional consult.  GI also recommending once she has p.o. conservative trial of Creon.  Surgery feels that no surgical intervention is needed at this time and recommending continue monitor serial hemoglobin.  PT OT evaluated and she did desaturate on room air at rest to be placed on 2 L  Assessment and Plan:  Acute on chronic pancreatitis-with worsening abdominal pain in the setting of splenic hematoma SIRS in the setting of pancreatitis -Patient reports that she has abstained from alcohol since her last discharge -Worsening pain may be related to splenic vein thrombosis which is likely a sequelae of pancreatitis -Appreciate ongoingGI input with plans to repeat CT abdomen and consider initiation of antibiotics pending results. -Repeat CT scan done and showed "Interval development of 500+mL mixed-attenuation perisplenic/subcapsular splenic hematoma, without evident active extravasation. New 4.4  cm pseudocyst abutting the lateral wall of the gastric body. New moderate left pleural effusion and  consolidation/atelectasis in the visualized dependent aspect of the left lung. Some improvement in peripancreatic inflammatory change with stable peripancreatic pseudocysts. Small volume pelvic ascites. Incomplete opacification of the splenic vein consistent with previously described splenic vein thrombosis." -Noted to have some clinical deterioration with worsening leukocytosis -Hemoglobin/hematocrit went from 12.6/35.8 is slowly trending down and had been relatively stable but now trended down to 8.8/26.2 today -Patient will be n.p.o. again and start aggressive IV fluid hydration as well as pain management -CRP was elevated at 24.6 and will repeat in the morning -Surgery recommending serial hemoglobin and pain control but they have no plans for acute operative intervention -WBC is trending down and went from 25.7 is now 16.9 -GI will commending to continue maintenance fluids however this was stopped when the volume overload.  IR has been consulted as below.  GI recommending continuing supportive care and pain management and will keep her n.p.o. and may need some postpyloric tube feeding soon -Pain control via PCA pump   Acute respiratory failure with hypoxia in the setting of large exudative pleural effusion and volume overload -In the setting of above and she is requiring at least 4 L of nasal cannula to maintain O2 saturation greater 90% -Repeat CT scan was done on 11 3 which showed a moderate left-sided pleural effusion consolidation atelectasis as well as small volume pelvic ascites -Fluid hydration has been reduced yesterday -Fluid sent for analysis and showed total nucleated cell count was 3053 with cloudy appearance and neutrophil count being 62; Gram stain and fungus culture pending but currently showed no organisms -SpO2: (!) 88 % O2 Flow Rate (L/min): 2 L/min FiO2 (%): 100 % -ABG    Component Value Date/Time   PHART 7.45 06/10/2022 0830   PCO2ART 49 (H) 06/10/2022 0830   PO2ART  139 (H) 06/10/2022 0830   HCO3 34.1 (H) 06/10/2022 0830   O2SAT 100 06/10/2022 0830      -The fluids have been stopped and she was given diuresis of IV Lasix 40 mils x1 and now she is n.p.o. still. -We will continue monitor patient's respiratory status carefully and will continue supplemental oxygen via nasal cannula wean O2 as tolerated -Continuous pulse oximetry maintain O2 saturation greater 90% -Prior to discharge she will need an ambulatory home O2 screen and we will repeat a chest x-ray in the morning  -Repeat chest x-ray yesterday after thoracentesis showed "Moderate-sized potentially partially loculated residual left-sided effusion post thoracentesis. No pneumothorax. Left mid and lower lung heterogeneous/consolidative opacities, potentially atelectasis though worrisome for multifocal infection.  Suspected trace right-sided effusion with suspected mild pulmonary edema." -Because of her worsening respiratory status pulmonary was consulted and they placed a chest tube and initiated antibiotics -Pulmonary recommending repeating chest x-ray in a.m. and if she has a persistent effusion she will likely need TPA and dornase -They are giving her Toradol for pain control continue Dilaudid.  They are also recommending PPI while she is on Toradol -Procalcitonin level is being checked and trending down but she is still being placed on antibiotics as above -We will likely need postpyloric feeding and will defer to general surgery initiate   Hypomagnesemia -Patient's magnesium level is 1.4 and was repleted yesterday and is improved to 2.1 -Continue monitor and replete as necessary and repeat magnesium in the a.m.   Hypoalbuminemia -Patient's albumin level is now 2.0 again and repeat is now 1.8 -Continue to monitor and  trend and repeat CMP in a.m.   Acute splenic vein thrombosis -Eliquis has been reversed with Kcentra given the splenic hematoma -Found on CT scan to have a subcapsular hematoma and  was transferred to [facility] -Surgery following and recommending n.p.o.  Patient did have some increasing abdominal pain and increased work of breathing.  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   Loose stool -Likely chronic -Had a colonoscopy which showed no evidence of microscopic colitis or celiac disease in the work-up in 2022 -GI recommending obtaining fecal elastase in outpatient setting   Obesity -Complicates overall prognosis and care -Estimated body mass index is 31.91 kg/m as calculated from the following:   Height as of this encounter: '5\' 3"'$  (1.6 m).   Weight as of this encounter: 81.7 kg.  -Weight Loss and Dietary Counseling given  DVT prophylaxis: Place and maintain sequential compression device Start: 06/08/22 1450    Code Status: Full Code Family Communication: No family currently at bedside  Disposition Plan:  Level of care: Progressive Status is: Inpatient Remains inpatient appropriate because: She needs specialist clearance prior to safe discharge disposition and tolerance of diet   Consultants:  General Surgery Gastroenterology Pulmonary IR  Procedures:  As delineated as above  Antimicrobials:  Anti-infectives (From admission, onward)    Start     Dose/Rate Route Frequency Ordered Stop   06/11/22 0000  vancomycin (VANCOREADY) IVPB 750 mg/150 mL        750 mg 150 mL/hr over 60 Minutes Intravenous Every 12 hours 06/10/22 1131     06/10/22 1400  ceFEPIme (MAXIPIME) 2 g in sodium chloride 0.9 % 100 mL IVPB         2 g 200 mL/hr over 30 Minutes Intravenous Every 8 hours 06/10/22 1131     06/10/22 1230  vancomycin (VANCOREADY) IVPB 1500 mg/300 mL        1,500 mg 150 mL/hr over 120 Minutes Intravenous  Once 06/10/22 1131 06/10/22 1503       Subjective: Seen and examined at bedside no is not hurting as bad today and respiratory status is improving.  States that she feels better and slept okay.  No nausea or vomiting.  Denies any other concerns or complaints at this time and PT OT recommending outpatient PT and OT.  Objective: Vitals:   06/11/22 0120 06/11/22 0131 06/11/22 0423 06/11/22 0850  BP:  122/76 124/83 117/88  Pulse:  99 99   Resp:  20 19   Temp:   97.9 F (36.6 C)   TempSrc:   Oral   SpO2: 93% 93% 97% (!) 88%  Weight:      Height:        Intake/Output Summary (Last 24 hours) at 06/11/2022 1737 Last data filed at 06/11/2022 0440 Gross per 24 hour  Intake 426.66 ml  Output 1640 ml  Net -1213.34 ml   Filed Weights   05/31/22 0824 06/08/22 1454  Weight: 83.6 kg 81.7 kg   Examination: Physical Exam:  Constitutional: WN/WD obese Caucasian female currently no acute distress appears calm Respiratory:  Diminished to auscultation bilaterally with coarse breath sounds worse on the left compared to right and she has a chest tube in place now.  Also has some slight crackles.  She is not tachypneic like she was yesterday, wearing supplemental oxygen via nasal cannula Cardiovascular: Improved heart rate is not as tachycardic as yesterday but remains tachycardic and has a regular rhythm, no murmurs / rubs / gallops. S1 and S2 auscultated.  Has trace lower extremity edema Abdomen: Soft, non-tender, distended secondary body habitus. Bowel sounds positive.  GU: Deferred. Musculoskeletal: No clubbing / cyanosis of digits/nails. No joint deformity upper and lower extremities.  Skin: No rashes, lesions, ulcers on limited skin evaluation. No induration; Warm and dry.  Neurologic: CN 2-12 grossly  intact with no focal deficits. Romberg sign and cerebellar reflexes not assessed.  Psychiatric: Normal judgment and insight. Alert and oriented x 3. Normal mood and appropriate affect.   Data Reviewed: I have personally reviewed following labs and imaging studies  CBC: Recent Labs  Lab 06/07/22 0415 06/08/22 0438 06/08/22 1354 06/09/22 0145 06/09/22 0558 06/10/22 0539 06/10/22 0911 06/10/22 1607 06/10/22 2139 06/11/22 0433 06/11/22 0916 06/11/22 1608  WBC 11.5* 22.6*  --  25.7*  --  21.1*  --   --   --  16.9*  --   --   NEUTROABS  --   --   --   --   --  17.5*  --   --   --  13.4*  --   --   HGB 14.0 13.3   < > 11.2*   < > 9.8*   < > 9.8* 9.4* 9.2* 9.2* 8.8*  HCT 40.1 38.6   < > 31.4*   < > 27.3*   < > 27.6* 26.6* 26.0* 25.8* 26.2*  MCV 102.6* 102.9*  --  99.7  --  100.0  --   --   --  100.4*  --   --   PLT 394 503*  --  477*  --  482*  --   --   --  514*  --   --    < > = values in this interval not displayed.   Basic Metabolic Panel: Recent Labs  Lab 06/08/22 0438 06/09/22 0145 06/09/22 0558 06/10/22 0539 06/11/22 0433  NA 133* 138 134* 134* 134*  K 4.1 4.4 4.4 4.1 3.6  CL 95* 96* 97* 97* 96*  CO2 '23 27 27 27 27  '$ GLUCOSE 196* 144* 133* 136* 111*  BUN '8 8 10 13 '$ 21*  CREATININE 0.48 0.51 0.47 0.34* 0.55  CALCIUM 8.9 9.1 8.4* 8.5* 8.5*  MG  --  1.4* 1.4* 2.1 2.1  PHOS  --  4.3  --  3.3 3.9   GFR: Estimated Creatinine Clearance: 97.4 mL/min (by C-G formula based on SCr of 0.55 mg/dL). Liver Function Tests: Recent Labs  Lab 06/09/22 0145 06/09/22 0558 06/10/22 0539 06/11/22 0433  AST 13* 14* 12* 12*  ALT '8 8 8 8  '$ ALKPHOS 112 115 100 88  BILITOT 0.8 0.7 0.7 1.2  PROT 6.4* 6.3* 6.4* 6.4*  ALBUMIN 2.1* 2.0* 2.0* 1.8*   Recent Labs  Lab 06/09/22 0145 06/10/22 0539  LIPASE 30 25   No results for input(s): "AMMONIA" in the last 168 hours. Coagulation Profile: No results for input(s): "INR", "PROTIME" in the last 168 hours. Cardiac Enzymes: No results  for input(s): "CKTOTAL", "CKMB", "CKMBINDEX", "TROPONINI" in the last 168 hours. BNP (last 3 results) No results for input(s): "PROBNP"  in the last 8760 hours. HbA1C: No results for input(s): "HGBA1C" in the last 72 hours. CBG: No results for input(s): "GLUCAP" in the last 168 hours. Lipid Profile: No results for input(s): "CHOL", "HDL", "LDLCALC", "TRIG", "CHOLHDL", "LDLDIRECT" in the last 72 hours. Thyroid Function Tests: Recent Labs    06/10/22 0539  TSH 2.265   Anemia Panel: No results for input(s): "VITAMINB12", "FOLATE", "FERRITIN", "TIBC", "IRON", "RETICCTPCT" in the last 72 hours. Sepsis Labs: Recent Labs  Lab 06/08/22 0438 06/09/22 0145 06/09/22 0155 06/09/22 0558 06/10/22 0539  PROCALCITON 0.29 0.20  --   --  0.21  LATICACIDVEN  --   --  1.0 0.8  --     Recent Results (from the past 240 hour(s))  MRSA Next Gen by PCR, Nasal     Status: None   Collection Time: 06/08/22  2:34 PM   Specimen: Nasal Mucosa; Nasal Swab  Result Value Ref Range Status   MRSA by PCR Next Gen NOT DETECTED NOT DETECTED Final    Comment: (NOTE) The GeneXpert MRSA Assay (FDA approved for NASAL specimens only), is one component of a comprehensive MRSA colonization surveillance program. It is not intended to diagnose MRSA infection nor to guide or monitor treatment for MRSA infections. Test performance is not FDA approved in patients less than 73 years old. Performed at [facility], [address], [place], [place] [address]   Culture, body fluid w Gram Stain-bottle     Status: None (Preliminary result)   Collection Time: 06/09/22 10:56 AM   Specimen: Pleura  Result Value Ref Range Status   Specimen Description PLEURAL  Final   Special Requests NONE  Final   Culture   Final    NO GROWTH 2 DAYS Performed at [facility] [address], [place], [place] [address]    Report Status PENDING  Incomplete  Gram stain     Status: None   Collection Time: 06/09/22 10:56 AM   Specimen:  Pleura  Result Value Ref Range Status   Specimen Description PLEURAL  Final   Special Requests NONE  Final   Gram Stain   Final    FEW WBC PRESENT,BOTH PMN AND MONONUCLEAR NO ORGANISMS SEEN Performed at [facility], [address] [place], [place] [address]    Report Status 06/09/2022 FINAL  Final    Radiology Studies: DG CHEST PORT 1 VIEW  Result Date: 06/11/2022 CLINICAL DATA:  Left-sided chest tube. EXAM: PORTABLE CHEST 1 VIEW COMPARISON:  June 10, 2022. FINDINGS: Stable cardiomegaly. Stable left-sided chest tube is noted no definite pneumothorax is noted currently. Left pleural effusion and left basilar atelectasis is again noted. IMPRESSION: Stable position of left-sided chest tube with possible kink within it. No definite pneumothorax is noted. Stable left basilar opacity. Electronically Signed   By: Marijo Conception M.D.   On: 06/11/2022 08:16   DG Chest Port 1 View  Result Date: 06/10/2022 CLINICAL DATA:  Left chest tube placement EXAM: PORTABLE CHEST 1 VIEW COMPARISON:  Previous studies including the examination done earlier today FINDINGS: Transverse diameter of heart is increased. There is significant interval decrease in left pleural effusion after placement of left chest tube. There is a kink in the distal course of the left chest tube approximately 7 cm from the tip. There is possible trace amount of pneumothorax in the lateral aspect of left upper lung field. There is possible tiny left apical pneumothorax. Residual increased density in left lower lung fields suggest possible underlying  atelectasis/pneumonia. There is increase in patchy infiltrate in right upper lung fields. IMPRESSION: There is marked interval decrease in left pleural effusion after placement of left chest tube. Residual increased density in left lower lung fields may be due to pleural effusion and underlying atelectasis/pneumonia. A kink is noted in the distal course of left chest tube. Possible tiny  pneumothorax is noted in the left apex and lateral aspect of left upper lung field. There is interval increase in patchy infiltrate in right upper lung field suggesting worsening of pneumonia. Electronically Signed   By: Elmer Picker M.D.   On: 06/10/2022 11:57   DG Abd 1 View  Result Date: 06/10/2022 CLINICAL DATA:  Abdominal pain and distension EXAM: ABDOMEN - 1 VIEW COMPARISON:  None Available. FINDINGS: Scattered large and small bowel gas is noted. No free air is seen. Increasing left-sided effusion is noted when compare with the prior chest exam. No acute bony abnormality is seen. IMPRESSION: Unremarkable abdomen. Increasing left-sided pleural effusion. Electronically Signed   By: Inez Catalina M.D.   On: 06/10/2022 09:56   DG CHEST PORT 1 VIEW  Result Date: 06/10/2022 CLINICAL DATA:  Shortness of breath EXAM: PORTABLE CHEST 1 VIEW COMPARISON:  06/09/2022 FINDINGS: Cardiac shadow is enlarged but stable. Increasing left-sided pleural effusion is noted when compare with the prior exam. Persistent and slightly increased airspace opacity in the right apex is noted. No bony abnormality is noted. IMPRESSION: Increasing left-sided effusion compared with the prior exam. Increasing right apical airspace opacity. Electronically Signed   By: Inez Catalina M.D.   On: 06/10/2022 09:55    Scheduled Meds:  sodium chloride   Intravenous Once   Chlorhexidine Gluconate Cloth  6 each Topical Daily   folic acid  1 mg Oral Daily    HYDROmorphone (DILAUDID) injection  1 mg Intravenous Q4H   influenza vac split quadrivalent PF  0.5 mL Intramuscular Tomorrow-1000   lidocaine (PF)  6 mL Intradermal Once   potassium chloride SA  60 mEq Oral Daily   sodium chloride flush  10 mL Intrapleural Q8H   Continuous Infusions:  ceFEPime (MAXIPIME) IV 2 g (06/11/22 1352)   vancomycin 750 mg (06/11/22 1537)    LOS: 10 days   Raiford Noble, DO [facility] Available via Epic secure chat 7am-7pm After these  hours, please refer to coverage provider listed on [email or web address] 06/11/2022, 5:37 PM

## 2022-06-11 NOTE — Evaluation (Signed)
Occupational Therapy Evaluation Patient Details Name: Jillian Shepherd MRN: [id number] DOB: Nov 08, 1985 Today's Date: 06/11/2022   History of Present Illness The patient is a obese 36 year old Caucasian female with a past medical history significant for but not limited to previous alcohol use was admitted earlier to the hospital this month for acute alcoholic pancreatitis.   Clinical Impression   Pt in bed upon therapy arrival and agreeable to participate in OT evaluation. Pt overall performing near baseline with BADL although due to chest tube, supplemental oxygen is needed. Due to recent hospitalization, pt is experiencing slight decreased endurance and activity tolerance. Recommending outpatient OT services at discharge to address.       Recommendations for follow up therapy are one component of a multi-disciplinary discharge planning process, led by the attending physician.  Recommendations may be updated based on patient status, additional functional criteria and insurance authorization.   Follow Up Recommendations  Outpatient OT    Assistance Recommended at Discharge PRN  Patient can return home with the following Assistance with cooking/housework;Assist for transportation    Functional Status Assessment  Patient has had a recent decline in their functional status and demonstrates the ability to make significant improvements in function in a reasonable and predictable amount of time.  Equipment Recommendations  None recommended by OT       Precautions / Restrictions Precautions Precaution Comments: Chest tube, NPO - tube feeding Restrictions Weight Bearing Restrictions: No      Mobility Bed Mobility Overal bed mobility: Modified Independent     General bed mobility comments: utilized bed railings to transition to sitting on EOB. Patient Response: Cooperative  Transfers Overall transfer level: Needs assistance Equipment used: None Transfers: Sit to/from Stand, Bed to  chair/wheelchair/BSC Sit to Stand: Supervision     Step pivot transfers: Min guard     General transfer comment: 2nd person utilized for line management.      Balance Overall balance assessment: No apparent balance deficits (not formally assessed)         ADL either performed or assessed with clinical judgement   ADL Overall ADL's : Modified independent             Vision Baseline Vision/History: 0 No visual deficits Ability to See in Adequate Light: 0 Adequate Patient Visual Report: No change from baseline Vision Assessment?: No apparent visual deficits     Perception Perception Perception: Within Functional Limits   Praxis Praxis Praxis: Intact    Pertinent Vitals/Pain Pain Assessment Pain Assessment: 0-10 Pain Score: 4  Pain Location: left chest tube site Pain Descriptors / Indicators: Grimacing, Discomfort Pain Intervention(s): Monitored during session     Hand Dominance Right   Extremity/Trunk Assessment Upper Extremity Assessment Upper Extremity Assessment: Overall WFL for tasks assessed   Lower Extremity Assessment Lower Extremity Assessment: Defer to PT evaluation       Communication Communication Communication: No difficulties   Cognition Arousal/Alertness: Awake/alert Behavior During Therapy: WFL for tasks assessed/performed Overall Cognitive Status: Within Functional Limits for tasks assessed       General Comments  VS monitored during session. At start of session with pt in bed: 104 bpm, 117/88. Pt on 1L O2 resting in bed with SpO2 reading in 90's. Once out of bed and completing functional mobility from bed to toilet, oxygen flow was increased to 3L due to SpO2 dropping to 85. HR increased to 130 when up using toilet. Pt completed functional mobility in hallway with supplemental oxygen removed. SpO2 at 92 then dropped to  85. Pt seated in recliner with oxygen on at 1L although SpO2 remained in upper 80's. Oxygen flow increased to 2L with  SpO2 increasing back to 90's. When seated in recliner, HR decreased to 120 and slowly decreased to 115. Pt reports no feeling of heart racing.            Home Living Family/patient expects to be discharged to:: Private residence Living Arrangements: Spouse/significant other Available Help at Discharge: Family;Available 24 hours/day (Husband and Son (14/yo).) Type of Home: Mobile home Home Access: Stairs to enter Entrance Stairs-Number of Steps: 2 Entrance Stairs-Rails: Right;Left;Can reach both Home Layout: One level     Bathroom Shower/Tub: Teacher, early years/pre: Handicapped height     Home Equipment: None   Additional Comments: [place] full time I living facillity in [place]      Prior Functioning/Environment Prior Level of Function : Driving;Working/employed;Independent/Modified Independent          OT Problem List: Decreased activity tolerance;Cardiopulmonary status limiting activity;Decreased strength;Pain         OT Goals(Current goals can be found in the care plan section) Acute Rehab OT Goals Patient Stated Goal: to get stronger  OT Frequency:      Co-evaluation PT/OT/SLP Co-Evaluation/Treatment: Yes Reason for Co-Treatment: Complexity of the patient's impairments (multi-system involvement);To address functional/ADL transfers   OT goals addressed during session: ADL's and self-care;Strengthening/ROM      AM-PAC OT "6 Clicks" Daily Activity     Outcome Measure Help from another person eating meals?: None Help from another person taking care of personal grooming?: None Help from another person toileting, which includes using toliet, bedpan, or urinal?: None Help from another person bathing (including washing, rinsing, drying)?: None Help from another person to put on and taking off regular upper body clothing?: None Help from another person to put on and taking off regular lower body clothing?: None 6 Click Score: 24    End of Session Equipment Utilized During Treatment: Gait belt  Activity Tolerance: Patient tolerated treatment well Patient left: in chair;with call bell/phone within reach  OT Visit Diagnosis: Muscle weakness (generalized) (M62.81)                Time: 3299-2426 OT Time Calculation (min): 33 min Charges:  OT General Charges $OT Visit: 1 Visit OT Evaluation $OT Eval Moderate Complexity: 1 Mod  [employer], OTR/L,CBIS  Supplemental OT - [facility] and [facility]   Meesha Sek, Clarene Duke 06/11/2022, 10:48 AM

## 2022-06-11 NOTE — Progress Notes (Signed)
NAME:  Jillian Shepherd, MRN:  [id number], DOB:  [birth year], LOS: 53 ADMISSION DATE:  05/31/2022, CONSULTATION DATE:  11/5 REFERRING MD:  Alfredia Ferguson, CHIEF COMPLAINT:  acute hypoxic resp failure  History of Present Illness:  36 year old female patient critical care asked to evaluate acutely on 11/5 for acute worsening of hypoxic respiratory failure.  She was initially hospitalized for acute pancreatitis earlier in October 2023, then subsequently readmitted on 10/26 with worsening abdominal pain.  She was found to have splenic thrombosis, and was placed on anticoagulation.  She been treated medically, over the course of her hospitalization she has had worsening abdominal pain, developed splenic hematoma, as well as progression of pancreatic pseudocyst.  She required reversal of anticoagulation with Kcentra, and since her hemoglobin is stabilized.  She has been followed by general surgery as well as gastroenterology.  However more frequently over the last couple days she has had progressive respiratory failure requiring escalating supplemental oxygen needs.  A CT scan was done on 11/3 that showed a moderate left-sided pleural effusion fairly significant left-sided atelectasis and small amount of abdominal ascites.  She underwent a therapeutic and diagnostic thoracentesis on 11/4 with fluid was exudative by lights criteria.  Over the course of the evening her oxygen requirements have continued to improve she is now on 100% FiO2 as well as heated high flow, portable chest x-ray shows reaccumulation of left-sided pleural effusion that appears loculated, critical care asked to evaluate for progressive respiratory failure.  Pertinent  Medical History   Alcohol abuse, acute alcoholic pancreatitis, splenic vein thrombosis, Significant Hospital Events: Including procedures, antibiotic start and stop dates in addition to other pertinent events   10/28 admitted for worsening abd pain w/ splenic vein thrombosis as well  as worsening pancreatitis and pseudocyst. IVFs started. Started on [facility].  11/1-11/3 worsening pain. CT of the abdomen pelvis with IV contrast was ordered stat which showed presence of new perisplenic hematoma without active extravasation and a 4.4 cm collection in the lateral wall of the gastric body consistent with possible pseudocyst. Got K centra. AC stopped 11/4 incr'd O2 needs. Thora 500 ml, exudate by lights criteria 11/5 marked increase in O2 needs. High flow and FM. CXR w/ marked inc Left sided effusion. Pt placed on HFNC. Left chest tube placed abx started for HCAP coverage (vanc and cefepime)   Interim History / Subjective:   Out of bed to chair. Chest pain is improved Afebrile. On nasal cannula  Objective   Blood pressure 124/83, pulse 99, temperature 97.9 F (36.6 C), temperature source Oral, resp. rate 19, height '5\' 3"'$  (1.6 m), weight 81.7 kg, last menstrual period 05/01/2022, SpO2 97 %.        Intake/Output Summary (Last 24 hours) at 06/11/2022 1302 Last data filed at 06/11/2022 0440 Gross per 24 hour  Intake 426.66 ml  Output 1640 ml  Net -1213.34 ml    Filed Weights   05/31/22 0824 06/08/22 1454  Weight: 83.6 kg 81.7 kg    Examination: General: Young woman sitting in a chair, no distress HENT: NCAT no JVD MMM Lungs: Decreased breath sounds left base, no accessory muscle use Cardiovascular: tachy rrr no MRG  Abdomen: Soft, nontender Extremities: warm and dry trace edema  Neuro: awake and oriented    Labs show mild hyponatremia, albumin 1.8, decreased leukocytosis and stable anemia Chest x-ray shows improved left effusion  Resolved Hospital Problem list     Assessment & Plan:  Acute hypoxic respiratory failure 2/2 large left pleural effusion w/  atelectasis vs infiltrate.  -effusion exudate by lights, neutrophilic  Plan Has drained 1.3 L last 24 hours Continue pigtail to suction, will DC when drainage less than 200 cc F/u pending culture date Empiric  cefepime/vancomycin for HAP Concern for spirometry   Acute on chronic alcoholic pancreatitis w/ pseudocyst.  Followed by general surg. 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 Plan Replacing K    Best Practice (right click and "Reselect all SmartList Selections" daily)   Diet/type: NPO DVT prophylaxis: SCD GI prophylaxis: N/A Lines: N/A Foley:  N/A Code Status:  full code Last date of multidisciplinary goals of care discussion [per primary ]  Labs   CBC: Recent Labs  Lab 06/07/22 0415 06/08/22 0438 06/08/22 1354 06/09/22 0145 06/09/22 0558 06/10/22 0539 06/10/22 0911 06/10/22 1607 06/10/22 2139 06/11/22 0433 06/11/22 0916  WBC 11.5* 22.6*  --  25.7*  --  21.1*  --   --   --  16.9*  --   NEUTROABS  --   --   --   --   --  17.5*  --   --   --  13.4*  --   HGB 14.0 13.3   < > 11.2*   < > 9.8* 10.4* 9.8* 9.4* 9.2* 9.2*  HCT 40.1 38.6   < > 31.4*   < > 27.3* 29.5* 27.6* 26.6* 26.0* 25.8*  MCV 102.6* 102.9*  --  99.7  --  100.0  --   --   --  100.4*  --   PLT 394 503*  --  477*  --  482*  --   --   --  514*  --    < > = values in this interval not displayed.     Basic Metabolic Panel: Recent Labs  Lab 06/08/22 0438 06/09/22 0145 06/09/22 0558 06/10/22 0539 06/11/22 0433  NA 133* 138 134* 134* 134*  K 4.1 4.4 4.4 4.1 3.6  CL 95* 96* 97* 97* 96*  CO2 '23 27 27 27 27  '$ GLUCOSE 196* 144* 133* 136* 111*  BUN '8 8 10 13 '$ 21*  CREATININE 0.48 0.51 0.47 0.34* 0.55  CALCIUM 8.9 9.1 8.4* 8.5* 8.5*  MG  --  1.4* 1.4* 2.1 2.1  PHOS  --  4.3  --  3.3 3.9    GFR: Estimated Creatinine Clearance: 97.4 mL/min (by C-G formula based on SCr of 0.55 mg/dL). Recent Labs  Lab 06/08/22 0438 06/09/22 0145  06/09/22 0155 06/09/22 0558 06/10/22 0539 06/11/22 0433  PROCALCITON 0.29 0.20  --   --  0.21  --   WBC 22.6* 25.7*  --   --  21.1* 16.9*  LATICACIDVEN  --   --  1.0 0.8  --   --      Liver Function Tests: Recent Labs  Lab 06/09/22 0145 06/09/22 0558 06/10/22 0539 06/11/22 0433  AST 13* 14* 12* 12*  ALT '8 8 8 8  '$ ALKPHOS 112 115 100 88  BILITOT 0.8 0.7 0.7 1.2  PROT 6.4* 6.3* 6.4* 6.4*  ALBUMIN 2.1* 2.0* 2.0* 1.8*    Recent Labs  Lab 06/09/22 0145 06/10/22 0539  LIPASE 30 25    No results for input(s): "AMMONIA" in the last 168 hours.  ABG  Component Value Date/Time   PHART 7.45 06/10/2022 0830   PCO2ART 49 (H) 06/10/2022 0830   PO2ART 139 (H) 06/10/2022 0830   HCO3 34.1 (H) 06/10/2022 0830   O2SAT 100 06/10/2022 0830     Coagulation Profile: No results for input(s): "INR", "PROTIME" in the last 168 hours.  Cardiac Enzymes: No results for input(s): "CKTOTAL", "CKMB", "CKMBINDEX", "TROPONINI" in the last 168 hours.  HbA1C: Hgb A1c MFr Bld  Date/Time Value Ref Range Status  05/02/2022 09:03 AM 5.3 <5.7 % of total Hgb Final    Comment:    For the purpose of screening for the presence of diabetes: . <5.7%       Consistent with the absence of diabetes 5.7-6.4%    Consistent with increased risk for diabetes             (prediabetes) > or =6.5%  Consistent with diabetes . This assay result is consistent with a decreased risk of diabetes. . Currently, no consensus exists regarding use of hemoglobin A1c for diagnosis of diabetes in children. . According to American Diabetes Association (ADA) guidelines, hemoglobin A1c <7.0% represents optimal control in non-pregnant diabetic patients. Different metrics may apply to specific patient populations.  Standards of Medical Care in Diabetes(ADA). .     CBG: No results for input(s): "GLUCAP" in the last 168 hours.   Kara Mead MD. Shade Flood. [facility] Pager : [phone]  If no  response to pager , please call [phone] until 7 pm After 7:00 pm call Elink  [phone]   06/11/2022

## 2022-06-11 NOTE — Plan of Care (Signed)
  Problem: Education: Goal: Knowledge of General Education information will improve Description: Including pain rating scale, medication(s)/side effects and non-pharmacologic comfort measures Outcome: Progressing   Problem: Health Behavior/Discharge Planning: Goal: Ability to manage health-related needs will improve Outcome: Progressing   Problem: Health Behavior/Discharge Planning: Goal: Ability to manage health-related needs will improve Outcome: Progressing   Problem: Clinical Measurements: Goal: Respiratory complications will improve Outcome: Progressing   Problem: Clinical Measurements: Goal: Cardiovascular complication will be avoided Outcome: Progressing   Problem: Pain Managment: Goal: General experience of comfort will improve Outcome: Progressing   Problem: Safety: Goal: Ability to remain free from injury will improve Outcome: Progressing   Problem: Skin Integrity: Goal: Risk for impaired skin integrity will decrease Outcome: Progressing

## 2022-06-11 NOTE — Progress Notes (Addendum)
Progress Note  Primary GI: Dr. Marius Ditch   Subjective  Chief Complaint: Pancreatitis  Patient sitting up in bed, starting physical therapy. States breathing improved greatly with chest tubefor hydrothorax, no shortness of breath or chest pain. Currently n.p.o., denies nausea, vomiting, fever or chills overnight. Patient's not had a bowel movement for a week, but has been passing gas.     Objective   Vital signs in last 24 hours: Temp:  [97.9 F (36.6 C)-98.5 F (36.9 C)] 97.9 F (36.6 C) (11/06 0423) Pulse Rate:  [99-123] 99 (11/06 0423) Resp:  [18-20] 19 (11/06 0423) BP: (121-144)/(76-106) 124/83 (11/06 0423) SpO2:  [93 %-100 %] 97 % (11/06 0423) Last BM Date : 06/04/22 Last BM recorded by nurses in past 5 days Stool Type: -- (unable to visualize) (06/10/2022  8:00 AM)  General:   female in no acute distress  Heart:  Regular rhythm, sinus tachycardia; no murmurs Pulm: Clear anteriorly; no wheezing, chest tube in place, 2 L [place] Abdomen:  Soft, Obese AB, Sluggish bowel sounds. mild tenderness in the epigastrium. Without guarding and Without rebound, No organomegaly appreciated. Extremities:  without  edema. Neurologic:  Alert and  oriented x4;  No focal deficits.  Psych:  Cooperative. Normal mood and affect.  Intake/Output from previous day: 11/05 0701 - 11/06 0700 In: 426.7 [I.V.:6; IV Piggyback:400.7] Out: 1640 [Urine:350; Chest Tube:1290] Intake/Output this shift: No intake/output data recorded.  Studies/Results: DG CHEST PORT 1 VIEW  Result Date: 06/11/2022 CLINICAL DATA:  Left-sided chest tube. EXAM: PORTABLE CHEST 1 VIEW COMPARISON:  June 10, 2022. FINDINGS: Stable cardiomegaly. Stable left-sided chest tube is noted no definite pneumothorax is noted currently. Left pleural effusion and left basilar atelectasis is again noted. IMPRESSION: Stable position of left-sided chest tube with possible kink within it. No definite pneumothorax is noted. Stable left basilar  opacity. Electronically Signed   By: Marijo Conception M.D.   On: 06/11/2022 08:16   DG Chest Port 1 View  Result Date: 06/10/2022 CLINICAL DATA:  Left chest tube placement EXAM: PORTABLE CHEST 1 VIEW COMPARISON:  Previous studies including the examination done earlier today FINDINGS: Transverse diameter of heart is increased. There is significant interval decrease in left pleural effusion after placement of left chest tube. There is a kink in the distal course of the left chest tube approximately 7 cm from the tip. There is possible trace amount of pneumothorax in the lateral aspect of left upper lung field. There is possible tiny left apical pneumothorax. Residual increased density in left lower lung fields suggest possible underlying atelectasis/pneumonia. There is increase in patchy infiltrate in right upper lung fields. IMPRESSION: There is marked interval decrease in left pleural effusion after placement of left chest tube. Residual increased density in left lower lung fields may be due to pleural effusion and underlying atelectasis/pneumonia. A kink is noted in the distal course of left chest tube. Possible tiny pneumothorax is noted in the left apex and lateral aspect of left upper lung field. There is interval increase in patchy infiltrate in right upper lung field suggesting worsening of pneumonia. Electronically Signed   By: Elmer Picker M.D.   On: 06/10/2022 11:57   DG Abd 1 View  Result Date: 06/10/2022 CLINICAL DATA:  Abdominal pain and distension EXAM: ABDOMEN - 1 VIEW COMPARISON:  None Available. FINDINGS: Scattered large and small bowel gas is noted. No free air is seen. Increasing left-sided effusion is noted when compare with the prior chest exam. No acute bony  abnormality is seen. IMPRESSION: Unremarkable abdomen. Increasing left-sided pleural effusion. Electronically Signed   By: Inez Catalina M.D.   On: 06/10/2022 09:56   DG CHEST PORT 1 VIEW  Result Date: 06/10/2022 CLINICAL  DATA:  Shortness of breath EXAM: PORTABLE CHEST 1 VIEW COMPARISON:  06/09/2022 FINDINGS: Cardiac shadow is enlarged but stable. Increasing left-sided pleural effusion is noted when compare with the prior exam. Persistent and slightly increased airspace opacity in the right apex is noted. No bony abnormality is noted. IMPRESSION: Increasing left-sided effusion compared with the prior exam. Increasing right apical airspace opacity. Electronically Signed   By: Inez Catalina M.D.   On: 06/10/2022 09:55   DG CHEST PORT 1 VIEW  Result Date: 06/09/2022 CLINICAL DATA:  Post left-sided thoracentesis. EXAM: PORTABLE CHEST 1 VIEW COMPARISON:  CT abdomen and pelvis-06/08/2022; chest radiograph-06/01/2022 FINDINGS: Moderate-sized potentially partially loculated residual left-sided pleural effusion post thoracentesis. No pneumothorax. There is obscuration of the left heart border secondary to left-sided effusion associated left mid and lower lung heterogeneous/consolidative opacities. Trace right-sided pleural effusion is not excluded. Pulmonary vasculature is indistinct with cephalization of flow. No acute osseous abnormalities. IMPRESSION: 1. Moderate-sized potentially partially loculated residual left-sided effusion post thoracentesis. No pneumothorax. 2. Left mid and lower lung heterogeneous/consolidative opacities, potentially atelectasis though worrisome for multifocal infection. 3. Suspected trace right-sided effusion with suspected mild pulmonary edema. Electronically Signed   By: Sandi Mariscal M.D.   On: 06/09/2022 11:13   US THORACENTESIS ASP PLEURAL SPACE W/IMG GUIDE  Result Date: 06/09/2022 INDICATION: Left pleural effusion, shortness of breath EXAM: ULTRASOUND GUIDED LEFT THORACENTESIS MEDICATIONS: None. COMPLICATIONS: None immediate. PROCEDURE: An ultrasound guided thoracentesis was thoroughly discussed with the patient and questions answered. The benefits, risks, alternatives and complications were also  discussed. The patient understands and wishes to proceed with the procedure. Written consent was obtained. Ultrasound was performed to localize and mark an adequate pocket of fluid in the left chest. The area was then prepped and draped in the normal sterile fashion. 1% Lidocaine was used for local anesthesia. Under ultrasound guidance a 6 Fr Safe-T-Centesis catheter was introduced. Thoracentesis was performed. The catheter was removed and a dressing applied. FINDINGS: A total of approximately 500 cc of pleural fluid was removed. Samples were sent to the laboratory as requested by the clinical team. IMPRESSION: Successful ultrasound guided left thoracentesis yielding 500 cc of pleural fluid. Performed and dictated by Pasty Spillers, PA-C Electronically Signed   By: Sandi Mariscal M.D.   On: 06/09/2022 11:09    Lab Results: Recent Labs    06/09/22 0145 06/09/22 0558 06/10/22 0539 06/10/22 0911 06/10/22 2139 06/11/22 0433 06/11/22 0916  WBC 25.7*  --  21.1*  --   --  16.9*  --   HGB 11.2*   < > 9.8*   < > 9.4* 9.2* 9.2*  HCT 31.4*   < > 27.3*   < > 26.6* 26.0* 25.8*  PLT 477*  --  482*  --   --  514*  --    < > = values in this interval not displayed.   BMET Recent Labs    06/09/22 0558 06/10/22 0539 06/11/22 0433  NA 134* 134* 134*  K 4.4 4.1 3.6  CL 97* 97* 96*  CO2 '27 27 27  '$ GLUCOSE 133* 136* 111*  BUN 10 13 21*  CREATININE 0.47 0.34* 0.55  CALCIUM 8.4* 8.5* 8.5*   LFT Recent Labs    06/11/22 0433  PROT 6.4*  ALBUMIN 1.8*  AST  12*  ALT 8  ALKPHOS 88  BILITOT 1.2   PT/INR No results for input(s): "LABPROT", "INR" in the last 72 hours.   Scheduled Meds:  sodium chloride   Intravenous Once   Chlorhexidine Gluconate Cloth  6 each Topical Daily   folic acid  1 mg Oral Daily    HYDROmorphone (DILAUDID) injection  1 mg Intravenous Q4H   influenza vac split quadrivalent PF  0.5 mL Intramuscular Tomorrow-1000   lidocaine (PF)  6 mL Intradermal Once   pantoprazole  (PROTONIX) IV  40 mg Intravenous Q24H   potassium chloride SA  60 mEq Oral Daily   sodium chloride flush  10 mL Intrapleural Q8H   Continuous Infusions:  ceFEPime (MAXIPIME) IV 2 g (06/11/22 0617)   vancomycin 750 mg (06/11/22 0107)      Patient profile:   36 yo female with pmh of Liddle's syndrome, chronic loose stool, recurrent acute pancreatitis ( presumed Etoh). Seen in consultation by [place] on 11/4. She had been transferred here from [facility]  for further management of acute pancreatitis complicated by possible developing pseudocyst (vrs area of necrosis) , acute SV thrombosis and splenic hematoma ( after starting Eliquis for SV thrombosis).   Impression/Plan:    Acute pancreatitis secondary to ETOH complicated by developing pseudocyst vrs area of necrosis  -BUN slightly elevated from 13-21, creatinine 0.34-0.55, currently on sodium chloride infusion 10 mL/h-monitor closely -Pain control per the inpatient medical team. -Encourage early ambulation -Currently n.p.o. per general surgery recommendations. -Consideration of nutritional support with a nasogastric or nasojejunal postpyloric feedings, nutritional consult. -Repeat CT scan if concern for necrotizing pancreatitis, pseudocyst, or abscess -Smoking and alcohol cessation counseling.  Acute SV thrombosis and splenic hematoma ( after starting Eliquis) IR has seen the patient states splenic vein completely occluded no role for recanalization at this time.  Discussed possible splenic embolization or recurrent bleeding and is not a surgical candidate for splenectomy Surgery is following along, note from this morning states no surgical intervention needed at this time. Received Kcentra.  Hgb stable at 10.4 to 9.8 to 9.2 Anti-coagulation on hold.   Acute respiratory failure secondary to pleural effusion .  S/p  500 ml thoracentesis 11/04( Exudative).  Gram stain negative for organisms. Culture pending Status post left chest tube  11/05, currently on cefepime and vancomycin On 2 L nasal cannula  Chronic loose stool.  No evidence for microscopic colitis or celiac disease on workup in 2022. Possibly EPI?  Once patient's p.o. then can consider trial of Creon versus rechecking fecal elastase outpatient  Last fecal elastase in 2021 was normal  Principal Problem:   Acute pancreatitis Active Problems:   Essential hypertension   Diarrhea   Splenic vein thrombosis   Liddle's syndrome   Spleen hematoma   Acute hypoxic respiratory failure (HCC)   Exudative pleural effusion   HCAP (healthcare-associated pneumonia)    LOS: 10 days   Vladimir Crofts  06/11/2022, 10:33 AM  GI ATTENDING  Interval history data reviewed.  Patient seen and examined.  Case discussed with GI colleagues.  Progress note as outlined.  General surgery monitoring splenic hematoma.  Pulmonary/critical care managing respiratory failure due to large pleural effusion status post chest tube placement.  With regards to pancreatitis, stable with small to moderate organizing fluid collection.  Agree with ongoing supportive care as you are doing.  No specific additional recommendations from GI medicine at this point.  Docia Chuck. Geri Seminole., M.D. [facility] Division of Gastroenterology

## 2022-06-11 NOTE — Progress Notes (Signed)
Initial Nutrition Assessment  DOCUMENTATION CODES:   Non-severe (moderate) malnutrition in context of acute illness/injury  INTERVENTION:  Encourage adequate PO intake Ensure Enlive po BID, each supplement provides 350 kcal and 20 grams of protein. Monitor for diet advancement, if unable to tolerate adequate PO intake would recommend placement of Cortrak for supplemental nutrition  NUTRITION DIAGNOSIS:   Moderate Malnutrition related to acute illness (acute pancreatitis) as evidenced by energy intake < 75% for > 7 days, percent weight loss.  GOAL:   Patient will meet greater than or equal to 90% of their needs  MONITOR:   PO intake, Supplement acceptance, Labs, Weight trends, Diet advancement  REASON FOR ASSESSMENT:   Other (Comment) (Cortrak list)    ASSESSMENT:   Pt admitted with abdominal pain r/t acute pancreatitis. PMH significant for prior EtOH use, HTN, liddle syndrome, B12/folate deficiency, recently admitted with acute alcoholic pancreatitis and found to have splenic vein thrombosis.  S/p CT scan on 11/3 showing L sided pleural effusion, L sided atelectasis and small amount of ascites. Thoracentesis on 11/4 yielding 549m 11/5 chest tube placed d/t hydrothorax.   Pt followed by GI and general surgery. No n/v. No BM but passing gas. No surgical interventions planned for SV thrombosis and splenic hematoma at this time.  2 days PTA she noticed increased frequency of loose stools with any PO. GI considering adding creon once taking PO again.   Pt has been primarily alternating between clear liquid and full liquids during admission (~11 days) which has lead her to have very minimal PO intake. She expresses feeling hungry and wanting to trial PO intake prior to considering a Cortrak for nutrition. Discussed with GI. They agree to start full liquids and advance diet as tolerated. RD placed order for full liquids and provided pudding for patient. She is also agreeable to  nutrition supplements to optimize PO intake.    Pt is a sIT consultantfor an ILF. She was eating well up until her pancreatitis flare which she states started at 3am the night she was admitted. She ate steak and crablegs for dinner that night.    She reports a weight of about 230 lbs around time of her first pancreatitis flare about 1.5 years ago. She suspects she has lost weight but is uncertain how much. Reviewed weight history. She appears to have had a 5.3% weight loss within the last month since prior admission which is clinically significant for time frame.   Medications: folvite, klor-con, IV abx  Labs: sodium 134, BUN 21, AST 12  L chest tube: 12938moutput x12 hours  NUTRITION - FOCUSED PHYSICAL EXAM:  Flowsheet Row Most Recent Value  Orbital Region No depletion  Upper Arm Region No depletion  Thoracic and Lumbar Region No depletion  Buccal Region No depletion  Temple Region No depletion  Clavicle Bone Region No depletion  Clavicle and Acromion Bone Region No depletion  Scapular Bone Region No depletion  Dorsal Hand No depletion  Patellar Region Mild depletion  Anterior Thigh Region Mild depletion  Posterior Calf Region Mild depletion  Edema (RD Assessment) None  Hair Reviewed  Eyes Reviewed  Mouth Reviewed  Skin Reviewed  Nails Reviewed       Diet Order:   Diet Order             Diet full liquid Room service appropriate? Yes with Assist; Fluid consistency: Thin  Diet effective now  EDUCATION NEEDS:   Education needs have been addressed  Skin:  Skin Assessment: Reviewed RN Assessment  Last BM:  10/30  Height:   Ht Readings from Last 1 Encounters:  06/08/22 '5\' 3"'$  (1.6 m)    Weight:   Wt Readings from Last 1 Encounters:  06/08/22 81.7 kg   BMI:  Body mass index is 31.91 kg/m.  Estimated Nutritional Needs:   Kcal:  1600-1800  Protein:  75-90g  Fluid:  >/=1.5L  Clayborne Dana, RDN, LDN Clinical Nutrition

## 2022-06-11 NOTE — Progress Notes (Signed)
Subjective/Chief Complaint: Feels better, having some bowel function   Objective: Vital signs in last 24 hours: Temp:  [97.9 F (36.6 C)-98.5 F (36.9 C)] 97.9 F (36.6 C) (11/06 0423) Pulse Rate:  [99-123] 99 (11/06 0423) Resp:  [18-20] 19 (11/06 0423) BP: (121-144)/(76-106) 124/83 (11/06 0423) SpO2:  [93 %-100 %] 97 % (11/06 0423) Last BM Date : 06/04/22  Intake/Output from previous day: 11/05 0701 - 11/06 0700 In: 426.7 [I.V.:6; IV Piggyback:400.7] Out: 1640 [Urine:350; Chest Tube:1290] Intake/Output this shift: No intake/output data recorded.  Abdominal nontender today, mild distended  Lab Results:  Recent Labs    06/10/22 0539 06/10/22 0911 06/11/22 0433 06/11/22 0916  WBC 21.1*  --  16.9*  --   HGB 9.8*   < > 9.2* 9.2*  HCT 27.3*   < > 26.0* 25.8*  PLT 482*  --  514*  --    < > = values in this interval not displayed.   BMET Recent Labs    06/10/22 0539 06/11/22 0433  NA 134* 134*  K 4.1 3.6  CL 97* 96*  CO2 27 27  GLUCOSE 136* 111*  BUN 13 21*  CREATININE 0.34* 0.55  CALCIUM 8.5* 8.5*   PT/INR No results for input(s): "LABPROT", "INR" in the last 72 hours. ABG Recent Labs    06/10/22 0830  PHART 7.45  HCO3 34.1*    Studies/Results: DG CHEST PORT 1 VIEW  Result Date: 06/11/2022 CLINICAL DATA:  Left-sided chest tube. EXAM: PORTABLE CHEST 1 VIEW COMPARISON:  June 10, 2022. FINDINGS: Stable cardiomegaly. Stable left-sided chest tube is noted no definite pneumothorax is noted currently. Left pleural effusion and left basilar atelectasis is again noted. IMPRESSION: Stable position of left-sided chest tube with possible kink within it. No definite pneumothorax is noted. Stable left basilar opacity. Electronically Signed   By: Marijo Conception M.D.   On: 06/11/2022 08:16   DG Chest Port 1 View  Result Date: 06/10/2022 CLINICAL DATA:  Left chest tube placement EXAM: PORTABLE CHEST 1 VIEW COMPARISON:  Previous studies including the examination  done earlier today FINDINGS: Transverse diameter of heart is increased. There is significant interval decrease in left pleural effusion after placement of left chest tube. There is a kink in the distal course of the left chest tube approximately 7 cm from the tip. There is possible trace amount of pneumothorax in the lateral aspect of left upper lung field. There is possible tiny left apical pneumothorax. Residual increased density in left lower lung fields suggest possible underlying atelectasis/pneumonia. There is increase in patchy infiltrate in right upper lung fields. IMPRESSION: There is marked interval decrease in left pleural effusion after placement of left chest tube. Residual increased density in left lower lung fields may be due to pleural effusion and underlying atelectasis/pneumonia. A kink is noted in the distal course of left chest tube. Possible tiny pneumothorax is noted in the left apex and lateral aspect of left upper lung field. There is interval increase in patchy infiltrate in right upper lung field suggesting worsening of pneumonia. Electronically Signed   By: Elmer Picker M.D.   On: 06/10/2022 11:57   DG Abd 1 View  Result Date: 06/10/2022 CLINICAL DATA:  Abdominal pain and distension EXAM: ABDOMEN - 1 VIEW COMPARISON:  None Available. FINDINGS: Scattered large and small bowel gas is noted. No free air is seen. Increasing left-sided effusion is noted when compare with the prior chest exam. No acute bony abnormality is seen. IMPRESSION: Unremarkable abdomen.  Increasing left-sided pleural effusion. Electronically Signed   By: Inez Catalina M.D.   On: 06/10/2022 09:56   DG CHEST PORT 1 VIEW  Result Date: 06/10/2022 CLINICAL DATA:  Shortness of breath EXAM: PORTABLE CHEST 1 VIEW COMPARISON:  06/09/2022 FINDINGS: Cardiac shadow is enlarged but stable. Increasing left-sided pleural effusion is noted when compare with the prior exam. Persistent and slightly increased airspace opacity  in the right apex is noted. No bony abnormality is noted. IMPRESSION: Increasing left-sided effusion compared with the prior exam. Increasing right apical airspace opacity. Electronically Signed   By: Inez Catalina M.D.   On: 06/10/2022 09:55   DG CHEST PORT 1 VIEW  Result Date: 06/09/2022 CLINICAL DATA:  Post left-sided thoracentesis. EXAM: PORTABLE CHEST 1 VIEW COMPARISON:  CT abdomen and pelvis-06/08/2022; chest radiograph-06/01/2022 FINDINGS: Moderate-sized potentially partially loculated residual left-sided pleural effusion post thoracentesis. No pneumothorax. There is obscuration of the left heart border secondary to left-sided effusion associated left mid and lower lung heterogeneous/consolidative opacities. Trace right-sided pleural effusion is not excluded. Pulmonary vasculature is indistinct with cephalization of flow. No acute osseous abnormalities. IMPRESSION: 1. Moderate-sized potentially partially loculated residual left-sided effusion post thoracentesis. No pneumothorax. 2. Left mid and lower lung heterogeneous/consolidative opacities, potentially atelectasis though worrisome for multifocal infection. 3. Suspected trace right-sided effusion with suspected mild pulmonary edema. Electronically Signed   By: Sandi Mariscal M.D.   On: 06/09/2022 11:13   US THORACENTESIS ASP PLEURAL SPACE W/IMG GUIDE  Result Date: 06/09/2022 INDICATION: Left pleural effusion, shortness of breath EXAM: ULTRASOUND GUIDED LEFT THORACENTESIS MEDICATIONS: None. COMPLICATIONS: None immediate. PROCEDURE: An ultrasound guided thoracentesis was thoroughly discussed with the patient and questions answered. The benefits, risks, alternatives and complications were also discussed. The patient understands and wishes to proceed with the procedure. Written consent was obtained. Ultrasound was performed to localize and mark an adequate pocket of fluid in the left chest. The area was then prepped and draped in the normal sterile fashion.  1% Lidocaine was used for local anesthesia. Under ultrasound guidance a 6 Fr Safe-T-Centesis catheter was introduced. Thoracentesis was performed. The catheter was removed and a dressing applied. FINDINGS: A total of approximately 500 cc of pleural fluid was removed. Samples were sent to the laboratory as requested by the clinical team. IMPRESSION: Successful ultrasound guided left thoracentesis yielding 500 cc of pleural fluid. Performed and dictated by Pasty Spillers, PA-C Electronically Signed   By: Sandi Mariscal M.D.   On: 06/09/2022 11:09    Anti-infectives: Anti-infectives (From admission, onward)    Start     Dose/Rate Route Frequency Ordered Stop   06/11/22 0000  vancomycin (VANCOREADY) IVPB 750 mg/150 mL        750 mg 150 mL/hr over 60 Minutes Intravenous Every 12 hours 06/10/22 1131     06/10/22 1400  ceFEPIme (MAXIPIME) 2 g in sodium chloride 0.9 % 100 mL IVPB        2 g 200 mL/hr over 30 Minutes Intravenous Every 8 hours 06/10/22 1131     06/10/22 1230  vancomycin (VANCOREADY) IVPB 1500 mg/300 mL        1,500 mg 150 mL/hr over 120 Minutes Intravenous  Once 06/10/22 1131 06/10/22 1503       Assessment/Plan: HD#3 (following transfer from [facility]) Acute alcoholic pancreatitis Splenic vein thrombosis Splenic hematoma Pseudocyst formation Left pleural effusion   Serial Hgb determinations - remaining ~stable at 9.8 this AM No surgical intervention needed  Rolm Bookbinder 06/11/2022

## 2022-06-11 NOTE — Progress Notes (Signed)
Physical Therapy Evaluation Patient Details Name: Jillian Shepherd MRN: [id number] DOB: 06-01-1986 Today's Date: 06/11/2022  History of Present Illness  36 year old Caucasian female admitted 10/26 with acute alcoholic pancreatitis and splenic hematoma.  SIRS. Chest tube placed as well 11/5.   PMH: alcohol use was admitted earlier to the hospital this month for acute alcoholic pancreatitis.  Clinical Impression  Pt admitted with above diagnosis. Pt was able to ambulate without device with min guard to supervision doing laps in room (no O2 tank carrier on unit).   Pt desaturates without O2- see note below.  Pt should progress well. Pt currently with functional limitations due to the deficits listed below (see PT Problem List). Pt will benefit from skilled PT to increase their independence and safety with mobility to allow discharge to the venue listed below.      SATURATION QUALIFICATIONS: (This note is used to comply with regulatory documentation for home oxygen)  Patient Saturations on Room Air at Rest = 85%  Patient Saturations on Room Air while Ambulating = Did not take off O2 as desaturates at rest.   Patient Saturations on 3 Liters of oxygen while Ambulating = 88%      Recommendations for follow up therapy are one component of a multi-disciplinary discharge planning process, led by the attending physician.  Recommendations may be updated based on patient status, additional functional criteria and insurance authorization.  Follow Up Recommendations Outpatient PT      Assistance Recommended at Discharge Set up Supervision/Assistance  Patient can return home with the following  A little help with walking and/or transfers;Assistance with cooking/housework;Help with stairs or ramp for entrance;Assist for transportation    Equipment Recommendations None recommended by PT  Recommendations for Other Services       Functional Status Assessment Patient has had a recent decline in their  functional status and demonstrates the ability to make significant improvements in function in a reasonable and predictable amount of time.     Precautions / Restrictions Precautions Precaution Comments: Chest tube, NPO - tube feeding Restrictions Weight Bearing Restrictions: No      Mobility  Bed Mobility Overal bed mobility: Modified Independent             General bed mobility comments: utilized bed railings to transition to sitting on EOB.    Transfers Overall transfer level: Needs assistance Equipment used: None Transfers: Sit to/from Stand, Bed to chair/wheelchair/BSC Sit to Stand: Supervision   Step pivot transfers: Min guard       General transfer comment: 2nd person utilized for line management.    Ambulation/Gait Ambulation/Gait assistance: Supervision, Min guard Gait Distance (Feet): 70 Feet (Pt did laps in room as no O2 tank on unit) Assistive device: None Gait Pattern/deviations: Step-through pattern, Decreased stride length   Gait velocity interpretation: <1.31 ft/sec, indicative of household ambulator   General Gait Details: Pt with good balance overall.  HR incr with activity to 132 bpm and needed cues for pursed lip breathing as pt with DOE 3/4 and desaturation to mid 80's on RA and with O2 with activity.  Stairs            Wheelchair Mobility    Modified Rankin (Stroke Patients Only)       Balance Overall balance assessment: No apparent balance deficits (not formally assessed)  Pertinent Vitals/Pain Pain Assessment Pain Assessment: Faces Faces Pain Scale: Hurts even more Breathing: normal Negative Vocalization: none Facial Expression: smiling or inexpressive Body Language: relaxed Consolability: no need to console PAINAD Score: 0 Pain Location: left chest tube site Pain Descriptors / Indicators: Grimacing, Discomfort Pain Intervention(s): Limited activity within  patient's tolerance, Monitored during session, Repositioned    Home Living Family/patient expects to be discharged to:: Private residence Living Arrangements: Spouse/significant other Available Help at Discharge: Family;Available 24 hours/day (Husband and Son (14/yo).) Type of Home: Mobile home Home Access: Stairs to enter Entrance Stairs-Rails: Right;Left;Can reach both Entrance Stairs-Number of Steps: 2   Home Layout: One level Home Equipment: None Additional Comments: [facility] full time I living facillity in [place]    Prior Function Prior Level of Function : Driving;Working/employed;Independent/Modified Independent             Mobility Comments: I PTA ADLs Comments: I PTA     Hand Dominance   Dominant Hand: Right    Extremity/Trunk Assessment   Upper Extremity Assessment Upper Extremity Assessment: Defer to OT evaluation    Lower Extremity Assessment Lower Extremity Assessment: Generalized weakness    Cervical / Trunk Assessment Cervical / Trunk Assessment: Normal  Communication   Communication: No difficulties  Cognition Arousal/Alertness: Awake/alert Behavior During Therapy: WFL for tasks assessed/performed Overall Cognitive Status: Within Functional Limits for tasks assessed                                          General Comments General comments (skin integrity, edema, etc.): VS monitored during session. At start of session with pt in bed: 104 bpm, 117/88. Pt on 1L O2 resting in bed with SpO2 reading in 90's. Once out of bed and completing functional mobility from bed to toilet, oxygen flow was increased to 3L due to SpO2 dropping to 85. HR increased to 130 when up using toilet. Pt completed functional mobility in hallway with supplemental oxygen removed. SpO2 at 92 then dropped to 85. Pt seated in recliner with oxygen on at 1L although SpO2 remained in upper 80's. Oxygen flow increased to 2L with SpO2 increasing back  to 90's. When seated in recliner, HR decreased to 120 and slowly decreased to 115. Pt reports no feeling of heart racing    Exercises     Assessment/Plan    PT Assessment Patient needs continued PT services  PT Problem List Decreased activity tolerance;Decreased balance;Decreased mobility;Decreased knowledge of use of DME;Decreased safety awareness;Decreased knowledge of precautions;Cardiopulmonary status limiting activity       PT Treatment Interventions DME instruction;Gait training;Functional mobility training;Therapeutic activities;Therapeutic exercise;Balance training;Patient/family education;Stair training    PT Goals (Current goals can be found in the Care Plan section)  Acute Rehab PT Goals Patient Stated Goal: to gohome PT Goal Formulation: With patient Time For Goal Achievement: 06/25/22 Potential to Achieve Goals: Good    Frequency Min 3X/week     Co-evaluation PT/OT/SLP Co-Evaluation/Treatment: Yes Reason for Co-Treatment: Complexity of the patient's impairments (multi-system involvement);For patient/therapist safety PT goals addressed during session: Mobility/safety with mobility OT goals addressed during session: ADL's and self-care;Strengthening/ROM       AM-PAC PT "6 Clicks" Mobility  Outcome Measure Help needed turning from your back to your side while in a flat bed without using bedrails?: None Help needed moving from lying on your back to sitting on the side of a flat  bed without using bedrails?: A Little Help needed moving to and from a bed to a chair (including a wheelchair)?: A Little Help needed standing up from a chair using your arms (e.g., wheelchair or bedside chair)?: A Little Help needed to walk in hospital room?: A Little Help needed climbing 3-5 steps with a railing? : A Lot 6 Click Score: 18    End of Session Equipment Utilized During Treatment: Gait belt;Oxygen Activity Tolerance: Patient limited by fatigue Patient left: in chair;with  call bell/phone within reach;with chair alarm set Nurse Communication: Mobility status PT Visit Diagnosis: Muscle weakness (generalized) (M62.81)    Time: 0488-8916 PT Time Calculation (min) (ACUTE ONLY): 34 min   Charges:   PT Evaluation $PT Eval Moderate Complexity: 1 Mod          Emalie Mcwethy M,PT Acute Rehab Services [phone]   Alvira Philips 06/11/2022, 11:21 AM

## 2022-06-11 NOTE — Progress Notes (Signed)
SATURATION QUALIFICATIONS: (This note is used to comply with regulatory documentation for home oxygen)  Patient Saturations on Room Air at Rest = 85%  Patient Saturations on Room Air while Ambulating = Did not take off O2 as desaturates at rest.   Patient Saturations on 3 Liters of oxygen while Ambulating = 88%  Please briefly explain why patient needs home oxygen:Pt desaturates on RA at rest and required 3LO2 to maintain sats above 88% with ambulation. Encouraged pursed lip breathing as well as incentive spirometer.  Jillian Shepherd M,PT Acute Rehab Services [phone]

## 2022-06-12 ENCOUNTER — Inpatient Hospital Stay (HOSPITAL_COMMUNITY): Payer: 59

## 2022-06-12 DIAGNOSIS — J189 Pneumonia, unspecified organism: Secondary | ICD-10-CM | POA: Diagnosis not present

## 2022-06-12 DIAGNOSIS — J90 Pleural effusion, not elsewhere classified: Secondary | ICD-10-CM | POA: Diagnosis not present

## 2022-06-12 DIAGNOSIS — E440 Moderate protein-calorie malnutrition: Secondary | ICD-10-CM | POA: Insufficient documentation

## 2022-06-12 DIAGNOSIS — J9601 Acute respiratory failure with hypoxia: Secondary | ICD-10-CM | POA: Diagnosis not present

## 2022-06-12 DIAGNOSIS — K852 Alcohol induced acute pancreatitis without necrosis or infection: Secondary | ICD-10-CM | POA: Diagnosis not present

## 2022-06-12 LAB — TYPE AND SCREEN
ABO/RH(D): O POS
Antibody Screen: NEGATIVE
Unit division: 0
Unit division: 0

## 2022-06-12 LAB — BPAM RBC
Blood Product Expiration Date: 202312062359
Blood Product Expiration Date: 202312072359
Unit Type and Rh: 5100
Unit Type and Rh: 5100

## 2022-06-12 LAB — MAGNESIUM: Magnesium: 1.8 mg/dL (ref 1.7–2.4)

## 2022-06-12 LAB — COMPREHENSIVE METABOLIC PANEL
ALT: 6 U/L (ref 0–44)
AST: 14 U/L — ABNORMAL LOW (ref 15–41)
Albumin: 1.7 g/dL — ABNORMAL LOW (ref 3.5–5.0)
Alkaline Phosphatase: 88 U/L (ref 38–126)
Anion gap: 9 (ref 5–15)
BUN: 12 mg/dL (ref 6–20)
CO2: 27 mmol/L (ref 22–32)
Calcium: 8.3 mg/dL — ABNORMAL LOW (ref 8.9–10.3)
Chloride: 97 mmol/L — ABNORMAL LOW (ref 98–111)
Creatinine, Ser: 0.4 mg/dL — ABNORMAL LOW (ref 0.44–1.00)
GFR, Estimated: >60 mL/min (ref >60)
Glucose, Bld: 112 mg/dL — ABNORMAL HIGH (ref 70–99)
Potassium: 3.4 mmol/L — ABNORMAL LOW (ref 3.5–5.1)
Sodium: 133 mmol/L — ABNORMAL LOW (ref 135–145)
Total Bilirubin: 1.2 mg/dL (ref 0.3–1.2)
Total Protein: 6.5 g/dL (ref 6.5–8.1)

## 2022-06-12 LAB — CBC WITH DIFFERENTIAL/PLATELET
Abs Immature Granulocytes: 0.14 10*3/uL — ABNORMAL HIGH (ref 0.00–0.07)
Basophils Absolute: 0.1 10*3/uL (ref 0.0–0.1)
Basophils Relative: 0 %
Eosinophils Absolute: 0.1 10*3/uL (ref 0.0–0.5)
Eosinophils Relative: 1 %
HCT: 24 % — ABNORMAL LOW (ref 36.0–46.0)
Hemoglobin: 8.5 g/dL — ABNORMAL LOW (ref 12.0–15.0)
Immature Granulocytes: 1 %
Lymphocytes Relative: 11 %
Lymphs Abs: 2.1 10*3/uL (ref 0.7–4.0)
MCH: 35.4 pg — ABNORMAL HIGH (ref 26.0–34.0)
MCHC: 35.4 g/dL (ref 30.0–36.0)
MCV: 100 fL (ref 80.0–100.0)
Monocytes Absolute: 1.2 10*3/uL — ABNORMAL HIGH (ref 0.1–1.0)
Monocytes Relative: 7 %
Neutro Abs: 14.6 10*3/uL — ABNORMAL HIGH (ref 1.7–7.7)
Neutrophils Relative %: 80 %
Platelets: 506 10*3/uL — ABNORMAL HIGH (ref 150–400)
RBC: 2.4 MIL/uL — ABNORMAL LOW (ref 3.87–5.11)
RDW: 17.5 % — ABNORMAL HIGH (ref 11.5–15.5)
WBC: 18.2 10*3/uL — ABNORMAL HIGH (ref 4.0–10.5)
nRBC: 0 % (ref 0.0–0.2)

## 2022-06-12 LAB — HEMOGLOBIN AND HEMATOCRIT, BLOOD
HCT: 25.7 % — ABNORMAL LOW (ref 36.0–46.0)
HCT: 27.2 % — ABNORMAL LOW (ref 36.0–46.0)
HCT: 29.7 % — ABNORMAL LOW (ref 36.0–46.0)
Hemoglobin: 9.1 g/dL — ABNORMAL LOW (ref 12.0–15.0)
Hemoglobin: 9.6 g/dL — ABNORMAL LOW (ref 12.0–15.0)
Hemoglobin: 10.4 g/dL — ABNORMAL LOW (ref 12.0–15.0)

## 2022-06-12 LAB — PHOSPHORUS: Phosphorus: 3.2 mg/dL (ref 2.5–4.6)

## 2022-06-12 MED ORDER — STERILE WATER FOR INJECTION IJ SOLN
5.0000 mg | Freq: Once | RESPIRATORY_TRACT | Status: AC
  Administered 2022-06-12: 5 mg via INTRAPLEURAL
  Filled 2022-06-12: qty 5

## 2022-06-12 MED ORDER — SODIUM CHLORIDE 0.9% FLUSH
10.0000 mL | Freq: Three times a day (TID) | INTRAVENOUS | Status: DC
  Administered 2022-06-12: 10 mL via INTRAPLEURAL

## 2022-06-12 MED ORDER — POLYETHYLENE GLYCOL 3350 17 G PO PACK
17.0000 g | PACK | Freq: Every day | ORAL | Status: DC
  Administered 2022-06-12 – 2022-06-13 (×2): 17 g via ORAL
  Filled 2022-06-12 (×2): qty 1

## 2022-06-12 MED ORDER — SODIUM CHLORIDE (PF) 0.9 % IJ SOLN
10.0000 mg | Freq: Once | INTRAMUSCULAR | Status: AC
  Administered 2022-06-12: 10 mg via INTRAPLEURAL
  Filled 2022-06-12: qty 10

## 2022-06-12 MED ORDER — SODIUM CHLORIDE 0.9% FLUSH: 10.0000 mL | Freq: Three times a day (TID) | INTRAVENOUS | Status: DC

## 2022-06-12 NOTE — Plan of Care (Signed)

## 2022-06-12 NOTE — Progress Notes (Signed)
Mobility Specialist - Progress Note   06/12/22 1029  Mobility  Activity Ambulated with assistance in hallway  Level of Assistance Standby assist, set-up cues, supervision of patient - no hands on  Assistive Device Other (Comment) (IV pole)  Distance Ambulated (ft) 350 ft  Activity Response Tolerated well  Mobility Referral Yes  $Mobility charge 1 Mobility   Pt received in bed and agreeable. Pt c/o pain at chest tube site during ambulation. Pt was returned to bed with all needs met.   Larey Seat

## 2022-06-12 NOTE — Progress Notes (Signed)
NAME:  Jillian Shepherd, MRN:  [id number], DOB:  05/24/1986, LOS: 57 ADMISSION DATE:  05/31/2022, CONSULTATION DATE:  11/5 REFERRING MD:  Alfredia Ferguson, CHIEF COMPLAINT:  acute hypoxic resp failure  History of Present Illness:  36 year old female patient critical care asked to evaluate acutely on 11/5 for acute worsening of hypoxic respiratory failure.  She was initially hospitalized for acute pancreatitis earlier in October 2023, then subsequently readmitted on 10/26 with worsening abdominal pain.  She was found to have splenic thrombosis, and was placed on anticoagulation.  She been treated medically, over the course of her hospitalization she has had worsening abdominal pain, developed splenic hematoma, as well as progression of pancreatic pseudocyst.  She required reversal of anticoagulation with Kcentra, and since her hemoglobin is stabilized.  She has been followed by general surgery as well as gastroenterology.  However more frequently over the last couple days she has had progressive respiratory failure requiring escalating supplemental oxygen needs.  A CT scan was done on 11/3 that showed a moderate left-sided pleural effusion fairly significant left-sided atelectasis and small amount of abdominal ascites.  She underwent a therapeutic and diagnostic thoracentesis on 11/4 with fluid was exudative by lights criteria.  Over the course of the evening her oxygen requirements have continued to improve she is now on 100% FiO2 as well as heated high flow, portable chest x-ray shows reaccumulation of left-sided pleural effusion that appears loculated, critical care asked to evaluate for progressive respiratory failure.  Pertinent  Medical History   Alcohol abuse, acute alcoholic pancreatitis, splenic vein thrombosis, Significant Hospital Events: Including procedures, antibiotic start and stop dates in addition to other pertinent events   10/28 admitted for worsening abd pain w/ splenic vein thrombosis as well  as worsening pancreatitis and pseudocyst. IVFs started. Started on [facility].  11/1-11/3 worsening pain. CT of the abdomen pelvis with IV contrast was ordered stat which showed presence of new perisplenic hematoma without active extravasation and a 4.4 cm collection in the lateral wall of the gastric body consistent with possible pseudocyst. Got K centra. AC stopped 11/4 incr'd O2 needs. Thora 500 ml, exudate by lights criteria 11/5 marked increase in O2 needs. High flow and FM. CXR w/ marked inc Left sided effusion. Pt placed on HFNC. Left chest tube placed abx started for HCAP coverage (vanc and cefepime)   Interim History / Subjective:   Complains of discomfort with chest tube  Objective   Blood pressure 137/87, pulse (!) 101, temperature 98.8 F (37.1 C), temperature source Oral, resp. rate (!) 21, height '5\' 3"'$  (1.6 m), weight 81.7 kg, last menstrual period 05/01/2022, SpO2 92 %.        Intake/Output Summary (Last 24 hours) at 06/12/2022 0919 Last data filed at 06/12/2022 0600 Gross per 24 hour  Intake 630 ml  Output 760 ml  Net -130 ml   Filed Weights   05/31/22 0824 06/08/22 1454  Weight: 83.6 kg 81.7 kg    Examination: General: Young woman sitting in a chair, no distress HENT: NCAT no JVD MMM Lungs: Decreased breath sounds left base, no accessory muscle use Cardiovascular: tachy rrr no MRG  Abdomen: Soft, nontender Extremities: warm and dry trace edema  Neuro: awake and oriented    Chest x-ray with no significant change  Resolved Hospital Problem list     Assessment & Plan:  Acute hypoxic respiratory failure 2/2 large left pleural effusion w/ atelectasis vs infiltrate.  -effusion exudate by lights, neutrophilic  Plan  Chest tube drained 250 cc  over 24 hours Continue to suction until drainage is less than 100 cc per 24 Empiric antimicrobial therapy Monitor culture data    Acute on chronic alcoholic pancreatitis w/ pseudocyst.  Followed by general surg. 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 Plan Per primary    Best Practice (right click and "Reselect all SmartList Selections" daily)   Diet/type: NPO DVT prophylaxis: SCD GI prophylaxis: N/A Lines: N/A Foley:  N/A Code Status:  full code Last date of multidisciplinary goals of care discussion [per primary ]  Labs   CBC: Recent Labs  Lab 06/08/22 0438 06/08/22 1354 06/09/22 0145 06/09/22 0558 06/10/22 0539 06/10/22 0911 06/11/22 0433 06/11/22 0916 06/11/22 1608 06/11/22 2135 06/12/22 0541  WBC 22.6*  --  25.7*  --  21.1*  --  16.9*  --   --   --  18.2*  NEUTROABS  --   --   --   --  17.5*  --  13.4*  --   --   --  14.6*  HGB 13.3   < > 11.2*   < > 9.8*   < > 9.2* 9.2* 8.8* 8.9* 8.5*  HCT 38.6   < > 31.4*   < > 27.3*   < > 26.0* 25.8* 26.2* 24.9* 24.0*  MCV 102.9*  --  99.7  --  100.0  --  100.4*  --   --   --  100.0  PLT 503*  --  477*  --  482*  --  514*  --   --   --  506*   < > = values in this interval not displayed.    Basic Metabolic Panel: Recent Labs  Lab 06/09/22 0145 06/09/22 0558 06/10/22 0539 06/11/22 0433 06/12/22 0541  NA 138 134* 134* 134* 133*  K 4.4 4.4 4.1 3.6 3.4*  CL 96* 97* 97* 96* 97*  CO2 '27 27 27 27 27  '$ GLUCOSE 144* 133* 136* 111* 112*  BUN '8 10 13 '$ 21* 12  CREATININE 0.51 0.47 0.34* 0.55 0.40*  CALCIUM 9.1 8.4* 8.5* 8.5* 8.3*  MG 1.4* 1.4* 2.1 2.1 1.8  PHOS 4.3  --  3.3 3.9 3.2   GFR: Estimated Creatinine Clearance: 97.4 mL/min (A) (by C-G formula based on SCr of 0.4 mg/dL (L)). Recent Labs  Lab 06/08/22 0438 06/09/22 0145 06/09/22 0155 06/09/22 0558 06/10/22 0539 06/11/22 0433 06/12/22 0541  PROCALCITON 0.29 0.20  --   --  0.21   --   --   WBC 22.6* 25.7*  --   --  21.1* 16.9* 18.2*  LATICACIDVEN  --   --  1.0 0.8  --   --   --     Liver Function Tests: Recent Labs  Lab 06/09/22 0145 06/09/22 0558 06/10/22 0539 06/11/22 0433 06/12/22 0541  AST 13* 14* 12* 12* 14*  ALT '8 8 8 8 6  '$ ALKPHOS 112 115 100 88 88  BILITOT 0.8 0.7 0.7 1.2 1.2  PROT 6.4* 6.3* 6.4* 6.4* 6.5  ALBUMIN 2.1* 2.0* 2.0* 1.8* 1.7*   Recent Labs  Lab 06/09/22 0145 06/10/22 0539  LIPASE 30 25   No results for input(s): "AMMONIA" in the last 168 hours.  ABG  Component Value Date/Time   PHART 7.45 06/10/2022 0830   PCO2ART 49 (H) 06/10/2022 0830   PO2ART 139 (H) 06/10/2022 0830   HCO3 34.1 (H) 06/10/2022 0830   O2SAT 100 06/10/2022 0830     Coagulation Profile: No results for input(s): "INR", "PROTIME" in the last 168 hours.  Cardiac Enzymes: No results for input(s): "CKTOTAL", "CKMB", "CKMBINDEX", "TROPONINI" in the last 168 hours.  HbA1C: Hgb A1c MFr Bld  Date/Time Value Ref Range Status  05/02/2022 09:03 AM 5.3 <5.7 % of total Hgb Final    Comment:    For the purpose of screening for the presence of diabetes: . <5.7%       Consistent with the absence of diabetes 5.7-6.4%    Consistent with increased risk for diabetes             (prediabetes) > or =6.5%  Consistent with diabetes . This assay result is consistent with a decreased risk of diabetes. . Currently, no consensus exists regarding use of hemoglobin A1c for diagnosis of diabetes in children. . According to American Diabetes Association (ADA) guidelines, hemoglobin A1c <7.0% represents optimal control in non-pregnant diabetic patients. Different metrics may apply to specific patient populations.  Standards of Medical Care in Diabetes(ADA). .     CBG: No results for input(s): "GLUCAP" in the last 168 hours.   Richardson Landry Brantlee Penn ACNP Acute Care Nurse Practitioner [place] Please consult Amion 06/12/2022, 9:20 AM

## 2022-06-12 NOTE — Procedures (Signed)
Pleural Fibrinolytic Administration Procedure Note  Jillian Shepherd  [id number]  Aug 03, 1986  Date: 06/12/22  Time: 2:40PM  Provider Performing: Lestine Mount   Procedure: Pleural Fibrinolysis Initial day [phone])  Indication(s): Fibrinolysis of complicated pleural effusion  Consent: Risks of the procedure as well as the alternatives and risks of each were explained to the patient and/or caregiver.  Consent for the procedure was obtained and is signed in the bedside chart.  Anesthesia: None  Time Out: Verified patient identification, verified procedure, site/side was marked, verified correct patient position, special equipment/implants available, medications/allergies/relevant history reviewed, required imaging and test results available.  Sterile Technique: Hand hygiene, gloves  Procedure Description: Existing pleural catheter was cleaned and accessed in sterile manner.  '10mg'$  of tPA in 30cc of saline and '5mg'$  of dornase in 30cc of sterile water were injected into pleural space using existing pleural catheter.  Catheter will be clamped for 1 hour (1540) and then placed back to suction.  Complications/Tolerance: None; patient tolerated the procedure well.  EBL: None  Specimen(s): None  Lestine Mount, [place] [facility] 06/12/22 2:54 PM  Please see [email or web address] for pager details.  From 7A-7P if no response, please call [phone] After hours, please call ELink [phone]

## 2022-06-12 NOTE — Progress Notes (Signed)
PROGRESS NOTE    Jillian Shepherd  NWG:[id number] DOB: 19-Mar-1986 DOA: 05/31/2022 PCP: [name], MD   Brief Narrative:  The patient is a obese 36 year old Caucasian female with a past medical history significant for but not limited to previous alcohol use was admitted earlier to the hospital this month for acute alcoholic pancreatitis.  She was then discharged but then subsequently readmitted to the hospital on 05/31/2022 with worsening abdominal pain.  CT showed persistent pancreatitis and splenic vein thrombosis.  She was started on IV fluid hydration and pain management and anticoagulation was given.  GI is following.  This splenic vein thrombosis was likely a sequelae of pancreatitis.  She does noted to have some clinical deterioration and worsening leukocytosis.  Given this GI recommended repeating CT imaging and she was noted to have a splenic hematoma as well as a new pancreatic pseudocyst.  Procalcitonin was negative but patient continued to have worsening leukocytosis and she was given Kcentra for the reversal of Eliquis and repeat hemoglobin/hematocrit showed stability and vital signs being stable but she is continuing to be tachycardic.  General surgery was consulted and they recommended transferring to [facility] for further evaluation.  She is currently getting IV fluid hydration still in general surgery as well as GI following this hospitalization at [facility] now.  Currently she remains n.p.o. at surgery's recommendation.    The patient acutely decompensated overnight and had to be placed on a nonrebreather and heated high flow nasal cannula.  Repeat chest x-ray was done and showed more fluid so pulmonary was consulted and they placed a chest tube with improvement of her symptoms.  She was also complained of some abdominal discomfort that is worsened.  She is at high risk for further decompensation.  GI recommended evaluation and discussion with IR and IR was consulted and they feel  that she is not a candidate for splenic vein thrombectomy but they could consider splenic embolization if she has recurrent bleeding is not a surgical candidate for splenectomy given the various potential outcomes of embolization as patient.  Critical care feels that she had a very large exudative pleural effusion and could have a parapneumonic effusion from a pneumonia or more likely exudative from her pancreatitis so they have initiated antibiotics for possible HAP.   06/11/22: Respiratory status is improving and pulmonary recommends out of bed to the chair and continuing pigtail to suction and discontinue when drainage is less than 200 mL.  She is drained 1.3 L in the last 24 hours.  Pulmonary recommends continuing empiric antibiotics for HAP.  She remains n.p.o. and gastroenterology recommending consideration for nutritional support with a NG or nasojejunal postpyloric feeding as well as nutritional consult.  GI also recommending once she has p.o. conservative trial of Creon.  Surgery feels that no surgical intervention is needed at this time and recommending continue monitor serial hemoglobin.  PT OT evaluated and she did desaturate on room air at rest to be placed on 2 L   06/12/2022.  She received tPA and dornase intrapleurally.  Diet has been advanced to full liquid diet and will continue this for now.  Surgery is signed off the case given that they feel that there is no role for surgical intervention at this time and recommending holding anticoagulation for at least 2 weeks and recommending a CT scan in 2 weeks before resuming anticoagulation  Assessment and Plan:  Acute on chronic pancreatitis-with worsening abdominal pain in the setting of splenic hematoma SIRS in  the setting of pancreatitis -Patient reports that she has abstained from alcohol since her last discharge -Worsening pain may be related to splenic vein thrombosis which is likely a sequelae of pancreatitis -Appreciate ongoingGI input  with plans to repeat CT abdomen and consider initiation of antibiotics pending results. -Repeat CT scan done and showed "Interval development of 500+mL mixed-attenuation perisplenic/subcapsular splenic hematoma, without evident active extravasation. New 4.4 cm pseudocyst abutting the lateral wall of the gastric body. New moderate left pleural effusion and consolidation/atelectasis in the visualized dependent aspect of the left lung. Some improvement in peripancreatic inflammatory change with stable peripancreatic pseudocysts. Small volume pelvic ascites. Incomplete opacification of the splenic vein consistent with previously described splenic vein thrombosis." -Noted to have some clinical deterioration with worsening leukocytosis -Hemoglobin/hematocrit went from 12.6/35.8 is slowly trending down and had been relatively stable but now trended down to 8.8/26.2 yesterday but is now trending back up and is now 9.6/27.2 on last check -IV fluid hydration is stopped and she is now on a diet -CRP was elevated at 24.6 and will repeat in the morning -Surgery recommending serial hemoglobin and pain control but they have no plans for acute operative intervention -WBC is trending down and went from 25.7 is now 16.9 yesterday but slightly bumped to 18.9 -GI will commending to continue maintenance fluids however this was stopped when the volume overload.  IR has been consulted as below.  GI recommending continuing supportive care and pain management she was n.p.o. but diet has been advanced -Pain control via PCA pump   Acute respiratory failure with hypoxia in the setting of large exudative pleural effusion and volume overload, improving -In the setting of above and she is requiring at least 4 L of nasal cannula to maintain O2 saturation greater 90% -Repeat CT scan was done on 11 3 which showed a moderate left-sided pleural effusion consolidation atelectasis as well as small volume pelvic ascites -Fluid hydration  has been reduced yesterday -Fluid sent for analysis and showed total nucleated cell count was 3053 with cloudy appearance and neutrophil count being 62; Gram stain and fungus culture pending but currently showed no organisms -SpO2: 95 % O2 Flow Rate (L/min): 2 L/min FiO2 (%): 100 % ABG    Component Value Date/Time   PHART 7.45 06/10/2022 0830   PCO2ART 49 (H) 06/10/2022 0830   PO2ART 139 (H) 06/10/2022 0830   HCO3 34.1 (H) 06/10/2022 0830   O2SAT 100 06/10/2022 0830   -The fluids have been stopped and she was given diuresis of IV Lasix 40 mils x1 and n.p.o. status is improved to full liquids -We will continue monitor patient's respiratory status carefully and will continue supplemental oxygen via nasal cannula wean O2 as tolerated -Continuous pulse oximetry maintain O2 saturation greater 90% -Prior to discharge she will need an ambulatory home O2 screen and we will repeat a chest x-ray in the morning  -Repeat chest x-ray the day before yesterday after thoracentesis showed "Moderate-sized potentially partially loculated residual left-sided effusion post thoracentesis. No pneumothorax. Left mid and lower lung heterogeneous/consolidative opacities, potentially atelectasis though worrisome for multifocal infection.  Suspected trace right-sided effusion with suspected mild pulmonary edema." -She was started on antibiotics by pulmonary and she was placed on vancomycin and cefepime but they are now discontinuing vancomycin given her MRSA is negative -Because of her worsening respiratory status pulmonary was consulted and they placed a chest tube and initiated antibiotics; now she ended up having fibrinolysis of the complicated pleural effusion and had 10 mg  of tPA and 5 mg of dornase injected into the pleural space using the existing pleural catheter -Pulmonary recommending repeating chest x-ray in a.m. and if she has a persistent effusion she will likely need TPA and dornase -They are giving her  Toradol for pain control continue Dilaudid.  They are also recommending PPI while she is on Toradol -Procalcitonin level is being checked and trending down but she is still being placed on antibiotics as above -We will likely need postpyloric feeding if she is not tolerating food however she is against this currently and wants to see if she can eat orally and diet has been advanced to full liquids; she is evaluated for a Cortrak however she wanted to attempt to advance her diet and nutrition has been consulted and remains a full code diet.  Nausea vomiting is improved and she does not want to advance past a full liquid diet today and wants to try a soft diet tomorrow   Hypomagnesemia -Patient's magnesium level is 1.4 and was repleted yesterday and is improved to 2.1 yesterday and is now 1.8 again so we will replete -Continue monitor and replete as necessary and repeat magnesium in the a.m.  Hypokalemia -Patient's potassium went from 3.6 and is now 3.4 For replete with p.o. KCl 40 mEq twice daily x2 doses Hypertension monitor and trend and repeat CMP in the a.m.   Hypoalbuminemia -Patient's albumin level has gone from 2.0 -> 1.8 -> 1.7 -Continue to monitor and trend and repeat CMP in a.m.   Acute splenic vein thrombosis -Eliquis has been reversed with Kcentra given the splenic hematoma -Found on CT scan to have a subcapsular hematoma and was transferred to [facility] -Surgery following and recommending n.p.o but she was able to have her diet advanced to clears yesterday and now is on fulls but does not want to advance past this and will continue full liquid diet for now.  Patient did have some increasing abdominal pain and increased work of breathing.  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   Loose stool and now constipated -Likely chronic for her loose stool in the setting of her pancreatic insufficiency -Had a colonoscopy which showed no evidence of microscopic colitis or celiac disease in the work-up in 2022 -GI recommending obtaining fecal elastase in outpatient setting -Now GI is going to start her on a bowel regimen and have added MiraLAX and considering a trial of Creon if she develops diarrhea with p.o. intake   Obesity -Complicates overall prognosis and care -Estimated body mass index is 31.91 kg/m as calculated from the following:   Height as of this encounter: '5\' 3"'$  (1.6 m).   Weight as of this encounter: 81.7 kg.  -Weight Loss and Dietary Counseling given   DVT prophylaxis: Place and maintain sequential compression device Start: 06/08/22 1450    Code Status: Full Code Family Communication: Discussed with family at bedside  Disposition Plan:  Level of care: Progressive Status is:  Inpatient Remains inpatient appropriate because: Needs to tolerate p.o. diet and have clearance by the specialist prior to safe discharge disposition   Consultants:  General surgery Gastroenterology PCCM  Procedures:  As delineated as above  Antimicrobials:  Anti-infectives (From admission, onward)    Start     Dose/Rate Route Frequency Ordered Stop   06/11/22 0000  vancomycin (VANCOREADY) IVPB 750 mg/150 mL  Status:  Discontinued        750 mg 150 mL/hr over 60 Minutes Intravenous Every 12 hours 06/10/22 1131 06/12/22 1244   06/10/22 1400  ceFEPIme (MAXIPIME) 2 g in sodium chloride 0.9 % 100 mL IVPB        2 g 200 mL/hr over 30 Minutes  Intravenous Every 8 hours 06/10/22 1131     06/10/22 1230  vancomycin (VANCOREADY) IVPB 1500 mg/300 mL        1,500 mg 150 mL/hr over 120 Minutes Intravenous  Once 06/10/22 1131 06/10/22 1503       Subjective: Seen and examined at bedside and she is doing little bit better and states abdominal pain is improved.  Tolerating a liquid diet but does not want to advance past this just yet.  Still having extreme pain in her catheter insertion site.  No other concerns or points at this time.  Objective: Vitals:   06/11/22 1954 06/11/22 2349 06/12/22 0344 06/12/22 1044  BP: 131/86 (!) 132/91 137/87 123/87  Pulse: (!) 114 (!) 104 (!) 101 (!) 116  Resp: 20 20 (!) 21 17  Temp: 98.8 F (37.1 C) 98.1 F (36.7 C) 98.8 F (37.1 C) 98.4 F (36.9 C)  TempSrc: Oral Oral Oral Oral  SpO2: 92% 92% 92% 95%  Weight:      Height:        Intake/Output Summary (Last 24 hours) at 06/12/2022 2024 Last data filed at 06/12/2022 1853 Gross per 24 hour  Intake 620 ml  Output 740 ml  Net -120 ml   Filed Weights   05/31/22 0824 06/08/22 1454  Weight: 83.6 kg 81.7 kg   Examination: Physical Exam:  Constitutional: Well-nourished, well-developed obese Caucasian female currently in no acute distress Respiratory: Diminished to auscultation bilaterally with coarse breath sounds and has a chest tube in the left and some crackles noted.  Unlabored breathing but is wearing 2 L supplemental oxygen via nasal cannula Cardiovascular: Mildly tachycardic rate but regular rhythm, no murmurs / rubs / gallops. S1 and S2 auscultated. No extremity edema.  Abdomen: Soft, non-tender, distended secondary to body habitus. Bowel sounds positive.  GU: Deferred. Musculoskeletal: No clubbing / cyanosis of digits/nails. No joint deformity upper and lower extremities.  Skin: No rashes, lesions, ulcers on limited skin evaluation. No induration; Warm and dry.  Neurologic: CN 2-12 grossly intact with no focal deficits. Romberg sign and  cerebellar reflexes not assessed.  Psychiatric: Normal judgment and insight. Alert and oriented x 3. Normal mood and appropriate affect.   Data Reviewed: I have personally reviewed following labs and imaging studies  CBC: Recent Labs  Lab 06/08/22 0438 06/08/22 1354 06/09/22 0145 06/09/22 0558 06/10/22 0539 06/10/22 0911 06/11/22 0433 06/11/22 0916 06/11/22 1608 06/11/22 2135 06/12/22 0541 06/12/22 1002 06/12/22 1558  WBC 22.6*  --  25.7*  --  21.1*  --  16.9*  --   --   --  18.2*  --   --   NEUTROABS  --   --   --   --  17.5*  --  13.4*  --   --   --  14.6*  --   --   HGB 13.3   < > 11.2*   < > 9.8*   < > 9.2*   < > 8.8* 8.9* 8.5* 9.1* 9.6*  HCT 38.6   < > 31.4*   < > 27.3*   < > 26.0*   < > 26.2* 24.9* 24.0* 25.7* 27.2*  MCV 102.9*  --  99.7  --  100.0  --  100.4*  --   --   --  100.0  --   --   PLT 503*  --  477*  --  482*  --  514*  --   --   --  506*  --   --    < > = values in this interval not displayed.   Basic Metabolic Panel: Recent Labs  Lab 06/09/22 0145 06/09/22 0558 06/10/22 0539 06/11/22 0433 06/12/22 0541  NA 138 134* 134* 134* 133*  K 4.4 4.4 4.1 3.6 3.4*  CL 96* 97* 97* 96* 97*  CO2 '27 27 27 27 27  '$ GLUCOSE 144* 133* 136* 111* 112*  BUN '8 10 13 '$ 21* 12  CREATININE 0.51 0.47 0.34* 0.55 0.40*  CALCIUM 9.1 8.4* 8.5* 8.5* 8.3*  MG 1.4* 1.4* 2.1 2.1 1.8  PHOS 4.3  --  3.3 3.9 3.2   GFR: Estimated Creatinine Clearance: 97.4 mL/min (A) (by C-G formula based on SCr of 0.4 mg/dL (L)). Liver Function Tests: Recent Labs  Lab 06/09/22 0145 06/09/22 0558 06/10/22 0539 06/11/22 0433 06/12/22 0541  AST 13* 14* 12* 12* 14*  ALT '8 8 8 8 6  '$ ALKPHOS 112 115 100 88 88  BILITOT 0.8 0.7 0.7 1.2 1.2  PROT 6.4* 6.3* 6.4* 6.4* 6.5  ALBUMIN 2.1* 2.0* 2.0* 1.8* 1.7*   Recent Labs  Lab 06/09/22 0145 06/10/22 0539  LIPASE 30 25   No results for input(s): "AMMONIA" in the last 168 hours. Coagulation Profile: No results for input(s): "INR", "PROTIME" in the  last 168 hours. Cardiac Enzymes: No results for input(s): "CKTOTAL", "CKMB", "CKMBINDEX", "TROPONINI" in the last 168 hours. BNP (last 3 results) No results for input(s): "PROBNP" in the last 8760 hours. HbA1C: No results for input(s): "HGBA1C" in the last 72 hours. CBG: No results for input(s): "GLUCAP" in the last 168 hours. Lipid Profile: No results for input(s): "CHOL", "HDL", "LDLCALC", "TRIG", "CHOLHDL", "LDLDIRECT" in the last 72 hours. Thyroid Function Tests: Recent Labs    06/10/22 0539  TSH 2.265   Anemia Panel: No results for input(s): "VITAMINB12", "FOLATE", "FERRITIN", "TIBC", "IRON", "RETICCTPCT" in the last 72 hours. Sepsis Labs: Recent Labs  Lab 06/08/22 0438 06/09/22 0145 06/09/22 0155 06/09/22 0558 06/10/22 0539  PROCALCITON 0.29 0.20  --   --  0.21  LATICACIDVEN  --   --  1.0 0.8  --     Recent Results (from the past 240 hour(s))  MRSA Next Gen by PCR, Nasal     Status: None   Collection Time: 06/08/22  2:34 PM   Specimen: Nasal Mucosa; Nasal Swab  Result Value Ref Range Status   MRSA by PCR Next Gen NOT DETECTED NOT DETECTED Final    Comment: (NOTE) The GeneXpert MRSA Assay (FDA approved for NASAL specimens only), is one component of a comprehensive MRSA colonization surveillance program. It is not intended to diagnose MRSA infection nor to guide or monitor treatment for MRSA infections. Test performance is not FDA approved in patients less than 64 years old. Performed at [facility], [phone]  [address] [place], [place] [address]   Culture, body fluid w Gram Stain-bottle     Status: None (Preliminary result)   Collection Time: 06/09/22 10:56 AM   Specimen: Pleura  Result Value Ref Range Status   Specimen Description PLEURAL  Final   Special Requests NONE  Final   Culture   Final    NO GROWTH 3 DAYS Performed at [facility] [address], [place], [place] [address]    Report Status PENDING  Incomplete  Gram stain     Status: None    Collection Time: 06/09/22 10:56 AM   Specimen: Pleura  Result Value Ref Range Status   Specimen Description PLEURAL  Final   Special Requests NONE  Final   Gram Stain   Final    FEW WBC PRESENT,BOTH PMN AND MONONUCLEAR NO ORGANISMS SEEN Performed at [facility], [address] [place], [place] [address]    Report Status 06/09/2022 FINAL  Final     Radiology Studies: DG CHEST PORT 1 VIEW  Result Date: 06/12/2022 CLINICAL DATA:  Pleural effusion, chest tube EXAM: PORTABLE CHEST 1 VIEW COMPARISON:  Portable exam 0640 hours compared to 06/11/2022 FINDINGS: Pigtail LEFT thoracostomy tube unchanged. Enlargement of cardiac silhouette. Mediastinal contour stable. Small LEFT pleural effusion and basilar atelectasis. No definite acute infiltrate, pneumothorax or acute osseous findings. IMPRESSION: Enlargement of cardiac silhouette. Persistent LEFT pleural effusion and basilar atelectasis despite pigtail thoracostomy tube. Electronically Signed   By: Lavonia Dana M.D.   On: 06/12/2022 10:00   DG CHEST PORT 1 VIEW  Result Date: 06/11/2022 CLINICAL DATA:  Left-sided chest tube. EXAM: PORTABLE CHEST 1 VIEW COMPARISON:  June 10, 2022. FINDINGS: Stable cardiomegaly. Stable left-sided chest tube is noted no definite pneumothorax is noted currently. Left pleural effusion and left basilar atelectasis is again noted. IMPRESSION: Stable position of left-sided chest tube with possible kink within it. No definite pneumothorax is noted. Stable left basilar opacity. Electronically Signed   By: Marijo Conception M.D.   On: 06/11/2022 08:16    Scheduled Meds:  Chlorhexidine Gluconate Cloth  6 each Topical Daily   feeding supplement  237 mL Oral BID BM   folic acid  1 mg Oral Daily    HYDROmorphone (DILAUDID) injection  1 mg Intravenous Q4H   influenza vac split quadrivalent PF  0.5 mL Intramuscular Tomorrow-1000   polyethylene glycol  17 g Oral Daily   potassium chloride SA  60 mEq Oral Daily   sodium  chloride flush  10 mL Intrapleural Q8H   sodium chloride flush  10 mL Intrapleural Q8H   sodium chloride flush  10 mL Intrapleural Q8H   Continuous Infusions:  ceFEPime (MAXIPIME) IV 2 g (06/12/22 1547)    LOS: 11 days   Raiford Noble, DO [facility] Available via Epic secure chat 7am-7pm After these hours, please refer to coverage provider listed on [email or web address] 06/12/2022, 8:24 PM

## 2022-06-12 NOTE — Progress Notes (Addendum)
Progress Note  Primary GI: Dr. Marius Ditch   Subjective  Chief Complaint: Pancreatitis  States breathing improved greatly with chest tube for hydrothorax, no shortness of breath or chest pain, has had worsening left flank/back pain, worse with movement, worse with bearing down.  States pain medications every 4 hours is not enough, discussed with hospitalist. Patient started on full liquid diet yesterday tolerating well, discussed advancing but declines at this time secondary to pain. Denies nausea, vomiting, fever or chills overnight. Patient's not had a bowel movement for a week, but has been passing gas, feels if she may be able to have a bowel movement but with bearing down has left back pain    Objective   Vital signs in last 24 hours: Temp:  [98.1 F (36.7 C)-98.8 F (37.1 C)] 98.8 F (37.1 C) (11/07 0344) Pulse Rate:  [101-114] 101 (11/07 0344) Resp:  [20-21] 21 (11/07 0344) BP: (131-137)/(86-91) 137/87 (11/07 0344) SpO2:  [92 %] 92 % (11/07 0344) Last BM Date : 06/04/22 Last BM recorded by nurses in past 5 days Stool Type: -- (unable to visualize) (06/10/2022  8:00 AM)  General:   female in no acute distress  Heart:  Regular rhythm, sinus tachycardia; no murmurs Pulm: Clear anteriorly; no wheezing, chest tube in place left side, 2 L [place] Abdomen:  Soft, Obese AB, Sluggish bowel sounds. No tenderness. Without guarding and Without rebound, No organomegaly appreciated. Extremities:  without  edema. Neurologic:  Alert and  oriented x4;  No focal deficits.  Psych:  Cooperative. Normal mood and affect.  Intake/Output from previous day: 11/06 0701 - 11/07 0700 In: 630 [P.O.:360; IV Piggyback:250] Out: 760 [Urine:600; Chest Tube:160] Intake/Output this shift: No intake/output data recorded.  Studies/Results: DG CHEST PORT 1 VIEW  Result Date: 06/11/2022 CLINICAL DATA:  Left-sided chest tube. EXAM: PORTABLE CHEST 1 VIEW COMPARISON:  June 10, 2022. FINDINGS: Stable  cardiomegaly. Stable left-sided chest tube is noted no definite pneumothorax is noted currently. Left pleural effusion and left basilar atelectasis is again noted. IMPRESSION: Stable position of left-sided chest tube with possible kink within it. No definite pneumothorax is noted. Stable left basilar opacity. Electronically Signed   By: Marijo Conception M.D.   On: 06/11/2022 08:16   DG Chest Port 1 View  Result Date: 06/10/2022 CLINICAL DATA:  Left chest tube placement EXAM: PORTABLE CHEST 1 VIEW COMPARISON:  Previous studies including the examination done earlier today FINDINGS: Transverse diameter of heart is increased. There is significant interval decrease in left pleural effusion after placement of left chest tube. There is a kink in the distal course of the left chest tube approximately 7 cm from the tip. There is possible trace amount of pneumothorax in the lateral aspect of left upper lung field. There is possible tiny left apical pneumothorax. Residual increased density in left lower lung fields suggest possible underlying atelectasis/pneumonia. There is increase in patchy infiltrate in right upper lung fields. IMPRESSION: There is marked interval decrease in left pleural effusion after placement of left chest tube. Residual increased density in left lower lung fields may be due to pleural effusion and underlying atelectasis/pneumonia. A kink is noted in the distal course of left chest tube. Possible tiny pneumothorax is noted in the left apex and lateral aspect of left upper lung field. There is interval increase in patchy infiltrate in right upper lung field suggesting worsening of pneumonia. Electronically Signed   By: Elmer Picker M.D.   On: 06/10/2022 11:57   DG  Abd 1 View  Result Date: 06/10/2022 CLINICAL DATA:  Abdominal pain and distension EXAM: ABDOMEN - 1 VIEW COMPARISON:  None Available. FINDINGS: Scattered large and small bowel gas is noted. No free air is seen. Increasing  left-sided effusion is noted when compare with the prior chest exam. No acute bony abnormality is seen. IMPRESSION: Unremarkable abdomen. Increasing left-sided pleural effusion. Electronically Signed   By: Inez Catalina M.D.   On: 06/10/2022 09:56   DG CHEST PORT 1 VIEW  Result Date: 06/10/2022 CLINICAL DATA:  Shortness of breath EXAM: PORTABLE CHEST 1 VIEW COMPARISON:  06/09/2022 FINDINGS: Cardiac shadow is enlarged but stable. Increasing left-sided pleural effusion is noted when compare with the prior exam. Persistent and slightly increased airspace opacity in the right apex is noted. No bony abnormality is noted. IMPRESSION: Increasing left-sided effusion compared with the prior exam. Increasing right apical airspace opacity. Electronically Signed   By: Inez Catalina M.D.   On: 06/10/2022 09:55    Lab Results: Recent Labs    06/10/22 0539 06/10/22 0911 06/11/22 0433 06/11/22 0916 06/11/22 1608 06/11/22 2135 06/12/22 0541  WBC 21.1*  --  16.9*  --   --   --  18.2*  HGB 9.8*   < > 9.2*   < > 8.8* 8.9* 8.5*  HCT 27.3*   < > 26.0*   < > 26.2* 24.9* 24.0*  PLT 482*  --  514*  --   --   --  506*   < > = values in this interval not displayed.   BMET Recent Labs    06/10/22 0539 06/11/22 0433 06/12/22 0541  NA 134* 134* 133*  K 4.1 3.6 3.4*  CL 97* 96* 97*  CO2 '27 27 27  '$ GLUCOSE 136* 111* 112*  BUN 13 21* 12  CREATININE 0.34* 0.55 0.40*  CALCIUM 8.5* 8.5* 8.3*   LFT Recent Labs    06/12/22 0541  PROT 6.5  ALBUMIN 1.7*  AST 14*  ALT 6  ALKPHOS 88  BILITOT 1.2   PT/INR No results for input(s): "LABPROT", "INR" in the last 72 hours.   Scheduled Meds:  sodium chloride   Intravenous Once   Chlorhexidine Gluconate Cloth  6 each Topical Daily   feeding supplement  237 mL Oral BID BM   folic acid  1 mg Oral Daily    HYDROmorphone (DILAUDID) injection  1 mg Intravenous Q4H   influenza vac split quadrivalent PF  0.5 mL Intramuscular Tomorrow-1000   lidocaine (PF)  6 mL  Intradermal Once   potassium chloride SA  60 mEq Oral Daily   sodium chloride flush  10 mL Intrapleural Q8H   Continuous Infusions:  ceFEPime (MAXIPIME) IV 2 g (06/12/22 0531)   vancomycin 750 mg (06/11/22 2357)      Patient profile:   36 yo female with pmh of Liddle's syndrome, chronic loose stool, recurrent acute pancreatitis ( presumed Etoh). Seen in consultation by [place] on 11/4. She had been transferred here from [facility]  for further management of acute pancreatitis complicated by possible developing pseudocyst (vrs area of necrosis) , acute SV thrombosis and splenic hematoma ( after starting Eliquis for SV thrombosis).   Impression/Plan:    Acute pancreatitis secondary to ETOH complicated by developing pseudocyst vrs area of necrosis  -BUN 12, creatinine 0.40 improved from yesterday, continue to monitor kidney function -Pain control per the inpatient medical team.  Patient is complaining more so about left chest, left back pain, appears to be more associated  with chest tube versus worsening splenic hematoma?  With 1 g drop of hemoglobin, Nontender abdomen. Consider repeat CT to evaluate further -Encourage early ambulation -Patient was being evaluated for core track, however patient wanted to attempt advancing diet, nutrition consulted.  Has been on full liquid diet yesterday, no nausea no abdominal pain, patient would like to continue full liquid diet today and potentially advance later today versus tomorrow. -Advance diet as tolerated. -Smoking and alcohol cessation counseling.  Acute SV thrombosis and splenic hematoma ( after starting Eliquis) IR has seen the patient states splenic vein completely occluded no role for recanalization at this time.  Discussed possible splenic embolization for recurrent bleeding. Surgery is following along. Received Kcentra.  Anti-coagulation on hold.  Last CT AB and pelvis 06/08/2022 interval development of 500+ ml mixed attenuation  perisplenic/subcapsular splenic hematoma, no active extravasation, new pseudocyst 4.4 lateral wall of gastric body, consider repeat Hgb with slight drop 10.4 to 9.8 to 9.2 now at 8.5, left back/upper chest pain, no occult GI bleeding, likely due to the chest tube, worse with movement   Acute respiratory failure secondary to pleural effusion .  S/p  500 ml thoracentesis 11/04( Exudative).  Gram stain negative for organisms.No growth day 3 Status post left chest tube 11/05, currently on cefepime and vancomycin On 2 L nasal cannula  Chronic loose stool however has not had bowel movement for a week, decreased p.o. intake, and is passing gas Add on MiraLAX for constipation, consider trial of Creon patient is p.o. again if she develops diarrhea No evidence for microscopic colitis or celiac disease on workup in 2022. Possibly EPI?   Last fecal elastase in 2021 was normal  Principal Problem:   Acute pancreatitis Active Problems:   Essential hypertension   Diarrhea   Splenic vein thrombosis   Liddle's syndrome   Spleen hematoma   Acute hypoxic respiratory failure (HCC)   Exudative pleural effusion   HCAP (healthcare-associated pneumonia)   Pancreatic pseudocyst    LOS: 11 days   Vladimir Crofts  06/12/2022, 9:04 AM  GI ATTENDING  Interval history data reviewed.  Patient seen and examined.  Agree with interval progress note as outlined above.  Stable from GI standpoint.  Tolerating liquids.  Discomfort from chest tube.  Splenic hematoma per surgery.  From GI standpoint, recommend advancing diet as tolerated.  After discharge she will return to her primary gastroenterologist in [place], Dr. Marius Ditch.  We will sign off at this time, but are available for questions or problems.  Thanks.  Docia Chuck. Geri Seminole., M.D. [facility] Division of Gastroenterology

## 2022-06-12 NOTE — Progress Notes (Addendum)
Subjective: CC: Complains of chest tube discomfort. No sob.  No abdominal pain presently. Occasional "gas pains" that she describes as bloating of her upper abdomen. Tolerating fld without n/v. Passing flatus. No BM since 10/30.   Objective: Vital signs in last 24 hours: Temp:  [98.1 F (36.7 C)-98.8 F (37.1 C)] 98.8 F (37.1 C) (11/07 0344) Pulse Rate:  [101-114] 101 (11/07 0344) Resp:  [20-21] 21 (11/07 0344) BP: (131-137)/(86-91) 137/87 (11/07 0344) SpO2:  [92 %] 92 % (11/07 0344) Last BM Date : 06/04/22  Intake/Output from previous day: 11/06 0701 - 11/07 0700 In: 630 [P.O.:360; IV Piggyback:250] Out: 760 [Urine:600; Chest Tube:160] Intake/Output this shift: No intake/output data recorded.  PE: Gen:  Alert, NAD, pleasant Abd: Soft, ND, very mild upper abdominal ttp diffusely without rigidity or guarding. +BS Psych: A&Ox3   Lab Results:  Recent Labs    06/11/22 0433 06/11/22 0916 06/11/22 2135 06/12/22 0541  WBC 16.9*  --   --  18.2*  HGB 9.2*   < > 8.9* 8.5*  HCT 26.0*   < > 24.9* 24.0*  PLT 514*  --   --  506*   < > = values in this interval not displayed.   BMET Recent Labs    06/11/22 0433 06/12/22 0541  NA 134* 133*  K 3.6 3.4*  CL 96* 97*  CO2 27 27  GLUCOSE 111* 112*  BUN 21* 12  CREATININE 0.55 0.40*  CALCIUM 8.5* 8.3*   PT/INR No results for input(s): "LABPROT", "INR" in the last 72 hours. CMP     Component Value Date/Time   NA 133 (L) 06/12/2022 0541   NA 140 07/27/2020 1555   K 3.4 (L) 06/12/2022 0541   CL 97 (L) 06/12/2022 0541   CO2 27 06/12/2022 0541   GLUCOSE 112 (H) 06/12/2022 0541   BUN 12 06/12/2022 0541   BUN 7 07/27/2020 1555   CREATININE 0.40 (L) 06/12/2022 0541   CREATININE 0.40 (L) 05/17/2022 1230   CALCIUM 8.3 (L) 06/12/2022 0541   PROT 6.5 06/12/2022 0541   PROT 7.0 07/27/2020 1555   ALBUMIN 1.7 (L) 06/12/2022 0541   ALBUMIN 4.1 07/27/2020 1555   AST 14 (L) 06/12/2022 0541   ALT 6 06/12/2022 0541    ALKPHOS 88 06/12/2022 0541   BILITOT 1.2 06/12/2022 0541   BILITOT 0.9 07/27/2020 1555   GFRNONAA >60 06/12/2022 0541   GFRNONAA 111 10/11/2020 1614   GFRAA 129 10/11/2020 1614   Lipase     Component Value Date/Time   LIPASE 25 06/10/2022 0539    Studies/Results: DG CHEST PORT 1 VIEW  Result Date: 06/11/2022 CLINICAL DATA:  Left-sided chest tube. EXAM: PORTABLE CHEST 1 VIEW COMPARISON:  June 10, 2022. FINDINGS: Stable cardiomegaly. Stable left-sided chest tube is noted no definite pneumothorax is noted currently. Left pleural effusion and left basilar atelectasis is again noted. IMPRESSION: Stable position of left-sided chest tube with possible kink within it. No definite pneumothorax is noted. Stable left basilar opacity. Electronically Signed   By: Marijo Conception M.D.   On: 06/11/2022 08:16   DG Chest Port 1 View  Result Date: 06/10/2022 CLINICAL DATA:  Left chest tube placement EXAM: PORTABLE CHEST 1 VIEW COMPARISON:  Previous studies including the examination done earlier today FINDINGS: Transverse diameter of heart is increased. There is significant interval decrease in left pleural effusion after placement of left chest tube. There is a kink in the distal course of the left chest  tube approximately 7 cm from the tip. There is possible trace amount of pneumothorax in the lateral aspect of left upper lung field. There is possible tiny left apical pneumothorax. Residual increased density in left lower lung fields suggest possible underlying atelectasis/pneumonia. There is increase in patchy infiltrate in right upper lung fields. IMPRESSION: There is marked interval decrease in left pleural effusion after placement of left chest tube. Residual increased density in left lower lung fields may be due to pleural effusion and underlying atelectasis/pneumonia. A kink is noted in the distal course of left chest tube. Possible tiny pneumothorax is noted in the left apex and lateral aspect of left  upper lung field. There is interval increase in patchy infiltrate in right upper lung field suggesting worsening of pneumonia. Electronically Signed   By: Elmer Picker M.D.   On: 06/10/2022 11:57   DG Abd 1 View  Result Date: 06/10/2022 CLINICAL DATA:  Abdominal pain and distension EXAM: ABDOMEN - 1 VIEW COMPARISON:  None Available. FINDINGS: Scattered large and small bowel gas is noted. No free air is seen. Increasing left-sided effusion is noted when compare with the prior chest exam. No acute bony abnormality is seen. IMPRESSION: Unremarkable abdomen. Increasing left-sided pleural effusion. Electronically Signed   By: Inez Catalina M.D.   On: 06/10/2022 09:56   DG CHEST PORT 1 VIEW  Result Date: 06/10/2022 CLINICAL DATA:  Shortness of breath EXAM: PORTABLE CHEST 1 VIEW COMPARISON:  06/09/2022 FINDINGS: Cardiac shadow is enlarged but stable. Increasing left-sided pleural effusion is noted when compare with the prior exam. Persistent and slightly increased airspace opacity in the right apex is noted. No bony abnormality is noted. IMPRESSION: Increasing left-sided effusion compared with the prior exam. Increasing right apical airspace opacity. Electronically Signed   By: Inez Catalina M.D.   On: 06/10/2022 09:55    Anti-infectives: Anti-infectives (From admission, onward)    Start     Dose/Rate Route Frequency Ordered Stop   06/11/22 0000  vancomycin (VANCOREADY) IVPB 750 mg/150 mL        750 mg 150 mL/hr over 60 Minutes Intravenous Every 12 hours 06/10/22 1131     06/10/22 1400  ceFEPIme (MAXIPIME) 2 g in sodium chloride 0.9 % 100 mL IVPB        2 g 200 mL/hr over 30 Minutes Intravenous Every 8 hours 06/10/22 1131     06/10/22 1230  vancomycin (VANCOREADY) IVPB 1500 mg/300 mL        1,500 mg 150 mL/hr over 120 Minutes Intravenous  Once 06/10/22 1131 06/10/22 1503        Assessment/Plan Acute SV thrombosis and splenic hematoma ( after starting Eliquis) - Patient found to have  splenic vein thrombosis and was started on Eliquis. CT that demonstrates a splenic hematoma without extravasation. S/p Kcentra. Anticoagulation on hold.  - IR has seen the patient - splenic vein completely occluded no role for recanalization at this time. She is not a candidate for splenic vein thrombectomy given advanced pancreatitis and pseudocyst formation.  - H&H stable (9.1 this am from 9.2 yesterday). Tachycardia noted but also stable over course of admission. No hypotension. No role for surgical intervention at this time. Would recommend holding anticoagulation for 2 weeks. Would recommend repeat CT scan at 2 weeks before resuming anticoagulation.  If she was to progressively drop H&H or become hemodynamically unstable, please call our service back for evaluation. We will sign off at this time.   FEN: Per GI VTE: As above ID:  Per CCM/TRH  Acute pancreatitis secondary to ETOH complicated by developing pseudocyst vrs area of necrosis  - Per GI - No surgery indicated at this time.   Acute respiratory failure secondary to pleural effusion with atelectasis vs infiltrate  - Status post left chest tube 11/05, currently on cefepime and vancomycin - Per CCM    LOS: 11 days    Jillyn Ledger , [facility] 06/12/2022, 9:24 AM Please see Amion for pager number during day hours 7:00am-4:30pm

## 2022-06-13 ENCOUNTER — Inpatient Hospital Stay (HOSPITAL_COMMUNITY): Payer: 59

## 2022-06-13 LAB — COMPREHENSIVE METABOLIC PANEL
ALT: 7 U/L (ref 0–44)
AST: 14 U/L — ABNORMAL LOW (ref 15–41)
Albumin: 1.6 g/dL — ABNORMAL LOW (ref 3.5–5.0)
Alkaline Phosphatase: 97 U/L (ref 38–126)
Anion gap: 13 (ref 5–15)
BUN: 9 mg/dL (ref 6–20)
CO2: 29 mmol/L (ref 22–32)
Calcium: 8.9 mg/dL (ref 8.9–10.3)
Chloride: 93 mmol/L — ABNORMAL LOW (ref 98–111)
Creatinine, Ser: 0.35 mg/dL — ABNORMAL LOW (ref 0.44–1.00)
GFR, Estimated: >60 mL/min (ref >60)
Glucose, Bld: 117 mg/dL — ABNORMAL HIGH (ref 70–99)
Potassium: 2.9 mmol/L — ABNORMAL LOW (ref 3.5–5.1)
Sodium: 135 mmol/L (ref 135–145)
Total Bilirubin: 1 mg/dL (ref 0.3–1.2)
Total Protein: 6.2 g/dL — ABNORMAL LOW (ref 6.5–8.1)

## 2022-06-13 LAB — MAGNESIUM: Magnesium: 1.8 mg/dL (ref 1.7–2.4)

## 2022-06-13 LAB — CBC WITH DIFFERENTIAL/PLATELET
Abs Immature Granulocytes: 0.14 10*3/uL — ABNORMAL HIGH (ref 0.00–0.07)
Basophils Absolute: 0.1 10*3/uL (ref 0.0–0.1)
Basophils Relative: 0 %
Eosinophils Absolute: 0.1 10*3/uL (ref 0.0–0.5)
Eosinophils Relative: 1 %
HCT: 27 % — ABNORMAL LOW (ref 36.0–46.0)
Hemoglobin: 9.2 g/dL — ABNORMAL LOW (ref 12.0–15.0)
Immature Granulocytes: 1 %
Lymphocytes Relative: 15 %
Lymphs Abs: 2.6 10*3/uL (ref 0.7–4.0)
MCH: 34.1 pg — ABNORMAL HIGH (ref 26.0–34.0)
MCHC: 34.1 g/dL (ref 30.0–36.0)
MCV: 100 fL (ref 80.0–100.0)
Monocytes Absolute: 1.3 10*3/uL — ABNORMAL HIGH (ref 0.1–1.0)
Monocytes Relative: 7 %
Neutro Abs: 13.5 10*3/uL — ABNORMAL HIGH (ref 1.7–7.7)
Neutrophils Relative %: 76 %
Platelets: 668 10*3/uL — ABNORMAL HIGH (ref 150–400)
RBC: 2.7 MIL/uL — ABNORMAL LOW (ref 3.87–5.11)
RDW: 17.3 % — ABNORMAL HIGH (ref 11.5–15.5)
WBC: 17.7 10*3/uL — ABNORMAL HIGH (ref 4.0–10.5)
nRBC: 0 % (ref 0.0–0.2)

## 2022-06-13 LAB — PHOSPHORUS: Phosphorus: 3.1 mg/dL (ref 2.5–4.6)

## 2022-06-13 LAB — CYTOLOGY - NON PAP

## 2022-06-13 MED ORDER — OXYCODONE HCL 5 MG PO TABS
10.0000 mg | ORAL_TABLET | ORAL | Status: DC | PRN
  Administered 2022-06-13 – 2022-06-14 (×5): 10 mg via ORAL
  Filled 2022-06-13 (×5): qty 2

## 2022-06-13 MED ORDER — POTASSIUM CHLORIDE CRYS ER 20 MEQ PO TBCR
40.0000 meq | EXTENDED_RELEASE_TABLET | Freq: Once | ORAL | Status: AC
  Administered 2022-06-13: 40 meq via ORAL
  Filled 2022-06-13: qty 2

## 2022-06-13 MED ORDER — ALUM & MAG HYDROXIDE-SIMETH 200-200-20 MG/5ML PO SUSP
30.0000 mL | ORAL | Status: DC | PRN
  Administered 2022-06-13: 30 mL via ORAL
  Filled 2022-06-13: qty 30

## 2022-06-13 MED FILL — Fentanyl Citrate Preservative Free (PF) Inj 100 MCG/2ML: INTRAMUSCULAR | Qty: 2 | Status: AC

## 2022-06-13 NOTE — Progress Notes (Signed)
Called by RN for assistance to pull chest tube. Left sided chest tube removed without complications per order. Site without drainage post removal. Occlusive dressing placed. CXR placed post removal per MD.

## 2022-06-13 NOTE — Progress Notes (Signed)
[facility] PROGRESS NOTE  Zariya Minner (DOB: 1986/07/09) GBT:[id number] PCP: [name], MD  Brief Narrative: Jillian Shepherd is a 36 y.o. female with a history of EtOH use, recent acute alcoholic pancreatitis admission who presented to the ED on 10/26 with abdominal pain found to have persistent pancreatitis on CT with splenic vein thrombosis. Anticoagulation was given, though with increasing pain, CT was repeated showing developing pseudocyst and splenic hematoma for which anticoagulation was stopped, Kcentra given. Surgery was consulted, recommended transfer [facility] > [facility], though they do not recommend any operative management. IR was consulted, felt the patient was not candidate for splenic vein thrombectomy and did not end up requiring embolization with stabilized blood counts and improved pain. GI was consulted and followed the patient's clinical course. Diet has been initiated and advanced slowly with conversion of IV pain medications to oral on 11/8.   She developed shortness of breath. CT on 11/3 showing moderate left pleural effusion with atelectasis. Thoracentesis 11/4 with effusion exudative by Light's criteria, and the patient's respiratory status worsened with CXR showing reaccumulation appearing loculated. Chest tube placed and antibiotics started for possible HAP 11/5. Chest tube discontinued 11/8.  Subjective: Pain in upper abdomen radiating to LUQ and up to left shoulder is stable, continues slow improvement. Pain associated with chest tube was severe and is improved after removal. Amenable to advancing diet, tolerating broths thus far.   Objective: BP 128/86 (BP Location: Right Arm)   Pulse (!) 103   Temp 98.5 F (36.9 C) (Oral)   Resp 20   Ht '5\' 3"'$  (1.6 m)   Wt 81.7 kg   SpO2 97%   BMI 31.91 kg/m   Gen: No distress Pulm: Diminished to at least the mid lung zone on left, no crackles on right no wheezes  CV: Regular tachycardia GI: Soft, tender in epigastrium,  LUQ, no LLQ tenderness or rebound. Hypoactive BS.  Neuro: Alert and oriented. No new focal deficits. Ext: Warm, no deformities Skin: CT site c/d/i. No rashes, lesions or ulcers on visualized skin   Assessment & Plan: Acute recurrent pancreatitis with pseudocyst:  - Advance diet today, concomitantly switch to enteral analgesic dosing.   Splenic vein thrombosis > hematoma: No embo or surgical management required.  - Hold anticoagulation for at least 2 weeks at which point a CT can be repeated with consideration for reinitiation of anticoagulation if H/H and imaging show stability/improvement.   Acute hypoxic respiratory failure: Due to pleural effusion, suspected parapneumonic with neutrophilic predominance. Negative gram stain and culture to date. - Continue antibiotics with cefepime for HAP coverage, MRSA negative.   - Remains hypoxemic with ambulation. Continue antibiotics, repeat CXR in AM. Has severely diminished left-sided breath sounds.   Liddle's syndrome: Stable - Continue standing K supp  Hypokalemia, hypomagnesemia:  - Add additional KDUR 76mq this PM and monitor.   HTN:  - Continue norvasc  Hypoalbuminemia, moderate protein-calorie malnutrition:  - Supplement protein once able to tolerate po, alternatively, low threshold for postpyloric feeding tube if fails diet advancement.  Loose stool and now constipated -Likely chronic for her loose stool in the setting of her pancreatic insufficiency -Had a colonoscopy which showed no evidence of microscopic colitis or celiac disease in the work-up in 2022 -GI recommending obtaining fecal elastase in outpatient setting -Now GI is going to start her on a bowel regimen and have added MiraLAX and considering a trial of Creon if she develops diarrhea with p.o. intake  Obesity: Body mass index is 31.91  kg/m.    Patrecia Pour, MD [facility] [email or web address] 06/13/2022, 3:57 PM

## 2022-06-13 NOTE — Progress Notes (Signed)
NAME:  Jillian Shepherd, MRN:  [id number], DOB:  [birth year], LOS: 12 ADMISSION DATE:  05/31/2022, CONSULTATION DATE:  11/5 REFERRING MD:  Alfredia Ferguson, CHIEF COMPLAINT:  acute hypoxic resp failure  History of Present Illness:  36 year old female patient critical care asked to evaluate acutely on 11/5 for acute worsening of hypoxic respiratory failure.  She was initially hospitalized for acute pancreatitis earlier in October 2023, then subsequently readmitted on 10/26 with worsening abdominal pain.  She was found to have splenic thrombosis, and was placed on anticoagulation.  She been treated medically, over the course of her hospitalization she has had worsening abdominal pain, developed splenic hematoma, as well as progression of pancreatic pseudocyst.  She required reversal of anticoagulation with Kcentra, and since her hemoglobin is stabilized.  She has been followed by general surgery as well as gastroenterology.  However more frequently over the last couple days she has had progressive respiratory failure requiring escalating supplemental oxygen needs.  A CT scan was done on 11/3 that showed a moderate left-sided pleural effusion fairly significant left-sided atelectasis and small amount of abdominal ascites.  She underwent a therapeutic and diagnostic thoracentesis on 11/4 with fluid was exudative by lights criteria.  Over the course of the evening her oxygen requirements have continued to improve she is now on 100% FiO2 as well as heated high flow, portable chest x-ray shows reaccumulation of left-sided pleural effusion that appears loculated, critical care asked to evaluate for progressive respiratory failure.  Pertinent  Medical History   Alcohol abuse, acute alcoholic pancreatitis, splenic vein thrombosis, Significant Hospital Events: Including procedures, antibiotic start and stop dates in addition to other pertinent events   10/28 admitted for worsening abd pain w/ splenic vein thrombosis as well  as worsening pancreatitis and pseudocyst. IVFs started. Started on [facility].  11/1-11/3 worsening pain. CT of the abdomen pelvis with IV contrast was ordered stat which showed presence of new perisplenic hematoma without active extravasation and a 4.4 cm collection in the lateral wall of the gastric body consistent with possible pseudocyst. Got K centra. AC stopped 11/4 incr'd O2 needs. Thora 500 ml, exudate by lights criteria 11/5 marked increase in O2 needs. High flow and FM. CXR w/ marked inc Left sided effusion. Pt placed on HFNC. Left chest tube placed abx started for HCAP coverage (vanc and cefepime)   Interim History / Subjective:   Continues to complain of pain at chest tube site Afebrile 340 cc drainage last 24 hours, serosanguineous  Objective   Blood pressure (!) 136/100, pulse (!) 103, temperature 99.1 F (37.3 C), temperature source Oral, resp. rate 20, height '5\' 3"'$  (1.6 m), weight 81.7 kg, SpO2 96 %.        Intake/Output Summary (Last 24 hours) at 06/13/2022 1135 Last data filed at 06/13/2022 0602 Gross per 24 hour  Intake 260 ml  Output 890 ml  Net -630 ml    Filed Weights   05/31/22 0824 06/08/22 1454  Weight: 83.6 kg 81.7 kg    Examination: General: Young woman sitting in a chair, no distress HENT: NCAT no JVD MMM Lungs: Decreased breath sounds left base, no accessory muscle use, serosanguineous drainage from chest tube, no air leak Cardiovascular: tachy rrr no MRG  Abdomen: Soft, nontender Extremities: warm and dry trace edema  Neuro: awake and oriented    Chest x-ray continues to show left lower lobe infiltrate, atelectasis versus effusion versus consolidation  Labs show hypokalemia, persistent leukocytosis  Resolved Hospital Problem list     Assessment &  Plan:  Acute hypoxic respiratory failure 2/2 large left pleural effusion w/ atelectasis vs infiltrate.  -effusion exudate by lights, neutrophilic  Plan Due to persistent pain, will discontinue chest  tube.  Doubt there will be any benefit with more lytics.  Persistent left lower lobe infiltrate is likely related to underlying consolidated lung    Acute on chronic alcoholic pancreatitis w/ pseudocyst.  Followed by general surg. 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 Plan Per primary  PCCM will be available as needed  Best Practice (right click and "Reselect all SmartList Selections" daily)   Diet/type: NPO DVT prophylaxis: SCD GI prophylaxis: N/A Lines: N/A Foley:  N/A Code Status:  full code Last date of multidisciplinary goals of care discussion [per primary ]  Labs   CBC: Recent Labs  Lab 06/09/22 0145 06/09/22 0558 06/10/22 0539 06/10/22 0911 06/11/22 0433 06/11/22 0916 06/12/22 0541 06/12/22 1002 06/12/22 1558 06/12/22 2214 06/13/22 0309  WBC 25.7*  --  21.1*  --  16.9*  --  18.2*  --   --   --  17.7*  NEUTROABS  --   --  17.5*  --  13.4*  --  14.6*  --   --   --  13.5*  HGB 11.2*   < > 9.8*   < > 9.2*   < > 8.5* 9.1* 9.6* 10.4* 9.2*  HCT 31.4*   < > 27.3*   < > 26.0*   < > 24.0* 25.7* 27.2* 29.7* 27.0*  MCV 99.7  --  100.0  --  100.4*  --  100.0  --   --   --  100.0  PLT 477*  --  482*  --  514*  --  506*  --   --   --  668*   < > = values in this interval not displayed.     Basic Metabolic Panel: Recent Labs  Lab 06/09/22 0145 06/09/22 0558 06/10/22 0539 06/11/22 0433 06/12/22 0541 06/13/22 0309  NA 138 134* 134* 134* 133* 135  K 4.4 4.4 4.1 3.6 3.4* 2.9*  CL 96* 97* 97* 96* 97* 93*  CO2 '27 27 27 27 27 29  '$ GLUCOSE 144* 133* 136* 111* 112* 117*  BUN '8 10 13 '$ 21* 12 9  CREATININE 0.51 0.47 0.34* 0.55 0.40* 0.35*  CALCIUM 9.1 8.4* 8.5* 8.5* 8.3* 8.9  MG 1.4* 1.4* 2.1 2.1 1.8 1.8  PHOS 4.3   --  3.3 3.9 3.2 3.1    GFR: Estimated Creatinine Clearance: 97.4 mL/min (A) (by C-G formula based on SCr of 0.35 mg/dL (L)). Recent Labs  Lab 06/08/22 0438 06/09/22 0145 06/09/22 0155 06/09/22 0558 06/10/22 0539 06/11/22 0433 06/12/22 0541 06/13/22 0309  PROCALCITON 0.29 0.20  --   --  0.21  --   --   --   WBC 22.6* 25.7*  --   --  21.1* 16.9* 18.2* 17.7*  LATICACIDVEN  --   --  1.0 0.8  --   --   --   --      Liver Function Tests: Recent Labs  Lab 06/09/22 0558 06/10/22 0539 06/11/22 0433 06/12/22 0541 06/13/22 0309  AST 14* 12* 12* 14* 14*  ALT '8 8 8 6 7  '$ ALKPHOS 115 100  88 88 97  BILITOT 0.7 0.7 1.2 1.2 1.0  PROT 6.3* 6.4* 6.4* 6.5 6.2*  ALBUMIN 2.0* 2.0* 1.8* 1.7* 1.6*    Recent Labs  Lab 06/09/22 0145 06/10/22 0539  LIPASE 30 25    No results for input(s): "AMMONIA" in the last 168 hours.  ABG    Component Value Date/Time   PHART 7.45 06/10/2022 0830   PCO2ART 49 (H) 06/10/2022 0830   PO2ART 139 (H) 06/10/2022 0830   HCO3 34.1 (H) 06/10/2022 0830   O2SAT 100 06/10/2022 0830     Coagulation Profile: No results for input(s): "INR", "PROTIME" in the last 168 hours.  Cardiac Enzymes: No results for input(s): "CKTOTAL", "CKMB", "CKMBINDEX", "TROPONINI" in the last 168 hours.  HbA1C: Hgb A1c MFr Bld  Date/Time Value Ref Range Status  05/02/2022 09:03 AM 5.3 <5.7 % of total Hgb Final    Comment:    For the purpose of screening for the presence of diabetes: . <5.7%       Consistent with the absence of diabetes 5.7-6.4%    Consistent with increased risk for diabetes             (prediabetes) > or =6.5%  Consistent with diabetes . This assay result is consistent with a decreased risk of diabetes. . Currently, no consensus exists regarding use of hemoglobin A1c for diagnosis of diabetes in children. . According to American Diabetes Association (ADA) guidelines, hemoglobin A1c <7.0% represents optimal control in non-pregnant diabetic  patients. Different metrics may apply to specific patient populations.  Standards of Medical Care in Diabetes(ADA). .     CBG: No results for input(s): "GLUCAP" in the last 168 hours.    Kara Mead MD. Shade Flood.  Pulmonary & Critical care Pager : [phone]  If no response to pager , please call [phone] until 7 pm After 7:00 pm call Elink  [phone]    06/13/2022, 11:35 AM

## 2022-06-13 NOTE — Progress Notes (Signed)
Physical Therapy Treatment Patient Details Name: Jillian Shepherd MRN: [id number] DOB: [birth year] Today's Date: 06/13/2022   History of Present Illness 36 year old Caucasian female admitted 10/26 with acute alcoholic pancreatitis and splenic hematoma.  SIRS. Chest tube placed as well 11/5.   PMH: alcohol use was admitted earlier to the hospital this month for acute alcoholic pancreatitis.    PT Comments    Pt agreeable to getting up out of bed and walking, reporting increased back pain at chest tube site. Pt is limited in safe mobility by chest tube and associated pain, decreased strength and endurance. Pt is currently mod I for bed mobility, supervision for transfers and min guard for ambulation in hallway. Pt encouraged by being able to progress ambulation distance. D/c plan remains appropriate. PT will continue to follow acutely.   Recommendations for follow up therapy are one component of a multi-disciplinary discharge planning process, led by the attending physician.  Recommendations may be updated based on patient status, additional functional criteria and insurance authorization.  Follow Up Recommendations  Outpatient PT     Assistance Recommended at Discharge Set up Supervision/Assistance  Patient can return home with the following A little help with walking and/or transfers;Assistance with cooking/housework;Help with stairs or ramp for entrance;Assist for transportation   Equipment Recommendations  None recommended by PT       Precautions / Restrictions Precautions Precaution Comments: Chest tube Restrictions Weight Bearing Restrictions: No     Mobility  Bed Mobility Overal bed mobility: Modified Independent             General bed mobility comments: utilized bed railings to transition to sitting on EOB.    Transfers Overall transfer level: Needs assistance Equipment used: None Transfers: Sit to/from Stand, Bed to chair/wheelchair/BSC Sit to Stand: Supervision            General transfer comment: assist for line management    Ambulation/Gait Ambulation/Gait assistance: Supervision, Min guard Gait Distance (Feet): 500 Feet Assistive device: IV Pole Gait Pattern/deviations: Step-through pattern, Decreased stride length Gait velocity: slowed Gait velocity interpretation: <1.8 ft/sec, indicate of risk for recurrent falls   General Gait Details: slowed, steady gait, appreciative of walking off unit       Balance Overall balance assessment: No apparent balance deficits (not formally assessed)                                          Cognition Arousal/Alertness: Awake/alert Behavior During Therapy: WFL for tasks assessed/performed Overall Cognitive Status: Within Functional Limits for tasks assessed                                             General Comments General comments (skin integrity, edema, etc.): HR increased to 130 bpm with ambulation, maintains SpO2 >96%O2 with 3L O2 via [place]      Pertinent Vitals/Pain Pain Assessment Pain Assessment: Faces Pain Score: 4  Breathing: normal Negative Vocalization: none Facial Expression: smiling or inexpressive Body Language: relaxed Consolability: no need to console PAINAD Score: 0 Pain Location: left chest tube site, improved with ambulation Pain Descriptors / Indicators: Grimacing, Discomfort Pain Intervention(s): Limited activity within patient's tolerance, Monitored during session, Repositioned     PT Goals (current goals can now be found in the care plan section) Acute  Rehab PT Goals Patient Stated Goal: to gohome PT Goal Formulation: With patient Time For Goal Achievement: 06/25/22 Potential to Achieve Goals: Good Progress towards PT goals: Progressing toward goals    Frequency    Min 3X/week      PT Plan Current plan remains appropriate       AM-PAC PT "6 Clicks" Mobility   Outcome Measure  Help needed turning from your  back to your side while in a flat bed without using bedrails?: None Help needed moving from lying on your back to sitting on the side of a flat bed without using bedrails?: A Little Help needed moving to and from a bed to a chair (including a wheelchair)?: A Little Help needed standing up from a chair using your arms (e.g., wheelchair or bedside chair)?: A Little Help needed to walk in hospital room?: A Little Help needed climbing 3-5 steps with a railing? : A Lot 6 Click Score: 18    End of Session Equipment Utilized During Treatment: Gait belt;Oxygen Activity Tolerance: Patient tolerated treatment well Patient left: in chair;with call bell/phone within reach Nurse Communication: Mobility status PT Visit Diagnosis: Muscle weakness (generalized) (M62.81)     Time: 7290-2111 PT Time Calculation (min) (ACUTE ONLY): 28 min  Charges:  $Therapeutic Exercise: 23-37 mins                     Alexine Pilant B. Migdalia Dk PT, DPT Acute Rehabilitation Services Please use secure chat or  Call Office [phone]    [place] 06/13/2022, 4:28 PM

## 2022-06-13 NOTE — Plan of Care (Signed)

## 2022-06-14 LAB — COMPREHENSIVE METABOLIC PANEL
ALT: 7 U/L (ref 0–44)
AST: 14 U/L — ABNORMAL LOW (ref 15–41)
Albumin: 1.8 g/dL — ABNORMAL LOW (ref 3.5–5.0)
Alkaline Phosphatase: 85 U/L (ref 38–126)
Anion gap: 10 (ref 5–15)
BUN: 7 mg/dL (ref 6–20)
CO2: 29 mmol/L (ref 22–32)
Calcium: 8.6 mg/dL — ABNORMAL LOW (ref 8.9–10.3)
Chloride: 95 mmol/L — ABNORMAL LOW (ref 98–111)
Creatinine, Ser: 0.33 mg/dL — ABNORMAL LOW (ref 0.44–1.00)
GFR, Estimated: >60 mL/min (ref >60)
Glucose, Bld: 104 mg/dL — ABNORMAL HIGH (ref 70–99)
Potassium: 3 mmol/L — ABNORMAL LOW (ref 3.5–5.1)
Sodium: 134 mmol/L — ABNORMAL LOW (ref 135–145)
Total Bilirubin: 0.8 mg/dL (ref 0.3–1.2)
Total Protein: 6.5 g/dL (ref 6.5–8.1)

## 2022-06-14 LAB — CBC
HCT: 26.9 % — ABNORMAL LOW (ref 36.0–46.0)
Hemoglobin: 8.9 g/dL — ABNORMAL LOW (ref 12.0–15.0)
MCH: 33.7 pg (ref 26.0–34.0)
MCHC: 33.1 g/dL (ref 30.0–36.0)
MCV: 101.9 fL — ABNORMAL HIGH (ref 80.0–100.0)
Platelets: 679 10*3/uL — ABNORMAL HIGH (ref 150–400)
RBC: 2.64 MIL/uL — ABNORMAL LOW (ref 3.87–5.11)
RDW: 17.2 % — ABNORMAL HIGH (ref 11.5–15.5)
WBC: 16.1 10*3/uL — ABNORMAL HIGH (ref 4.0–10.5)
nRBC: 0.1 % (ref 0.0–0.2)

## 2022-06-14 LAB — AMYLASE, BODY FLUID (OTHER): Amylase, Body Fluid: 219 U/L

## 2022-06-14 LAB — CULTURE, BODY FLUID W GRAM STAIN -BOTTLE: Culture: NO GROWTH

## 2022-06-14 MED ORDER — OXYCODONE HCL 5 MG PO TABS: 5.0000 mg | ORAL_TABLET | Freq: Four times a day (QID) | ORAL | 0 refills | Status: DC | PRN

## 2022-06-14 MED ORDER — POTASSIUM CHLORIDE CRYS ER 20 MEQ PO TBCR
60.0000 meq | EXTENDED_RELEASE_TABLET | Freq: Two times a day (BID) | ORAL | Status: DC
  Administered 2022-06-14: 60 meq via ORAL
  Filled 2022-06-14: qty 3

## 2022-06-14 MED ORDER — LEVOFLOXACIN 500 MG PO TABS: 500.0000 mg | ORAL_TABLET | Freq: Every day | ORAL | 0 refills | Status: DC

## 2022-06-14 NOTE — Discharge Summary (Signed)
Physician Discharge Summary   Patient: Jillian Shepherd MRN: [id number] DOB: 01/19/1986  Admit date:     05/31/2022  Discharge date: 06/14/22  Discharge Physician: Patrecia Pour   PCP: [name], MD   Recommendations at discharge:  Follow up with PCP in 1-2 weeks with recheck CMP, CBC.  Recommend repeat CT abd/pelvis to monitor splenic hematoma. Consider reinitiation of anticoagulation for splenic vein thrombosis if regressing. Consider GI evaluation as outpatient, checking fecal elastase.  Discharge Diagnoses: Principal Problem:   Acute pancreatitis Active Problems:   Essential hypertension   Diarrhea   Splenic vein thrombosis   Liddle's syndrome   Spleen hematoma   Acute hypoxic respiratory failure (HCC)   Pleural effusion on left   HCAP (healthcare-associated pneumonia)   Pancreatic pseudocyst   Malnutrition of moderate degree  Hospital Course: Jillian Shepherd is a 36 y.o. female with a history of EtOH use, recent acute alcoholic pancreatitis admission who presented to the ED on 10/26 with abdominal pain found to have persistent pancreatitis on CT with splenic vein thrombosis. Anticoagulation was given, though with increasing pain, CT was repeated showing developing pseudocyst and splenic hematoma for which anticoagulation was stopped, Kcentra given. Surgery was consulted, recommended transfer [facility] > [facility], though they do not recommend any operative management. IR was consulted, felt the patient was not candidate for splenic vein thrombectomy and did not end up requiring embolization with stabilized blood counts and improved pain. GI was consulted and followed the patient's clinical course. Diet has been initiated and advanced slowly with conversion of IV pain medications to oral on 11/8. She has maintained adequate nutrition and hydration since that time on a low fat diet.    She developed shortness of breath. CT on 11/3 showing moderate left pleural effusion with atelectasis.  Thoracentesis 11/4 with effusion exudative by Light's criteria, and the patient's respiratory status worsened with CXR showing reaccumulation appearing loculated. Chest tube placed and antibiotics started for possible HAP 11/5. Chest tube discontinued 11/8 and the patient's hypoxia has resolved.   Assessment and Plan: Acute recurrent alcoholic pancreatitis with pseudocyst:  - After PDMP review, oxycodone prescribed to facilitate discharge home. Follow up with PCP and/or GI after discharge.    Splenic vein thrombosis > hematoma: No IR or surgical management required.  - Hold anticoagulation for at least 2 weeks at which point a CT can be repeated with consideration for reinitiation of anticoagulation if H/H and imaging show stability/improvement.    Acute hypoxic respiratory failure: Due to pleural effusion, suspected parapneumonic with neutrophilic predominance. Negative gram stain and culture to date. - Continue antibiotics for HAP coverage, MRSA negative. Recently admitted and on omnicef + doxycycline. Concern for pseudomonas, so will have to Rx levaquin to complete a 7 day course. Discussed possible side effects with patient. - No longer hypoxemic, even with ambulation. Fluid decreased based on exam.    Liddle's syndrome: Stable - Continue standing K supp and home MRA.   Hypokalemia, hypomagnesemia:  - Continue standing supplementation.   HTN:  - Continue home medications   Hypoalbuminemia, moderate protein-calorie malnutrition:  - Supplement protein   Loose stool and now constipated -Likely chronic for her loose stool in the setting of her pancreatic insufficiency -Had a colonoscopy which showed no evidence of microscopic colitis or celiac disease in the work-up in 2022 -GI recommending obtaining fecal elastase in outpatient setting   Obesity: Body mass index is 31.91 kg/m.   Pain control - [employer] Controlled Substance Reporting System database  was reviewed. and patient  was instructed, not to drive, operate heavy machinery, perform activities at heights, swimming or participation in water activities or provide baby-sitting services while on Pain, Sleep and Anxiety Medications; until their outpatient Physician has advised to do so again. Also recommended to not to take more than prescribed Pain, Sleep and Anxiety Medications.  Consultants: GI, surgery, PCCM Procedures performed: Thoracentesis, chest tube  Disposition: Home Diet recommendation: Low fat DISCHARGE MEDICATION: Allergies as of 06/14/2022   No Known Allergies      Medication List     STOP taking these medications    cefdinir 300 MG capsule Commonly known as: OMNICEF   doxycycline 100 MG tablet Commonly known as: VIBRA-TABS   oxycodone 5 MG capsule Commonly known as: OXY-IR Replaced by: oxyCODONE 5 MG immediate release tablet       TAKE these medications    amLODipine 10 MG tablet Commonly known as: NORVASC Take 1 tablet (10 mg total) by mouth daily.   cyanocobalamin 1000 MCG/ML injection Commonly known as: VITAMIN B12 Inject 1 mL (1,000 mcg total) into the muscle every 30 (thirty) days.   folic acid 1 MG tablet Commonly known as: FOLVITE Take 1 tablet (1 mg total) by mouth daily.   levofloxacin 500 MG tablet Commonly known as: Levaquin Take 1 tablet (500 mg total) by mouth daily.   metFORMIN 500 MG tablet Commonly known as: GLUCOPHAGE Take 1 tablet (500 mg total) by mouth 2 (two) times daily with a meal.   oxyCODONE 5 MG immediate release tablet Commonly known as: Oxy IR/ROXICODONE Take 1-2 tablets (5-10 mg total) by mouth every 6 (six) hours as needed for up to 5 days for severe pain or moderate pain. Replaces: oxycodone 5 MG capsule   potassium chloride SA 20 MEQ tablet Commonly known as: Klor-Con M20 Take 3 tablets (60 mEq total) by mouth daily. What changed: how much to take   spironolactone 25 MG tablet Commonly known as: ALDACTONE Take 1 tablet (25 mg  total) by mouth daily. What changed: when to take this        Follow-up Information     [name], MD Follow up.   Specialty: Family Medicine Contact information: [address] [place] [address] [phone]                Discharge Exam: Danley Danker Weights   05/31/22 0824 06/08/22 1454  Weight: 83.6 kg 81.7 kg  No distress, well-appearing Clear bilaterally with diminished sounds at left base. Nonlabored RRR, no JVD or edema Soft, modestly tender to palpation in epigastrium with active bowel sounds, no distention or rebound or masses  Condition at discharge: stable  The results of significant diagnostics from this hospitalization (including imaging, microbiology, ancillary and laboratory) are listed below for reference.   Imaging Studies: DG CHEST PORT 1 VIEW  Result Date: 06/13/2022 CLINICAL DATA:  Left pleural effusion vision. EXAM: PORTABLE CHEST 1 VIEW COMPARISON:  Radiograph earlier today. FINDINGS: The previous pigtail catheter in the left hemithorax is no longer seen. Similar size left pleural effusion and basilar opacity from earlier today. No visualized pneumothorax. Overall low lung volumes without focal right lung airspace disease. Stable heart size and mediastinal contours. IMPRESSION: Removal of pigtail catheter from the left hemithorax. Similar size left pleural effusion and basilar opacity from earlier today. No visualized pneumothorax. Electronically Signed   By: Keith Rake M.D.   On: 06/13/2022 15:54   DG Chest [facility] 1 View  Result  Date: 06/13/2022 CLINICAL DATA:  Chest tube EXAM: PORTABLE CHEST 1 VIEW COMPARISON:  Portable exam 0632 hours compared to 06/12/2022 FINDINGS: Pigtail LEFT thoracostomy tube again seen. Enlargement of cardiac silhouette. Mediastinal contours and pulmonary vascularity normal. Persistent LEFT pleural effusion and bibasilar atelectasis. Persistent dense opacity of LEFT lower lobe may represent atelectasis or  pneumonia. No pneumothorax or acute osseous findings. IMPRESSION: Bibasilar atelectasis and LEFT pleural effusion with persistent LEFT lower lobe opacity, question atelectasis versus pneumonia. Electronically Signed   By: Lavonia Dana M.D.   On: 06/13/2022 08:47   DG CHEST PORT 1 VIEW  Result Date: 06/12/2022 CLINICAL DATA:  Pleural effusion, chest tube EXAM: PORTABLE CHEST 1 VIEW COMPARISON:  Portable exam 0640 hours compared to 06/11/2022 FINDINGS: Pigtail LEFT thoracostomy tube unchanged. Enlargement of cardiac silhouette. Mediastinal contour stable. Small LEFT pleural effusion and basilar atelectasis. No definite acute infiltrate, pneumothorax or acute osseous findings. IMPRESSION: Enlargement of cardiac silhouette. Persistent LEFT pleural effusion and basilar atelectasis despite pigtail thoracostomy tube. Electronically Signed   By: Lavonia Dana M.D.   On: 06/12/2022 10:00   DG CHEST PORT 1 VIEW  Result Date: 06/11/2022 CLINICAL DATA:  Left-sided chest tube. EXAM: PORTABLE CHEST 1 VIEW COMPARISON:  June 10, 2022. FINDINGS: Stable cardiomegaly. Stable left-sided chest tube is noted no definite pneumothorax is noted currently. Left pleural effusion and left basilar atelectasis is again noted. IMPRESSION: Stable position of left-sided chest tube with possible kink within it. No definite pneumothorax is noted. Stable left basilar opacity. Electronically Signed   By: Marijo Conception M.D.   On: 06/11/2022 08:16   DG Chest Port 1 View  Result Date: 06/10/2022 CLINICAL DATA:  Left chest tube placement EXAM: PORTABLE CHEST 1 VIEW COMPARISON:  Previous studies including the examination done earlier today FINDINGS: Transverse diameter of heart is increased. There is significant interval decrease in left pleural effusion after placement of left chest tube. There is a kink in the distal course of the left chest tube approximately 7 cm from the tip. There is possible trace amount of pneumothorax in the lateral  aspect of left upper lung field. There is possible tiny left apical pneumothorax. Residual increased density in left lower lung fields suggest possible underlying atelectasis/pneumonia. There is increase in patchy infiltrate in right upper lung fields. IMPRESSION: There is marked interval decrease in left pleural effusion after placement of left chest tube. Residual increased density in left lower lung fields may be due to pleural effusion and underlying atelectasis/pneumonia. A kink is noted in the distal course of left chest tube. Possible tiny pneumothorax is noted in the left apex and lateral aspect of left upper lung field. There is interval increase in patchy infiltrate in right upper lung field suggesting worsening of pneumonia. Electronically Signed   By: Elmer Picker M.D.   On: 06/10/2022 11:57   DG Abd 1 View  Result Date: 06/10/2022 CLINICAL DATA:  Abdominal pain and distension EXAM: ABDOMEN - 1 VIEW COMPARISON:  None Available. FINDINGS: Scattered large and small bowel gas is noted. No free air is seen. Increasing left-sided effusion is noted when compare with the prior chest exam. No acute bony abnormality is seen. IMPRESSION: Unremarkable abdomen. Increasing left-sided pleural effusion. Electronically Signed   By: Inez Catalina M.D.   On: 06/10/2022 09:56   DG CHEST PORT 1 VIEW  Result Date: 06/10/2022 CLINICAL DATA:  Shortness of breath EXAM: PORTABLE CHEST 1 VIEW COMPARISON:  06/09/2022 FINDINGS: Cardiac shadow is enlarged but stable. Increasing left-sided  pleural effusion is noted when compare with the prior exam. Persistent and slightly increased airspace opacity in the right apex is noted. No bony abnormality is noted. IMPRESSION: Increasing left-sided effusion compared with the prior exam. Increasing right apical airspace opacity. Electronically Signed   By: Inez Catalina M.D.   On: 06/10/2022 09:55   DG CHEST PORT 1 VIEW  Result Date: 06/09/2022 CLINICAL DATA:  Post left-sided  thoracentesis. EXAM: PORTABLE CHEST 1 VIEW COMPARISON:  CT abdomen and pelvis-06/08/2022; chest radiograph-06/01/2022 FINDINGS: Moderate-sized potentially partially loculated residual left-sided pleural effusion post thoracentesis. No pneumothorax. There is obscuration of the left heart border secondary to left-sided effusion associated left mid and lower lung heterogeneous/consolidative opacities. Trace right-sided pleural effusion is not excluded. Pulmonary vasculature is indistinct with cephalization of flow. No acute osseous abnormalities. IMPRESSION: 1. Moderate-sized potentially partially loculated residual left-sided effusion post thoracentesis. No pneumothorax. 2. Left mid and lower lung heterogeneous/consolidative opacities, potentially atelectasis though worrisome for multifocal infection. 3. Suspected trace right-sided effusion with suspected mild pulmonary edema. Electronically Signed   By: Sandi Mariscal M.D.   On: 06/09/2022 11:13   US THORACENTESIS ASP PLEURAL SPACE W/IMG GUIDE  Result Date: 06/09/2022 INDICATION: Left pleural effusion, shortness of breath EXAM: ULTRASOUND GUIDED LEFT THORACENTESIS MEDICATIONS: None. COMPLICATIONS: None immediate. PROCEDURE: An ultrasound guided thoracentesis was thoroughly discussed with the patient and questions answered. The benefits, risks, alternatives and complications were also discussed. The patient understands and wishes to proceed with the procedure. Written consent was obtained. Ultrasound was performed to localize and mark an adequate pocket of fluid in the left chest. The area was then prepped and draped in the normal sterile fashion. 1% Lidocaine was used for local anesthesia. Under ultrasound guidance a 6 Fr Safe-T-Centesis catheter was introduced. Thoracentesis was performed. The catheter was removed and a dressing applied. FINDINGS: A total of approximately 500 cc of pleural fluid was removed. Samples were sent to the laboratory as requested by the  clinical team. IMPRESSION: Successful ultrasound guided left thoracentesis yielding 500 cc of pleural fluid. Performed and dictated by Pasty Spillers, PA-C Electronically Signed   By: Sandi Mariscal M.D.   On: 06/09/2022 11:09   CT ABDOMEN PELVIS W CONTRAST  Result Date: 06/08/2022 CLINICAL DATA:  Nausea, vomiting, epigastric pain, splenic vein thrombosis, pancreatic necrosis, leukocytosis EXAM: CT ABDOMEN AND PELVIS WITH CONTRAST TECHNIQUE: Multidetector CT imaging of the abdomen and pelvis was performed using the standard protocol following bolus administration of intravenous contrast. RADIATION DOSE REDUCTION: This exam was performed according to the departmental dose-optimization program which includes automated exposure control, adjustment of the mA and/or kV according to patient size and/or use of iterative reconstruction technique. CONTRAST:  168m OMNIPAQUE IOHEXOL 300 MG/ML  SOLN COMPARISON:  05/31/2022 FINDINGS: Lower chest: New moderate left pleural effusion and consolidation/atelectasis in the visualized dependent aspect of the left lung. Trace right pleural fluid. No pericardial effusion. Hepatobiliary: Gallbladder is physiologically distended without calcified stones. No focal liver lesion or biliary ductal dilatation. Pancreas: Perhaps marginal improvement in the peripancreatic inflammatory change. Peripancreatic pseudocysts with dominant cluster near the body/tail junction are slightly better marginated, similar in size. There is a new well-marginated pseudocyst abutting the lateral wall of gastric body, 4.4 x 2.7 cm. Pancreatic parenchymal enhancement stable. Spleen: Interval development of large mixed-attenuation perisplenic or subcapsular hematoma , margins difficult to differentiate from the spleen itself, the overall process measuring 14 x 10.2 x 9.9 cm (740cc) , compared to original spleen dimensions 11.8 x 4.1 x7.4 cm (187cc) on  prior study. No convincing active extravasation on the single  phase of the scan. Adrenals/Urinary Tract: No adrenal mass. Symmetric renal enhancement without focal lesion or hydronephrosis. Urinary bladder physiologically distended. Stomach/Bowel: Stomach is incompletely distended, with mucosal enhancement. The small bowel is decompressed. Normal appendix. Colon is incompletely distended, unremarkable. Vascular/Lymphatic: No significant arterial pathology evident. Portal vein patent. Incomplete opacification of the splenic vein consistent with previously described splenic vein thrombosis. SMV is patent. Bilateral renal veins patent. No abdominal or pelvic adenopathy localized. Reproductive: Uterus and bilateral adnexa are unremarkable. Other: Small volume pelvic ascites. Infiltrative changes in the right upper quadrant and perisplenic mesenteric and retroperitoneal fat may be related to pancreatitis or the splenic hemorrhage. Musculoskeletal: No acute or significant osseous findings. IMPRESSION: 1. Interval development of 500+mL mixed-attenuation perisplenic/subcapsular splenic hematoma, without evident active extravasation. 2. New 4.4 cm pseudocyst abutting the lateral wall of the gastric body. 3. New moderate left pleural effusion and consolidation/atelectasis in the visualized dependent aspect of the left lung. 4. Some improvement in peripancreatic inflammatory change with stable peripancreatic pseudocysts. 5. Small volume pelvic ascites. 6. Incomplete opacification of the splenic vein consistent with previously described splenic vein thrombosis. Electronically Signed   By: Lucrezia Europe M.D.   On: 06/08/2022 13:32   DG CHEST PORT 1 VIEW  Result Date: 06/01/2022 CLINICAL DATA:  Chest pain EXAM: PORTABLE CHEST 1 VIEW COMPARISON:  Chest radiograph 05/09/2022, CTA chest 05/11/2022 FINDINGS: The cardiomediastinal silhouette is stable. Lung volumes are low. There is small left pleural effusion with adjacent retrocardiac opacity which may reflect atelectasis or pneumonia,  likely similar to the CTA from 05/11/2022 but new since the radiograph from 05/09/2022. The left upper lobe is well-aerated. The right lung is clear. There is no right pleural effusion. There is no acute osseous abnormality. IMPRESSION: Small left pleural effusion and adjacent retrocardiac opacity which may reflect atelectasis or pneumonia, likely similar to the CTA chest from 05/11/2022 but new since 05/09/2022. Electronically Signed   By: Valetta Mole M.D.   On: 06/01/2022 12:56   CT ABDOMEN PELVIS W CONTRAST  Result Date: 05/31/2022 CLINICAL DATA:  LEFT side abdominal pain since 0300 hours, acid reflux symptoms yesterday, progressively worsening pain moving to LEFT side of abdomen and radiating to chest and shoulder, associated nausea and diarrhea. History collagen vascular disease, hypertension, pancreatitis, smoking EXAM: CT ABDOMEN AND PELVIS WITH CONTRAST TECHNIQUE: Multidetector CT imaging of the abdomen and pelvis was performed using the standard protocol following bolus administration of intravenous contrast. RADIATION DOSE REDUCTION: This exam was performed according to the departmental dose-optimization program which includes automated exposure control, adjustment of the mA and/or kV according to patient size and/or use of iterative reconstruction technique. CONTRAST:  185m OMNIPAQUE IOHEXOL 300 MG/ML SOLN IV. No oral contrast. COMPARISON:  05/09/2022 FINDINGS: Lower chest: Lung bases clear Hepatobiliary: Gallbladder and liver normal appearance Pancreas: Enlarged edematous pancreas with diffuse infiltration of peripancreatic tissue planes consistent with acute pancreatitis, also seen on prior exam. New area of low attenuation within pancreatic tail however, approximately 3.0 x 2.6 cm, either pancreatic necrosis versus developing cystic lesion. Increased edema adjacent to stomach and spleen. Small amount of fluid in lesser sac. No pancreatic calcifications or ductal dilatation. Spleen: Normal  appearance Adrenals/Urinary Tract: Adrenal glands, kidneys, ureters, and bladder normal appearance Stomach/Bowel: Normal appendix. Stomach and bowel loops otherwise unremarkable. Vascular/Lymphatic: Interval splenic vein thrombosis. Remaining vascular structures patent. Aorta normal caliber. No adenopathy. Reproductive: Unremarkable uterus and ovaries Other: Small LEFT inguinal hernia containing fat. Small  amount of free fluid in pelvis. No free air. Musculoskeletal: Unremarkable IMPRESSION: Acute pancreatitis with increased surrounding peripancreatic and LEFT upper quadrant edema New area of low attenuation within pancreatic tail approximately 3.0 x 2.6 cm, either representing pancreatic necrosis or developing cystic lesion. Interval splenic vein thrombosis. Small LEFT inguinal hernia containing fat. Electronically Signed   By: Lavonia Dana M.D.   On: 05/31/2022 10:16    Microbiology: Results for orders placed or performed during the hospital encounter of 05/31/22  MRSA Next Gen by PCR, Nasal     Status: None   Collection Time: 06/08/22  2:34 PM   Specimen: Nasal Mucosa; Nasal Swab  Result Value Ref Range Status   MRSA by PCR Next Gen NOT DETECTED NOT DETECTED Final    Comment: (NOTE) The GeneXpert MRSA Assay (FDA approved for NASAL specimens only), is one component of a comprehensive MRSA colonization surveillance program. It is not intended to diagnose MRSA infection nor to guide or monitor treatment for MRSA infections. Test performance is not FDA approved in patients less than 42 years old. Performed at [facility], [address] [place], [place] [address]   Culture, body fluid w Gram Stain-bottle     Status: None   Collection Time: 06/09/22 10:56 AM   Specimen: Pleura  Result Value Ref Range Status   Specimen Description PLEURAL  Final   Special Requests NONE  Final   Culture   Final    NO GROWTH 5 DAYS Performed at [facility] [address], [place], [place] [address]     Report Status 06/14/2022 FINAL  Final  Gram stain     Status: None   Collection Time: 06/09/22 10:56 AM   Specimen: Pleura  Result Value Ref Range Status   Specimen Description PLEURAL  Final   Special Requests NONE  Final   Gram Stain   Final    FEW WBC PRESENT,BOTH PMN AND MONONUCLEAR NO ORGANISMS SEEN Performed at [facility], [address] [place], [place] [address]    Report Status 06/09/2022 FINAL  Final  Fungus Culture Result     Status: None   Collection Time: 06/09/22 11:02 AM  Result Value Ref Range Status   Result 1 Comment  Final    Comment: (NOTE) KOH/Calcofluor preparation:  no fungus observed. Performed At: [facility] [place], [place] [id number] Rush Farmer MD VZ:[id number]     Labs: CBC: Recent Labs  Lab 06/10/22 0539 06/10/22 0911 06/11/22 0433 06/11/22 0916 06/12/22 0541 06/12/22 1002 06/12/22 1558 06/12/22 2214 06/13/22 0309 06/14/22 0226  WBC 21.1*  --  16.9*  --  18.2*  --   --   --  17.7* 16.1*  NEUTROABS 17.5*  --  13.4*  --  14.6*  --   --   --  13.5*  --   HGB 9.8*   < > 9.2*   < > 8.5* 9.1* 9.6* 10.4* 9.2* 8.9*  HCT 27.3*   < > 26.0*   < > 24.0* 25.7* 27.2* 29.7* 27.0* 26.9*  MCV 100.0  --  100.4*  --  100.0  --   --   --  100.0 101.9*  PLT 482*  --  514*  --  506*  --   --   --  668* 679*   < > = values in this interval not displayed.   Basic Metabolic Panel: Recent Labs  Lab 06/09/22 0145 06/09/22 0558 06/10/22 0539 06/11/22 0433 06/12/22 0541  06/13/22 0309 06/14/22 0226  NA 138 134* 134* 134* 133* 135 134*  K 4.4 4.4 4.1 3.6 3.4* 2.9* 3.0*  CL 96* 97* 97* 96* 97* 93* 95*  CO2 '27 27 27 27 27 29 29  '$ GLUCOSE 144* 133* 136* 111* 112* 117* 104*  BUN '8 10 13 '$ 21* '12 9 7  '$ CREATININE 0.51 0.47 0.34* 0.55 0.40* 0.35* 0.33*  CALCIUM 9.1 8.4* 8.5* 8.5* 8.3* 8.9 8.6*  MG 1.4* 1.4* 2.1 2.1 1.8 1.8  --   PHOS 4.3  --  3.3 3.9 3.2 3.1  --    Liver Function Tests: Recent Labs  Lab 06/10/22 0539  06/11/22 0433 06/12/22 0541 06/13/22 0309 06/14/22 0226  AST 12* 12* 14* 14* 14*  ALT '8 8 6 7 7  '$ ALKPHOS 100 88 88 97 85  BILITOT 0.7 1.2 1.2 1.0 0.8  PROT 6.4* 6.4* 6.5 6.2* 6.5  ALBUMIN 2.0* 1.8* 1.7* 1.6* 1.8*   CBG: No results for input(s): "GLUCAP" in the last 168 hours.  Discharge time spent: greater than 30 minutes.  Signed: Patrecia Pour, MD [facility] 06/14/2022

## 2022-06-14 NOTE — Progress Notes (Signed)
Nurse requested Mobility Specialist to perform oxygen saturation test with pt which includes removing pt from oxygen both at rest and while ambulating.  Below are the results from that testing.     Patient Saturations on Room Air at Rest = spO2 92%  Patient Saturations on Room Air while Ambulating = sp02 88% .  Rested and performed pursed lip breathing for 1 minute with sp02 at 93%.  Patient Saturations on 0 Liters of oxygen while Ambulating = sp02 94%  At end of testing pt left in room on 0  Liters of oxygen.  Reported results to nurse.

## 2022-06-14 NOTE — Progress Notes (Signed)
Mobility Specialist - Progress Note   06/14/22 1226  Mobility  Activity Ambulated with assistance in hallway  Level of Assistance Modified independent, requires aide device or extra time  Assistive Device None  Distance Ambulated (ft) 350 ft  Activity Response Tolerated well  Mobility Referral Yes  $Mobility charge 1 Mobility   Pt received in room and agreeable to mobility. No complaints throughout. Pt was returned to EOB with all needs met.      Mobility Specialist Please contact via SecureChat or Rehab office at [phone]  

## 2022-06-14 NOTE — TOC Initial Note (Signed)
Transition of Care [facility]) - Initial/Assessment Note    Patient Details  Name: Jillian Shepherd MRN: [id number] Date of Birth: June 08, 1986  Transition of Care [facility]) CM/SW Contact:    Bethena Roys, RN Phone Number: 06/14/2022, 12:35 PM  Clinical Narrative: Patient presented for abdominal pain. PTA patient was from home with spouse. Plan will be to return home. Case Manager discussed the Outpatient Physical Therapy recommendation and patient declined services. Patient states she did well ambulating today. Case Manager did educate the patient on if she needs outpatient services in the future to call her PCP. Patient reports that she has transportation home. No further needs identified at this time.                Expected Discharge Plan: Home/Self Care Barriers to Discharge: No Barriers Identified   Patient Goals and CMS Choice Patient states their goals for this hospitalization and ongoing recovery are:: to return home.   Choice offered to / list presented to : NA  Expected Discharge Plan and Services Expected Discharge Plan: Home/Self Care In-house Referral: NA Discharge Planning Services: CM Consult Post Acute Care Choice: NA Living arrangements for the past 2 months: Single Family Home   Prior Living Arrangements/Services Living arrangements for the past 2 months: Single Family Home Lives with:: Spouse Patient language and need for interpreter reviewed:: Yes Do you feel safe going back to the place where you live?: Yes      Need for Family Participation in Patient Care: Yes (Comment) Care giver support system in place?: Yes (comment)   Criminal Activity/Legal Involvement Pertinent to Current Situation/Hospitalization: No - Comment as needed  Activities of Daily Living Home Assistive Devices/Equipment: None ADL Screening (condition at time of admission) Patient's cognitive ability adequate to safely complete daily activities?: Yes Is the patient deaf or have  difficulty hearing?: No Does the patient have difficulty seeing, even when wearing glasses/contacts?: No Does the patient have difficulty concentrating, remembering, or making decisions?: No Patient able to express need for assistance with ADLs?: Yes Does the patient have difficulty dressing or bathing?: No Independently performs ADLs?: Yes (appropriate for developmental age) Does the patient have difficulty walking or climbing stairs?: No Weakness of Legs: None Weakness of Arms/Hands: None  Permission Sought/Granted Permission sought to share information with : Family Supports, Case Manager    Emotional Assessment Appearance:: Appears stated age Attitude/Demeanor/Rapport: Engaged Affect (typically observed): Appropriate Orientation: : Oriented to Self, Oriented to Place, Oriented to  Time, Oriented to Situation Alcohol / Substance Use: Not Applicable Psych Involvement: No (comment)  Admission diagnosis:  Acute pancreatitis [K85.90] Alcohol-induced acute pancreatitis, unspecified complication status [Y77.41] Patient Active Problem List   Diagnosis Date Noted   Malnutrition of moderate degree 06/12/2022   Pancreatic pseudocyst 06/11/2022   Acute hypoxic respiratory failure ([place]) 06/10/2022   Pleural effusion on left 06/10/2022   HCAP (healthcare-associated pneumonia) 06/10/2022   Spleen hematoma 06/08/2022   Acute pancreatitis 05/31/2022   Splenic vein thrombosis 05/31/2022   Liddle's syndrome 05/31/2022   Hypoxia 05/11/2022   Abdominal pain 05/10/2022   Nausea & vomiting 05/10/2022   Syncope 05/10/2022   Hypokalemia 05/10/2022   Hypomagnesemia 05/10/2022   Prolonged QT interval 05/10/2022   Elevated MCV 05/10/2022   Hyperglycemia 05/10/2022   Alcohol abuse 28/78/6767   Acute alcoholic pancreatitis 20/94/7096   Diarrhea    Pancreatitis, acute 07/27/2020   Sacral back pain 09/14/2019   Bilateral ovarian cysts 09/02/2018   Postpartum care following vaginal delivery  01/22/2015  Elevated blood pressure reading without diagnosis of hypertension 01/20/2015   Hypertension affecting pregnancy 01/20/2015   Elevated blood pressure affecting pregnancy in third trimester, antepartum 01/19/2015   Essential hypertension 07/07/2014   Depression 07/07/2014   Obesity 07/07/2014   PCP:  [name], MD Pharmacy[facility]   [id number] [place] [place] [id number]: [phone]: [phone] Readmission Risk Interventions     No data to display

## 2022-06-18 ENCOUNTER — Telehealth: Payer: Self-pay

## 2022-06-18 ENCOUNTER — Other Ambulatory Visit: Payer: Self-pay | Admitting: Family Medicine

## 2022-06-18 MED ORDER — OXYCODONE HCL 5 MG PO TABS: 5.0000 mg | ORAL_TABLET | Freq: Four times a day (QID) | ORAL | 0 refills | Status: AC | PRN

## 2022-06-18 NOTE — Telephone Encounter (Signed)
Pt has hospital f/u with you on Thursday 06/21/22, dx with acute pancreatitis. Pt states she is still having issues with pain and asks for a refill on Oxycodone until her appt on Thursday? Thank you.

## 2022-06-21 ENCOUNTER — Ambulatory Visit (INDEPENDENT_AMBULATORY_CARE_PROVIDER_SITE_OTHER): Payer: 59 | Admitting: Family Medicine

## 2022-06-21 VITALS — BP 120/80 | HR 104 | Ht 63.0 in | Wt 164.6 lb

## 2022-06-21 DIAGNOSIS — J90 Pleural effusion, not elsewhere classified: Secondary | ICD-10-CM | POA: Diagnosis not present

## 2022-06-21 DIAGNOSIS — I151 Hypertension secondary to other renal disorders: Secondary | ICD-10-CM

## 2022-06-21 DIAGNOSIS — I82890 Acute embolism and thrombosis of other specified veins: Secondary | ICD-10-CM

## 2022-06-21 DIAGNOSIS — K852 Alcohol induced acute pancreatitis without necrosis or infection: Secondary | ICD-10-CM

## 2022-06-21 DIAGNOSIS — D735 Infarction of spleen: Secondary | ICD-10-CM

## 2022-06-21 NOTE — Progress Notes (Signed)
Subjective:    Patient ID: Jillian Shepherd, female    DOB: Jul 02, 1986, 36 y.o.   MRN: [id number]  HPI Admit date:     05/31/2022  Discharge date: 06/14/22  Discharge Physician: Patrecia Pour    PCP: [name], MD    Recommendations at discharge:  Follow up with PCP in 1-2 weeks with recheck CMP, CBC.  Recommend repeat CT abd/pelvis to monitor splenic hematoma. Consider reinitiation of anticoagulation for splenic vein thrombosis if regressing. Consider GI evaluation as outpatient, checking fecal elastase.   Discharge Diagnoses: Principal Problem:   Acute pancreatitis Active Problems:   Essential hypertension   Diarrhea   Splenic vein thrombosis   Liddle's syndrome   Spleen hematoma   Acute hypoxic respiratory failure (HCC)   Pleural effusion on left   HCAP (healthcare-associated pneumonia)   Pancreatic pseudocyst   Malnutrition of moderate degree   Hospital Course: Jillian Shepherd is a 36 y.o. female with a history of EtOH use, recent acute alcoholic pancreatitis admission who presented to the ED on 10/26 with abdominal pain found to have persistent pancreatitis on CT with splenic vein thrombosis. Anticoagulation was given, though with increasing pain, CT was repeated showing developing pseudocyst and splenic hematoma for which anticoagulation was stopped, Kcentra given. Surgery was consulted, recommended transfer [facility] > [facility], though they do not recommend any operative management. IR was consulted, felt the patient was not candidate for splenic vein thrombectomy and did not end up requiring embolization with stabilized blood counts and improved pain. GI was consulted and followed the patient's clinical course. Diet has been initiated and advanced slowly with conversion of IV pain medications to oral on 11/8. She has maintained adequate nutrition and hydration since that time on a low fat diet.    She developed shortness of breath. CT on 11/3 showing moderate left pleural  effusion with atelectasis. Thoracentesis 11/4 with effusion exudative by Light's criteria, and the patient's respiratory status worsened with CXR showing reaccumulation appearing loculated. Chest tube placed and antibiotics started for possible HAP 11/5. Chest tube discontinued 11/8 and the patient's hypoxia has resolved.    Assessment and Plan: Acute recurrent alcoholic pancreatitis with pseudocyst:  - After PDMP review, oxycodone prescribed to facilitate discharge home. Follow up with PCP and/or GI after discharge.    Splenic vein thrombosis > hematoma: No IR or surgical management required.  - Hold anticoagulation for at least 2 weeks at which point a CT can be repeated with consideration for reinitiation of anticoagulation if H/H and imaging show stability/improvement.    Acute hypoxic respiratory failure: Due to pleural effusion, suspected parapneumonic with neutrophilic predominance. Negative gram stain and culture to date. - Continue antibiotics for HAP coverage, MRSA negative. Recently admitted and on omnicef + doxycycline. Concern for pseudomonas, so will have to Rx levaquin to complete a 7 day course. Discussed possible side effects with patient. - No longer hypoxemic, even with ambulation. Fluid decreased based on exam.    Liddle's syndrome: Stable - Continue standing K supp and home MRA.   Hypokalemia, hypomagnesemia:  - Continue standing supplementation.   HTN:  - Continue home medications   Hypoalbuminemia, moderate protein-calorie malnutrition:  - Supplement protein   Loose stool and now constipated -Likely chronic for her loose stool in the setting of her pancreatic insufficiency -Had a colonoscopy which showed no evidence of microscopic colitis or celiac disease in the work-up in 2022 -GI recommending obtaining fecal elastase in outpatient setting   06/21/22 Patient is here  today with her daughter.  She states that her abdominal pain has improved.  Her abdomen today is  soft nondistended nontender on exam.  She denies any shortness of breath however she has markedly diminished left posterior breath sounds.  Apparently this was the side that she had a large effusion that required thoracentesis and a chest tube.  Last chest x-ray on November 8 revealed left pleural effusion.  She denies any chest pain or pleurisy however she states that if she lays on her left side to sleep it causes significant pain.  She denies any diarrhea.  She denies any bloating.  She denies any vomiting.  She denies any melena or hematochezia.  She denies any fever.  At the present time she is no longer taking any anticoagulation due to splenic hematoma.  Past Medical History:  Diagnosis Date   Abdominal pain    Alcohol abuse    B12 deficiency    Cancer ([place])    squmaous cell skin cancer    Collagen vascular disease (HCC)    Hypokalemia    Liddle's syndrome    Obesity (BMI 35.0-39.9 without comorbidity)    Pancreatitis    Past Surgical History:  Procedure Laterality Date   ADENOIDECTOMY     COLONOSCOPY WITH PROPOFOL N/A 10/06/2020   Procedure: COLONOSCOPY WITH PROPOFOL;  Surgeon: Lin Landsman, MD;  Location: [facility]COPY;  Service: Gastroenterology;  Laterality: N/A;  COVID POSITIVE 08/23/2020   ESOPHAGOGASTRODUODENOSCOPY (EGD) WITH PROPOFOL N/A 10/06/2020   Procedure: ESOPHAGOGASTRODUODENOSCOPY (EGD) WITH PROPOFOL;  Surgeon: Lin Landsman, MD;  Location: [facility];  Service: Gastroenterology;  Laterality: N/A;   skin cancer removal Left    leg   TONSILLECTOMY     Current Outpatient Medications on File Prior to Visit  Medication Sig Dispense Refill   amLODipine (NORVASC) 10 MG tablet Take 1 tablet (10 mg total) by mouth daily. 90 tablet 3   cyanocobalamin (VITAMIN B12) 1000 MCG/ML injection Inject 1 mL (1,000 mcg total) into the muscle every 30 (thirty) days. 1 mL 11   folic acid (FOLVITE) 1 MG tablet Take 1 tablet (1 mg total) by mouth daily. 30 tablet 11    levofloxacin (LEVAQUIN) 500 MG tablet Take 1 tablet (500 mg total) by mouth daily. 3 tablet 0   metFORMIN (GLUCOPHAGE) 500 MG tablet Take 1 tablet (500 mg total) by mouth 2 (two) times daily with a meal. 180 tablet 3   oxyCODONE (OXY IR/ROXICODONE) 5 MG immediate release tablet Take 1-2 tablets (5-10 mg total) by mouth every 6 (six) hours as needed for up to 5 days for severe pain or moderate pain. 40 tablet 0   potassium chloride SA (KLOR-CON M20) 20 MEQ tablet Take 3 tablets (60 mEq total) by mouth daily. (Patient taking differently: Take 20 mEq by mouth daily.) 90 tablet 3   spironolactone (ALDACTONE) 25 MG tablet Take 1 tablet (25 mg total) by mouth daily. (Patient taking differently: Take 25 mg by mouth 2 (two) times daily.) 90 tablet 3   Current Facility-Administered Medications on File Prior to Visit  Medication Dose Route Frequency Provider Last Rate Last Admin   meclizine (ANTIVERT) tablet 25 mg  25 mg Oral Q8H Howard, Imagene Riches, FNP       No Known Allergies Social History   Socioeconomic History   Marital status: Married    Spouse name: Not on file   Number of children: Not on file   Years of education: Not on file   Highest education  level: Not on file  Occupational History   Not on file  Tobacco Use   Smoking status: Every Day    Packs/day: 0.25    Years: 9.00    Total pack years: 2.25    Types: Cigarettes   Smokeless tobacco: Never  Vaping Use   Vaping Use: Never used  Substance and Sexual Activity   Alcohol use: Yes    Alcohol/week: 2.0 standard drinks of alcohol    Types: 2 Cans of beer per week   Drug use: No   Sexual activity: Yes    Birth control/protection: None  Other Topics Concern   Not on file  Social History Narrative   ** Merged History Encounter **       Social Determinants of Health   Financial Resource Strain: Not on file  Food Insecurity: No Food Insecurity (05/31/2022)   Hunger Vital Sign    Worried About Running Out of Food in the Last  Year: Never true    Ran Out of Food in the Last Year: Never true  Transportation Needs: No Transportation Needs (05/31/2022)   PRAPARE - Hydrologist (Medical): No    Lack of Transportation (Non-Medical): No  Physical Activity: Not on file  Stress: Not on file  Social Connections: Not on file  Intimate Partner Violence: Not At Risk (05/31/2022)   Humiliation, Afraid, Rape, and Kick questionnaire    Fear of Current or Ex-Partner: No    Emotionally Abused: No    Physically Abused: No    Sexually Abused: No     Review of Systems  All other systems reviewed and are negative.      Objective:   Physical Exam Vitals reviewed.  Constitutional:      General: She is not in acute distress.    Appearance: Normal appearance. She is obese. She is not ill-appearing, toxic-appearing or diaphoretic.  HENT:     Nose: Nose normal. No congestion or rhinorrhea.     Mouth/Throat:     Pharynx: Oropharynx is clear. No oropharyngeal exudate or posterior oropharyngeal erythema.  Eyes:     General: No scleral icterus.    Conjunctiva/sclera: Conjunctivae normal.  Cardiovascular:     Rate and Rhythm: Normal rate and regular rhythm.     Pulses: Normal pulses.     Heart sounds: Normal heart sounds. No murmur heard.    No friction rub. No gallop.  Pulmonary:     Effort: Pulmonary effort is normal. No respiratory distress.     Breath sounds: Normal breath sounds. No stridor. No wheezing, rhonchi or rales.  Chest:     Chest wall: No tenderness.  Abdominal:     General: Abdomen is flat. Bowel sounds are normal. There is no distension.     Palpations: Abdomen is soft. There is no mass.     Tenderness: There is no right CVA tenderness, left CVA tenderness, guarding or rebound.     Hernia: No hernia is present.  Musculoskeletal:     Cervical back: Neck supple.  Neurological:     Mental Status: She is alert.           Assessment & Plan:  Pleural effusion, left -  Plan: DG Chest 2 View, CBC with Differential/Platelet, COMPLETE METABOLIC PANEL WITH GFR  Liddle's syndrome  Splenic vein thrombosis - Plan: CT Abdomen Pelvis W Contrast  Splenic hemorrhage - Plan: CT Abdomen Pelvis W Contrast  Alcohol-induced acute pancreatitis, unspecified complication status Clinically, her pancreatitis seems  to be improving.  She states that she is able to eat soup without any abdominal pain.  Furthermore she denies any symptoms of pancreatic insufficiency.  She denies any diarrhea.  Therefore I will not check a fecal elastase at the present time as she is asymptomatic.  The majority of her pain seems to be located in her left posterior chest where she had a left pleural effusion.  Her breath sounds in that area or abnormal today.  They are diminished.  Therefore [place] repeat a chest x-ray to evaluate for left pleural effusion to determine if the patient requires thoracentesis.  Check CBC CMP to monitor electrolytes and hemoglobin.  Arrange CT of the abdomen and pelvis in 1 week.  If splenic hemorrhage is regressing or stable at that point I plan to resume anticoagulation such as Xarelto 20 mg daily for a total of 3 months to treat the splenic vein thrombosis.

## 2022-06-22 LAB — CBC WITH DIFFERENTIAL/PLATELET
Absolute Monocytes: 655 cells/uL (ref 200–950)
Basophils Absolute: 45 cells/uL (ref 0–200)
Basophils Relative: 0.4 %
Eosinophils Absolute: 102 cells/uL (ref 15–500)
Eosinophils Relative: 0.9 %
HCT: 32.2 % — ABNORMAL LOW (ref 35.0–45.0)
Hemoglobin: 10.7 g/dL — ABNORMAL LOW (ref 11.7–15.5)
Lymphs Abs: 3458 cells/uL (ref 850–3900)
MCH: 32.9 pg (ref 27.0–33.0)
MCHC: 33.2 g/dL (ref 32.0–36.0)
MCV: 99.1 fL (ref 80.0–100.0)
MPV: 9.7 fL (ref 7.5–12.5)
Monocytes Relative: 5.8 %
Neutro Abs: 7040 cells/uL (ref 1500–7800)
Neutrophils Relative %: 62.3 %
Platelets: 748 10*3/uL — ABNORMAL HIGH (ref 140–400)
RBC: 3.25 10*6/uL — ABNORMAL LOW (ref 3.80–5.10)
RDW: 16.1 % — ABNORMAL HIGH (ref 11.0–15.0)
Total Lymphocyte: 30.6 %
WBC: 11.3 10*3/uL — ABNORMAL HIGH (ref 3.8–10.8)

## 2022-06-22 LAB — COMPLETE METABOLIC PANEL WITH GFR
AG Ratio: 0.7 (calc) — ABNORMAL LOW (ref 1.0–2.5)
ALT: 7 U/L (ref 6–29)
AST: 14 U/L (ref 10–30)
Albumin: 3.2 g/dL — ABNORMAL LOW (ref 3.6–5.1)
Alkaline phosphatase (APISO): 89 U/L (ref 31–125)
BUN/Creatinine Ratio: 14 (calc) (ref 6–22)
BUN: 6 mg/dL — ABNORMAL LOW (ref 7–25)
CO2: 35 mmol/L — ABNORMAL HIGH (ref 20–32)
Calcium: 9.2 mg/dL (ref 8.6–10.2)
Chloride: 92 mmol/L — ABNORMAL LOW (ref 98–110)
Creat: 0.44 mg/dL — ABNORMAL LOW (ref 0.50–0.97)
Globulin: 4.6 g/dL (calc) — ABNORMAL HIGH (ref 1.9–3.7)
Glucose, Bld: 80 mg/dL (ref 65–99)
Potassium: 3.3 mmol/L — ABNORMAL LOW (ref 3.5–5.3)
Sodium: 135 mmol/L (ref 135–146)
Total Bilirubin: 0.5 mg/dL (ref 0.2–1.2)
Total Protein: 7.8 g/dL (ref 6.1–8.1)
eGFR: 128 mL/min/{1.73_m2} (ref >=60)

## 2022-06-25 ENCOUNTER — Telehealth: Payer: Self-pay

## 2022-06-25 NOTE — Telephone Encounter (Signed)
Pt called requesting a refill on pain medications due to still having back pain after her acute pancreatitis episode. Pt has CT scheduled for 07/03/2022.

## 2022-06-30 ENCOUNTER — Emergency Department (HOSPITAL_COMMUNITY): Payer: 59

## 2022-06-30 ENCOUNTER — Encounter (HOSPITAL_COMMUNITY): Payer: Self-pay | Admitting: Emergency Medicine

## 2022-06-30 ENCOUNTER — Other Ambulatory Visit: Payer: Self-pay

## 2022-06-30 ENCOUNTER — Inpatient Hospital Stay (HOSPITAL_COMMUNITY)
Admission: EM | Admit: 2022-06-30 | Discharge: 2022-07-13 | DRG: 438 | Disposition: A | Discharge status: Home / Self Care | Payer: 59 | Attending: Family Medicine | Admitting: Family Medicine

## 2022-06-30 DIAGNOSIS — E119 Type 2 diabetes mellitus without complications: Secondary | ICD-10-CM

## 2022-06-30 DIAGNOSIS — Z833 Family history of diabetes mellitus: Secondary | ICD-10-CM | POA: Diagnosis not present

## 2022-06-30 DIAGNOSIS — Z85828 Personal history of other malignant neoplasm of skin: Secondary | ICD-10-CM

## 2022-06-30 DIAGNOSIS — R42 Dizziness and giddiness: Secondary | ICD-10-CM

## 2022-06-30 DIAGNOSIS — Z8249 Family history of ischemic heart disease and other diseases of the circulatory system: Secondary | ICD-10-CM

## 2022-06-30 DIAGNOSIS — J9601 Acute respiratory failure with hypoxia: Secondary | ICD-10-CM | POA: Diagnosis present

## 2022-06-30 DIAGNOSIS — G8929 Other chronic pain: Secondary | ICD-10-CM | POA: Diagnosis present

## 2022-06-30 DIAGNOSIS — E11649 Type 2 diabetes mellitus with hypoglycemia without coma: Secondary | ICD-10-CM | POA: Diagnosis not present

## 2022-06-30 DIAGNOSIS — J189 Pneumonia, unspecified organism: Secondary | ICD-10-CM | POA: Diagnosis present

## 2022-06-30 DIAGNOSIS — Z79890 Hormone replacement therapy (postmenopausal): Secondary | ICD-10-CM

## 2022-06-30 DIAGNOSIS — F17210 Nicotine dependence, cigarettes, uncomplicated: Secondary | ICD-10-CM | POA: Diagnosis present

## 2022-06-30 DIAGNOSIS — E860 Dehydration: Secondary | ICD-10-CM | POA: Diagnosis present

## 2022-06-30 DIAGNOSIS — K8591 Acute pancreatitis with uninfected necrosis, unspecified: Secondary | ICD-10-CM | POA: Diagnosis present

## 2022-06-30 DIAGNOSIS — E8809 Other disorders of plasma-protein metabolism, not elsewhere classified: Secondary | ICD-10-CM | POA: Diagnosis present

## 2022-06-30 DIAGNOSIS — Z560 Unemployment, unspecified: Secondary | ICD-10-CM

## 2022-06-30 DIAGNOSIS — E440 Moderate protein-calorie malnutrition: Secondary | ICD-10-CM | POA: Diagnosis present

## 2022-06-30 DIAGNOSIS — K852 Alcohol induced acute pancreatitis without necrosis or infection: Principal | ICD-10-CM

## 2022-06-30 DIAGNOSIS — Z808 Family history of malignant neoplasm of other organs or systems: Secondary | ICD-10-CM

## 2022-06-30 DIAGNOSIS — J90 Pleural effusion, not elsewhere classified: Secondary | ICD-10-CM | POA: Diagnosis present

## 2022-06-30 DIAGNOSIS — K863 Pseudocyst of pancreas: Secondary | ICD-10-CM | POA: Diagnosis present

## 2022-06-30 DIAGNOSIS — E669 Obesity, unspecified: Secondary | ICD-10-CM | POA: Diagnosis present

## 2022-06-30 DIAGNOSIS — Z8719 Personal history of other diseases of the digestive system: Secondary | ICD-10-CM

## 2022-06-30 DIAGNOSIS — R1013 Epigastric pain: Secondary | ICD-10-CM | POA: Diagnosis not present

## 2022-06-30 DIAGNOSIS — Z86718 Personal history of other venous thrombosis and embolism: Secondary | ICD-10-CM

## 2022-06-30 DIAGNOSIS — D75839 Thrombocytosis, unspecified: Secondary | ICD-10-CM | POA: Diagnosis present

## 2022-06-30 DIAGNOSIS — Z1152 Encounter for screening for COVID-19: Secondary | ICD-10-CM | POA: Diagnosis not present

## 2022-06-30 DIAGNOSIS — Z7984 Long term (current) use of oral hypoglycemic drugs: Secondary | ICD-10-CM

## 2022-06-30 DIAGNOSIS — I10 Essential (primary) hypertension: Secondary | ICD-10-CM | POA: Diagnosis present

## 2022-06-30 DIAGNOSIS — K861 Other chronic pancreatitis: Secondary | ICD-10-CM | POA: Diagnosis present

## 2022-06-30 DIAGNOSIS — K859 Acute pancreatitis without necrosis or infection, unspecified: Secondary | ICD-10-CM | POA: Diagnosis present

## 2022-06-30 DIAGNOSIS — E876 Hypokalemia: Secondary | ICD-10-CM | POA: Diagnosis present

## 2022-06-30 DIAGNOSIS — D649 Anemia, unspecified: Secondary | ICD-10-CM | POA: Diagnosis present

## 2022-06-30 DIAGNOSIS — Z818 Family history of other mental and behavioral disorders: Secondary | ICD-10-CM

## 2022-06-30 DIAGNOSIS — I151 Hypertension secondary to other renal disorders: Secondary | ICD-10-CM | POA: Diagnosis not present

## 2022-06-30 DIAGNOSIS — Z6831 Body mass index (BMI) 31.0-31.9, adult: Secondary | ICD-10-CM

## 2022-06-30 DIAGNOSIS — Z79899 Other long term (current) drug therapy: Secondary | ICD-10-CM

## 2022-06-30 HISTORY — DX: Infarction of spleen: D73.5

## 2022-06-30 HISTORY — DX: Pleural effusion, not elsewhere classified: J90

## 2022-06-30 LAB — COMPREHENSIVE METABOLIC PANEL
ALT: 7 U/L (ref 0–44)
AST: 15 U/L (ref 15–41)
Albumin: 3.2 g/dL — ABNORMAL LOW (ref 3.5–5.0)
Alkaline Phosphatase: 94 U/L (ref 38–126)
Anion gap: 14 (ref 5–15)
BUN: 12 mg/dL (ref 6–20)
CO2: 28 mmol/L (ref 22–32)
Calcium: 9.1 mg/dL (ref 8.9–10.3)
Chloride: 93 mmol/L — ABNORMAL LOW (ref 98–111)
Creatinine, Ser: 0.5 mg/dL (ref 0.44–1.00)
GFR, Estimated: >60 mL/min (ref >60)
Glucose, Bld: 110 mg/dL — ABNORMAL HIGH (ref 70–99)
Potassium: 2.7 mmol/L — CL (ref 3.5–5.1)
Sodium: 135 mmol/L (ref 135–145)
Total Bilirubin: 1.3 mg/dL — ABNORMAL HIGH (ref 0.3–1.2)
Total Protein: 8 g/dL (ref 6.5–8.1)

## 2022-06-30 LAB — URINALYSIS, ROUTINE W REFLEX MICROSCOPIC
Bacteria, UA: NONE SEEN
Glucose, UA: NEGATIVE mg/dL
Hgb urine dipstick: NEGATIVE
Ketones, ur: 20 mg/dL — AB
Leukocytes,Ua: NEGATIVE
Nitrite: NEGATIVE
Protein, ur: 100 mg/dL — AB
Specific Gravity, Urine: 1.027 (ref 1.005–1.030)
pH: 5 (ref 5.0–8.0)

## 2022-06-30 LAB — PROTIME-INR
INR: 1.1 (ref 0.8–1.2)
Prothrombin Time: 14.2 seconds (ref 11.4–15.2)

## 2022-06-30 LAB — CBC
HCT: 39.5 % (ref 36.0–46.0)
Hemoglobin: 13 g/dL (ref 12.0–15.0)
MCH: 31.2 pg (ref 26.0–34.0)
MCHC: 32.9 g/dL (ref 30.0–36.0)
MCV: 94.7 fL (ref 80.0–100.0)
Platelets: 566 10*3/uL — ABNORMAL HIGH (ref 150–400)
RBC: 4.17 MIL/uL (ref 3.87–5.11)
RDW: 17.3 % — ABNORMAL HIGH (ref 11.5–15.5)
WBC: 13.9 10*3/uL — ABNORMAL HIGH (ref 4.0–10.5)
nRBC: 0 % (ref 0.0–0.2)

## 2022-06-30 LAB — POC URINE PREG, ED: Preg Test, Ur: NEGATIVE

## 2022-06-30 LAB — TYPE AND SCREEN
ABO/RH(D): O POS
Antibody Screen: NEGATIVE

## 2022-06-30 LAB — RESP PANEL BY RT-PCR (FLU A&B, COVID) ARPGX2
Influenza A by PCR: NEGATIVE
Influenza B by PCR: NEGATIVE
SARS Coronavirus 2 by RT PCR: NEGATIVE

## 2022-06-30 LAB — APTT: aPTT: 32 seconds (ref 24–36)

## 2022-06-30 LAB — LACTIC ACID, PLASMA
Lactic Acid, Venous: 1 mmol/L (ref 0.5–1.9)
Lactic Acid, Venous: 0.9 mmol/L (ref 0.5–1.9)

## 2022-06-30 LAB — LIPASE, BLOOD: Lipase: 108 U/L — ABNORMAL HIGH (ref 11–51)

## 2022-06-30 MED ORDER — ONDANSETRON HCL 4 MG/2ML IJ SOLN
4.0000 mg | Freq: Once | INTRAMUSCULAR | Status: AC
  Administered 2022-06-30: 4 mg via INTRAVENOUS
  Filled 2022-06-30: qty 2

## 2022-06-30 MED ORDER — POTASSIUM CHLORIDE 10 MEQ/100ML IV SOLN
10.0000 meq | Freq: Once | INTRAVENOUS | Status: AC
  Administered 2022-06-30: 10 meq via INTRAVENOUS
  Filled 2022-06-30: qty 100

## 2022-06-30 MED ORDER — MAGNESIUM SULFATE 2 GM/50ML IV SOLN
2.0000 g | INTRAVENOUS | Status: AC
  Administered 2022-06-30: 2 g via INTRAVENOUS
  Filled 2022-06-30: qty 50

## 2022-06-30 MED ORDER — HYDROMORPHONE HCL 1 MG/ML IJ SOLN
1.0000 mg | Freq: Once | INTRAMUSCULAR | Status: AC
  Administered 2022-06-30: 1 mg via INTRAVENOUS
  Filled 2022-06-30: qty 1

## 2022-06-30 MED ORDER — SODIUM CHLORIDE 0.9 % IV SOLN: 2.0000 g | Freq: Once | INTRAVENOUS | Status: DC

## 2022-06-30 MED ORDER — SODIUM CHLORIDE 0.9 % IV BOLUS
1000.0000 mL | Freq: Once | INTRAVENOUS | Status: AC
  Administered 2022-06-30: 1000 mL via INTRAVENOUS

## 2022-06-30 MED ORDER — LACTATED RINGERS IV SOLN: INTRAVENOUS | Status: DC

## 2022-06-30 MED ORDER — SODIUM CHLORIDE 0.9 % IV SOLN
2.0000 g | Freq: Three times a day (TID) | INTRAVENOUS | Status: DC
  Administered 2022-06-30 – 2022-07-04 (×12): 2 g via INTRAVENOUS
  Filled 2022-06-30 (×12): qty 12.5

## 2022-06-30 MED ORDER — FENTANYL CITRATE (PF) 100 MCG/2ML IJ SOLN
100.0000 ug | INTRAMUSCULAR | Status: DC | PRN
  Administered 2022-06-30 – 2022-07-01 (×6): 100 ug via INTRAVENOUS
  Filled 2022-06-30 (×6): qty 2

## 2022-06-30 MED ORDER — IOHEXOL 300 MG/ML  SOLN
100.0000 mL | Freq: Once | INTRAMUSCULAR | Status: AC | PRN
  Administered 2022-06-30: 100 mL via INTRAVENOUS

## 2022-06-30 MED ORDER — POTASSIUM CHLORIDE IN NACL 40-0.9 MEQ/L-% IV SOLN
INTRAVENOUS | Status: DC
  Filled 2022-06-30 (×2): qty 1000

## 2022-06-30 MED ORDER — SODIUM CHLORIDE 0.9 % IV SOLN: INTRAVENOUS | Status: DC

## 2022-06-30 MED ORDER — METRONIDAZOLE 500 MG/100ML IV SOLN
500.0000 mg | Freq: Once | INTRAVENOUS | Status: AC
  Administered 2022-06-30: 500 mg via INTRAVENOUS
  Filled 2022-06-30: qty 100

## 2022-06-30 NOTE — ED Notes (Signed)
Pt says Dilaudid works better for her than Fentanyl- Dr Sabra Heck made aware- no new orders received- instructed to utilize PRN fentanyl order for pain- pt had this at 1844, cannot give again until 2044- pt made aware of this.

## 2022-06-30 NOTE — Progress Notes (Signed)
Pharmacy Antibiotic Note  Ashby Leflore is a 36 y.o. female admitted on 06/30/2022 with  intraabdominal infection .  Pharmacy has been consulted for cefepime dosing.  Plan: Cefepime 2g Q8H to start now  Height: '5\' 3"'$  (160 cm) Weight: 65.8 kg (145 lb) IBW/kg (Calculated) : 52.4  Temp (24hrs), Avg:98.2 F (36.8 C), Min:98.2 F (36.8 C), Max:98.2 F (36.8 C)  Recent Labs  Lab 06/30/22 1754  WBC 13.9*    Estimated Creatinine Clearance: 87.9 mL/min (A) (by C-G formula based on SCr of 0.44 mg/dL (L)).    No Known Allergies  Antimicrobials this admission: Metronidazole 500 mg x1 (11/25)  Microbiology results: None yet obtained  Thank you for allowing pharmacy to be a part of this patient's care.  Yolanda Bonine, PharmD, MHSA, BCPPS 06/30/2022 6:16 PM

## 2022-06-30 NOTE — ED Notes (Signed)
Pt back from CT

## 2022-06-30 NOTE — ED Notes (Signed)
Pt given water per Dr Sabra Heck ok

## 2022-06-30 NOTE — ED Provider Notes (Signed)
[facility] EMERGENCY DEPARTMENT Provider Note   CSN: [id number] Arrival date & time: 06/30/22  1620     History  Chief Complaint  Patient presents with   Abdominal Pain    Jillian Shepherd is a 36 y.o. female.   Abdominal Pain    This patient is a 36 year old female, she has been diagnosed with hypertension on amlodipine, she is on spironolactone and she is on metformin.  She was recently admitted to the hospital with acute pancreatitis thought to be related to alcohol induced acute pancreatitis.  Interestingly the patient had been admitted to the hospital at the beginning of October of this year approximately 7 weeks ago after having abdominal pain, the CT scan showed possible acute pancreatitis with no definite pseudocyst formation and she was hypokalemic.  She was admitted again on October 26 and kept in the hospital for approximately 2 weeks with what appeared to be multiple abnormalities including acute pancreatitis, splenic vein thrombosis, spleen hematoma which had ruptured, hypoxic respiratory failure a pleural effusion on the left requiring thoracentesis healthcare associated pneumonia and the development of a pancreatic pseudocyst.  The patient was discharged home and for the last 2 weeks she has been at home with progressively worsening abdominal pain, decreased appetite, she is taking very little fluids and making very little urine which she states is dark and smells bad.  She has not been vomiting but has been nauseated and the pain is getting worse all the time.  She now has pain when she moves when she breathes when she coughs and the pain is radiating to her back.  She denies ever having to have a blood transfusion.  She reports she has not had any alcohol to drink in over a month since being admitted to the hospital last time.  Home Medications Prior to Admission medications   Medication Sig Start Date End Date Taking? Authorizing Provider  amLODipine (NORVASC) 10 MG  tablet Take 1 tablet (10 mg total) by mouth daily. 04/10/22   Rubie Maid, FNP  cyanocobalamin (VITAMIN B12) 1000 MCG/ML injection Inject 1 mL (1,000 mcg total) into the muscle every 30 (thirty) days. 05/17/22   [name], MD  folic acid (FOLVITE) 1 MG tablet Take 1 tablet (1 mg total) by mouth daily. 05/17/22 05/12/23  [name], MD  levofloxacin (LEVAQUIN) 500 MG tablet Take 1 tablet (500 mg total) by mouth daily. 06/14/22   Patrecia Pour, MD  metFORMIN (GLUCOPHAGE) 500 MG tablet Take 1 tablet (500 mg total) by mouth 2 (two) times daily with a meal. 05/18/22   [name], MD  potassium chloride SA (KLOR-CON M20) 20 MEQ tablet Take 3 tablets (60 mEq total) by mouth daily. Patient taking differently: Take 20 mEq by mouth daily. 05/21/22   Rubie Maid, FNP  spironolactone (ALDACTONE) 25 MG tablet Take 1 tablet (25 mg total) by mouth daily. Patient taking differently: Take 25 mg by mouth 2 (two) times daily. 05/13/22   Orson Eva, MD      Allergies    Patient has no known allergies.    Review of Systems   Review of Systems  Gastrointestinal:  Positive for abdominal pain.  All other systems reviewed and are negative.   Physical Exam Updated Vital Signs BP 126/89   Pulse 97   Temp 98.2 F (36.8 C) (Oral)   Resp 19   Ht 1.6 m ('5\' 3"'$ )   Wt 65.8 kg   LMP 05/29/2022   SpO2  95%   BMI 25.69 kg/m  Physical Exam Vitals and nursing note reviewed.  Constitutional:      General: She is in acute distress.     Appearance: She is well-developed. She is ill-appearing.  HENT:     Head: Normocephalic and atraumatic.     Mouth/Throat:     Pharynx: No oropharyngeal exudate.  Eyes:     General: No scleral icterus.       Right eye: No discharge.        Left eye: No discharge.     Conjunctiva/sclera: Conjunctivae normal.     Pupils: Pupils are equal, round, and reactive to light.  Neck:     Thyroid: No thyromegaly.     Vascular: No JVD.  Cardiovascular:     Rate  and Rhythm: Regular rhythm. Tachycardia present.     Heart sounds: Normal heart sounds. No murmur heard.    No friction rub. No gallop.  Pulmonary:     Effort: Respiratory distress present.     Breath sounds: No wheezing or rales.     Comments: Mild tachypnea, decreased lung sounds at the left mid and lower lungs.  She is tachypneic Abdominal:     General: Bowel sounds are normal. There is no distension.     Palpations: Abdomen is soft. There is no mass.     Tenderness: There is abdominal tenderness. There is guarding and rebound.  Musculoskeletal:        General: No tenderness. Normal range of motion.     Cervical back: Normal range of motion and neck supple.     Right lower leg: No edema.     Left lower leg: No edema.  Lymphadenopathy:     Cervical: No cervical adenopathy.  Skin:    General: Skin is warm and dry.     Findings: No erythema or rash.  Neurological:     General: No focal deficit present.     Mental Status: She is alert.     Coordination: Coordination normal.  Psychiatric:        Behavior: Behavior normal.     ED Results / Procedures / Treatments   Labs (all labs ordered are listed, but only abnormal results are displayed) Labs Reviewed  LIPASE, BLOOD - Abnormal; Notable for the following components:      Result Value   Lipase 108 (*)    All other components within normal limits  COMPREHENSIVE METABOLIC PANEL - Abnormal; Notable for the following components:   Potassium 2.7 (*)    Chloride 93 (*)    Glucose, Bld 110 (*)    Albumin 3.2 (*)    Total Bilirubin 1.3 (*)    All other components within normal limits  CBC - Abnormal; Notable for the following components:   WBC 13.9 (*)    RDW 17.3 (*)    Platelets 566 (*)    All other components within normal limits  URINALYSIS, ROUTINE W REFLEX MICROSCOPIC - Abnormal; Notable for the following components:   Color, Urine AMBER (*)    APPearance HAZY (*)    Bilirubin Urine SMALL (*)    Ketones, ur 20 (*)     Protein, ur 100 (*)    All other components within normal limits  CULTURE, BLOOD (ROUTINE X 2)  CULTURE, BLOOD (ROUTINE X 2)  RESP PANEL BY RT-PCR (FLU A&B, COVID) ARPGX2  URINE CULTURE  LACTIC ACID, PLASMA  LACTIC ACID, PLASMA  PROTIME-INR  APTT  POC URINE PREG, ED  TYPE AND SCREEN    EKG EKG Interpretation  Date/Time:  Saturday June 30 2022 17:30:58 EST Ventricular Rate:  129 PR Interval:  120 QRS Duration: 88 QT Interval:  324 QTC Calculation: 474 R Axis:   33 Text Interpretation: Sinus tachycardia Cannot rule out Anterior infarct (cited on or before 09-May-2022) Abnormal ECG When compared with ECG of 10-May-2022 06:19, No significant change was found Confirmed by Noemi Chapel [id number]) on 06/30/2022 5:44:03 PM  Radiology CT ABDOMEN PELVIS W CONTRAST  Result Date: 06/30/2022 CLINICAL DATA:  Abdominal pain, acute, nonlocalized diffuse pain, recent pancreatitis EXAM: CT ABDOMEN AND PELVIS WITH CONTRAST TECHNIQUE: Multidetector CT imaging of the abdomen and pelvis was performed using the standard protocol following bolus administration of intravenous contrast. RADIATION DOSE REDUCTION: This exam was performed according to the departmental dose-optimization program which includes automated exposure control, adjustment of the mA and/or kV according to patient size and/or use of iterative reconstruction technique. CONTRAST:  122m OMNIPAQUE IOHEXOL 300 MG/ML  SOLN COMPARISON:  None Available. FINDINGS: Lower chest: Trace bilateral, left greater than right, pleural effusion. Associated passive atelectasis of left lower lobe. Hepatobiliary: The liver is enlarged measuring up to 19 cm. No focal liver abnormality. No gallstones or definite gallbladder wall thickening. Positive for pericholecystic fluid. No biliary dilatation. Pancreas: No focal lesion. Hazy pancreatic contour with associated peripancreatic fat stranding and free fluid. Slightly decreased in size cystic lesion measuring 2 x  2 cm along the distal pancreatic body. Poorly visualized distal pancreatic tail likely due to cystic changes and edema with underlying nonenhancement of the pancreatic tail not fully excluded. Interval resolution of cystic lesion along the greater curvature of the stomach. No main pancreatic ductal dilatation. Spleen: Similar-appearing heterogeneous 14 x 8 cm perisplenic subcapsular lesion likely representing a hematoma. Adrenals/Urinary Tract: No focal lesion. Normal pancreatic contour. No surrounding inflammatory changes. No main pancreatic ductal dilatation. Stomach/Bowel: Stomach is within normal limits. No evidence of bowel wall thickening or dilatation. Appendix appears normal. Vascular/Lymphatic: No splenic artery aneurysm identified. Venous collaterals noted. No abdominal aorta or iliac aneurysm. Mild atherosclerotic plaque of the aorta and its branches. No abdominal, pelvic, or inguinal lymphadenopathy. Reproductive: Uterus and bilateral adnexa are unremarkable. Other: Small volume simple free fluid ascites. No intraperitoneal free gas. No organized fluid collection. Musculoskeletal: No abdominal wall hernia or abnormality. Soft tissue density along the anterior abdominal wall likely due to medication injection. No suspicious lytic or blastic osseous lesions. No acute displaced fracture. Multilevel degenerative changes of the spine. IMPRESSION: 1. Acute pancreatitis with interval decrease in size of a distal pancreatic body cystic lesion likely representing a pseudocyst. Interval resolution of cystic lesion along the greater curvature of the stomach. Component of necrotizing pancreatitis along the pancreatic tail is not fully excluded. 2. Similar-appearing heterogeneous 14 x 8 cm perisplenic subcapsular hematoma. 3. Nonspecific pericholecystic fluid with no definite CT findings acute cholecystitis. 4. Small volume simple free fluid ascites. 5. Trace bilateral, left greater than right, pleural effusion. 6.  Mild hepatomegaly. 7. Venous collaterals. Electronically Signed   By: MIven FinnM.D.   On: 06/30/2022 22:45   DG Chest Port 1 View  Result Date: 06/30/2022 CLINICAL DATA:  Recent effusion with decreased lung sounds on the left EXAM: PORTABLE CHEST 1 VIEW COMPARISON:  Radiographs 06/13/2022 FINDINGS: Slight decrease in small left pleural effusion and associated atelectasis since 06/13/2022. Possible trace right pleural effusion. No pneumothorax. Stable cardiomediastinal silhouette. No acute osseous abnormality. IMPRESSION: Since 06/13/2022, decreased small left pleural effusion and associated  atelectasis. Pneumonia is difficult to exclude. Electronically Signed   By: Placido Sou M.D.   On: 06/30/2022 18:26    Procedures Procedures    Medications Ordered in ED Medications  fentaNYL (SUBLIMAZE) injection 100 mcg (100 mcg Intravenous Given 06/30/22 2157)  lactated ringers infusion ( Intravenous New Bag/Given 06/30/22 2014)  ceFEPIme (MAXIPIME) 2 g in sodium chloride 0.9 % 100 mL IVPB (0 g Intravenous Stopped 06/30/22 1920)  ondansetron (ZOFRAN) injection 4 mg (has no administration in time range)  0.9 %  sodium chloride infusion (has no administration in time range)  sodium chloride 0.9 % bolus 1,000 mL (1,000 mLs Intravenous Bolus 06/30/22 1836)  metroNIDAZOLE (FLAGYL) IVPB 500 mg (0 mg Intravenous Stopped 06/30/22 1956)  magnesium sulfate IVPB 2 g 50 mL (0 g Intravenous Stopped 06/30/22 2112)  potassium chloride 10 mEq in 100 mL IVPB (0 mEq Intravenous Stopped 06/30/22 2246)  HYDROmorphone (DILAUDID) injection 1 mg (1 mg Intravenous Given 06/30/22 2015)  iohexol (OMNIPAQUE) 300 MG/ML solution 100 mL (100 mLs Intravenous Contrast Given 06/30/22 2214)    ED Course/ Medical Decision Making/ A&P                           Medical Decision Making Amount and/or Complexity of Data Reviewed Labs: ordered. Radiology: ordered.  Risk Prescription drug management. Decision regarding  hospitalization.   This patient presents to the ED for concern of abdominal pain, this involves an extensive number of treatment options, and is a complaint that carries with it a high risk of complications and morbidity.  The differential diagnosis includes worsening pancreatitis, worsening hemorrhage or splenic hematoma, the patient may have necrotic pancreatitis, infectious origin could be possible from the biliary tree as well, she is tachycardic and ill-appearing, sepsis order set will be used   Co morbidities that complicate the patient evaluation  Alcohol abuse history Is an admission to the hospital for pancreatitis complicated by splenic hematoma   Additional history obtained:  Additional history obtained from electronic medical record External records from outside source obtained and reviewed including history and physicals discharge summaries and procedure notes   Lab Tests:  I Ordered, and personally interpreted labs.  The pertinent results include: Sepsis order sent, CBC shows leukocytosis, with a white blood cell count of approximately 13,900, recent CBC performed 10 days ago showed leukocytosis of 11,000.  There is no anemia, the metabolic panel reveals hypokalemia with a potassium of 2.7 compared to previous values that were 3.3.  Creatinine is preserved at 0.5, lipase was 108 which had increased since the last measured value when it was at 25.  Her urinalysis reveals ketonuria but no signs of infection and the lactic acid was 1.0.   Imaging Studies ordered:  I ordered imaging studies including chest x-ray and CT scan of the abdomen and pelvis I independently visualized and interpreted imaging which showed CT scan of the abdomen and pelvis reveals that the patient has ongoing findings of a process consistent with acute pancreatitis, it does appear that there is still a pseudocyst, a similar-appearing perisplenic subcapsular hematoma which is essentially unchanged and  nonspecific pericholecystic fluid but no evidence of cholecystitis.  The chest x-ray does reveal a decreased left-sided pleural effusion which is also seen on CT scan I agree with the radiologist interpretation   Cardiac Monitoring: / EKG:  The patient was maintained on a cardiac monitor.  I personally viewed and interpreted the cardiac monitored which showed an underlying  rhythm of: Sinus tachycardia   Consultations Obtained:  I requested consultation with the hospitalist,  and discussed lab and imaging findings as well as pertinent plan - they recommend: Admission to the hospital   Problem List / ED Course / Critical interventions / Medication management  This patient presents with multiple different pathologies but primarily likely stemming from the pancreatitis and the poor oral intake.  She is dehydrated and needs IV fluids, she is hypokalemic and needs replacement, she is in significant pain and needs pain medication control antiemetics and IV fluids. I ordered medication including Dilaudid and Zofran as well as IV fluids for pain nausea and dehydration Reevaluation of the patient after these medicines showed that the patient improved I have reviewed the patients home medicines and have made adjustments as needed   Social Determinants of Health:  Chronic alcoholic pancreatitis   Test / Admission - Considered:  We will need admission to the hospital         Final Clinical Impression(s) / ED Diagnoses Final diagnoses:  Alcohol-induced acute pancreatitis, unspecified complication status  Hypokalemia  Dehydration    Rx / DC Orders ED Discharge Orders     None         Noemi Chapel, MD 06/30/22 2252

## 2022-06-30 NOTE — Sepsis Progress Note (Signed)
Sepsis protocol is being followed by [facility]. 

## 2022-06-30 NOTE — ED Notes (Signed)
Pt still unable to give urine sample- new orders received for in/out cath by Dr Sabra Heck

## 2022-06-30 NOTE — ED Triage Notes (Addendum)
Patient c/o generalized abd pain that radiates into back with shortness of breath. Per patient nausea, vomiting, and diarrhea. Patient denies any fevers. Per patient dark urine with strong odor. Patient here in ER on 10/26, admitted x1.5 weeks and then sent to [facility] via [facility] for  weeks. Patient had pancreatitis, pleural effusion, splenic hemorrhage, and chest tube placement. Per patient has been on liquid diet since discharge.

## 2022-07-01 DIAGNOSIS — I10 Essential (primary) hypertension: Secondary | ICD-10-CM

## 2022-07-01 DIAGNOSIS — E440 Moderate protein-calorie malnutrition: Secondary | ICD-10-CM

## 2022-07-01 DIAGNOSIS — J9601 Acute respiratory failure with hypoxia: Secondary | ICD-10-CM

## 2022-07-01 DIAGNOSIS — E119 Type 2 diabetes mellitus without complications: Secondary | ICD-10-CM

## 2022-07-01 DIAGNOSIS — K859 Acute pancreatitis without necrosis or infection, unspecified: Secondary | ICD-10-CM | POA: Diagnosis not present

## 2022-07-01 DIAGNOSIS — K861 Other chronic pancreatitis: Secondary | ICD-10-CM | POA: Diagnosis not present

## 2022-07-01 DIAGNOSIS — E876 Hypokalemia: Secondary | ICD-10-CM

## 2022-07-01 LAB — COMPREHENSIVE METABOLIC PANEL
ALT: 6 U/L (ref 0–44)
AST: 12 U/L — ABNORMAL LOW (ref 15–41)
Albumin: 2.4 g/dL — ABNORMAL LOW (ref 3.5–5.0)
Alkaline Phosphatase: 72 U/L (ref 38–126)
Anion gap: 9 (ref 5–15)
BUN: 11 mg/dL (ref 6–20)
CO2: 29 mmol/L (ref 22–32)
Calcium: 8.1 mg/dL — ABNORMAL LOW (ref 8.9–10.3)
Chloride: 98 mmol/L (ref 98–111)
Creatinine, Ser: 0.41 mg/dL — ABNORMAL LOW (ref 0.44–1.00)
GFR, Estimated: >60 mL/min (ref >60)
Glucose, Bld: 69 mg/dL — ABNORMAL LOW (ref 70–99)
Potassium: 2.9 mmol/L — ABNORMAL LOW (ref 3.5–5.1)
Sodium: 136 mmol/L (ref 135–145)
Total Bilirubin: 1.2 mg/dL (ref 0.3–1.2)
Total Protein: 6.3 g/dL — ABNORMAL LOW (ref 6.5–8.1)

## 2022-07-01 LAB — CBC WITH DIFFERENTIAL/PLATELET
Abs Immature Granulocytes: 0.03 10*3/uL (ref 0.00–0.07)
Basophils Absolute: 0 10*3/uL (ref 0.0–0.1)
Basophils Relative: 0 %
Eosinophils Absolute: 0.2 10*3/uL (ref 0.0–0.5)
Eosinophils Relative: 3 %
HCT: 34.1 % — ABNORMAL LOW (ref 36.0–46.0)
Hemoglobin: 10.7 g/dL — ABNORMAL LOW (ref 12.0–15.0)
Immature Granulocytes: 0 %
Lymphocytes Relative: 21 %
Lymphs Abs: 2 10*3/uL (ref 0.7–4.0)
MCH: 30.7 pg (ref 26.0–34.0)
MCHC: 31.4 g/dL (ref 30.0–36.0)
MCV: 98 fL (ref 80.0–100.0)
Monocytes Absolute: 0.7 10*3/uL (ref 0.1–1.0)
Monocytes Relative: 7 %
Neutro Abs: 6.6 10*3/uL (ref 1.7–7.7)
Neutrophils Relative %: 69 %
Platelets: 415 10*3/uL — ABNORMAL HIGH (ref 150–400)
RBC: 3.48 MIL/uL — ABNORMAL LOW (ref 3.87–5.11)
RDW: 17.4 % — ABNORMAL HIGH (ref 11.5–15.5)
WBC: 9.5 10*3/uL (ref 4.0–10.5)
nRBC: 0 % (ref 0.0–0.2)

## 2022-07-01 LAB — GLUCOSE, CAPILLARY
Glucose-Capillary: 77 mg/dL (ref 70–99)
Glucose-Capillary: 77 mg/dL (ref 70–99)

## 2022-07-01 LAB — MAGNESIUM: Magnesium: 2.1 mg/dL (ref 1.7–2.4)

## 2022-07-01 LAB — MRSA NEXT GEN BY PCR, NASAL: MRSA by PCR Next Gen: NOT DETECTED

## 2022-07-01 LAB — LIPASE, BLOOD: Lipase: 130 U/L — ABNORMAL HIGH (ref 11–51)

## 2022-07-01 MED ORDER — LACTATED RINGERS IV SOLN: INTRAVENOUS | Status: DC

## 2022-07-01 MED ORDER — SPIRONOLACTONE 25 MG PO TABS
25.0000 mg | ORAL_TABLET | Freq: Two times a day (BID) | ORAL | Status: DC
  Administered 2022-07-01 – 2022-07-04 (×7): 25 mg via ORAL
  Filled 2022-07-01 (×7): qty 1

## 2022-07-01 MED ORDER — AMLODIPINE BESYLATE 5 MG PO TABS
10.0000 mg | ORAL_TABLET | Freq: Every day | ORAL | Status: DC
  Administered 2022-07-01 – 2022-07-08 (×8): 10 mg via ORAL
  Filled 2022-07-01 (×8): qty 2

## 2022-07-01 MED ORDER — ONDANSETRON HCL 4 MG PO TABS: 4.0000 mg | ORAL_TABLET | Freq: Four times a day (QID) | ORAL | Status: DC | PRN

## 2022-07-01 MED ORDER — INSULIN ASPART 100 UNIT/ML IJ SOLN: 0.0000 [IU] | INTRAMUSCULAR | Status: DC

## 2022-07-01 MED ORDER — IBUPROFEN 400 MG PO TABS
400.0000 mg | ORAL_TABLET | Freq: Four times a day (QID) | ORAL | Status: DC | PRN
  Administered 2022-07-01 – 2022-07-02 (×3): 400 mg via ORAL
  Filled 2022-07-01 (×3): qty 1

## 2022-07-01 MED ORDER — CHLORHEXIDINE GLUCONATE CLOTH 2 % EX PADS
6.0000 | MEDICATED_PAD | Freq: Every day | CUTANEOUS | Status: DC
  Administered 2022-07-01 – 2022-07-12 (×12): 6 via TOPICAL

## 2022-07-01 MED ORDER — METRONIDAZOLE 500 MG/100ML IV SOLN
500.0000 mg | Freq: Two times a day (BID) | INTRAVENOUS | Status: DC
  Administered 2022-07-01 – 2022-07-04 (×7): 500 mg via INTRAVENOUS
  Filled 2022-07-01 (×7): qty 100

## 2022-07-01 MED ORDER — FENTANYL CITRATE PF 50 MCG/ML IJ SOSY
50.0000 ug | PREFILLED_SYRINGE | INTRAMUSCULAR | Status: DC | PRN
  Administered 2022-07-01 – 2022-07-02 (×7): 75 ug via INTRAVENOUS
  Filled 2022-07-01 (×7): qty 2

## 2022-07-01 MED ORDER — ONDANSETRON HCL 4 MG/2ML IJ SOLN
4.0000 mg | Freq: Four times a day (QID) | INTRAMUSCULAR | Status: DC | PRN
  Administered 2022-07-02: 4 mg via INTRAVENOUS
  Filled 2022-07-01: qty 2

## 2022-07-01 MED ORDER — MECLIZINE HCL 12.5 MG PO TABS
25.0000 mg | ORAL_TABLET | Freq: Three times a day (TID) | ORAL | Status: DC | PRN
  Administered 2022-07-02: 25 mg via ORAL
  Filled 2022-07-01: qty 2

## 2022-07-01 MED ORDER — OXYCODONE HCL 5 MG PO TABS
5.0000 mg | ORAL_TABLET | ORAL | Status: DC | PRN
  Administered 2022-07-01: 5 mg via ORAL
  Filled 2022-07-01: qty 1

## 2022-07-01 MED ORDER — FOLIC ACID 1 MG PO TABS
1.0000 mg | ORAL_TABLET | Freq: Every day | ORAL | Status: DC
  Administered 2022-07-01 – 2022-07-08 (×8): 1 mg via ORAL
  Filled 2022-07-01 (×8): qty 1

## 2022-07-01 MED ORDER — MORPHINE SULFATE (PF) 4 MG/ML IV SOLN: 4.0000 mg | INTRAVENOUS | Status: DC | PRN

## 2022-07-01 MED ORDER — PANTOPRAZOLE SODIUM 40 MG IV SOLR
40.0000 mg | INTRAVENOUS | Status: DC
  Administered 2022-07-01 – 2022-07-12 (×12): 40 mg via INTRAVENOUS
  Filled 2022-07-01 (×12): qty 10

## 2022-07-01 NOTE — Assessment & Plan Note (Addendum)
-   Pt presented with abdominal pain with leukocytosis, thrombocytosis, CT scan showing acute pancreatitis with interval decrease in size of the distal pancreatic body cystic lesion that was likely a pseudocyst.  Component of necrotizing pancreatitis cannot be excluded.  Perisplenic subcapsular hematoma still present-hold anticoagulation - Patient finished Levaquin on November 12 - Broad-spectrum antibiotic coverage with cefepime and Flagyl this visit - Last lipid panel shows triglycerides of 115 - No new medications that could have contributed to this acute flare - Previously thought to be alcoholic pancreatitis, but patient has not had a drink in 1 month per her report - Pt reports pain not controlled, changed to dilaudid 11/27 with better control.  Continue clear liquid diet. Appreciate GI team recommendations.

## 2022-07-01 NOTE — Progress Notes (Signed)
ASSUMPTION OF CARE NOTE   07/01/2022 11:42 AM  [name] was seen and examined.  The H&P by the admitting provider, orders, imaging was reviewed.  Please see new orders.  Will continue to follow.  Pt noted to be on very high doses of IV opioids (fentanyl, morphine, oxycodone).  We have reduced the amount of IV opioid at this time.  I am concerned about necrotizing pancreatitis and requested GI consultation.  Continue supportive measures, continue to follow in stepdown ICU.    Vitals:   07/01/22 0800 07/01/22 0900  BP:    Pulse: 71 69  Resp: 13 14  Temp:    SpO2: 97% 98%    Results for orders placed or performed during the hospital encounter of 06/30/22  Blood culture (routine x 2)   Specimen: BLOOD LEFT FOREARM  Result Value Ref Range   Specimen Description      BLOOD LEFT FOREARM BOTTLES DRAWN AEROBIC AND ANAEROBIC   Special Requests Blood Culture adequate volume    Culture      NO GROWTH < 12 HOURS Performed at [facility], [address] [place], [place] [address]    Report Status PENDING   Blood culture (routine x 2)   Specimen: Right Antecubital; Blood  Result Value Ref Range   Specimen Description      RIGHT ANTECUBITAL BOTTLES DRAWN AEROBIC AND ANAEROBIC   Special Requests Blood Culture adequate volume    Culture      NO GROWTH < 12 HOURS Performed at [facility], [address] [place], [place] [address]    Report Status PENDING   Resp Panel by RT-PCR (Flu A&B, Covid) Anterior Nasal Swab   Specimen: Anterior Nasal Swab  Result Value Ref Range   SARS Coronavirus 2 by RT PCR NEGATIVE NEGATIVE   Influenza A by PCR NEGATIVE NEGATIVE   Influenza B by PCR NEGATIVE NEGATIVE  MRSA Next Gen by PCR, Nasal   Specimen: Nasal Mucosa; Nasal Swab  Result Value Ref Range   MRSA by PCR Next Gen NOT DETECTED NOT DETECTED  Lipase, blood  Result Value Ref Range   Lipase 108 (H) 11 - 51 U/L  Comprehensive metabolic panel  Result Value Ref Range   Sodium 135 135 - 145  mmol/L   Potassium 2.7 (LL) 3.5 - 5.1 mmol/L   Chloride 93 (L) 98 - 111 mmol/L   CO2 28 22 - 32 mmol/L   Glucose, Bld 110 (H) 70 - 99 mg/dL   BUN 12 6 - 20 mg/dL   Creatinine, Ser 0.50 0.44 - 1.00 mg/dL   Calcium 9.1 8.9 - 10.3 mg/dL   Total Protein 8.0 6.5 - 8.1 g/dL   Albumin 3.2 (L) 3.5 - 5.0 g/dL   AST 15 15 - 41 U/L   ALT 7 0 - 44 U/L   Alkaline Phosphatase 94 38 - 126 U/L   Total Bilirubin 1.3 (H) 0.3 - 1.2 mg/dL   GFR, Estimated >60 >60 mL/min   Anion gap 14 5 - 15  CBC  Result Value Ref Range   WBC 13.9 (H) 4.0 - 10.5 K/uL   RBC 4.17 3.87 - 5.11 MIL/uL   Hemoglobin 13.0 12.0 - 15.0 g/dL   HCT 39.5 36.0 - 46.0 %   MCV 94.7 80.0 - 100.0 fL   MCH 31.2 26.0 - 34.0 pg   MCHC 32.9 30.0 - 36.0 g/dL   RDW 17.3 (H) 11.5 - 15.5 %   Platelets 566 (H) 150 - 400  K/uL   nRBC 0.0 0.0 - 0.2 %  Urinalysis, Routine w reflex microscopic In/Out Cath Urine  Result Value Ref Range   Color, Urine AMBER (A) YELLOW   APPearance HAZY (A) CLEAR   Specific Gravity, Urine 1.027 1.005 - 1.030   pH 5.0 5.0 - 8.0   Glucose, UA NEGATIVE NEGATIVE mg/dL   Hgb urine dipstick NEGATIVE NEGATIVE   Bilirubin Urine SMALL (A) NEGATIVE   Ketones, ur 20 (A) NEGATIVE mg/dL   Protein, ur 100 (A) NEGATIVE mg/dL   Nitrite NEGATIVE NEGATIVE   Leukocytes,Ua NEGATIVE NEGATIVE   RBC / HPF 0-5 0 - 5 RBC/hpf   WBC, UA 6-10 0 - 5 WBC/hpf   Bacteria, UA NONE SEEN NONE SEEN   Squamous Epithelial / LPF 11-20 0 - 5   Mucus PRESENT    Hyaline Casts, UA PRESENT   Lactic acid, plasma  Result Value Ref Range   Lactic Acid, Venous 1.0 0.5 - 1.9 mmol/L  Lactic acid, plasma  Result Value Ref Range   Lactic Acid, Venous 0.9 0.5 - 1.9 mmol/L  Protime-INR  Result Value Ref Range   Prothrombin Time 14.2 11.4 - 15.2 seconds   INR 1.1 0.8 - 1.2  APTT  Result Value Ref Range   aPTT 32 24 - 36 seconds  Comprehensive metabolic panel  Result Value Ref Range   Sodium 136 135 - 145 mmol/L   Potassium 2.9 (L) 3.5 - 5.1  mmol/L   Chloride 98 98 - 111 mmol/L   CO2 29 22 - 32 mmol/L   Glucose, Bld 69 (L) 70 - 99 mg/dL   BUN 11 6 - 20 mg/dL   Creatinine, Ser 0.41 (L) 0.44 - 1.00 mg/dL   Calcium 8.1 (L) 8.9 - 10.3 mg/dL   Total Protein 6.3 (L) 6.5 - 8.1 g/dL   Albumin 2.4 (L) 3.5 - 5.0 g/dL   AST 12 (L) 15 - 41 U/L   ALT 6 0 - 44 U/L   Alkaline Phosphatase 72 38 - 126 U/L   Total Bilirubin 1.2 0.3 - 1.2 mg/dL   GFR, Estimated >60 >60 mL/min   Anion gap 9 5 - 15  Magnesium  Result Value Ref Range   Magnesium 2.1 1.7 - 2.4 mg/dL  CBC with Differential/Platelet  Result Value Ref Range   WBC 9.5 4.0 - 10.5 K/uL   RBC 3.48 (L) 3.87 - 5.11 MIL/uL   Hemoglobin 10.7 (L) 12.0 - 15.0 g/dL   HCT 34.1 (L) 36.0 - 46.0 %   MCV 98.0 80.0 - 100.0 fL   MCH 30.7 26.0 - 34.0 pg   MCHC 31.4 30.0 - 36.0 g/dL   RDW 17.4 (H) 11.5 - 15.5 %   Platelets 415 (H) 150 - 400 K/uL   nRBC 0.0 0.0 - 0.2 %   Neutrophils Relative % 69 %   Neutro Abs 6.6 1.7 - 7.7 K/uL   Lymphocytes Relative 21 %   Lymphs Abs 2.0 0.7 - 4.0 K/uL   Monocytes Relative 7 %   Monocytes Absolute 0.7 0.1 - 1.0 K/uL   Eosinophils Relative 3 %   Eosinophils Absolute 0.2 0.0 - 0.5 K/uL   Basophils Relative 0 %   Basophils Absolute 0.0 0.0 - 0.1 K/uL   Immature Granulocytes 0 %   Abs Immature Granulocytes 0.03 0.00 - 0.07 K/uL  Lipase, blood  Result Value Ref Range   Lipase 130 (H) 11 - 51 U/L  Glucose, capillary  Result Value Ref Range  Glucose-Capillary 77 70 - 99 mg/dL  Glucose, capillary  Result Value Ref Range   Glucose-Capillary 77 70 - 99 mg/dL  POC urine preg, ED  Result Value Ref Range   Preg Test, Ur NEGATIVE NEGATIVE  Type and screen  Result Value Ref Range   ABO/RH(D) O POS    Antibody Screen NEG    Sample Expiration      07/03/2022,2359 Performed at [facility], [address] [place], [place] [address]    C. Wynetta Emery, MD [facility] 07/01/2022 11:44 AM  How to contact the [facility] Attending or Consulting provider [place] or covering provider during after hours [place], for this patient?  Check the care team in [facility] and look for a) attending/consulting [facility] provider listed and b) the [facility] team listed Log into [email or web address] and use [place]'s universal password to access. If you do not have the password, please contact the hospital operator. Locate the [facility] provider you are looking for under [facility] and page to a number that you can be directly reached. If you still have difficulty reaching the provider, please page the [facility] (Director on Call) for the Hospitalists listed on amion for assistance.

## 2022-07-01 NOTE — H&P (Signed)
History and Physical    Patient: Jillian Shepherd ZDG:[id number] DOB: August 07, 1985 DOA: 06/30/2022 DOS: the patient was seen and examined on 07/01/2022 PCP: [name], MD  Patient coming from: Home  Chief Complaint:  Chief Complaint  Patient presents with   Abdominal Pain   HPI: Jillian Shepherd is a 36 y.o. female with medical history significant of alcohol abuse, B12 deficiency, hypokalemia, Liddle syndrome, history of splenic hemorrhage with splenic subcapsular hematoma, presents to the ED with a chief complaint of abdominal pain.  Patient was discharged about 2 weeks ago.  She reports that her pain was never improved since discharge.  It was much worse today.  She describes it as a throbbing pain in her epigastric region radiating around to her back, and down midline abdomen.  It is sharp.  Sometimes it feels like squeezing.  At its worst is a 10 out of 10, after 100 mcg of fentanyl and says 7-8 out of 10.  Patient reports she had nausea with nonbloody emesis x4.  Her last normal liquid meal was at noon.  Her last normal bowel movement was this morning.  She reports that the color is bright yellow, but otherwise it was normal bowel movement for her.  It was nonbloody.  Patient reports she has been on a strict full liquid, but as she describes it it sounds more like a soft diet with applesauce, yogurt, Ensure shakes.  Patient was discharged on Levaquin previously she reports she finished that on the 12th.  Patient reports that she has had some pleuritic chest pain on the left.  That is consistent with the size of her pleural effusion is noted.  Patient reports that she has had cough with thick phlegm.  She quit smoking about 1 month ago.  Patient has no other complaints at this time. As stated above patient does not smoke.  She quit drinking about 1 month ago.  She does not do illicit drugs.  She is vaccinated for COVID.  Patient is full code. In context of patient's life-she recently lost her  job due to missing work for pancreatitis per her report. Review of Systems: As mentioned in the history of present illness. All other systems reviewed and are negative. Past Medical History:  Diagnosis Date   Abdominal pain    Alcohol abuse    B12 deficiency    Cancer (HCC)    squmaous cell skin cancer    Collagen vascular disease (HCC)    Hypokalemia    Liddle's syndrome    Obesity (BMI 35.0-39.9 without comorbidity)    Pancreatitis    Pleural effusion    Splenic hemorrhage    Past Surgical History:  Procedure Laterality Date   ADENOIDECTOMY     CHEST TUBE INSERTION     COLONOSCOPY WITH PROPOFOL N/A 10/06/2020   Procedure: COLONOSCOPY WITH PROPOFOL;  Surgeon: Lin Landsman, MD;  Location: [facility] ENDOSCOPY;  Service: Gastroenterology;  Laterality: N/A;  COVID POSITIVE 08/23/2020   ESOPHAGOGASTRODUODENOSCOPY (EGD) WITH PROPOFOL N/A 10/06/2020   Procedure: ESOPHAGOGASTRODUODENOSCOPY (EGD) WITH PROPOFOL;  Surgeon: Lin Landsman, MD;  Location: [place];  Service: Gastroenterology;  Laterality: N/A;   skin cancer removal Left    leg   TONSILLECTOMY     Social History:  reports that she has been smoking cigarettes. She has a 2.25 pack-year smoking history. She has never used smokeless tobacco. She reports current alcohol use of about 2.0 standard drinks of alcohol per week. She reports that she does not use  drugs.  No Known Allergies  Family History  Problem Relation Age of Onset   Hypertension Father    Anxiety disorder Father    Depression Father    Melanoma Paternal Grandmother        of skin   Diabetes Paternal Grandmother    Cirrhosis Paternal Uncle     Prior to Admission medications   Medication Sig Start Date End Date Taking? Authorizing Provider  amLODipine (NORVASC) 10 MG tablet Take 1 tablet (10 mg total) by mouth daily. 04/10/22   Rubie Maid, FNP  cyanocobalamin (VITAMIN B12) 1000 MCG/ML injection Inject 1 mL (1,000 mcg total) into the muscle  every 30 (thirty) days. 05/17/22   [name], MD  folic acid (FOLVITE) 1 MG tablet Take 1 tablet (1 mg total) by mouth daily. 05/17/22 05/12/23  [name], MD  levofloxacin (LEVAQUIN) 500 MG tablet Take 1 tablet (500 mg total) by mouth daily. 06/14/22   Patrecia Pour, MD  metFORMIN (GLUCOPHAGE) 500 MG tablet Take 1 tablet (500 mg total) by mouth 2 (two) times daily with a meal. 05/18/22   [name], MD  potassium chloride SA (KLOR-CON M20) 20 MEQ tablet Take 3 tablets (60 mEq total) by mouth daily. Patient taking differently: Take 20 mEq by mouth daily. 05/21/22   Rubie Maid, FNP  spironolactone (ALDACTONE) 25 MG tablet Take 1 tablet (25 mg total) by mouth daily. Patient taking differently: Take 25 mg by mouth 2 (two) times daily. 05/13/22   Orson Eva, MD    Physical Exam: Vitals:   07/01/22 0000 07/01/22 0030 07/01/22 0100 07/01/22 0130  BP: 124/88 122/87 131/86 107/81  Pulse: 88 87 87 89  Resp: (!) '22 19 18 15  '$ Temp:   98 F (36.7 C)   TempSrc:   Oral   SpO2: 96% 96% 98% 97%  Weight:      Height:       1.  General: Patient lying supine in bed,  no acute distress   2. Psychiatric: Alert and oriented x 3, mood and behavior normal for situation, pleasant and cooperative with exam   3. Neurologic: Speech and language are normal, face is symmetric, moves all 4 extremities voluntarily, at baseline without acute deficits on limited exam   4. HEENMT:  Head is atraumatic, normocephalic, pupils reactive to light, neck is supple, trachea is midline, mucous membranes are moist   5. Respiratory : Lungs are clear to auscultation bilaterally without wheezing, rhonchi, rales, no cyanosis, no increase in work of breathing or accessory muscle use   6. Cardiovascular : Heart rate normal, rhythm is regular, no murmurs, rubs or gallops, no peripheral edema, peripheral pulses palpated   7. Gastrointestinal:  Abdomen is soft, nondistended, tender with voluntary  guarding to the epigastric region, tender without guarding in the rest of the abdomen, bowel sounds active, no masses or organomegaly palpated   8. Skin:  Skin is warm, dry and intact without rashes, acute lesions, or ulcers on limited exam   9.Musculoskeletal:  No acute deformities or trauma, no asymmetry in tone, no peripheral edema, peripheral pulses palpated, no tenderness to palpation in the extremities  Data Reviewed: In the ED Temp 98.2, heart rate 94-1 26, respiratory rate 19-26, blood pressure 116/89-150/112, satting 85% on room air-97% on 2 L nasal cannula Leukocytosis at 13.9, hemoglobin 13.0 Chemistry shows a hypokalemia at 2.7, hypoalbuminemia 3.2, lipase 108 Lactic acid normal x2 Negative COVID and flu Given splenic hematoma type and  screen was done UA is negative for UTI Patient was started on cefepime and Flagyl She was given Dilaudid and fentanyl for pain He was given Zofran for nausea She was given potassium and magnesium for hypokalemia Admission was requested for recurrent acute pancreatitis  Assessment and Plan: * Acute on chronic pancreatitis (HCC) - Abdominal pain with leukocytosis, thrombocytosis, CT scan showing acute pancreatitis with interval decrease in size of the distal pancreatic body cystic lesion that was likely a pseudocyst.  Component of necrotizing pancreatitis cannot be excluded.  Perisplenic subcapsular hematoma still present-hold anticoagulation - Patient finished Levaquin on November 12 - Broad-spectrum antibiotic coverage with cefepime and Flagyl - Pain control with pain scale - Last lipid panel shows triglycerides of 115 - No new medications - Previously thought to be alcoholic pancreatitis, but patient has not had a drink in 1 month per her report - Continue to monitor  Diabetes mellitus type II, controlled ([place]) - Hold metformin - Sliding scale coverage - Glucose well controlled at 110 on first chemistry - N.p.o.  Malnutrition of  moderate degree - Hypoalbuminemia at 3.2 - Patient has been on the liquid only diet for a month per her report - N.p.o. at this time due to acute pancreatitis - May need nutrition consult prior to discharge  Hypokalemia - Potassium 2.7 - 10 mEq given in the ED - Continue with 40 mEq throughout the night - Monitor on telemetry  Essential hypertension - Continue amlodipine      Advance Care Planning:   Code Status: Prior full  Consults: None at this time  Family Communication: No family at bedside  Severity of Illness: The appropriate patient status for this patient is INPATIENT. Inpatient status is judged to be reasonable and necessary in order to provide the required intensity of service to ensure the patient's safety. The patient's presenting symptoms, physical exam findings, and initial radiographic and laboratory data in the context of their chronic comorbidities is felt to place them at high risk for further clinical deterioration. Furthermore, it is not anticipated that the patient will be medically stable for discharge from the hospital within 2 midnights of admission.   * I certify that at the point of admission it is my clinical judgment that the patient will require inpatient hospital care spanning beyond 2 midnights from the point of admission due to high intensity of service, high risk for further deterioration and high frequency of surveillance required.*  Author: Rolla Plate, DO 07/01/2022 2:20 AM  For on call review [email or web address].

## 2022-07-01 NOTE — Assessment & Plan Note (Addendum)
-   Likely related to pleural effusion - Pneumonia cannot be excluded on chest x-ray but no clinical symptoms - Patient has recently been on Levaquin and Omnicef plus doxycycline - Continue cefepime - Pain medications could also be contributing to this hypoxia - Wean off as tolerated - Continue to monitor

## 2022-07-01 NOTE — Assessment & Plan Note (Addendum)
-   Hold metformin - Sliding scale coverage - if BS less than 70 would add dextrose to IV fluid - N.p.o. CBG (last 3)  Recent Labs    07/02/22 0608 07/02/22 2349 07/03/22 0731  GLUCAP 75 79 82

## 2022-07-01 NOTE — ED Notes (Signed)
Dr. Zierle-Ghosh at bedside  

## 2022-07-01 NOTE — Progress Notes (Signed)
  Transition of Care [facility]) Screening Note   Patient Details  Name: Syla Devoss Date of Birth: 1986/05/27   Transition of Care [facility]) CM/SW Contact:    Iona Beard, [place] Phone Number: 07/01/2022, 11:27 AM    Transition of Care Department [facility]) has reviewed patient and no TOC needs have been identified at this time. We will continue to monitor patient advancement through interdisciplinary progression rounds. If new patient transition needs arise, please place a TOC consult.

## 2022-07-01 NOTE — Assessment & Plan Note (Addendum)
-   Hypoalbuminemia at 3.2 - Patient has been on the liquid only diet for a month per her report - only able to tolerate clear liquids at this time.  - obtain nutrition team consult

## 2022-07-01 NOTE — Consult Note (Addendum)
Maylon Peppers, M.D. Gastroenterology & Hepatology                                           Patient Name: Jillian Shepherd Account #: _0 @   MRN: [id number] Admission Date: 06/30/2022 Date of Evaluation:  07/01/2022 Time of Evaluation: 10:03 AM   Referring Physician: Irwin Brakeman, MD  Chief Complaint:  abdominal pain, pancreatitis  HPI:  This is a 36 y.o. female  with history of alcohol abuse complicated by alcoholic pancreatitis, HTN, Liddle syndrome, and recent hospitalization of recurrent pancreatitis complicated by splenic hematoma and splenic vein thrombosis, who came to the hospital due to worsening abdominal pain.  Patient reports that after being discharged from the hospital at [facility] 2 weeks ago, she had persistent pain in her mid abdomen.  However, she reports that for the last 2 days the pain became much worse and was unbearable so she decided to come to the ER.  She states that she did not drink any alcohol since being discharged from the hospital and was not staying on a liquid diet but did not see any improvement.  In fact, she reported worsening pain every time she had something to drink.  She reports having some nausea regularly which made her vomit x 4 yesterday.  She denies any melena or hematochezia but had some watery bowel movements x 2 since yesterday which had yellow color.  The patient was discharged from [facility] on 06/14/2022.  She was initially admitted to [facility] on 05/31/2022.  She was found to have severe recurrent alcoholic pancreatitis with presence of pseudocyst.  Was also found to have splenic vein thrombosis which was initially anticoagulated but the patient developed new onset of abdominal pain and was found to have a splenic hematoma.  She was [facility].  To [facility] for higher level of care.  At that time her diet was slowly advanced to liquid diet which she was barely able to tolerate.  She developed worsening shortness of  breath and was found to have left pleural effusion which was treated with a thoracentesis, however this recurred and she had to have a chest tube placed and was started on antibiotics for possible HAP.  No surgical or interventional radiology management was required for her splenic hematoma.  In the ED, she was tachycardic with heart rate of 126 bpm, blood pressure was 150/106, she was afebrile. Labs were remarkable for potassium of 2.7, sodium 135, chloride 93, normal renal function, AST was 15, ALT 7, total bili 1.3, alkaline phosphatase 94, CBC showed WBC 13.9, platelets 566 and hemoglobin of 13.0.  Lipase was 130. Urinalysis was largely unremarkable.  A CT of the abdomen pelvis with IV contrast showed changes suggestive of acute pancreatitis with interval decrease in the distal pancreatic 60 lesion.  Unclear if she had some component of necrotizing pancreatitis.  Stable perisplenic subcapsular hematoma of 14 x 8 cm.  There was presence of small ascites and mild hepatomegaly.  Past Medical History: SEE CHRONIC ISSSUES: Past Medical History:  Diagnosis Date   Abdominal pain    Alcohol abuse    B12 deficiency    Cancer ([place])    squmaous cell skin cancer    Collagen vascular disease (HCC)    Hypokalemia    Liddle's syndrome    Obesity (BMI 35.0-39.9 without comorbidity)    Pancreatitis  Pleural effusion    Splenic hemorrhage    Past Surgical History:  Past Surgical History:  Procedure Laterality Date   ADENOIDECTOMY     CHEST TUBE INSERTION     COLONOSCOPY WITH PROPOFOL N/A 10/06/2020   Procedure: COLONOSCOPY WITH PROPOFOL;  Surgeon: Lin Landsman, MD;  Location: [facility] ENDOSCOPY;  Service: Gastroenterology;  Laterality: N/A;  COVID POSITIVE 08/23/2020   ESOPHAGOGASTRODUODENOSCOPY (EGD) WITH PROPOFOL N/A 10/06/2020   Procedure: ESOPHAGOGASTRODUODENOSCOPY (EGD) WITH PROPOFOL;  Surgeon: Lin Landsman, MD;  Location: [place];  Service: Gastroenterology;  Laterality: N/A;    skin cancer removal Left    leg   TONSILLECTOMY     Family History:  Family History  Problem Relation Age of Onset   Hypertension Father    Anxiety disorder Father    Depression Father    Melanoma Paternal Grandmother        of skin   Diabetes Paternal Grandmother    Cirrhosis Paternal Uncle    Social History:  Social History   Tobacco Use   Smoking status: Every Day    Packs/day: 0.25    Years: 9.00    Total pack years: 2.25    Types: Cigarettes   Smokeless tobacco: Never  Vaping Use   Vaping Use: Never used  Substance Use Topics   Alcohol use: Yes    Alcohol/week: 2.0 standard drinks of alcohol    Types: 2 Cans of beer per week   Drug use: No    Home Medications:  Prior to Admission medications   Medication Sig Start Date End Date Taking? Authorizing Provider  amLODipine (NORVASC) 10 MG tablet Take 1 tablet (10 mg total) by mouth daily. 04/10/22   Rubie Maid, FNP  cyanocobalamin (VITAMIN B12) 1000 MCG/ML injection Inject 1 mL (1,000 mcg total) into the muscle every 30 (thirty) days. 05/17/22   [name], MD  folic acid (FOLVITE) 1 MG tablet Take 1 tablet (1 mg total) by mouth daily. 05/17/22 05/12/23  [name], MD  levofloxacin (LEVAQUIN) 500 MG tablet Take 1 tablet (500 mg total) by mouth daily. 06/14/22   Patrecia Pour, MD  metFORMIN (GLUCOPHAGE) 500 MG tablet Take 1 tablet (500 mg total) by mouth 2 (two) times daily with a meal. 05/18/22   [name], MD  potassium chloride SA (KLOR-CON M20) 20 MEQ tablet Take 3 tablets (60 mEq total) by mouth daily. Patient taking differently: Take 20 mEq by mouth daily. 05/21/22   Rubie Maid, FNP  spironolactone (ALDACTONE) 25 MG tablet Take 1 tablet (25 mg total) by mouth daily. Patient taking differently: Take 25 mg by mouth 2 (two) times daily. 05/13/22   Orson Eva, MD    Inpatient Medications:  Current Facility-Administered Medications:    0.9 % NaCl with KCl 40 mEq / L  infusion, ,  Intravenous, Continuous, Zierle-Ghosh, Asia B, DO, Last Rate: 100 mL/hr at 07/01/22 0109, New Bag at 07/01/22 0109   amLODipine (NORVASC) tablet 10 mg, 10 mg, Oral, Daily, Zierle-Ghosh, Asia B, DO   ceFEPIme (MAXIPIME) 2 g in sodium chloride 0.9 % 100 mL IVPB, 2 g, Intravenous, Q8H, Zierle-Ghosh, Asia B, DO, Last Rate: 200 mL/hr at 07/01/22 0323, 2 g at 07/01/22 0323   Chlorhexidine Gluconate Cloth 2 % PADS 6 each, 6 each, Topical, Daily, Johnson, Clanford L, MD   fentaNYL (SUBLIMAZE) injection 100 mcg, 100 mcg, Intravenous, Q2H PRN, Zierle-Ghosh, Asia B, DO, 100 mcg at 66/06/30 1601   folic acid (FOLVITE)  tablet 1 mg, 1 mg, Oral, Daily, Zierle-Ghosh, Asia B, DO   ibuprofen (ADVIL) tablet 400 mg, 400 mg, Oral, Q6H PRN, Zierle-Ghosh, Asia B, DO   insulin aspart (novoLOG) injection 0-15 Units, 0-15 Units, Subcutaneous, Q4H, Zierle-Ghosh, Asia B, DO   lactated ringers infusion, , Intravenous, Continuous, Zierle-Ghosh, Asia B, DO, Stopped at 07/01/22 0107   metroNIDAZOLE (FLAGYL) IVPB 500 mg, 500 mg, Intravenous, Q12H, Zierle-Ghosh, Asia B, DO, Last Rate: 100 mL/hr at 07/01/22 0533, 500 mg at 07/01/22 0533   morphine (PF) 4 MG/ML injection 4 mg, 4 mg, Intravenous, Q2H PRN, Zierle-Ghosh, Asia B, DO   ondansetron (ZOFRAN) tablet 4 mg, 4 mg, Oral, Q6H PRN **OR** ondansetron (ZOFRAN) injection 4 mg, 4 mg, Intravenous, Q6H PRN, Zierle-Ghosh, Asia B, DO   oxyCODONE (Oxy IR/ROXICODONE) immediate release tablet 5 mg, 5 mg, Oral, Q4H PRN, Zierle-Ghosh, Asia B, DO, 5 mg at 07/01/22 0531   spironolactone (ALDACTONE) tablet 25 mg, 25 mg, Oral, BID, Zierle-Ghosh, Asia B, DO, 25 mg at 07/01/22 0755 Allergies: Patient has no known allergies.  Complete Review of Systems: GENERAL: negative for malaise, night sweats HEENT: No changes in hearing or vision, no nose bleeds or other nasal problems. NECK: Negative for lumps, goiter, pain and significant neck swelling RESPIRATORY: Negative for cough,  wheezing CARDIOVASCULAR: Negative for chest pain, leg swelling, palpitations, orthopnea GI: SEE HPI MUSCULOSKELETAL: Negative for joint pain or swelling, back pain, and muscle pain. SKIN: Negative for lesions, rash PSYCH: Negative for sleep disturbance, mood disorder and recent psychosocial stressors. HEMATOLOGY Negative for prolonged bleeding, bruising easily, and swollen nodes. ENDOCRINE: Negative for cold or heat intolerance, polyuria, polydipsia and goiter. NEURO: negative for tremor, gait imbalance, syncope and seizures. The remainder of the review of systems is noncontributory.  Physical Exam: BP 115/77   Pulse 69   Temp 98 F (36.7 C) (Oral)   Resp 14   Ht _0  (1.6 m)   Wt 65.8 kg   LMP 05/29/2022   SpO2 98%   BMI 25.69 kg/m  GENERAL: The patient is AO x3, in no acute distress. HEENT: Head is normocephalic and atraumatic. EOMI are intact. Mouth is well hydrated and without lesions. NECK: Supple. No masses LUNGS: Clear to auscultation. No presence of rhonchi/wheezing/rales. Adequate chest expansion HEART: RRR, normal s1 and s2. ABDOMEN: tender diffusely but worse in the mid abdomen, no guarding, no peritoneal signs, and nondistended. BS +. No masses. EXTREMITIES: Without any cyanosis, clubbing, rash, lesions or edema. NEUROLOGIC: AOx3, no focal motor deficit. SKIN: no jaundice, no rashes  Laboratory Data CBC:     Component Value Date/Time   WBC 9.5 07/01/2022 0609   RBC 3.48 (L) 07/01/2022 0609   HGB 10.7 (L) 07/01/2022 0609   HCT 34.1 (L) 07/01/2022 0609   PLT 415 (H) 07/01/2022 0609   MCV 98.0 07/01/2022 0609   MCH 30.7 07/01/2022 0609   MCHC 31.4 07/01/2022 0609   RDW 17.4 (H) 07/01/2022 0609   LYMPHSABS 2.0 07/01/2022 0609   MONOABS 0.7 07/01/2022 0609   EOSABS 0.2 07/01/2022 0609   BASOSABS 0.0 07/01/2022 0609   COAG:  Lab Results  Component Value Date   INR 1.1 06/30/2022    BMP:     Latest Ref Rng & Units 07/01/2022    6:09 AM 06/30/2022     5:54 PM 06/21/2022    4:13 PM  BMP  Glucose 70 - 99 mg/dL 69  110  80   BUN 6 - 20 mg/dL 11  12  6   Creatinine 0.44 - 1.00 mg/dL 0.41  0.50  0.44   BUN/Creat Ratio 6 - 22 (calc)   14   Sodium 135 - 145 mmol/L 136  135  135   Potassium 3.5 - 5.1 mmol/L 2.9  2.7  3.3   Chloride 98 - 111 mmol/L 98  93  92   CO2 22 - 32 mmol/L 29  28  35   Calcium 8.9 - 10.3 mg/dL 8.1  9.1  9.2     HEPATIC:     Latest Ref Rng & Units 07/01/2022    6:09 AM 06/30/2022    5:54 PM 06/21/2022    4:13 PM  Hepatic Function  Total Protein 6.5 - 8.1 g/dL 6.3  8.0  7.8   Albumin 3.5 - 5.0 g/dL 2.4  3.2    AST 15 - 41 U/L _0 ALT 0 - 44 U/L _1 Alk Phosphatase 38 - 126 U/L 72  94    Total Bilirubin 0.3 - 1.2 mg/dL 1.2  1.3  0.5     CARDIAC:  Lab Results  Component Value Date   CKTOTAL 64 05/04/2019     Imaging: I personally reviewed and interpreted the available imaging.  Assessment & Plan: Topanga Alvelo is a 36 y.o. female  with history of alcohol abuse complicated by alcoholic pancreatitis, HTN, Liddle syndrome, and recent hospitalization of recurrent pancreatitis complicated by splenic hematoma and splenic vein thrombosis, who came to the hospital due to worsening abdominal pain.  The patient has presented recurrent abdominal pain in the setting of chronic pancreatitis.  She has some inflammatory changes in her most recent imaging which is consistent with acute on chronic pancreatitis.  Unfortunately, she had extensive inflammation and complications after her most recent episode of pancreatitis.  I had a thorough discussion with the patient regarding the severity of her inflammation.  I reviewed the images and consider the inflammatory findings are similar to prior, but is unclear if there is persistence of splenic vein thrombosis.  These images will need to be reviewed tomorrow with the on-call radiologist.  I did not observe any worsening mass effect of the inflammation, which is  reassuring.  For now, we will continue with conservative management and keeping her n.p.o. with pain control and IV fluid hydration.  If unable to advance her diet after a few days, may consider TPN versus NJ tube placement.  The patient understood and agreed.  Notably, the patient has had evaluation of other etiologies of her pancreatitis.  Last triglycerides were 115 on 05/10/2022, IgG level 4 levels.  Not currently taking any medications that could make her pancreatitis recur.  - Start clear liquid diet and advance diet as tolerated - c/w lactated ringer @ 125 cc/h -Pain control by primary team - Alcohol cessation  Maylon Peppers, MD [facility]

## 2022-07-01 NOTE — Assessment & Plan Note (Signed)
-   Continue amlodipine ?

## 2022-07-01 NOTE — ED Notes (Signed)
A/w NaCl with KCl 40 from [employer], [facility] aware and working on this

## 2022-07-01 NOTE — Assessment & Plan Note (Addendum)
-   Potassium 2.7, Pt has Liddle's syndrome and on chronic potassium replacement as well as spironolactone.  - continue IV replacement and following closely; replacing low magnesium 11/28, recheck in AM  - Monitor on telemetry

## 2022-07-02 DIAGNOSIS — I151 Hypertension secondary to other renal disorders: Secondary | ICD-10-CM | POA: Diagnosis not present

## 2022-07-02 DIAGNOSIS — J9601 Acute respiratory failure with hypoxia: Secondary | ICD-10-CM | POA: Diagnosis not present

## 2022-07-02 DIAGNOSIS — K859 Acute pancreatitis without necrosis or infection, unspecified: Secondary | ICD-10-CM | POA: Diagnosis not present

## 2022-07-02 DIAGNOSIS — E876 Hypokalemia: Secondary | ICD-10-CM | POA: Diagnosis not present

## 2022-07-02 LAB — COMPREHENSIVE METABOLIC PANEL
ALT: 5 U/L (ref 0–44)
AST: 14 U/L — ABNORMAL LOW (ref 15–41)
Albumin: 2.2 g/dL — ABNORMAL LOW (ref 3.5–5.0)
Alkaline Phosphatase: 64 U/L (ref 38–126)
Anion gap: 5 (ref 5–15)
BUN: 9 mg/dL (ref 6–20)
CO2: 28 mmol/L (ref 22–32)
Calcium: 8 mg/dL — ABNORMAL LOW (ref 8.9–10.3)
Chloride: 104 mmol/L (ref 98–111)
Creatinine, Ser: 0.3 mg/dL — ABNORMAL LOW (ref 0.44–1.00)
GFR, Estimated: >60 mL/min (ref >60)
Glucose, Bld: 85 mg/dL (ref 70–99)
Potassium: 2.9 mmol/L — ABNORMAL LOW (ref 3.5–5.1)
Sodium: 137 mmol/L (ref 135–145)
Total Bilirubin: 0.6 mg/dL (ref 0.3–1.2)
Total Protein: 5.8 g/dL — ABNORMAL LOW (ref 6.5–8.1)

## 2022-07-02 LAB — CBC WITH DIFFERENTIAL/PLATELET
Abs Immature Granulocytes: 0.02 10*3/uL (ref 0.00–0.07)
Basophils Absolute: 0 10*3/uL (ref 0.0–0.1)
Basophils Relative: 1 %
Eosinophils Absolute: 0.3 10*3/uL (ref 0.0–0.5)
Eosinophils Relative: 4 %
HCT: 30.6 % — ABNORMAL LOW (ref 36.0–46.0)
Hemoglobin: 9.7 g/dL — ABNORMAL LOW (ref 12.0–15.0)
Immature Granulocytes: 0 %
Lymphocytes Relative: 22 %
Lymphs Abs: 1.7 10*3/uL (ref 0.7–4.0)
MCH: 30.5 pg (ref 26.0–34.0)
MCHC: 31.7 g/dL (ref 30.0–36.0)
MCV: 96.2 fL (ref 80.0–100.0)
Monocytes Absolute: 0.5 10*3/uL (ref 0.1–1.0)
Monocytes Relative: 7 %
Neutro Abs: 5.3 10*3/uL (ref 1.7–7.7)
Neutrophils Relative %: 66 %
Platelets: 423 10*3/uL — ABNORMAL HIGH (ref 150–400)
RBC: 3.18 MIL/uL — ABNORMAL LOW (ref 3.87–5.11)
RDW: 17.2 % — ABNORMAL HIGH (ref 11.5–15.5)
WBC: 7.9 10*3/uL (ref 4.0–10.5)
nRBC: 0 % (ref 0.0–0.2)

## 2022-07-02 LAB — LIPASE, BLOOD: Lipase: 64 U/L — ABNORMAL HIGH (ref 11–51)

## 2022-07-02 LAB — GLUCOSE, CAPILLARY
Glucose-Capillary: 75 mg/dL (ref 70–99)
Glucose-Capillary: 79 mg/dL (ref 70–99)

## 2022-07-02 LAB — URINE CULTURE: Culture: NO GROWTH

## 2022-07-02 MED ORDER — FENTANYL CITRATE PF 50 MCG/ML IJ SOSY
50.0000 ug | PREFILLED_SYRINGE | INTRAMUSCULAR | Status: DC | PRN
  Administered 2022-07-02: 75 ug via INTRAVENOUS
  Filled 2022-07-02: qty 2

## 2022-07-02 MED ORDER — HYDROMORPHONE HCL 1 MG/ML IJ SOLN
0.5000 mg | INTRAMUSCULAR | Status: DC | PRN
  Administered 2022-07-02 – 2022-07-05 (×33): 1 mg via INTRAVENOUS
  Administered 2022-07-05: 0.5 mg via INTRAVENOUS
  Administered 2022-07-05 – 2022-07-12 (×64): 1 mg via INTRAVENOUS
  Filled 2022-07-02 (×99): qty 1

## 2022-07-02 MED ORDER — POTASSIUM CHLORIDE 10 MEQ/100ML IV SOLN
10.0000 meq | INTRAVENOUS | Status: AC
  Administered 2022-07-02 (×6): 10 meq via INTRAVENOUS
  Filled 2022-07-02 (×6): qty 100

## 2022-07-02 NOTE — Progress Notes (Signed)
Initial Nutrition Assessment  DOCUMENTATION CODES:   Non-severe (moderate) malnutrition in context of chronic illness  INTERVENTION:  Follow for diet advancement  Add Boost Breeze and ProSource 30 ml TID when ready for clear liquids  Multivitamin daily  NUTRITION DIAGNOSIS:   Moderate Malnutrition related to acute illness, chronic illness (acute on chronic pancreatitis) as evidenced by percent weight loss (8% x 1 month, 12.6% ~3 months and 17% x 5 months), mild muscle depletion, liquid diet 1 month PTA.  GOAL:  Patient will meet greater than or equal to 90% of their needs   MONITOR:  Diet advancement, Labs, Weight trends  REASON FOR ASSESSMENT:   Malnutrition Screening Tool    ASSESSMENT: Patient is a 36 yo female with hx of DM2, ETOH use, Liddle syndrome, B-12 deficiency, hypokalemia and moderate malnutrition. Presents with acute on chronic pancreatitis.   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 She has been on liquid diet since 05/27/22. Primarily consuming Ensure, apple and grape juice and likes Mango Ice. Expressed feeling apprehensive about resuming oral intake.   Patient is NPO and per chart has been on liquid diet the past month. Significant weight loss - 8% x 1 month, 12.6% ~3 months and 17% x 5 months. Energy intake < 75%  for >/= 1 month.  Medications reviewed and include: folic acid, protonix.   IVF Lactated Ringers @ 75 ml/hr     Latest Ref Rng & Units 07/02/2022    3:38 AM 07/01/2022    6:09 AM 06/30/2022    5:54 PM  BMP  Glucose 70 - 99 mg/dL 85  69  110   BUN 6 - 20 mg/dL '9  11  12   '$ Creatinine 0.44 - 1.00 mg/dL 0.30  0.41  0.50   Sodium 135 - 145 mmol/L 137  136  135   Potassium 3.5 - 5.1 mmol/L 2.9  2.9  2.7   Chloride 98 - 111 mmol/L 104  98  93   CO2 22 - 32 mmol/L '28  29  28   '$ Calcium 8.9 - 10.3 mg/dL 8.0  8.1  9.1       NUTRITION - FOCUSED PHYSICAL EXAM:  Flowsheet Row Most Recent Value  Orbital Region Mild depletion   Upper Arm Region No depletion  Thoracic and Lumbar Region No depletion  Buccal Region No depletion  Temple Region Mild depletion  Clavicle Bone Region Mild depletion  Clavicle and Acromion Bone Region No depletion  Scapular Bone Region No depletion  Dorsal Hand Mild depletion  Patellar Region Moderate depletion  Anterior Thigh Region Mild depletion  Posterior Calf Region No depletion  Edema (RD Assessment) Mild  Hair Reviewed  Eyes Reviewed  Mouth Reviewed  Skin Reviewed  Nails Reviewed       Diet Order:   Diet Order             Diet NPO time specified Except for: Ice Chips, Sips with Meds  Diet effective now                   EDUCATION NEEDS:  No education needs have been identified at this time  Skin:  Skin Assessment: Reviewed RN Assessment  Last BM:  11/25  Height:   Ht Readings from Last 1 Encounters:  07/02/22 '5\' 3"'$  (1.6 m)    Weight:   Wt Readings from Last 1 Encounters:  07/02/22 75 kg    Ideal Body Weight:   36 yo   BMI:  Body mass index is 29.29 kg/m.  Estimated Nutritional Needs:   Kcal:  1800-2000  Protein:  99-110 gr  Fluid:  1.8-2.0 liters daily  Colman Cater MS,RD,CSG,LDN Contact: Shea Evans

## 2022-07-02 NOTE — Hospital Course (Signed)
36 y.o. female with medical history significant of alcohol abuse, B12 deficiency, hypokalemia, Liddle syndrome, history of splenic hemorrhage with splenic subcapsular hematoma, presents to the ED with a chief complaint of abdominal pain.  Patient was discharged about 2 weeks ago.  She reports that her pain was never improved since discharge.  It was much worse today.  She describes it as a throbbing pain in her epigastric region radiating around to her back, and down midline abdomen.  It is sharp.  Sometimes it feels like squeezing.  At its worst is a 10 out of 10, after 100 mcg of fentanyl and says 7-8 out of 10.  Patient reports she had nausea with nonbloody emesis x4.  Her last normal liquid meal was at noon.  Her last normal bowel movement was this morning.  She reports that the color is bright yellow, but otherwise it was normal bowel movement for her.  It was nonbloody.  Patient reports she has been on a strict full liquid, but as she describes it it sounds more like a soft diet with applesauce, yogurt, Ensure shakes.  Patient was discharged on Levaquin previously she reports she finished that on the 12th.  Patient reports that she has had some pleuritic chest pain on the left.  That is consistent with the size of her pleural effusion is noted.  Patient reports that she has had cough with thick phlegm.  She quit smoking about 1 month ago.  Patient has no other complaints at this time. As stated above patient does not smoke.  She quit drinking about 1 month ago.  She does not do illicit drugs.  She is vaccinated for COVID.  Patient is full code. In context of patient's life-she recently lost her job due to missing work for pancreatitis per her report.

## 2022-07-02 NOTE — Progress Notes (Signed)
Gastroenterology Progress Note    Primary Care Physician:  [name], MD Primary Gastroenterologist:  [facility]   Patient ID: Jillian Shepherd; [id number]; 03/24/1986    Subjective   Abdominal pain slowly improving. Mild nausea, no vomiting. +flatus. Feels she has had some slight improvement since admission. States she had difficulty advancing diet as outpatient and did not move past fulls.   Objective   Vital signs in last 24 hours Temp:  [97.5 F (36.4 C)-97.9 F (36.6 C)] 97.5 F (36.4 C) (11/27 0727) Pulse Rate:  [52-82] 62 (11/27 0800) Resp:  [12-20] 17 (11/27 0800) BP: (96-145)/(62-98) 132/92 (11/27 0800) SpO2:  [95 %-100 %] 98 % (11/27 0800) Weight:  [75 kg] 75 kg (11/27 0436) Last BM Date : 06/30/22  Physical Exam General:   Alert and oriented, pleasant Head:  Normocephalic and atraumatic. Abdomen:  Bowel sounds present, soft, TTP epigastric without rebound or guarding.   Msk:  Symmetrical without gross deformities. Normal posture. Neurologic:  Alert and  oriented x4   Intake/Output from previous day: 11/26 0701 - 11/27 0700 In: 2978.7 [P.O.:120; I.V.:2175.8; IV Piggyback:682.8] Out: -  Intake/Output this shift: Total I/O In: 120 [P.O.:120] Out: -   Lab Results  Recent Labs    06/30/22 1754 07/01/22 0609 07/02/22 0338  WBC 13.9* 9.5 7.9  HGB 13.0 10.7* 9.7*  HCT 39.5 34.1* 30.6*  PLT 566* 415* 423*   BMET Recent Labs    06/30/22 1754 07/01/22 0609 07/02/22 0338  NA 135 136 137  K 2.7* 2.9* 2.9*  CL 93* 98 104  CO2 '28 29 28  '$ GLUCOSE 110* 69* 85  BUN '12 11 9  '$ CREATININE 0.50 0.41* 0.30*  CALCIUM 9.1 8.1* 8.0*   LFT Recent Labs    06/30/22 1754 07/01/22 0609 07/02/22 0338  PROT 8.0 6.3* 5.8*  ALBUMIN 3.2* 2.4* 2.2*  AST 15 12* 14*  ALT '7 6 5  '$ ALKPHOS 94 72 64  BILITOT 1.3* 1.2 0.6   PT/INR Recent Labs    06/30/22 1754  LABPROT 14.2  INR 1.1    Studies/Results CT ABDOMEN PELVIS W CONTRAST  Result Date:  06/30/2022 CLINICAL DATA:  Abdominal pain, acute, nonlocalized diffuse pain, recent pancreatitis EXAM: CT ABDOMEN AND PELVIS WITH CONTRAST TECHNIQUE: Multidetector CT imaging of the abdomen and pelvis was performed using the standard protocol following bolus administration of intravenous contrast. RADIATION DOSE REDUCTION: This exam was performed according to the departmental dose-optimization program which includes automated exposure control, adjustment of the mA and/or kV according to patient size and/or use of iterative reconstruction technique. CONTRAST:  145m OMNIPAQUE IOHEXOL 300 MG/ML  SOLN COMPARISON:  None Available. FINDINGS: Lower chest: Trace bilateral, left greater than right, pleural effusion. Associated passive atelectasis of left lower lobe. Hepatobiliary: The liver is enlarged measuring up to 19 cm. No focal liver abnormality. No gallstones or definite gallbladder wall thickening. Positive for pericholecystic fluid. No biliary dilatation. Pancreas: No focal lesion. Hazy pancreatic contour with associated peripancreatic fat stranding and free fluid. Slightly decreased in size cystic lesion measuring 2 x 2 cm along the distal pancreatic body. Poorly visualized distal pancreatic tail likely due to cystic changes and edema with underlying nonenhancement of the pancreatic tail not fully excluded. Interval resolution of cystic lesion along the greater curvature of the stomach. No main pancreatic ductal dilatation. Spleen: Similar-appearing heterogeneous 14 x 8 cm perisplenic subcapsular lesion likely representing a hematoma. Adrenals/Urinary Tract: No focal lesion. Normal pancreatic contour. No surrounding inflammatory changes. No main  pancreatic ductal dilatation. Stomach/Bowel: Stomach is within normal limits. No evidence of bowel wall thickening or dilatation. Appendix appears normal. Vascular/Lymphatic: No splenic artery aneurysm identified. Venous collaterals noted. No abdominal aorta or iliac  aneurysm. Mild atherosclerotic plaque of the aorta and its branches. No abdominal, pelvic, or inguinal lymphadenopathy. Reproductive: Uterus and bilateral adnexa are unremarkable. Other: Small volume simple free fluid ascites. No intraperitoneal free gas. No organized fluid collection. Musculoskeletal: No abdominal wall hernia or abnormality. Soft tissue density along the anterior abdominal wall likely due to medication injection. No suspicious lytic or blastic osseous lesions. No acute displaced fracture. Multilevel degenerative changes of the spine. IMPRESSION: 1. Acute pancreatitis with interval decrease in size of a distal pancreatic body cystic lesion likely representing a pseudocyst. Interval resolution of cystic lesion along the greater curvature of the stomach. Component of necrotizing pancreatitis along the pancreatic tail is not fully excluded. 2. Similar-appearing heterogeneous 14 x 8 cm perisplenic subcapsular hematoma. 3. Nonspecific pericholecystic fluid with no definite CT findings acute cholecystitis. 4. Small volume simple free fluid ascites. 5. Trace bilateral, left greater than right, pleural effusion. 6. Mild hepatomegaly. 7. Venous collaterals. Electronically Signed   By: Iven Finn M.D.   On: 06/30/2022 22:45   DG Chest Port 1 View  Result Date: 06/30/2022 CLINICAL DATA:  Recent effusion with decreased lung sounds on the left EXAM: PORTABLE CHEST 1 VIEW COMPARISON:  Radiographs 06/13/2022 FINDINGS: Slight decrease in small left pleural effusion and associated atelectasis since 06/13/2022. Possible trace right pleural effusion. No pneumothorax. Stable cardiomediastinal silhouette. No acute osseous abnormality. IMPRESSION: Since 06/13/2022, decreased small left pleural effusion and associated atelectasis. Pneumonia is difficult to exclude. Electronically Signed   By: Placido Sou M.D.   On: 06/30/2022 18:26   Lab Results  Component Value Date   VITAMINB12 81 (L) 05/10/2022    Lab Results  Component Value Date   FOLATE 3.8 (L) 05/10/2022      Assessment  36 y.o. female with a history of alcohol abuse complicated by alcoholic pancreatitis, HTN, Liddle syndrome, and recent hospitalization due to recurrent pancreatitis complicated by splenic hematoma and splenic vein thrombosis, presenting this admission with acute on chronic abdominal pain.   Acute on chronic pancreatitis: CT with acute pancreatitis but decrease in distal pancreatic body cystic lesion, felt to represent pseudocyst. Unable to exclude necrotizing pancreatitis. Similar perisplenic subcapsular hematoma. Non-specific pericholecystic fluid without obvious findings of acute cholecystitis. Prior evaluation for pancreatitis with triglycerides 115 in Oct 2023, normal IgG 4 levels. Clinically with some improvement today and appropriate for clear liquids. May need TPN if unable to advance diet, as she has been on liquids/fulls since last hospitalization and unable to advance.   History of splenic vein thrombosis: will need to review CT with Radiology today to evaluate if persistent thrombosis. Started on anticoagulation during prior admission but developed splenic hematoma.   Anemia: Hgb 9.7 this morning. Appears her most recent values over the past month have been in 9/10 range.   Plan / Recommendations  Clear liquid diet Continue supportive measures Will review CT with Radiology regarding history of splenic vein thrombosis May need additional nutritional support if unable to advance diet Potassium replacement per attending    LOS: 2 days    07/02/2022, 8:52 AM  Annitta Needs, PhD, ANP-BC [facility]

## 2022-07-02 NOTE — Progress Notes (Signed)
PROGRESS NOTE   Jillian Shepherd  LXB:[id number] DOB: 1985/09/23 DOA: 06/30/2022 PCP: [name], MD   Chief Complaint  Patient presents with   Abdominal Pain   Level of care: Stepdown  Brief Admission History:  36 y.o. female with medical history significant of alcohol abuse, B12 deficiency, hypokalemia, Liddle syndrome, history of splenic hemorrhage with splenic subcapsular hematoma, presents to the ED with a chief complaint of abdominal pain.  Patient was discharged about 2 weeks ago.  She reports that her pain was never improved since discharge.  It was much worse today.  She describes it as a throbbing pain in her epigastric region radiating around to her back, and down midline abdomen.  It is sharp.  Sometimes it feels like squeezing.  At its worst is a 10 out of 10, after 100 mcg of fentanyl and says 7-8 out of 10.  Patient reports she had nausea with nonbloody emesis x4.  Her last normal liquid meal was at noon.  Her last normal bowel movement was this morning.  She reports that the color is bright yellow, but otherwise it was normal bowel movement for her.  It was nonbloody.  Patient reports she has been on a strict full liquid, but as she describes it it sounds more like a soft diet with applesauce, yogurt, Ensure shakes.  Patient was discharged on Levaquin previously she reports she finished that on the 12th.  Patient reports that she has had some pleuritic chest pain on the left.  That is consistent with the size of her pleural effusion is noted.  Patient reports that she has had cough with thick phlegm.  She quit smoking about 1 month ago.  Patient has no other complaints at this time. As stated above patient does not smoke.  She quit drinking about 1 month ago.  She does not do illicit drugs.  She is vaccinated for COVID.  Patient is full code. In context of patient's life-she recently lost her job due to missing work for pancreatitis per her report.   Assessment and Plan: *  Acute on chronic pancreatitis (HCC) - Abdominal pain with leukocytosis, thrombocytosis, CT scan showing acute pancreatitis with interval decrease in size of the distal pancreatic body cystic lesion that was likely a pseudocyst.  Component of necrotizing pancreatitis cannot be excluded.  Perisplenic subcapsular hematoma still present-hold anticoagulation - Patient finished Levaquin on November 12 - Broad-spectrum antibiotic coverage with cefepime and Flagyl - Pain control with pain scale - Last lipid panel shows triglycerides of 115 - No new medications - Previously thought to be alcoholic pancreatitis, but patient has not had a drink in 1 month per her report - Pt reports pain not controlled with current regimen, changed to dilaudid 11/27  Diabetes mellitus type II, controlled ([place]) - Hold metformin - Sliding scale coverage - Glucose well controlled at 110 on first chemistry - N.p.o. CBG (last 3)  Recent Labs    07/01/22 0326 07/01/22 0746 07/02/22 0608  GLUCAP 77 77 75     Malnutrition of moderate degree - Hypoalbuminemia at 3.2 - Patient has been on the liquid only diet for a month per her report - N.p.o. at this time due to acute pancreatitis - obtain nutrition consult when allowed to take p.o. again  Acute hypoxic respiratory failure ([place]) - Likely related to pleural effusion - Pneumonia cannot be excluded on chest x-ray but no clinical symptoms - Patient has recently been on Levaquin and Omnicef plus doxycycline - Continue cefepime -  Pain medications could also be contributing to this hypoxia - Wean off as tolerated - Continue to monitor  Hypokalemia - Potassium 2.7, Pt has Liddle's syndrome  - continue IV replacement and following closely - Monitor on telemetry  Essential hypertension - Continue amlodipine  DVT prophylaxis: SCDs Code Status: full  Family Communication:  Disposition: Status is: Inpatient Remains inpatient appropriate because: IV pain  management, IV fluid    Consultants:  GI service  Procedures:   Antimicrobials:    Subjective: Pt reports pain medicine wears off before next scheduled dose.   Objective: Vitals:   07/02/22 0600 07/02/22 0727 07/02/22 0800 07/02/22 0900  BP: (!) 142/98  (!) 132/92 126/84  Pulse: 60 81 62 70  Resp: '15 16 17 15  '$ Temp:  (!) 97.5 F (36.4 C)    TempSrc:  Oral    SpO2: 98% 99% 98% 97%  Weight:      Height:        Intake/Output Summary (Last 24 hours) at 07/02/2022 0938 Last data filed at 07/02/2022 0841 Gross per 24 hour  Intake 2642.73 ml  Output --  Net 2642.73 ml   Filed Weights   06/30/22 1722 07/02/22 0436  Weight: 65.8 kg 75 kg   Examination:  General exam: Appears calm and comfortable. NO DISTRESS SEEN. Respiratory system: Clear to auscultation. Respiratory effort normal. Cardiovascular system: normal S1 & S2 heard. No JVD, murmurs, rubs, gallops or clicks. No pedal edema. Gastrointestinal system: Abdomen is nondistended, soft and with mild epigastric nontenderness, no guarding. No organomegaly or masses felt. Normal bowel sounds heard. Central nervous system: Alert and oriented. No focal neurological deficits. Extremities: Symmetric 5 x 5 power. Skin: No rashes, lesions or ulcers. Psychiatry: Judgement and insight appear poor. Mood & affect appropriate.   Data Reviewed: I have personally reviewed following labs and imaging studies  CBC: Recent Labs  Lab 06/30/22 1754 07/01/22 0609 07/02/22 0338  WBC 13.9* 9.5 7.9  NEUTROABS  --  6.6 5.3  HGB 13.0 10.7* 9.7*  HCT 39.5 34.1* 30.6*  MCV 94.7 98.0 96.2  PLT 566* 415* 423*    Basic Metabolic Panel: Recent Labs  Lab 06/30/22 1754 07/01/22 0609 07/02/22 0338  NA 135 136 137  K 2.7* 2.9* 2.9*  CL 93* 98 104  CO2 '28 29 28  '$ GLUCOSE 110* 69* 85  BUN '12 11 9  '$ CREATININE 0.50 0.41* 0.30*  CALCIUM 9.1 8.1* 8.0*  MG  --  2.1  --     CBG: Recent Labs  Lab 07/01/22 0326 07/01/22 0746 07/02/22 0608   GLUCAP 77 77 75    Recent Results (from the past 240 hour(s))  Resp Panel by RT-PCR (Flu A&B, Covid) Anterior Nasal Swab     Status: None   Collection Time: 06/30/22  6:02 PM   Specimen: Anterior Nasal Swab  Result Value Ref Range Status   SARS Coronavirus 2 by RT PCR NEGATIVE NEGATIVE Final    Comment: (NOTE) SARS-CoV-2 target nucleic acids are NOT DETECTED.  The SARS-CoV-2 RNA is generally detectable in upper respiratory specimens during the acute phase of infection. The lowest concentration of SARS-CoV-2 viral copies this assay can detect is 138 copies/mL. A negative result does not preclude SARS-Cov-2 infection and should not be used as the sole basis for treatment or other patient management decisions. A negative result may occur with  improper specimen collection/handling, submission of specimen other than nasopharyngeal swab, presence of viral mutation(s) within the areas targeted by this assay, and  inadequate number of viral copies(<138 copies/mL). A negative result must be combined with clinical observations, patient history, and epidemiological information. The expected result is Negative.  Fact Sheet for Patients:  EntrepreneurPulse.com.au  Fact Sheet for Healthcare Providers:  IncredibleEmployment.be  This test is no t yet approved or cleared by the [place] FDA and  has been authorized for detection and/or diagnosis of SARS-CoV-2 by FDA under an Emergency Use Authorization (EUA). This EUA will remain  in effect (meaning this test can be used) for the duration of the COVID-19 declaration under Section 564(b)(1) of the Act, 21 U.S.C.section 360bbb-3(b)(1), unless the authorization is terminated  or revoked sooner.       Influenza A by PCR NEGATIVE NEGATIVE Final   Influenza B by PCR NEGATIVE NEGATIVE Final    Comment: (NOTE) The Xpert Xpress SARS-CoV-2/FLU/RSV plus assay is intended as an aid in the diagnosis of  influenza from Nasopharyngeal swab specimens and should not be used as a sole basis for treatment. Nasal washings and aspirates are unacceptable for Xpert Xpress SARS-CoV-2/FLU/RSV testing.  Fact Sheet for Patients: EntrepreneurPulse.com.au  Fact Sheet for Healthcare Providers: IncredibleEmployment.be  This test is not yet approved or cleared by the [place] FDA and has been authorized for detection and/or diagnosis of SARS-CoV-2 by FDA under an Emergency Use Authorization (EUA). This EUA will remain in effect (meaning this test can be used) for the duration of the COVID-19 declaration under Section 564(b)(1) of the Act, 21 U.S.C. section 360bbb-3(b)(1), unless the authorization is terminated or revoked.  Performed at [facility], [address], [place], [place] [address]   Blood culture (routine x 2)     Status: None (Preliminary result)   Collection Time: 06/30/22  6:13 PM   Specimen: BLOOD LEFT FOREARM  Result Value Ref Range Status   Specimen Description   Final    BLOOD LEFT FOREARM BOTTLES DRAWN AEROBIC AND ANAEROBIC   Special Requests Blood Culture adequate volume  Final   Culture   Final    NO GROWTH 2 DAYS Performed at [facility], [address], [place], [place] [address]    Report Status PENDING  Incomplete  Blood culture (routine x 2)     Status: None (Preliminary result)   Collection Time: 06/30/22  6:29 PM   Specimen: Right Antecubital; Blood  Result Value Ref Range Status   Specimen Description   Final    RIGHT ANTECUBITAL BOTTLES DRAWN AEROBIC AND ANAEROBIC   Special Requests Blood Culture adequate volume  Final   Culture   Final    NO GROWTH 2 DAYS Performed at [facility], [address], [place], [place] [address]    Report Status PENDING  Incomplete  MRSA Next Gen by PCR, Nasal     Status: None   Collection Time: 07/01/22  3:09 AM   Specimen: Nasal Mucosa; Nasal Swab  Result Value Ref Range Status   MRSA by  PCR Next Gen NOT DETECTED NOT DETECTED Final    Comment: (NOTE) The GeneXpert MRSA Assay (FDA approved for NASAL specimens only), is one component of a comprehensive MRSA colonization surveillance program. It is not intended to diagnose MRSA infection nor to guide or monitor treatment for MRSA infections. Test performance is not FDA approved in patients less than 14 years old. Performed at [facility], [address] [place], [place] [address]      Radiology Studies: CT ABDOMEN PELVIS W CONTRAST  Result Date: 06/30/2022 CLINICAL DATA:  Abdominal pain, acute, nonlocalized diffuse  pain, recent pancreatitis EXAM: CT ABDOMEN AND PELVIS WITH CONTRAST TECHNIQUE: Multidetector CT imaging of the abdomen and pelvis was performed using the standard protocol following bolus administration of intravenous contrast. RADIATION DOSE REDUCTION: This exam was performed according to the departmental dose-optimization program which includes automated exposure control, adjustment of the mA and/or kV according to patient size and/or use of iterative reconstruction technique. CONTRAST:  147m OMNIPAQUE IOHEXOL 300 MG/ML  SOLN COMPARISON:  None Available. FINDINGS: Lower chest: Trace bilateral, left greater than right, pleural effusion. Associated passive atelectasis of left lower lobe. Hepatobiliary: The liver is enlarged measuring up to 19 cm. No focal liver abnormality. No gallstones or definite gallbladder wall thickening. Positive for pericholecystic fluid. No biliary dilatation. Pancreas: No focal lesion. Hazy pancreatic contour with associated peripancreatic fat stranding and free fluid. Slightly decreased in size cystic lesion measuring 2 x 2 cm along the distal pancreatic body. Poorly visualized distal pancreatic tail likely due to cystic changes and edema with underlying nonenhancement of the pancreatic tail not fully excluded. Interval resolution of cystic lesion along the greater curvature of the stomach. No  main pancreatic ductal dilatation. Spleen: Similar-appearing heterogeneous 14 x 8 cm perisplenic subcapsular lesion likely representing a hematoma. Adrenals/Urinary Tract: No focal lesion. Normal pancreatic contour. No surrounding inflammatory changes. No main pancreatic ductal dilatation. Stomach/Bowel: Stomach is within normal limits. No evidence of bowel wall thickening or dilatation. Appendix appears normal. Vascular/Lymphatic: No splenic artery aneurysm identified. Venous collaterals noted. No abdominal aorta or iliac aneurysm. Mild atherosclerotic plaque of the aorta and its branches. No abdominal, pelvic, or inguinal lymphadenopathy. Reproductive: Uterus and bilateral adnexa are unremarkable. Other: Small volume simple free fluid ascites. No intraperitoneal free gas. No organized fluid collection. Musculoskeletal: No abdominal wall hernia or abnormality. Soft tissue density along the anterior abdominal wall likely due to medication injection. No suspicious lytic or blastic osseous lesions. No acute displaced fracture. Multilevel degenerative changes of the spine. IMPRESSION: 1. Acute pancreatitis with interval decrease in size of a distal pancreatic body cystic lesion likely representing a pseudocyst. Interval resolution of cystic lesion along the greater curvature of the stomach. Component of necrotizing pancreatitis along the pancreatic tail is not fully excluded. 2. Similar-appearing heterogeneous 14 x 8 cm perisplenic subcapsular hematoma. 3. Nonspecific pericholecystic fluid with no definite CT findings acute cholecystitis. 4. Small volume simple free fluid ascites. 5. Trace bilateral, left greater than right, pleural effusion. 6. Mild hepatomegaly. 7. Venous collaterals. Electronically Signed   By: MIven FinnM.D.   On: 06/30/2022 22:45   DG Chest Port 1 View  Result Date: 06/30/2022 CLINICAL DATA:  Recent effusion with decreased lung sounds on the left EXAM: PORTABLE CHEST 1 VIEW COMPARISON:   Radiographs 06/13/2022 FINDINGS: Slight decrease in small left pleural effusion and associated atelectasis since 06/13/2022. Possible trace right pleural effusion. No pneumothorax. Stable cardiomediastinal silhouette. No acute osseous abnormality. IMPRESSION: Since 06/13/2022, decreased small left pleural effusion and associated atelectasis. Pneumonia is difficult to exclude. Electronically Signed   By: TPlacido SouM.D.   On: 06/30/2022 18:26    Scheduled Meds:  amLODipine  10 mg Oral Daily   Chlorhexidine Gluconate Cloth  6 each Topical Daily   folic acid  1 mg Oral Daily   pantoprazole (PROTONIX) IV  40 mg Intravenous Q24H   spironolactone  25 mg Oral BID   Continuous Infusions:  ceFEPime (MAXIPIME) IV 2 g (07/02/22 0317)   lactated ringers 125 mL/hr at 07/02/22 0010   metronidazole 500 mg (07/02/22 08563  potassium chloride 10 mEq (07/02/22 0926)     LOS: 2 days   Time spent: 37 mins  Iisha Soyars Wynetta Emery, MD How to contact the [facility] Attending or Consulting provider [place] or covering provider during after hours [place], for this patient?  Check the care team in [facility] and look for a) attending/consulting [facility] provider listed and b) the [facility] team listed Log into [email or web address] and use [place]'s universal password to access. If you do not have the password, please contact the hospital operator. Locate the [facility] provider you are looking for under [facility] and page to a number that you can be directly reached. If you still have difficulty reaching the provider, please page the [facility] (Director on Call) for the Hospitalists listed on amion for assistance.  07/02/2022, 9:38 AM

## 2022-07-03 DIAGNOSIS — E440 Moderate protein-calorie malnutrition: Secondary | ICD-10-CM | POA: Diagnosis not present

## 2022-07-03 DIAGNOSIS — I10 Essential (primary) hypertension: Secondary | ICD-10-CM | POA: Diagnosis not present

## 2022-07-03 DIAGNOSIS — K861 Other chronic pancreatitis: Secondary | ICD-10-CM

## 2022-07-03 DIAGNOSIS — K859 Acute pancreatitis without necrosis or infection, unspecified: Secondary | ICD-10-CM

## 2022-07-03 DIAGNOSIS — E119 Type 2 diabetes mellitus without complications: Secondary | ICD-10-CM | POA: Diagnosis not present

## 2022-07-03 LAB — COMPREHENSIVE METABOLIC PANEL
ALT: 7 U/L (ref 0–44)
AST: 12 U/L — ABNORMAL LOW (ref 15–41)
Albumin: 2.3 g/dL — ABNORMAL LOW (ref 3.5–5.0)
Alkaline Phosphatase: 65 U/L (ref 38–126)
Anion gap: 4 — ABNORMAL LOW (ref 5–15)
BUN: 7 mg/dL (ref 6–20)
CO2: 28 mmol/L (ref 22–32)
Calcium: 8.3 mg/dL — ABNORMAL LOW (ref 8.9–10.3)
Chloride: 104 mmol/L (ref 98–111)
Creatinine, Ser: 0.3 mg/dL — ABNORMAL LOW (ref 0.44–1.00)
GFR, Estimated: >60 mL/min (ref >60)
Glucose, Bld: 66 mg/dL — ABNORMAL LOW (ref 70–99)
Potassium: 3.2 mmol/L — ABNORMAL LOW (ref 3.5–5.1)
Sodium: 136 mmol/L (ref 135–145)
Total Bilirubin: 0.6 mg/dL (ref 0.3–1.2)
Total Protein: 6 g/dL — ABNORMAL LOW (ref 6.5–8.1)

## 2022-07-03 LAB — CBC WITH DIFFERENTIAL/PLATELET
Abs Immature Granulocytes: 0.03 10*3/uL (ref 0.00–0.07)
Basophils Absolute: 0 10*3/uL (ref 0.0–0.1)
Basophils Relative: 1 %
Eosinophils Absolute: 0.3 10*3/uL (ref 0.0–0.5)
Eosinophils Relative: 4 %
HCT: 32.5 % — ABNORMAL LOW (ref 36.0–46.0)
Hemoglobin: 10.2 g/dL — ABNORMAL LOW (ref 12.0–15.0)
Immature Granulocytes: 0 %
Lymphocytes Relative: 26 %
Lymphs Abs: 1.8 10*3/uL (ref 0.7–4.0)
MCH: 30.9 pg (ref 26.0–34.0)
MCHC: 31.4 g/dL (ref 30.0–36.0)
MCV: 98.5 fL (ref 80.0–100.0)
Monocytes Absolute: 0.5 10*3/uL (ref 0.1–1.0)
Monocytes Relative: 8 %
Neutro Abs: 4.2 10*3/uL (ref 1.7–7.7)
Neutrophils Relative %: 61 %
Platelets: 389 10*3/uL (ref 150–400)
RBC: 3.3 MIL/uL — ABNORMAL LOW (ref 3.87–5.11)
RDW: 17.2 % — ABNORMAL HIGH (ref 11.5–15.5)
WBC: 6.9 10*3/uL (ref 4.0–10.5)
nRBC: 0 % (ref 0.0–0.2)

## 2022-07-03 LAB — GLUCOSE, CAPILLARY
Glucose-Capillary: 82 mg/dL (ref 70–99)
Glucose-Capillary: 87 mg/dL (ref 70–99)

## 2022-07-03 LAB — MAGNESIUM: Magnesium: 1.5 mg/dL — ABNORMAL LOW (ref 1.7–2.4)

## 2022-07-03 LAB — LIPASE, BLOOD: Lipase: 80 U/L — ABNORMAL HIGH (ref 11–51)

## 2022-07-03 MED ORDER — POTASSIUM CHLORIDE 2 MEQ/ML IV SOLN
INTRAVENOUS | Status: DC
  Filled 2022-07-03 (×13): qty 1000

## 2022-07-03 MED ORDER — GLUCOSE-VITAMIN C 4-6 GM-MG PO CHEW
CHEWABLE_TABLET | ORAL | Status: AC
  Filled 2022-07-03: qty 1

## 2022-07-03 MED ORDER — POTASSIUM CHLORIDE CRYS ER 20 MEQ PO TBCR
40.0000 meq | EXTENDED_RELEASE_TABLET | Freq: Two times a day (BID) | ORAL | Status: DC
  Administered 2022-07-03 – 2022-07-05 (×6): 40 meq via ORAL
  Filled 2022-07-03 (×7): qty 2

## 2022-07-03 MED ORDER — MAGNESIUM SULFATE 4 GM/100ML IV SOLN
4.0000 g | Freq: Once | INTRAVENOUS | Status: AC
  Administered 2022-07-03: 4 g via INTRAVENOUS
  Filled 2022-07-03: qty 100

## 2022-07-03 MED ORDER — GLUCOSE-VITAMIN C 4-6 GM-MG PO CHEW
CHEWABLE_TABLET | ORAL | Status: AC
  Administered 2022-07-03: 4 g
  Filled 2022-07-03: qty 1

## 2022-07-03 MED ORDER — KCL-LACTATED RINGERS 20 MEQ/L IV SOLN
INTRAVENOUS | Status: DC
  Filled 2022-07-03 (×4): qty 1000

## 2022-07-03 NOTE — Progress Notes (Signed)
Gastroenterology Progress Note   Referring Provider: No ref. provider found Primary Care Physician:  [name], MD Primary Gastroenterologist: [facility]  Patient ID: Jillian Shepherd; [id number]; [birth year]    Subjective   Still having abdominal pain. Mild nausea yesterday and 1 episode of vomiting. So far today no nausea or vomiting. Tolerated clear liquid breakfast tray this morning without issue. + flatus. Requests for husband to be updated on her care.    Objective   Vital signs in last 24 hours Temp:  [97.5 F (36.4 C)-97.8 F (36.6 C)] 97.7 F (36.5 C) (11/28 0733) Pulse Rate:  [54-92] 77 (11/28 0733) Resp:  [9-23] 19 (11/28 0733) BP: (99-145)/(74-102) 138/86 (11/28 0700) SpO2:  [95 %-100 %] 99 % (11/28 0733) Last BM Date : 06/30/22  Physical Exam General:   Alert and oriented, pleasant Head:  Normocephalic and atraumatic. Eyes:  No icterus, sclera clear. Conjuctiva pink.  Abdomen:  Bowel sounds present, soft, non-tender, non-distended. No HSM or hernias noted. No rebound or guarding. No masses appreciated  Extremities:  Without clubbing or edema. Neurologic:  Alert and  oriented x4;  grossly normal neurologically. Skin:  Warm and dry, intact without significant lesions.  Psych:  Alert and cooperative. Normal mood and affect.  Intake/Output from previous day: 11/27 0701 - 11/28 0700 In: 3938.1 [P.O.:120; I.V.:2179.1; IV Piggyback:1639] Out: -  Intake/Output this shift: No intake/output data recorded.  Lab Results  Recent Labs    07/01/22 0609 07/02/22 0338 07/03/22 0429  WBC 9.5 7.9 6.9  HGB 10.7* 9.7* 10.2*  HCT 34.1* 30.6* 32.5*  PLT 415* 423* 389   BMET Recent Labs    07/01/22 0609 07/02/22 0338 07/03/22 0429  NA 136 137 136  K 2.9* 2.9* 3.2*  CL 98 104 104  CO2 '29 28 28  '$ GLUCOSE 69* 85 66*  BUN '11 9 7  '$ CREATININE 0.41* 0.30* 0.30*  CALCIUM 8.1* 8.0* 8.3*   LFT Recent Labs    07/01/22 0609 07/02/22 0338 07/03/22 0429   PROT 6.3* 5.8* 6.0*  ALBUMIN 2.4* 2.2* 2.3*  AST 12* 14* 12*  ALT '6 5 7  '$ ALKPHOS 72 64 65  BILITOT 1.2 0.6 0.6   PT/INR Recent Labs    06/30/22 1754  LABPROT 14.2  INR 1.1   Hepatitis Panel No results for input(s): "HEPBSAG", "HCVAB", "HEPAIGM", "HEPBIGM" in the last 72 hours.  Studies/Results CT ABDOMEN PELVIS W CONTRAST  Result Date: 06/30/2022 CLINICAL DATA:  Abdominal pain, acute, nonlocalized diffuse pain, recent pancreatitis EXAM: CT ABDOMEN AND PELVIS WITH CONTRAST TECHNIQUE: Multidetector CT imaging of the abdomen and pelvis was performed using the standard protocol following bolus administration of intravenous contrast. RADIATION DOSE REDUCTION: This exam was performed according to the departmental dose-optimization program which includes automated exposure control, adjustment of the mA and/or kV according to patient size and/or use of iterative reconstruction technique. CONTRAST:  176m OMNIPAQUE IOHEXOL 300 MG/ML  SOLN COMPARISON:  None Available. FINDINGS: Lower chest: Trace bilateral, left greater than right, pleural effusion. Associated passive atelectasis of left lower lobe. Hepatobiliary: The liver is enlarged measuring up to 19 cm. No focal liver abnormality. No gallstones or definite gallbladder wall thickening. Positive for pericholecystic fluid. No biliary dilatation. Pancreas: No focal lesion. Hazy pancreatic contour with associated peripancreatic fat stranding and free fluid. Slightly decreased in size cystic lesion measuring 2 x 2 cm along the distal pancreatic body. Poorly visualized distal pancreatic tail likely due to cystic changes and edema with underlying nonenhancement of the  pancreatic tail not fully excluded. Interval resolution of cystic lesion along the greater curvature of the stomach. No main pancreatic ductal dilatation. Spleen: Similar-appearing heterogeneous 14 x 8 cm perisplenic subcapsular lesion likely representing a hematoma. Adrenals/Urinary Tract:  No focal lesion. Normal pancreatic contour. No surrounding inflammatory changes. No main pancreatic ductal dilatation. Stomach/Bowel: Stomach is within normal limits. No evidence of bowel wall thickening or dilatation. Appendix appears normal. Vascular/Lymphatic: No splenic artery aneurysm identified. Venous collaterals noted. No abdominal aorta or iliac aneurysm. Mild atherosclerotic plaque of the aorta and its branches. No abdominal, pelvic, or inguinal lymphadenopathy. Reproductive: Uterus and bilateral adnexa are unremarkable. Other: Small volume simple free fluid ascites. No intraperitoneal free gas. No organized fluid collection. Musculoskeletal: No abdominal wall hernia or abnormality. Soft tissue density along the anterior abdominal wall likely due to medication injection. No suspicious lytic or blastic osseous lesions. No acute displaced fracture. Multilevel degenerative changes of the spine. IMPRESSION: 1. Acute pancreatitis with interval decrease in size of a distal pancreatic body cystic lesion likely representing a pseudocyst. Interval resolution of cystic lesion along the greater curvature of the stomach. Component of necrotizing pancreatitis along the pancreatic tail is not fully excluded. 2. Similar-appearing heterogeneous 14 x 8 cm perisplenic subcapsular hematoma. 3. Nonspecific pericholecystic fluid with no definite CT findings acute cholecystitis. 4. Small volume simple free fluid ascites. 5. Trace bilateral, left greater than right, pleural effusion. 6. Mild hepatomegaly. 7. Venous collaterals. Electronically Signed   By: Iven Finn M.D.   On: 06/30/2022 22:45   DG Chest Port 1 View  Result Date: 06/30/2022 CLINICAL DATA:  Recent effusion with decreased lung sounds on the left EXAM: PORTABLE CHEST 1 VIEW COMPARISON:  Radiographs 06/13/2022 FINDINGS: Slight decrease in small left pleural effusion and associated atelectasis since 06/13/2022. Possible trace right pleural effusion. No  pneumothorax. Stable cardiomediastinal silhouette. No acute osseous abnormality. IMPRESSION: Since 06/13/2022, decreased small left pleural effusion and associated atelectasis. Pneumonia is difficult to exclude. Electronically Signed   By: Placido Sou M.D.   On: 06/30/2022 18:26   DG CHEST PORT 1 VIEW  Result Date: 06/13/2022 CLINICAL DATA:  Left pleural effusion vision. EXAM: PORTABLE CHEST 1 VIEW COMPARISON:  Radiograph earlier today. FINDINGS: The previous pigtail catheter in the left hemithorax is no longer seen. Similar size left pleural effusion and basilar opacity from earlier today. No visualized pneumothorax. Overall low lung volumes without focal right lung airspace disease. Stable heart size and mediastinal contours. IMPRESSION: Removal of pigtail catheter from the left hemithorax. Similar size left pleural effusion and basilar opacity from earlier today. No visualized pneumothorax. Electronically Signed   By: Keith Rake M.D.   On: 06/13/2022 15:54   DG Chest Port 1 View  Result Date: 06/13/2022 CLINICAL DATA:  Chest tube EXAM: PORTABLE CHEST 1 VIEW COMPARISON:  Portable exam 0632 hours compared to 06/12/2022 FINDINGS: Pigtail LEFT thoracostomy tube again seen. Enlargement of cardiac silhouette. Mediastinal contours and pulmonary vascularity normal. Persistent LEFT pleural effusion and bibasilar atelectasis. Persistent dense opacity of LEFT lower lobe may represent atelectasis or pneumonia. No pneumothorax or acute osseous findings. IMPRESSION: Bibasilar atelectasis and LEFT pleural effusion with persistent LEFT lower lobe opacity, question atelectasis versus pneumonia. Electronically Signed   By: Lavonia Dana M.D.   On: 06/13/2022 08:47   DG CHEST PORT 1 VIEW  Result Date: 06/12/2022 CLINICAL DATA:  Pleural effusion, chest tube EXAM: PORTABLE CHEST 1 VIEW COMPARISON:  Portable exam 0640 hours compared to 06/11/2022 FINDINGS: Pigtail LEFT thoracostomy tube unchanged.  Enlargement of  cardiac silhouette. Mediastinal contour stable. Small LEFT pleural effusion and basilar atelectasis. No definite acute infiltrate, pneumothorax or acute osseous findings. IMPRESSION: Enlargement of cardiac silhouette. Persistent LEFT pleural effusion and basilar atelectasis despite pigtail thoracostomy tube. Electronically Signed   By: Lavonia Dana M.D.   On: 06/12/2022 10:00   DG CHEST PORT 1 VIEW  Result Date: 06/11/2022 CLINICAL DATA:  Left-sided chest tube. EXAM: PORTABLE CHEST 1 VIEW COMPARISON:  June 10, 2022. FINDINGS: Stable cardiomegaly. Stable left-sided chest tube is noted no definite pneumothorax is noted currently. Left pleural effusion and left basilar atelectasis is again noted. IMPRESSION: Stable position of left-sided chest tube with possible kink within it. No definite pneumothorax is noted. Stable left basilar opacity. Electronically Signed   By: Marijo Conception M.D.   On: 06/11/2022 08:16   DG Chest Port 1 View  Result Date: 06/10/2022 CLINICAL DATA:  Left chest tube placement EXAM: PORTABLE CHEST 1 VIEW COMPARISON:  Previous studies including the examination done earlier today FINDINGS: Transverse diameter of heart is increased. There is significant interval decrease in left pleural effusion after placement of left chest tube. There is a kink in the distal course of the left chest tube approximately 7 cm from the tip. There is possible trace amount of pneumothorax in the lateral aspect of left upper lung field. There is possible tiny left apical pneumothorax. Residual increased density in left lower lung fields suggest possible underlying atelectasis/pneumonia. There is increase in patchy infiltrate in right upper lung fields. IMPRESSION: There is marked interval decrease in left pleural effusion after placement of left chest tube. Residual increased density in left lower lung fields may be due to pleural effusion and underlying atelectasis/pneumonia. A kink is noted in the distal  course of left chest tube. Possible tiny pneumothorax is noted in the left apex and lateral aspect of left upper lung field. There is interval increase in patchy infiltrate in right upper lung field suggesting worsening of pneumonia. Electronically Signed   By: Elmer Picker M.D.   On: 06/10/2022 11:57   DG Abd 1 View  Result Date: 06/10/2022 CLINICAL DATA:  Abdominal pain and distension EXAM: ABDOMEN - 1 VIEW COMPARISON:  None Available. FINDINGS: Scattered large and small bowel gas is noted. No free air is seen. Increasing left-sided effusion is noted when compare with the prior chest exam. No acute bony abnormality is seen. IMPRESSION: Unremarkable abdomen. Increasing left-sided pleural effusion. Electronically Signed   By: Inez Catalina M.D.   On: 06/10/2022 09:56   DG CHEST PORT 1 VIEW  Result Date: 06/10/2022 CLINICAL DATA:  Shortness of breath EXAM: PORTABLE CHEST 1 VIEW COMPARISON:  06/09/2022 FINDINGS: Cardiac shadow is enlarged but stable. Increasing left-sided pleural effusion is noted when compare with the prior exam. Persistent and slightly increased airspace opacity in the right apex is noted. No bony abnormality is noted. IMPRESSION: Increasing left-sided effusion compared with the prior exam. Increasing right apical airspace opacity. Electronically Signed   By: Inez Catalina M.D.   On: 06/10/2022 09:55   DG CHEST PORT 1 VIEW  Result Date: 06/09/2022 CLINICAL DATA:  Post left-sided thoracentesis. EXAM: PORTABLE CHEST 1 VIEW COMPARISON:  CT abdomen and pelvis-06/08/2022; chest radiograph-06/01/2022 FINDINGS: Moderate-sized potentially partially loculated residual left-sided pleural effusion post thoracentesis. No pneumothorax. There is obscuration of the left heart border secondary to left-sided effusion associated left mid and lower lung heterogeneous/consolidative opacities. Trace right-sided pleural effusion is not excluded. Pulmonary vasculature is indistinct with cephalization of  flow. No acute osseous abnormalities. IMPRESSION: 1. Moderate-sized potentially partially loculated residual left-sided effusion post thoracentesis. No pneumothorax. 2. Left mid and lower lung heterogeneous/consolidative opacities, potentially atelectasis though worrisome for multifocal infection. 3. Suspected trace right-sided effusion with suspected mild pulmonary edema. Electronically Signed   By: Sandi Mariscal M.D.   On: 06/09/2022 11:13   US THORACENTESIS ASP PLEURAL SPACE W/IMG GUIDE  Result Date: 06/09/2022 INDICATION: Left pleural effusion, shortness of breath EXAM: ULTRASOUND GUIDED LEFT THORACENTESIS MEDICATIONS: None. COMPLICATIONS: None immediate. PROCEDURE: An ultrasound guided thoracentesis was thoroughly discussed with the patient and questions answered. The benefits, risks, alternatives and complications were also discussed. The patient understands and wishes to proceed with the procedure. Written consent was obtained. Ultrasound was performed to localize and mark an adequate pocket of fluid in the left chest. The area was then prepped and draped in the normal sterile fashion. 1% Lidocaine was used for local anesthesia. Under ultrasound guidance a 6 Fr Safe-T-Centesis catheter was introduced. Thoracentesis was performed. The catheter was removed and a dressing applied. FINDINGS: A total of approximately 500 cc of pleural fluid was removed. Samples were sent to the laboratory as requested by the clinical team. IMPRESSION: Successful ultrasound guided left thoracentesis yielding 500 cc of pleural fluid. Performed and dictated by Pasty Spillers, PA-C Electronically Signed   By: Sandi Mariscal M.D.   On: 06/09/2022 11:09   CT ABDOMEN PELVIS W CONTRAST  Result Date: 06/08/2022 CLINICAL DATA:  Nausea, vomiting, epigastric pain, splenic vein thrombosis, pancreatic necrosis, leukocytosis EXAM: CT ABDOMEN AND PELVIS WITH CONTRAST TECHNIQUE: Multidetector CT imaging of the abdomen and pelvis was performed  using the standard protocol following bolus administration of intravenous contrast. RADIATION DOSE REDUCTION: This exam was performed according to the departmental dose-optimization program which includes automated exposure control, adjustment of the mA and/or kV according to patient size and/or use of iterative reconstruction technique. CONTRAST:  152m OMNIPAQUE IOHEXOL 300 MG/ML  SOLN COMPARISON:  05/31/2022 FINDINGS: Lower chest: New moderate left pleural effusion and consolidation/atelectasis in the visualized dependent aspect of the left lung. Trace right pleural fluid. No pericardial effusion. Hepatobiliary: Gallbladder is physiologically distended without calcified stones. No focal liver lesion or biliary ductal dilatation. Pancreas: Perhaps marginal improvement in the peripancreatic inflammatory change. Peripancreatic pseudocysts with dominant cluster near the body/tail junction are slightly better marginated, similar in size. There is a new well-marginated pseudocyst abutting the lateral wall of gastric body, 4.4 x 2.7 cm. Pancreatic parenchymal enhancement stable. Spleen: Interval development of large mixed-attenuation perisplenic or subcapsular hematoma , margins difficult to differentiate from the spleen itself, the overall process measuring 14 x 10.2 x 9.9 cm (740cc) , compared to original spleen dimensions 11.8 x 4.1 x7.4 cm (187cc) on prior study. No convincing active extravasation on the single phase of the scan. Adrenals/Urinary Tract: No adrenal mass. Symmetric renal enhancement without focal lesion or hydronephrosis. Urinary bladder physiologically distended. Stomach/Bowel: Stomach is incompletely distended, with mucosal enhancement. The small bowel is decompressed. Normal appendix. Colon is incompletely distended, unremarkable. Vascular/Lymphatic: No significant arterial pathology evident. Portal vein patent. Incomplete opacification of the splenic vein consistent with previously described  splenic vein thrombosis. SMV is patent. Bilateral renal veins patent. No abdominal or pelvic adenopathy localized. Reproductive: Uterus and bilateral adnexa are unremarkable. Other: Small volume pelvic ascites. Infiltrative changes in the right upper quadrant and perisplenic mesenteric and retroperitoneal fat may be related to pancreatitis or the splenic hemorrhage. Musculoskeletal: No acute or significant osseous findings. IMPRESSION: 1. Interval development of 500+mL  mixed-attenuation perisplenic/subcapsular splenic hematoma, without evident active extravasation. 2. New 4.4 cm pseudocyst abutting the lateral wall of the gastric body. 3. New moderate left pleural effusion and consolidation/atelectasis in the visualized dependent aspect of the left lung. 4. Some improvement in peripancreatic inflammatory change with stable peripancreatic pseudocysts. 5. Small volume pelvic ascites. 6. Incomplete opacification of the splenic vein consistent with previously described splenic vein thrombosis. Electronically Signed   By: Lucrezia Europe M.D.   On: 06/08/2022 13:32    Assessment  36 y.o. female with a history of alcohol abuse complicated by alcoholic pancreatitis, HTN, Liddle syndrome, and recent hospitalization due to recurrent pancreatitis complicated by splenic vein thrombosis and splenic hematoma, presenting this admission with acute on chronic abdominal pain related to pancreatitis.   Acute on chronic pancreatitis: CT A/P with acute pancreatitis with decrease in distal pancreatic body cystic lesion, felt to represent pseudocyst.  Unable to exclude necrotizing pancreatitis.  Similar perisplenic subscapular hematoma present.  Nonspecific pericholecystic fluid without obvious findings of acute cholecystitis.  Her prior evaluation for pancreatitis with triglycerides 115 in October 2023, normal IgG4 levels.  Clinically feeling about the same today as yesterday.  Has been able to tolerate clear liquids without nausea  vomitings pain is under control.  Discussed with her that given the degree of pancreatitis  History of splenic vein thrombosis: Attempted to reach reading radiologist to discuss presence or absence of splenic vein thrombosis without luck.  Prior admission patient was started on active evaluation and then subsequently developed splenic hematoma.  Currently hematoma stable on most recent CT.  Would likely not recommend restarting anticoagulation given hematoma even if thrombosis remains.  Anemia: Hgb stable at 10.2 today. 13 on admission. Likely somewhat hemodilutional. Anemia also present during prior hospitalization secondary to hemodilution and subsequent splenic hematoma.   Plan / Recommendations  Continue clear liquids today, consider advancing to fulls tomorrow if tolerating.  Continue supportive measures If unable to advance diet to fulls tomorrow, may need to consider nutritional support with NG tube vs TPN. Potassium replacement per primary Pain control and antiemetics per primary team. Continue alcohol cessation     LOS: 3 days    07/03/2022, 10:07 AM   Venetia Night, MSN, FNP-BC, AGACNP-BC [facility]

## 2022-07-03 NOTE — Progress Notes (Signed)
PROGRESS NOTE   Jillian Shepherd  VXB:[id number] DOB: 11/27/1985 DOA: 06/30/2022 PCP: [name], MD   Chief Complaint  Jillian Shepherd presents with   Abdominal Pain   Level of care: Stepdown  Brief Admission History:  36 y.o. female with medical history significant of alcohol abuse, B12 deficiency, hypokalemia, Liddle syndrome, history of splenic hemorrhage with splenic subcapsular hematoma, presents to the ED with a chief complaint of abdominal pain.  Jillian Shepherd was discharged about 2 weeks ago.  She reports that her pain was never improved since discharge.  It was much worse today.  She describes it as a throbbing pain in her epigastric region radiating around to her back, and down midline abdomen.  It is sharp.  Sometimes it feels like squeezing.  At its worst is a 10 out of 10, after 100 mcg of fentanyl and says 7-8 out of 10.  Jillian Shepherd reports she had nausea with nonbloody emesis x4.  Her last normal liquid meal was at noon.  Her last normal bowel movement was this morning.  She reports that the color is bright yellow, but otherwise it was normal bowel movement for her.  It was nonbloody.  Jillian Shepherd reports she has been on a strict full liquid, but as she describes it it sounds more like a soft diet with applesauce, yogurt, Ensure shakes.  Jillian Shepherd was discharged on Levaquin previously she reports she finished that on the 12th.  Jillian Shepherd reports that she has had some pleuritic chest pain on the left.  That is consistent with the size of her pleural effusion is noted.  Jillian Shepherd reports that she has had cough with thick phlegm.  She quit smoking about 1 month ago.  Jillian Shepherd has no other complaints at this time. As stated above Jillian Shepherd does not smoke.  She quit drinking about 1 month ago.  She does not do illicit drugs.  She is vaccinated for COVID.  Jillian Shepherd is full code. In context of Jillian Shepherd's life-she recently lost her job due to missing work for pancreatitis per her report.   Assessment and Plan: *  Acute on chronic pancreatitis - Pt presented with abdominal pain with leukocytosis, thrombocytosis, CT scan showing acute pancreatitis with interval decrease in size of the distal pancreatic body cystic lesion that was likely a pseudocyst.  Component of necrotizing pancreatitis cannot be excluded.  Perisplenic subcapsular hematoma still present-hold anticoagulation - Jillian Shepherd finished Levaquin on November 12 - Broad-spectrum antibiotic coverage with cefepime and Flagyl this visit - Last lipid panel shows triglycerides of 115 - No new medications that could have contributed to this acute flare - Previously thought to be alcoholic pancreatitis, but Jillian Shepherd has not had a drink in 1 month per her report - Pt reports pain not controlled, changed to dilaudid 11/27 with better control.  Continue clear liquid diet. Appreciate GI team recommendations.  Diabetes mellitus type II, controlled ([place]) - Hold metformin - Sliding scale coverage - if BS less than 70 would add dextrose to IV fluid - N.p.o. CBG (last 3)  Recent Labs    07/02/22 0608 07/02/22 2349 07/03/22 0731  GLUCAP 75 79 82     Malnutrition of moderate degree - Hypoalbuminemia at 3.2 - Jillian Shepherd has been on the liquid only diet for a month per her report - only able to tolerate clear liquids at this time.  - obtain nutrition team consult  Acute hypoxic respiratory failure (Jillian Shepherd) - Likely related to pleural effusion - Pneumonia cannot be excluded on chest x-ray but no clinical  symptoms - Jillian Shepherd has recently been on Levaquin and Omnicef plus doxycycline - Continue cefepime - Pain medications could also be contributing to this hypoxia - Wean off as tolerated - Continue to monitor  Hypokalemia - Potassium 2.7, Pt has Liddle's syndrome and on chronic potassium replacement as well as spironolactone.  - continue IV replacement and following closely; replacing low magnesium 11/28, recheck in AM  - Monitor on telemetry  Essential  hypertension - Continue amlodipine  DVT prophylaxis: SCDs Code Status: full  Family Communication:  Disposition: Status is: Inpatient Remains inpatient appropriate because: IV pain management, IV fluid    Consultants:  GI service   Procedures:   Antimicrobials:    Subjective: Pt still having significant pain and poor oral intake only able to tolerate clears.    Objective: Vitals:   07/03/22 0600 07/03/22 0700 07/03/22 0733 07/03/22 1054  BP: 113/74 138/86    Pulse: 80 74 77 76  Resp: '10 19 19 10  '$ Temp:   97.7 F (36.5 C) 98.5 F (36.9 C)  TempSrc:   Oral Oral  SpO2: 96% 100% 99% 96%  Weight:      Height:        Intake/Output Summary (Last 24 hours) at 07/03/2022 1515 Last data filed at 07/03/2022 0600 Gross per 24 hour  Intake 3818.13 ml  Output --  Net 3818.13 ml   Filed Weights   06/30/22 1722 07/02/22 0436  Weight: 65.8 kg 75 kg   Examination:  General exam: Appears calm and comfortable. NAD.  Respiratory system: Clear to auscultation. Respiratory effort normal. Cardiovascular system: normal S1 & S2 heard. No JVD, murmurs, rubs, gallops or clicks. No pedal edema. Gastrointestinal system: Abdomen is nondistended, soft and with mild epigastric tenderness, (+) guarding. No organomegaly or masses felt. Normal bowel sounds heard. Central nervous system: Alert and oriented. No focal neurological deficits. Extremities: Symmetric 5 x 5 power. Skin: No rashes, lesions or ulcers. Psychiatry: Judgement and insight appear poor. Mood & affect appropriate.   Data Reviewed: I have personally reviewed following labs and imaging studies  CBC: Recent Labs  Lab 06/30/22 1754 07/01/22 0609 07/02/22 0338 07/03/22 0429  WBC 13.9* 9.5 7.9 6.9  NEUTROABS  --  6.6 5.3 4.2  HGB 13.0 10.7* 9.7* 10.2*  HCT 39.5 34.1* 30.6* 32.5*  MCV 94.7 98.0 96.2 98.5  PLT 566* 415* 423* 510    Basic Metabolic Panel: Recent Labs  Lab 06/30/22 1754 07/01/22 0609 07/02/22 0338  07/03/22 0429  NA 135 136 137 136  K 2.7* 2.9* 2.9* 3.2*  CL 93* 98 104 104  CO2 '28 29 28 28  '$ GLUCOSE 110* 69* 85 66*  BUN '12 11 9 7  '$ CREATININE 0.50 0.41* 0.30* 0.30*  CALCIUM 9.1 8.1* 8.0* 8.3*  MG  --  2.1  --  1.5*    CBG: Recent Labs  Lab 07/01/22 0326 07/01/22 0746 07/02/22 0608 07/02/22 2349 07/03/22 0731  GLUCAP 77 77 75 79 82    Recent Results (from the past 240 hour(s))  Resp Panel by RT-PCR (Flu A&B, Covid) Anterior Nasal Swab     Status: None   Collection Time: 06/30/22  6:02 PM   Specimen: Anterior Nasal Swab  Result Value Ref Range Status   SARS Coronavirus 2 by RT PCR NEGATIVE NEGATIVE Final    Comment: (NOTE) SARS-CoV-2 target nucleic acids are NOT DETECTED.  The SARS-CoV-2 RNA is generally detectable in upper respiratory specimens during the acute phase of infection. The lowest concentration of  SARS-CoV-2 viral copies this assay can detect is 138 copies/mL. A negative result does not preclude SARS-Cov-2 infection and should not be used as the sole basis for treatment or other Jillian Shepherd management decisions. A negative result may occur with  improper specimen collection/handling, submission of specimen other than nasopharyngeal swab, presence of viral mutation(s) within the areas targeted by this assay, and inadequate number of viral copies(<138 copies/mL). A negative result must be combined with clinical observations, Jillian Shepherd history, and epidemiological information. The expected result is Negative.  Fact Sheet for Patients:  EntrepreneurPulse.com.au  Fact Sheet for Healthcare Providers:  IncredibleEmployment.be  This test is no t yet approved or cleared by the [place] FDA and  has been authorized for detection and/or diagnosis of SARS-CoV-2 by FDA under an Emergency Use Authorization (EUA). This EUA will remain  in effect (meaning this test can be used) for the duration of the COVID-19 declaration under  Section 564(b)(1) of the Act, 21 U.S.C.section 360bbb-3(b)(1), unless the authorization is terminated  or revoked sooner.       Influenza A by PCR NEGATIVE NEGATIVE Final   Influenza B by PCR NEGATIVE NEGATIVE Final    Comment: (NOTE) The Xpert Xpress SARS-CoV-2/FLU/RSV plus assay is intended as an aid in the diagnosis of influenza from Nasopharyngeal swab specimens and should not be used as a sole basis for treatment. Nasal washings and aspirates are unacceptable for Xpert Xpress SARS-CoV-2/FLU/RSV testing.  Fact Sheet for Patients: EntrepreneurPulse.com.au  Fact Sheet for Healthcare Providers: IncredibleEmployment.be  This test is not yet approved or cleared by the [place] FDA and has been authorized for detection and/or diagnosis of SARS-CoV-2 by FDA under an Emergency Use Authorization (EUA). This EUA will remain in effect (meaning this test can be used) for the duration of the COVID-19 declaration under Section 564(b)(1) of the Act, 21 U.S.C. section 360bbb-3(b)(1), unless the authorization is terminated or revoked.  Performed at [facility], [address] [place], [place] [address]   Blood culture (routine x 2)     Status: None (Preliminary result)   Collection Time: 06/30/22  6:13 PM   Specimen: BLOOD LEFT FOREARM  Result Value Ref Range Status   Specimen Description   Final    BLOOD LEFT FOREARM BOTTLES DRAWN AEROBIC AND ANAEROBIC   Special Requests Blood Culture adequate volume  Final   Culture   Final    NO GROWTH 2 DAYS Performed at [facility], [address], [place], [place] [address]    Report Status PENDING  Incomplete  Blood culture (routine x 2)     Status: None (Preliminary result)   Collection Time: 06/30/22  6:29 PM   Specimen: Right Antecubital; Blood  Result Value Ref Range Status   Specimen Description   Final    RIGHT ANTECUBITAL BOTTLES DRAWN AEROBIC AND ANAEROBIC   Special Requests Blood Culture  adequate volume  Final   Culture   Final    NO GROWTH 2 DAYS Performed at [facility], [address], [place], [place] [address]    Report Status PENDING  Incomplete  Urine Culture     Status: None   Collection Time: 06/30/22  9:21 PM   Specimen: Urine, Clean Catch  Result Value Ref Range Status   Specimen Description   Final    URINE, CLEAN CATCH Performed at [facility], [address], [place], [place] [address]    Special Requests   Final    NONE Performed at [facility], [address]  [place], [place] [address]    Culture   Final    NO GROWTH Performed at [facility], [place] [address], [place], [place] [address]    Report Status 07/02/2022 FINAL  Final  MRSA Next Gen by PCR, Nasal     Status: None   Collection Time: 07/01/22  3:09 AM   Specimen: Nasal Mucosa; Nasal Swab  Result Value Ref Range Status   MRSA by PCR Next Gen NOT DETECTED NOT DETECTED Final    Comment: (NOTE) The GeneXpert MRSA Assay (FDA approved for NASAL specimens only), is one component of a comprehensive MRSA colonization surveillance program. It is not intended to diagnose MRSA infection nor to guide or monitor treatment for MRSA infections. Test performance is not FDA approved in patients less than 47 years old. Performed at [facility], [address] [place], [place] [address]      Radiology Studies: No results found.  Scheduled Meds:  amLODipine  10 mg Oral Daily   Chlorhexidine Gluconate Cloth  6 each Topical Daily   folic acid  1 mg Oral Daily   pantoprazole (PROTONIX) IV  40 mg Intravenous Q24H   potassium chloride  40 mEq Oral BID   spironolactone  25 mg Oral BID   Continuous Infusions:  ceFEPime (MAXIPIME) IV 2 g (07/03/22 0938)   lactated ringers 1,000 mL with potassium chloride 20 mEq infusion 125 mL/hr at 07/03/22 1041   metronidazole 100 mL/hr at 07/03/22 0600    LOS: 3 days   Time spent: 35 mins  Gail Creekmore Wynetta Emery, MD How to contact the [facility] Attending or  Consulting provider [place] or covering provider during after hours [place], for this Jillian Shepherd?  Check the care team in [facility] and look for a) attending/consulting [facility] provider listed and b) the [facility] team listed Log into [email or web address] and use [place]'s universal password to access. If you do not have the password, please contact the hospital operator. Locate the [facility] provider you are looking for under [facility] and page to a number that you can be directly reached. If you still have difficulty reaching the provider, please page the [facility] (Director on Call) for the Hospitalists listed on amion for assistance.  07/03/2022, 3:15 PM

## 2022-07-03 NOTE — Progress Notes (Signed)
Pt to transfer to [place] tele room 308.  Report given to [place], RN at 2007.  All belongings  sent with patient.  Left  message for husband Shanon Brow regarding transfer.

## 2022-07-03 NOTE — Progress Notes (Signed)
Pharmacy Antibiotic Note  Jillian Shepherd is a 36 y.o. female admitted on 06/30/2022 with  intraabdominal infection .  Pharmacy has been consulted for cefepime dosing.  Plan: Cefepime 2000 mg IV every 8 hours Consider discontinuation of antibiotics- afebrile, WBC WNL, and negative cultures  Height: '5\' 3"'$  (160 cm) Weight: 75 kg (165 lb 5.5 oz) IBW/kg (Calculated) : 52.4  Temp (24hrs), Avg:97.9 F (36.6 C), Min:97.7 F (36.5 C), Max:98.5 F (36.9 C)  Recent Labs  Lab 06/30/22 1754 06/30/22 2017 07/01/22 0609 07/02/22 0338 07/03/22 0429  WBC 13.9*  --  9.5 7.9 6.9  CREATININE 0.50  --  0.41* 0.30* 0.30*  LATICACIDVEN 1.0 0.9  --   --   --      Estimated Creatinine Clearance: 93.3 mL/min (A) (by C-G formula based on SCr of 0.3 mg/dL (L)).    No Known Allergies  Antimicrobials this admission: Metronidazole 11/25 >> Cefepime 11/25 >>  Microbiology results:  11/25 Bcx: ngtd  Thank you for allowing pharmacy to be a part of this patient's care.  Margot Ables, PharmD Clinical Pharmacist 07/03/2022 5:08 PM

## 2022-07-03 NOTE — Progress Notes (Signed)
Spoke with patient's husband per patient's request.  We discussed the disease process, plan of care, and ongoing monitoring.  We discussed the importance of alcohol cessation.  All questions answered to his satisfaction.  Venetia Night, MSN, APRN, FNP-BC, AGACNP-BC [facility]

## 2022-07-04 DIAGNOSIS — K861 Other chronic pancreatitis: Secondary | ICD-10-CM | POA: Diagnosis not present

## 2022-07-04 DIAGNOSIS — K859 Acute pancreatitis without necrosis or infection, unspecified: Secondary | ICD-10-CM | POA: Diagnosis not present

## 2022-07-04 LAB — GLUCOSE, CAPILLARY
Glucose-Capillary: 67 mg/dL — ABNORMAL LOW (ref 70–99)
Glucose-Capillary: 91 mg/dL (ref 70–99)
Glucose-Capillary: 85 mg/dL (ref 70–99)
Glucose-Capillary: 86 mg/dL (ref 70–99)

## 2022-07-04 LAB — CBC WITH DIFFERENTIAL/PLATELET
Abs Immature Granulocytes: 0.02 10*3/uL (ref 0.00–0.07)
Basophils Absolute: 0 10*3/uL (ref 0.0–0.1)
Basophils Relative: 1 %
Eosinophils Absolute: 0.2 10*3/uL (ref 0.0–0.5)
Eosinophils Relative: 3 %
HCT: 30.4 % — ABNORMAL LOW (ref 36.0–46.0)
Hemoglobin: 9.7 g/dL — ABNORMAL LOW (ref 12.0–15.0)
Immature Granulocytes: 0 %
Lymphocytes Relative: 28 %
Lymphs Abs: 1.7 10*3/uL (ref 0.7–4.0)
MCH: 30.8 pg (ref 26.0–34.0)
MCHC: 31.9 g/dL (ref 30.0–36.0)
MCV: 96.5 fL (ref 80.0–100.0)
Monocytes Absolute: 0.6 10*3/uL (ref 0.1–1.0)
Monocytes Relative: 9 %
Neutro Abs: 3.7 10*3/uL (ref 1.7–7.7)
Neutrophils Relative %: 59 %
Platelets: 362 10*3/uL (ref 150–400)
RBC: 3.15 MIL/uL — ABNORMAL LOW (ref 3.87–5.11)
RDW: 17.5 % — ABNORMAL HIGH (ref 11.5–15.5)
WBC: 6.3 10*3/uL (ref 4.0–10.5)
nRBC: 0 % (ref 0.0–0.2)

## 2022-07-04 LAB — COMPREHENSIVE METABOLIC PANEL
ALT: 6 U/L (ref 0–44)
AST: 12 U/L — ABNORMAL LOW (ref 15–41)
Albumin: 2.1 g/dL — ABNORMAL LOW (ref 3.5–5.0)
Alkaline Phosphatase: 60 U/L (ref 38–126)
Anion gap: 4 — ABNORMAL LOW (ref 5–15)
BUN: <5 mg/dL — ABNORMAL LOW (ref 6–20)
CO2: 28 mmol/L (ref 22–32)
Calcium: 8.3 mg/dL — ABNORMAL LOW (ref 8.9–10.3)
Chloride: 105 mmol/L (ref 98–111)
Creatinine, Ser: 0.32 mg/dL — ABNORMAL LOW (ref 0.44–1.00)
GFR, Estimated: >60 mL/min (ref >60)
Glucose, Bld: 77 mg/dL (ref 70–99)
Potassium: 3.9 mmol/L (ref 3.5–5.1)
Sodium: 137 mmol/L (ref 135–145)
Total Bilirubin: 0.7 mg/dL (ref 0.3–1.2)
Total Protein: 5.9 g/dL — ABNORMAL LOW (ref 6.5–8.1)

## 2022-07-04 LAB — MAGNESIUM: Magnesium: 1.7 mg/dL (ref 1.7–2.4)

## 2022-07-04 LAB — LIPASE, BLOOD: Lipase: 90 U/L — ABNORMAL HIGH (ref 11–51)

## 2022-07-04 NOTE — Progress Notes (Signed)
PROGRESS NOTE    Jillian Shepherd  RKY:[id number] DOB: Dec 12, 1985 DOA: 06/30/2022 PCP: [name], MD   Brief Narrative:    36 y.o. female with medical history significant of alcohol abuse, B12 deficiency, hypokalemia, Liddle syndrome, history of splenic hemorrhage with splenic subcapsular hematoma, presents to the ED with a chief complaint of abdominal pain.  She was readmitted with acute on chronic pancreatitis in the setting of possible alcohol use.  Assessment & Plan:   Principal Problem:   Acute on chronic pancreatitis Active Problems:   Essential hypertension   Hypokalemia   Acute hypoxic respiratory failure (HCC)   Malnutrition of moderate degree   Diabetes mellitus type II, controlled ([place])  Assessment and Plan:    Acute on chronic pancreatitis - Pt presented with abdominal pain with leukocytosis, thrombocytosis, CT scan showing acute pancreatitis with interval decrease in size of the distal pancreatic body cystic lesion that was likely a pseudocyst.  Component of necrotizing pancreatitis cannot be excluded.  Perisplenic subcapsular hematoma still present-hold anticoagulation - Patient finished Levaquin on November 12 - Discontinue cefepime and Flagyl - Last lipid panel shows triglycerides of 115 - No new medications that could have contributed to this acute flare - Previously thought to be alcoholic pancreatitis, but patient has not had a drink in 1 month per her report - Pt reports pain not controlled, changed to dilaudid 11/27 with better control.  Continue clear liquid diet. Appreciate GI team recommendations.   Diabetes mellitus type II, controlled ([place]) - Hold metformin - Sliding scale coverage - if BS less than 70 would add dextrose to IV fluid, continue to monitor - CLD     Malnutrition of moderate degree - Hypoalbuminemia at 3.2 - Patient has been on the liquid only diet for a month per her report - only able to tolerate clear liquids at this time.   - obtain nutrition team consult   Acute hypoxic respiratory failure (HCC) - Likely related to pleural effusion - Pneumonia cannot be excluded on chest x-ray but no clinical symptoms - Patient has recently been on Levaquin and Omnicef plus doxycycline - Discontinue further cefepime - Pain medications could also be contributing to this hypoxia - Wean off as tolerated - Continue to monitor   Hypokalemia - Potassium 2.7, Pt has Liddle's syndrome and on chronic potassium replacement as well as spironolactone.  - continue IV replacement and following closely; replacing low magnesium 11/28, recheck in AM  - Monitor on telemetry   Essential hypertension - Continue amlodipine  Obesity -BMI 31.79   DVT prophylaxis: SCDs Code Status: Full Family Communication: None at bedside Disposition Plan:  Status is: Inpatient Remains inpatient appropriate because: Need for IV medications.  Consultants:  GI  Procedures:  None  Antimicrobials:  Anti-infectives (From admission, onward)    Start     Dose/Rate Route Frequency Ordered Stop   07/01/22 0600  metroNIDAZOLE (FLAGYL) IVPB 500 mg  Status:  Discontinued        500 mg 100 mL/hr over 60 Minutes Intravenous Every 12 hours 07/01/22 0314 07/04/22 1110   06/30/22 1830  ceFEPIme (MAXIPIME) 2 g in sodium chloride 0.9 % 100 mL IVPB  Status:  Discontinued        2 g 200 mL/hr over 30 Minutes Intravenous Every 8 hours 06/30/22 1815 07/04/22 1110   06/30/22 1815  ceFEPIme (MAXIPIME) 2 g in sodium chloride 0.9 % 100 mL IVPB  Status:  Discontinued        2 g 200  mL/hr over 30 Minutes Intravenous  Once 06/30/22 1802 06/30/22 1815   06/30/22 1815  metroNIDAZOLE (FLAGYL) IVPB 500 mg        500 mg 100 mL/hr over 60 Minutes Intravenous  Once 06/30/22 1802 06/30/22 1956       Subjective: Patient seen and evaluated today with continued epigastric abdominal pain that seems worse after eating even on clear liquid.  She denies significant nausea or  vomiting.  Noted to have some mild hypoglycemia this a.m. but was otherwise asymptomatic.  Objective: Vitals:   07/03/22 2123 07/03/22 2150 07/04/22 0142 07/04/22 0531  BP: (!) 127/90  110/79 118/77  Pulse: 83  89 86  Resp: '20  19 20  '$ Temp: 98.5 F (36.9 C)  98.4 F (36.9 C) 98.4 F (36.9 C)  TempSrc: Oral  Oral Oral  SpO2: 97%  (!) 89% 93%  Weight:  81.4 kg    Height:        Intake/Output Summary (Last 24 hours) at 07/04/2022 1111 Last data filed at 07/04/2022 0547 Gross per 24 hour  Intake 1567.5 ml  Output --  Net 1567.5 ml   Filed Weights   06/30/22 1722 07/02/22 0436 07/03/22 2150  Weight: 65.8 kg 75 kg 81.4 kg    Examination:  General exam: Appears calm and comfortable  Respiratory system: Clear to auscultation. Respiratory effort normal. Cardiovascular system: S1 & S2 heard, RRR.  Gastrointestinal system: Abdomen is soft, epigastric tenderness to palpation Central nervous system: Alert and awake Extremities: No edema Skin: No significant lesions noted Psychiatry: Flat affect.    Data Reviewed: I have personally reviewed following labs and imaging studies  CBC: Recent Labs  Lab 06/30/22 1754 07/01/22 0609 07/02/22 0338 07/03/22 0429 07/04/22 0423  WBC 13.9* 9.5 7.9 6.9 6.3  NEUTROABS  --  6.6 5.3 4.2 3.7  HGB 13.0 10.7* 9.7* 10.2* 9.7*  HCT 39.5 34.1* 30.6* 32.5* 30.4*  MCV 94.7 98.0 96.2 98.5 96.5  PLT 566* 415* 423* 389 607   Basic Metabolic Panel: Recent Labs  Lab 06/30/22 1754 07/01/22 0609 07/02/22 0338 07/03/22 0429 07/04/22 0423  NA 135 136 137 136 137  K 2.7* 2.9* 2.9* 3.2* 3.9  CL 93* 98 104 104 105  CO2 '28 29 28 28 28  '$ GLUCOSE 110* 69* 85 66* 77  BUN '12 11 9 7 '$ <5*  CREATININE 0.50 0.41* 0.30* 0.30* 0.32*  CALCIUM 9.1 8.1* 8.0* 8.3* 8.3*  MG  --  2.1  --  1.5* 1.7   GFR: Estimated Creatinine Clearance: 97.3 mL/min (A) (by C-G formula based on SCr of 0.32 mg/dL (L)). Liver Function Tests: Recent Labs  Lab 06/30/22 1754  07/01/22 0609 07/02/22 0338 07/03/22 0429 07/04/22 0423  AST 15 12* 14* 12* 12*  ALT '7 6 5 7 6  '$ ALKPHOS 94 72 64 65 60  BILITOT 1.3* 1.2 0.6 0.6 0.7  PROT 8.0 6.3* 5.8* 6.0* 5.9*  ALBUMIN 3.2* 2.4* 2.2* 2.3* 2.1*   Recent Labs  Lab 06/30/22 1754 07/01/22 0609 07/02/22 0338 07/03/22 0429 07/04/22 0423  LIPASE 108* 130* 64* 80* 90*   No results for input(s): "AMMONIA" in the last 168 hours. Coagulation Profile: Recent Labs  Lab 06/30/22 1754  INR 1.1   Cardiac Enzymes: No results for input(s): "CKTOTAL", "CKMB", "CKMBINDEX", "TROPONINI" in the last 168 hours. BNP (last 3 results) No results for input(s): "PROBNP" in the last 8760 hours. HbA1C: No results for input(s): "HGBA1C" in the last 72 hours. CBG: Recent Labs  Lab  07/03/22 0731 07/03/22 2247 07/04/22 0742 07/04/22 0814 07/04/22 1104  GLUCAP 82 87 67* 91 85   Lipid Profile: No results for input(s): "CHOL", "HDL", "LDLCALC", "TRIG", "CHOLHDL", "LDLDIRECT" in the last 72 hours. Thyroid Function Tests: No results for input(s): "TSH", "T4TOTAL", "FREET4", "T3FREE", "THYROIDAB" in the last 72 hours. Anemia Panel: No results for input(s): "VITAMINB12", "FOLATE", "FERRITIN", "TIBC", "IRON", "RETICCTPCT" in the last 72 hours. Sepsis Labs: Recent Labs  Lab 06/30/22 1754 06/30/22 2017  LATICACIDVEN 1.0 0.9    Recent Results (from the past 240 hour(s))  Resp Panel by RT-PCR (Flu A&B, Covid) Anterior Nasal Swab     Status: None   Collection Time: 06/30/22  6:02 PM   Specimen: Anterior Nasal Swab  Result Value Ref Range Status   SARS Coronavirus 2 by RT PCR NEGATIVE NEGATIVE Final    Comment: (NOTE) SARS-CoV-2 target nucleic acids are NOT DETECTED.  The SARS-CoV-2 RNA is generally detectable in upper respiratory specimens during the acute phase of infection. The lowest concentration of SARS-CoV-2 viral copies this assay can detect is 138 copies/mL. A negative result does not preclude SARS-Cov-2 infection  and should not be used as the sole basis for treatment or other patient management decisions. A negative result may occur with  improper specimen collection/handling, submission of specimen other than nasopharyngeal swab, presence of viral mutation(s) within the areas targeted by this assay, and inadequate number of viral copies(<138 copies/mL). A negative result must be combined with clinical observations, patient history, and epidemiological information. The expected result is Negative.  Fact Sheet for Patients:  EntrepreneurPulse.com.au  Fact Sheet for Healthcare Providers:  IncredibleEmployment.be  This test is no t yet approved or cleared by the [place] FDA and  has been authorized for detection and/or diagnosis of SARS-CoV-2 by FDA under an Emergency Use Authorization (EUA). This EUA will remain  in effect (meaning this test can be used) for the duration of the COVID-19 declaration under Section 564(b)(1) of the Act, 21 U.S.C.section 360bbb-3(b)(1), unless the authorization is terminated  or revoked sooner.       Influenza A by PCR NEGATIVE NEGATIVE Final   Influenza B by PCR NEGATIVE NEGATIVE Final    Comment: (NOTE) The Xpert Xpress SARS-CoV-2/FLU/RSV plus assay is intended as an aid in the diagnosis of influenza from Nasopharyngeal swab specimens and should not be used as a sole basis for treatment. Nasal washings and aspirates are unacceptable for Xpert Xpress SARS-CoV-2/FLU/RSV testing.  Fact Sheet for Patients: EntrepreneurPulse.com.au  Fact Sheet for Healthcare Providers: IncredibleEmployment.be  This test is not yet approved or cleared by the [place] FDA and has been authorized for detection and/or diagnosis of SARS-CoV-2 by FDA under an Emergency Use Authorization (EUA). This EUA will remain in effect (meaning this test can be used) for the duration of the COVID-19 declaration  under Section 564(b)(1) of the Act, 21 U.S.C. section 360bbb-3(b)(1), unless the authorization is terminated or revoked.  Performed at [facility], [address] [place], [place] [address]   Blood culture (routine x 2)     Status: None (Preliminary result)   Collection Time: 06/30/22  6:13 PM   Specimen: BLOOD LEFT FOREARM  Result Value Ref Range Status   Specimen Description   Final    BLOOD LEFT FOREARM BOTTLES DRAWN AEROBIC AND ANAEROBIC   Special Requests Blood Culture adequate volume  Final   Culture   Final    NO GROWTH 2 DAYS Performed at [facility], [address] [place], [place]  [address]    Report Status PENDING  Incomplete  Blood culture (routine x 2)     Status: None (Preliminary result)   Collection Time: 06/30/22  6:29 PM   Specimen: Right Antecubital; Blood  Result Value Ref Range Status   Specimen Description   Final    RIGHT ANTECUBITAL BOTTLES DRAWN AEROBIC AND ANAEROBIC   Special Requests Blood Culture adequate volume  Final   Culture   Final    NO GROWTH 2 DAYS Performed at [facility], [address] [place], [place] [address]    Report Status PENDING  Incomplete  Urine Culture     Status: None   Collection Time: 06/30/22  9:21 PM   Specimen: Urine, Clean Catch  Result Value Ref Range Status   Specimen Description   Final    URINE, CLEAN CATCH Performed at [facility], [address] [place], [place] [address]    Special Requests   Final    NONE Performed at [facility], [address], [place], [place] [address]    Culture   Final    NO GROWTH Performed at [facility], [place] [address] [place], [place] [address]    Report Status 07/02/2022 FINAL  Final  MRSA Next Gen by PCR, Nasal     Status: None   Collection Time: 07/01/22  3:09 AM   Specimen: Nasal Mucosa; Nasal Swab  Result Value Ref Range Status   MRSA by PCR Next Gen NOT DETECTED NOT DETECTED Final    Comment: (NOTE) The GeneXpert MRSA Assay (FDA approved for NASAL  specimens only), is one component of a comprehensive MRSA colonization surveillance program. It is not intended to diagnose MRSA infection nor to guide or monitor treatment for MRSA infections. Test performance is not FDA approved in patients less than 7 years old. Performed at [facility], [address], [place], [place] [address]          Radiology Studies: No results found.      Scheduled Meds:  amLODipine  10 mg Oral Daily   Chlorhexidine Gluconate Cloth  6 each Topical Daily   folic acid  1 mg Oral Daily   pantoprazole (PROTONIX) IV  40 mg Intravenous Q24H   potassium chloride  40 mEq Oral BID   Continuous Infusions:  lactated ringers 1,000 mL with potassium chloride 20 mEq infusion 125 mL/hr at 07/04/22 0908     LOS: 4 days    Time spent: 35 minutes    Landen Knoedler Darleen Crocker, DO [facility]  If 7PM-7AM, please contact night-coverage [email or web address] 07/04/2022, 11:11 AM

## 2022-07-04 NOTE — Progress Notes (Signed)
Patient transferred to unit, awake alert and pleasant. Pain medication given by request for PRN. Patient slept well after med administration, currently resting quietly with call bell in reach.

## 2022-07-04 NOTE — Progress Notes (Signed)
Subjective: Patient reports continued epigastric pain without radiation. She has some nausea without vomiting. She notes that she has tried to consume the liquid diet, however she does not like the taste of the jello and the broth is very salty. She does note that she feels that nausea and pain are both somewhat worse after eating. She has had no BMs today, last was Saturday morning. Noted urge to have a BM this morning but had significant pain in epigastric region when she strained to go. She is passing flatus.   Objective: Vital signs in last 24 hours: Temp:  [97.7 F (36.5 C)-98.5 F (36.9 C)] 98.4 F (36.9 C) (11/29 0531) Pulse Rate:  [74-89] 86 (11/29 0531) Resp:  [10-20] 20 (11/29 0531) BP: (110-134)/(77-91) 118/77 (11/29 0531) SpO2:  [89 %-98 %] 93 % (11/29 0531) Weight:  [81.4 kg] 81.4 kg (11/28 2150) Last BM Date : 07/01/22 General:   Alert and oriented, pleasant Head:  Normocephalic and atraumatic. Eyes:  No icterus, sclera clear. Conjuctiva pink.  Mouth:  Without lesions, mucosa pink and moist.  Heart:  S1, S2 present, no murmurs noted.  Lungs: Clear to auscultation bilaterally, without wheezing, rales, or rhonchi.  Abdomen:  Bowel sounds present, soft, non-distended. TTP of LUQ/Epigastrum and some very mild TTP of RUQ. No HSM or hernias noted. No rebound or guarding. No masses appreciated  Msk:  Symmetrical without gross deformities. Normal posture. Pulses:  Normal pulses noted. Extremities:  Without clubbing or edema. Neurologic:  Alert and  oriented x4;  grossly normal neurologically. Skin:  Warm and dry, intact without significant lesions.  Psych:  Alert and cooperative. Normal mood and affect.  Intake/Output from previous day: 11/28 0701 - 11/29 0700 In: 1567.5 [I.V.:1139.4; IV Piggyback:428.1] Out: -  Intake/Output this shift: No intake/output data recorded.  Lab Results: Recent Labs    07/02/22 0338 07/03/22 0429 07/04/22 0423  WBC 7.9 6.9 6.3  HGB 9.7*  10.2* 9.7*  HCT 30.6* 32.5* 30.4*  PLT 423* 389 362   BMET Recent Labs    07/02/22 0338 07/03/22 0429 07/04/22 0423  NA 137 136 137  K 2.9* 3.2* 3.9  CL 104 104 105  CO2 '28 28 28  '$ GLUCOSE 85 66* 77  BUN 9 7 <5*  CREATININE 0.30* 0.30* 0.32*  CALCIUM 8.0* 8.3* 8.3*   LFT Recent Labs    07/02/22 0338 07/03/22 0429 07/04/22 0423  PROT 5.8* 6.0* 5.9*  ALBUMIN 2.2* 2.3* 2.1*  AST 14* 12* 12*  ALT '5 7 6  '$ ALKPHOS 64 65 60  BILITOT 0.6 0.6 0.7   Assessment: Jillian Shepherd is a 36 y.o. female with a history of alcohol abuse complicated by alcoholic pancreatitis, HTN, Liddle syndrome, and recent hospitalization due to recurrent pancreatitis complicated by splenic vein thrombosis and splenic hematoma, presenting this admission with acute on chronic abdominal pain related to pancreatitis.    Acute on chronic pancreatitis: pancreatitis with distal pancreatic body cystic lesion, likely a pseudocyst. Nonspecific pericholecystic fluid without obvious findings of acute cholecystitis. Her prior evaluation for pancreatitis with triglycerides 115 in October 2023, normal IgG4 levels. Was able to advance diet to clear liquids yesterday but continues to have some worsening of pain and nausea when she consumes liquid meal tray. Will need slow transition to further advancement of diet and May need to consider TPN in the future if she is unable to tolerate diet. She continues to receive IV pain meds around the clock.   History of splenic vein thrombosis/perisplenic hematoma:Previously  visualized splenic thrombosis not visualized on CT imaging this admission, though difficult to tell if this has completely resolved. Perisplenic hematoma appears stable on imaging this admission.   Anemia: hgb 13 on admission, down to 9.7 today. Notably with anemia during last hospitalization as well.  Suspect some aspect of hemodilution/perisplenic hematoma. EGD and TCS done in early 2022 with mild reactive gastropathy  and superficial reactive changes consistent with healing mucosal injury. No overt GI bleeding.   Plan: Continue Clear liquids, consider advancing diet tomorrow  Continue supportive measures Consider TPN in the future if diet cannot be advanced Pain control and anti emetics per hospitalist Continued alcohol cessation     LOS: 4 days    07/04/2022, 9:07 AM   Gahel Safley L. Alver Sorrow, MSN, APRN, AGNP-C Adult-Gerontology Nurse Practitioner [facility] [address]

## 2022-07-04 NOTE — Progress Notes (Signed)
BS 67 this morning. Coke given by tech will recheck in 15 min. MD informed via chat.

## 2022-07-05 ENCOUNTER — Encounter (HOSPITAL_COMMUNITY): Payer: Self-pay | Admitting: Family Medicine

## 2022-07-05 ENCOUNTER — Inpatient Hospital Stay (HOSPITAL_COMMUNITY): Payer: 59

## 2022-07-05 DIAGNOSIS — K859 Acute pancreatitis without necrosis or infection, unspecified: Secondary | ICD-10-CM | POA: Diagnosis not present

## 2022-07-05 DIAGNOSIS — K852 Alcohol induced acute pancreatitis without necrosis or infection: Secondary | ICD-10-CM

## 2022-07-05 DIAGNOSIS — K861 Other chronic pancreatitis: Secondary | ICD-10-CM | POA: Diagnosis not present

## 2022-07-05 LAB — CBC WITH DIFFERENTIAL/PLATELET
Abs Immature Granulocytes: 0.02 10*3/uL (ref 0.00–0.07)
Basophils Absolute: 0 10*3/uL (ref 0.0–0.1)
Basophils Relative: 1 %
Eosinophils Absolute: 0.3 10*3/uL (ref 0.0–0.5)
Eosinophils Relative: 4 %
HCT: 30.9 % — ABNORMAL LOW (ref 36.0–46.0)
Hemoglobin: 9.8 g/dL — ABNORMAL LOW (ref 12.0–15.0)
Immature Granulocytes: 0 %
Lymphocytes Relative: 28 %
Lymphs Abs: 1.9 10*3/uL (ref 0.7–4.0)
MCH: 30.5 pg (ref 26.0–34.0)
MCHC: 31.7 g/dL (ref 30.0–36.0)
MCV: 96.3 fL (ref 80.0–100.0)
Monocytes Absolute: 0.7 10*3/uL (ref 0.1–1.0)
Monocytes Relative: 10 %
Neutro Abs: 4 10*3/uL (ref 1.7–7.7)
Neutrophils Relative %: 57 %
Platelets: 359 10*3/uL (ref 150–400)
RBC: 3.21 MIL/uL — ABNORMAL LOW (ref 3.87–5.11)
RDW: 17.5 % — ABNORMAL HIGH (ref 11.5–15.5)
WBC: 6.9 10*3/uL (ref 4.0–10.5)
nRBC: 0 % (ref 0.0–0.2)

## 2022-07-05 LAB — COMPREHENSIVE METABOLIC PANEL
ALT: 7 U/L (ref 0–44)
AST: 14 U/L — ABNORMAL LOW (ref 15–41)
Albumin: 2.2 g/dL — ABNORMAL LOW (ref 3.5–5.0)
Alkaline Phosphatase: 63 U/L (ref 38–126)
Anion gap: 4 — ABNORMAL LOW (ref 5–15)
BUN: <5 mg/dL — ABNORMAL LOW (ref 6–20)
CO2: 30 mmol/L (ref 22–32)
Calcium: 8.4 mg/dL — ABNORMAL LOW (ref 8.9–10.3)
Chloride: 103 mmol/L (ref 98–111)
Creatinine, Ser: 0.36 mg/dL — ABNORMAL LOW (ref 0.44–1.00)
GFR, Estimated: >60 mL/min (ref >60)
Glucose, Bld: 65 mg/dL — ABNORMAL LOW (ref 70–99)
Potassium: 4.4 mmol/L (ref 3.5–5.1)
Sodium: 137 mmol/L (ref 135–145)
Total Bilirubin: 0.7 mg/dL (ref 0.3–1.2)
Total Protein: 6 g/dL — ABNORMAL LOW (ref 6.5–8.1)

## 2022-07-05 LAB — CULTURE, BLOOD (ROUTINE X 2)
Culture: NO GROWTH
Culture: NO GROWTH
Special Requests: ADEQUATE
Special Requests: ADEQUATE

## 2022-07-05 LAB — GLUCOSE, CAPILLARY
Glucose-Capillary: 66 mg/dL — ABNORMAL LOW (ref 70–99)
Glucose-Capillary: 75 mg/dL (ref 70–99)
Glucose-Capillary: 92 mg/dL (ref 70–99)
Glucose-Capillary: 78 mg/dL (ref 70–99)

## 2022-07-05 LAB — MAGNESIUM: Magnesium: 1.5 mg/dL — ABNORMAL LOW (ref 1.7–2.4)

## 2022-07-05 MED ORDER — MAGNESIUM SULFATE 2 GM/50ML IV SOLN
2.0000 g | Freq: Once | INTRAVENOUS | Status: AC
  Administered 2022-07-05: 2 g via INTRAVENOUS
  Filled 2022-07-05: qty 50

## 2022-07-05 MED ORDER — DOCUSATE SODIUM 100 MG PO CAPS
200.0000 mg | ORAL_CAPSULE | Freq: Every day | ORAL | Status: DC
  Administered 2022-07-05 – 2022-07-06 (×2): 200 mg via ORAL
  Filled 2022-07-05 (×3): qty 2

## 2022-07-05 NOTE — Progress Notes (Signed)
PROGRESS NOTE    Jillian Shepherd  RDE:[id number] DOB: 1985/10/02 DOA: 06/30/2022 PCP: [name], MD   Brief Narrative:    36 y.o. female with medical history significant of alcohol abuse, B12 deficiency, hypokalemia, Liddle syndrome, history of splenic hemorrhage with splenic subcapsular hematoma, presents to the ED with a chief complaint of abdominal pain.  She was readmitted with acute on chronic pancreatitis in the setting of possible alcohol use.   Assessment & Plan:   Principal Problem:   Acute on chronic pancreatitis Active Problems:   Essential hypertension   Hypokalemia   Acute hypoxic respiratory failure (HCC)   Malnutrition of moderate degree   Diabetes mellitus type II, controlled ([place])  Assessment and Plan:   Acute on chronic pancreatitis - Pt presented with abdominal pain with leukocytosis, thrombocytosis, CT scan showing acute pancreatitis with interval decrease in size of the distal pancreatic body cystic lesion that was likely a pseudocyst.  Component of necrotizing pancreatitis cannot be excluded.  Perisplenic subcapsular hematoma still present-hold anticoagulation - Patient finished Levaquin on November 12 - Discontinue cefepime and Flagyl - Last lipid panel shows triglycerides of 115 - No new medications that could have contributed to this acute flare - Previously thought to be alcoholic pancreatitis, but patient has not had a drink in 1 month per her report - Pt reports pain not controlled, changed to dilaudid 11/27 with better control.  Continue clear liquid diet. Appreciate GI team recommendations. -Unfortunately, diet cannot be progressed given her ongoing symptoms, will consider nasojejunal tube feedings as needed   Diabetes mellitus type II, controlled ([place]) - Hold metformin - Sliding scale coverage - if BS less than 70 would add dextrose to IV fluid, continue to monitor - CLD     Malnutrition of moderate degree - Hypoalbuminemia at 3.2 -  Patient has been on the liquid only diet for a month per her report - only able to tolerate clear liquids at this time.  - obtain nutrition team consult   Acute hypoxic respiratory failure-currently resolved - Likely related to pleural effusion - Pneumonia cannot be excluded on chest x-ray but no clinical symptoms - Patient has recently been on Levaquin and Omnicef plus doxycycline - Discontinue further cefepime - Pain medications could also be contributing to this hypoxia - Wean off as tolerated - Continue to monitor   Hypomagnesemia - Replete and reevaluate in a.m.   Essential hypertension - Continue amlodipine   Obesity -BMI 31.79   DVT prophylaxis: SCDs Code Status: Full Family Communication: None at bedside Disposition Plan:  Status is: Inpatient Remains inpatient appropriate because: Need for IV medications.   Consultants:  GI   Procedures:  None   Antimicrobials:  Anti-infectives (From admission, onward)    Start     Dose/Rate Route Frequency Ordered Stop   07/01/22 0600  metroNIDAZOLE (FLAGYL) IVPB 500 mg  Status:  Discontinued        500 mg 100 mL/hr over 60 Minutes Intravenous Every 12 hours 07/01/22 0314 07/04/22 1110   06/30/22 1830  ceFEPIme (MAXIPIME) 2 g in sodium chloride 0.9 % 100 mL IVPB  Status:  Discontinued        2 g 200 mL/hr over 30 Minutes Intravenous Every 8 hours 06/30/22 1815 07/04/22 1110   06/30/22 1815  ceFEPIme (MAXIPIME) 2 g in sodium chloride 0.9 % 100 mL IVPB  Status:  Discontinued        2 g 200 mL/hr over 30 Minutes Intravenous  Once 06/30/22 1802 06/30/22  1815   06/30/22 1815  metroNIDAZOLE (FLAGYL) IVPB 500 mg        500 mg 100 mL/hr over 60 Minutes Intravenous  Once 06/30/22 1802 06/30/22 1956      Subjective: Patient seen and evaluated today with ongoing severe abdominal pain that is intermittent in nature and especially worse with any kind of eating.  She still cannot tolerate any kind of diet.  She denies any  significant nausea or vomiting.  Complains of having poor pain control overnight.  Objective: Vitals:   07/04/22 0142 07/04/22 0531 07/04/22 2113 07/05/22 0451  BP: 110/79 118/77 118/79 (!) 122/92  Pulse: 89 86 80 83  Resp: '19 20 15 12  '$ Temp: 98.4 F (36.9 C) 98.4 F (36.9 C) 98.7 F (37.1 C) 98.7 F (37.1 C)  TempSrc: Oral Oral Oral Oral  SpO2: (!) 89% 93% 93% 93%  Weight:      Height:        Intake/Output Summary (Last 24 hours) at 07/05/2022 1204 Last data filed at 07/05/2022 0900 Gross per 24 hour  Intake 1030.47 ml  Output --  Net 1030.47 ml   Filed Weights   06/30/22 1722 07/02/22 0436 07/03/22 2150  Weight: 65.8 kg 75 kg 81.4 kg    Examination:  General exam: Appears calm and comfortable  Respiratory system: Clear to auscultation. Respiratory effort normal. Cardiovascular system: S1 & S2 heard, RRR.  Gastrointestinal system: Abdomen is soft Central nervous system: Alert and awake Extremities: No edema Skin: No significant lesions noted Psychiatry: Flat affect.    Data Reviewed: I have personally reviewed following labs and imaging studies  CBC: Recent Labs  Lab 07/01/22 0609 07/02/22 0338 07/03/22 0429 07/04/22 0423 07/05/22 0317  WBC 9.5 7.9 6.9 6.3 6.9  NEUTROABS 6.6 5.3 4.2 3.7 4.0  HGB 10.7* 9.7* 10.2* 9.7* 9.8*  HCT 34.1* 30.6* 32.5* 30.4* 30.9*  MCV 98.0 96.2 98.5 96.5 96.3  PLT 415* 423* 389 362 756   Basic Metabolic Panel: Recent Labs  Lab 07/01/22 0609 07/02/22 0338 07/03/22 0429 07/04/22 0423 07/05/22 0317  NA 136 137 136 137 137  K 2.9* 2.9* 3.2* 3.9 4.4  CL 98 104 104 105 103  CO2 '29 28 28 28 30  '$ GLUCOSE 69* 85 66* 77 65*  BUN '11 9 7 '$ <5* <5*  CREATININE 0.41* 0.30* 0.30* 0.32* 0.36*  CALCIUM 8.1* 8.0* 8.3* 8.3* 8.4*  MG 2.1  --  1.5* 1.7 1.5*   GFR: Estimated Creatinine Clearance: 97.3 mL/min (A) (by C-G formula based on SCr of 0.36 mg/dL (L)). Liver Function Tests: Recent Labs  Lab 07/01/22 0609 07/02/22 0338  07/03/22 0429 07/04/22 0423 07/05/22 0317  AST 12* 14* 12* 12* 14*  ALT '6 5 7 6 7  '$ ALKPHOS 72 64 65 60 63  BILITOT 1.2 0.6 0.6 0.7 0.7  PROT 6.3* 5.8* 6.0* 5.9* 6.0*  ALBUMIN 2.4* 2.2* 2.3* 2.1* 2.2*   Recent Labs  Lab 06/30/22 1754 07/01/22 0609 07/02/22 0338 07/03/22 0429 07/04/22 0423  LIPASE 108* 130* 64* 80* 90*   No results for input(s): "AMMONIA" in the last 168 hours. Coagulation Profile: Recent Labs  Lab 06/30/22 1754  INR 1.1   Cardiac Enzymes: No results for input(s): "CKTOTAL", "CKMB", "CKMBINDEX", "TROPONINI" in the last 168 hours. BNP (last 3 results) No results for input(s): "PROBNP" in the last 8760 hours. HbA1C: No results for input(s): "HGBA1C" in the last 72 hours. CBG: Recent Labs  Lab 07/04/22 1104 07/04/22 1640 07/05/22 0059 07/05/22 0720  07/05/22 1125  GLUCAP 85 86 66* 75 92   Lipid Profile: No results for input(s): "CHOL", "HDL", "LDLCALC", "TRIG", "CHOLHDL", "LDLDIRECT" in the last 72 hours. Thyroid Function Tests: No results for input(s): "TSH", "T4TOTAL", "FREET4", "T3FREE", "THYROIDAB" in the last 72 hours. Anemia Panel: No results for input(s): "VITAMINB12", "FOLATE", "FERRITIN", "TIBC", "IRON", "RETICCTPCT" in the last 72 hours. Sepsis Labs: Recent Labs  Lab 06/30/22 1754 06/30/22 2017  LATICACIDVEN 1.0 0.9    Recent Results (from the past 240 hour(s))  Resp Panel by RT-PCR (Flu A&B, Covid) Anterior Nasal Swab     Status: None   Collection Time: 06/30/22  6:02 PM   Specimen: Anterior Nasal Swab  Result Value Ref Range Status   SARS Coronavirus 2 by RT PCR NEGATIVE NEGATIVE Final    Comment: (NOTE) SARS-CoV-2 target nucleic acids are NOT DETECTED.  The SARS-CoV-2 RNA is generally detectable in upper respiratory specimens during the acute phase of infection. The lowest concentration of SARS-CoV-2 viral copies this assay can detect is 138 copies/mL. A negative result does not preclude SARS-Cov-2 infection and should not  be used as the sole basis for treatment or other patient management decisions. A negative result may occur with  improper specimen collection/handling, submission of specimen other than nasopharyngeal swab, presence of viral mutation(s) within the areas targeted by this assay, and inadequate number of viral copies(<138 copies/mL). A negative result must be combined with clinical observations, patient history, and epidemiological information. The expected result is Negative.  Fact Sheet for Patients:  EntrepreneurPulse.com.au  Fact Sheet for Healthcare Providers:  IncredibleEmployment.be  This test is no t yet approved or cleared by the [place] FDA and  has been authorized for detection and/or diagnosis of SARS-CoV-2 by FDA under an Emergency Use Authorization (EUA). This EUA will remain  in effect (meaning this test can be used) for the duration of the COVID-19 declaration under Section 564(b)(1) of the Act, 21 U.S.C.section 360bbb-3(b)(1), unless the authorization is terminated  or revoked sooner.       Influenza A by PCR NEGATIVE NEGATIVE Final   Influenza B by PCR NEGATIVE NEGATIVE Final    Comment: (NOTE) The Xpert Xpress SARS-CoV-2/FLU/RSV plus assay is intended as an aid in the diagnosis of influenza from Nasopharyngeal swab specimens and should not be used as a sole basis for treatment. Nasal washings and aspirates are unacceptable for Xpert Xpress SARS-CoV-2/FLU/RSV testing.  Fact Sheet for Patients: EntrepreneurPulse.com.au  Fact Sheet for Healthcare Providers: IncredibleEmployment.be  This test is not yet approved or cleared by the [place] FDA and has been authorized for detection and/or diagnosis of SARS-CoV-2 by FDA under an Emergency Use Authorization (EUA). This EUA will remain in effect (meaning this test can be used) for the duration of the COVID-19 declaration under Section  564(b)(1) of the Act, 21 U.S.C. section 360bbb-3(b)(1), unless the authorization is terminated or revoked.  Performed at [facility], [address], [place], [place] [address]   Blood culture (routine x 2)     Status: None (Preliminary result)   Collection Time: 06/30/22  6:13 PM   Specimen: BLOOD LEFT FOREARM  Result Value Ref Range Status   Specimen Description   Final    BLOOD LEFT FOREARM BOTTLES DRAWN AEROBIC AND ANAEROBIC   Special Requests Blood Culture adequate volume  Final   Culture   Final    NO GROWTH 4 DAYS Performed at [facility], [address] [place], [place] [address]    Report Status PENDING  Incomplete  Blood culture (routine x 2)     Status: None (Preliminary result)   Collection Time: 06/30/22  6:29 PM   Specimen: Right Antecubital; Blood  Result Value Ref Range Status   Specimen Description   Final    RIGHT ANTECUBITAL BOTTLES DRAWN AEROBIC AND ANAEROBIC   Special Requests Blood Culture adequate volume  Final   Culture   Final    NO GROWTH 4 DAYS Performed at [facility], [address], [place], [place] [address]    Report Status PENDING  Incomplete  Urine Culture     Status: None   Collection Time: 06/30/22  9:21 PM   Specimen: Urine, Clean Catch  Result Value Ref Range Status   Specimen Description   Final    URINE, CLEAN CATCH Performed at [facility], [address], [place], [place] [address]    Special Requests   Final    NONE Performed at [facility], [address], [place], [place] [address]    Culture   Final    NO GROWTH Performed at [facility], [place] [address] [place], [place] [address]    Report Status 07/02/2022 FINAL  Final  MRSA Next Gen by PCR, Nasal     Status: None   Collection Time: 07/01/22  3:09 AM   Specimen: Nasal Mucosa; Nasal Swab  Result Value Ref Range Status   MRSA by PCR Next Gen NOT DETECTED NOT DETECTED Final    Comment: (NOTE) The GeneXpert MRSA Assay (FDA approved for NASAL specimens only), is  one component of a comprehensive MRSA colonization surveillance program. It is not intended to diagnose MRSA infection nor to guide or monitor treatment for MRSA infections. Test performance is not FDA approved in patients less than 13 years old. Performed at [facility], [address], [place], [place] [address]          Radiology Studies: No results found.      Scheduled Meds:  amLODipine  10 mg Oral Daily   Chlorhexidine Gluconate Cloth  6 each Topical Daily   docusate sodium  200 mg Oral Daily   folic acid  1 mg Oral Daily   pantoprazole (PROTONIX) IV  40 mg Intravenous Q24H   potassium chloride  40 mEq Oral BID   Continuous Infusions:  lactated ringers 1,000 mL with potassium chloride 20 mEq infusion 125 mL/hr at 07/05/22 0030     LOS: 5 days    Time spent: 35 minutes    Mandeep Ferch Darleen Crocker, DO [facility]  If 7PM-7AM, please contact night-coverage [email or web address] 07/05/2022, 12:04 PM

## 2022-07-05 NOTE — Progress Notes (Addendum)
Gastroenterology Progress Note   Referring Provider: No ref. provider found Primary Care Physician:  [name], MD Primary Gastroenterologist:  [facility]  Patient ID: Jillian Shepherd; [id number]; 16-Dec-1985   Subjective:    Pain persists in epigastric region. Was able to take in jello, mango ice. Cannot tolerate broth. Drinking water ok. Still using pain medication every 2-3 hours. If she does not take it she gets severe twisting/stabbing epigastric pain. She is concerned about her nutritional status. She does not feel she is getting enough nutrition and she is inquiring about feeding tube. She reports 60 pound weight loss since onset of pancreatitis last month. She is passing flatus. Last BM Saturday. Has rectum pressure but afraid to push due to her abdominal pain. Did not tolerate Miralax before due to nausea.   Objective:   Vital signs in last 24 hours: Temp:  [98.7 F (37.1 C)] 98.7 F (37.1 C) (11/30 0451) Pulse Rate:  [80-83] 83 (11/30 0451) Resp:  [12-15] 12 (11/30 0451) BP: (118-122)/(79-92) 122/92 (11/30 0451) SpO2:  [93 %] 93 % (11/30 0451) Last BM Date : 07/01/22 General:   Alert,  Well-developed, well-nourished, pleasant and cooperative in NAD Head:  Normocephalic and atraumatic. Eyes:  Sclera clear, no icterus.  Abdomen:  Soft,  nondistended.  Normal bowel sounds, without guarding, and without rebound.  Moderate epigastric tenderness Extremities:  Without clubbing, deformity or edema. Neurologic:  Alert and  oriented x4;  grossly normal neurologically. Skin:  Intact without significant lesions or rashes. Psych:  Alert and cooperative. Normal mood and affect.  Intake/Output from previous day: 11/29 0701 - 11/30 0700 In: 670.5 [I.V.:670.5] Out: -  Intake/Output this shift: No intake/output data recorded.  Lab Results: CBC Recent Labs    07/03/22 0429 07/04/22 0423 07/05/22 0317  WBC 6.9 6.3 6.9  HGB 10.2* 9.7* 9.8*  HCT 32.5* 30.4* 30.9*  MCV  98.5 96.5 96.3  PLT 389 362 359   BMET Recent Labs    07/03/22 0429 07/04/22 0423 07/05/22 0317  NA 136 137 137  K 3.2* 3.9 4.4  CL 104 105 103  CO2 '28 28 30  '$ GLUCOSE 66* 77 65*  BUN 7 <5* <5*  CREATININE 0.30* 0.32* 0.36*  CALCIUM 8.3* 8.3* 8.4*   LFTs Recent Labs    07/03/22 0429 07/04/22 0423 07/05/22 0317  BILITOT 0.6 0.7 0.7  ALKPHOS 65 60 63  AST 12* 12* 14*  ALT '7 6 7  '$ PROT 6.0* 5.9* 6.0*  ALBUMIN 2.3* 2.1* 2.2*   Recent Labs    07/03/22 0429 07/04/22 0423  LIPASE 80* 90*   PT/INR No results for input(s): "LABPROT", "INR" in the last 72 hours.       Imaging Studies: CT ABDOMEN PELVIS W CONTRAST  Result Date: 06/30/2022 CLINICAL DATA:  Abdominal pain, acute, nonlocalized diffuse pain, recent pancreatitis EXAM: CT ABDOMEN AND PELVIS WITH CONTRAST TECHNIQUE: Multidetector CT imaging of the abdomen and pelvis was performed using the standard protocol following bolus administration of intravenous contrast. RADIATION DOSE REDUCTION: This exam was performed according to the departmental dose-optimization program which includes automated exposure control, adjustment of the mA and/or kV according to patient size and/or use of iterative reconstruction technique. CONTRAST:  132m OMNIPAQUE IOHEXOL 300 MG/ML  SOLN COMPARISON:  None Available. FINDINGS: Lower chest: Trace bilateral, left greater than right, pleural effusion. Associated passive atelectasis of left lower lobe. Hepatobiliary: The liver is enlarged measuring up to 19 cm. No focal liver abnormality. No gallstones or definite gallbladder  wall thickening. Positive for pericholecystic fluid. No biliary dilatation. Pancreas: No focal lesion. Hazy pancreatic contour with associated peripancreatic fat stranding and free fluid. Slightly decreased in size cystic lesion measuring 2 x 2 cm along the distal pancreatic body. Poorly visualized distal pancreatic tail likely due to cystic changes and edema with underlying  nonenhancement of the pancreatic tail not fully excluded. Interval resolution of cystic lesion along the greater curvature of the stomach. No main pancreatic ductal dilatation. Spleen: Similar-appearing heterogeneous 14 x 8 cm perisplenic subcapsular lesion likely representing a hematoma. Adrenals/Urinary Tract: No focal lesion. Normal pancreatic contour. No surrounding inflammatory changes. No main pancreatic ductal dilatation. Stomach/Bowel: Stomach is within normal limits. No evidence of bowel wall thickening or dilatation. Appendix appears normal. Vascular/Lymphatic: No splenic artery aneurysm identified. Venous collaterals noted. No abdominal aorta or iliac aneurysm. Mild atherosclerotic plaque of the aorta and its branches. No abdominal, pelvic, or inguinal lymphadenopathy. Reproductive: Uterus and bilateral adnexa are unremarkable. Other: Small volume simple free fluid ascites. No intraperitoneal free gas. No organized fluid collection. Musculoskeletal: No abdominal wall hernia or abnormality. Soft tissue density along the anterior abdominal wall likely due to medication injection. No suspicious lytic or blastic osseous lesions. No acute displaced fracture. Multilevel degenerative changes of the spine. IMPRESSION: 1. Acute pancreatitis with interval decrease in size of a distal pancreatic body cystic lesion likely representing a pseudocyst. Interval resolution of cystic lesion along the greater curvature of the stomach. Component of necrotizing pancreatitis along the pancreatic tail is not fully excluded. 2. Similar-appearing heterogeneous 14 x 8 cm perisplenic subcapsular hematoma. 3. Nonspecific pericholecystic fluid with no definite CT findings acute cholecystitis. 4. Small volume simple free fluid ascites. 5. Trace bilateral, left greater than right, pleural effusion. 6. Mild hepatomegaly. 7. Venous collaterals. Electronically Signed   By: Iven Finn M.D.   On: 06/30/2022 22:45   DG Chest Port 1  View  Result Date: 06/30/2022 CLINICAL DATA:  Recent effusion with decreased lung sounds on the left EXAM: PORTABLE CHEST 1 VIEW COMPARISON:  Radiographs 06/13/2022 FINDINGS: Slight decrease in small left pleural effusion and associated atelectasis since 06/13/2022. Possible trace right pleural effusion. No pneumothorax. Stable cardiomediastinal silhouette. No acute osseous abnormality. IMPRESSION: Since 06/13/2022, decreased small left pleural effusion and associated atelectasis. Pneumonia is difficult to exclude. Electronically Signed   By: Placido Sou M.D.   On: 06/30/2022 18:26   DG CHEST PORT 1 VIEW  Result Date: 06/13/2022 CLINICAL DATA:  Left pleural effusion vision. EXAM: PORTABLE CHEST 1 VIEW COMPARISON:  Radiograph earlier today. FINDINGS: The previous pigtail catheter in the left hemithorax is no longer seen. Similar size left pleural effusion and basilar opacity from earlier today. No visualized pneumothorax. Overall low lung volumes without focal right lung airspace disease. Stable heart size and mediastinal contours. IMPRESSION: Removal of pigtail catheter from the left hemithorax. Similar size left pleural effusion and basilar opacity from earlier today. No visualized pneumothorax. Electronically Signed   By: Keith Rake M.D.   On: 06/13/2022 15:54   DG Chest Port 1 View  Result Date: 06/13/2022 CLINICAL DATA:  Chest tube EXAM: PORTABLE CHEST 1 VIEW COMPARISON:  Portable exam 0632 hours compared to 06/12/2022 FINDINGS: Pigtail LEFT thoracostomy tube again seen. Enlargement of cardiac silhouette. Mediastinal contours and pulmonary vascularity normal. Persistent LEFT pleural effusion and bibasilar atelectasis. Persistent dense opacity of LEFT lower lobe may represent atelectasis or pneumonia. No pneumothorax or acute osseous findings. IMPRESSION: Bibasilar atelectasis and LEFT pleural effusion with persistent LEFT lower lobe  opacity, question atelectasis versus pneumonia.  Electronically Signed   By: Lavonia Dana M.D.   On: 06/13/2022 08:47   DG CHEST PORT 1 VIEW  Result Date: 06/12/2022 CLINICAL DATA:  Pleural effusion, chest tube EXAM: PORTABLE CHEST 1 VIEW COMPARISON:  Portable exam 0640 hours compared to 06/11/2022 FINDINGS: Pigtail LEFT thoracostomy tube unchanged. Enlargement of cardiac silhouette. Mediastinal contour stable. Small LEFT pleural effusion and basilar atelectasis. No definite acute infiltrate, pneumothorax or acute osseous findings. IMPRESSION: Enlargement of cardiac silhouette. Persistent LEFT pleural effusion and basilar atelectasis despite pigtail thoracostomy tube. Electronically Signed   By: Lavonia Dana M.D.   On: 06/12/2022 10:00   DG CHEST PORT 1 VIEW  Result Date: 06/11/2022 CLINICAL DATA:  Left-sided chest tube. EXAM: PORTABLE CHEST 1 VIEW COMPARISON:  June 10, 2022. FINDINGS: Stable cardiomegaly. Stable left-sided chest tube is noted no definite pneumothorax is noted currently. Left pleural effusion and left basilar atelectasis is again noted. IMPRESSION: Stable position of left-sided chest tube with possible kink within it. No definite pneumothorax is noted. Stable left basilar opacity. Electronically Signed   By: Marijo Conception M.D.   On: 06/11/2022 08:16   DG Chest Port 1 View  Result Date: 06/10/2022 CLINICAL DATA:  Left chest tube placement EXAM: PORTABLE CHEST 1 VIEW COMPARISON:  Previous studies including the examination done earlier today FINDINGS: Transverse diameter of heart is increased. There is significant interval decrease in left pleural effusion after placement of left chest tube. There is a kink in the distal course of the left chest tube approximately 7 cm from the tip. There is possible trace amount of pneumothorax in the lateral aspect of left upper lung field. There is possible tiny left apical pneumothorax. Residual increased density in left lower lung fields suggest possible underlying atelectasis/pneumonia. There is  increase in patchy infiltrate in right upper lung fields. IMPRESSION: There is marked interval decrease in left pleural effusion after placement of left chest tube. Residual increased density in left lower lung fields may be due to pleural effusion and underlying atelectasis/pneumonia. A kink is noted in the distal course of left chest tube. Possible tiny pneumothorax is noted in the left apex and lateral aspect of left upper lung field. There is interval increase in patchy infiltrate in right upper lung field suggesting worsening of pneumonia. Electronically Signed   By: Elmer Picker M.D.   On: 06/10/2022 11:57   DG Abd 1 View  Result Date: 06/10/2022 CLINICAL DATA:  Abdominal pain and distension EXAM: ABDOMEN - 1 VIEW COMPARISON:  None Available. FINDINGS: Scattered large and small bowel gas is noted. No free air is seen. Increasing left-sided effusion is noted when compare with the prior chest exam. No acute bony abnormality is seen. IMPRESSION: Unremarkable abdomen. Increasing left-sided pleural effusion. Electronically Signed   By: Inez Catalina M.D.   On: 06/10/2022 09:56   DG CHEST PORT 1 VIEW  Result Date: 06/10/2022 CLINICAL DATA:  Shortness of breath EXAM: PORTABLE CHEST 1 VIEW COMPARISON:  06/09/2022 FINDINGS: Cardiac shadow is enlarged but stable. Increasing left-sided pleural effusion is noted when compare with the prior exam. Persistent and slightly increased airspace opacity in the right apex is noted. No bony abnormality is noted. IMPRESSION: Increasing left-sided effusion compared with the prior exam. Increasing right apical airspace opacity. Electronically Signed   By: Inez Catalina M.D.   On: 06/10/2022 09:55   DG CHEST PORT 1 VIEW  Result Date: 06/09/2022 CLINICAL DATA:  Post left-sided thoracentesis. EXAM: PORTABLE CHEST  1 VIEW COMPARISON:  CT abdomen and pelvis-06/08/2022; chest radiograph-06/01/2022 FINDINGS: Moderate-sized potentially partially loculated residual left-sided  pleural effusion post thoracentesis. No pneumothorax. There is obscuration of the left heart border secondary to left-sided effusion associated left mid and lower lung heterogeneous/consolidative opacities. Trace right-sided pleural effusion is not excluded. Pulmonary vasculature is indistinct with cephalization of flow. No acute osseous abnormalities. IMPRESSION: 1. Moderate-sized potentially partially loculated residual left-sided effusion post thoracentesis. No pneumothorax. 2. Left mid and lower lung heterogeneous/consolidative opacities, potentially atelectasis though worrisome for multifocal infection. 3. Suspected trace right-sided effusion with suspected mild pulmonary edema. Electronically Signed   By: Sandi Mariscal M.D.   On: 06/09/2022 11:13   US THORACENTESIS ASP PLEURAL SPACE W/IMG GUIDE  Result Date: 06/09/2022 INDICATION: Left pleural effusion, shortness of breath EXAM: ULTRASOUND GUIDED LEFT THORACENTESIS MEDICATIONS: None. COMPLICATIONS: None immediate. PROCEDURE: An ultrasound guided thoracentesis was thoroughly discussed with the patient and questions answered. The benefits, risks, alternatives and complications were also discussed. The patient understands and wishes to proceed with the procedure. Written consent was obtained. Ultrasound was performed to localize and mark an adequate pocket of fluid in the left chest. The area was then prepped and draped in the normal sterile fashion. 1% Lidocaine was used for local anesthesia. Under ultrasound guidance a 6 Fr Safe-T-Centesis catheter was introduced. Thoracentesis was performed. The catheter was removed and a dressing applied. FINDINGS: A total of approximately 500 cc of pleural fluid was removed. Samples were sent to the laboratory as requested by the clinical team. IMPRESSION: Successful ultrasound guided left thoracentesis yielding 500 cc of pleural fluid. Performed and dictated by Pasty Spillers, PA-C Electronically Signed   By: Sandi Mariscal  M.D.   On: 06/09/2022 11:09   CT ABDOMEN PELVIS W CONTRAST  Result Date: 06/08/2022 CLINICAL DATA:  Nausea, vomiting, epigastric pain, splenic vein thrombosis, pancreatic necrosis, leukocytosis EXAM: CT ABDOMEN AND PELVIS WITH CONTRAST TECHNIQUE: Multidetector CT imaging of the abdomen and pelvis was performed using the standard protocol following bolus administration of intravenous contrast. RADIATION DOSE REDUCTION: This exam was performed according to the departmental dose-optimization program which includes automated exposure control, adjustment of the mA and/or kV according to patient size and/or use of iterative reconstruction technique. CONTRAST:  153m OMNIPAQUE IOHEXOL 300 MG/ML  SOLN COMPARISON:  05/31/2022 FINDINGS: Lower chest: New moderate left pleural effusion and consolidation/atelectasis in the visualized dependent aspect of the left lung. Trace right pleural fluid. No pericardial effusion. Hepatobiliary: Gallbladder is physiologically distended without calcified stones. No focal liver lesion or biliary ductal dilatation. Pancreas: Perhaps marginal improvement in the peripancreatic inflammatory change. Peripancreatic pseudocysts with dominant cluster near the body/tail junction are slightly better marginated, similar in size. There is a new well-marginated pseudocyst abutting the lateral wall of gastric body, 4.4 x 2.7 cm. Pancreatic parenchymal enhancement stable. Spleen: Interval development of large mixed-attenuation perisplenic or subcapsular hematoma , margins difficult to differentiate from the spleen itself, the overall process measuring 14 x 10.2 x 9.9 cm (740cc) , compared to original spleen dimensions 11.8 x 4.1 x7.4 cm (187cc) on prior study. No convincing active extravasation on the single phase of the scan. Adrenals/Urinary Tract: No adrenal mass. Symmetric renal enhancement without focal lesion or hydronephrosis. Urinary bladder physiologically distended. Stomach/Bowel: Stomach is  incompletely distended, with mucosal enhancement. The small bowel is decompressed. Normal appendix. Colon is incompletely distended, unremarkable. Vascular/Lymphatic: No significant arterial pathology evident. Portal vein patent. Incomplete opacification of the splenic vein consistent with previously described splenic vein thrombosis. SMV is  patent. Bilateral renal veins patent. No abdominal or pelvic adenopathy localized. Reproductive: Uterus and bilateral adnexa are unremarkable. Other: Small volume pelvic ascites. Infiltrative changes in the right upper quadrant and perisplenic mesenteric and retroperitoneal fat may be related to pancreatitis or the splenic hemorrhage. Musculoskeletal: No acute or significant osseous findings. IMPRESSION: 1. Interval development of 500+mL mixed-attenuation perisplenic/subcapsular splenic hematoma, without evident active extravasation. 2. New 4.4 cm pseudocyst abutting the lateral wall of the gastric body. 3. New moderate left pleural effusion and consolidation/atelectasis in the visualized dependent aspect of the left lung. 4. Some improvement in peripancreatic inflammatory change with stable peripancreatic pseudocysts. 5. Small volume pelvic ascites. 6. Incomplete opacification of the splenic vein consistent with previously described splenic vein thrombosis. Electronically Signed   By: Lucrezia Europe M.D.   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     Acute on chronic alcoholic pancreatitis: pancreatitis with distal pancreatic body cystic lesion, likely pseudocyst. Previously complicated by splenic vein thrombosis/perisplenic hematoma. Difficult to tell if  thrombosis has completely resolved on recent CT. Perisplenic hematoma appears stable this admission. Cannot exclude component of necrotizing pancreatitis along pancreatic tail on most recent CT 06/30/22.   Continues to require frequent pain medication. She has been unable to advance her diet and remains on clear liquids. She consumed jello and mango ice today. She is concerned about nutritional status. Albumin 2.2.   Nasojejunal tube feeding is preferred to TPN. Using elemental or semi-elemental formula.   Anemia: Hgb 9.8 today. At time of recent discharge Hgb was 10.7. No overt GI bleeding. EGD and TCS done in early 2022 with mild reactive gastropathy and superficial reactive changes consistent with healing mucosal injury.   Plan:   To discuss possible nasojejunal tube feedings with team. Patient requesting other means for nutrition due to significant weight loss, malnutrition, and being unable to advance diet beyond clears (with minimal oral intake).  Continue IV PPI.  Docusate sodium '200mg'$ .    LOS: 5 days   Laureen Ochs. Bernarda Caffey [facility] [phone] 11/30/20237:51 AM   Attending note:   Enteral support is recommended now - via  [place] or NG tube.

## 2022-07-05 NOTE — Progress Notes (Addendum)
Initial Nutrition Assessment  DOCUMENTATION CODES:   Non-severe (moderate) malnutrition in context of chronic illness  INTERVENTION:  When NJ tube is placed and verified: Continuous Vital 1.5 @ 30 ml/hr via NJ tube increase 10 ml/hr q 6 hrs to goal 50 ml/hr  ProSource TF20 (60 ml) daily per tube  Provides:1880 kcal, 101 gr protein and 917 ml water.   Multivitamin 5 ml daily per tube  NUTRITION DIAGNOSIS:   Moderate Malnutrition related to acute illness, chronic illness (acute on chronic pancreatitis) as evidenced by percent weight loss (8% x 1 month, 12.6% ~3 months and 17% x 5 months), mild muscle depletion, liquid diet 1 month PTA. -ongoing  GOAL:  Patient will meet greater than or equal to 90% of their needs - not met   MONITOR:  Diet advancement, Labs, Weight trends  REASON FOR ASSESSMENT:  Enteral feeding management     ASSESSMENT: Patient is a 36 yo female with hx of DM2, ETOH use, Liddle syndrome, B-12 deficiency, hypokalemia and moderate malnutrition. Presents with acute on chronic pancreatitis.   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 She has been on liquid diet since 05/27/22. Primarily consuming Ensure, apple and grape juice and likes Mango Ice. Expressed feeling apprehensive about resuming oral intake.   Patient receiving clear liquids and per chart has been on liquid diet the past month.  GI following. Agree with NJ tube placement and enteral feeding initiation. Talked with nursing. NJ has not been placed at this time. Recommendations for tube feeding above when pt is ready to initiate nutrition support.   Significant weight loss - 8% x 1 month, 12.6% ~3 months and 17% x 5 months. Energy intake < 75%  for >/= 1 month.   Medications reviewed and include: folic acid, protonix, colace, klor-con.   IVF Lactated Ringers with potassium chloride @ 125 ml/hr      Latest Ref Rng & Units 07/05/2022    3:17 AM 07/04/2022    4:23 AM 07/03/2022     4:29 AM  BMP  Glucose 70 - 99 mg/dL 65  77  66   BUN 6 - 20 mg/dL <5  <5  7   Creatinine 0.44 - 1.00 mg/dL 0.36  0.32  0.30   Sodium 135 - 145 mmol/L 137  137  136   Potassium 3.5 - 5.1 mmol/L 4.4  3.9  3.2   Chloride 98 - 111 mmol/L 103  105  104   CO2 22 - 32 mmol/L _0 Calcium 8.9 - 10.3 mg/dL 8.4  8.3  8.3       Diet Order:   Diet Order             Diet clear liquid Room service appropriate? Yes; Fluid consistency: Thin  Diet effective now                  EDUCATION NEEDS:  No education needs have been identified at this time  Skin:  Skin Assessment: Reviewed RN Assessment  Last BM:  11/26  Height:   Ht Readings from Last 1 Encounters:  07/02/22 _1  (1.6 m)    Weight:   Wt Readings from Last 1 Encounters:  07/03/22 81.4 kg    Ideal Body Weight:   36 yo   BMI:  Body mass index is 31.79 kg/m.  Estimated Nutritional Needs:   Kcal:  1800-2000  Protein:  99-110 gr  Fluid:  1.8-2.0 liters daily  Colman Cater MS,RD,CSG,LDN Contact: Shea Evans

## 2022-07-06 DIAGNOSIS — K861 Other chronic pancreatitis: Secondary | ICD-10-CM | POA: Diagnosis not present

## 2022-07-06 DIAGNOSIS — K859 Acute pancreatitis without necrosis or infection, unspecified: Secondary | ICD-10-CM | POA: Diagnosis not present

## 2022-07-06 LAB — COMPREHENSIVE METABOLIC PANEL
ALT: 6 U/L (ref 0–44)
AST: 13 U/L — ABNORMAL LOW (ref 15–41)
Albumin: 2.2 g/dL — ABNORMAL LOW (ref 3.5–5.0)
Alkaline Phosphatase: 63 U/L (ref 38–126)
Anion gap: 7 (ref 5–15)
BUN: <5 mg/dL — ABNORMAL LOW (ref 6–20)
CO2: 31 mmol/L (ref 22–32)
Calcium: 8.6 mg/dL — ABNORMAL LOW (ref 8.9–10.3)
Chloride: 99 mmol/L (ref 98–111)
Creatinine, Ser: 0.34 mg/dL — ABNORMAL LOW (ref 0.44–1.00)
GFR, Estimated: >60 mL/min (ref >60)
Glucose, Bld: 66 mg/dL — ABNORMAL LOW (ref 70–99)
Potassium: 5.2 mmol/L — ABNORMAL HIGH (ref 3.5–5.1)
Sodium: 137 mmol/L (ref 135–145)
Total Bilirubin: 0.8 mg/dL (ref 0.3–1.2)
Total Protein: 6.2 g/dL — ABNORMAL LOW (ref 6.5–8.1)

## 2022-07-06 LAB — CBC WITH DIFFERENTIAL/PLATELET
Abs Immature Granulocytes: 0.01 10*3/uL (ref 0.00–0.07)
Basophils Absolute: 0.1 10*3/uL (ref 0.0–0.1)
Basophils Relative: 1 %
Eosinophils Absolute: 0.3 10*3/uL (ref 0.0–0.5)
Eosinophils Relative: 4 %
HCT: 33.4 % — ABNORMAL LOW (ref 36.0–46.0)
Hemoglobin: 10.1 g/dL — ABNORMAL LOW (ref 12.0–15.0)
Immature Granulocytes: 0 %
Lymphocytes Relative: 34 %
Lymphs Abs: 2.4 10*3/uL (ref 0.7–4.0)
MCH: 29.6 pg (ref 26.0–34.0)
MCHC: 30.2 g/dL (ref 30.0–36.0)
MCV: 97.9 fL (ref 80.0–100.0)
Monocytes Absolute: 0.7 10*3/uL (ref 0.1–1.0)
Monocytes Relative: 9 %
Neutro Abs: 3.8 10*3/uL (ref 1.7–7.7)
Neutrophils Relative %: 52 %
Platelets: 382 10*3/uL (ref 150–400)
RBC: 3.41 MIL/uL — ABNORMAL LOW (ref 3.87–5.11)
RDW: 17.4 % — ABNORMAL HIGH (ref 11.5–15.5)
WBC: 7.2 10*3/uL (ref 4.0–10.5)
nRBC: 0 % (ref 0.0–0.2)

## 2022-07-06 LAB — GLUCOSE, CAPILLARY
Glucose-Capillary: 66 mg/dL — ABNORMAL LOW (ref 70–99)
Glucose-Capillary: 82 mg/dL (ref 70–99)
Glucose-Capillary: 85 mg/dL (ref 70–99)

## 2022-07-06 LAB — MAGNESIUM: Magnesium: 1.6 mg/dL — ABNORMAL LOW (ref 1.7–2.4)

## 2022-07-06 MED ORDER — DEXTROSE IN LACTATED RINGERS 5 % IV SOLN: INTRAVENOUS | Status: DC

## 2022-07-06 MED ORDER — MAGNESIUM SULFATE 2 GM/50ML IV SOLN
2.0000 g | Freq: Once | INTRAVENOUS | Status: AC
  Administered 2022-07-06: 2 g via INTRAVENOUS
  Filled 2022-07-06: qty 50

## 2022-07-06 MED ORDER — LACTATED RINGERS IV SOLN: INTRAVENOUS | Status: DC

## 2022-07-06 NOTE — Progress Notes (Signed)
PROGRESS NOTE    Jillian Shepherd  WNI:[id number] DOB: 1986-03-26 DOA: 06/30/2022 PCP: [name], MD   Brief Narrative:    36 y.o. female with medical history significant of alcohol abuse, B12 deficiency, hypokalemia, Liddle syndrome, history of splenic hemorrhage with splenic subcapsular hematoma, presents to the ED with a chief complaint of abdominal pain.  She was readmitted with acute on chronic pancreatitis in the setting of possible alcohol use.    Assessment & Plan:   Principal Problem:   Acute on chronic pancreatitis Active Problems:   Essential hypertension   Hypokalemia   Acute hypoxic respiratory failure (HCC)   Malnutrition of moderate degree   Diabetes mellitus type II, controlled ([place])  Assessment and Plan:   Acute on chronic pancreatitis - Pt presented with abdominal pain with leukocytosis, thrombocytosis, CT scan showing acute pancreatitis with interval decrease in size of the distal pancreatic body cystic lesion that was likely a pseudocyst.  Component of necrotizing pancreatitis cannot be excluded.  Perisplenic subcapsular hematoma still present-hold anticoagulation - Patient finished Levaquin on November 12 - Discontinue cefepime and Flagyl - Last lipid panel shows triglycerides of 115 - No new medications that could have contributed to this acute flare - Previously thought to be alcoholic pancreatitis, but patient has not had a drink in 1 month per her report - Pt reports pain not controlled, changed to dilaudid 11/27 with better control.  Continue clear liquid diet. Appreciate GI team recommendations. -Unfortunately, diet cannot be progressed given her ongoing symptoms, will place NG tube today per GI recommendations   Diabetes mellitus type II, controlled ([place]) - Hold metformin - Sliding scale coverage - if BS less than 70 would add dextrose to IV fluid, continue to monitor - CLD     Malnutrition of moderate degree - Hypoalbuminemia at 3.2 -  Patient has been on the liquid only diet for a month per her report - only able to tolerate clear liquids at this time.  - obtain nutrition team consult   Acute hypoxic respiratory failure-currently resolved - Likely related to pleural effusion - Pneumonia cannot be excluded on chest x-ray but no clinical symptoms - Patient has recently been on Levaquin and Omnicef plus doxycycline - Discontinue further cefepime - Pain medications could also be contributing to this hypoxia - Wean off as tolerated - Continue to monitor   Hypomagnesemia - Replete and reevaluate in a.m.   Essential hypertension - Continue amlodipine   Obesity -BMI 31.79   DVT prophylaxis: SCDs Code Status: Full Family Communication: None at bedside Disposition Plan:  Status is: Inpatient Remains inpatient appropriate because: Need for IV medications.   Consultants:  GI   Procedures:  None   Antimicrobials:  Anti-infectives (From admission, onward)    Start     Dose/Rate Route Frequency Ordered Stop   07/01/22 0600  metroNIDAZOLE (FLAGYL) IVPB 500 mg  Status:  Discontinued        500 mg 100 mL/hr over 60 Minutes Intravenous Every 12 hours 07/01/22 0314 07/04/22 1110   06/30/22 1830  ceFEPIme (MAXIPIME) 2 g in sodium chloride 0.9 % 100 mL IVPB  Status:  Discontinued        2 g 200 mL/hr over 30 Minutes Intravenous Every 8 hours 06/30/22 1815 07/04/22 1110   06/30/22 1815  ceFEPIme (MAXIPIME) 2 g in sodium chloride 0.9 % 100 mL IVPB  Status:  Discontinued        2 g 200 mL/hr over 30 Minutes Intravenous  Once 06/30/22  1802 06/30/22 1815   06/30/22 1815  metroNIDAZOLE (FLAGYL) IVPB 500 mg        500 mg 100 mL/hr over 60 Minutes Intravenous  Once 06/30/22 1802 06/30/22 1956      Subjective: Patient seen and evaluated today with ongoing complaints of abdominal pain with anorexia as well as some nausea.  Objective: Vitals:   07/05/22 0451 07/05/22 1441 07/05/22 2059 07/06/22 0427  BP: (!) 122/92  131/87 122/83 120/80  Pulse: 83 85 88 92  Resp: '12 18 18 18  '$ Temp: 98.7 F (37.1 C) 98.3 F (36.8 C) 98.5 F (36.9 C) 99.5 F (37.5 C)  TempSrc: Oral  Oral Oral  SpO2: 93% 96% 94% 92%  Weight:      Height:        Intake/Output Summary (Last 24 hours) at 07/06/2022 0934 Last data filed at 07/06/2022 0615 Gross per 24 hour  Intake 7818.82 ml  Output --  Net 7818.82 ml   Filed Weights   06/30/22 1722 07/02/22 0436 07/03/22 2150  Weight: 65.8 kg 75 kg 81.4 kg    Examination:  General exam: Appears calm and comfortable  Respiratory system: Clear to auscultation. Respiratory effort normal. Cardiovascular system: S1 & S2 heard, RRR.  Gastrointestinal system: Abdomen is soft Central nervous system: Alert and awake Extremities: No edema Skin: No significant lesions noted Psychiatry: Flat affect.    Data Reviewed: I have personally reviewed following labs and imaging studies  CBC: Recent Labs  Lab 07/02/22 0338 07/03/22 0429 07/04/22 0423 07/05/22 0317 07/06/22 0353  WBC 7.9 6.9 6.3 6.9 7.2  NEUTROABS 5.3 4.2 3.7 4.0 3.8  HGB 9.7* 10.2* 9.7* 9.8* 10.1*  HCT 30.6* 32.5* 30.4* 30.9* 33.4*  MCV 96.2 98.5 96.5 96.3 97.9  PLT 423* 389 362 359 254   Basic Metabolic Panel: Recent Labs  Lab 07/01/22 0609 07/02/22 0338 07/03/22 0429 07/04/22 0423 07/05/22 0317 07/06/22 0353  NA 136 137 136 137 137 137  K 2.9* 2.9* 3.2* 3.9 4.4 5.2*  CL 98 104 104 105 103 99  CO2 '29 28 28 28 30 31  '$ GLUCOSE 69* 85 66* 77 65* 66*  BUN '11 9 7 '$ <5* <5* <5*  CREATININE 0.41* 0.30* 0.30* 0.32* 0.36* 0.34*  CALCIUM 8.1* 8.0* 8.3* 8.3* 8.4* 8.6*  MG 2.1  --  1.5* 1.7 1.5* 1.6*   GFR: Estimated Creatinine Clearance: 97.3 mL/min (A) (by C-G formula based on SCr of 0.34 mg/dL (L)). Liver Function Tests: Recent Labs  Lab 07/02/22 0338 07/03/22 0429 07/04/22 0423 07/05/22 0317 07/06/22 0353  AST 14* 12* 12* 14* 13*  ALT '5 7 6 7 6  '$ ALKPHOS 64 65 60 63 63  BILITOT 0.6 0.6 0.7 0.7 0.8   PROT 5.8* 6.0* 5.9* 6.0* 6.2*  ALBUMIN 2.2* 2.3* 2.1* 2.2* 2.2*   Recent Labs  Lab 06/30/22 1754 07/01/22 0609 07/02/22 0338 07/03/22 0429 07/04/22 0423  LIPASE 108* 130* 64* 80* 90*   No results for input(s): "AMMONIA" in the last 168 hours. Coagulation Profile: Recent Labs  Lab 06/30/22 1754  INR 1.1   Cardiac Enzymes: No results for input(s): "CKTOTAL", "CKMB", "CKMBINDEX", "TROPONINI" in the last 168 hours. BNP (last 3 results) No results for input(s): "PROBNP" in the last 8760 hours. HbA1C: No results for input(s): "HGBA1C" in the last 72 hours. CBG: Recent Labs  Lab 07/05/22 0720 07/05/22 1125 07/05/22 1638 07/06/22 0005 07/06/22 0721  GLUCAP 75 92 78 82 85   Lipid Profile: No results for input(s): "CHOL", "  HDL", "LDLCALC", "TRIG", "CHOLHDL", "LDLDIRECT" in the last 72 hours. Thyroid Function Tests: No results for input(s): "TSH", "T4TOTAL", "FREET4", "T3FREE", "THYROIDAB" in the last 72 hours. Anemia Panel: No results for input(s): "VITAMINB12", "FOLATE", "FERRITIN", "TIBC", "IRON", "RETICCTPCT" in the last 72 hours. Sepsis Labs: Recent Labs  Lab 06/30/22 1754 06/30/22 2017  LATICACIDVEN 1.0 0.9    Recent Results (from the past 240 hour(s))  Resp Panel by RT-PCR (Flu A&B, Covid) Anterior Nasal Swab     Status: None   Collection Time: 06/30/22  6:02 PM   Specimen: Anterior Nasal Swab  Result Value Ref Range Status   SARS Coronavirus 2 by RT PCR NEGATIVE NEGATIVE Final    Comment: (NOTE) SARS-CoV-2 target nucleic acids are NOT DETECTED.  The SARS-CoV-2 RNA is generally detectable in upper respiratory specimens during the acute phase of infection. The lowest concentration of SARS-CoV-2 viral copies this assay can detect is 138 copies/mL. A negative result does not preclude SARS-Cov-2 infection and should not be used as the sole basis for treatment or other patient management decisions. A negative result may occur with  improper specimen  collection/handling, submission of specimen other than nasopharyngeal swab, presence of viral mutation(s) within the areas targeted by this assay, and inadequate number of viral copies(<138 copies/mL). A negative result must be combined with clinical observations, patient history, and epidemiological information. The expected result is Negative.  Fact Sheet for Patients:  EntrepreneurPulse.com.au  Fact Sheet for Healthcare Providers:  IncredibleEmployment.be  This test is no t yet approved or cleared by the [place] FDA and  has been authorized for detection and/or diagnosis of SARS-CoV-2 by FDA under an Emergency Use Authorization (EUA). This EUA will remain  in effect (meaning this test can be used) for the duration of the COVID-19 declaration under Section 564(b)(1) of the Act, 21 U.S.C.section 360bbb-3(b)(1), unless the authorization is terminated  or revoked sooner.       Influenza A by PCR NEGATIVE NEGATIVE Final   Influenza B by PCR NEGATIVE NEGATIVE Final    Comment: (NOTE) The Xpert Xpress SARS-CoV-2/FLU/RSV plus assay is intended as an aid in the diagnosis of influenza from Nasopharyngeal swab specimens and should not be used as a sole basis for treatment. Nasal washings and aspirates are unacceptable for Xpert Xpress SARS-CoV-2/FLU/RSV testing.  Fact Sheet for Patients: EntrepreneurPulse.com.au  Fact Sheet for Healthcare Providers: IncredibleEmployment.be  This test is not yet approved or cleared by the [place] FDA and has been authorized for detection and/or diagnosis of SARS-CoV-2 by FDA under an Emergency Use Authorization (EUA). This EUA will remain in effect (meaning this test can be used) for the duration of the COVID-19 declaration under Section 564(b)(1) of the Act, 21 U.S.C. section 360bbb-3(b)(1), unless the authorization is terminated or revoked.  Performed at [facility], [address], [place], [place] [address]   Blood culture (routine x 2)     Status: None   Collection Time: 06/30/22  6:13 PM   Specimen: BLOOD LEFT FOREARM  Result Value Ref Range Status   Specimen Description   Final    BLOOD LEFT FOREARM BOTTLES DRAWN AEROBIC AND ANAEROBIC   Special Requests Blood Culture adequate volume  Final   Culture   Final    NO GROWTH 5 DAYS Performed at [facility], [address], [place], [place] [address]    Report Status 07/05/2022 FINAL  Final  Blood culture (routine x 2)     Status: None   Collection Time: 06/30/22  6:29 PM   Specimen: Right Antecubital; Blood  Result Value Ref Range Status   Specimen Description   Final    RIGHT ANTECUBITAL BOTTLES DRAWN AEROBIC AND ANAEROBIC   Special Requests Blood Culture adequate volume  Final   Culture   Final    NO GROWTH 5 DAYS Performed at [facility], [address] [place], [place] [address]    Report Status 07/05/2022 FINAL  Final  Urine Culture     Status: None   Collection Time: 06/30/22  9:21 PM   Specimen: Urine, Clean Catch  Result Value Ref Range Status   Specimen Description   Final    URINE, CLEAN CATCH Performed at [facility], [address], [place], [place] [address]    Special Requests   Final    NONE Performed at [facility], [address] [place], [place] [address]    Culture   Final    NO GROWTH Performed at [facility], [place] [address] [place], [place] [address]    Report Status 07/02/2022 FINAL  Final  MRSA Next Gen by PCR, Nasal     Status: None   Collection Time: 07/01/22  3:09 AM   Specimen: Nasal Mucosa; Nasal Swab  Result Value Ref Range Status   MRSA by PCR Next Gen NOT DETECTED NOT DETECTED Final    Comment: (NOTE) The GeneXpert MRSA Assay (FDA approved for NASAL specimens only), is one component of a comprehensive MRSA colonization surveillance program. It is not intended to diagnose MRSA infection nor to guide or monitor treatment for MRSA  infections. Test performance is not FDA approved in patients less than 12 years old. Performed at [facility], [address] [place], [place] [address]          Radiology Studies: No results found.      Scheduled Meds:  amLODipine  10 mg Oral Daily   Chlorhexidine Gluconate Cloth  6 each Topical Daily   docusate sodium  200 mg Oral Daily   folic acid  1 mg Oral Daily   pantoprazole (PROTONIX) IV  40 mg Intravenous Q24H   Continuous Infusions:  lactated ringers     magnesium sulfate bolus IVPB       LOS: 6 days    Time spent: 35 minutes    Sherma Vanmetre Darleen Crocker, DO [facility]  If 7PM-7AM, please contact night-coverage [email or web address] 07/06/2022, 9:34 AM

## 2022-07-06 NOTE — Progress Notes (Signed)
Gastroenterology Progress Note   Referring Provider: No ref. provider found Primary Care Physician:  [name], MD Primary Gastroenterologist:  Dr.  Patient ID: Jillian Shepherd; [id number]; Jul 14, 1986    Subjective   Continued pain in the epigastric region.  Able to tolerate apple juice, Jell-O, mango ice.  Unable to tolerate broth due to salty flavor.  Continues to request pain medication every 2 hours.  Continues to be concerned about her weakness and her nutrition.  She is passing gas.  Has not had a bowel movement.  Previously did not tolerate MiraLAX.    Objective   Vital signs in last 24 hours Temp:  [98.3 F (36.8 C)-99.5 F (37.5 C)] 99.5 F (37.5 C) (12/01 0427) Pulse Rate:  [85-92] 92 (12/01 0427) Resp:  [18] 18 (12/01 0427) BP: (120-131)/(80-87) 120/80 (12/01 0427) SpO2:  [92 %-96 %] 92 % (12/01 0427) Last BM Date : 07/01/22  Physical Exam General:   Alert and oriented, pleasant, no acute distress.  On computer when entering room. Head:  Normocephalic and atraumatic. Eyes:  No icterus, sclera clear. Conjuctiva pink.  Abdomen:  Bowel sounds present, non-distended.  Abdomen soft, ttp to epigastrium.  No HSM or hernias noted. No rebound or guarding. No masses appreciated  Extremities:  Without clubbing or edema. Neurologic:  Alert and  oriented x4;  grossly normal neurologically. Skin:  Warm and dry, intact without significant lesions.  Psych:  Alert and cooperative. Normal mood and affect.  Intake/Output from previous day: 11/30 0701 - 12/01 0700 In: 8178.8 [P.O.:4324; I.V.:3854.8] Out: -  Intake/Output this shift: No intake/output data recorded.  Lab Results  Recent Labs    07/04/22 0423 07/05/22 0317 07/06/22 0353  WBC 6.3 6.9 7.2  HGB 9.7* 9.8* 10.1*  HCT 30.4* 30.9* 33.4*  PLT 362 359 382   BMET Recent Labs    07/04/22 0423 07/05/22 0317 07/06/22 0353  NA 137 137 137  K 3.9 4.4 5.2*  CL 105 103 99  CO2 _0 GLUCOSE 77 65*  66*  BUN <5* <5* <5*  CREATININE 0.32* 0.36* 0.34*  CALCIUM 8.3* 8.4* 8.6*   LFT Recent Labs    07/04/22 0423 07/05/22 0317 07/06/22 0353  PROT 5.9* 6.0* 6.2*  ALBUMIN 2.1* 2.2* 2.2*  AST 12* 14* 13*  ALT _1 ALKPHOS 60 63 63  BILITOT 0.7 0.7 0.8   PT/INR No results for input(s): "LABPROT", "INR" in the last 72 hours. Hepatitis Panel No results for input(s): "HEPBSAG", "HCVAB", "HEPAIGM", "HEPBIGM" in the last 72 hours.   Studies/Results CT ABDOMEN PELVIS W CONTRAST  Result Date: 06/30/2022 CLINICAL DATA:  Abdominal pain, acute, nonlocalized diffuse pain, recent pancreatitis EXAM: CT ABDOMEN AND PELVIS WITH CONTRAST TECHNIQUE: Multidetector CT imaging of the abdomen and pelvis was performed using the standard protocol following bolus administration of intravenous contrast. RADIATION DOSE REDUCTION: This exam was performed according to the departmental dose-optimization program which includes automated exposure control, adjustment of the mA and/or kV according to patient size and/or use of iterative reconstruction technique. CONTRAST:  131m OMNIPAQUE IOHEXOL 300 MG/ML  SOLN COMPARISON:  None Available. FINDINGS: Lower chest: Trace bilateral, left greater than right, pleural effusion. Associated passive atelectasis of left lower lobe. Hepatobiliary: The liver is enlarged measuring up to 19 cm. No focal liver abnormality. No gallstones or definite gallbladder wall thickening. Positive for pericholecystic fluid. No biliary dilatation. Pancreas: No focal lesion. Hazy pancreatic contour with associated peripancreatic fat stranding and free fluid. Slightly  decreased in size cystic lesion measuring 2 x 2 cm along the distal pancreatic body. Poorly visualized distal pancreatic tail likely due to cystic changes and edema with underlying nonenhancement of the pancreatic tail not fully excluded. Interval resolution of cystic lesion along the greater curvature of the stomach. No main pancreatic  ductal dilatation. Spleen: Similar-appearing heterogeneous 14 x 8 cm perisplenic subcapsular lesion likely representing a hematoma. Adrenals/Urinary Tract: No focal lesion. Normal pancreatic contour. No surrounding inflammatory changes. No main pancreatic ductal dilatation. Stomach/Bowel: Stomach is within normal limits. No evidence of bowel wall thickening or dilatation. Appendix appears normal. Vascular/Lymphatic: No splenic artery aneurysm identified. Venous collaterals noted. No abdominal aorta or iliac aneurysm. Mild atherosclerotic plaque of the aorta and its branches. No abdominal, pelvic, or inguinal lymphadenopathy. Reproductive: Uterus and bilateral adnexa are unremarkable. Other: Small volume simple free fluid ascites. No intraperitoneal free gas. No organized fluid collection. Musculoskeletal: No abdominal wall hernia or abnormality. Soft tissue density along the anterior abdominal wall likely due to medication injection. No suspicious lytic or blastic osseous lesions. No acute displaced fracture. Multilevel degenerative changes of the spine. IMPRESSION: 1. Acute pancreatitis with interval decrease in size of a distal pancreatic body cystic lesion likely representing a pseudocyst. Interval resolution of cystic lesion along the greater curvature of the stomach. Component of necrotizing pancreatitis along the pancreatic tail is not fully excluded. 2. Similar-appearing heterogeneous 14 x 8 cm perisplenic subcapsular hematoma. 3. Nonspecific pericholecystic fluid with no definite CT findings acute cholecystitis. 4. Small volume simple free fluid ascites. 5. Trace bilateral, left greater than right, pleural effusion. 6. Mild hepatomegaly. 7. Venous collaterals. Electronically Signed   By: Iven Finn M.D.   On: 06/30/2022 22:45   DG Chest Port 1 View  Result Date: 06/30/2022 CLINICAL DATA:  Recent effusion with decreased lung sounds on the left EXAM: PORTABLE CHEST 1 VIEW COMPARISON:  Radiographs  06/13/2022 FINDINGS: Slight decrease in small left pleural effusion and associated atelectasis since 06/13/2022. Possible trace right pleural effusion. No pneumothorax. Stable cardiomediastinal silhouette. No acute osseous abnormality. IMPRESSION: Since 06/13/2022, decreased small left pleural effusion and associated atelectasis. Pneumonia is difficult to exclude. Electronically Signed   By: Placido Sou M.D.   On: 06/30/2022 18:26   DG CHEST PORT 1 VIEW  Result Date: 06/13/2022 CLINICAL DATA:  Left pleural effusion vision. EXAM: PORTABLE CHEST 1 VIEW COMPARISON:  Radiograph earlier today. FINDINGS: The previous pigtail catheter in the left hemithorax is no longer seen. Similar size left pleural effusion and basilar opacity from earlier today. No visualized pneumothorax. Overall low lung volumes without focal right lung airspace disease. Stable heart size and mediastinal contours. IMPRESSION: Removal of pigtail catheter from the left hemithorax. Similar size left pleural effusion and basilar opacity from earlier today. No visualized pneumothorax. Electronically Signed   By: Keith Rake M.D.   On: 06/13/2022 15:54   DG Chest Port 1 View  Result Date: 06/13/2022 CLINICAL DATA:  Chest tube EXAM: PORTABLE CHEST 1 VIEW COMPARISON:  Portable exam 0632 hours compared to 06/12/2022 FINDINGS: Pigtail LEFT thoracostomy tube again seen. Enlargement of cardiac silhouette. Mediastinal contours and pulmonary vascularity normal. Persistent LEFT pleural effusion and bibasilar atelectasis. Persistent dense opacity of LEFT lower lobe may represent atelectasis or pneumonia. No pneumothorax or acute osseous findings. IMPRESSION: Bibasilar atelectasis and LEFT pleural effusion with persistent LEFT lower lobe opacity, question atelectasis versus pneumonia. Electronically Signed   By: Lavonia Dana M.D.   On: 06/13/2022 08:47   DG CHEST PORT 1  VIEW  Result Date: 06/12/2022 CLINICAL DATA:  Pleural effusion, chest tube  EXAM: PORTABLE CHEST 1 VIEW COMPARISON:  Portable exam 0640 hours compared to 06/11/2022 FINDINGS: Pigtail LEFT thoracostomy tube unchanged. Enlargement of cardiac silhouette. Mediastinal contour stable. Small LEFT pleural effusion and basilar atelectasis. No definite acute infiltrate, pneumothorax or acute osseous findings. IMPRESSION: Enlargement of cardiac silhouette. Persistent LEFT pleural effusion and basilar atelectasis despite pigtail thoracostomy tube. Electronically Signed   By: Lavonia Dana M.D.   On: 06/12/2022 10:00   DG CHEST PORT 1 VIEW  Result Date: 06/11/2022 CLINICAL DATA:  Left-sided chest tube. EXAM: PORTABLE CHEST 1 VIEW COMPARISON:  June 10, 2022. FINDINGS: Stable cardiomegaly. Stable left-sided chest tube is noted no definite pneumothorax is noted currently. Left pleural effusion and left basilar atelectasis is again noted. IMPRESSION: Stable position of left-sided chest tube with possible kink within it. No definite pneumothorax is noted. Stable left basilar opacity. Electronically Signed   By: Marijo Conception M.D.   On: 06/11/2022 08:16   DG Chest Port 1 View  Result Date: 06/10/2022 CLINICAL DATA:  Left chest tube placement EXAM: PORTABLE CHEST 1 VIEW COMPARISON:  Previous studies including the examination done earlier today FINDINGS: Transverse diameter of heart is increased. There is significant interval decrease in left pleural effusion after placement of left chest tube. There is a kink in the distal course of the left chest tube approximately 7 cm from the tip. There is possible trace amount of pneumothorax in the lateral aspect of left upper lung field. There is possible tiny left apical pneumothorax. Residual increased density in left lower lung fields suggest possible underlying atelectasis/pneumonia. There is increase in patchy infiltrate in right upper lung fields. IMPRESSION: There is marked interval decrease in left pleural effusion after placement of left chest tube.  Residual increased density in left lower lung fields may be due to pleural effusion and underlying atelectasis/pneumonia. A kink is noted in the distal course of left chest tube. Possible tiny pneumothorax is noted in the left apex and lateral aspect of left upper lung field. There is interval increase in patchy infiltrate in right upper lung field suggesting worsening of pneumonia. Electronically Signed   By: Elmer Picker M.D.   On: 06/10/2022 11:57   DG Abd 1 View  Result Date: 06/10/2022 CLINICAL DATA:  Abdominal pain and distension EXAM: ABDOMEN - 1 VIEW COMPARISON:  None Available. FINDINGS: Scattered large and small bowel gas is noted. No free air is seen. Increasing left-sided effusion is noted when compare with the prior chest exam. No acute bony abnormality is seen. IMPRESSION: Unremarkable abdomen. Increasing left-sided pleural effusion. Electronically Signed   By: Inez Catalina M.D.   On: 06/10/2022 09:56   DG CHEST PORT 1 VIEW  Result Date: 06/10/2022 CLINICAL DATA:  Shortness of breath EXAM: PORTABLE CHEST 1 VIEW COMPARISON:  06/09/2022 FINDINGS: Cardiac shadow is enlarged but stable. Increasing left-sided pleural effusion is noted when compare with the prior exam. Persistent and slightly increased airspace opacity in the right apex is noted. No bony abnormality is noted. IMPRESSION: Increasing left-sided effusion compared with the prior exam. Increasing right apical airspace opacity. Electronically Signed   By: Inez Catalina M.D.   On: 06/10/2022 09:55   DG CHEST PORT 1 VIEW  Result Date: 06/09/2022 CLINICAL DATA:  Post left-sided thoracentesis. EXAM: PORTABLE CHEST 1 VIEW COMPARISON:  CT abdomen and pelvis-06/08/2022; chest radiograph-06/01/2022 FINDINGS: Moderate-sized potentially partially loculated residual left-sided pleural effusion post thoracentesis. No pneumothorax. There is  obscuration of the left heart border secondary to left-sided effusion associated left mid and lower  lung heterogeneous/consolidative opacities. Trace right-sided pleural effusion is not excluded. Pulmonary vasculature is indistinct with cephalization of flow. No acute osseous abnormalities. IMPRESSION: 1. Moderate-sized potentially partially loculated residual left-sided effusion post thoracentesis. No pneumothorax. 2. Left mid and lower lung heterogeneous/consolidative opacities, potentially atelectasis though worrisome for multifocal infection. 3. Suspected trace right-sided effusion with suspected mild pulmonary edema. Electronically Signed   By: Sandi Mariscal M.D.   On: 06/09/2022 11:13   US THORACENTESIS ASP PLEURAL SPACE W/IMG GUIDE  Result Date: 06/09/2022 INDICATION: Left pleural effusion, shortness of breath EXAM: ULTRASOUND GUIDED LEFT THORACENTESIS MEDICATIONS: None. COMPLICATIONS: None immediate. PROCEDURE: An ultrasound guided thoracentesis was thoroughly discussed with the patient and questions answered. The benefits, risks, alternatives and complications were also discussed. The patient understands and wishes to proceed with the procedure. Written consent was obtained. Ultrasound was performed to localize and mark an adequate pocket of fluid in the left chest. The area was then prepped and draped in the normal sterile fashion. 1% Lidocaine was used for local anesthesia. Under ultrasound guidance a 6 Fr Safe-T-Centesis catheter was introduced. Thoracentesis was performed. The catheter was removed and a dressing applied. FINDINGS: A total of approximately 500 cc of pleural fluid was removed. Samples were sent to the laboratory as requested by the clinical team. IMPRESSION: Successful ultrasound guided left thoracentesis yielding 500 cc of pleural fluid. Performed and dictated by Pasty Spillers, PA-C Electronically Signed   By: Sandi Mariscal M.D.   On: 06/09/2022 11:09   CT ABDOMEN PELVIS W CONTRAST  Result Date: 06/08/2022 CLINICAL DATA:  Nausea, vomiting, epigastric pain, splenic vein  thrombosis, pancreatic necrosis, leukocytosis EXAM: CT ABDOMEN AND PELVIS WITH CONTRAST TECHNIQUE: Multidetector CT imaging of the abdomen and pelvis was performed using the standard protocol following bolus administration of intravenous contrast. RADIATION DOSE REDUCTION: This exam was performed according to the departmental dose-optimization program which includes automated exposure control, adjustment of the mA and/or kV according to patient size and/or use of iterative reconstruction technique. CONTRAST:  153m OMNIPAQUE IOHEXOL 300 MG/ML  SOLN COMPARISON:  05/31/2022 FINDINGS: Lower chest: New moderate left pleural effusion and consolidation/atelectasis in the visualized dependent aspect of the left lung. Trace right pleural fluid. No pericardial effusion. Hepatobiliary: Gallbladder is physiologically distended without calcified stones. No focal liver lesion or biliary ductal dilatation. Pancreas: Perhaps marginal improvement in the peripancreatic inflammatory change. Peripancreatic pseudocysts with dominant cluster near the body/tail junction are slightly better marginated, similar in size. There is a new well-marginated pseudocyst abutting the lateral wall of gastric body, 4.4 x 2.7 cm. Pancreatic parenchymal enhancement stable. Spleen: Interval development of large mixed-attenuation perisplenic or subcapsular hematoma , margins difficult to differentiate from the spleen itself, the overall process measuring 14 x 10.2 x 9.9 cm (740cc) , compared to original spleen dimensions 11.8 x 4.1 x7.4 cm (187cc) on prior study. No convincing active extravasation on the single phase of the scan. Adrenals/Urinary Tract: No adrenal mass. Symmetric renal enhancement without focal lesion or hydronephrosis. Urinary bladder physiologically distended. Stomach/Bowel: Stomach is incompletely distended, with mucosal enhancement. The small bowel is decompressed. Normal appendix. Colon is incompletely distended, unremarkable.  Vascular/Lymphatic: No significant arterial pathology evident. Portal vein patent. Incomplete opacification of the splenic vein consistent with previously described splenic vein thrombosis. SMV is patent. Bilateral renal veins patent. No abdominal or pelvic adenopathy localized. Reproductive: Uterus and bilateral adnexa are unremarkable. Other: Small volume pelvic ascites. Infiltrative changes  in the right upper quadrant and perisplenic mesenteric and retroperitoneal fat may be related to pancreatitis or the splenic hemorrhage. Musculoskeletal: No acute or significant osseous findings. IMPRESSION: 1. Interval development of 500+mL mixed-attenuation perisplenic/subcapsular splenic hematoma, without evident active extravasation. 2. New 4.4 cm pseudocyst abutting the lateral wall of the gastric body. 3. New moderate left pleural effusion and consolidation/atelectasis in the visualized dependent aspect of the left lung. 4. Some improvement in peripancreatic inflammatory change with stable peripancreatic pseudocysts. 5. Small volume pelvic ascites. 6. Incomplete opacification of the splenic vein consistent with previously described splenic vein thrombosis. Electronically Signed   By: Lucrezia Europe M.D.   On: 06/08/2022 13:32    Assessment  36 y.o. 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  Acute on chronic pancreatitis: Secondary to alcohol use.  CT is admission with pancreatitis with distal pancreatic body cystic lesion, likely pseudocyst.  Previous admission with pancreatitis complicated by splenic vein thrombosis/perisplenic hematoma while receiving anticoagulation.  Difficult to tell if there has been complete resolution of splenic vein thrombosis on most recent  CT.  Hematoma remains stable.  Current CT imaging unable to exclude component of necrotizing pancreatitis.  She continues to require pain medication every 2 hours.  Has had difficulty advancing her diet on discharge and submission.  Continues to feel very weak and has been experiencing inadequate nutrition.  Low albumin.  Blood glucose 65-80.   Nasojejunal tube ordered to be placed by radiology yesterday.  Nursing discussions with radiology today stated that they preferred nursing place regular NG tube or provider attempt placement first.  Nursing attempted placement of regular NG tube and was unable to in the left nare x 2, resistance met in the right nare and patient requested to stop given pain.  Order placed for radiology to insert Dobbhoff tube for patient to receive supplemental feeding.  Dietitian has been following and recommendations have been made.   Anemia: Hemoglobin 10.1 today, previously 10.7 on discharge.  No signs of any overt GI bleeding.  EGD and TCS in 2022 mild reactive gastropathy and superficial reactive changes consistent with a mucosal injury single polyp removed from transverse colon..  Plan / Recommendations  Dubhoff tube placement with radiology. Begin tube feeds, elemental or semielemental formula per dietician. Continue PPI Continue docusate sodium 200 mg daily Consider adding Senokot nightly. Continue to monitor blood glucose.    LOS: 6 days    07/06/2022, 9:06 AM   Venetia Night, MSN, FNP-BC, AGACNP-BC [facility]

## 2022-07-06 NOTE — Progress Notes (Signed)
Patient started on Dextrose IV fluids. Patient remains asymptomatic. Alert and oriented.

## 2022-07-06 NOTE — Progress Notes (Signed)
Patient blood glucose 66. Notified Dr. Manuella Ghazi of this.

## 2022-07-06 NOTE — Progress Notes (Signed)
Patient is currently resting in her bed at this time. Patient has gotten up to the bedside commode independently x3 during this shift. Patient has been given prn medication approximately every 2 hours during this shift. See MAR. Most recent vitals signs at T 99.5 P 92 RR 18 B/P 120/80 O2 sat 92% on 3L [place].

## 2022-07-07 ENCOUNTER — Inpatient Hospital Stay (HOSPITAL_COMMUNITY): Payer: 59

## 2022-07-07 DIAGNOSIS — E860 Dehydration: Secondary | ICD-10-CM

## 2022-07-07 DIAGNOSIS — K859 Acute pancreatitis without necrosis or infection, unspecified: Secondary | ICD-10-CM | POA: Diagnosis not present

## 2022-07-07 DIAGNOSIS — K861 Other chronic pancreatitis: Secondary | ICD-10-CM | POA: Diagnosis not present

## 2022-07-07 LAB — COMPREHENSIVE METABOLIC PANEL
ALT: 7 U/L (ref 0–44)
AST: 14 U/L — ABNORMAL LOW (ref 15–41)
Albumin: 2.3 g/dL — ABNORMAL LOW (ref 3.5–5.0)
Alkaline Phosphatase: 62 U/L (ref 38–126)
Anion gap: 7 (ref 5–15)
BUN: <5 mg/dL — ABNORMAL LOW (ref 6–20)
CO2: 31 mmol/L (ref 22–32)
Calcium: 8.6 mg/dL — ABNORMAL LOW (ref 8.9–10.3)
Chloride: 98 mmol/L (ref 98–111)
Creatinine, Ser: 0.36 mg/dL — ABNORMAL LOW (ref 0.44–1.00)
GFR, Estimated: >60 mL/min (ref >60)
Glucose, Bld: 80 mg/dL (ref 70–99)
Potassium: 4 mmol/L (ref 3.5–5.1)
Sodium: 136 mmol/L (ref 135–145)
Total Bilirubin: 0.4 mg/dL (ref 0.3–1.2)
Total Protein: 6.7 g/dL (ref 6.5–8.1)

## 2022-07-07 LAB — CBC
HCT: 35.4 % — ABNORMAL LOW (ref 36.0–46.0)
Hemoglobin: 11 g/dL — ABNORMAL LOW (ref 12.0–15.0)
MCH: 29.7 pg (ref 26.0–34.0)
MCHC: 31.1 g/dL (ref 30.0–36.0)
MCV: 95.7 fL (ref 80.0–100.0)
Platelets: 400 10*3/uL (ref 150–400)
RBC: 3.7 MIL/uL — ABNORMAL LOW (ref 3.87–5.11)
RDW: 17.4 % — ABNORMAL HIGH (ref 11.5–15.5)
WBC: 6.7 10*3/uL (ref 4.0–10.5)
nRBC: 0 % (ref 0.0–0.2)

## 2022-07-07 LAB — GLUCOSE, CAPILLARY
Glucose-Capillary: 101 mg/dL — ABNORMAL HIGH (ref 70–99)
Glucose-Capillary: 144 mg/dL — ABNORMAL HIGH (ref 70–99)
Glucose-Capillary: 154 mg/dL — ABNORMAL HIGH (ref 70–99)
Glucose-Capillary: 98 mg/dL (ref 70–99)

## 2022-07-07 LAB — MAGNESIUM: Magnesium: 1.7 mg/dL (ref 1.7–2.4)

## 2022-07-07 MED ORDER — PROSOURCE TF20 ENFIT COMPATIBL EN LIQD
60.0000 mL | Freq: Every day | ENTERAL | Status: DC
  Administered 2022-07-07 – 2022-07-11 (×5): 60 mL
  Filled 2022-07-07 (×5): qty 60

## 2022-07-07 MED ORDER — MENTHOL 3 MG MT LOZG
1.0000 | LOZENGE | OROMUCOSAL | Status: DC | PRN
  Administered 2022-07-07 – 2022-07-08 (×3): 3 mg via ORAL
  Filled 2022-07-07: qty 9

## 2022-07-07 MED ORDER — VITAL 1.5 CAL PO LIQD
1000.0000 mL | ORAL | Status: DC
  Administered 2022-07-07 – 2022-07-11 (×6): 1000 mL
  Filled 2022-07-07: qty 1000

## 2022-07-07 NOTE — Progress Notes (Signed)
Pt A&O X 4. Pt has reported 10/10 pain in abdomen multiple times through the night, 1 mg PRN dilaudid given. Vitals stable. Non-pitting edema present in upper and lower extremities, bilaterally.

## 2022-07-07 NOTE — Consult Note (Signed)
Asked to place NG tube for tube feed to help treat her chronic pancreatitis.  Multiple attempts have been done yesterday.  I was able to place a 12 [place] NG tube through the right nares.  Patient tolerated the procedure well.  Chest x-ray reveals appropriate position in the distal stomach.  May start tube feedings.

## 2022-07-07 NOTE — Progress Notes (Signed)
Subjective:   Objective: Vital signs in last 24 hours: Temp:  [97.6 F (36.4 C)-98.8 F (37.1 C)] 97.6 F (36.4 C) (12/02 0833) Pulse Rate:  [87-96] 90 (12/02 0833) Resp:  [16-20] 18 (12/02 0833) BP: (113-133)/(76-88) 129/82 (12/02 0833) SpO2:  [93 %-96 %] 94 % (12/02 0833) Last BM Date : 07/01/22 General:   Alert and oriented, pleasant Head:  Normocephalic and atraumatic. Eyes:  No icterus, sclera clear. Conjuctiva pink.  Abdomen:  Bowel sounds present, soft, non-tender, non-distended. No HSM or hernias noted. No rebound or guarding. No masses appreciated  Msk:  Symmetrical without gross deformities. Normal posture. Extremities:  Without clubbing or edema. Neurologic:  Alert and  oriented x4;  grossly normal neurologically. Skin:  Warm and dry, intact without significant lesions.  Cervical Nodes:  No significant cervical adenopathy. Psych:  Alert and cooperative. Normal mood and affect.  Intake/Output from previous day: 12/01 0701 - 12/02 0700 In: 924 [P.O.:240; I.V.:684] Out: 2000 [Urine:2000] Intake/Output this shift: No intake/output data recorded.  Lab Results: Recent Labs    07/05/22 0317 07/06/22 0353 07/07/22 0344  WBC 6.9 7.2 6.7  HGB 9.8* 10.1* 11.0*  HCT 30.9* 33.4* 35.4*  PLT 359 382 400   BMET Recent Labs    07/05/22 0317 07/06/22 0353 07/07/22 0344  NA 137 137 136  K 4.4 5.2* 4.0  CL 103 99 98  CO2 '30 31 31  '$ GLUCOSE 65* 66* 80  BUN <5* <5* <5*  CREATININE 0.36* 0.34* 0.36*  CALCIUM 8.4* 8.6* 8.6*   LFT Recent Labs    07/05/22 0317 07/06/22 0353 07/07/22 0344  PROT 6.0* 6.2* 6.7  ALBUMIN 2.2* 2.2* 2.3*  AST 14* 13* 14*  ALT '7 6 7  '$ ALKPHOS 63 63 62  BILITOT 0.7 0.8 0.4   PT/INR No results for input(s): "LABPROT", "INR" in the last 72 hours. Hepatitis Panel No results for input(s): "HEPBSAG", "HCVAB", "HEPAIGM", "HEPBIGM" in the last 72 hours.   Studies/Results: DG Chest Port 1 View  Result Date: 07/07/2022 CLINICAL DATA:   Assess for nasogastric tube placement EXAM: PORTABLE CHEST 1 VIEW COMPARISON:  June 30, 2022 FINDINGS: The heart size and mediastinal contours are stable. Patchy consolidation of left lung base with left pleural effusion noted. Mild patchy consolidation of medial right lung base is noted. Nasogastric tube is identified with distal tip in the distal stomach. The visualized skeletal structures are stable. IMPRESSION: Nasogastric tube with distal tip in the distal stomach. Patchy consolidation of left lung base with left pleural effusion noted. Mild patchy consolidation of medial right lung base is noted. Electronically Signed   By: Abelardo Diesel M.D.   On: 07/07/2022 09:19    Assessment: *Acute on chronic pancreatitis complicated by pseudocyst formation *?  Necrotizing pancreatitis *Malnutrition and weight loss  Plan: NG tube placed this morning by Dr. Arnoldo Morale.  Appreciate his help.  Start tube feeds today.  Continue PPI.  Continue bowel regimen.  May need to consider repeat CT imaging given questionable necrotizing pancreatitis on 06/30/2022 pending clinical course.  Otherwise supportive care.  Elon Alas. Abbey Chatters, D.O. Gastroenterology and Hepatology [facility]   LOS: 7 days    07/07/2022, 11:59 AM

## 2022-07-07 NOTE — Progress Notes (Signed)
PROGRESS NOTE    Jillian Shepherd  HEN:[id number] DOB: [birth year] DOA: 06/30/2022 PCP: [name], MD   Brief Narrative:    36 y.o. female with medical history significant of alcohol abuse, B12 deficiency, hypokalemia, Liddle syndrome, history of splenic hemorrhage with splenic subcapsular hematoma, presents to the ED with a chief complaint of abdominal pain.  She was readmitted with acute on chronic pancreatitis in the setting of possible alcohol use.     Assessment & Plan:   Principal Problem:   Acute on chronic pancreatitis Active Problems:   Essential hypertension   Hypokalemia   Acute hypoxic respiratory failure (HCC)   Malnutrition of moderate degree   Diabetes mellitus type II, controlled ([place])  Assessment and Plan:   Acute on chronic pancreatitis - Pt presented with abdominal pain with leukocytosis, thrombocytosis, CT scan showing acute pancreatitis with interval decrease in size of the distal pancreatic body cystic lesion that was likely a pseudocyst.  Component of necrotizing pancreatitis cannot be excluded.  Perisplenic subcapsular hematoma still present-hold anticoagulation - Patient finished Levaquin on November 12 - Discontinue cefepime and Flagyl - Last lipid panel shows triglycerides of 115 - No new medications that could have contributed to this acute flare - Previously thought to be alcoholic pancreatitis, but patient has not had a drink in 1 month per her report - Pt reports pain not controlled, changed to dilaudid 11/27 with better control.  Continue clear liquid diet. Appreciate GI team recommendations. -NG tube placed by general surgery 12/2 and tube feedings will be initiated with appropriate positioning noted on KUB   Diabetes mellitus type II, controlled ([place]) - Hold metformin - Sliding scale coverage - if BS less than 70 would add dextrose to IV fluid, continue to monitor - CLD     Malnutrition of moderate degree - Hypoalbuminemia at 3.2 -  Patient has been on the liquid only diet for a month per her report - only able to tolerate clear liquids at this time.  - obtain nutrition team consult   Acute hypoxic respiratory failure-currently resolved - Likely related to pleural effusion - Pneumonia cannot be excluded on chest x-ray but no clinical symptoms - Patient has recently been on Levaquin and Omnicef plus doxycycline - Discontinue further cefepime - Pain medications could also be contributing to this hypoxia - Wean off as tolerated - Continue to monitor   Essential hypertension - Continue amlodipine   Obesity -BMI 31.79   DVT prophylaxis: SCDs Code Status: Full Family Communication: None at bedside Disposition Plan:  Status is: Inpatient Remains inpatient appropriate because: Need for IV medications.   Consultants:  GI   Procedures:  None   Antimicrobials:  Anti-infectives (From admission, onward)    Start     Dose/Rate Route Frequency Ordered Stop   07/01/22 0600  metroNIDAZOLE (FLAGYL) IVPB 500 mg  Status:  Discontinued        500 mg 100 mL/hr over 60 Minutes Intravenous Every 12 hours 07/01/22 0314 07/04/22 1110   06/30/22 1830  ceFEPIme (MAXIPIME) 2 g in sodium chloride 0.9 % 100 mL IVPB  Status:  Discontinued        2 g 200 mL/hr over 30 Minutes Intravenous Every 8 hours 06/30/22 1815 07/04/22 1110   06/30/22 1815  ceFEPIme (MAXIPIME) 2 g in sodium chloride 0.9 % 100 mL IVPB  Status:  Discontinued        2 g 200 mL/hr over 30 Minutes Intravenous  Once 06/30/22 1802 06/30/22 1815   06/30/22  1815  metroNIDAZOLE (FLAGYL) IVPB 500 mg        500 mg 100 mL/hr over 60 Minutes Intravenous  Once 06/30/22 1802 06/30/22 1956       Subjective: Patient seen and evaluated today with ongoing severe abdominal pain and inability to tolerate diet.  NG tube successfully placed by general surgery today.  Objective: Vitals:   07/06/22 1233 07/06/22 2109 07/07/22 0411 07/07/22 0833  BP: 128/83 133/88 113/76  129/82  Pulse: 95 96 87 90  Resp: '20 16 18 18  '$ Temp: 98.8 F (37.1 C) 98.3 F (36.8 C) 98.7 F (37.1 C) 97.6 F (36.4 C)  TempSrc:      SpO2: 96% 94% 93% 94%  Weight:      Height:        Intake/Output Summary (Last 24 hours) at 07/07/2022 1033 Last data filed at 07/06/2022 1700 Gross per 24 hour  Intake 684.03 ml  Output 2000 ml  Net -1315.97 ml   Filed Weights   06/30/22 1722 07/02/22 0436 07/03/22 2150  Weight: 65.8 kg 75 kg 81.4 kg    Examination:  General exam: Appears calm and comfortable  Respiratory system: Clear to auscultation. Respiratory effort normal. Cardiovascular system: S1 & S2 heard, RRR.  Gastrointestinal system: Abdomen is soft Central nervous system: Alert and awake Extremities: No edema Skin: No significant lesions noted Psychiatry: Flat affect.    Data Reviewed: I have personally reviewed following labs and imaging studies  CBC: Recent Labs  Lab 07/02/22 0338 07/03/22 0429 07/04/22 0423 07/05/22 0317 07/06/22 0353 07/07/22 0344  WBC 7.9 6.9 6.3 6.9 7.2 6.7  NEUTROABS 5.3 4.2 3.7 4.0 3.8  --   HGB 9.7* 10.2* 9.7* 9.8* 10.1* 11.0*  HCT 30.6* 32.5* 30.4* 30.9* 33.4* 35.4*  MCV 96.2 98.5 96.5 96.3 97.9 95.7  PLT 423* 389 362 359 382 998   Basic Metabolic Panel: Recent Labs  Lab 07/03/22 0429 07/04/22 0423 07/05/22 0317 07/06/22 0353 07/07/22 0344  NA 136 137 137 137 136  K 3.2* 3.9 4.4 5.2* 4.0  CL 104 105 103 99 98  CO2 '28 28 30 31 31  '$ GLUCOSE 66* 77 65* 66* 80  BUN 7 <5* <5* <5* <5*  CREATININE 0.30* 0.32* 0.36* 0.34* 0.36*  CALCIUM 8.3* 8.3* 8.4* 8.6* 8.6*  MG 1.5* 1.7 1.5* 1.6* 1.7   GFR: Estimated Creatinine Clearance: 97.3 mL/min (A) (by C-G formula based on SCr of 0.36 mg/dL (L)). Liver Function Tests: Recent Labs  Lab 07/03/22 0429 07/04/22 0423 07/05/22 0317 07/06/22 0353 07/07/22 0344  AST 12* 12* 14* 13* 14*  ALT '7 6 7 6 7  '$ ALKPHOS 65 60 63 63 62  BILITOT 0.6 0.7 0.7 0.8 0.4  PROT 6.0* 5.9* 6.0* 6.2*  6.7  ALBUMIN 2.3* 2.1* 2.2* 2.2* 2.3*   Recent Labs  Lab 06/30/22 1754 07/01/22 0609 07/02/22 0338 07/03/22 0429 07/04/22 0423  LIPASE 108* 130* 64* 80* 90*   No results for input(s): "AMMONIA" in the last 168 hours. Coagulation Profile: Recent Labs  Lab 06/30/22 1754  INR 1.1   Cardiac Enzymes: No results for input(s): "CKTOTAL", "CKMB", "CKMBINDEX", "TROPONINI" in the last 168 hours. BNP (last 3 results) No results for input(s): "PROBNP" in the last 8760 hours. HbA1C: No results for input(s): "HGBA1C" in the last 72 hours. CBG: Recent Labs  Lab 07/06/22 0005 07/06/22 0721 07/06/22 1615 07/07/22 0008 07/07/22 0758  GLUCAP 82 85 66* 98 154*   Lipid Profile: No results for input(s): "CHOL", "HDL", "LDLCALC", "  TRIG", "CHOLHDL", "LDLDIRECT" in the last 72 hours. Thyroid Function Tests: No results for input(s): "TSH", "T4TOTAL", "FREET4", "T3FREE", "THYROIDAB" in the last 72 hours. Anemia Panel: No results for input(s): "VITAMINB12", "FOLATE", "FERRITIN", "TIBC", "IRON", "RETICCTPCT" in the last 72 hours. Sepsis Labs: Recent Labs  Lab 06/30/22 1754 06/30/22 2017  LATICACIDVEN 1.0 0.9    Recent Results (from the past 240 hour(s))  Resp Panel by RT-PCR (Flu A&B, Covid) Anterior Nasal Swab     Status: None   Collection Time: 06/30/22  6:02 PM   Specimen: Anterior Nasal Swab  Result Value Ref Range Status   SARS Coronavirus 2 by RT PCR NEGATIVE NEGATIVE Final    Comment: (NOTE) SARS-CoV-2 target nucleic acids are NOT DETECTED.  The SARS-CoV-2 RNA is generally detectable in upper respiratory specimens during the acute phase of infection. The lowest concentration of SARS-CoV-2 viral copies this assay can detect is 138 copies/mL. A negative result does not preclude SARS-Cov-2 infection and should not be used as the sole basis for treatment or other patient management decisions. A negative result may occur with  improper specimen collection/handling, submission of  specimen other than nasopharyngeal swab, presence of viral mutation(s) within the areas targeted by this assay, and inadequate number of viral copies(<138 copies/mL). A negative result must be combined with clinical observations, patient history, and epidemiological information. The expected result is Negative.  Fact Sheet for Patients:  EntrepreneurPulse.com.au  Fact Sheet for Healthcare Providers:  IncredibleEmployment.be  This test is no t yet approved or cleared by the [place] FDA and  has been authorized for detection and/or diagnosis of SARS-CoV-2 by FDA under an Emergency Use Authorization (EUA). This EUA will remain  in effect (meaning this test can be used) for the duration of the COVID-19 declaration under Section 564(b)(1) of the Act, 21 U.S.C.section 360bbb-3(b)(1), unless the authorization is terminated  or revoked sooner.       Influenza A by PCR NEGATIVE NEGATIVE Final   Influenza B by PCR NEGATIVE NEGATIVE Final    Comment: (NOTE) The Xpert Xpress SARS-CoV-2/FLU/RSV plus assay is intended as an aid in the diagnosis of influenza from Nasopharyngeal swab specimens and should not be used as a sole basis for treatment. Nasal washings and aspirates are unacceptable for Xpert Xpress SARS-CoV-2/FLU/RSV testing.  Fact Sheet for Patients: EntrepreneurPulse.com.au  Fact Sheet for Healthcare Providers: IncredibleEmployment.be  This test is not yet approved or cleared by the [place] FDA and has been authorized for detection and/or diagnosis of SARS-CoV-2 by FDA under an Emergency Use Authorization (EUA). This EUA will remain in effect (meaning this test can be used) for the duration of the COVID-19 declaration under Section 564(b)(1) of the Act, 21 U.S.C. section 360bbb-3(b)(1), unless the authorization is terminated or revoked.  Performed at [facility], [address]  [place], [place] [address]   Blood culture (routine x 2)     Status: None   Collection Time: 06/30/22  6:13 PM   Specimen: BLOOD LEFT FOREARM  Result Value Ref Range Status   Specimen Description   Final    BLOOD LEFT FOREARM BOTTLES DRAWN AEROBIC AND ANAEROBIC   Special Requests Blood Culture adequate volume  Final   Culture   Final    NO GROWTH 5 DAYS Performed at [facility], [address] [place], [place] [address]    Report Status 07/05/2022 FINAL  Final  Blood culture (routine x 2)     Status: None   Collection Time: 06/30/22  6:29 PM  Specimen: Right Antecubital; Blood  Result Value Ref Range Status   Specimen Description   Final    RIGHT ANTECUBITAL BOTTLES DRAWN AEROBIC AND ANAEROBIC   Special Requests Blood Culture adequate volume  Final   Culture   Final    NO GROWTH 5 DAYS Performed at [facility], [address] [place], [place] [address]    Report Status 07/05/2022 FINAL  Final  Urine Culture     Status: None   Collection Time: 06/30/22  9:21 PM   Specimen: Urine, Clean Catch  Result Value Ref Range Status   Specimen Description   Final    URINE, CLEAN CATCH Performed at [facility], [address] [place], [place] [address]    Special Requests   Final    NONE Performed at [facility], [address] [place], [place] [address]    Culture   Final    NO GROWTH Performed at [facility], [place] [address], [place], [place] [address]    Report Status 07/02/2022 FINAL  Final  MRSA Next Gen by PCR, Nasal     Status: None   Collection Time: 07/01/22  3:09 AM   Specimen: Nasal Mucosa; Nasal Swab  Result Value Ref Range Status   MRSA by PCR Next Gen NOT DETECTED NOT DETECTED Final    Comment: (NOTE) The GeneXpert MRSA Assay (FDA approved for NASAL specimens only), is one component of a comprehensive MRSA colonization surveillance program. It is not intended to diagnose MRSA infection nor to guide or monitor treatment for MRSA infections. Test performance  is not FDA approved in patients less than 6 years old. Performed at [facility], [address], [place], [place] [address]          Radiology Studies: [facility] 1 View  Result Date: 07/07/2022 CLINICAL DATA:  Assess for nasogastric tube placement EXAM: PORTABLE CHEST 1 VIEW COMPARISON:  June 30, 2022 FINDINGS: The heart size and mediastinal contours are stable. Patchy consolidation of left lung base with left pleural effusion noted. Mild patchy consolidation of medial right lung base is noted. Nasogastric tube is identified with distal tip in the distal stomach. The visualized skeletal structures are stable. IMPRESSION: Nasogastric tube with distal tip in the distal stomach. Patchy consolidation of left lung base with left pleural effusion noted. Mild patchy consolidation of medial right lung base is noted. Electronically Signed   By: Abelardo Diesel M.D.   On: 07/07/2022 09:19        Scheduled Meds:  amLODipine  10 mg Oral Daily   Chlorhexidine Gluconate Cloth  6 each Topical Daily   docusate sodium  200 mg Oral Daily   folic acid  1 mg Oral Daily   pantoprazole (PROTONIX) IV  40 mg Intravenous Q24H   Continuous Infusions:  dextrose 5% lactated ringers 75 mL/hr at 07/07/22 0619     LOS: 7 days    Time spent: 35 minutes    Colden Samaras Darleen Crocker, DO [facility]  If 7PM-7AM, please contact night-coverage [email or web address] 07/07/2022, 10:33 AM

## 2022-07-08 ENCOUNTER — Inpatient Hospital Stay (HOSPITAL_COMMUNITY): Payer: 59

## 2022-07-08 DIAGNOSIS — K859 Acute pancreatitis without necrosis or infection, unspecified: Secondary | ICD-10-CM | POA: Diagnosis not present

## 2022-07-08 DIAGNOSIS — K861 Other chronic pancreatitis: Secondary | ICD-10-CM | POA: Diagnosis not present

## 2022-07-08 DIAGNOSIS — R1013 Epigastric pain: Secondary | ICD-10-CM

## 2022-07-08 LAB — GLUCOSE, CAPILLARY
Glucose-Capillary: 133 mg/dL — ABNORMAL HIGH (ref 70–99)
Glucose-Capillary: 94 mg/dL (ref 70–99)
Glucose-Capillary: 97 mg/dL (ref 70–99)

## 2022-07-08 LAB — MAGNESIUM: Magnesium: 1.7 mg/dL (ref 1.7–2.4)

## 2022-07-08 LAB — COMPREHENSIVE METABOLIC PANEL
ALT: 11 U/L (ref 0–44)
AST: 23 U/L (ref 15–41)
Albumin: 2.4 g/dL — ABNORMAL LOW (ref 3.5–5.0)
Alkaline Phosphatase: 59 U/L (ref 38–126)
Anion gap: 7 (ref 5–15)
BUN: <5 mg/dL — ABNORMAL LOW (ref 6–20)
CO2: 27 mmol/L (ref 22–32)
Calcium: 8.5 mg/dL — ABNORMAL LOW (ref 8.9–10.3)
Chloride: 102 mmol/L (ref 98–111)
Creatinine, Ser: 0.43 mg/dL — ABNORMAL LOW (ref 0.44–1.00)
GFR, Estimated: >60 mL/min (ref >60)
Glucose, Bld: 92 mg/dL (ref 70–99)
Potassium: 4.8 mmol/L (ref 3.5–5.1)
Sodium: 136 mmol/L (ref 135–145)
Total Bilirubin: 0.8 mg/dL (ref 0.3–1.2)
Total Protein: 6.8 g/dL (ref 6.5–8.1)

## 2022-07-08 LAB — CBC
HCT: 35.6 % — ABNORMAL LOW (ref 36.0–46.0)
Hemoglobin: 11 g/dL — ABNORMAL LOW (ref 12.0–15.0)
MCH: 29.6 pg (ref 26.0–34.0)
MCHC: 30.9 g/dL (ref 30.0–36.0)
MCV: 96 fL (ref 80.0–100.0)
Platelets: 258 10*3/uL (ref 150–400)
RBC: 3.71 MIL/uL — ABNORMAL LOW (ref 3.87–5.11)
RDW: 17.7 % — ABNORMAL HIGH (ref 11.5–15.5)
WBC: 6.5 10*3/uL (ref 4.0–10.5)
nRBC: 0 % (ref 0.0–0.2)

## 2022-07-08 MED ORDER — FOLIC ACID 1 MG PO TABS
1.0000 mg | ORAL_TABLET | Freq: Every day | ORAL | Status: DC
  Administered 2022-07-09 – 2022-07-12 (×4): 1 mg
  Filled 2022-07-08 (×4): qty 1

## 2022-07-08 MED ORDER — DOCUSATE SODIUM 50 MG/5ML PO LIQD
200.0000 mg | Freq: Every day | ORAL | Status: DC
  Administered 2022-07-08 – 2022-07-09 (×2): 200 mg
  Filled 2022-07-08 (×10): qty 20

## 2022-07-08 MED ORDER — IOHEXOL 300 MG/ML  SOLN
100.0000 mL | Freq: Once | INTRAMUSCULAR | Status: AC | PRN
  Administered 2022-07-08: 100 mL via INTRAVENOUS

## 2022-07-08 MED ORDER — MECLIZINE HCL 12.5 MG PO TABS
25.0000 mg | ORAL_TABLET | Freq: Three times a day (TID) | ORAL | Status: DC | PRN
  Administered 2022-07-09 – 2022-07-13 (×2): 25 mg
  Filled 2022-07-08: qty 2

## 2022-07-08 MED ORDER — ONDANSETRON HCL 4 MG PO TABS: 4.0000 mg | ORAL_TABLET | Freq: Four times a day (QID) | ORAL | Status: DC | PRN

## 2022-07-08 MED ORDER — ONDANSETRON HCL 4 MG/2ML IJ SOLN
4.0000 mg | Freq: Four times a day (QID) | INTRAMUSCULAR | Status: DC | PRN
  Administered 2022-07-08 – 2022-07-12 (×4): 4 mg via INTRAVENOUS
  Filled 2022-07-08 (×4): qty 2

## 2022-07-08 MED ORDER — IBUPROFEN 400 MG PO TABS: 400.0000 mg | ORAL_TABLET | Freq: Four times a day (QID) | ORAL | Status: DC | PRN

## 2022-07-08 MED ORDER — AMLODIPINE BESYLATE 5 MG PO TABS
10.0000 mg | ORAL_TABLET | Freq: Every day | ORAL | Status: DC
  Administered 2022-07-09 – 2022-07-13 (×4): 10 mg
  Filled 2022-07-08 (×5): qty 2

## 2022-07-08 NOTE — Progress Notes (Signed)
Subjective: Patient states she got nauseous with tube feeds yesterday.  Some abdominal pain.  No vomiting.  Objective: Vital signs in last 24 hours: Temp:  [97.1 F (36.2 C)-98.1 F (36.7 C)] 97.1 F (36.2 C) (12/03 0334) Pulse Rate:  [73-89] 77 (12/03 0800) Resp:  [14-16] 16 (12/03 0334) BP: (111-126)/(79-87) 113/82 (12/03 0800) SpO2:  [87 %-94 %] 92 % (12/03 0334) Last BM Date : 07/01/22 General:   Alert and oriented, pleasant Head:  Normocephalic and atraumatic. Eyes:  No icterus, sclera clear. Conjuctiva pink.  Abdomen:  Bowel sounds present, soft, non-tender, non-distended. No HSM or hernias noted. No rebound or guarding. No masses appreciated  Msk:  Symmetrical without gross deformities. Normal posture. Extremities:  Without clubbing or edema. Neurologic:  Alert and  oriented x4;  grossly normal neurologically. Skin:  Warm and dry, intact without significant lesions.  Cervical Nodes:  No significant cervical adenopathy. Psych:  Alert and cooperative. Normal mood and affect.  Intake/Output from previous day: 12/02 0701 - 12/03 0700 In: 3179.8 [I.V.:2702.8; NG/GT:477] Out: -  Intake/Output this shift: No intake/output data recorded.  Lab Results: Recent Labs    07/06/22 0353 07/07/22 0344 07/08/22 0447  WBC 7.2 6.7 6.5  HGB 10.1* 11.0* 11.0*  HCT 33.4* 35.4* 35.6*  PLT 382 400 258    BMET Recent Labs    07/06/22 0353 07/07/22 0344 07/08/22 0447  NA 137 136 136  K 5.2* 4.0 4.8  CL 99 98 102  CO2 '31 31 27  '$ GLUCOSE 66* 80 92  BUN <5* <5* <5*  CREATININE 0.34* 0.36* 0.43*  CALCIUM 8.6* 8.6* 8.5*    LFT Recent Labs    07/06/22 0353 07/07/22 0344 07/08/22 0447  PROT 6.2* 6.7 6.8  ALBUMIN 2.2* 2.3* 2.4*  AST 13* 14* 23  ALT '6 7 11  '$ ALKPHOS 63 62 59  BILITOT 0.8 0.4 0.8    PT/INR No results for input(s): "LABPROT", "INR" in the last 72 hours. Hepatitis Panel No results for input(s): "HEPBSAG", "HCVAB", "HEPAIGM", "HEPBIGM" in the last 72  hours.   Studies/Results: CT ABDOMEN PELVIS W CONTRAST  Result Date: 07/08/2022 CLINICAL DATA:  Follow-up acute pancreatitis and splenic hematoma. EXAM: CT ABDOMEN AND PELVIS WITH CONTRAST TECHNIQUE: Multidetector CT imaging of the abdomen and pelvis was performed using the standard protocol following bolus administration of intravenous contrast. RADIATION DOSE REDUCTION: This exam was performed according to the departmental dose-optimization program which includes automated exposure control, adjustment of the mA and/or kV according to patient size and/or use of iterative reconstruction technique. CONTRAST:  122m OMNIPAQUE IOHEXOL 300 MG/ML  SOLN COMPARISON:  06/30/2022 FINDINGS: Lower Chest: Moderate left pleural effusion and left lower lobe atelectasis show mild increase since previous study. Hepatobiliary: No hepatic masses identified. Nonocclusive thrombus is seen in the main and right portal veins. Gallbladder is unremarkable. No evidence of biliary ductal dilatation. Pancreas: Moderate acute pancreatitis shows no significant change. A 2.2 x 1.9 cm fluid collection in the splenic tail is also unchanged and consistent with a small pseudocyst. Spleen: Large subcapsular splenic hematoma measures 14.3 x 10.5 cm, without significant change in size compared to prior study. Splenic vein thrombosis also noted with venous collateral seen in the gastrosplenic ligament. Adrenals/Urinary Tract: No suspicious masses identified. No evidence of ureteral calculi or hydronephrosis. Stomach/Bowel: Nasogastric tube tip is seen in the distal gastric body. No evidence of obstruction, inflammatory process or abnormal fluid collections. Vascular/Lymphatic: No pathologically enlarged lymph nodes. Nonocclusive thrombus in the main and right portal  veins and splenic vein thrombosis again noted, as described above. Reproductive:  No mass or other significant abnormality. Other:  None. Musculoskeletal:  No suspicious bone lesions  identified. IMPRESSION: No significant change in moderate acute pancreatitis. Stable 2.2 cm pseudocyst in splenic tail. No significant change in large subcapsular splenic hematoma. Nonocclusive thrombus in the main and right portal veins. Splenic vein thrombosis. Increased moderate left pleural effusion and left lower lobe atelectasis. Electronically Signed   By: Marlaine Hind M.D.   On: 07/08/2022 10:02   DG Chest Port 1 View  Result Date: 07/07/2022 CLINICAL DATA:  Assess for nasogastric tube placement EXAM: PORTABLE CHEST 1 VIEW COMPARISON:  June 30, 2022 FINDINGS: The heart size and mediastinal contours are stable. Patchy consolidation of left lung base with left pleural effusion noted. Mild patchy consolidation of medial right lung base is noted. Nasogastric tube is identified with distal tip in the distal stomach. The visualized skeletal structures are stable. IMPRESSION: Nasogastric tube with distal tip in the distal stomach. Patchy consolidation of left lung base with left pleural effusion noted. Mild patchy consolidation of medial right lung base is noted. Electronically Signed   By: Abelardo Diesel M.D.   On: 07/07/2022 09:19    Assessment: *Acute on chronic pancreatitis complicated by pseudocyst formation *?  Necrotizing pancreatitis *Malnutrition and weight loss  Plan: NG tube placed this morning by Dr. Arnoldo Morale 12/2.  Tube feeds stopped due to nausea, abdominal pain.  Repeat CT imaging today largely unchanged.  A lot of patient's pain may be due to large subcapsular splenic hematoma.  Discussed with patient possibility of post pyloric extension of her tube by IR.  She wishes to try tube feeds again today at lower rate before considering this.  Will trial again on tube feeds, start at 10 mL/hr and see how she does.  Continue PPI.  Antiemetics as needed.  Continue bowel regimen.  Otherwise supportive care.  Elon Alas. Abbey Chatters, D.O. Gastroenterology and Hepatology [facility]  [facility]   LOS: 8 days    07/08/2022, 11:23 AM

## 2022-07-08 NOTE — Progress Notes (Signed)
PROGRESS NOTE    Jillian Shepherd  GQQ:[id number] DOB: 1986-07-03 DOA: 06/30/2022 PCP: [name], MD   Brief Narrative:    36 y.o. female with medical history significant of alcohol abuse, B12 deficiency, hypokalemia, Liddle syndrome, history of splenic hemorrhage with splenic subcapsular hematoma, presents to the ED with a chief complaint of abdominal pain.  She was readmitted with acute on chronic pancreatitis in the setting of possible alcohol use.      Assessment & Plan:   Principal Problem:   Acute on chronic pancreatitis Active Problems:   Essential hypertension   Hypokalemia   Acute hypoxic respiratory failure (HCC)   Malnutrition of moderate degree   Diabetes mellitus type II, controlled ([place])   Dehydration  Assessment and Plan:   Acute on chronic pancreatitis - Pt presented with abdominal pain with leukocytosis, thrombocytosis, CT scan showing acute pancreatitis with interval decrease in size of the distal pancreatic body cystic lesion that was likely a pseudocyst.  Component of necrotizing pancreatitis cannot be excluded.  Perisplenic subcapsular hematoma still present-hold anticoagulation - Patient finished Levaquin on November 12 - Discontinue cefepime and Flagyl - Last lipid panel shows triglycerides of 115 - No new medications that could have contributed to this acute flare - Previously thought to be alcoholic pancreatitis, but patient has not had a drink in 1 month per her report - Pt reports pain not controlled, changed to dilaudid 11/27 with better control.  Continue clear liquid diet. Appreciate GI team recommendations. -NG tube placed by general surgery 12/2 and tube feedings will be initiated with appropriate positioning noted on KUB; try trickle feeds  -CT abdomen and pelvis performed on 12/3 with no significant new changes -May consider Reglan if persistent nausea noted despite slow tube feedings   Diabetes mellitus type II, controlled ([name]) - Hold  metformin - Sliding scale coverage - if BS less than 70 would add dextrose to IV fluid, continue to monitor - CLD     Malnutrition of moderate degree - Hypoalbuminemia at 3.2 - Patient has been on the liquid only diet for a month per her report - only able to tolerate clear liquids at this time.  - obtain nutrition team consult   Acute hypoxic respiratory failure-currently resolved - Likely related to pleural effusion - Pneumonia cannot be excluded on chest x-ray but no clinical symptoms - Patient has recently been on Levaquin and Omnicef plus doxycycline - Discontinue further cefepime - Pain medications could also be contributing to this hypoxia - Wean off as tolerated - Continue to monitor   Essential hypertension - Continue amlodipine   Obesity -BMI 31.79   DVT prophylaxis: SCDs Code Status: Full Family Communication: None at bedside Disposition Plan:  Status is: Inpatient Remains inpatient appropriate because: Need for IV medications.   Consultants:  GI   Procedures:  None   Antimicrobials:  Anti-infectives (From admission, onward)    Start     Dose/Rate Route Frequency Ordered Stop   07/01/22 0600  metroNIDAZOLE (FLAGYL) IVPB 500 mg  Status:  Discontinued        500 mg 100 mL/hr over 60 Minutes Intravenous Every 12 hours 07/01/22 0314 07/04/22 1110   06/30/22 1830  ceFEPIme (MAXIPIME) 2 g in sodium chloride 0.9 % 100 mL IVPB  Status:  Discontinued        2 g 200 mL/hr over 30 Minutes Intravenous Every 8 hours 06/30/22 1815 07/04/22 1110   06/30/22 1815  ceFEPIme (MAXIPIME) 2 g in sodium chloride 0.9 %  100 mL IVPB  Status:  Discontinued        2 g 200 mL/hr over 30 Minutes Intravenous  Once 06/30/22 1802 06/30/22 1815   06/30/22 1815  metroNIDAZOLE (FLAGYL) IVPB 500 mg        500 mg 100 mL/hr over 60 Minutes Intravenous  Once 06/30/22 1802 06/30/22 1956      Subjective: Patient seen and evaluated today with ongoing abdominal pain and was reported to  have worsening nausea with start of tube feeds yesterday.  Objective: Vitals:   07/07/22 1459 07/07/22 2042 07/08/22 0334 07/08/22 0800  BP: 111/84 126/87 125/79 113/82  Pulse: 89 83 73 77  Resp: '14 16 16   '$ Temp: 98.1 F (36.7 C) 98.1 F (36.7 C) (!) 97.1 F (36.2 C)   TempSrc:  Oral    SpO2: (!) 87% 94% 92%   Weight:      Height:        Intake/Output Summary (Last 24 hours) at 07/08/2022 1111 Last data filed at 07/08/2022 0900 Gross per 24 hour  Intake 3179.8 ml  Output --  Net 3179.8 ml   Filed Weights   06/30/22 1722 07/02/22 0436 07/03/22 2150  Weight: 65.8 kg 75 kg 81.4 kg    Examination:  General exam: Appears calm and comfortable  Respiratory system: Clear to auscultation. Respiratory effort normal. Cardiovascular system: S1 & S2 heard, RRR.  Gastrointestinal system: Abdomen is soft Central nervous system: Alert and awake Extremities: No edema Skin: No significant lesions noted Psychiatry: Flat affect.    Data Reviewed: I have personally reviewed following labs and imaging studies  CBC: Recent Labs  Lab 07/02/22 0338 07/03/22 0429 07/04/22 0423 07/05/22 0317 07/06/22 0353 07/07/22 0344 07/08/22 0447  WBC 7.9 6.9 6.3 6.9 7.2 6.7 6.5  NEUTROABS 5.3 4.2 3.7 4.0 3.8  --   --   HGB 9.7* 10.2* 9.7* 9.8* 10.1* 11.0* 11.0*  HCT 30.6* 32.5* 30.4* 30.9* 33.4* 35.4* 35.6*  MCV 96.2 98.5 96.5 96.3 97.9 95.7 96.0  PLT 423* 389 362 359 382 400 007   Basic Metabolic Panel: Recent Labs  Lab 07/04/22 0423 07/05/22 0317 07/06/22 0353 07/07/22 0344 07/08/22 0447  NA 137 137 137 136 136  K 3.9 4.4 5.2* 4.0 4.8  CL 105 103 99 98 102  CO2 '28 30 31 31 27  '$ GLUCOSE 77 65* 66* 80 92  BUN <5* <5* <5* <5* <5*  CREATININE 0.32* 0.36* 0.34* 0.36* 0.43*  CALCIUM 8.3* 8.4* 8.6* 8.6* 8.5*  MG 1.7 1.5* 1.6* 1.7 1.7   GFR: Estimated Creatinine Clearance: 97.3 mL/min (A) (by C-G formula based on SCr of 0.43 mg/dL (L)). Liver Function Tests: Recent Labs  Lab  07/04/22 0423 07/05/22 0317 07/06/22 0353 07/07/22 0344 07/08/22 0447  AST 12* 14* 13* 14* 23  ALT '6 7 6 7 11  '$ ALKPHOS 60 63 63 62 59  BILITOT 0.7 0.7 0.8 0.4 0.8  PROT 5.9* 6.0* 6.2* 6.7 6.8  ALBUMIN 2.1* 2.2* 2.2* 2.3* 2.4*   Recent Labs  Lab 07/02/22 0338 07/03/22 0429 07/04/22 0423  LIPASE 64* 80* 90*   No results for input(s): "AMMONIA" in the last 168 hours. Coagulation Profile: No results for input(s): "INR", "PROTIME" in the last 168 hours. Cardiac Enzymes: No results for input(s): "CKTOTAL", "CKMB", "CKMBINDEX", "TROPONINI" in the last 168 hours. BNP (last 3 results) No results for input(s): "PROBNP" in the last 8760 hours. HbA1C: No results for input(s): "HGBA1C" in the last 72 hours. CBG: Recent Labs  Lab 07/07/22 0008 07/07/22 0758 07/07/22 1504 07/07/22 2356 07/08/22 0820  GLUCAP 98 154* 144* 101* 94   Lipid Profile: No results for input(s): "CHOL", "HDL", "LDLCALC", "TRIG", "CHOLHDL", "LDLDIRECT" in the last 72 hours. Thyroid Function Tests: No results for input(s): "TSH", "T4TOTAL", "FREET4", "T3FREE", "THYROIDAB" in the last 72 hours. Anemia Panel: No results for input(s): "VITAMINB12", "FOLATE", "FERRITIN", "TIBC", "IRON", "RETICCTPCT" in the last 72 hours. Sepsis Labs: No results for input(s): "PROCALCITON", "LATICACIDVEN" in the last 168 hours.  Recent Results (from the past 240 hour(s))  Resp Panel by RT-PCR (Flu A&B, Covid) Anterior Nasal Swab     Status: None   Collection Time: 06/30/22  6:02 PM   Specimen: Anterior Nasal Swab  Result Value Ref Range Status   SARS Coronavirus 2 by RT PCR NEGATIVE NEGATIVE Final    Comment: (NOTE) SARS-CoV-2 target nucleic acids are NOT DETECTED.  The SARS-CoV-2 RNA is generally detectable in upper respiratory specimens during the acute phase of infection. The lowest concentration of SARS-CoV-2 viral copies this assay can detect is 138 copies/mL. A negative result does not preclude SARS-Cov-2 infection  and should not be used as the sole basis for treatment or other patient management decisions. A negative result may occur with  improper specimen collection/handling, submission of specimen other than nasopharyngeal swab, presence of viral mutation(s) within the areas targeted by this assay, and inadequate number of viral copies(<138 copies/mL). A negative result must be combined with clinical observations, patient history, and epidemiological information. The expected result is Negative.  Fact Sheet for Patients:  EntrepreneurPulse.com.au  Fact Sheet for Healthcare Providers:  IncredibleEmployment.be  This test is no t yet approved or cleared by the [place] FDA and  has been authorized for detection and/or diagnosis of SARS-CoV-2 by FDA under an Emergency Use Authorization (EUA). This EUA will remain  in effect (meaning this test can be used) for the duration of the COVID-19 declaration under Section 564(b)(1) of the Act, 21 U.S.C.section 360bbb-3(b)(1), unless the authorization is terminated  or revoked sooner.       Influenza A by PCR NEGATIVE NEGATIVE Final   Influenza B by PCR NEGATIVE NEGATIVE Final    Comment: (NOTE) The Xpert Xpress SARS-CoV-2/FLU/RSV plus assay is intended as an aid in the diagnosis of influenza from Nasopharyngeal swab specimens and should not be used as a sole basis for treatment. Nasal washings and aspirates are unacceptable for Xpert Xpress SARS-CoV-2/FLU/RSV testing.  Fact Sheet for Patients: EntrepreneurPulse.com.au  Fact Sheet for Healthcare Providers: IncredibleEmployment.be  This test is not yet approved or cleared by the [place] FDA and has been authorized for detection and/or diagnosis of SARS-CoV-2 by FDA under an Emergency Use Authorization (EUA). This EUA will remain in effect (meaning this test can be used) for the duration of the COVID-19 declaration  under Section 564(b)(1) of the Act, 21 U.S.C. section 360bbb-3(b)(1), unless the authorization is terminated or revoked.  Performed at [facility], [address], [place], [place] [address]   Blood culture (routine x 2)     Status: None   Collection Time: 06/30/22  6:13 PM   Specimen: BLOOD LEFT FOREARM  Result Value Ref Range Status   Specimen Description   Final    BLOOD LEFT FOREARM BOTTLES DRAWN AEROBIC AND ANAEROBIC   Special Requests Blood Culture adequate volume  Final   Culture   Final    NO GROWTH 5 DAYS Performed at [facility], [address] [place], [place] [address]  Report Status 07/05/2022 FINAL  Final  Blood culture (routine x 2)     Status: None   Collection Time: 06/30/22  6:29 PM   Specimen: Right Antecubital; Blood  Result Value Ref Range Status   Specimen Description   Final    RIGHT ANTECUBITAL BOTTLES DRAWN AEROBIC AND ANAEROBIC   Special Requests Blood Culture adequate volume  Final   Culture   Final    NO GROWTH 5 DAYS Performed at [facility], [address], [place], [place] [address]    Report Status 07/05/2022 FINAL  Final  Urine Culture     Status: None   Collection Time: 06/30/22  9:21 PM   Specimen: Urine, Clean Catch  Result Value Ref Range Status   Specimen Description   Final    URINE, CLEAN CATCH Performed at [facility], [address] [place], [place] [address]    Special Requests   Final    NONE Performed at [facility], [address], [place], [place] [address]    Culture   Final    NO GROWTH Performed at [facility], [place] [address] [place], [place] [address]    Report Status 07/02/2022 FINAL  Final  MRSA Next Gen by PCR, Nasal     Status: None   Collection Time: 07/01/22  3:09 AM   Specimen: Nasal Mucosa; Nasal Swab  Result Value Ref Range Status   MRSA by PCR Next Gen NOT DETECTED NOT DETECTED Final    Comment: (NOTE) The GeneXpert MRSA Assay (FDA approved for NASAL specimens only), is one component of a  comprehensive MRSA colonization surveillance program. It is not intended to diagnose MRSA infection nor to guide or monitor treatment for MRSA infections. Test performance is not FDA approved in patients less than 66 years old. Performed at [facility] [address], [place], [place] [address]          Radiology Studies: CT ABDOMEN PELVIS W CONTRAST  Result Date: 07/08/2022 CLINICAL DATA:  Follow-up acute pancreatitis and splenic hematoma. EXAM: CT ABDOMEN AND PELVIS WITH CONTRAST TECHNIQUE: Multidetector CT imaging of the abdomen and pelvis was performed using the standard protocol following bolus administration of intravenous contrast. RADIATION DOSE REDUCTION: This exam was performed according to the departmental dose-optimization program which includes automated exposure control, adjustment of the mA and/or kV according to patient size and/or use of iterative reconstruction technique. CONTRAST:  174m OMNIPAQUE IOHEXOL 300 MG/ML  SOLN COMPARISON:  06/30/2022 FINDINGS: Lower Chest: Moderate left pleural effusion and left lower lobe atelectasis show mild increase since previous study. Hepatobiliary: No hepatic masses identified. Nonocclusive thrombus is seen in the main and right portal veins. Gallbladder is unremarkable. No evidence of biliary ductal dilatation. Pancreas: Moderate acute pancreatitis shows no significant change. A 2.2 x 1.9 cm fluid collection in the splenic tail is also unchanged and consistent with a small pseudocyst. Spleen: Large subcapsular splenic hematoma measures 14.3 x 10.5 cm, without significant change in size compared to prior study. Splenic vein thrombosis also noted with venous collateral seen in the gastrosplenic ligament. Adrenals/Urinary Tract: No suspicious masses identified. No evidence of ureteral calculi or hydronephrosis. Stomach/Bowel: Nasogastric tube tip is seen in the distal gastric body. No evidence of obstruction, inflammatory process or abnormal fluid  collections. Vascular/Lymphatic: No pathologically enlarged lymph nodes. Nonocclusive thrombus in the main and right portal veins and splenic vein thrombosis again noted, as described above. Reproductive:  No mass or other significant abnormality. Other:  None. Musculoskeletal:  No  suspicious bone lesions identified. IMPRESSION: No significant change in moderate acute pancreatitis. Stable 2.2 cm pseudocyst in splenic tail. No significant change in large subcapsular splenic hematoma. Nonocclusive thrombus in the main and right portal veins. Splenic vein thrombosis. Increased moderate left pleural effusion and left lower lobe atelectasis. Electronically Signed   By: Marlaine Hind M.D.   On: 07/08/2022 10:02   DG Chest Port 1 View  Result Date: 07/07/2022 CLINICAL DATA:  Assess for nasogastric tube placement EXAM: PORTABLE CHEST 1 VIEW COMPARISON:  June 30, 2022 FINDINGS: The heart size and mediastinal contours are stable. Patchy consolidation of left lung base with left pleural effusion noted. Mild patchy consolidation of medial right lung base is noted. Nasogastric tube is identified with distal tip in the distal stomach. The visualized skeletal structures are stable. IMPRESSION: Nasogastric tube with distal tip in the distal stomach. Patchy consolidation of left lung base with left pleural effusion noted. Mild patchy consolidation of medial right lung base is noted. Electronically Signed   By: Abelardo Diesel M.D.   On: 07/07/2022 09:19        Scheduled Meds:  [START ON 07/09/2022] amLODipine  10 mg Per Tube Daily   Chlorhexidine Gluconate Cloth  6 each Topical Daily   docusate  200 mg Per Tube Daily   feeding supplement (PROSource TF20)  60 mL Per Tube Daily   [START ON 32/11/4008] folic acid  1 mg Per Tube Daily   pantoprazole (PROTONIX) IV  40 mg Intravenous Q24H   Continuous Infusions:  dextrose 5% lactated ringers 75 mL/hr at 07/08/22 2725   feeding supplement (VITAL 1.5 CAL) 30 mL/hr at  07/07/22 1812     LOS: 8 days    Time spent: 35 minutes    Veneta Sliter Darleen Crocker, DO [facility]  If 7PM-7AM, please contact night-coverage [email or web address] 07/08/2022, 11:11 AM

## 2022-07-08 NOTE — Progress Notes (Signed)
Pt reported 10/10 pain several times. PRN 1 mg dilaudid given. NG tube flushed during the night.

## 2022-07-09 DIAGNOSIS — K861 Other chronic pancreatitis: Secondary | ICD-10-CM | POA: Diagnosis not present

## 2022-07-09 DIAGNOSIS — K859 Acute pancreatitis without necrosis or infection, unspecified: Secondary | ICD-10-CM | POA: Diagnosis not present

## 2022-07-09 LAB — COMPREHENSIVE METABOLIC PANEL
ALT: 6 U/L (ref 0–44)
AST: 14 U/L — ABNORMAL LOW (ref 15–41)
Albumin: 2.2 g/dL — ABNORMAL LOW (ref 3.5–5.0)
Alkaline Phosphatase: 63 U/L (ref 38–126)
Anion gap: 6 (ref 5–15)
BUN: <5 mg/dL — ABNORMAL LOW (ref 6–20)
CO2: 30 mmol/L (ref 22–32)
Calcium: 8.3 mg/dL — ABNORMAL LOW (ref 8.9–10.3)
Chloride: 100 mmol/L (ref 98–111)
Creatinine, Ser: 0.38 mg/dL — ABNORMAL LOW (ref 0.44–1.00)
GFR, Estimated: >60 mL/min (ref >60)
Glucose, Bld: 92 mg/dL (ref 70–99)
Potassium: 3.7 mmol/L (ref 3.5–5.1)
Sodium: 136 mmol/L (ref 135–145)
Total Bilirubin: 0.5 mg/dL (ref 0.3–1.2)
Total Protein: 6.4 g/dL — ABNORMAL LOW (ref 6.5–8.1)

## 2022-07-09 LAB — GLUCOSE, CAPILLARY
Glucose-Capillary: 122 mg/dL — ABNORMAL HIGH (ref 70–99)
Glucose-Capillary: 133 mg/dL — ABNORMAL HIGH (ref 70–99)

## 2022-07-09 LAB — MAGNESIUM: Magnesium: 1.4 mg/dL — ABNORMAL LOW (ref 1.7–2.4)

## 2022-07-09 MED ORDER — PHENOL 1.4 % MT LIQD
1.0000 | OROMUCOSAL | Status: DC | PRN
  Administered 2022-07-09 – 2022-07-10 (×2): 1 via OROMUCOSAL
  Filled 2022-07-09: qty 177

## 2022-07-09 MED ORDER — MAGNESIUM SULFATE 2 GM/50ML IV SOLN
2.0000 g | Freq: Once | INTRAVENOUS | Status: AC
  Administered 2022-07-09: 2 g via INTRAVENOUS
  Filled 2022-07-09: qty 50

## 2022-07-09 NOTE — Progress Notes (Signed)
Nutrition Follow up  DOCUMENTATION CODES:   Non-severe (moderate) malnutrition in context of chronic illness  INTERVENTION:  Continuous Vital 1.5 @ 30 ml/hr via NG tube  Recommend goal rate of 50 ml/hr   ProSource TF20 (60 ml) daily per tube  At goal rate tube feeding provides:1880 kcal, 101 gr protein and 917 ml water.   Multivitamin 5 ml daily per tube  NUTRITION DIAGNOSIS:   Moderate Malnutrition related to acute illness, chronic illness (acute on chronic pancreatitis) as evidenced by percent weight loss (8% x 1 month, 12.6% ~3 months and 17% x 5 months), mild muscle depletion, liquid diet 1 month PTA. -ongoing  GOAL:  Patient will meet greater than or equal to 90% of their needs - not met   MONITOR:  Diet advancement, Labs, Weight trends   ASSESSMENT: Patient is a 36 yo female with hx of DM2, ETOH use, Liddle syndrome, B-12 deficiency, hypokalemia and moderate malnutrition. Presents with acute on chronic pancreatitis.   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 She has been on liquid diet since 05/27/22. Primarily consuming Ensure, apple and grape juice and likes Mango Ice. Expressed feeling apprehensive about resuming oral intake.   Patient receiving clear liquids and per chart has been on liquid diet the past month.  GI following. Agree with NJ tube placement and enteral feeding initiation. Talked with nursing. NJ has not been placed at this time. Recommendations for tube feeding above when pt is ready to initiate nutrition support.   12/4 NG tube placed by surgery on 12/2. GI advancing tube feeding per their discretion. Patient also receiving clear liquids today-patient had mango ice and is sipping on apple juice. No complaints of nausea. Tolerating tube feeding Vital 1.5@ 20 ml/hr. Per GI will advance 10 ml/hr today.   Significant weight loss - 8% x 1 month, 12.6% ~3 months and 17% x 5 months. Energy intake < 75%  for >/= 1 month.   Medications  reviewed and include: folic acid, protonix, colace, klor-con.      Latest Ref Rng & Units 07/09/2022    5:04 AM 07/08/2022    4:47 AM 07/07/2022    3:44 AM  BMP  Glucose 70 - 99 mg/dL 92  92  80   BUN 6 - 20 mg/dL <5  <5  <5   Creatinine 0.44 - 1.00 mg/dL 0.38  0.43  0.36   Sodium 135 - 145 mmol/L 136  136  136   Potassium 3.5 - 5.1 mmol/L 3.7  4.8  4.0   Chloride 98 - 111 mmol/L 100  102  98   CO2 22 - 32 mmol/L _0 Calcium 8.9 - 10.3 mg/dL 8.3  8.5  8.6       Diet Order:   Diet Order             Diet clear liquid Room service appropriate? Yes; Fluid consistency: Thin  Diet effective now                  EDUCATION NEEDS:  No education needs have been identified at this time  Skin:  Skin Assessment: Reviewed RN Assessment  Last BM:  11/26  Height:   Ht Readings from Last 1 Encounters:  07/02/22 _1  (1.6 m)    Weight:   Wt Readings from Last 1 Encounters:  07/03/22 81.4 kg    Ideal Body Weight:   36 yo   BMI:  Body mass  index is 31.79 kg/m.  Estimated Nutritional Needs:   Kcal:  1800-2000  Protein:  99-110 gr  Fluid:  1.8-2.0 liters daily  Colman Cater MS,RD,CSG,LDN Contact: Shea Evans

## 2022-07-09 NOTE — Progress Notes (Signed)
Pt got an adequate amount of sleep tonight. Pt still complaining of 10/10 pain in epigastric region, PRN dilaudid given. NG tube flushed.

## 2022-07-09 NOTE — Progress Notes (Signed)
PROGRESS NOTE    Jillian Shepherd  YPP:[id number] DOB: Aug 24, 1985 DOA: 06/30/2022 PCP: [name], MD   Brief Narrative:    36 y.o. female with medical history significant of alcohol abuse, B12 deficiency, hypokalemia, Liddle syndrome, history of splenic hemorrhage with splenic subcapsular hematoma, presents to the ED with a chief complaint of abdominal pain.  She was readmitted with acute on chronic pancreatitis in the setting of possible alcohol use.     Assessment & Plan:   Principal Problem:   Acute on chronic pancreatitis Active Problems:   Essential hypertension   Hypokalemia   Acute hypoxic respiratory failure (HCC)   Malnutrition of moderate degree   Diabetes mellitus type II, controlled ([place])   Dehydration  Assessment and Plan:   Acute on chronic pancreatitis - Pt presented with abdominal pain with leukocytosis, thrombocytosis, CT scan showing acute pancreatitis with interval decrease in size of the distal pancreatic body cystic lesion that was likely a pseudocyst.  Component of necrotizing pancreatitis cannot be excluded.  Perisplenic subcapsular hematoma still present-hold anticoagulation - Patient finished Levaquin on November 12 - Discontinue cefepime and Flagyl - Last lipid panel shows triglycerides of 115 - No new medications that could have contributed to this acute flare - Previously thought to be alcoholic pancreatitis, but patient has not had a drink in 1 month per her report - Pt reports pain not controlled, changed to dilaudid 11/27 with better control.  Continue clear liquid diet. Appreciate GI team recommendations. -NG tube placed by general surgery 12/2 and tube feedings will be initiated with appropriate positioning noted on KUB; try trickle feeds  -CT abdomen and pelvis performed on 12/3 with no significant new changes -May consider Reglan if persistent nausea noted despite slow tube feedings   Diabetes mellitus type II, controlled ([facility]) - Hold  metformin - Sliding scale coverage - if BS less than 70 would add dextrose to IV fluid, continue to monitor - CLD     Malnutrition of moderate degree - Hypoalbuminemia at 3.2 - Patient has been on the liquid only diet for a month per her report - only able to tolerate clear liquids at this time.  - obtain nutrition team consult   Acute hypoxic respiratory failure-currently resolved - Likely related to pleural effusion - Pneumonia cannot be excluded on chest x-ray but no clinical symptoms - Patient has recently been on Levaquin and Omnicef plus doxycycline - Discontinue further cefepime - Pain medications could also be contributing to this hypoxia - Wean off as tolerated - Continue to monitor   Essential hypertension - Continue amlodipine   Obesity -BMI 31.79  Hypomagnesemia -Replete and reevaluate in a.m.   DVT prophylaxis: SCDs Code Status: Full Family Communication: None at bedside Disposition Plan:  Status is: Inpatient Remains inpatient appropriate because: Need for IV medications.   Consultants:  GI   Procedures:  None   Antimicrobials:  Anti-infectives (From admission, onward)    Start     Dose/Rate Route Frequency Ordered Stop   07/01/22 0600  metroNIDAZOLE (FLAGYL) IVPB 500 mg  Status:  Discontinued        500 mg 100 mL/hr over 60 Minutes Intravenous Every 12 hours 07/01/22 0314 07/04/22 1110   06/30/22 1830  ceFEPIme (MAXIPIME) 2 g in sodium chloride 0.9 % 100 mL IVPB  Status:  Discontinued        2 g 200 mL/hr over 30 Minutes Intravenous Every 8 hours 06/30/22 1815 07/04/22 1110   06/30/22 1815  ceFEPIme (MAXIPIME) 2  g in sodium chloride 0.9 % 100 mL IVPB  Status:  Discontinued        2 g 200 mL/hr over 30 Minutes Intravenous  Once 06/30/22 1802 06/30/22 1815   06/30/22 1815  metroNIDAZOLE (FLAGYL) IVPB 500 mg        500 mg 100 mL/hr over 60 Minutes Intravenous  Once 06/30/22 1802 06/30/22 1956      Subjective: Patient seen and evaluated today  with ongoing intermittent abdominal pain. No acute concerns or events noted overnight.  She appears to be tolerating tube feeds as ordered at a slower rate as well as some clear liquid diet.  She denies any nausea or vomiting.  Objective: Vitals:   07/08/22 0800 07/08/22 1609 07/08/22 1926 07/09/22 0456  BP: 113/82 110/84 126/83 122/83  Pulse: 77 88 87 83  Resp:  '19 16 18  '$ Temp:  98 F (36.7 C) 99 F (37.2 C) 98.2 F (36.8 C)  TempSrc:  Oral Oral Oral  SpO2:  93% 94% 94%  Weight:      Height:        Intake/Output Summary (Last 24 hours) at 07/09/2022 0956 Last data filed at 07/09/2022 0800 Gross per 24 hour  Intake 170 ml  Output --  Net 170 ml   Filed Weights   06/30/22 1722 07/02/22 0436 07/03/22 2150  Weight: 65.8 kg 75 kg 81.4 kg    Examination:  General exam: Appears calm and comfortable  Respiratory system: Clear to auscultation. Respiratory effort normal. Cardiovascular system: S1 & S2 heard, RRR.  Gastrointestinal system: Abdomen is soft, NG tube feedings ongoing Central nervous system: Alert and awake Extremities: No edema Skin: No significant lesions noted Psychiatry: Flat affect.    Data Reviewed: I have personally reviewed following labs and imaging studies  CBC: Recent Labs  Lab 07/03/22 0429 07/04/22 0423 07/05/22 0317 07/06/22 0353 07/07/22 0344 07/08/22 0447  WBC 6.9 6.3 6.9 7.2 6.7 6.5  NEUTROABS 4.2 3.7 4.0 3.8  --   --   HGB 10.2* 9.7* 9.8* 10.1* 11.0* 11.0*  HCT 32.5* 30.4* 30.9* 33.4* 35.4* 35.6*  MCV 98.5 96.5 96.3 97.9 95.7 96.0  PLT 389 362 359 382 400 462   Basic Metabolic Panel: Recent Labs  Lab 07/05/22 0317 07/06/22 0353 07/07/22 0344 07/08/22 0447 07/09/22 0504  NA 137 137 136 136 136  K 4.4 5.2* 4.0 4.8 3.7  CL 103 99 98 102 100  CO2 '30 31 31 27 30  '$ GLUCOSE 65* 66* 80 92 92  BUN <5* <5* <5* <5* <5*  CREATININE 0.36* 0.34* 0.36* 0.43* 0.38*  CALCIUM 8.4* 8.6* 8.6* 8.5* 8.3*  MG 1.5* 1.6* 1.7 1.7 1.4*    GFR: Estimated Creatinine Clearance: 97.3 mL/min (A) (by C-G formula based on SCr of 0.38 mg/dL (L)). Liver Function Tests: Recent Labs  Lab 07/05/22 0317 07/06/22 0353 07/07/22 0344 07/08/22 0447 07/09/22 0504  AST 14* 13* 14* 23 14*  ALT '7 6 7 11 6  '$ ALKPHOS 63 63 62 59 63  BILITOT 0.7 0.8 0.4 0.8 0.5  PROT 6.0* 6.2* 6.7 6.8 6.4*  ALBUMIN 2.2* 2.2* 2.3* 2.4* 2.2*   Recent Labs  Lab 07/03/22 0429 07/04/22 0423  LIPASE 80* 90*   No results for input(s): "AMMONIA" in the last 168 hours. Coagulation Profile: No results for input(s): "INR", "PROTIME" in the last 168 hours. Cardiac Enzymes: No results for input(s): "CKTOTAL", "CKMB", "CKMBINDEX", "TROPONINI" in the last 168 hours. BNP (last 3 results) No results for input(s): "  PROBNP" in the last 8760 hours. HbA1C: No results for input(s): "HGBA1C" in the last 72 hours. CBG: Recent Labs  Lab 07/07/22 2356 07/08/22 0820 07/08/22 1734 07/08/22 2338 07/09/22 0742  GLUCAP 101* 94 133* 97 122*   Lipid Profile: No results for input(s): "CHOL", "HDL", "LDLCALC", "TRIG", "CHOLHDL", "LDLDIRECT" in the last 72 hours. Thyroid Function Tests: No results for input(s): "TSH", "T4TOTAL", "FREET4", "T3FREE", "THYROIDAB" in the last 72 hours. Anemia Panel: No results for input(s): "VITAMINB12", "FOLATE", "FERRITIN", "TIBC", "IRON", "RETICCTPCT" in the last 72 hours. Sepsis Labs: No results for input(s): "PROCALCITON", "LATICACIDVEN" in the last 168 hours.  Recent Results (from the past 240 hour(s))  Resp Panel by RT-PCR (Flu A&B, Covid) Anterior Nasal Swab     Status: None   Collection Time: 06/30/22  6:02 PM   Specimen: Anterior Nasal Swab  Result Value Ref Range Status   SARS Coronavirus 2 by RT PCR NEGATIVE NEGATIVE Final    Comment: (NOTE) SARS-CoV-2 target nucleic acids are NOT DETECTED.  The SARS-CoV-2 RNA is generally detectable in upper respiratory specimens during the acute phase of infection. The  lowest concentration of SARS-CoV-2 viral copies this assay can detect is 138 copies/mL. A negative result does not preclude SARS-Cov-2 infection and should not be used as the sole basis for treatment or other patient management decisions. A negative result may occur with  improper specimen collection/handling, submission of specimen other than nasopharyngeal swab, presence of viral mutation(s) within the areas targeted by this assay, and inadequate number of viral copies(<138 copies/mL). A negative result must be combined with clinical observations, patient history, and epidemiological information. The expected result is Negative.  Fact Sheet for Patients:  EntrepreneurPulse.com.au  Fact Sheet for Healthcare Providers:  IncredibleEmployment.be  This test is no t yet approved or cleared by the [place] FDA and  has been authorized for detection and/or diagnosis of SARS-CoV-2 by FDA under an Emergency Use Authorization (EUA). This EUA will remain  in effect (meaning this test can be used) for the duration of the COVID-19 declaration under Section 564(b)(1) of the Act, 21 U.S.C.section 360bbb-3(b)(1), unless the authorization is terminated  or revoked sooner.       Influenza A by PCR NEGATIVE NEGATIVE Final   Influenza B by PCR NEGATIVE NEGATIVE Final    Comment: (NOTE) The Xpert Xpress SARS-CoV-2/FLU/RSV plus assay is intended as an aid in the diagnosis of influenza from Nasopharyngeal swab specimens and should not be used as a sole basis for treatment. Nasal washings and aspirates are unacceptable for Xpert Xpress SARS-CoV-2/FLU/RSV testing.  Fact Sheet for Patients: EntrepreneurPulse.com.au  Fact Sheet for Healthcare Providers: IncredibleEmployment.be  This test is not yet approved or cleared by the [place] FDA and has been authorized for detection and/or diagnosis of SARS-CoV-2 by FDA under  an Emergency Use Authorization (EUA). This EUA will remain in effect (meaning this test can be used) for the duration of the COVID-19 declaration under Section 564(b)(1) of the Act, 21 U.S.C. section 360bbb-3(b)(1), unless the authorization is terminated or revoked.  Performed at [facility], [address] [place], [place] [address]   Blood culture (routine x 2)     Status: None   Collection Time: 06/30/22  6:13 PM   Specimen: BLOOD LEFT FOREARM  Result Value Ref Range Status   Specimen Description   Final    BLOOD LEFT FOREARM BOTTLES DRAWN AEROBIC AND ANAEROBIC   Special Requests Blood Culture adequate volume  Final   Culture   Final  NO GROWTH 5 DAYS Performed at [facility], [address] [place], [place] [address]    Report Status 07/05/2022 FINAL  Final  Blood culture (routine x 2)     Status: None   Collection Time: 06/30/22  6:29 PM   Specimen: Right Antecubital; Blood  Result Value Ref Range Status   Specimen Description   Final    RIGHT ANTECUBITAL BOTTLES DRAWN AEROBIC AND ANAEROBIC   Special Requests Blood Culture adequate volume  Final   Culture   Final    NO GROWTH 5 DAYS Performed at [facility], [address], [place], [place] [address]    Report Status 07/05/2022 FINAL  Final  Urine Culture     Status: None   Collection Time: 06/30/22  9:21 PM   Specimen: Urine, Clean Catch  Result Value Ref Range Status   Specimen Description   Final    URINE, CLEAN CATCH Performed at [facility], [address] [place], [place] [address]    Special Requests   Final    NONE Performed at [facility], [address] [place], [place] [address]    Culture   Final    NO GROWTH Performed at [facility], [place] [address] [place], [place] [address]    Report Status 07/02/2022 FINAL  Final  MRSA Next Gen by PCR, Nasal     Status: None   Collection Time: 07/01/22  3:09 AM   Specimen: Nasal Mucosa; Nasal Swab  Result Value Ref Range Status   MRSA by PCR Next  Gen NOT DETECTED NOT DETECTED Final    Comment: (NOTE) The GeneXpert MRSA Assay (FDA approved for NASAL specimens only), is one component of a comprehensive MRSA colonization surveillance program. It is not intended to diagnose MRSA infection nor to guide or monitor treatment for MRSA infections. Test performance is not FDA approved in patients less than 70 years old. Performed at [facility], [address], [place], [place] [address]          Radiology Studies: CT ABDOMEN PELVIS W CONTRAST  Result Date: 07/08/2022 CLINICAL DATA:  Follow-up acute pancreatitis and splenic hematoma. EXAM: CT ABDOMEN AND PELVIS WITH CONTRAST TECHNIQUE: Multidetector CT imaging of the abdomen and pelvis was performed using the standard protocol following bolus administration of intravenous contrast. RADIATION DOSE REDUCTION: This exam was performed according to the departmental dose-optimization program which includes automated exposure control, adjustment of the mA and/or kV according to patient size and/or use of iterative reconstruction technique. CONTRAST:  133m OMNIPAQUE IOHEXOL 300 MG/ML  SOLN COMPARISON:  06/30/2022 FINDINGS: Lower Chest: Moderate left pleural effusion and left lower lobe atelectasis show mild increase since previous study. Hepatobiliary: No hepatic masses identified. Nonocclusive thrombus is seen in the main and right portal veins. Gallbladder is unremarkable. No evidence of biliary ductal dilatation. Pancreas: Moderate acute pancreatitis shows no significant change. A 2.2 x 1.9 cm fluid collection in the splenic tail is also unchanged and consistent with a small pseudocyst. Spleen: Large subcapsular splenic hematoma measures 14.3 x 10.5 cm, without significant change in size compared to prior study. Splenic vein thrombosis also noted with venous collateral seen in the gastrosplenic ligament. Adrenals/Urinary Tract: No suspicious masses identified. No evidence of ureteral calculi or  hydronephrosis. Stomach/Bowel: Nasogastric tube tip is seen in the distal gastric body. No evidence of obstruction, inflammatory process or abnormal fluid collections. Vascular/Lymphatic: No pathologically enlarged lymph nodes. Nonocclusive thrombus in the main and right portal veins and splenic vein thrombosis again  noted, as described above. Reproductive:  No mass or other significant abnormality. Other:  None. Musculoskeletal:  No suspicious bone lesions identified. IMPRESSION: No significant change in moderate acute pancreatitis. Stable 2.2 cm pseudocyst in splenic tail. No significant change in large subcapsular splenic hematoma. Nonocclusive thrombus in the main and right portal veins. Splenic vein thrombosis. Increased moderate left pleural effusion and left lower lobe atelectasis. Electronically Signed   By: Marlaine Hind M.D.   On: 07/08/2022 10:02        Scheduled Meds:  amLODipine  10 mg Per Tube Daily   Chlorhexidine Gluconate Cloth  6 each Topical Daily   docusate  200 mg Per Tube Daily   feeding supplement (PROSource TF20)  60 mL Per Tube Daily   folic acid  1 mg Per Tube Daily   pantoprazole (PROTONIX) IV  40 mg Intravenous Q24H   Continuous Infusions:  dextrose 5% lactated ringers 75 mL/hr at 07/08/22 1828   feeding supplement (VITAL 1.5 CAL) 1,000 mL (07/09/22 0550)   magnesium sulfate bolus IVPB       LOS: 9 days    Time spent: 35 minutes    Kaveh Kissinger Darleen Crocker, DO [facility]  If 7PM-7AM, please contact night-coverage [email or web address] 07/09/2022, 9:56 AM

## 2022-07-09 NOTE — Progress Notes (Signed)
Gastroenterology Progress Note    Primary Care Physician:  [name], MD Primary Gastroenterologist:  [facility]  Patient ID: Jillian Shepherd; [id number]; February 13, 1986    Subjective   Still with abdominal pain but improved from yesterday. No vomiting in 48 hours. Nausea but improving. Tolerating tube feeds at 20 mL/hour. (Increased from 10).    Objective   Vital signs in last 24 hours Temp:  [98 F (36.7 C)-99 F (37.2 C)] 98.2 F (36.8 C) (12/04 0456) Pulse Rate:  [83-88] 83 (12/04 0456) Resp:  [16-19] 18 (12/04 0456) BP: (110-126)/(83-84) 122/83 (12/04 0456) SpO2:  [93 %-94 %] 94 % (12/04 0456) Last BM Date : 07/01/22  Physical Exam General:   Alert and oriented, pleasant Head:  Normocephalic and atraumatic. Abdomen:  Bowel sounds present, soft, mild TTP epigastric without rebound or guarding.  Neurologic:  Alert and  oriented x4   Intake/Output from previous day: 12/03 0701 - 12/04 0700 In: 11 [P.O.:50] Out: -  Intake/Output this shift: Total I/O In: 120 [P.O.:120] Out: -   Lab Results  Recent Labs    07/07/22 0344 07/08/22 0447  WBC 6.7 6.5  HGB 11.0* 11.0*  HCT 35.4* 35.6*  PLT 400 258   BMET Recent Labs    07/07/22 0344 07/08/22 0447 07/09/22 0504  NA 136 136 136  K 4.0 4.8 3.7  CL 98 102 100  CO2 '31 27 30  '$ GLUCOSE 80 92 92  BUN <5* <5* <5*  CREATININE 0.36* 0.43* 0.38*  CALCIUM 8.6* 8.5* 8.3*   LFT Recent Labs    07/07/22 0344 07/08/22 0447 07/09/22 0504  PROT 6.7 6.8 6.4*  ALBUMIN 2.3* 2.4* 2.2*  AST 14* 23 14*  ALT '7 11 6  '$ ALKPHOS 62 59 63  BILITOT 0.4 0.8 0.5     Studies/Results CT ABDOMEN PELVIS W CONTRAST  Result Date: 07/08/2022 CLINICAL DATA:  Follow-up acute pancreatitis and splenic hematoma. EXAM: CT ABDOMEN AND PELVIS WITH CONTRAST TECHNIQUE: Multidetector CT imaging of the abdomen and pelvis was performed using the standard protocol following bolus administration of intravenous contrast. RADIATION DOSE  REDUCTION: This exam was performed according to the departmental dose-optimization program which includes automated exposure control, adjustment of the mA and/or kV according to patient size and/or use of iterative reconstruction technique. CONTRAST:  119m OMNIPAQUE IOHEXOL 300 MG/ML  SOLN COMPARISON:  06/30/2022 FINDINGS: Lower Chest: Moderate left pleural effusion and left lower lobe atelectasis show mild increase since previous study. Hepatobiliary: No hepatic masses identified. Nonocclusive thrombus is seen in the main and right portal veins. Gallbladder is unremarkable. No evidence of biliary ductal dilatation. Pancreas: Moderate acute pancreatitis shows no significant change. A 2.2 x 1.9 cm fluid collection in the splenic tail is also unchanged and consistent with a small pseudocyst. Spleen: Large subcapsular splenic hematoma measures 14.3 x 10.5 cm, without significant change in size compared to prior study. Splenic vein thrombosis also noted with venous collateral seen in the gastrosplenic ligament. Adrenals/Urinary Tract: No suspicious masses identified. No evidence of ureteral calculi or hydronephrosis. Stomach/Bowel: Nasogastric tube tip is seen in the distal gastric body. No evidence of obstruction, inflammatory process or abnormal fluid collections. Vascular/Lymphatic: No pathologically enlarged lymph nodes. Nonocclusive thrombus in the main and right portal veins and splenic vein thrombosis again noted, as described above. Reproductive:  No mass or other significant abnormality. Other:  None. Musculoskeletal:  No suspicious bone lesions identified. IMPRESSION: No significant change in moderate acute pancreatitis. Stable 2.2 cm pseudocyst in splenic tail.  No significant change in large subcapsular splenic hematoma. Nonocclusive thrombus in the main and right portal veins. Splenic vein thrombosis. Increased moderate left pleural effusion and left lower lobe atelectasis. Electronically Signed   By: Marlaine Hind M.D.   On: 07/08/2022 10:02   DG Chest Port 1 View  Result Date: 07/07/2022 CLINICAL DATA:  Assess for nasogastric tube placement EXAM: PORTABLE CHEST 1 VIEW COMPARISON:  June 30, 2022 FINDINGS: The heart size and mediastinal contours are stable. Patchy consolidation of left lung base with left pleural effusion noted. Mild patchy consolidation of medial right lung base is noted. Nasogastric tube is identified with distal tip in the distal stomach. The visualized skeletal structures are stable. IMPRESSION: Nasogastric tube with distal tip in the distal stomach. Patchy consolidation of left lung base with left pleural effusion noted. Mild patchy consolidation of medial right lung base is noted. Electronically Signed   By: Abelardo Diesel M.D.   On: 07/07/2022 09:19   CT ABDOMEN PELVIS W CONTRAST  Result Date: 06/30/2022 CLINICAL DATA:  Abdominal pain, acute, nonlocalized diffuse pain, recent pancreatitis EXAM: CT ABDOMEN AND PELVIS WITH CONTRAST TECHNIQUE: Multidetector CT imaging of the abdomen and pelvis was performed using the standard protocol following bolus administration of intravenous contrast. RADIATION DOSE REDUCTION: This exam was performed according to the departmental dose-optimization program which includes automated exposure control, adjustment of the mA and/or kV according to patient size and/or use of iterative reconstruction technique. CONTRAST:  110m OMNIPAQUE IOHEXOL 300 MG/ML  SOLN COMPARISON:  None Available. FINDINGS: Lower chest: Trace bilateral, left greater than right, pleural effusion. Associated passive atelectasis of left lower lobe. Hepatobiliary: The liver is enlarged measuring up to 19 cm. No focal liver abnormality. No gallstones or definite gallbladder wall thickening. Positive for pericholecystic fluid. No biliary dilatation. Pancreas: No focal lesion. Hazy pancreatic contour with associated peripancreatic fat stranding and free fluid. Slightly decreased in size  cystic lesion measuring 2 x 2 cm along the distal pancreatic body. Poorly visualized distal pancreatic tail likely due to cystic changes and edema with underlying nonenhancement of the pancreatic tail not fully excluded. Interval resolution of cystic lesion along the greater curvature of the stomach. No main pancreatic ductal dilatation. Spleen: Similar-appearing heterogeneous 14 x 8 cm perisplenic subcapsular lesion likely representing a hematoma. Adrenals/Urinary Tract: No focal lesion. Normal pancreatic contour. No surrounding inflammatory changes. No main pancreatic ductal dilatation. Stomach/Bowel: Stomach is within normal limits. No evidence of bowel wall thickening or dilatation. Appendix appears normal. Vascular/Lymphatic: No splenic artery aneurysm identified. Venous collaterals noted. No abdominal aorta or iliac aneurysm. Mild atherosclerotic plaque of the aorta and its branches. No abdominal, pelvic, or inguinal lymphadenopathy. Reproductive: Uterus and bilateral adnexa are unremarkable. Other: Small volume simple free fluid ascites. No intraperitoneal free gas. No organized fluid collection. Musculoskeletal: No abdominal wall hernia or abnormality. Soft tissue density along the anterior abdominal wall likely due to medication injection. No suspicious lytic or blastic osseous lesions. No acute displaced fracture. Multilevel degenerative changes of the spine. IMPRESSION: 1. Acute pancreatitis with interval decrease in size of a distal pancreatic body cystic lesion likely representing a pseudocyst. Interval resolution of cystic lesion along the greater curvature of the stomach. Component of necrotizing pancreatitis along the pancreatic tail is not fully excluded. 2. Similar-appearing heterogeneous 14 x 8 cm perisplenic subcapsular hematoma. 3. Nonspecific pericholecystic fluid with no definite CT findings acute cholecystitis. 4. Small volume simple free fluid ascites. 5. Trace bilateral, left greater than  right, pleural effusion. 6.  Mild hepatomegaly. 7. Venous collaterals. Electronically Signed   By: Iven Finn M.D.   On: 06/30/2022 22:45   DG Chest Port 1 View  Result Date: 06/30/2022 CLINICAL DATA:  Recent effusion with decreased lung sounds on the left EXAM: PORTABLE CHEST 1 VIEW COMPARISON:  Radiographs 06/13/2022 FINDINGS: Slight decrease in small left pleural effusion and associated atelectasis since 06/13/2022. Possible trace right pleural effusion. No pneumothorax. Stable cardiomediastinal silhouette. No acute osseous abnormality. IMPRESSION: Since 06/13/2022, decreased small left pleural effusion and associated atelectasis. Pneumonia is difficult to exclude. Electronically Signed   By: Placido Sou M.D.   On: 06/30/2022 18:26    Assessment  36 y.o. female with a history of  alcohol abuse complicated by alcoholic pancreatitis, HTN, Liddle syndrome, and recent hospitalization due to recurrent pancreatitis complicated by splenic hematoma and splenic vein thrombosis, presenting this admission with acute on chronic abdominal pain.    Acute on chronic pancreatitis: unable to advance diet. NG tube placed on 12/2. Initially with some difficulty tolerating feeds; this was started at lower rate 10 mL/hour and advancing my 10 mL/hour every 6 hours. Currently tolerating 20 mL/hour. Abdominal pain and nausea improved. CT yesterday with no significant change and stable pseudocyst in splenic tail. No significant change in splenic hematoma. Non-occlusive thrombus in main and right portal veins. Splenic vein thrombosis. Previously was on anticoagulation but discontinued due to hematoma.     Plan / Recommendations  Continue slow advancement of NG tube feeds Continue colace daily PPI daily Will continue to follow with you    LOS: 9 days    07/09/2022, 10:02 AM  Annitta Needs, PhD, ANP-BC [facility]

## 2022-07-10 DIAGNOSIS — K859 Acute pancreatitis without necrosis or infection, unspecified: Secondary | ICD-10-CM | POA: Diagnosis not present

## 2022-07-10 DIAGNOSIS — K861 Other chronic pancreatitis: Secondary | ICD-10-CM | POA: Diagnosis not present

## 2022-07-10 LAB — BASIC METABOLIC PANEL
Anion gap: 6 (ref 5–15)
BUN: <5 mg/dL — ABNORMAL LOW (ref 6–20)
CO2: 29 mmol/L (ref 22–32)
Calcium: 8.1 mg/dL — ABNORMAL LOW (ref 8.9–10.3)
Chloride: 102 mmol/L (ref 98–111)
Creatinine, Ser: 0.34 mg/dL — ABNORMAL LOW (ref 0.44–1.00)
GFR, Estimated: >60 mL/min (ref >60)
Glucose, Bld: 112 mg/dL — ABNORMAL HIGH (ref 70–99)
Potassium: 3.7 mmol/L (ref 3.5–5.1)
Sodium: 137 mmol/L (ref 135–145)

## 2022-07-10 LAB — MAGNESIUM: Magnesium: 1.8 mg/dL (ref 1.7–2.4)

## 2022-07-10 LAB — GLUCOSE, CAPILLARY
Glucose-Capillary: 115 mg/dL — ABNORMAL HIGH (ref 70–99)
Glucose-Capillary: 116 mg/dL — ABNORMAL HIGH (ref 70–99)
Glucose-Capillary: 141 mg/dL — ABNORMAL HIGH (ref 70–99)

## 2022-07-10 LAB — FUNGUS CULTURE WITH STAIN

## 2022-07-10 LAB — FUNGUS CULTURE RESULT

## 2022-07-10 LAB — FUNGAL ORGANISM REFLEX

## 2022-07-10 MED ORDER — LACTATED RINGERS IV SOLN: INTRAVENOUS | Status: DC

## 2022-07-10 NOTE — Progress Notes (Signed)
PROGRESS NOTE    Jillian Shepherd  VEL:[id number] DOB: 30-Oct-1985 DOA: 06/30/2022 PCP: [name], MD   Brief Narrative:    36 y.o. female with medical history significant of alcohol abuse, B12 deficiency, hypokalemia, Liddle syndrome, history of splenic hemorrhage with splenic subcapsular hematoma, presents to the ED with a chief complaint of abdominal pain.  She was readmitted with acute on chronic pancreatitis in the setting of possible alcohol use.  Her diet is being slowly advanced and she is currently tolerating tube feeds near her goal weight along with clear liquid diet.  She continues to have ongoing abdominal pain however and requires close monitoring and slow dietary advancement.  GI following.  Assessment & Plan:   Principal Problem:   Acute on chronic pancreatitis Active Problems:   Essential hypertension   Hypokalemia   Acute hypoxic respiratory failure (HCC)   Malnutrition of moderate degree   Diabetes mellitus type II, controlled (HCC)   Dehydration  Assessment and Plan:   Acute on chronic pancreatitis-slowly improving - Pt presented with abdominal pain with leukocytosis, thrombocytosis, CT scan showing acute pancreatitis with interval decrease in size of the distal pancreatic body cystic lesion that was likely a pseudocyst.  Component of necrotizing pancreatitis cannot be excluded.  Perisplenic subcapsular hematoma still present-hold anticoagulation - Patient finished Levaquin on November 12 - Flagyl and cefepime discontinued - Last lipid panel shows triglycerides of 115 - No new medications that could have contributed to this acute flare - Previously thought to be alcoholic pancreatitis, but patient has not had a drink in 1 month per her report - Pt reports pain not controlled, changed to dilaudid 11/27 with better control.  Continue clear liquid diet. Appreciate GI team recommendations. -NG tube placed by general surgery 12/2 and tube feedings are tolerated  thus far -CT abdomen and pelvis performed on 12/3 with no significant new changes -Continue clear liquid diet and advance further as tolerated with eventual discontinuation of tube feedings. -Appreciate ongoing GI recommendations   Diabetes mellitus type II, controlled ([place]) - Hold metformin - Sliding scale coverage - if BS less than 70 would add dextrose to IV fluid, continue to monitor - CLD     Malnutrition of moderate degree - Hypoalbuminemia at 3.2 - Patient has been on the liquid only diet for a month per her report - only able to tolerate clear liquids at this time.  - obtain nutrition team consult   Acute hypoxic respiratory failure-currently resolved - Likely related to pleural effusion - Pneumonia cannot be excluded on chest x-ray but no clinical symptoms - Patient has recently been on Levaquin and Omnicef plus doxycycline - Discontinue further cefepime - Pain medications could also be contributing to this hypoxia - Wean off as tolerated - Continue to monitor   Essential hypertension - Continue amlodipine   Obesity -BMI 31.79     DVT prophylaxis: SCDs Code Status: Full Family Communication: None at bedside Disposition Plan:  Status is: Inpatient Remains inpatient appropriate because: Need for IV medications.   Consultants:  GI   Procedures:  None   Antimicrobials:  Anti-infectives (From admission, onward)    Start     Dose/Rate Route Frequency Ordered Stop   07/01/22 0600  metroNIDAZOLE (FLAGYL) IVPB 500 mg  Status:  Discontinued        500 mg 100 mL/hr over 60 Minutes Intravenous Every 12 hours 07/01/22 0314 07/04/22 1110   06/30/22 1830  ceFEPIme (MAXIPIME) 2 g in sodium chloride 0.9 % 100 mL  IVPB  Status:  Discontinued        2 g 200 mL/hr over 30 Minutes Intravenous Every 8 hours 06/30/22 1815 07/04/22 1110   06/30/22 1815  ceFEPIme (MAXIPIME) 2 g in sodium chloride 0.9 % 100 mL IVPB  Status:  Discontinued        2 g 200 mL/hr over 30 Minutes  Intravenous  Once 06/30/22 1802 06/30/22 1815   06/30/22 1815  metroNIDAZOLE (FLAGYL) IVPB 500 mg        500 mg 100 mL/hr over 60 Minutes Intravenous  Once 06/30/22 1802 06/30/22 1956      Subjective: Patient seen and evaluated today with ongoing abdominal pain overnight, however she appears to be tolerating her tube feeds and clear liquid diet with no nausea or vomiting.  She appears to have slept well overnight.  Objective: Vitals:   07/09/22 1411 07/09/22 2054 07/10/22 0621 07/10/22 0914  BP: 113/80 (!) 134/90 90/67 111/81  Pulse: 92 85 75 84  Resp: '14 14 14   '$ Temp: 97.9 F (36.6 C) 98.3 F (36.8 C) 98 F (36.7 C)   TempSrc:      SpO2: 90% 93% (!) 89%   Weight:      Height:        Intake/Output Summary (Last 24 hours) at 07/10/2022 0925 Last data filed at 07/09/2022 1923 Gross per 24 hour  Intake 1493.5 ml  Output --  Net 1493.5 ml   Filed Weights   06/30/22 1722 07/02/22 0436 07/03/22 2150  Weight: 65.8 kg 75 kg 81.4 kg    Examination:  General exam: Appears calm and comfortable  Respiratory system: Clear to auscultation. Respiratory effort normal. Cardiovascular system: S1 & S2 heard, RRR.  Gastrointestinal system: Abdomen is soft, NG tube with ongoing feeds Central nervous system: Alert and awake Extremities: No edema Skin: No significant lesions noted Psychiatry: Flat affect.    Data Reviewed: I have personally reviewed following labs and imaging studies  CBC: Recent Labs  Lab 07/04/22 0423 07/05/22 0317 07/06/22 0353 07/07/22 0344 07/08/22 0447  WBC 6.3 6.9 7.2 6.7 6.5  NEUTROABS 3.7 4.0 3.8  --   --   HGB 9.7* 9.8* 10.1* 11.0* 11.0*  HCT 30.4* 30.9* 33.4* 35.4* 35.6*  MCV 96.5 96.3 97.9 95.7 96.0  PLT 362 359 382 400 027   Basic Metabolic Panel: Recent Labs  Lab 07/06/22 0353 07/07/22 0344 07/08/22 0447 07/09/22 0504 07/10/22 0453  NA 137 136 136 136 137  K 5.2* 4.0 4.8 3.7 3.7  CL 99 98 102 100 102  CO2 '31 31 27 30 29  '$ GLUCOSE  66* 80 92 92 112*  BUN <5* <5* <5* <5* <5*  CREATININE 0.34* 0.36* 0.43* 0.38* 0.34*  CALCIUM 8.6* 8.6* 8.5* 8.3* 8.1*  MG 1.6* 1.7 1.7 1.4* 1.8   GFR: Estimated Creatinine Clearance: 97.3 mL/min (A) (by C-G formula based on SCr of 0.34 mg/dL (L)). Liver Function Tests: Recent Labs  Lab 07/05/22 0317 07/06/22 0353 07/07/22 0344 07/08/22 0447 07/09/22 0504  AST 14* 13* 14* 23 14*  ALT '7 6 7 11 6  '$ ALKPHOS 63 63 62 59 63  BILITOT 0.7 0.8 0.4 0.8 0.5  PROT 6.0* 6.2* 6.7 6.8 6.4*  ALBUMIN 2.2* 2.2* 2.3* 2.4* 2.2*   Recent Labs  Lab 07/04/22 0423  LIPASE 90*   No results for input(s): "AMMONIA" in the last 168 hours. Coagulation Profile: No results for input(s): "INR", "PROTIME" in the last 168 hours. Cardiac Enzymes: No results for  input(s): "CKTOTAL", "CKMB", "CKMBINDEX", "TROPONINI" in the last 168 hours. BNP (last 3 results) No results for input(s): "PROBNP" in the last 8760 hours. HbA1C: No results for input(s): "HGBA1C" in the last 72 hours. CBG: Recent Labs  Lab 07/08/22 2338 07/09/22 0742 07/09/22 1804 07/10/22 0000 07/10/22 0731  GLUCAP 97 122* 133* 116* 115*   Lipid Profile: No results for input(s): "CHOL", "HDL", "LDLCALC", "TRIG", "CHOLHDL", "LDLDIRECT" in the last 72 hours. Thyroid Function Tests: No results for input(s): "TSH", "T4TOTAL", "FREET4", "T3FREE", "THYROIDAB" in the last 72 hours. Anemia Panel: No results for input(s): "VITAMINB12", "FOLATE", "FERRITIN", "TIBC", "IRON", "RETICCTPCT" in the last 72 hours. Sepsis Labs: No results for input(s): "PROCALCITON", "LATICACIDVEN" in the last 168 hours.  Recent Results (from the past 240 hour(s))  Resp Panel by RT-PCR (Flu A&B, Covid) Anterior Nasal Swab     Status: None   Collection Time: 06/30/22  6:02 PM   Specimen: Anterior Nasal Swab  Result Value Ref Range Status   SARS Coronavirus 2 by RT PCR NEGATIVE NEGATIVE Final    Comment: (NOTE) SARS-CoV-2 target nucleic acids are NOT  DETECTED.  The SARS-CoV-2 RNA is generally detectable in upper respiratory specimens during the acute phase of infection. The lowest concentration of SARS-CoV-2 viral copies this assay can detect is 138 copies/mL. A negative result does not preclude SARS-Cov-2 infection and should not be used as the sole basis for treatment or other patient management decisions. A negative result may occur with  improper specimen collection/handling, submission of specimen other than nasopharyngeal swab, presence of viral mutation(s) within the areas targeted by this assay, and inadequate number of viral copies(<138 copies/mL). A negative result must be combined with clinical observations, patient history, and epidemiological information. The expected result is Negative.  Fact Sheet for Patients:  EntrepreneurPulse.com.au  Fact Sheet for Healthcare Providers:  IncredibleEmployment.be  This test is no t yet approved or cleared by the [place] FDA and  has been authorized for detection and/or diagnosis of SARS-CoV-2 by FDA under an Emergency Use Authorization (EUA). This EUA will remain  in effect (meaning this test can be used) for the duration of the COVID-19 declaration under Section 564(b)(1) of the Act, 21 U.S.C.section 360bbb-3(b)(1), unless the authorization is terminated  or revoked sooner.       Influenza A by PCR NEGATIVE NEGATIVE Final   Influenza B by PCR NEGATIVE NEGATIVE Final    Comment: (NOTE) The Xpert Xpress SARS-CoV-2/FLU/RSV plus assay is intended as an aid in the diagnosis of influenza from Nasopharyngeal swab specimens and should not be used as a sole basis for treatment. Nasal washings and aspirates are unacceptable for Xpert Xpress SARS-CoV-2/FLU/RSV testing.  Fact Sheet for Patients: EntrepreneurPulse.com.au  Fact Sheet for Healthcare Providers: IncredibleEmployment.be  This test is not yet  approved or cleared by the [place] FDA and has been authorized for detection and/or diagnosis of SARS-CoV-2 by FDA under an Emergency Use Authorization (EUA). This EUA will remain in effect (meaning this test can be used) for the duration of the COVID-19 declaration under Section 564(b)(1) of the Act, 21 U.S.C. section 360bbb-3(b)(1), unless the authorization is terminated or revoked.  Performed at [facility], [address], [place], [place] [address]   Blood culture (routine x 2)     Status: None   Collection Time: 06/30/22  6:13 PM   Specimen: BLOOD LEFT FOREARM  Result Value Ref Range Status   Specimen Description   Final    BLOOD LEFT FOREARM BOTTLES DRAWN AEROBIC  AND ANAEROBIC   Special Requests Blood Culture adequate volume  Final   Culture   Final    NO GROWTH 5 DAYS Performed at [facility], [address], [place], [place] [address]    Report Status 07/05/2022 FINAL  Final  Blood culture (routine x 2)     Status: None   Collection Time: 06/30/22  6:29 PM   Specimen: Right Antecubital; Blood  Result Value Ref Range Status   Specimen Description   Final    RIGHT ANTECUBITAL BOTTLES DRAWN AEROBIC AND ANAEROBIC   Special Requests Blood Culture adequate volume  Final   Culture   Final    NO GROWTH 5 DAYS Performed at [facility], [address], [place], [place] [address]    Report Status 07/05/2022 FINAL  Final  Urine Culture     Status: None   Collection Time: 06/30/22  9:21 PM   Specimen: Urine, Clean Catch  Result Value Ref Range Status   Specimen Description   Final    URINE, CLEAN CATCH Performed at [facility], [address], [place], [place] [address]    Special Requests   Final    NONE Performed at [facility], [address], [place], [place] [address]    Culture   Final    NO GROWTH Performed at [facility], [place] [address], [place], [place] [address]    Report Status 07/02/2022 FINAL  Final  MRSA Next Gen by PCR, Nasal     Status: None    Collection Time: 07/01/22  3:09 AM   Specimen: Nasal Mucosa; Nasal Swab  Result Value Ref Range Status   MRSA by PCR Next Gen NOT DETECTED NOT DETECTED Final    Comment: (NOTE) The GeneXpert MRSA Assay (FDA approved for NASAL specimens only), is one component of a comprehensive MRSA colonization surveillance program. It is not intended to diagnose MRSA infection nor to guide or monitor treatment for MRSA infections. Test performance is not FDA approved in patients less than 31 years old. Performed at [facility], [address] [place], [place] [address]          Radiology Studies: No results found.      Scheduled Meds:  amLODipine  10 mg Per Tube Daily   Chlorhexidine Gluconate Cloth  6 each Topical Daily   docusate  200 mg Per Tube Daily   feeding supplement (PROSource TF20)  60 mL Per Tube Daily   folic acid  1 mg Per Tube Daily   pantoprazole (PROTONIX) IV  40 mg Intravenous Q24H   Continuous Infusions:  feeding supplement (VITAL 1.5 CAL) 1,000 mL (07/10/22 0201)   lactated ringers       LOS: 10 days    Time spent: 35 minutes    Deshanae Lindo Darleen Crocker, DO [facility]  If 7PM-7AM, please contact night-coverage [email or web address] 07/10/2022, 9:25 AM

## 2022-07-10 NOTE — Progress Notes (Signed)
Gastroenterology Progress Note    Primary Care Physician:  [name], MD Primary Gastroenterologist:  [facility]   Patient ID: Jillian Shepherd; [id number]; [birth year]    Subjective   Nausea resolved. Tolerating tube feeds at goal of 50 mL/hour since 0200. No complaints today. Feeling much better. Small BM yesterday. Didn't take colace today as it cramped her stomach. Feels like she will have a BM later today.    Objective   Vital signs in last 24 hours Temp:  [97.9 F (36.6 C)-98.3 F (36.8 C)] 98 F (36.7 C) (12/05 0621) Pulse Rate:  [75-92] 84 (12/05 0914) Resp:  [14] 14 (12/05 0621) BP: (90-134)/(67-90) 111/81 (12/05 0914) SpO2:  [89 %-93 %] 89 % (12/05 0621) Last BM Date : 07/01/22  Physical Exam General:   Alert and oriented, pleasant Head:  Normocephalic and atraumatic. Abdomen:  Bowel sounds present, soft, minimal TTP epigastric, no rebound or guarding Extremities:  Without  edema. Neurologic:  Alert and  oriented x4   Intake/Output from previous day: 12/04 0701 - 12/05 0700 In: 1613.5 [P.O.:720; NG/GT:893.5] Out: -  Intake/Output this shift: Total I/O In: 360 [P.O.:360] Out: -   Lab Results  Recent Labs    07/08/22 0447  WBC 6.5  HGB 11.0*  HCT 35.6*  PLT 258   BMET Recent Labs    07/08/22 0447 07/09/22 0504 07/10/22 0453  NA 136 136 137  K 4.8 3.7 3.7  CL 102 100 102  CO2 '27 30 29  '$ GLUCOSE 92 92 112*  BUN <5* <5* <5*  CREATININE 0.43* 0.38* 0.34*  CALCIUM 8.5* 8.3* 8.1*   LFT Recent Labs    07/08/22 0447 07/09/22 0504  PROT 6.8 6.4*  ALBUMIN 2.4* 2.2*  AST 23 14*  ALT 11 6  ALKPHOS 59 63  BILITOT 0.8 0.5     Studies/Results CT ABDOMEN PELVIS W CONTRAST  Result Date: 07/08/2022 CLINICAL DATA:  Follow-up acute pancreatitis and splenic hematoma. EXAM: CT ABDOMEN AND PELVIS WITH CONTRAST TECHNIQUE: Multidetector CT imaging of the abdomen and pelvis was performed using the standard protocol following bolus  administration of intravenous contrast. RADIATION DOSE REDUCTION: This exam was performed according to the departmental dose-optimization program which includes automated exposure control, adjustment of the mA and/or kV according to patient size and/or use of iterative reconstruction technique. CONTRAST:  156m OMNIPAQUE IOHEXOL 300 MG/ML  SOLN COMPARISON:  06/30/2022 FINDINGS: Lower Chest: Moderate left pleural effusion and left lower lobe atelectasis show mild increase since previous study. Hepatobiliary: No hepatic masses identified. Nonocclusive thrombus is seen in the main and right portal veins. Gallbladder is unremarkable. No evidence of biliary ductal dilatation. Pancreas: Moderate acute pancreatitis shows no significant change. A 2.2 x 1.9 cm fluid collection in the splenic tail is also unchanged and consistent with a small pseudocyst. Spleen: Large subcapsular splenic hematoma measures 14.3 x 10.5 cm, without significant change in size compared to prior study. Splenic vein thrombosis also noted with venous collateral seen in the gastrosplenic ligament. Adrenals/Urinary Tract: No suspicious masses identified. No evidence of ureteral calculi or hydronephrosis. Stomach/Bowel: Nasogastric tube tip is seen in the distal gastric body. No evidence of obstruction, inflammatory process or abnormal fluid collections. Vascular/Lymphatic: No pathologically enlarged lymph nodes. Nonocclusive thrombus in the main and right portal veins and splenic vein thrombosis again noted, as described above. Reproductive:  No mass or other significant abnormality. Other:  None. Musculoskeletal:  No suspicious bone lesions identified. IMPRESSION: No significant change in moderate acute pancreatitis. Stable  2.2 cm pseudocyst in splenic tail. No significant change in large subcapsular splenic hematoma. Nonocclusive thrombus in the main and right portal veins. Splenic vein thrombosis. Increased moderate left pleural effusion and left  lower lobe atelectasis. Electronically Signed   By: Marlaine Hind M.D.   On: 07/08/2022 10:02   DG Chest Port 1 View  Result Date: 07/07/2022 CLINICAL DATA:  Assess for nasogastric tube placement EXAM: PORTABLE CHEST 1 VIEW COMPARISON:  June 30, 2022 FINDINGS: The heart size and mediastinal contours are stable. Patchy consolidation of left lung base with left pleural effusion noted. Mild patchy consolidation of medial right lung base is noted. Nasogastric tube is identified with distal tip in the distal stomach. The visualized skeletal structures are stable. IMPRESSION: Nasogastric tube with distal tip in the distal stomach. Patchy consolidation of left lung base with left pleural effusion noted. Mild patchy consolidation of medial right lung base is noted. Electronically Signed   By: Abelardo Diesel M.D.   On: 07/07/2022 09:19   CT ABDOMEN PELVIS W CONTRAST  Result Date: 06/30/2022 CLINICAL DATA:  Abdominal pain, acute, nonlocalized diffuse pain, recent pancreatitis EXAM: CT ABDOMEN AND PELVIS WITH CONTRAST TECHNIQUE: Multidetector CT imaging of the abdomen and pelvis was performed using the standard protocol following bolus administration of intravenous contrast. RADIATION DOSE REDUCTION: This exam was performed according to the departmental dose-optimization program which includes automated exposure control, adjustment of the mA and/or kV according to patient size and/or use of iterative reconstruction technique. CONTRAST:  1103m OMNIPAQUE IOHEXOL 300 MG/ML  SOLN COMPARISON:  None Available. FINDINGS: Lower chest: Trace bilateral, left greater than right, pleural effusion. Associated passive atelectasis of left lower lobe. Hepatobiliary: The liver is enlarged measuring up to 19 cm. No focal liver abnormality. No gallstones or definite gallbladder wall thickening. Positive for pericholecystic fluid. No biliary dilatation. Pancreas: No focal lesion. Hazy pancreatic contour with associated peripancreatic  fat stranding and free fluid. Slightly decreased in size cystic lesion measuring 2 x 2 cm along the distal pancreatic body. Poorly visualized distal pancreatic tail likely due to cystic changes and edema with underlying nonenhancement of the pancreatic tail not fully excluded. Interval resolution of cystic lesion along the greater curvature of the stomach. No main pancreatic ductal dilatation. Spleen: Similar-appearing heterogeneous 14 x 8 cm perisplenic subcapsular lesion likely representing a hematoma. Adrenals/Urinary Tract: No focal lesion. Normal pancreatic contour. No surrounding inflammatory changes. No main pancreatic ductal dilatation. Stomach/Bowel: Stomach is within normal limits. No evidence of bowel wall thickening or dilatation. Appendix appears normal. Vascular/Lymphatic: No splenic artery aneurysm identified. Venous collaterals noted. No abdominal aorta or iliac aneurysm. Mild atherosclerotic plaque of the aorta and its branches. No abdominal, pelvic, or inguinal lymphadenopathy. Reproductive: Uterus and bilateral adnexa are unremarkable. Other: Small volume simple free fluid ascites. No intraperitoneal free gas. No organized fluid collection. Musculoskeletal: No abdominal wall hernia or abnormality. Soft tissue density along the anterior abdominal wall likely due to medication injection. No suspicious lytic or blastic osseous lesions. No acute displaced fracture. Multilevel degenerative changes of the spine. IMPRESSION: 1. Acute pancreatitis with interval decrease in size of a distal pancreatic body cystic lesion likely representing a pseudocyst. Interval resolution of cystic lesion along the greater curvature of the stomach. Component of necrotizing pancreatitis along the pancreatic tail is not fully excluded. 2. Similar-appearing heterogeneous 14 x 8 cm perisplenic subcapsular hematoma. 3. Nonspecific pericholecystic fluid with no definite CT findings acute cholecystitis. 4. Small volume simple  free fluid ascites. 5. Trace bilateral, left  greater than right, pleural effusion. 6. Mild hepatomegaly. 7. Venous collaterals. Electronically Signed   By: Iven Finn M.D.   On: 06/30/2022 22:45   DG Chest Port 1 View  Result Date: 06/30/2022 CLINICAL DATA:  Recent effusion with decreased lung sounds on the left EXAM: PORTABLE CHEST 1 VIEW COMPARISON:  Radiographs 06/13/2022 FINDINGS: Slight decrease in small left pleural effusion and associated atelectasis since 06/13/2022. Possible trace right pleural effusion. No pneumothorax. Stable cardiomediastinal silhouette. No acute osseous abnormality. IMPRESSION: Since 06/13/2022, decreased small left pleural effusion and associated atelectasis. Pneumonia is difficult to exclude. Electronically Signed   By: Placido Sou M.D.   On: 06/30/2022 18:26     Assessment  36 y.o. female with a history of  alcohol abuse complicated by alcoholic pancreatitis, HTN, Liddle syndrome, and recent hospitalization due to recurrent pancreatitis complicated by splenic hematoma and splenic vein thrombosis, presenting this admission with acute on chronic abdominal pain.    Acute on chronic pancreatitis: as she was unable to advance diet by mouth, NG tube was placed on 12/2 . She is tolerating tube feeds at goal since 0200 today. Clinically improved with abdominal pain and nausea. CT over the weekend with no significant change and stable pseudocyst in splenic tail. No significant change in splenic hematoma. Non-occlusive thrombus in main and right portal veins. Splenic vein thrombosis. Previously was on anticoagulation but discontinued due to hematoma.    Plan / Recommendations  Continue NG tube feeds Continue clear liquids Consider slow advancement to fulls later today or tomorrow PPI daily Continue supportive measures    LOS: 10 days    07/10/2022, 11:21 AM  Annitta Needs, PhD, ANP-BC [facility]

## 2022-07-10 NOTE — Progress Notes (Signed)
Increased pt's tube feeding by 10 mL every 6 hours and patient is now at 50 mL/hr and is tolerating it well. Pt has complained of little nausea. PRN Zofran given and no complaints have been made afterwards. Pt has complained of 10/10 pain every 2 hours. PRN Dilaudid given. New PIV started after reported pain and burning at insertion site.

## 2022-07-11 DIAGNOSIS — K861 Other chronic pancreatitis: Secondary | ICD-10-CM | POA: Diagnosis not present

## 2022-07-11 DIAGNOSIS — K859 Acute pancreatitis without necrosis or infection, unspecified: Secondary | ICD-10-CM | POA: Diagnosis not present

## 2022-07-11 LAB — BASIC METABOLIC PANEL
Anion gap: 5 (ref 5–15)
BUN: 6 mg/dL (ref 6–20)
CO2: 29 mmol/L (ref 22–32)
Calcium: 7.9 mg/dL — ABNORMAL LOW (ref 8.9–10.3)
Chloride: 103 mmol/L (ref 98–111)
Creatinine, Ser: 0.39 mg/dL — ABNORMAL LOW (ref 0.44–1.00)
GFR, Estimated: >60 mL/min (ref >60)
Glucose, Bld: 88 mg/dL (ref 70–99)
Potassium: 4 mmol/L (ref 3.5–5.1)
Sodium: 137 mmol/L (ref 135–145)

## 2022-07-11 LAB — GLUCOSE, CAPILLARY
Glucose-Capillary: 103 mg/dL — ABNORMAL HIGH (ref 70–99)
Glucose-Capillary: 130 mg/dL — ABNORMAL HIGH (ref 70–99)
Glucose-Capillary: 85 mg/dL (ref 70–99)
Glucose-Capillary: 91 mg/dL (ref 70–99)

## 2022-07-11 LAB — CBC
HCT: 33.6 % — ABNORMAL LOW (ref 36.0–46.0)
Hemoglobin: 10.5 g/dL — ABNORMAL LOW (ref 12.0–15.0)
MCH: 29.8 pg (ref 26.0–34.0)
MCHC: 31.3 g/dL (ref 30.0–36.0)
MCV: 95.5 fL (ref 80.0–100.0)
Platelets: 397 10*3/uL (ref 150–400)
RBC: 3.52 MIL/uL — ABNORMAL LOW (ref 3.87–5.11)
RDW: 17.5 % — ABNORMAL HIGH (ref 11.5–15.5)
WBC: 9 10*3/uL (ref 4.0–10.5)
nRBC: 0 % (ref 0.0–0.2)

## 2022-07-11 LAB — MAGNESIUM: Magnesium: 1.5 mg/dL — ABNORMAL LOW (ref 1.7–2.4)

## 2022-07-11 NOTE — Progress Notes (Signed)
PROGRESS NOTE    Jillian Shepherd  BSW:[id number] DOB: 1986/05/17 DOA: 06/30/2022 PCP: [name], MD  Subjective: The patient was seen and examined this morning, stating that she has had gas and bowel movement, nausea vomiting has improved Stating she has difficulty swallowing due to NG tube being in place  Will need to try clear liquid diet and advance it Requesting if NG tube could be discontinued today  Brief Narrative:    Jillian Shepherd is a  36 y.o. female with medical history significant of alcohol abuse, B12 deficiency, hypokalemia, Liddle syndrome, history of splenic hemorrhage with splenic subcapsular hematoma, presents to the ED with a chief complaint of abdominal pain.  She was readmitted with acute on chronic pancreatitis in the setting of possible alcohol use.  Her diet is being slowly advanced and she is currently tolerating tube feeds near her goal weight along with clear liquid diet.  She continues to have ongoing abdominal pain however and requires close monitoring and slow dietary advancement.  GI following.  Assessment & Plan:   Principal Problem:   Acute on chronic pancreatitis Active Problems:   Essential hypertension   Hypokalemia   Acute hypoxic respiratory failure (HCC)   Malnutrition of moderate degree   Diabetes mellitus type II, controlled (HCC)   Dehydration  Assessment and Plan:   Acute on chronic pancreatitis-slowly improving -Improving abdominal pain, improving nausea vomiting -Tolerating clear liquid diet   - Pt presented with abdominal pain with leukocytosis, thrombocytosis, CT scan showing acute pancreatitis with interval decrease in size of the distal pancreatic body cystic lesion that was likely a pseudocyst.  Component of necrotizing pancreatitis cannot be excluded.  Perisplenic subcapsular hematoma still present-hold anticoagulation - Patient finished Levaquin on November 12 - Flagyl and cefepime discontinued - Last lipid panel  shows triglycerides of 115 - No new medications that could have contributed to this acute flare - Previously thought to be alcoholic pancreatitis, but patient has not had a drink in 1 month per her report - Changed  dilaudid 11/27 with better control.  Continue clear liquid diet.  - Appreciate GI team recommendations. -NG tube placed by general surgery 12/2 and tube feedings are tolerated thus far  Will attempt to discontinue NG tube today 07/11/2022>>> advance diet slowly  -CT abdomen and pelvis performed on 12/3 with no significant new changes -Continue clear liquid diet and advance further as tolerated -will consider discontinuing NG tube today 07/11/2022   Diabetes mellitus type II, controlled ([place]) - Hold metformin - Sliding scale coverage for Now  - if BS less than 70 would add dextrose to IV fluid, continue to monitor - CLD     Malnutrition of moderate degree - Hypoalbuminemia at 3.2 - Patient has been on the liquid only diet for a month per her report - only able to tolerate clear liquids at this time.  - obtain nutrition team consult   Acute hypoxic respiratory failure-currently resolved -Much improved  - Likely related to pleural effusion - Pneumonia cannot be excluded on chest x-ray but no clinical symptoms - Patient has recently been on Levaquin and Omnicef plus doxycycline - Discontinue further cefepime - Pain medications could also be contributing to this hypoxia  - Continue to monitor   Essential hypertension - Continue amlodipine   Obesity -BMI 31.79     DVT prophylaxis: SCDs Code Status: Full Family Communication: None at bedside Disposition Plan:  Status is: Inpatient Remains inpatient appropriate because: Need for IV medications.   Consultants:  GI   Procedures:  None   Antimicrobials:  Anti-infectives (From admission, onward)    Start     Dose/Rate Route Frequency Ordered Stop   07/01/22 0600  metroNIDAZOLE (FLAGYL) IVPB 500 mg  Status:   Discontinued        500 mg 100 mL/hr over 60 Minutes Intravenous Every 12 hours 07/01/22 0314 07/04/22 1110   06/30/22 1830  ceFEPIme (MAXIPIME) 2 g in sodium chloride 0.9 % 100 mL IVPB  Status:  Discontinued        2 g 200 mL/hr over 30 Minutes Intravenous Every 8 hours 06/30/22 1815 07/04/22 1110   06/30/22 1815  ceFEPIme (MAXIPIME) 2 g in sodium chloride 0.9 % 100 mL IVPB  Status:  Discontinued        2 g 200 mL/hr over 30 Minutes Intravenous  Once 06/30/22 1802 06/30/22 1815   06/30/22 1815  metroNIDAZOLE (FLAGYL) IVPB 500 mg        500 mg 100 mL/hr over 60 Minutes Intravenous  Once 06/30/22 1802 06/30/22 1956       Objective: Vitals:   07/10/22 0914 07/10/22 1345 07/10/22 2014 07/11/22 0500  BP: 111/81 110/78 (!) 142/111 110/80  Pulse: 84 89 89 80  Resp:  '16 14 15  '$ Temp:  99.1 F (37.3 C) 98.6 F (37 C) 98 F (36.7 C)  TempSrc:  Oral Oral Oral  SpO2:  91% 93% 90%  Weight:      Height:        Intake/Output Summary (Last 24 hours) at 07/11/2022 1241 Last data filed at 07/11/2022 0940 Gross per 24 hour  Intake 3020.91 ml  Output --  Net 3020.91 ml   Filed Weights   06/30/22 1722 07/02/22 0436 07/03/22 2150  Weight: 65.8 kg 75 kg 81.4 kg       Physical Exam:   General:  AAO x 3,  cooperative, no distress;   HEENT:  Normocephalic, PERRL, otherwise with in Normal limits  NG tube in place  Neuro:  CNII-XII intact. , normal motor and sensation, reflexes intact   Lungs:   Clear to auscultation BL, Respirations unlabored,  No wheezes / crackles  Cardio:    S1/S2, RRR, No murmure, No Rubs or Gallops   Abdomen:  Soft, non-tender, bowel sounds active all four quadrants, no guarding or peritoneal signs.  Muscular  skeletal:  Limited exam -global generalized weaknesses - in bed, able to move all 4 extremities,   2+ pulses,  symmetric, No pitting edema  Skin:  Dry, warm to touch, negative for any Rashes,  Wounds: Please see nursing documentation          Data  Reviewed: I have personally reviewed following labs and imaging studies  CBC: Recent Labs  Lab 07/05/22 0317 07/06/22 0353 07/07/22 0344 07/08/22 0447 07/11/22 0452  WBC 6.9 7.2 6.7 6.5 9.0  NEUTROABS 4.0 3.8  --   --   --   HGB 9.8* 10.1* 11.0* 11.0* 10.5*  HCT 30.9* 33.4* 35.4* 35.6* 33.6*  MCV 96.3 97.9 95.7 96.0 95.5  PLT 359 382 400 258 947   Basic Metabolic Panel: Recent Labs  Lab 07/07/22 0344 07/08/22 0447 07/09/22 0504 07/10/22 0453 07/11/22 0452  NA 136 136 136 137 137  K 4.0 4.8 3.7 3.7 4.0  CL 98 102 100 102 103  CO2 '31 27 30 29 29  '$ GLUCOSE 80 92 92 112* 88  BUN <5* <5* <5* <5* 6  CREATININE 0.36* 0.43* 0.38* 0.34* 0.39*  CALCIUM 8.6* 8.5* 8.3* 8.1* 7.9*  MG 1.7 1.7 1.4* 1.8 1.5*   GFR: Estimated Creatinine Clearance: 97.3 mL/min (A) (by C-G formula based on SCr of 0.39 mg/dL (L)). Liver Function Tests: Recent Labs  Lab 07/05/22 0317 07/06/22 0353 07/07/22 0344 07/08/22 0447 07/09/22 0504  AST 14* 13* 14* 23 14*  ALT '7 6 7 11 6  '$ ALKPHOS 63 63 62 59 63  BILITOT 0.7 0.8 0.4 0.8 0.5  PROT 6.0* 6.2* 6.7 6.8 6.4*  ALBUMIN 2.2* 2.2* 2.3* 2.4* 2.2*    Recent Labs  Lab 07/10/22 0731 07/10/22 1636 07/11/22 0012 07/11/22 0748 07/11/22 1121  GLUCAP 115* 141* 91 130* 103*     No results found for this or any previous visit (from the past 240 hour(s)).     Radiology Studies: No results found.      Scheduled Meds:  amLODipine  10 mg Per Tube Daily   Chlorhexidine Gluconate Cloth  6 each Topical Daily   docusate  200 mg Per Tube Daily   feeding supplement (PROSource TF20)  60 mL Per Tube Daily   folic acid  1 mg Per Tube Daily   pantoprazole (PROTONIX) IV  40 mg Intravenous Q24H   Continuous Infusions:  feeding supplement (VITAL 1.5 CAL) 1,000 mL (07/11/22 0518)   lactated ringers 75 mL/hr at 07/11/22 0940     LOS: 11 days    Time spent: 35 minutes    Shantele Reller A Lindbergh Winkles, DO [facility]  If 7PM-7AM, please contact  night-coverage [email or web address] 07/11/2022, 12:41 PM

## 2022-07-11 NOTE — Progress Notes (Signed)
NG tube pulled and feed stopped per Dr. Roger Shelter, ate 100% of diet no nausea 1 hour plus after meal.

## 2022-07-11 NOTE — Progress Notes (Signed)
  Subjective: Patient states she is feeling better, no nausea. she notes some tenderness in epigastric region when pressing but no abdominal pain. she is ready to have NG tube out. Notes she has done well with clear liquids, diet was not advanced to fulls yesterday as recommended. 1 BM this morning that was normal.   Objective: Vital signs in last 24 hours: Temp:  [98 F (36.7 C)-99.1 F (37.3 C)] 98 F (36.7 C) (12/06 0500) Pulse Rate:  [80-89] 80 (12/06 0500) Resp:  [14-16] 15 (12/06 0500) BP: (110-142)/(78-111) 110/80 (12/06 0500) SpO2:  [90 %-93 %] 90 % (12/06 0500) Last BM Date : 07/01/22 General:   Alert and oriented, pleasant Head:  Normocephalic and atraumatic. Eyes:  No icterus, sclera clear. Conjuctiva pink.  Mouth:  Without lesions, mucosa pink and moist.  Heart:  S1, S2 present, no murmurs noted.  Lungs: Clear to auscultation bilaterally, without wheezing, rales, or rhonchi.  Abdomen:  Bowel sounds present, soft, non-distended. Mild TTP of epigastric region.  No HSM or hernias noted. No rebound or guarding. No masses appreciated  Msk:  Symmetrical without gross deformities. Normal posture. Pulses:  Normal pulses noted. Extremities:  Without clubbing or edema. Neurologic:  Alert and  oriented x4;  grossly normal neurologically. Skin:  Warm and dry, intact without significant lesions.  Psych:  Alert and cooperative. Normal mood and affect.  Intake/Output from previous day: 12/05 0701 - 12/06 0700 In: 2014.8 [P.O.:720; I.V.:407.1; NG/GT:887.7] Out: -  Intake/Output this shift: No intake/output data recorded.  Lab Results: Recent Labs    07/11/22 0452  WBC 9.0  HGB 10.5*  HCT 33.6*  PLT 397   BMET Recent Labs    07/09/22 0504 07/10/22 0453 07/11/22 0452  NA 136 137 137  K 3.7 3.7 4.0  CL 100 102 103  CO2 '30 29 29  '$ GLUCOSE 92 112* 88  BUN <5* <5* 6  CREATININE 0.38* 0.34* 0.39*  CALCIUM 8.3* 8.1* 7.9*   LFT Recent Labs    07/09/22 0504  PROT 6.4*   ALBUMIN 2.2*  AST 14*  ALT 6  ALKPHOS 71  BILITOT 0.5    Assessment: Jillian Shepherd is a 36 y.o. female with a history of  ETOH abuse complicated by alcoholic pancreatitis, HTN, Liddle syndrome, and recent hospitalization due to recurrent pancreatitis complicated by splenic hematoma and splenic vein thrombosis, presenting this admission with acute on chronic abdominal pain.   Acute on chronic pancreatitis: NG tube placed on 12/2 due to inability to advance diet. Currently tolerating 58m/hour with improvement of abdominal pain and resolution of nausea. CT Monday with no significant change and stable pseudocyst in splenic tail. No significant change in splenic hematoma. Non-occlusive thrombus in main and right portal veins. Splenic vein thrombosis. Previously was on anticoagulation but discontinued due to hematoma.  Tolerating clear liquids, will advance to full liquids today to see how she does.     Plan: Continue slow advancement of diet, full liquids today  Continue colace daily PPI daily Continue supportive measures  maintain NG tube, consider removal once able to tolerate diet advancement   LOS: 11 days    07/11/2022, 9:02 AM   Avi Kerschner L. CAlver Sorrow MSN, APRN, AGNP-C Adult-Gerontology Nurse Practitioner [facility]

## 2022-07-11 NOTE — Progress Notes (Signed)
Patient slept on and off throughout this shift, she did complain of pain often, patient received prn dilaudid consistently as scheduled. This morning, patient complained of nausea and pain, she is asking when her NG tube can be removed and stated "I can't take this anymore".

## 2022-07-12 DIAGNOSIS — K859 Acute pancreatitis without necrosis or infection, unspecified: Secondary | ICD-10-CM | POA: Diagnosis not present

## 2022-07-12 DIAGNOSIS — K861 Other chronic pancreatitis: Secondary | ICD-10-CM | POA: Diagnosis not present

## 2022-07-12 LAB — GLUCOSE, CAPILLARY
Glucose-Capillary: 108 mg/dL — ABNORMAL HIGH (ref 70–99)
Glucose-Capillary: 78 mg/dL (ref 70–99)
Glucose-Capillary: 82 mg/dL (ref 70–99)
Glucose-Capillary: 84 mg/dL (ref 70–99)

## 2022-07-12 MED ORDER — OXYCODONE HCL 5 MG PO TABS
5.0000 mg | ORAL_TABLET | Freq: Four times a day (QID) | ORAL | Status: DC | PRN
  Administered 2022-07-12 – 2022-07-13 (×3): 5 mg via ORAL
  Filled 2022-07-12 (×3): qty 1

## 2022-07-12 MED ORDER — HYDROMORPHONE HCL 1 MG/ML IJ SOLN: 0.5000 mg | INTRAMUSCULAR | Status: DC | PRN

## 2022-07-12 MED ORDER — FOLIC ACID 1 MG PO TABS
1.0000 mg | ORAL_TABLET | Freq: Every day | ORAL | Status: DC
  Administered 2022-07-12 – 2022-07-13 (×2): 1 mg via ORAL
  Filled 2022-07-12 (×2): qty 1

## 2022-07-12 MED ORDER — PANTOPRAZOLE SODIUM 40 MG PO TBEC
40.0000 mg | DELAYED_RELEASE_TABLET | Freq: Every day | ORAL | Status: DC
  Administered 2022-07-12 – 2022-07-13 (×2): 40 mg via ORAL
  Filled 2022-07-12 (×2): qty 1

## 2022-07-12 MED ORDER — HYDROMORPHONE HCL 1 MG/ML IJ SOLN
0.5000 mg | INTRAMUSCULAR | Status: DC | PRN
  Administered 2022-07-12 – 2022-07-13 (×4): 0.5 mg via INTRAVENOUS
  Filled 2022-07-12 (×4): qty 0.5

## 2022-07-12 NOTE — Progress Notes (Signed)
PROGRESS NOTE    Jillian Shepherd  IEP:[id number] DOB: [birth year] DOA: 06/30/2022 PCP: [name], MD  Subjective: Seen and examined this morning, stable no acute distress, tolerating clear liquid, advance to soft diet Reporting improved abdominal pain, reporting of gas and bowel movement  NG tube has been discontinued yesterday, no further nausea vomiting reported  Brief Narrative:    Jillian Shepherd is a  36 y.o. female with medical history significant of alcohol abuse, B12 deficiency, hypokalemia, Liddle syndrome, history of splenic hemorrhage with splenic subcapsular hematoma, presents to the ED with a chief complaint of abdominal pain.  She was readmitted with acute on chronic pancreatitis in the setting of possible alcohol use.  Her diet is being slowly advanced and she is currently tolerating tube feeds near her goal weight along with clear liquid diet.  She continues to have ongoing abdominal pain however and requires close monitoring and slow dietary advancement.  GI following.  Assessment & Plan:   Principal Problem:   Acute on chronic pancreatitis Active Problems:   Essential hypertension   Hypokalemia   Acute hypoxic respiratory failure (HCC)   Malnutrition of moderate degree   Diabetes mellitus type II, controlled ([place])   Dehydration  Assessment and Plan:   Acute on chronic pancreatitis-slowly improving -NG tube was discontinued yesterday, much improved nausea vomiting, abdominal pain (although on Dilaudid as needed every 2 hours) -Tolerating clear liquid diet > >> advancing diet today   - Pt presented with abdominal pain with leukocytosis, thrombocytosis, CT scan showing acute pancreatitis with interval decrease in size of the distal pancreatic body cystic lesion that was likely a pseudocyst.  Component of necrotizing pancreatitis cannot be excluded.  Perisplenic subcapsular hematoma still present-hold anticoagulation - Patient finished Levaquin on November  12 - Flagyl and cefepime discontinued - Last lipid panel shows triglycerides of 115 - No new medications that could have contributed to this acute flare - Previously thought to be alcoholic pancreatitis, but patient has not had a drink in 1 month per her report - Changed  dilaudid 11/27 with better control.  Continue clear liquid diet.  -GI following -NG tube placed by general surgery 12/2 and tube feedings are tolerated thus far>> discontinued 07/11/2022  Will attempt to discontinue NG tube today 07/11/2022>>> advance diet slowly  -CT abdomen and pelvis performed on 12/3 with no significant new changes -Continue clear liquid diet and advance further as tolerated -will consider discontinuing NG tube today 07/11/2022   Diabetes mellitus type II, controlled ([place]) - Hold metformin - Sliding scale coverage for Now  - if BS less than 70 would add dextrose to IV fluid, continue to monitor - CLD     Malnutrition of moderate degree - Hypoalbuminemia at 3.2 - Patient has been on the liquid only diet for a month per her report - only able to tolerate clear liquids>> advancing slowly - Nutrition was consulted, was following, tube feed has been discontinued   Acute hypoxic respiratory failure-currently resolved -Much improved  - Likely related to pleural effusion - Pneumonia cannot be excluded on chest x-ray but no clinical symptoms - Patient has recently been on Levaquin and Omnicef plus doxycycline - Discontinue further cefepime - Pain medications could also be contributing to this hypoxia  - Continue to monitor   Essential hypertension - Continue amlodipine   Obesity -BMI 31.79     DVT prophylaxis: SCDs Code Status: Full Family Communication: None at bedside Disposition Plan:  Status is: Inpatient Remains inpatient appropriate because: Need  for IV medications.   Consultants:  GI   Procedures:  None   Antimicrobials:  Anti-infectives (From admission, onward)    Start      Dose/Rate Route Frequency Ordered Stop   07/01/22 0600  metroNIDAZOLE (FLAGYL) IVPB 500 mg  Status:  Discontinued        500 mg 100 mL/hr over 60 Minutes Intravenous Every 12 hours 07/01/22 0314 07/04/22 1110   06/30/22 1830  ceFEPIme (MAXIPIME) 2 g in sodium chloride 0.9 % 100 mL IVPB  Status:  Discontinued        2 g 200 mL/hr over 30 Minutes Intravenous Every 8 hours 06/30/22 1815 07/04/22 1110   06/30/22 1815  ceFEPIme (MAXIPIME) 2 g in sodium chloride 0.9 % 100 mL IVPB  Status:  Discontinued        2 g 200 mL/hr over 30 Minutes Intravenous  Once 06/30/22 1802 06/30/22 1815   06/30/22 1815  metroNIDAZOLE (FLAGYL) IVPB 500 mg        500 mg 100 mL/hr over 60 Minutes Intravenous  Once 06/30/22 1802 06/30/22 1956       Objective: Vitals:   07/11/22 0500 07/11/22 1617 07/11/22 2313 07/12/22 0411  BP: 110/80 115/75 105/70 109/81  Pulse: 80 79 85 92  Resp: '15 20 20 20  '$ Temp: 98 F (36.7 C) 98.5 F (36.9 C) 98.1 F (36.7 C) 97.9 F (36.6 C)  TempSrc: Oral Oral Oral Oral  SpO2: 90% 96% 92% 94%  Weight:      Height:        Intake/Output Summary (Last 24 hours) at 07/12/2022 1114 Last data filed at 07/12/2022 0500 Gross per 24 hour  Intake 1626.57 ml  Output --  Net 1626.57 ml   Filed Weights   06/30/22 1722 07/02/22 0436 07/03/22 2150  Weight: 65.8 kg 75 kg 81.4 kg       Physical Exam:   General:  AAO x 3,  cooperative, no distress;   HEENT:  Normocephalic, PERRL, otherwise with in Normal limits   Neuro:  CNII-XII intact. , normal motor and sensation, reflexes intact   Lungs:   Clear to auscultation BL, Respirations unlabored,  No wheezes / crackles  Cardio:    S1/S2, RRR, No murmure, No Rubs or Gallops   Abdomen:  Soft, non-tender, bowel sounds active all four quadrants, no guarding or peritoneal signs.  Muscular  skeletal:  Limited exam -global generalized weaknesses - in bed, able to move all 4 extremities,   2+ pulses,  symmetric, No pitting edema  Skin:   Dry, warm to touch, negative for any Rashes,  Wounds: Please see nursing documentation               Data Reviewed: I have personally reviewed following labs and imaging studies  CBC: Recent Labs  Lab 07/06/22 0353 07/07/22 0344 07/08/22 0447 07/11/22 0452  WBC 7.2 6.7 6.5 9.0  NEUTROABS 3.8  --   --   --   HGB 10.1* 11.0* 11.0* 10.5*  HCT 33.4* 35.4* 35.6* 33.6*  MCV 97.9 95.7 96.0 95.5  PLT 382 400 258 409   Basic Metabolic Panel: Recent Labs  Lab 07/07/22 0344 07/08/22 0447 07/09/22 0504 07/10/22 0453 07/11/22 0452  NA 136 136 136 137 137  K 4.0 4.8 3.7 3.7 4.0  CL 98 102 100 102 103  CO2 '31 27 30 29 29  '$ GLUCOSE 80 92 92 112* 88  BUN <5* <5* <5* <5* 6  CREATININE 0.36* 0.43* 0.38* 0.34*  0.39*  CALCIUM 8.6* 8.5* 8.3* 8.1* 7.9*  MG 1.7 1.7 1.4* 1.8 1.5*   GFR: Estimated Creatinine Clearance: 97.3 mL/min (A) (by C-G formula based on SCr of 0.39 mg/dL (L)). Liver Function Tests: Recent Labs  Lab 07/06/22 0353 07/07/22 0344 07/08/22 0447 07/09/22 0504  AST 13* 14* 23 14*  ALT '6 7 11 6  '$ ALKPHOS 63 62 59 63  BILITOT 0.8 0.4 0.8 0.5  PROT 6.2* 6.7 6.8 6.4*  ALBUMIN 2.2* 2.3* 2.4* 2.2*    Recent Labs  Lab 07/11/22 0748 07/11/22 1121 07/11/22 1700 07/12/22 0038 07/12/22 0734  GLUCAP 130* 103* 85 82 84     No results found for this or any previous visit (from the past 240 hour(s)).     Radiology Studies: No results found.      Scheduled Meds:  amLODipine  10 mg Per Tube Daily   Chlorhexidine Gluconate Cloth  6 each Topical Daily   docusate  200 mg Per Tube Daily   feeding supplement (PROSource TF20)  60 mL Per Tube Daily   folic acid  1 mg Oral Daily   pantoprazole  40 mg Oral Daily   Continuous Infusions:  lactated ringers Stopped (07/11/22 1258)     LOS: 12 days    Time spent: 35 minutes    Deatra James, MD [facility]  If 7PM-7AM, please contact night-coverage [email or web address] 07/12/2022, 11:14 AM

## 2022-07-12 NOTE — Progress Notes (Signed)
PT Screen  Patient Details Name: Jillian Shepherd MRN: [id number] DOB: 1985-10-19  Reason Eval/Treat Not Completed: PT screened, no needs identified, will sign off   Upon therapist entry, patient supine with laptop.  When asked about patient's functional status, patient reported that she has been already ambulating in her room multiple times throughout the day today.  Patient reports that she has been here previously and ambulated around the halls.  When asked if patient was at functional baseline patient reported "yes I am at my baseline."  Based upon these responses, no acute PT needs indicated.  PT to sign off.    07/12/22 1328  PT Visit Information  Last PT Received On 07/12/22  Assistance Needed  (Patient independent, reporting has already been walking around the room independently)  Reason Eval/Treat Not Completed PT screened, no needs identified, will sign off  History of Present Illness Jillian Shepherd is a 36 y.o. female with medical history significant of alcohol abuse, B12 deficiency, hypokalemia, Liddle syndrome, history of splenic hemorrhage with splenic subcapsular hematoma, presents to the ED with a chief complaint of abdominal pain.  Patient was discharged about 2 weeks ago.  She reports that her pain was never improved since discharge.  It was much worse today.  She describes it as a throbbing pain in her epigastric region radiating around to her back, and down midline abdomen.  It is sharp.  Sometimes it feels like squeezing.  At its worst is a 10 out of 10, after 100 mcg of fentanyl and says 7-8 out of 10.  Patient reports she had nausea with nonbloody emesis x4.  Her last normal liquid meal was at noon.  Her last normal bowel movement was this morning.  She reports that the color is bright yellow, but otherwise it was normal bowel movement for her.  It was nonbloody.  Patient reports she has been on a strict full liquid, but as she describes it it sounds more like a soft diet  with applesauce, yogurt, Ensure shakes.  Patient was discharged on Levaquin previously she reports she finished that on the 12th.  Patient reports that she has had some pleuritic chest pain on the left.  That is consistent with the size of her pleural effusion is noted.  Patient reports that she has had cough with thick phlegm.  She quit smoking about 1 month ago.  Patient has no other complaints at this time.  As stated above patient does not smoke.  She quit drinking about 1 month ago.  She does not do illicit drugs.  She is vaccinated for COVID.  Patient is full code.  In context of patient's life-she recently lost her job due to missing work for pancreatitis per her report.   Wonda Olds PT, DPT Physical Therapist with [address] [facility] 336 [phone] office

## 2022-07-12 NOTE — Progress Notes (Signed)
Gastroenterology Progress Note   Referring Provider: No ref. provider found Primary Care Physician:  [name], MD Primary Gastroenterologist:  Dr. Marius Ditch ([facility])  Patient ID: Jillian Shepherd; [id number]; February 01, 1986   Subjective:    Feeling better. NG removed yesterday. Tolerating full liquids.   Objective:   Vital signs in last 24 hours: Temp:  [97.9 F (36.6 C)-98.5 F (36.9 C)] 97.9 F (36.6 C) (12/07 0411) Pulse Rate:  [79-92] 92 (12/07 0411) Resp:  [20] 20 (12/07 0411) BP: (105-115)/(70-81) 109/81 (12/07 0411) SpO2:  [92 %-96 %] 94 % (12/07 0411) Last BM Date : 07/12/22 General:   Alert,  Well-developed, well-nourished, pleasant and cooperative in NAD Head:  Normocephalic and atraumatic. Eyes:  Sclera clear, no icterus.  Abdomen:  Soft, nontender and nondistended. Normal bowel sounds, without guarding, and without rebound.   Extremities:  Without clubbing, deformity or edema. Neurologic:  Alert and  oriented x4;  grossly normal neurologically. Skin:  Intact without significant lesions or rashes. Psych:  Alert and cooperative. Normal mood and affect.  Intake/Output from previous day: 12/06 0701 - 12/07 0700 In: 3352.7 [P.O.:1080; I.V.:1604.2; NG/GT:668.5] Out: -  Intake/Output this shift: Total I/O In: 240 [P.O.:240] Out: -   Lab Results: CBC Recent Labs    07/11/22 0452  WBC 9.0  HGB 10.5*  HCT 33.6*  MCV 95.5  PLT 397   BMET Recent Labs    07/10/22 0453 07/11/22 0452  NA 137 137  K 3.7 4.0  CL 102 103  CO2 29 29  GLUCOSE 112* 88  BUN <5* 6  CREATININE 0.34* 0.39*  CALCIUM 8.1* 7.9*   LFTs No results for input(s): "BILITOT", "BILIDIR", "IBILI", "ALKPHOS", "AST", "ALT", "PROT", "ALBUMIN" in the last 72 hours. No results for input(s): "LIPASE" in the last 72 hours. PT/INR No results for input(s): "LABPROT", "INR" in the last 72 hours.       Imaging Studies: CT ABDOMEN PELVIS W CONTRAST  Result Date: 07/08/2022 CLINICAL  DATA:  Follow-up acute pancreatitis and splenic hematoma. EXAM: CT ABDOMEN AND PELVIS WITH CONTRAST TECHNIQUE: Multidetector CT imaging of the abdomen and pelvis was performed using the standard protocol following bolus administration of intravenous contrast. RADIATION DOSE REDUCTION: This exam was performed according to the departmental dose-optimization program which includes automated exposure control, adjustment of the mA and/or kV according to patient size and/or use of iterative reconstruction technique. CONTRAST:  150m OMNIPAQUE IOHEXOL 300 MG/ML  SOLN COMPARISON:  06/30/2022 FINDINGS: Lower Chest: Moderate left pleural effusion and left lower lobe atelectasis show mild increase since previous study. Hepatobiliary: No hepatic masses identified. Nonocclusive thrombus is seen in the main and right portal veins. Gallbladder is unremarkable. No evidence of biliary ductal dilatation. Pancreas: Moderate acute pancreatitis shows no significant change. A 2.2 x 1.9 cm fluid collection in the splenic tail is also unchanged and consistent with a small pseudocyst. Spleen: Large subcapsular splenic hematoma measures 14.3 x 10.5 cm, without significant change in size compared to prior study. Splenic vein thrombosis also noted with venous collateral seen in the gastrosplenic ligament. Adrenals/Urinary Tract: No suspicious masses identified. No evidence of ureteral calculi or hydronephrosis. Stomach/Bowel: Nasogastric tube tip is seen in the distal gastric body. No evidence of obstruction, inflammatory process or abnormal fluid collections. Vascular/Lymphatic: No pathologically enlarged lymph nodes. Nonocclusive thrombus in the main and right portal veins and splenic vein thrombosis again noted, as described above. Reproductive:  No mass or other significant abnormality. Other:  None. Musculoskeletal:  No suspicious bone  lesions identified. IMPRESSION: No significant change in moderate acute pancreatitis. Stable 2.2 cm  pseudocyst in splenic tail. No significant change in large subcapsular splenic hematoma. Nonocclusive thrombus in the main and right portal veins. Splenic vein thrombosis. Increased moderate left pleural effusion and left lower lobe atelectasis. Electronically Signed   By: Marlaine Hind M.D.   On: 07/08/2022 10:02   DG Chest Port 1 View  Result Date: 07/07/2022 CLINICAL DATA:  Assess for nasogastric tube placement EXAM: PORTABLE CHEST 1 VIEW COMPARISON:  June 30, 2022 FINDINGS: The heart size and mediastinal contours are stable. Patchy consolidation of left lung base with left pleural effusion noted. Mild patchy consolidation of medial right lung base is noted. Nasogastric tube is identified with distal tip in the distal stomach. The visualized skeletal structures are stable. IMPRESSION: Nasogastric tube with distal tip in the distal stomach. Patchy consolidation of left lung base with left pleural effusion noted. Mild patchy consolidation of medial right lung base is noted. Electronically Signed   By: Abelardo Diesel M.D.   On: 07/07/2022 09:19   CT ABDOMEN PELVIS W CONTRAST  Result Date: 06/30/2022 CLINICAL DATA:  Abdominal pain, acute, nonlocalized diffuse pain, recent pancreatitis EXAM: CT ABDOMEN AND PELVIS WITH CONTRAST TECHNIQUE: Multidetector CT imaging of the abdomen and pelvis was performed using the standard protocol following bolus administration of intravenous contrast. RADIATION DOSE REDUCTION: This exam was performed according to the departmental dose-optimization program which includes automated exposure control, adjustment of the mA and/or kV according to patient size and/or use of iterative reconstruction technique. CONTRAST:  181m OMNIPAQUE IOHEXOL 300 MG/ML  SOLN COMPARISON:  None Available. FINDINGS: Lower chest: Trace bilateral, left greater than right, pleural effusion. Associated passive atelectasis of left lower lobe. Hepatobiliary: The liver is enlarged measuring up to 19 cm. No  focal liver abnormality. No gallstones or definite gallbladder wall thickening. Positive for pericholecystic fluid. No biliary dilatation. Pancreas: No focal lesion. Hazy pancreatic contour with associated peripancreatic fat stranding and free fluid. Slightly decreased in size cystic lesion measuring 2 x 2 cm along the distal pancreatic body. Poorly visualized distal pancreatic tail likely due to cystic changes and edema with underlying nonenhancement of the pancreatic tail not fully excluded. Interval resolution of cystic lesion along the greater curvature of the stomach. No main pancreatic ductal dilatation. Spleen: Similar-appearing heterogeneous 14 x 8 cm perisplenic subcapsular lesion likely representing a hematoma. Adrenals/Urinary Tract: No focal lesion. Normal pancreatic contour. No surrounding inflammatory changes. No main pancreatic ductal dilatation. Stomach/Bowel: Stomach is within normal limits. No evidence of bowel wall thickening or dilatation. Appendix appears normal. Vascular/Lymphatic: No splenic artery aneurysm identified. Venous collaterals noted. No abdominal aorta or iliac aneurysm. Mild atherosclerotic plaque of the aorta and its branches. No abdominal, pelvic, or inguinal lymphadenopathy. Reproductive: Uterus and bilateral adnexa are unremarkable. Other: Small volume simple free fluid ascites. No intraperitoneal free gas. No organized fluid collection. Musculoskeletal: No abdominal wall hernia or abnormality. Soft tissue density along the anterior abdominal wall likely due to medication injection. No suspicious lytic or blastic osseous lesions. No acute displaced fracture. Multilevel degenerative changes of the spine. IMPRESSION: 1. Acute pancreatitis with interval decrease in size of a distal pancreatic body cystic lesion likely representing a pseudocyst. Interval resolution of cystic lesion along the greater curvature of the stomach. Component of necrotizing pancreatitis along the  pancreatic tail is not fully excluded. 2. Similar-appearing heterogeneous 14 x 8 cm perisplenic subcapsular hematoma. 3. Nonspecific pericholecystic fluid with no definite CT findings acute cholecystitis.  4. Small volume simple free fluid ascites. 5. Trace bilateral, left greater than right, pleural effusion. 6. Mild hepatomegaly. 7. Venous collaterals. Electronically Signed   By: Iven Finn M.D.   On: 06/30/2022 22:45   DG Chest Port 1 View  Result Date: 06/30/2022 CLINICAL DATA:  Recent effusion with decreased lung sounds on the left EXAM: PORTABLE CHEST 1 VIEW COMPARISON:  Radiographs 06/13/2022 FINDINGS: Slight decrease in small left pleural effusion and associated atelectasis since 06/13/2022. Possible trace right pleural effusion. No pneumothorax. Stable cardiomediastinal silhouette. No acute osseous abnormality. IMPRESSION: Since 06/13/2022, decreased small left pleural effusion and associated atelectasis. Pneumonia is difficult to exclude. Electronically Signed   By: Placido Sou M.D.   On: 06/30/2022 18:26   DG CHEST PORT 1 VIEW  Result Date: 06/13/2022 CLINICAL DATA:  Left pleural effusion vision. EXAM: PORTABLE CHEST 1 VIEW COMPARISON:  Radiograph earlier today. FINDINGS: The previous pigtail catheter in the left hemithorax is no longer seen. Similar size left pleural effusion and basilar opacity from earlier today. No visualized pneumothorax. Overall low lung volumes without focal right lung airspace disease. Stable heart size and mediastinal contours. IMPRESSION: Removal of pigtail catheter from the left hemithorax. Similar size left pleural effusion and basilar opacity from earlier today. No visualized pneumothorax. Electronically Signed   By: Keith Rake M.D.   On: 06/13/2022 15:54   DG Chest Port 1 View  Result Date: 06/13/2022 CLINICAL DATA:  Chest tube EXAM: PORTABLE CHEST 1 VIEW COMPARISON:  Portable exam 0632 hours compared to 06/12/2022 FINDINGS: Pigtail LEFT  thoracostomy tube again seen. Enlargement of cardiac silhouette. Mediastinal contours and pulmonary vascularity normal. Persistent LEFT pleural effusion and bibasilar atelectasis. Persistent dense opacity of LEFT lower lobe may represent atelectasis or pneumonia. No pneumothorax or acute osseous findings. IMPRESSION: Bibasilar atelectasis and LEFT pleural effusion with persistent LEFT lower lobe opacity, question atelectasis versus pneumonia. Electronically Signed   By: Lavonia Dana M.D.   On: 06/13/2022 08:47  [2 weeks]  Assessment:   Jillian Shepherd is a 36 y.o. female with a history of ETOH abuse complicated by alcoholic pancreatitis, HTN, Liddle syndrome, and recent hospitalization due to recurrent pancreatitis complicated by splenic hematoma and splenic vein thrombosis, presenting this admission with acute on chronic abdominal pain.    Acute on chronic pancreatitis: NG tube discontinued yesterday. Tolerating full liquids.  CT Monday with no significant change and stable pseudocyst in splenic tail. No significant change in splenic hematoma. Non-occlusive thrombus in main and right portal veins. Splenic vein thrombosis. Previously was on anticoagulation but discontinued due to hematoma.     Plan:   PPI daily. Continue to slowly advance diet to low fat as tolerated. Complete etoh cessation.   Follow up with [facility] as outpatient.    LOS: 12 days   Laureen Ochs. Bernarda Caffey [facility] [phone] 12/7/20231:38 PM

## 2022-07-12 NOTE — Progress Notes (Signed)
Patient rested well throughout this shift, she was able to space out her pain medication more this shift. She does not complain of any nausea at this time.

## 2022-07-13 LAB — GLUCOSE, CAPILLARY: Glucose-Capillary: 77 mg/dL (ref 70–99)

## 2022-07-13 MED ORDER — MELATONIN 3 MG PO TABS: 6.0000 mg | ORAL_TABLET | Freq: Every day | ORAL | 0 refills | Status: AC

## 2022-07-13 MED ORDER — ONDANSETRON HCL 4 MG PO TABS: 4.0000 mg | ORAL_TABLET | Freq: Four times a day (QID) | ORAL | 0 refills | Status: DC | PRN

## 2022-07-13 MED ORDER — MELATONIN 3 MG PO TABS
6.0000 mg | ORAL_TABLET | Freq: Every day | ORAL | Status: DC
  Administered 2022-07-13: 6 mg via ORAL
  Filled 2022-07-13: qty 2

## 2022-07-13 MED ORDER — MENTHOL 3 MG MT LOZG: 1.0000 | LOZENGE | OROMUCOSAL | 12 refills | Status: DC | PRN

## 2022-07-13 MED ORDER — OXYCODONE HCL 5 MG PO TABS: 5.0000 mg | ORAL_TABLET | Freq: Four times a day (QID) | ORAL | 0 refills | Status: DC | PRN

## 2022-07-13 MED ORDER — PANTOPRAZOLE SODIUM 40 MG PO TBEC: 40.0000 mg | DELAYED_RELEASE_TABLET | Freq: Every day | ORAL | 0 refills | Status: DC

## 2022-07-13 NOTE — Discharge Summary (Signed)
Physician Discharge Summary   Patient: Jillian Shepherd MRN: [id number] DOB: 1986/05/23  Admit date:     06/30/2022  Discharge date: 07/13/22  Discharge Physician: Deatra James   PCP: [name], MD   Recommendations at discharge:   Continue Protonix, and as needed Zofran Advance diet to low fat as tolerated.  Soft diet, Complete alcohol cessation.   Follow up with [facility] as outpatien Follow-up with PCP within 1-2 weeks  Discharge Diagnoses: Principal Problem:   Acute on chronic pancreatitis Active Problems:   Essential hypertension   Hypokalemia   Acute hypoxic respiratory failure (HCC)   Malnutrition of moderate degree   Diabetes mellitus type II, controlled ([place])   Dehydration  Resolved Problems:   * No resolved hospital problems. *  Hospital Course: Jillian Shepherd is a 36 y.o. female with medical history significant of alcohol abuse, B12 deficiency, hypokalemia, Liddle syndrome, history of splenic hemorrhage with splenic subcapsular hematoma, presents to the ED with a chief complaint of abdominal pain.  Patient was discharged about 2 weeks ago.  She reports that her pain was never improved since discharge.  It was much worse today.  She describes it as a throbbing pain in her epigastric region radiating around to her back, and down midline abdomen.  It is sharp.  Sometimes it feels like squeezing.  At its worst is a 10 out of 10, after 100 mcg of fentanyl and says 7-8 out of 10.  Patient reports she had nausea with nonbloody emesis x4.  Her last normal liquid meal was at noon.  Her last normal bowel movement was this morning.  She reports that the color is bright yellow, but otherwise it was normal bowel movement for her.  It was nonbloody.  Patient reports she has been on a strict full liquid, but as she describes it it sounds more like a soft diet with applesauce, yogurt, Ensure shakes.  Patient was discharged on Levaquin previously she reports she finished that  on the 12th.  Patient reports that she has had some pleuritic chest pain on the left.  That is consistent with the size of her pleural effusion is noted.  Patient reports that she has had cough with thick phlegm.  She quit smoking about 1 month ago.  Patient has no other complaints at this time. As stated above patient does not smoke.  She quit drinking about 1 month ago.  She does not do illicit drugs.  She is vaccinated for COVID.  Patient is full code. In context of patient's life-she recently lost her job due to missing work for pancreatitis per her report.  Assessment and Plan: *Acute on chronic pancreatitis- with slow improvement  -NG tube was discontinued yesterday, much improved nausea vomiting, abdominal pain (although on Dilaudid as needed every 2 hours) -Tolerating clear liquid diet > >> advancing diet today     - Pt presented with abdominal pain with leukocytosis, thrombocytosis, CT scan showing acute pancreatitis with interval decrease in size of the distal pancreatic body cystic lesion that was likely a pseudocyst.  Component of necrotizing pancreatitis cannot be excluded.  Perisplenic subcapsular hematoma still present-hold anticoagulation - Patient finished Levaquin on November 12 - Flagyl and cefepime discontinued - Last lipid panel shows triglycerides of 115 - No new medications that could have contributed to this acute flare - Previously thought to be alcoholic pancreatitis, but patient has not had a drink in 1 month per her report - Changed  dilaudid 11/27 with better  control.  Continue clear liquid diet.  -GI following -NG tube placed by general surgery 12/2 and tube feedings are tolerated thus far>> discontinued 07/11/2022   Discontinue NG tube 07/11/2022>>> advance diet slowly -    -CT abdomen and pelvis performed on 12/3 with no significant new changes -Continue clear liquid diet and advance further as tolerated -will consider discontinuing NG tube today 07/11/2022     Diabetes mellitus type II, controlled ([place]) - Resuming metformin - Sliding scale coverage for Now       Malnutrition of moderate degree - Hypoalbuminemia at 3.2 - Patient has been on the liquid only diet for a month per her report - only able to tolerate clear liquids>> advancing slowly - Nutrition was consulted, was following, tube feed has been discontinued   Acute hypoxic respiratory failure-currently resolved -Much improved  - Likely related to pleural effusion - Pneumonia cannot be excluded on chest x-ray but no clinical symptoms - Patient has recently been on Levaquin and Omnicef plus doxycycline - Discontinue further cefepime - Pain medications could also be contributing to this hypoxia   - Continue to monitor   Essential hypertension - Continue amlodipine -Discontinued Aldactone   Obesity -BMI 31.79     Consultants: GI   Disposition: Home Diet recommendation:  Discharge Diet Orders (From admission, onward)     Start     Ordered   07/13/22 0000  Diet full liquid        07/13/22 0735           Clear liquid diet, advance as tolerated DISCHARGE MEDICATION: Allergies as of 07/13/2022   No Known Allergies      Medication List     STOP taking these medications    levofloxacin 500 MG tablet Commonly known as: Levaquin   potassium chloride SA 20 MEQ tablet Commonly known as: Klor-Con M20   spironolactone 25 MG tablet Commonly known as: ALDACTONE       TAKE these medications    amLODipine 10 MG tablet Commonly known as: NORVASC Take 1 tablet (10 mg total) by mouth daily. What changed: when to take this   cyanocobalamin 1000 MCG/ML injection Commonly known as: VITAMIN B12 Inject 1 mL (1,000 mcg total) into the muscle every 30 (thirty) days.   folic acid 1 MG tablet Commonly known as: FOLVITE Take 1 tablet (1 mg total) by mouth daily. What changed: when to take this   ibuprofen 200 MG tablet Commonly known as: ADVIL Take 400 mg by  mouth daily as needed for headache or mild pain.   melatonin 3 MG Tabs tablet Take 2 tablets (6 mg total) by mouth at bedtime.   menthol-cetylpyridinium 3 MG lozenge Commonly known as: CEPACOL Take 1 lozenge (3 mg total) by mouth as needed for sore throat.   metFORMIN 500 MG tablet Commonly known as: GLUCOPHAGE Take 1 tablet (500 mg total) by mouth 2 (two) times daily with a meal.   ondansetron 4 MG tablet Commonly known as: Zofran Take 1 tablet (4 mg total) by mouth every 6 (six) hours as needed for nausea or vomiting.   oxyCODONE 5 MG immediate release tablet Commonly known as: Oxy IR/ROXICODONE Take 5-10 mg by mouth every 6 (six) hours as needed (pain).   pantoprazole 40 MG tablet Commonly known as: PROTONIX Take 1 tablet (40 mg total) by mouth daily.        Discharge Exam: Filed Weights   06/30/22 1722 07/02/22 0436 07/03/22 2150  Weight: 65.8 kg 75 kg  81.4 kg      Physical Exam:   General:  AAO x 3,  cooperative, no distress;   HEENT:  Normocephalic, PERRL, otherwise with in Normal limits   Neuro:  CNII-XII intact. , normal motor and sensation, reflexes intact   Lungs:   Clear to auscultation BL, Respirations unlabored,  No wheezes / crackles  Cardio:    S1/S2, RRR, No murmure, No Rubs or Gallops   Abdomen:  Soft, non-tender, bowel sounds active all four quadrants, no guarding or peritoneal signs.  Muscular  skeletal:  Limited exam -global generalized weaknesses - in bed, able to move all 4 extremities,   2+ pulses,  symmetric, No pitting edema  Skin:  Dry, warm to touch, negative for any Rashes,  Wounds: Please see nursing documentation          Condition at discharge: good  The results of significant diagnostics from this hospitalization (including imaging, microbiology, ancillary and laboratory) are listed below for reference.   Imaging Studies: CT ABDOMEN PELVIS W CONTRAST  Result Date: 07/08/2022 CLINICAL DATA:  Follow-up acute pancreatitis  and splenic hematoma. EXAM: CT ABDOMEN AND PELVIS WITH CONTRAST TECHNIQUE: Multidetector CT imaging of the abdomen and pelvis was performed using the standard protocol following bolus administration of intravenous contrast. RADIATION DOSE REDUCTION: This exam was performed according to the departmental dose-optimization program which includes automated exposure control, adjustment of the mA and/or kV according to patient size and/or use of iterative reconstruction technique. CONTRAST:  124m OMNIPAQUE IOHEXOL 300 MG/ML  SOLN COMPARISON:  06/30/2022 FINDINGS: Lower Chest: Moderate left pleural effusion and left lower lobe atelectasis show mild increase since previous study. Hepatobiliary: No hepatic masses identified. Nonocclusive thrombus is seen in the main and right portal veins. Gallbladder is unremarkable. No evidence of biliary ductal dilatation. Pancreas: Moderate acute pancreatitis shows no significant change. A 2.2 x 1.9 cm fluid collection in the splenic tail is also unchanged and consistent with a small pseudocyst. Spleen: Large subcapsular splenic hematoma measures 14.3 x 10.5 cm, without significant change in size compared to prior study. Splenic vein thrombosis also noted with venous collateral seen in the gastrosplenic ligament. Adrenals/Urinary Tract: No suspicious masses identified. No evidence of ureteral calculi or hydronephrosis. Stomach/Bowel: Nasogastric tube tip is seen in the distal gastric body. No evidence of obstruction, inflammatory process or abnormal fluid collections. Vascular/Lymphatic: No pathologically enlarged lymph nodes. Nonocclusive thrombus in the main and right portal veins and splenic vein thrombosis again noted, as described above. Reproductive:  No mass or other significant abnormality. Other:  None. Musculoskeletal:  No suspicious bone lesions identified. IMPRESSION: No significant change in moderate acute pancreatitis. Stable 2.2 cm pseudocyst in splenic tail. No  significant change in large subcapsular splenic hematoma. Nonocclusive thrombus in the main and right portal veins. Splenic vein thrombosis. Increased moderate left pleural effusion and left lower lobe atelectasis. Electronically Signed   By: JMarlaine HindM.D.   On: 07/08/2022 10:02   DG Chest Port 1 View  Result Date: 07/07/2022 CLINICAL DATA:  Assess for nasogastric tube placement EXAM: PORTABLE CHEST 1 VIEW COMPARISON:  June 30, 2022 FINDINGS: The heart size and mediastinal contours are stable. Patchy consolidation of left lung base with left pleural effusion noted. Mild patchy consolidation of medial right lung base is noted. Nasogastric tube is identified with distal tip in the distal stomach. The visualized skeletal structures are stable. IMPRESSION: Nasogastric tube with distal tip in the distal stomach. Patchy consolidation of left lung base with left pleural effusion  noted. Mild patchy consolidation of medial right lung base is noted. Electronically Signed   By: Abelardo Diesel M.D.   On: 07/07/2022 09:19   CT ABDOMEN PELVIS W CONTRAST  Result Date: 06/30/2022 CLINICAL DATA:  Abdominal pain, acute, nonlocalized diffuse pain, recent pancreatitis EXAM: CT ABDOMEN AND PELVIS WITH CONTRAST TECHNIQUE: Multidetector CT imaging of the abdomen and pelvis was performed using the standard protocol following bolus administration of intravenous contrast. RADIATION DOSE REDUCTION: This exam was performed according to the departmental dose-optimization program which includes automated exposure control, adjustment of the mA and/or kV according to patient size and/or use of iterative reconstruction technique. CONTRAST:  125m OMNIPAQUE IOHEXOL 300 MG/ML  SOLN COMPARISON:  None Available. FINDINGS: Lower chest: Trace bilateral, left greater than right, pleural effusion. Associated passive atelectasis of left lower lobe. Hepatobiliary: The liver is enlarged measuring up to 19 cm. No focal liver abnormality. No  gallstones or definite gallbladder wall thickening. Positive for pericholecystic fluid. No biliary dilatation. Pancreas: No focal lesion. Hazy pancreatic contour with associated peripancreatic fat stranding and free fluid. Slightly decreased in size cystic lesion measuring 2 x 2 cm along the distal pancreatic body. Poorly visualized distal pancreatic tail likely due to cystic changes and edema with underlying nonenhancement of the pancreatic tail not fully excluded. Interval resolution of cystic lesion along the greater curvature of the stomach. No main pancreatic ductal dilatation. Spleen: Similar-appearing heterogeneous 14 x 8 cm perisplenic subcapsular lesion likely representing a hematoma. Adrenals/Urinary Tract: No focal lesion. Normal pancreatic contour. No surrounding inflammatory changes. No main pancreatic ductal dilatation. Stomach/Bowel: Stomach is within normal limits. No evidence of bowel wall thickening or dilatation. Appendix appears normal. Vascular/Lymphatic: No splenic artery aneurysm identified. Venous collaterals noted. No abdominal aorta or iliac aneurysm. Mild atherosclerotic plaque of the aorta and its branches. No abdominal, pelvic, or inguinal lymphadenopathy. Reproductive: Uterus and bilateral adnexa are unremarkable. Other: Small volume simple free fluid ascites. No intraperitoneal free gas. No organized fluid collection. Musculoskeletal: No abdominal wall hernia or abnormality. Soft tissue density along the anterior abdominal wall likely due to medication injection. No suspicious lytic or blastic osseous lesions. No acute displaced fracture. Multilevel degenerative changes of the spine. IMPRESSION: 1. Acute pancreatitis with interval decrease in size of a distal pancreatic body cystic lesion likely representing a pseudocyst. Interval resolution of cystic lesion along the greater curvature of the stomach. Component of necrotizing pancreatitis along the pancreatic tail is not fully  excluded. 2. Similar-appearing heterogeneous 14 x 8 cm perisplenic subcapsular hematoma. 3. Nonspecific pericholecystic fluid with no definite CT findings acute cholecystitis. 4. Small volume simple free fluid ascites. 5. Trace bilateral, left greater than right, pleural effusion. 6. Mild hepatomegaly. 7. Venous collaterals. Electronically Signed   By: MIven FinnM.D.   On: 06/30/2022 22:45   DG Chest Port 1 View  Result Date: 06/30/2022 CLINICAL DATA:  Recent effusion with decreased lung sounds on the left EXAM: PORTABLE CHEST 1 VIEW COMPARISON:  Radiographs 06/13/2022 FINDINGS: Slight decrease in small left pleural effusion and associated atelectasis since 06/13/2022. Possible trace right pleural effusion. No pneumothorax. Stable cardiomediastinal silhouette. No acute osseous abnormality. IMPRESSION: Since 06/13/2022, decreased small left pleural effusion and associated atelectasis. Pneumonia is difficult to exclude. Electronically Signed   By: TPlacido SouM.D.   On: 06/30/2022 18:26   DG CHEST PORT 1 VIEW  Result Date: 06/13/2022 CLINICAL DATA:  Left pleural effusion vision. EXAM: PORTABLE CHEST 1 VIEW COMPARISON:  Radiograph earlier today. FINDINGS: The previous pigtail catheter  in the left hemithorax is no longer seen. Similar size left pleural effusion and basilar opacity from earlier today. No visualized pneumothorax. Overall low lung volumes without focal right lung airspace disease. Stable heart size and mediastinal contours. IMPRESSION: Removal of pigtail catheter from the left hemithorax. Similar size left pleural effusion and basilar opacity from earlier today. No visualized pneumothorax. Electronically Signed   By: Keith Rake M.D.   On: 06/13/2022 15:54    Microbiology: Results for orders placed or performed during the hospital encounter of 06/30/22  Resp Panel by RT-PCR (Flu A&B, Covid) Anterior Nasal Swab     Status: None   Collection Time: 06/30/22  6:02 PM   Specimen:  Anterior Nasal Swab  Result Value Ref Range Status   SARS Coronavirus 2 by RT PCR NEGATIVE NEGATIVE Final    Comment: (NOTE) SARS-CoV-2 target nucleic acids are NOT DETECTED.  The SARS-CoV-2 RNA is generally detectable in upper respiratory specimens during the acute phase of infection. The lowest concentration of SARS-CoV-2 viral copies this assay can detect is 138 copies/mL. A negative result does not preclude SARS-Cov-2 infection and should not be used as the sole basis for treatment or other patient management decisions. A negative result may occur with  improper specimen collection/handling, submission of specimen other than nasopharyngeal swab, presence of viral mutation(s) within the areas targeted by this assay, and inadequate number of viral copies(<138 copies/mL). A negative result must be combined with clinical observations, patient history, and epidemiological information. The expected result is Negative.  Fact Sheet for Patients:  EntrepreneurPulse.com.au  Fact Sheet for Healthcare Providers:  IncredibleEmployment.be  This test is no t yet approved or cleared by the [place] FDA and  has been authorized for detection and/or diagnosis of SARS-CoV-2 by FDA under an Emergency Use Authorization (EUA). This EUA will remain  in effect (meaning this test can be used) for the duration of the COVID-19 declaration under Section 564(b)(1) of the Act, 21 U.S.C.section 360bbb-3(b)(1), unless the authorization is terminated  or revoked sooner.       Influenza A by PCR NEGATIVE NEGATIVE Final   Influenza B by PCR NEGATIVE NEGATIVE Final    Comment: (NOTE) The Xpert Xpress SARS-CoV-2/FLU/RSV plus assay is intended as an aid in the diagnosis of influenza from Nasopharyngeal swab specimens and should not be used as a sole basis for treatment. Nasal washings and aspirates are unacceptable for Xpert Xpress SARS-CoV-2/FLU/RSV testing.  Fact  Sheet for Patients: EntrepreneurPulse.com.au  Fact Sheet for Healthcare Providers: IncredibleEmployment.be  This test is not yet approved or cleared by the [place] FDA and has been authorized for detection and/or diagnosis of SARS-CoV-2 by FDA under an Emergency Use Authorization (EUA). This EUA will remain in effect (meaning this test can be used) for the duration of the COVID-19 declaration under Section 564(b)(1) of the Act, 21 U.S.C. section 360bbb-3(b)(1), unless the authorization is terminated or revoked.  Performed at [facility], [address] [place], [place] [address]   Blood culture (routine x 2)     Status: None   Collection Time: 06/30/22  6:13 PM   Specimen: BLOOD LEFT FOREARM  Result Value Ref Range Status   Specimen Description   Final    BLOOD LEFT FOREARM BOTTLES DRAWN AEROBIC AND ANAEROBIC   Special Requests Blood Culture adequate volume  Final   Culture   Final    NO GROWTH 5 DAYS Performed at [facility], [address], [place], [place] [address]    Report  Status 07/05/2022 FINAL  Final  Blood culture (routine x 2)     Status: None   Collection Time: 06/30/22  6:29 PM   Specimen: Right Antecubital; Blood  Result Value Ref Range Status   Specimen Description   Final    RIGHT ANTECUBITAL BOTTLES DRAWN AEROBIC AND ANAEROBIC   Special Requests Blood Culture adequate volume  Final   Culture   Final    NO GROWTH 5 DAYS Performed at [facility], [address], [place], [place] [address]    Report Status 07/05/2022 FINAL  Final  Urine Culture     Status: None   Collection Time: 06/30/22  9:21 PM   Specimen: Urine, Clean Catch  Result Value Ref Range Status   Specimen Description   Final    URINE, CLEAN CATCH Performed at [facility], [address] [place], [place] [address]    Special Requests   Final    NONE Performed at [facility], [address] [place], [place] [address]    Culture   Final    NO  GROWTH Performed at [facility], [place] [address], [place], [place] [address]    Report Status 07/02/2022 FINAL  Final  MRSA Next Gen by PCR, Nasal     Status: None   Collection Time: 07/01/22  3:09 AM   Specimen: Nasal Mucosa; Nasal Swab  Result Value Ref Range Status   MRSA by PCR Next Gen NOT DETECTED NOT DETECTED Final    Comment: (NOTE) The GeneXpert MRSA Assay (FDA approved for NASAL specimens only), is one component of a comprehensive MRSA colonization surveillance program. It is not intended to diagnose MRSA infection nor to guide or monitor treatment for MRSA infections. Test performance is not FDA approved in patients less than 92 years old. Performed at [facility], [address] [place], [place] [address]     Labs: CBC: Recent Labs  Lab 07/07/22 0344 07/08/22 0447 07/11/22 0452  WBC 6.7 6.5 9.0  HGB 11.0* 11.0* 10.5*  HCT 35.4* 35.6* 33.6*  MCV 95.7 96.0 95.5  PLT 400 258 177   Basic Metabolic Panel: Recent Labs  Lab 07/07/22 0344 07/08/22 0447 07/09/22 0504 07/10/22 0453 07/11/22 0452  NA 136 136 136 137 137  K 4.0 4.8 3.7 3.7 4.0  CL 98 102 100 102 103  CO2 '31 27 30 29 29  '$ GLUCOSE 80 92 92 112* 88  BUN <5* <5* <5* <5* 6  CREATININE 0.36* 0.43* 0.38* 0.34* 0.39*  CALCIUM 8.6* 8.5* 8.3* 8.1* 7.9*  MG 1.7 1.7 1.4* 1.8 1.5*   Liver Function Tests: Recent Labs  Lab 07/07/22 0344 07/08/22 0447 07/09/22 0504  AST 14* 23 14*  ALT '7 11 6  '$ ALKPHOS 62 59 63  BILITOT 0.4 0.8 0.5  PROT 6.7 6.8 6.4*  ALBUMIN 2.3* 2.4* 2.2*   CBG: Recent Labs  Lab 07/12/22 0038 07/12/22 0734 07/12/22 1126 07/12/22 1636 07/13/22 0000  GLUCAP 82 84 78 108* 77    Discharge time spent: greater than 30 minutes.  Signed: Deatra James, MD [facility] 07/13/2022

## 2022-07-13 NOTE — Plan of Care (Signed)

## 2022-07-16 ENCOUNTER — Telehealth: Payer: Self-pay

## 2022-07-16 NOTE — Telephone Encounter (Signed)
Transition Care Management Follow-up Telephone Call Date of discharge and from where: [facility] 07/13/2022 How have you been since you were released from the hospital? Still having some pain, some better Any questions or concerns? No  Items Reviewed: Did the pt receive and understand the discharge instructions provided? Yes  Medications obtained and verified? Yes  Other? No  Any new allergies since your discharge? No  Dietary orders reviewed? Yes Do you have support at home? Yes   Home Care and Equipment/Supplies: Were home health services ordered? no If so, what is the name of the agency? N/a  Has the agency set up a time to come to the patient's home? not applicable Were any new equipment or medical supplies ordered?  No What is the name of the medical supply agency? N/a Were you able to get the supplies/equipment? not applicable Do you have any questions related to the use of the equipment or supplies? No  Functional Questionnaire: (I = Independent and D = Dependent) ADLs: I  Bathing/Dressing- I  Meal Prep- I  Eating- I  Maintaining continence- I  Transferring/Ambulation- I  Managing Meds- I  Follow up appointments reviewed:  PCP Hospital f/u appt confirmed? Yes  Scheduled to see Dr Dennard Schaumann on 07/23/2022 @ 12:00. [facility] f/u appt confirmed? No   Are transportation arrangements needed? No  If their condition worsens, is the pt aware to call PCP or go to the Emergency Dept.? Yes Was the patient provided with contact information for the PCP's office or ED? Yes Was to pt encouraged to call back with questions or concerns? Yes Juanda Crumble, LPN [place] Direct Dial [phone]

## 2022-07-18 ENCOUNTER — Ambulatory Visit (INDEPENDENT_AMBULATORY_CARE_PROVIDER_SITE_OTHER): Payer: 59 | Admitting: Family Medicine

## 2022-07-18 ENCOUNTER — Encounter: Payer: Self-pay | Admitting: Family Medicine

## 2022-07-18 ENCOUNTER — Telehealth: Payer: Self-pay

## 2022-07-18 VITALS — BP 120/100 | HR 110 | Temp 97.7°F | Ht 63.0 in | Wt 151.0 lb

## 2022-07-18 DIAGNOSIS — K861 Other chronic pancreatitis: Secondary | ICD-10-CM

## 2022-07-18 DIAGNOSIS — E162 Hypoglycemia, unspecified: Secondary | ICD-10-CM

## 2022-07-18 LAB — GLUCOSE 16585: Glucose: 97 mg/dL (ref 65–99)

## 2022-07-18 MED ORDER — PANCRELIPASE (LIP-PROT-AMYL) 36000-114000 UNITS PO CPEP: ORAL_CAPSULE | ORAL | 11 refills | Status: DC

## 2022-07-18 NOTE — Assessment & Plan Note (Signed)
Patients symptoms are stable and pain is unchanged since hospitalization, she is unable to tolerate much PO intake. Discussed her case with GI provider at [facility] and will start Creon 1,000 units/kg TID with meals and 500units/kg with meals. Instructed to continue clear liquids diet until she can start the Creon and advance diet as tolerated. Instructed to stop taking Ibuprofen and start Tylenol extra strength. Proceed to ED for worsening abdominal pain, fever, or inability to tolerate food.

## 2022-07-18 NOTE — Progress Notes (Signed)
Acute Office Visit  Subjective:     Patient ID: Jillian Shepherd, female    DOB: [birth year], 36 y.o.   MRN: [id number]  Chief Complaint  Patient presents with   Follow-up    Sharp pains in abdomen and lower stomach Would like referral for GI  Haven't ate since yesterday morning/need to check BS; slightly shaking  Feels nausea    HPI Patient is in today for dull abdominal pain on right upper abdomen, pain is 7/10, constant, associated with nausea. She has a history of chronic pancreatitis requiring hospitalizations every 2 weeks. Recommendations were GI referral, zofran PRN, and Protonix.  She quit drinking alcohol and smoking, is eating jello, pudding, and broths and not tolerating much else. Has been taking Ibuprofen '800mg'$  for pain.  Review of Systems  All other systems reviewed and are negative.       Objective:    BP (!) 120/100   Pulse (!) 110   Temp 97.7 F (36.5 C) (Oral)   Ht '5\' 3"'$  (1.6 m)   Wt 151 lb (68.5 kg)   LMP 07/01/2022 (Approximate)   SpO2 96%   BMI 26.75 kg/m    Physical Exam Vitals and nursing note reviewed.  Constitutional:      Appearance: Normal appearance. She is normal weight.  HENT:     Head: Normocephalic and atraumatic.  Cardiovascular:     Rate and Rhythm: Tachycardia present.     Pulses: Normal pulses.     Heart sounds: Normal heart sounds.  Abdominal:     General: Abdomen is flat. There is no distension.     Palpations: Abdomen is soft.     Tenderness: There is abdominal tenderness. There is guarding and rebound.  Skin:    General: Skin is warm and dry.  Neurological:     General: No focal deficit present.     Mental Status: She is alert and oriented to person, place, and time. Mental status is at baseline.  Psychiatric:        Mood and Affect: Mood normal.        Behavior: Behavior normal.        Thought Content: Thought content normal.        Judgment: Judgment normal.    Results for orders placed or performed in visit  on 07/18/22  GLUCOSE 02725  Result Value Ref Range   Glucose 97 65 - 99 mg/dL        Assessment & Plan:   Problem List Items Addressed This Visit       Digestive   Chronic pancreatitis ([place]) - Primary    Patients symptoms are stable and pain is unchanged since hospitalization, she is unable to tolerate much PO intake. Discussed her case with GI provider at [facility] and will start Creon 1,000 units/kg TID with meals and 500units/kg with meals. Instructed to continue clear liquids diet until she can start the Creon and advance diet as tolerated. Instructed to stop taking Ibuprofen and start Tylenol extra strength. Proceed to ED for worsening abdominal pain, fever, or inability to tolerate food.      Relevant Medications   lipase/protease/amylase (CREON) 36000 UNITS CPEP capsule   Other Relevant Orders   Ambulatory referral to Gastroenterology   Other Visit Diagnoses     Low blood sugar       Relevant Orders   Glucose, fingerstick (stat)       Meds ordered this encounter  Medications   lipase/protease/amylase (CREON) 36000 UNITS CPEP  capsule    Sig: Take 2 capsules (72,000 Units total) by mouth 3 (three) times daily with meals. May also take 1 capsule (36,000 Units total) as needed (with snacks).    Dispense:  240 capsule    Refill:  11    Order Specific Question:   Supervising Provider    Answer:   Jenna Luo T [[id number]]    Return if symptoms worsen or fail to improve.  Rubie Maid, FNP

## 2022-07-18 NOTE — Telephone Encounter (Signed)
Spoke w/pt per Amber,FNP:Can you please call and let her know she should NOT be taking Ibuprofen, that might be making her abdomen pain worse off, but she can try Tylenol '1000mg'$  every 8 hours.   Told pt to take Tylenol '500mg'$ -take 2(total '1000mg'$ ) per every 8 hours. Per pt voiced understanding and nothing further. Told pt to called back if abdomen pain not feeling better

## 2022-07-20 ENCOUNTER — Other Ambulatory Visit: Payer: Self-pay

## 2022-07-20 ENCOUNTER — Telehealth: Payer: Self-pay

## 2022-07-20 ENCOUNTER — Other Ambulatory Visit: Payer: Self-pay | Admitting: Family Medicine

## 2022-07-20 DIAGNOSIS — K861 Other chronic pancreatitis: Secondary | ICD-10-CM

## 2022-07-20 MED ORDER — PANCRELIPASE (LIP-PROT-AMYL) 36000-114000 UNITS PO CPEP: ORAL_CAPSULE | ORAL | 11 refills | Status: DC

## 2022-07-20 MED ORDER — OXYCODONE-ACETAMINOPHEN 5-325 MG PO TABS: 1.0000 | ORAL_TABLET | Freq: Four times a day (QID) | ORAL | 0 refills | Status: DC | PRN

## 2022-07-20 NOTE — Telephone Encounter (Signed)
Pt called stating that the tylenol is not helping with her stomach pain?   Pls advice

## 2022-07-20 NOTE — Telephone Encounter (Addendum)
Called pt, aware oxycodone sent to pharmacy

## 2022-07-21 ENCOUNTER — Emergency Department (HOSPITAL_COMMUNITY): Payer: 59

## 2022-07-21 ENCOUNTER — Other Ambulatory Visit: Payer: Self-pay

## 2022-07-21 ENCOUNTER — Inpatient Hospital Stay (HOSPITAL_COMMUNITY)
Admission: EM | Admit: 2022-07-21 | Discharge: 2022-07-27 | DRG: 438 | Disposition: A | Discharge status: Home Health | Payer: 59 | Attending: Internal Medicine | Admitting: Internal Medicine

## 2022-07-21 ENCOUNTER — Encounter (HOSPITAL_COMMUNITY): Payer: Self-pay | Admitting: Emergency Medicine

## 2022-07-21 DIAGNOSIS — F17210 Nicotine dependence, cigarettes, uncomplicated: Secondary | ICD-10-CM | POA: Diagnosis present

## 2022-07-21 DIAGNOSIS — Z833 Family history of diabetes mellitus: Secondary | ICD-10-CM

## 2022-07-21 DIAGNOSIS — K861 Other chronic pancreatitis: Secondary | ICD-10-CM | POA: Diagnosis present

## 2022-07-21 DIAGNOSIS — E440 Moderate protein-calorie malnutrition: Secondary | ICD-10-CM | POA: Diagnosis present

## 2022-07-21 DIAGNOSIS — E11649 Type 2 diabetes mellitus with hypoglycemia without coma: Secondary | ICD-10-CM | POA: Diagnosis present

## 2022-07-21 DIAGNOSIS — I82890 Acute embolism and thrombosis of other specified veins: Secondary | ICD-10-CM | POA: Diagnosis present

## 2022-07-21 DIAGNOSIS — Z7984 Long term (current) use of oral hypoglycemic drugs: Secondary | ICD-10-CM | POA: Diagnosis not present

## 2022-07-21 DIAGNOSIS — Z85828 Personal history of other malignant neoplasm of skin: Secondary | ICD-10-CM | POA: Diagnosis not present

## 2022-07-21 DIAGNOSIS — Z818 Family history of other mental and behavioral disorders: Secondary | ICD-10-CM

## 2022-07-21 DIAGNOSIS — E119 Type 2 diabetes mellitus without complications: Secondary | ICD-10-CM

## 2022-07-21 DIAGNOSIS — Z808 Family history of malignant neoplasm of other organs or systems: Secondary | ICD-10-CM

## 2022-07-21 DIAGNOSIS — E876 Hypokalemia: Secondary | ICD-10-CM | POA: Diagnosis present

## 2022-07-21 DIAGNOSIS — Z6826 Body mass index (BMI) 26.0-26.9, adult: Secondary | ICD-10-CM | POA: Diagnosis not present

## 2022-07-21 DIAGNOSIS — Z79899 Other long term (current) drug therapy: Secondary | ICD-10-CM

## 2022-07-21 DIAGNOSIS — D735 Infarction of spleen: Secondary | ICD-10-CM | POA: Diagnosis present

## 2022-07-21 DIAGNOSIS — I10 Essential (primary) hypertension: Secondary | ICD-10-CM | POA: Diagnosis present

## 2022-07-21 DIAGNOSIS — I151 Hypertension secondary to other renal disorders: Secondary | ICD-10-CM

## 2022-07-21 DIAGNOSIS — I81 Portal vein thrombosis: Secondary | ICD-10-CM

## 2022-07-21 DIAGNOSIS — K852 Alcohol induced acute pancreatitis without necrosis or infection: Secondary | ICD-10-CM | POA: Diagnosis present

## 2022-07-21 DIAGNOSIS — Z8249 Family history of ischemic heart disease and other diseases of the circulatory system: Secondary | ICD-10-CM | POA: Diagnosis not present

## 2022-07-21 DIAGNOSIS — K859 Acute pancreatitis without necrosis or infection, unspecified: Secondary | ICD-10-CM | POA: Diagnosis present

## 2022-07-21 DIAGNOSIS — D72829 Elevated white blood cell count, unspecified: Secondary | ICD-10-CM | POA: Insufficient documentation

## 2022-07-21 DIAGNOSIS — K863 Pseudocyst of pancreas: Secondary | ICD-10-CM | POA: Diagnosis present

## 2022-07-21 LAB — COMPREHENSIVE METABOLIC PANEL
ALT: 16 U/L (ref 0–44)
AST: 44 U/L — ABNORMAL HIGH (ref 15–41)
Albumin: 3.1 g/dL — ABNORMAL LOW (ref 3.5–5.0)
Alkaline Phosphatase: 98 U/L (ref 38–126)
Anion gap: 12 (ref 5–15)
BUN: 11 mg/dL (ref 6–20)
CO2: 30 mmol/L (ref 22–32)
Calcium: 9 mg/dL (ref 8.9–10.3)
Chloride: 94 mmol/L — ABNORMAL LOW (ref 98–111)
Creatinine, Ser: 0.52 mg/dL (ref 0.44–1.00)
GFR, Estimated: >60 mL/min (ref >60)
Glucose, Bld: 93 mg/dL (ref 70–99)
Potassium: 3.2 mmol/L — ABNORMAL LOW (ref 3.5–5.1)
Sodium: 136 mmol/L (ref 135–145)
Total Bilirubin: 1.3 mg/dL — ABNORMAL HIGH (ref 0.3–1.2)
Total Protein: 9.3 g/dL — ABNORMAL HIGH (ref 6.5–8.1)

## 2022-07-21 LAB — CBC WITH DIFFERENTIAL/PLATELET
Abs Immature Granulocytes: 0.06 10*3/uL (ref 0.00–0.07)
Basophils Absolute: 0.1 10*3/uL (ref 0.0–0.1)
Basophils Relative: 0 %
Eosinophils Absolute: 0.2 10*3/uL (ref 0.0–0.5)
Eosinophils Relative: 1 %
HCT: 42.6 % (ref 36.0–46.0)
Hemoglobin: 14.1 g/dL (ref 12.0–15.0)
Immature Granulocytes: 0 %
Lymphocytes Relative: 12 %
Lymphs Abs: 2.4 10*3/uL (ref 0.7–4.0)
MCH: 28.7 pg (ref 26.0–34.0)
MCHC: 33.1 g/dL (ref 30.0–36.0)
MCV: 86.8 fL (ref 80.0–100.0)
Monocytes Absolute: 0.8 10*3/uL (ref 0.1–1.0)
Monocytes Relative: 4 %
Neutro Abs: 15.6 10*3/uL — ABNORMAL HIGH (ref 1.7–7.7)
Neutrophils Relative %: 83 %
Platelets: 519 10*3/uL — ABNORMAL HIGH (ref 150–400)
RBC: 4.91 MIL/uL (ref 3.87–5.11)
RDW: 17.6 % — ABNORMAL HIGH (ref 11.5–15.5)
WBC: 19 10*3/uL — ABNORMAL HIGH (ref 4.0–10.5)
nRBC: 0 % (ref 0.0–0.2)

## 2022-07-21 LAB — URINALYSIS, ROUTINE W REFLEX MICROSCOPIC
Glucose, UA: NEGATIVE mg/dL
Hgb urine dipstick: NEGATIVE
Ketones, ur: 20 mg/dL — AB
Leukocytes,Ua: NEGATIVE
Nitrite: NEGATIVE
Protein, ur: 100 mg/dL — AB
Specific Gravity, Urine: 1.029 (ref 1.005–1.030)
pH: 5 (ref 5.0–8.0)

## 2022-07-21 LAB — LACTIC ACID, PLASMA
Lactic Acid, Venous: 0.8 mmol/L (ref 0.5–1.9)
Lactic Acid, Venous: 0.6 mmol/L (ref 0.5–1.9)

## 2022-07-21 LAB — POC URINE PREG, ED: Preg Test, Ur: NEGATIVE

## 2022-07-21 LAB — PROCALCITONIN: Procalcitonin: <0.1 ng/mL

## 2022-07-21 LAB — LIPASE, BLOOD: Lipase: 650 U/L — ABNORMAL HIGH (ref 11–51)

## 2022-07-21 MED ORDER — MORPHINE SULFATE (PF) 4 MG/ML IV SOLN
4.0000 mg | Freq: Once | INTRAVENOUS | Status: AC
  Administered 2022-07-21: 4 mg via INTRAVENOUS
  Filled 2022-07-21: qty 1

## 2022-07-21 MED ORDER — HYDROMORPHONE HCL 1 MG/ML IJ SOLN
1.0000 mg | Freq: Once | INTRAMUSCULAR | Status: AC
  Administered 2022-07-21: 1 mg via INTRAVENOUS
  Filled 2022-07-21: qty 1

## 2022-07-21 MED ORDER — ENOXAPARIN SODIUM 40 MG/0.4ML IJ SOSY
40.0000 mg | PREFILLED_SYRINGE | INTRAMUSCULAR | Status: DC
  Administered 2022-07-22: 40 mg via SUBCUTANEOUS
  Filled 2022-07-21: qty 0.4

## 2022-07-21 MED ORDER — ONDANSETRON HCL 4 MG/2ML IJ SOLN
4.0000 mg | Freq: Four times a day (QID) | INTRAMUSCULAR | Status: DC | PRN
  Administered 2022-07-22: 4 mg via INTRAVENOUS
  Filled 2022-07-21: qty 2

## 2022-07-21 MED ORDER — PANTOPRAZOLE SODIUM 40 MG IV SOLR
40.0000 mg | Freq: Two times a day (BID) | INTRAVENOUS | Status: DC
  Administered 2022-07-22 – 2022-07-27 (×11): 40 mg via INTRAVENOUS
  Filled 2022-07-21 (×11): qty 10

## 2022-07-21 MED ORDER — IOHEXOL 300 MG/ML  SOLN
100.0000 mL | Freq: Once | INTRAMUSCULAR | Status: AC | PRN
  Administered 2022-07-21: 100 mL via INTRAVENOUS

## 2022-07-21 MED ORDER — METOCLOPRAMIDE HCL 5 MG/ML IJ SOLN
10.0000 mg | Freq: Once | INTRAMUSCULAR | Status: AC
  Administered 2022-07-21: 10 mg via INTRAVENOUS
  Filled 2022-07-21: qty 2

## 2022-07-21 MED ORDER — POTASSIUM CHLORIDE IN NACL 40-0.9 MEQ/L-% IV SOLN
INTRAVENOUS | Status: DC
  Filled 2022-07-21 (×3): qty 1000

## 2022-07-21 MED ORDER — SODIUM CHLORIDE 0.9 % IV BOLUS
1000.0000 mL | Freq: Once | INTRAVENOUS | Status: AC
  Administered 2022-07-21: 1000 mL via INTRAVENOUS

## 2022-07-21 MED ORDER — HYDROMORPHONE HCL 1 MG/ML IJ SOLN
1.0000 mg | INTRAMUSCULAR | Status: DC | PRN
  Administered 2022-07-21 – 2022-07-22 (×6): 1 mg via INTRAVENOUS
  Filled 2022-07-21 (×6): qty 1

## 2022-07-21 MED ORDER — AMLODIPINE BESYLATE 5 MG PO TABS
10.0000 mg | ORAL_TABLET | Freq: Every morning | ORAL | Status: DC
  Administered 2022-07-22 – 2022-07-27 (×6): 10 mg via ORAL
  Filled 2022-07-21 (×7): qty 2

## 2022-07-21 MED ORDER — SODIUM CHLORIDE 0.9 % IV BOLUS
500.0000 mL | Freq: Once | INTRAVENOUS | Status: AC
  Administered 2022-07-21: 500 mL via INTRAVENOUS

## 2022-07-21 MED ORDER — ONDANSETRON HCL 4 MG PO TABS: 4.0000 mg | ORAL_TABLET | Freq: Four times a day (QID) | ORAL | Status: DC | PRN

## 2022-07-21 NOTE — H&P (Signed)
History and Physical    Patient: Jillian Shepherd UKG:[id number] DOB: [birth year] DOA: 07/21/2022 DOS: the patient was seen and examined on 07/21/2022 PCP: [name], MD  Patient coming from: Home  Chief Complaint:  Chief Complaint  Patient presents with   Abdominal Pain   HPI: Jillian Shepherd is a 36 y.o. female with medical history significant of Liddle Syndrome, Obesity, alcoholic pancreatitis, chronic pancreatitis with pseudocyst. Patient has been hospitalized intermittently since the beginning of October for acute alcoholic pancreatitis. Since her hospitalization, she has stopped drinking alcohol, but has recurrent episodes of pain whenever she tries to start eating.  She was last discharged 8 days ago and went home.  Unfortunately, whenever she tried to eat, her pain would start to increase.  She also had a lot of nausea and vomiting whenever she tried to eat food.  Because of the increased pain, she has not been eating much and presents to the hospital for evaluation.  She denies fevers, but states that she has chills.  Her pain is epigastric with some pain in her right upper quadrant.  Denies dysuria, suprapubic pain.  Review of Systems: As mentioned in the history of present illness. All other systems reviewed and are negative. Past Medical History:  Diagnosis Date   Abdominal pain    Alcohol abuse    B12 deficiency    Cancer (HCC)    squmaous cell skin cancer    Collagen vascular disease (HCC)    Hypokalemia    Liddle's syndrome    Obesity (BMI 35.0-39.9 without comorbidity)    Pancreatitis    Pleural effusion    Splenic hemorrhage    Past Surgical History:  Procedure Laterality Date   ADENOIDECTOMY     CHEST TUBE INSERTION     COLONOSCOPY WITH PROPOFOL N/A 10/06/2020   Procedure: COLONOSCOPY WITH PROPOFOL;  Surgeon: Lin Landsman, MD;  Location: [facility];  Service: Gastroenterology;  Laterality: N/A;  COVID POSITIVE 08/23/2020    ESOPHAGOGASTRODUODENOSCOPY (EGD) WITH PROPOFOL N/A 10/06/2020   Procedure: ESOPHAGOGASTRODUODENOSCOPY (EGD) WITH PROPOFOL;  Surgeon: Lin Landsman, MD;  Location: [facility];  Service: Gastroenterology;  Laterality: N/A;   skin cancer removal Left    leg   TONSILLECTOMY     Social History:  reports that she has been smoking cigarettes. She has a 2.25 pack-year smoking history. She has never used smokeless tobacco. She reports current alcohol use of about 2.0 standard drinks of alcohol per week. She reports that she does not use drugs.  No Known Allergies  Family History  Problem Relation Age of Onset   Hypertension Father    Anxiety disorder Father    Depression Father    Melanoma Paternal Grandmother        of skin   Diabetes Paternal Grandmother    Cirrhosis Paternal Uncle     Prior to Admission medications   Medication Sig Start Date End Date Taking? Authorizing Provider  amLODipine (NORVASC) 10 MG tablet Take 1 tablet (10 mg total) by mouth daily. Patient taking differently: Take 10 mg by mouth in the morning. 04/10/22   Rubie Maid, FNP  cyanocobalamin (VITAMIN B12) 1000 MCG/ML injection Inject 1 mL (1,000 mcg total) into the muscle every 30 (thirty) days. 05/17/22   [name], MD  folic acid (FOLVITE) 1 MG tablet Take 1 tablet (1 mg total) by mouth daily. Patient taking differently: Take 1 mg by mouth in the morning. 05/17/22 05/12/23  [name], MD  ibuprofen (ADVIL)  200 MG tablet Take 400 mg by mouth daily as needed for headache or mild pain.    [provider]  lipase/protease/amylase (CREON) 36000 UNITS CPEP capsule Take 2 capsules (72,000 Units total) by mouth 3 (three) times daily with meals. May also take 1 capsule (36,000 Units total) as needed (with snacks). 07/20/22   Rubie Maid, FNP  melatonin 3 MG TABS tablet Take 2 tablets (6 mg total) by mouth at bedtime. 07/13/22 08/12/22  Shahmehdi, Valeria Batman, MD  menthol-cetylpyridinium  (CEPACOL) 3 MG lozenge Take 1 lozenge (3 mg total) by mouth as needed for sore throat. 07/13/22   Shahmehdi, Valeria Batman, MD  metFORMIN (GLUCOPHAGE) 500 MG tablet Take 1 tablet (500 mg total) by mouth 2 (two) times daily with a meal. 05/18/22   Pickard, Cammie Mcgee, MD  ondansetron (ZOFRAN) 4 MG tablet Take 1 tablet (4 mg total) by mouth every 6 (six) hours as needed for nausea or vomiting. 07/13/22 08/12/22  ShahmehdiValeria Batman, MD  oxyCODONE-acetaminophen (PERCOCET/ROXICET) 5-325 MG tablet Take 1 tablet by mouth every 6 (six) hours as needed for up to 7 days for severe pain. 07/20/22 07/27/22  [name], MD  pantoprazole (PROTONIX) 40 MG tablet Take 1 tablet (40 mg total) by mouth daily. 07/13/22 08/12/22  Deatra James, MD    Physical Exam: Vitals:   07/21/22 1006 07/21/22 1300 07/21/22 1400 07/21/22 1430  BP: (!) 150/101 (!) 135/90 126/88 123/83  Pulse: (!) 115 93 95 94  Resp: 20 (!) '24 18 20  '$ Temp: 98 F (36.7 C)   98.3 F (36.8 C)  TempSrc:    Oral  SpO2: 96% 92% 90% 94%  Weight:      Height:       General: Young female. Awake and alert and oriented x3. No acute cardiopulmonary distress.  HEENT: Normocephalic atraumatic.  Right and left ears normal in appearance.  Pupils equal, round, reactive to light. Extraocular muscles are intact. Sclerae anicteric and noninjected.  Moist mucosal membranes. No mucosal lesions.  Neck: Neck supple without lymphadenopathy. No carotid bruits. No masses palpated.  Cardiovascular: Regular rate with normal S1-S2 sounds. No murmurs, rubs, gallops auscultated. No JVD.  Respiratory: Good respiratory effort with no wheezes, rales, rhonchi. Lungs clear to auscultation bilaterally.  No accessory muscle use. Abdomen: Tender in the epigastric and right upper quadrant without rebound or guarding. Nondistended. Active bowel sounds. No masses or hepatosplenomegaly  Skin: No rashes, lesions, or ulcerations.  Dry, warm to touch. 2+ dorsalis pedis and radial  pulses. Musculoskeletal: No calf or leg pain. All major joints not erythematous nontender.  No upper or lower joint deformation.  Good ROM.  No contractures  Psychiatric: Intact judgment and insight. Pleasant and cooperative. Neurologic: No focal neurological deficits. Strength is 5/5 and symmetric in upper and lower extremities.  Cranial nerves II through XII are grossly intact.  Data Reviewed: Results for orders placed or performed during the hospital encounter of 07/21/22 (from the past 24 hour(s))  Lipase, blood     Status: Abnormal   Collection Time: 07/21/22 10:08 AM  Result Value Ref Range   Lipase 650 (H) 11 - 51 U/L  Comprehensive metabolic panel     Status: Abnormal   Collection Time: 07/21/22 10:08 AM  Result Value Ref Range   Sodium 136 135 - 145 mmol/L   Potassium 3.2 (L) 3.5 - 5.1 mmol/L   Chloride 94 (L) 98 - 111 mmol/L   CO2 30 22 - 32 mmol/L  Glucose, Bld 93 70 - 99 mg/dL   BUN 11 6 - 20 mg/dL   Creatinine, Ser 0.52 0.44 - 1.00 mg/dL   Calcium 9.0 8.9 - 10.3 mg/dL   Total Protein 9.3 (H) 6.5 - 8.1 g/dL   Albumin 3.1 (L) 3.5 - 5.0 g/dL   AST 44 (H) 15 - 41 U/L   ALT 16 0 - 44 U/L   Alkaline Phosphatase 98 38 - 126 U/L   Total Bilirubin 1.3 (H) 0.3 - 1.2 mg/dL   GFR, Estimated >60 >60 mL/min   Anion gap 12 5 - 15  CBC with Differential     Status: Abnormal   Collection Time: 07/21/22 10:13 AM  Result Value Ref Range   WBC 19.0 (H) 4.0 - 10.5 K/uL   RBC 4.91 3.87 - 5.11 MIL/uL   Hemoglobin 14.1 12.0 - 15.0 g/dL   HCT 42.6 36.0 - 46.0 %   MCV 86.8 80.0 - 100.0 fL   MCH 28.7 26.0 - 34.0 pg   MCHC 33.1 30.0 - 36.0 g/dL   RDW 17.6 (H) 11.5 - 15.5 %   Platelets 519 (H) 150 - 400 K/uL   nRBC 0.0 0.0 - 0.2 %   Neutrophils Relative % 83 %   Neutro Abs 15.6 (H) 1.7 - 7.7 K/uL   Lymphocytes Relative 12 %   Lymphs Abs 2.4 0.7 - 4.0 K/uL   Monocytes Relative 4 %   Monocytes Absolute 0.8 0.1 - 1.0 K/uL   Eosinophils Relative 1 %   Eosinophils Absolute 0.2 0.0 - 0.5  K/uL   Basophils Relative 0 %   Basophils Absolute 0.1 0.0 - 0.1 K/uL   Immature Granulocytes 0 %   Abs Immature Granulocytes 0.06 0.00 - 0.07 K/uL  Urinalysis, Routine w reflex microscopic Urine, Clean Catch     Status: Abnormal   Collection Time: 07/21/22 10:16 AM  Result Value Ref Range   Color, Urine AMBER (A) YELLOW   APPearance CLOUDY (A) CLEAR   Specific Gravity, Urine 1.029 1.005 - 1.030   pH 5.0 5.0 - 8.0   Glucose, UA NEGATIVE NEGATIVE mg/dL   Hgb urine dipstick NEGATIVE NEGATIVE   Bilirubin Urine SMALL (A) NEGATIVE   Ketones, ur 20 (A) NEGATIVE mg/dL   Protein, ur 100 (A) NEGATIVE mg/dL   Nitrite NEGATIVE NEGATIVE   Leukocytes,Ua NEGATIVE NEGATIVE   RBC / HPF 0-5 0 - 5 RBC/hpf   WBC, UA 11-20 0 - 5 WBC/hpf   Bacteria, UA RARE (A) NONE SEEN   Squamous Epithelial / LPF 21-50 0 - 5   Mucus PRESENT    Non Squamous Epithelial 0-5 (A) NONE SEEN  POC urine preg, ED     Status: None   Collection Time: 07/21/22 10:20 AM  Result Value Ref Range   Preg Test, Ur NEGATIVE NEGATIVE   CT ABDOMEN PELVIS W CONTRAST  Result Date: 07/21/2022 CLINICAL DATA:  Follow-up acute pancreatitis and splenic hematoma. Worsening abdominal pain for several days. Nausea and vomiting. EXAM: CT ABDOMEN AND PELVIS WITH CONTRAST TECHNIQUE: Multidetector CT imaging of the abdomen and pelvis was performed using the standard protocol following bolus administration of intravenous contrast. RADIATION DOSE REDUCTION: This exam was performed according to the departmental dose-optimization program which includes automated exposure control, adjustment of the mA and/or kV according to patient size and/or use of iterative reconstruction technique. CONTRAST:  111m OMNIPAQUE IOHEXOL 300 MG/ML  SOLN COMPARISON:  07/08/2022 FINDINGS: Lower Chest: Small left pleural effusion and left lower lobe atelectasis  have decreased since prior study. Hepatobiliary: A 7 mm hypervascular lesion is seen in the posterior liver dome. This  remains stable in retrospect compared to previous study, likely representing a flash-filling hemangioma. Gallbladder is unremarkable. No evidence of biliary ductal dilatation. Pancreas: Peripancreatic edema is again seen surrounding the pancreatic body and tail, consistent with acute pancreatitis. A 3.5 x 1.9 cm fluid collection is seen adjacent to the pancreatic tail, mildly increased from 2.2 x 1.9 cm previously. This is consistent with a pancreatic pseudocyst. No evidence of pancreatic ductal dilatation. Spleen: A large subcapsular hematoma is again seen along the lateral aspect of the spleen, which is slightly decreased in size. This currently measures 13.7 x 9.7 cm, compared to 14.3 x 10.5 cm previously. Adrenals/Urinary Tract: No suspicious masses identified. No evidence of ureteral calculi or hydronephrosis. Stomach/Bowel: No evidence of obstruction, inflammatory process or abnormal fluid collections. Normal appendix visualized. Vascular/Lymphatic: 12 mm lymph node in the porta hepatis remains stable, likely reactive in etiology. Thrombosis of the proximal splenic vein is again seen with venous collaterals in the left upper quadrant. Nonocclusive thrombus again seen throughout the portal veins, which is increased in the left portal vein since previous study. Reproductive:  No mass or other significant abnormality. Other:  None. Musculoskeletal:  No suspicious bone lesions identified. IMPRESSION: Moderate acute pancreatitis, without significant change. Mild increase in size of 3.5 cm pancreatic pseudocyst adjacent to the pancreatic tail. Mild decrease in size of large subcapsular splenic hematoma. 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 Nonocclusive thrombus throughout the portal veins, which is increased in the left portal vein since previous study. Decreased small left pleural effusion and left lower lobe atelectasis. Stable 7 mm hypervascular lesion in  posterior liver dome, likely representing a flash-filling hemangioma. Recommend continued attention on follow-up imaging. Electronically Signed   By: Marlaine Hind M.D.   On: 07/21/2022 14:28   US Abdomen Complete  Result Date: 07/21/2022 CLINICAL DATA:  Abdominal pain. Known acute pancreatitis, splenic hematoma, and pancreatic pseudocyst. Technologist indicates limited study due to patient pain tolerance, bowel gas, and limited mobility. EXAM: ABDOMEN ULTRASOUND COMPLETE COMPARISON:  CT abdomen and pelvis July 08, 2022 FINDINGS: Gallbladder: Layering echogenic material within the gallbladder, likely sludge. No gallstones or wall thickening visualized. Positive sonographic Murphy sign noted by the sonographer, however limited assessment in the setting acute pancreatitis. Common bile duct: Diameter: 7 mm Liver: Increased and heterogeneous hepatic parenchymal echogenicity. There is also a more focal masslike echogenic area in the left hepatic lobe that measures 5.4 x 5.3 x 5.1 cm. Near-occlusive thrombus in the main portal vein. IVC: No abnormality visualized. Pancreas: Limited visualization secondary to overlying bowel gas. The known pancreatic pseudocyst is not well seen. Spleen: Heterogeneous perisplenic fluid collection that measures approximately 11.3 x 6.1 x 7.0 cm. Right Kidney: Length: 12.2 cm. Echogenicity within normal limits. No mass or hydronephrosis visualized. Left Kidney: Length: 11.9 cm. Echogenicity within normal limits. No mass or hydronephrosis visualized. Abdominal aorta: No aneurysm visualized. Other findings: None. IMPRESSION: 1. Large perisplenic hematoma, better assessed on recent CT. 2. Near-occlusive thrombus in the main portal vein. 3. There is a 5.4 cm focal masslike echogenic area in the left hepatic lobe, incompletely assessed on ultrasound. Differential considerations include differential hepatic perfusion in the setting of portal vein thrombus, hematoma, or focal mass. Recommend  further assessment with liver MRI with and without contrast. 4. Gallbladder sludge without sonographic evidence of acute cholecystitis. The mildly dilated  common bile duct and positive sonographic Murphy sign noted by the sonographer is favored to be related to known acute pancreatitis. Electronically Signed   By: Beryle Flock M.D.   On: 07/21/2022 12:32      Assessment and Plan: No notes have been filed under this hospital service. Service: Hospitalist  Principal Problem:   Acute on chronic pancreatitis Active Problems:   Essential hypertension   Pancreatitis, acute   Hypokalemia   Liddle's syndrome   Pancreatic pseudocyst   Diabetes mellitus type II, controlled ([place])   Leukocytosis  Acute on chronic pancreatitis with pseudocyst Admit IV fluids -patient received 2 500 mL boluses in the ED.  Will give another liter now and then start maintenance fluids with normal saline and potassium Recheck lipase in the morning Consult GI Hypokalemia We will give potassium in IV fluids and recheck in the morning Leukocytosis Likely secondary to pancreatitis. Will check procalcitonin and lactic acid to ensure no underlying infection, particularly with the patient's right upper quadrant ultrasound showing sludge in the gallbladder and with her tenderness in the right upper quadrant. Diabetes Controlled off medications at this point Hypertension Continue antihypertensives   Advance Care Planning:   Code Status: Full Code full code confirmed by patient  Consults: GI  Family Communication: None  Severity of Illness: The appropriate patient status for this patient is INPATIENT. Inpatient status is judged to be reasonable and necessary in order to provide the required intensity of service to ensure the patient's safety. The patient's presenting symptoms, physical exam findings, and initial radiographic and laboratory data in the context of their chronic comorbidities is felt to place them at  high risk for further clinical deterioration. Furthermore, it is not anticipated that the patient will be medically stable for discharge from the hospital within 2 midnights of admission.   * I certify that at the point of admission it is my clinical judgment that the patient will require inpatient hospital care spanning beyond 2 midnights from the point of admission due to high intensity of service, high risk for further deterioration and high frequency of surveillance required.*  Author: Truett Mainland, DO 07/21/2022 4:04 PM  For on call review [email or web address].

## 2022-07-21 NOTE — ED Provider Notes (Addendum)
[facility] EMERGENCY DEPARTMENT Provider Note   CSN: [id number] Arrival date & time: 07/21/22  0957     History  Chief Complaint  Patient presents with   Abdominal Pain    Jillian Shepherd is a 36 y.o. female with a history including alcohol induced pancreatitis, no longer abusing alcohol but has had multiple recent hospitalizations secondary to complications related to her pancreatitis, specifically has history of splenic hemorrhage with splenic subcapsular hematoma, most recently she was discharged on December 8 where she was treated for acute pancreatitis but reports escalating pain in her epigastric region which radiates into her chest, described as burning in character, started 4 days ago and reminds her of pain symptoms she experienced when she was initially started on anticoagulants for splenic hematoma was initially diagnosed, after which the anticoagulants had to be DC'd.  She has had poor p.o. intake, she has been able to tolerate sips of fluid, she does state that any attempt at solid intake worsens her pain and also triggers an episode of diarrhea.  However she had been taking Colace secondary to constipation so she states the Colace may be the reason for her current diarrhea.  She has had no fevers or chills.  Her pain radiates into her back.  She denies shortness of breath, bloody stools, hematemesis.  She is found no alleviators for symptoms.  Of note,  pt reports Creon was prescribed by her pcp this week, it was not available yet at her pharmacy so has not started this yet.    The history is provided by the patient.       Home Medications Prior to Admission medications   Medication Sig Start Date End Date Taking? Authorizing Provider  amLODipine (NORVASC) 10 MG tablet Take 1 tablet (10 mg total) by mouth daily. Patient taking differently: Take 10 mg by mouth in the morning. 04/10/22   Rubie Maid, FNP  cyanocobalamin (VITAMIN B12) 1000 MCG/ML injection Inject 1 mL  (1,000 mcg total) into the muscle every 30 (thirty) days. 05/17/22   [name], MD  folic acid (FOLVITE) 1 MG tablet Take 1 tablet (1 mg total) by mouth daily. Patient taking differently: Take 1 mg by mouth in the morning. 05/17/22 05/12/23  [name], MD  ibuprofen (ADVIL) 200 MG tablet Take 400 mg by mouth daily as needed for headache or mild pain.    [provider]  lipase/protease/amylase (CREON) 36000 UNITS CPEP capsule Take 2 capsules (72,000 Units total) by mouth 3 (three) times daily with meals. May also take 1 capsule (36,000 Units total) as needed (with snacks). 07/20/22   Rubie Maid, FNP  melatonin 3 MG TABS tablet Take 2 tablets (6 mg total) by mouth at bedtime. 07/13/22 08/12/22  Shahmehdi, Valeria Batman, MD  menthol-cetylpyridinium (CEPACOL) 3 MG lozenge Take 1 lozenge (3 mg total) by mouth as needed for sore throat. 07/13/22   Shahmehdi, Valeria Batman, MD  metFORMIN (GLUCOPHAGE) 500 MG tablet Take 1 tablet (500 mg total) by mouth 2 (two) times daily with a meal. 05/18/22   Pickard, Cammie Mcgee, MD  ondansetron (ZOFRAN) 4 MG tablet Take 1 tablet (4 mg total) by mouth every 6 (six) hours as needed for nausea or vomiting. 07/13/22 08/12/22  ShahmehdiValeria Batman, MD  oxyCODONE-acetaminophen (PERCOCET/ROXICET) 5-325 MG tablet Take 1 tablet by mouth every 6 (six) hours as needed for up to 7 days for severe pain. 07/20/22 07/27/22  [name], MD  pantoprazole (PROTONIX) 40 MG tablet  Take 1 tablet (40 mg total) by mouth daily. 07/13/22 08/12/22  Deatra James, MD      Allergies    Patient has no known allergies.    Review of Systems   Review of Systems  Constitutional:  Positive for appetite change. Negative for fever.  HENT: Negative.    Respiratory:  Negative for chest tightness.   Gastrointestinal:  Positive for abdominal pain, diarrhea, nausea and vomiting.  Musculoskeletal: Negative.   All other systems reviewed and are negative.   Physical Exam Updated Vital  Signs BP 123/83   Pulse 94   Temp 98.3 F (36.8 C) (Oral)   Resp 20   Ht '5\' 3"'$  (1.6 m)   Wt 68.5 kg   LMP 07/01/2022 (Approximate)   SpO2 94%   BMI 26.75 kg/m  Physical Exam Vitals and nursing note reviewed.  Constitutional:      Appearance: She is well-developed.  HENT:     Head: Normocephalic and atraumatic.     Mouth/Throat:     Mouth: Mucous membranes are dry.  Eyes:     Conjunctiva/sclera: Conjunctivae normal.  Cardiovascular:     Rate and Rhythm: Regular rhythm.     Heart sounds: Normal heart sounds.  Pulmonary:     Effort: Pulmonary effort is normal.     Breath sounds: Normal breath sounds. No wheezing.  Abdominal:     General: Bowel sounds are normal.     Palpations: Abdomen is soft.     Tenderness: There is abdominal tenderness in the right upper quadrant and epigastric area. There is guarding. There is no rebound.  Musculoskeletal:        General: Normal range of motion.     Cervical back: Normal range of motion.  Skin:    General: Skin is warm and dry.  Neurological:     Mental Status: She is alert.     ED Results / Procedures / Treatments   Labs (all labs ordered are listed, but only abnormal results are displayed) Labs Reviewed  LIPASE, BLOOD - Abnormal; Notable for the following components:      Result Value   Lipase 650 (*)    All other components within normal limits  COMPREHENSIVE METABOLIC PANEL - Abnormal; Notable for the following components:   Potassium 3.2 (*)    Chloride 94 (*)    Total Protein 9.3 (*)    Albumin 3.1 (*)    AST 44 (*)    Total Bilirubin 1.3 (*)    All other components within normal limits  URINALYSIS, ROUTINE W REFLEX MICROSCOPIC - Abnormal; Notable for the following components:   Color, Urine AMBER (*)    APPearance CLOUDY (*)    Bilirubin Urine SMALL (*)    Ketones, ur 20 (*)    Protein, ur 100 (*)    Bacteria, UA RARE (*)    Non Squamous Epithelial 0-5 (*)    All other components within normal limits  CBC  WITH DIFFERENTIAL/PLATELET - Abnormal; Notable for the following components:   WBC 19.0 (*)    RDW 17.6 (*)    Platelets 519 (*)    Neutro Abs 15.6 (*)    All other components within normal limits  LACTIC ACID, PLASMA  LACTIC ACID, PLASMA  PROCALCITONIN  POC URINE PREG, ED    EKG None  Radiology CT ABDOMEN PELVIS W CONTRAST  Result Date: 07/21/2022 CLINICAL DATA:  Follow-up acute pancreatitis and splenic hematoma. Worsening abdominal pain for several days. Nausea and vomiting.  EXAM: CT ABDOMEN AND PELVIS WITH CONTRAST TECHNIQUE: Multidetector CT imaging of the abdomen and pelvis was performed using the standard protocol following bolus administration of intravenous contrast. RADIATION DOSE REDUCTION: This exam was performed according to the departmental dose-optimization program which includes automated exposure control, adjustment of the mA and/or kV according to patient size and/or use of iterative reconstruction technique. CONTRAST:  162m OMNIPAQUE IOHEXOL 300 MG/ML  SOLN COMPARISON:  07/08/2022 FINDINGS: Lower Chest: Small left pleural effusion and left lower lobe atelectasis have decreased since prior study. Hepatobiliary: A 7 mm hypervascular lesion is seen in the posterior liver dome. This remains stable in retrospect compared to previous study, likely representing a flash-filling hemangioma. Gallbladder is unremarkable. No evidence of biliary ductal dilatation. Pancreas: Peripancreatic edema is again seen surrounding the pancreatic body and tail, consistent with acute pancreatitis. A 3.5 x 1.9 cm fluid collection is seen adjacent to the pancreatic tail, mildly increased from 2.2 x 1.9 cm previously. This is consistent with a pancreatic pseudocyst. No evidence of pancreatic ductal dilatation. Spleen: A large subcapsular hematoma is again seen along the lateral aspect of the spleen, which is slightly decreased in size. This currently measures 13.7 x 9.7 cm, compared to 14.3 x 10.5 cm  previously. Adrenals/Urinary Tract: No suspicious masses identified. No evidence of ureteral calculi or hydronephrosis. Stomach/Bowel: No evidence of obstruction, inflammatory process or abnormal fluid collections. Normal appendix visualized. Vascular/Lymphatic: 12 mm lymph node in the porta hepatis remains stable, likely reactive in etiology. Thrombosis of the proximal splenic vein is again seen with venous collaterals in the left upper quadrant. Nonocclusive thrombus again seen throughout the portal veins, which is increased in the left portal vein since previous study. Reproductive:  No mass or other significant abnormality. Other:  None. Musculoskeletal:  No suspicious bone lesions identified. IMPRESSION: Moderate acute pancreatitis, without significant change. Mild increase in size of 3.5 cm pancreatic pseudocyst adjacent to the pancreatic tail. Mild decrease in size of large subcapsular splenic hematoma. 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 Nonocclusive thrombus throughout the portal veins, which is increased in the left portal vein since previous study. Decreased small left pleural effusion and left lower lobe atelectasis. Stable 7 mm hypervascular lesion in posterior liver dome, likely representing a flash-filling hemangioma. Recommend continued attention on follow-up imaging. Electronically Signed   By: JMarlaine HindM.D.   On: 07/21/2022 14:28   [place]domen Complete  Result Date: 07/21/2022 CLINICAL DATA:  Abdominal pain. Known acute pancreatitis, splenic hematoma, and pancreatic pseudocyst. Technologist indicates limited study due to patient pain tolerance, bowel gas, and limited mobility. EXAM: ABDOMEN ULTRASOUND COMPLETE COMPARISON:  CT abdomen and pelvis July 08, 2022 FINDINGS: Gallbladder: Layering echogenic material within the gallbladder, likely sludge. No gallstones or wall thickening visualized. Positive sonographic Murphy sign noted  by the sonographer, however limited assessment in the setting acute pancreatitis. Common bile duct: Diameter: 7 mm Liver: Increased and heterogeneous hepatic parenchymal echogenicity. There is also a more focal masslike echogenic area in the left hepatic lobe that measures 5.4 x 5.3 x 5.1 cm. Near-occlusive thrombus in the main portal vein. IVC: No abnormality visualized. Pancreas: Limited visualization secondary to overlying bowel gas. The known pancreatic pseudocyst is not well seen. Spleen: Heterogeneous perisplenic fluid collection that measures approximately 11.3 x 6.1 x 7.0 cm. Right Kidney: Length: 12.2 cm. Echogenicity within normal limits. No mass or hydronephrosis visualized. Left Kidney: Length: 11.9 cm. Echogenicity within normal limits. No mass or hydronephrosis  visualized. Abdominal aorta: No aneurysm visualized. Other findings: None. IMPRESSION: 1. Large perisplenic hematoma, better assessed on recent CT. 2. Near-occlusive thrombus in the main portal vein. 3. There is a 5.4 cm focal masslike echogenic area in the left hepatic lobe, incompletely assessed on ultrasound. Differential considerations include differential hepatic perfusion in the setting of portal vein thrombus, hematoma, or focal mass. Recommend further assessment with liver MRI with and without contrast. 4. Gallbladder sludge without sonographic evidence of acute cholecystitis. The mildly dilated common bile duct and positive sonographic Murphy sign noted by the sonographer is favored to be related to known acute pancreatitis. Electronically Signed   By: Beryle Flock M.D.   On: 07/21/2022 12:32    Procedures Procedures    Medications Ordered in ED Medications  sodium chloride 0.9 % bolus 1,000 mL (has no administration in time range)  morphine (PF) 4 MG/ML injection 4 mg (4 mg Intravenous Given 07/21/22 1127)  metoCLOPramide (REGLAN) injection 10 mg (10 mg Intravenous Given 07/21/22 1127)  sodium chloride 0.9 % bolus 500 mL  (0 mLs Intravenous Stopped 07/21/22 1249)  HYDROmorphone (DILAUDID) injection 1 mg (1 mg Intravenous Given 07/21/22 1352)  iohexol (OMNIPAQUE) 300 MG/ML solution 100 mL (100 mLs Intravenous Contrast Given 07/21/22 1344)    ED Course/ Medical Decision Making/ A&P                           Medical Decision Making Patient presenting with worsening abdominal pain consistent with her acute pancreatitis.  She was discharged from here 8 days ago with improving symptoms, now with worse pain in her epigastric region similar to the symptoms she experienced when she first developed an expanding splenic hematoma during hospital stay last month.  Unable to tolerate any solid input, has been able to tolerate fluids however.  An ultrasound was initially ordered in the hopes to avoid further CT radiation, however the size of the splenic hematoma was unable to be evaluated with this study, also it appeared that she had a completely occluded portal vein per ultrasound.  Therefore we proceeded to CT imaging.  Pt has failed outpatient treatment and will need admission for supportive care, fluids, pain control.   Amount and/or Complexity of Data Reviewed Labs: ordered.    Details: Labs are significant for WBC count of 19,000, she also has a significant elevation in her lipase today.  It is currently 650, it was 90 2 weeks ago. Radiology: ordered.    Details: CT imaging showing mild increase of a 3.5 cm pancreatic pseudocyst, over mild decrease in splenic hematoma stable splenic vein thrombosis.  Stable liver hemangioma. Discussion of management or test interpretation with external provider(s): Discussed with Dr. Nehemiah Settle who accepts pt for admission.  Risk Prescription drug management. Decision regarding hospitalization.           Final Clinical Impression(s) / ED Diagnoses Final diagnoses:  Alcohol-induced acute pancreatitis, unspecified complication status    Rx / DC Orders ED Discharge Orders      None         Landis Martins 07/21/22 1524    Evalee Jefferson, PA-C 07/21/22 1532    Carmin Muskrat, MD 07/21/22 [phone]

## 2022-07-21 NOTE — ED Triage Notes (Signed)
Pt c/o chronic pancreatitis with flare since Monday. Seen by pcp on Tuesday with no improvement. N/v/d.

## 2022-07-21 NOTE — ED Notes (Signed)
Pt requesting pain medication- informed pt that pain meds can be given every 3 hours, will administer according to this order.

## 2022-07-22 DIAGNOSIS — K852 Alcohol induced acute pancreatitis without necrosis or infection: Secondary | ICD-10-CM

## 2022-07-22 DIAGNOSIS — E876 Hypokalemia: Secondary | ICD-10-CM

## 2022-07-22 DIAGNOSIS — K859 Acute pancreatitis without necrosis or infection, unspecified: Secondary | ICD-10-CM | POA: Diagnosis not present

## 2022-07-22 DIAGNOSIS — K861 Other chronic pancreatitis: Secondary | ICD-10-CM | POA: Diagnosis not present

## 2022-07-22 DIAGNOSIS — I10 Essential (primary) hypertension: Secondary | ICD-10-CM | POA: Diagnosis not present

## 2022-07-22 DIAGNOSIS — I81 Portal vein thrombosis: Secondary | ICD-10-CM

## 2022-07-22 DIAGNOSIS — E119 Type 2 diabetes mellitus without complications: Secondary | ICD-10-CM

## 2022-07-22 LAB — COMPREHENSIVE METABOLIC PANEL
ALT: 13 U/L (ref 0–44)
AST: 29 U/L (ref 15–41)
Albumin: 2.3 g/dL — ABNORMAL LOW (ref 3.5–5.0)
Alkaline Phosphatase: 71 U/L (ref 38–126)
Anion gap: 10 (ref 5–15)
BUN: 9 mg/dL (ref 6–20)
CO2: 28 mmol/L (ref 22–32)
Calcium: 8 mg/dL — ABNORMAL LOW (ref 8.9–10.3)
Chloride: 99 mmol/L (ref 98–111)
Creatinine, Ser: 0.36 mg/dL — ABNORMAL LOW (ref 0.44–1.00)
GFR, Estimated: >60 mL/min (ref >60)
Glucose, Bld: 56 mg/dL — ABNORMAL LOW (ref 70–99)
Potassium: 2.6 mmol/L — CL (ref 3.5–5.1)
Sodium: 137 mmol/L (ref 135–145)
Total Bilirubin: 1.2 mg/dL (ref 0.3–1.2)
Total Protein: 6.8 g/dL (ref 6.5–8.1)

## 2022-07-22 LAB — PROCALCITONIN: Procalcitonin: <0.1 ng/mL

## 2022-07-22 LAB — ETHANOL: Alcohol, Ethyl (B): <10 mg/dL (ref <10)

## 2022-07-22 LAB — CBC
HCT: 32.5 % — ABNORMAL LOW (ref 36.0–46.0)
Hemoglobin: 10.4 g/dL — ABNORMAL LOW (ref 12.0–15.0)
MCH: 28.7 pg (ref 26.0–34.0)
MCHC: 32 g/dL (ref 30.0–36.0)
MCV: 89.5 fL (ref 80.0–100.0)
Platelets: 421 10*3/uL — ABNORMAL HIGH (ref 150–400)
RBC: 3.63 MIL/uL — ABNORMAL LOW (ref 3.87–5.11)
RDW: 17.2 % — ABNORMAL HIGH (ref 11.5–15.5)
WBC: 11.5 10*3/uL — ABNORMAL HIGH (ref 4.0–10.5)
nRBC: 0 % (ref 0.0–0.2)

## 2022-07-22 LAB — LIPASE, BLOOD: Lipase: 108 U/L — ABNORMAL HIGH (ref 11–51)

## 2022-07-22 MED ORDER — HYDROMORPHONE HCL 1 MG/ML IJ SOLN
2.0000 mg | INTRAMUSCULAR | Status: DC | PRN
  Administered 2022-07-22 – 2022-07-24 (×15): 2 mg via INTRAVENOUS
  Filled 2022-07-22 (×16): qty 2

## 2022-07-22 MED ORDER — HYDRALAZINE HCL 20 MG/ML IJ SOLN: 5.0000 mg | Freq: Four times a day (QID) | INTRAMUSCULAR | Status: DC | PRN

## 2022-07-22 MED ORDER — POTASSIUM CHLORIDE IN NACL 40-0.9 MEQ/L-% IV SOLN: INTRAVENOUS | Status: DC

## 2022-07-22 MED ORDER — HEPARIN (PORCINE) 25000 UT/250ML-% IV SOLN
1000.0000 [IU]/h | INTRAVENOUS | Status: DC
  Filled 2022-07-22: qty 250

## 2022-07-22 MED ORDER — POTASSIUM CHLORIDE 2 MEQ/ML IV SOLN
INTRAVENOUS | Status: DC
  Filled 2022-07-22 (×4): qty 1000

## 2022-07-22 MED ORDER — LACTATED RINGERS IV SOLN: INTRAVENOUS | Status: DC

## 2022-07-22 MED ORDER — HYDROMORPHONE HCL 1 MG/ML IJ SOLN: 1.0000 mg | INTRAMUSCULAR | Status: DC | PRN

## 2022-07-22 MED ORDER — DEXTROSE-NACL 5-0.9 % IV SOLN: INTRAVENOUS | Status: DC

## 2022-07-22 MED ORDER — ENOXAPARIN SODIUM 40 MG/0.4ML IJ SOSY: 40.0000 mg | PREFILLED_SYRINGE | INTRAMUSCULAR | Status: DC

## 2022-07-22 MED ORDER — POTASSIUM CHLORIDE 10 MEQ/100ML IV SOLN
10.0000 meq | INTRAVENOUS | Status: AC
  Administered 2022-07-22 (×5): 10 meq via INTRAVENOUS
  Filled 2022-07-22 (×2): qty 100

## 2022-07-22 NOTE — Progress Notes (Signed)
Discussed case with on call gastroenterologists at [facility]. Patient is not presenting any changes suggestive of bowel ischemia and abdominal pain is primarily due to recurrent pancreatitis. Even though there is portal thrombosis, this is non occlusive and concern for bowel injury is low. Given her possible risk of rebleeding, her candidacy to be anticoagulated is questionable. At this point, there is no imperative need for anticoagulation, so we will keep monitoring her clinically at [facility]. If she were to present worsening abdominal pain, may need to rescan her to assess if thrombosis has extended to mesenteric vasculature. We will not anticoagulate her at this point given restricted resources at [facility] if perisplenic hematoma were to worsen.

## 2022-07-22 NOTE — Consult Note (Signed)
Maylon Peppers, M.D. Gastroenterology & Hepatology                                           Patient Name: Jillian Shepherd Account #: _0 @   MRN: [id number] Admission Date: 07/21/2022 Date of Evaluation:  07/22/2022 Time of Evaluation: 9:12 AM   Referring Physician: Bess Harvest Ortiz,MD  Chief Complaint:  recurrent pancreatitis  HPI:  This is a 36 y.o. female  with history of alcohol abuse complicated by recurrent alcoholic pancreatitis, HTN, Liddle syndrome, splenic hematoma and splenic vein thrombosis, who came to the hospital due to worsening abdominal pain.  Gastroenterology was consulted due to recurrent pancreatitis.  The patient is well-known to our service due to recent hospitalization for recurrent pancreatitis.  At that time the patient had a second episode of pancreatitis despite abstaining from alcohol intake.  Her hospital course was prolonged as she was not able to tolerate too much oral intake and required to have an NG tube placed to improve her oral intake.  Ultimately, this could be removed and she was slowly advanced to soft diet.  Unfortunately, the patient had recurrence of abdominal pain shortly after she was discharged from the hospital.  States that she was only able to tolerate liquids but this caused significant pain in her upper abdomen, worse in the right upper quadrant.  She reports that she did not have any prescriptions for pain medication and started taking ibuprofen twice a day.  States that these did not improve her abdominal pain.  She had some episodes of nausea but did not have any more vomiting.  Due to poor pain control she decided to come to the hospital.  No melena, hematochezia, abdominal distention, fever or chills.  She is concerned as she has also lost significant amount of weight since her first episode of pancreatitis.   In the ED, she was slightly tachycardic 115 bpm but otherwise HD stable and afebrile. Labs were remarkable for  leukocytosis of 19,000 with hemoglobin of 14.1 and platelets of 519, CMP showed total bilirubin of 1.3, normal ALT of 16 with mildly elevated AST of 44, albumin 3.1, potassium was 3.2 with rest of electrolytes within normal limits, lipase was elevated at 650.  Urinalysis was largely unremarkable.  An abdominal ultrasound showed a near occlusive thrombus in the main portal vein, as well as a large perisplenic hematoma.  She had 5.4 cm lesion in the left hepatic lobe (unclear if this was related to altered perfusion due to portal vein thrombosis versus hematoma versus focal mass).  CT of the abdomen and pelvis with IV contrast showed presence of a nonocclusive thrombus throughout the portal veins worsened compared to prior study, stable thrombosis of the proximal splenic vein.  There was presence of moderate acute pancreatitis with mild increase in the 3.5 cm pancreatic pseudocyst in the pancreatic tail.  Past Medical History: SEE CHRONIC ISSSUES: Past Medical History:  Diagnosis Date   Abdominal pain    Alcohol abuse    B12 deficiency    Cancer ([place])    squmaous cell skin cancer    Collagen vascular disease (HCC)    Hypokalemia    Liddle's syndrome    Obesity (BMI 35.0-39.9 without comorbidity)    Pancreatitis    Pleural effusion    Splenic hemorrhage    Past Surgical History:  Past Surgical History:  Procedure Laterality Date  ADENOIDECTOMY     CHEST TUBE INSERTION     COLONOSCOPY WITH PROPOFOL N/A 10/06/2020   Procedure: COLONOSCOPY WITH PROPOFOL;  Surgeon: Lin Landsman, MD;  Location: [facility] ENDOSCOPY;  Service: Gastroenterology;  Laterality: N/A;  COVID POSITIVE 08/23/2020   ESOPHAGOGASTRODUODENOSCOPY (EGD) WITH PROPOFOL N/A 10/06/2020   Procedure: ESOPHAGOGASTRODUODENOSCOPY (EGD) WITH PROPOFOL;  Surgeon: Lin Landsman, MD;  Location: [place];  Service: Gastroenterology;  Laterality: N/A;   skin cancer removal Left    leg   TONSILLECTOMY     Family History:   Family History  Problem Relation Age of Onset   Hypertension Father    Anxiety disorder Father    Depression Father    Melanoma Paternal Grandmother        of skin   Diabetes Paternal Grandmother    Cirrhosis Paternal Uncle    Social History:  Social History   Tobacco Use   Smoking status: Every Day    Packs/day: 0.25    Years: 9.00    Total pack years: 2.25    Types: Cigarettes   Smokeless tobacco: Never  Vaping Use   Vaping Use: Never used  Substance Use Topics   Alcohol use: Yes    Alcohol/week: 2.0 standard drinks of alcohol    Types: 2 Cans of beer per week   Drug use: No    Home Medications:  Prior to Admission medications   Medication Sig Start Date End Date Taking? Authorizing Provider  amLODipine (NORVASC) 10 MG tablet Take 1 tablet (10 mg total) by mouth daily. Patient taking differently: Take 10 mg by mouth in the morning. 04/10/22  Yes Howard, Amber S, FNP  cyanocobalamin (VITAMIN B12) 1000 MCG/ML injection Inject 1 mL (1,000 mcg total) into the muscle every 30 (thirty) days. 05/17/22  Yes [name], MD  folic acid (FOLVITE) 1 MG tablet Take 1 tablet (1 mg total) by mouth daily. Patient taking differently: Take 1 mg by mouth in the morning. 05/17/22 05/12/23 Yes [name], MD  ibuprofen (ADVIL) 200 MG tablet Take 400 mg by mouth daily as needed for headache or mild pain.   Yes [provider]  melatonin 3 MG TABS tablet Take 2 tablets (6 mg total) by mouth at bedtime. 07/13/22 08/12/22 Yes Shahmehdi, Valeria Batman, MD  metFORMIN (GLUCOPHAGE) 500 MG tablet Take 1 tablet (500 mg total) by mouth 2 (two) times daily with a meal. 05/18/22  Yes Pickard, Cammie Mcgee, MD  ondansetron (ZOFRAN) 4 MG tablet Take 1 tablet (4 mg total) by mouth every 6 (six) hours as needed for nausea or vomiting. 07/13/22 08/12/22 Yes Shahmehdi, Seyed A, MD  pantoprazole (PROTONIX) 40 MG tablet Take 1 tablet (40 mg total) by mouth daily. 07/13/22 08/12/22 Yes Shahmehdi, Valeria Batman, MD   lipase/protease/amylase (CREON) 36000 UNITS CPEP capsule Take 2 capsules (72,000 Units total) by mouth 3 (three) times daily with meals. May also take 1 capsule (36,000 Units total) as needed (with snacks). 07/20/22   Rubie Maid, FNP  menthol-cetylpyridinium (CEPACOL) 3 MG lozenge Take 1 lozenge (3 mg total) by mouth as needed for sore throat. Patient not taking: Reported on 07/21/2022 07/13/22   Deatra James, MD  oxyCODONE-acetaminophen (PERCOCET/ROXICET) 5-325 MG tablet Take 1 tablet by mouth every 6 (six) hours as needed for up to 7 days for severe pain. Patient not taking: Reported on 07/21/2022 07/20/22 07/27/22  [name], MD    Inpatient Medications:  Current Facility-Administered Medications:    amLODipine (NORVASC)  tablet 10 mg, 10 mg, Oral, q AM, Truett Mainland, DO, 10 mg at 07/22/22 0700   dextrose 5 %-0.9 % sodium chloride infusion, , Intravenous, Continuous, Charlynne Cousins, MD   [START ON 07/23/2022] enoxaparin (LOVENOX) injection 40 mg, 40 mg, Subcutaneous, Q24H, Charlynne Cousins, MD   hydrALAZINE (APRESOLINE) injection 5 mg, 5 mg, Intravenous, Q6H PRN, Charlynne Cousins, MD   HYDROmorphone (DILAUDID) injection 1 mg, 1 mg, Intravenous, Q3H PRN, Truett Mainland, DO, 1 mg at 07/22/22 0836   ondansetron (ZOFRAN) tablet 4 mg, 4 mg, Oral, Q6H PRN **OR** ondansetron (ZOFRAN) injection 4 mg, 4 mg, Intravenous, Q6H PRN, Truett Mainland, DO, 4 mg at 07/22/22 0159   pantoprazole (PROTONIX) injection 40 mg, 40 mg, Intravenous, Q12H, Truett Mainland, DO, 40 mg at 07/22/22 4536   potassium chloride 10 mEq in 100 mL IVPB, 10 mEq, Intravenous, Q1 Hr x 5, Charlynne Cousins, MD Allergies: Patient has no known allergies.  Complete Review of Systems: GENERAL: negative for malaise, night sweats HEENT: No changes in hearing or vision, no nose bleeds or other nasal problems. NECK: Negative for lumps, goiter, pain and significant neck swelling RESPIRATORY:  Negative for cough, wheezing CARDIOVASCULAR: Negative for chest pain, leg swelling, palpitations, orthopnea GI: SEE HPI MUSCULOSKELETAL: Negative for joint pain or swelling, back pain, and muscle pain. SKIN: Negative for lesions, rash PSYCH: Negative for sleep disturbance, mood disorder and recent psychosocial stressors. HEMATOLOGY Negative for prolonged bleeding, bruising easily, and swollen nodes. ENDOCRINE: Negative for cold or heat intolerance, polyuria, polydipsia and goiter. NEURO: negative for tremor, gait imbalance, syncope and seizures. The remainder of the review of systems is noncontributory.  Physical Exam: BP (!) 147/94 (BP Location: Left Arm)   Pulse 82   Temp 97.7 F (36.5 C)   Resp 16   Ht _0  (1.6 m)   Wt 67.5 kg   LMP 07/01/2022 (Approximate)   SpO2 98%   BMI 26.38 kg/m  GENERAL: The patient is AO x3, in no acute distress. HEENT: Head is normocephalic and atraumatic. EOMI are intact. Mouth is well hydrated and without lesions. NECK: Supple. No masses LUNGS: Clear to auscultation. No presence of rhonchi/wheezing/rales. Adequate chest expansion HEART: RRR, normal s1 and s2. ABDOMEN: tender to palpation in the upper abdomen, no guarding, no peritoneal signs, and nondistended. BS +. No masses. EXTREMITIES: Without any cyanosis, clubbing, rash, lesions or edema. NEUROLOGIC: AOx3, no focal motor deficit. SKIN: no jaundice, no rashes  Laboratory Data CBC:     Component Value Date/Time   WBC 19.0 (H) 07/21/2022 1013   RBC 4.91 07/21/2022 1013   HGB 14.1 07/21/2022 1013   HCT 42.6 07/21/2022 1013   PLT 519 (H) 07/21/2022 1013   MCV 86.8 07/21/2022 1013   MCH 28.7 07/21/2022 1013   MCHC 33.1 07/21/2022 1013   RDW 17.6 (H) 07/21/2022 1013   LYMPHSABS 2.4 07/21/2022 1013   MONOABS 0.8 07/21/2022 1013   EOSABS 0.2 07/21/2022 1013   BASOSABS 0.1 07/21/2022 1013   COAG:  Lab Results  Component Value Date   INR 1.1 06/30/2022    BMP:     Latest Ref Rng &  Units 07/22/2022    6:21 AM 07/21/2022   10:08 AM 07/11/2022    4:52 AM  BMP  Glucose 70 - 99 mg/dL 56  93  88   BUN 6 - 20 mg/dL _1 Creatinine 0.44 - 1.00 mg/dL 0.36  0.52  0.39  Sodium 135 - 145 mmol/L 137  136  137   Potassium 3.5 - 5.1 mmol/L 2.6  3.2  4.0   Chloride 98 - 111 mmol/L 99  94  103   CO2 22 - 32 mmol/L _0 Calcium 8.9 - 10.3 mg/dL 8.0  9.0  7.9     HEPATIC:     Latest Ref Rng & Units 07/22/2022    6:21 AM 07/21/2022   10:08 AM 07/09/2022    5:04 AM  Hepatic Function  Total Protein 6.5 - 8.1 g/dL 6.8  9.3  6.4   Albumin 3.5 - 5.0 g/dL 2.3  3.1  2.2   AST 15 - 41 U/L 29  44  14   ALT 0 - 44 U/L _1 Alk Phosphatase 38 - 126 U/L 71  98  63   Total Bilirubin 0.3 - 1.2 mg/dL 1.2  1.3  0.5     CARDIAC:  Lab Results  Component Value Date   CKTOTAL 64 05/04/2019     Imaging: I personally reviewed and interpreted the available imaging.  Assessment & Plan: Jillian Shepherd is a 36 y.o. female  with history of alcohol abuse complicated by recurrent alcoholic pancreatitis, HTN, Liddle syndrome, splenic hematoma and splenic vein thrombosis, who came to the hospital due to worsening abdominal pain.  Gastroenterology was consulted due to recurrent pancreatitis.  Patient has presented worsening abdominal pain with poor tolerance to oral intake as this worsens her abdominal pain significantly.  She has been able to keep fluids down but her pain has been uncontrolled.  Given the elevated lipase and abnormalities on CT scan, I concur that she is presenting with recurrent pancreatitis.  This could have been exacerbated by her recent intake of Motrin as she denies drinking any alcohol.  Notably, her pancreatic tail pseudocyst has increased in size but is not causing any severe mass effect.  For now, we will keep her n.p.o. and we will keep her with IV fluid hydration with lactated Ringer's, pain control should be titrated with the use of opiates or Tylenol as  needed.  Notably, she has presented worsening nonocclusive portal vein thrombosis new compared to prior.  This could have also aggravated some of her abdominal discomfort.  I discussed with on call interventional radiologist (Dr. [facility]) regarding the possibility of attempting an intravascular thrombosis.  Given her recurrent pancreatitis, this will be a high risk intervention with a high likelihood of recurrence as the inflammation is likely promoting this thrombosis.  Due to this, her only option may be to attempt a new trial of heparin drip.  However, I consider that given her hospital size and Abilities, this may need to be attempted at a larger hospital such as [facility].  At this point, we will discuss with the patient if she is agreeable to proceed with this and start the transfer process if so.  - NPO - c/w lactated ringer @ 125 cc/h - Pain control per primary team - Discuss with patient if willing to attempt heparin drip trial. If so, will need to be transferred to [facility] - Avoid NSAIDs  Maylon Peppers, MD Gastroenterology and [facility]

## 2022-07-22 NOTE — Progress Notes (Addendum)
Patient requesting PRN Dilaudid at this time. Pain 8/10. Patient states it is sharp and a burning sensation. BP checked and stable for medication. Pain medication given. Will continue to monitor.

## 2022-07-22 NOTE — Progress Notes (Signed)
Patient alert and oriented x4. Patient requesting nausea medication at this time. IV Zofran given. Will continue to monitor.

## 2022-07-22 NOTE — Progress Notes (Signed)
[facility] PROGRESS NOTE    Progress Note  Jillian Shepherd  JSH:[id number] DOB: August 25, 1985 DOA: 07/21/2022 PCP: [name], MD     Brief Narrative:   Jillian Shepherd is an 36 y.o. female past medical history significant for an Liddle syndrome, obesity, alcoholic pancreatitis/chronic pancreatitis with pseudocyst with multiple recurrent admissions for acute pancreatitis, she was discharged 8 days prior to admission went home and unfortunately every time she tries to eat she will get abdominal pain with nausea and vomiting    Assessment/Plan:   Acute on chronic pancreatitis with a pseudocyst. Placed on IV fluids, n.p.o. and IV narcotics. Lipase was significantly elevated on admission. Procalcitonin is low yield. CT abdomen pelvis showed moderate acute pancreatitis mild increase in size of pseudocyst adjacent to the tail about 3.6 cm (is decreasing in size 4.4 cm compared to the study on 06/08/2022).  Mildly decreased large subscapular splenic hematoma with a stable thrombosis of the proximal splenic vein, nonocclusive thrombus throughout the portal vein which is increased in size in the left since prior study. Abdominal ultrasound showed gallbladder sludge without acute cholecystitis, dilated common bile duct. GI has been consulted. Change IV fluids to D5 with normal saline as she is becoming hypoglycemic. DC all insulins.  Check an A1c she does not have a history of diabetes mellitus.  Spleen hematoma: Is decreasing in size.  Hypokalemia: Likely due to nausea and vomiting replete IV recheck in the morning.  Leukocytosis: Likely secondary to acute pancreatitis, procalcitonin is low yield  Essential hypertension Blood pressure relatively stable, continue Norvasc IV hydralazine as needed.  Liddle's syndrome Noted  DVT prophylaxis: lovenox Family Communication:none Status is: Inpatient Remains inpatient appropriate because: Acute alcoholic pancreatitis with a  pseudocyst.    Code Status:     Code Status Orders  (From admission, onward)           Start     Ordered   07/21/22 1604  Full code  Continuous       Question:  By:  Answer:  Consent: discussion documented in EHR   07/21/22 1604           Code Status History     Date Active Date Inactive Code Status Order ID Comments User Context   07/01/2022 0314 07/13/2022 1503 Full Code [id number]  Rolla Plate, DO Inpatient   05/31/2022 1638 06/14/2022 2220 Full Code [id number]  Kathie Dike, MD Inpatient   05/10/2022 0212 05/13/2022 1910 Full Code [id number]  Bernadette Hoit, DO Inpatient   01/22/2015 0439 01/23/2015 1525 Full Code [id number]  Malachy Mood, MD Inpatient   01/21/2015 1423 01/22/2015 0439 Full Code [id number]  Malachy Mood, MD Inpatient   01/20/2015 1410 01/21/2015 1423 Full Code [id number]  Gae Dry, MD Inpatient   01/20/2015 1235 01/20/2015 1410 Full Code [id number]  Louisa Second, CNM Inpatient         IV Access:   Peripheral IV   Procedures and diagnostic studies:   CT ABDOMEN PELVIS W CONTRAST  Result Date: 07/21/2022 CLINICAL DATA:  Follow-up acute pancreatitis and splenic hematoma. Worsening abdominal pain for several days. Nausea and vomiting. EXAM: CT ABDOMEN AND PELVIS WITH CONTRAST TECHNIQUE: Multidetector CT imaging of the abdomen and pelvis was performed using the standard protocol following bolus administration of intravenous contrast. RADIATION DOSE REDUCTION: This exam was performed according to the departmental dose-optimization program which includes automated exposure control, adjustment of the mA and/or kV according to patient size and/or use of iterative reconstruction technique. CONTRAST:  152m  OMNIPAQUE IOHEXOL 300 MG/ML  SOLN COMPARISON:  07/08/2022 FINDINGS: Lower Chest: Small left pleural effusion and left lower lobe atelectasis have decreased since prior study. Hepatobiliary: A 7 mm hypervascular lesion is seen in the  posterior liver dome. This remains stable in retrospect compared to previous study, likely representing a flash-filling hemangioma. Gallbladder is unremarkable. No evidence of biliary ductal dilatation. Pancreas: Peripancreatic edema is again seen surrounding the pancreatic body and tail, consistent with acute pancreatitis. A 3.5 x 1.9 cm fluid collection is seen adjacent to the pancreatic tail, mildly increased from 2.2 x 1.9 cm previously. This is consistent with a pancreatic pseudocyst. No evidence of pancreatic ductal dilatation. Spleen: A large subcapsular hematoma is again seen along the lateral aspect of the spleen, which is slightly decreased in size. This currently measures 13.7 x 9.7 cm, compared to 14.3 x 10.5 cm previously. Adrenals/Urinary Tract: No suspicious masses identified. No evidence of ureteral calculi or hydronephrosis. Stomach/Bowel: No evidence of obstruction, inflammatory process or abnormal fluid collections. Normal appendix visualized. Vascular/Lymphatic: 12 mm lymph node in the porta hepatis remains stable, likely reactive in etiology. Thrombosis of the proximal splenic vein is again seen with venous collaterals in the left upper quadrant. Nonocclusive thrombus again seen throughout the portal veins, which is increased in the left portal vein since previous study. Reproductive:  No mass or other significant abnormality. Other:  None. Musculoskeletal:  No suspicious bone lesions identified. IMPRESSION: Moderate acute pancreatitis, without significant change. Mild increase in size of 3.5 cm pancreatic pseudocyst adjacent to the pancreatic tail. Mild decrease in size of large subcapsular splenic hematoma. 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 Nonocclusive thrombus throughout the portal veins, which is increased in the left portal vein since previous study. Decreased small left pleural effusion and left lower lobe atelectasis. Stable 7  mm hypervascular lesion in posterior liver dome, likely representing a flash-filling hemangioma. Recommend continued attention on follow-up imaging. Electronically Signed   By: Marlaine Hind M.D.   On: 07/21/2022 14:28   US Abdomen Complete  Result Date: 07/21/2022 CLINICAL DATA:  Abdominal pain. Known acute pancreatitis, splenic hematoma, and pancreatic pseudocyst. Technologist indicates limited study due to patient pain tolerance, bowel gas, and limited mobility. EXAM: ABDOMEN ULTRASOUND COMPLETE COMPARISON:  CT abdomen and pelvis July 08, 2022 FINDINGS: Gallbladder: Layering echogenic material within the gallbladder, likely sludge. No gallstones or wall thickening visualized. Positive sonographic Murphy sign noted by the sonographer, however limited assessment in the setting acute pancreatitis. Common bile duct: Diameter: 7 mm Liver: Increased and heterogeneous hepatic parenchymal echogenicity. There is also a more focal masslike echogenic area in the left hepatic lobe that measures 5.4 x 5.3 x 5.1 cm. Near-occlusive thrombus in the main portal vein. IVC: No abnormality visualized. Pancreas: Limited visualization secondary to overlying bowel gas. The known pancreatic pseudocyst is not well seen. Spleen: Heterogeneous perisplenic fluid collection that measures approximately 11.3 x 6.1 x 7.0 cm. Right Kidney: Length: 12.2 cm. Echogenicity within normal limits. No mass or hydronephrosis visualized. Left Kidney: Length: 11.9 cm. Echogenicity within normal limits. No mass or hydronephrosis visualized. Abdominal aorta: No aneurysm visualized. Other findings: None. IMPRESSION: 1. Large perisplenic hematoma, better assessed on recent CT. 2. Near-occlusive thrombus in the main portal vein. 3. There is a 5.4 cm focal masslike echogenic area in the left hepatic lobe, incompletely assessed on ultrasound. Differential considerations include differential hepatic perfusion in the setting of portal vein thrombus,  hematoma, or focal mass. Recommend  further assessment with liver MRI with and without contrast. 4. Gallbladder sludge without sonographic evidence of acute cholecystitis. The mildly dilated common bile duct and positive sonographic Murphy sign noted by the sonographer is favored to be related to known acute pancreatitis. Electronically Signed   By: Beryle Flock M.D.   On: 07/21/2022 12:32     Medical Consultants:   None.   Subjective:    [name] having abdominal pain she relates her pain is controlled, with current medication.  Objective:    Vitals:   07/21/22 2244 07/22/22 0042 07/22/22 0049 07/22/22 0331  BP: 125/70 (!) 142/108  (!) 147/94  Pulse: 80 98  82  Resp: 16 16    Temp: 98.2 F (36.8 C) 97.7 F (36.5 C)    TempSrc: Oral     SpO2: 96% 98%    Weight:   67.5 kg   Height:   '5\' 3"'$  (1.6 m)    SpO2: 98 % O2 Flow Rate (L/min): 2 L/min FiO2 (%): 28 %   Intake/Output Summary (Last 24 hours) at 07/22/2022 0840 Last data filed at 07/22/2022 0532 Gross per 24 hour  Intake 1630.04 ml  Output --  Net 1630.04 ml   Filed Weights   07/21/22 1005 07/22/22 0049  Weight: 68.5 kg 67.5 kg    Exam: General exam: In no acute distress. Respiratory system: Good air movement and clear to auscultation. Cardiovascular system: S1 & S2 heard, RRR. No JVD. Gastrointestinal system: Abdomen is nondistended, soft and nontender.  Extremities: No pedal edema. Skin: No rashes, lesions or ulcers Psychiatry: Judgement and insight appear normal. Mood & affect appropriate.    Data Reviewed:    Labs: Basic Metabolic Panel: Recent Labs  Lab 07/21/22 1008 07/22/22 0621  NA 136 137  K 3.2* 2.6*  CL 94* 99  CO2 30 28  GLUCOSE 93 56*  BUN 11 9  CREATININE 0.52 0.36*  CALCIUM 9.0 8.0*   GFR Estimated Creatinine Clearance: 88.8 mL/min (A) (by C-G formula based on SCr of 0.36 mg/dL (L)). Liver Function Tests: Recent Labs  Lab 07/21/22 1008 07/22/22 0621  AST 44* 29   ALT 16 13  ALKPHOS 98 71  BILITOT 1.3* 1.2  PROT 9.3* 6.8  ALBUMIN 3.1* 2.3*   Recent Labs  Lab 07/21/22 1008 07/22/22 0621  LIPASE 650* 108*   No results for input(s): "AMMONIA" in the last 168 hours. Coagulation profile No results for input(s): "INR", "PROTIME" in the last 168 hours. COVID-19 Labs  No results for input(s): "DDIMER", "FERRITIN", "LDH", "CRP" in the last 72 hours.  Lab Results  Component Value Date   SARSCOV2NAA NEGATIVE 06/30/2022   SARSCOV2NAA POSITIVE (A) 08/23/2020    CBC: Recent Labs  Lab 07/21/22 1013  WBC 19.0*  NEUTROABS 15.6*  HGB 14.1  HCT 42.6  MCV 86.8  PLT 519*   Cardiac Enzymes: No results for input(s): "CKTOTAL", "CKMB", "CKMBINDEX", "TROPONINI" in the last 168 hours. BNP (last 3 results) No results for input(s): "PROBNP" in the last 8760 hours. CBG: No results for input(s): "GLUCAP" in the last 168 hours. D-Dimer: No results for input(s): "DDIMER" in the last 72 hours. Hgb A1c: No results for input(s): "HGBA1C" in the last 72 hours. Lipid Profile: No results for input(s): "CHOL", "HDL", "LDLCALC", "TRIG", "CHOLHDL", "LDLDIRECT" in the last 72 hours. Thyroid function studies: No results for input(s): "TSH", "T4TOTAL", "T3FREE", "THYROIDAB" in the last 72 hours.  Invalid input(s): "FREET3" Anemia work up: No results for input(s): "VITAMINB12", "FOLATE", "FERRITIN", "  TIBC", "IRON", "RETICCTPCT" in the last 72 hours. Sepsis Labs: Recent Labs  Lab 07/21/22 1013 07/21/22 1602 07/21/22 1810  PROCALCITON  --  <0.10  --   WBC 19.0*  --   --   LATICACIDVEN  --  0.8 0.6   Microbiology No results found for this or any previous visit (from the past 240 hour(s)).   Medications:    amLODipine  10 mg Oral q AM   enoxaparin (LOVENOX) injection  40 mg Subcutaneous Q24H   pantoprazole (PROTONIX) IV  40 mg Intravenous Q12H   Continuous Infusions:  0.9 % NaCl with KCl 40 mEq / L 125 mL/hr at 07/22/22 0429      LOS: 1 day    Charlynne Cousins  [facility]  07/22/2022, 8:40 AM

## 2022-07-22 NOTE — TOC Progression Note (Signed)
Transition of Care [facility]) - Progression Note    Patient Details  Name: Renia Mikelson MRN: [id number] Date of Birth: [birth year]  Transition of [facility] CM/SW Contact  Salome Arnt, [place] Phone Number: 07/22/2022, 10:35 AM  Clinical Narrative:   Transition of Care (TOC) Screening Note   Patient Details  Name: Nandana Krolikowski Date of Birth: [birth year]   Transition of Care [facility]) CM/SW Contact:    Salome Arnt, [place] Phone Number: 07/22/2022, 10:35 AM    Transition of Care Department [facility]) has reviewed patient and no TOC needs have been identified at this time. We will continue to monitor patient advancement through interdisciplinary progression rounds. If new patient transition needs arise, please place a TOC consult.            Expected Discharge Plan and Services                                                 Social Determinants of Health (SDOH) Interventions    Readmission Risk Interventions     No data to display

## 2022-07-22 NOTE — Progress Notes (Signed)
[place] for heparin Indication:  portal vein thrombus  No Known Allergies  Patient Measurements: Height: '5\' 3"'$  (160 cm) Weight: 67.5 kg (148 lb 14.4 oz) IBW/kg (Calculated) : 52.4 Heparin Dosing Weight: 67kg  Vital Signs: Temp: 98.3 F (36.8 C) (12/17 1346) Temp Source: Oral (12/17 1346) BP: 137/88 (12/17 1346) Pulse Rate: 103 (12/17 1346)  Labs: Recent Labs    07/21/22 1008 07/21/22 1013 07/22/22 0621  HGB  --  14.1  --   HCT  --  42.6  --   PLT  --  519*  --   CREATININE 0.52  --  0.36*    Estimated Creatinine Clearance: 88.8 mL/min (A) (by C-G formula based on SCr of 0.36 mg/dL (L)).   Medical History: Past Medical History:  Diagnosis Date   Abdominal pain    Alcohol abuse    B12 deficiency    Cancer ([place])    squmaous cell skin cancer    Collagen vascular disease (H[facility])    Hypokalemia    Liddle's syndrome    Obesity (BMI 35.0-39.9 without comorbidity)    Pancreatitis    Pleural effusion    Splenic hemorrhage      Assessment: 62 yoF admitted with recurrent acute pancreatitis. CT showed portal vein thrombus worsening compared to prior study. Pharmacy asked to dose IV heparin with no bolus. Given splenic hematoma will target lower end of therapeutic range for heparin. No CBC today, wnl on admit yesterday.  Goal of Therapy:  Heparin level 0.3-0.5 units/ml Monitor platelets by anticoagulation protocol: Yes   Plan:  Heparin 1000 units/h no bolus Check heparin level in 6h  Arrie Senate, PharmD, [place], [facility] Clinical Pharmacist Please check AMION for all [place] numbers 07/22/2022

## 2022-07-23 ENCOUNTER — Inpatient Hospital Stay: Payer: 59 | Admitting: Family Medicine

## 2022-07-23 DIAGNOSIS — K859 Acute pancreatitis without necrosis or infection, unspecified: Secondary | ICD-10-CM | POA: Diagnosis not present

## 2022-07-23 DIAGNOSIS — E119 Type 2 diabetes mellitus without complications: Secondary | ICD-10-CM | POA: Diagnosis not present

## 2022-07-23 DIAGNOSIS — I10 Essential (primary) hypertension: Secondary | ICD-10-CM | POA: Diagnosis not present

## 2022-07-23 DIAGNOSIS — K861 Other chronic pancreatitis: Secondary | ICD-10-CM | POA: Diagnosis not present

## 2022-07-23 DIAGNOSIS — K852 Alcohol induced acute pancreatitis without necrosis or infection: Secondary | ICD-10-CM | POA: Diagnosis not present

## 2022-07-23 LAB — CBC
HCT: 35 % — ABNORMAL LOW (ref 36.0–46.0)
HCT: 34.5 % — ABNORMAL LOW (ref 36.0–46.0)
Hemoglobin: 11 g/dL — ABNORMAL LOW (ref 12.0–15.0)
Hemoglobin: 11.1 g/dL — ABNORMAL LOW (ref 12.0–15.0)
MCH: 28.3 pg (ref 26.0–34.0)
MCH: 28.8 pg (ref 26.0–34.0)
MCHC: 31.4 g/dL (ref 30.0–36.0)
MCHC: 32.2 g/dL (ref 30.0–36.0)
MCV: 90 fL (ref 80.0–100.0)
MCV: 89.6 fL (ref 80.0–100.0)
Platelets: 444 10*3/uL — ABNORMAL HIGH (ref 150–400)
Platelets: 419 10*3/uL — ABNORMAL HIGH (ref 150–400)
RBC: 3.89 MIL/uL (ref 3.87–5.11)
RBC: 3.85 MIL/uL — ABNORMAL LOW (ref 3.87–5.11)
RDW: 17.3 % — ABNORMAL HIGH (ref 11.5–15.5)
RDW: 17.3 % — ABNORMAL HIGH (ref 11.5–15.5)
WBC: 11.9 10*3/uL — ABNORMAL HIGH (ref 4.0–10.5)
WBC: 10.8 10*3/uL — ABNORMAL HIGH (ref 4.0–10.5)
nRBC: 0 % (ref 0.0–0.2)
nRBC: 0 % (ref 0.0–0.2)

## 2022-07-23 LAB — BASIC METABOLIC PANEL
Anion gap: 6 (ref 5–15)
BUN: 8 mg/dL (ref 6–20)
CO2: 31 mmol/L (ref 22–32)
Calcium: 8.4 mg/dL — ABNORMAL LOW (ref 8.9–10.3)
Chloride: 99 mmol/L (ref 98–111)
Creatinine, Ser: 0.38 mg/dL — ABNORMAL LOW (ref 0.44–1.00)
GFR, Estimated: >60 mL/min (ref >60)
Glucose, Bld: 96 mg/dL (ref 70–99)
Potassium: 3.3 mmol/L — ABNORMAL LOW (ref 3.5–5.1)
Sodium: 136 mmol/L (ref 135–145)

## 2022-07-23 LAB — PROCALCITONIN: Procalcitonin: <0.1 ng/mL

## 2022-07-23 LAB — HEMOGLOBIN A1C
Hgb A1c MFr Bld: 5 % (ref 4.8–5.6)
Mean Plasma Glucose: 97 mg/dL

## 2022-07-23 MED ORDER — POTASSIUM CHLORIDE 2 MEQ/ML IV SOLN
INTRAVENOUS | Status: AC
  Filled 2022-07-23 (×2): qty 1000

## 2022-07-23 MED ORDER — BOOST / RESOURCE BREEZE PO LIQD CUSTOM
1.0000 | Freq: Three times a day (TID) | ORAL | Status: AC
  Administered 2022-07-23 – 2022-07-25 (×6): 1 via ORAL

## 2022-07-23 MED ORDER — ADULT MULTIVITAMIN W/MINERALS CH
1.0000 | ORAL_TABLET | Freq: Every day | ORAL | Status: DC
  Administered 2022-07-23 – 2022-07-25 (×3): 1 via ORAL
  Filled 2022-07-23 (×3): qty 1

## 2022-07-23 MED ORDER — PROSOURCE PLUS PO LIQD
30.0000 mL | Freq: Two times a day (BID) | ORAL | Status: AC
  Administered 2022-07-24 – 2022-07-25 (×4): 30 mL via ORAL
  Filled 2022-07-23 (×4): qty 30

## 2022-07-23 NOTE — Plan of Care (Signed)
  Problem: Education: Goal: Knowledge of General Education information will improve Description Including pain rating scale, medication(s)/side effects and non-pharmacologic comfort measures Outcome: Progressing   

## 2022-07-23 NOTE — Progress Notes (Signed)
[facility] PROGRESS NOTE    Progress Note  Jillian Shepherd  IWL:[id number] DOB: Nov 07, 1985 DOA: 07/21/2022 PCP: [name], MD     Brief Narrative:   Jillian Shepherd is an 36 y.o. female past medical history significant for an Liddle syndrome, obesity, alcoholic pancreatitis/chronic pancreatitis with pseudocyst with multiple recurrent admissions for acute pancreatitis, she was discharged 8 days prior to admission went home and unfortunately every time she tries to eat she will get abdominal pain with nausea and vomiting, CT abdomen pelvis showed moderate acute pancreatitis mild increase in size of pseudocyst adjacent to the tail about 3.6 cm (is decreasing in size 4.4 cm compared to the study on 06/08/2022). Mildly decreased large subscapular splenic hematoma with a stable thrombosis of the proximal splenic vein, nonocclusive thrombus throughout the portal vein which is increased in size in the left since prior study. Abdominal ultrasound showed gallbladder sludge without acute cholecystitis, dilated common bile duct . GI consulted. Recommended conservative management.    Assessment/Plan:   Acute on chronic pancreatitis with a pseudocyst. Continue to keep her n.p.o., continue IV fluids and narcotics. She relates her pain is improved. Relates her pain is controlled. Continue to avoid NSAIDs.  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 No signs of overt leading. Further management per GI.  Spleen hematoma: Is decreasing in size.  Hypokalemia: Improved with IV repletion recheck in the morning.  Leukocytosis: Has remained afebrile probably reactive.  Essential hypertension Blood pressure relatively stable, continue Norvasc IV and hydralazine as needed.  Liddle's syndrome Noted  DVT prophylaxis: lovenox Family Communication:none Status is: Inpatient Remains inpatient  appropriate because: Acute alcoholic pancreatitis with a pseudocyst.    Code Status:     Code Status Orders  (From admission, onward)           Start     Ordered   07/21/22 1604  Full code  Continuous       Question:  By:  Answer:  Consent: discussion documented in EHR   07/21/22 1604           Code Status History     Date Active Date Inactive Code Status Order ID Comments User Context   07/01/2022 0314 07/13/2022 1503 Full Code [id number]  Rolla Plate, DO Inpatient   05/31/2022 1638 06/14/2022 2220 Full Code [id number]  Kathie Dike, MD Inpatient   05/10/2022 0212 05/13/2022 1910 Full Code [id number]  Bernadette Hoit, DO Inpatient   01/22/2015 0439 01/23/2015 1525 Full Code [id number]  Malachy Mood, MD Inpatient   01/21/2015 1423 01/22/2015 0439 Full Code [id number]  Malachy Mood, MD Inpatient   01/20/2015 1410 01/21/2015 1423 Full Code [id number]  Gae Dry, MD Inpatient   01/20/2015 1235 01/20/2015 1410 Full Code [id number]  Louisa Second, CNM Inpatient         IV Access:   Peripheral IV   Procedures and diagnostic studies:   CT ABDOMEN PELVIS W CONTRAST  Result Date: 07/21/2022 CLINICAL DATA:  Follow-up acute pancreatitis and splenic hematoma. Worsening abdominal pain for several days. Nausea and vomiting. EXAM: CT ABDOMEN AND PELVIS WITH CONTRAST TECHNIQUE: Multidetector CT imaging of the abdomen and pelvis was performed using the standard protocol following bolus administration of intravenous contrast. RADIATION DOSE REDUCTION: This exam was performed according to the departmental dose-optimization program which includes automated exposure control, adjustment of the mA and/or kV according to patient size and/or use of iterative reconstruction technique. CONTRAST:  174m OMNIPAQUE IOHEXOL 300 MG/ML  SOLN COMPARISON:  07/08/2022 FINDINGS: Lower Chest: Small left pleural effusion and left lower lobe atelectasis have decreased since prior study.  Hepatobiliary: A 7 mm hypervascular lesion is seen in the posterior liver dome. This remains stable in retrospect compared to previous study, likely representing a flash-filling hemangioma. Gallbladder is unremarkable. No evidence of biliary ductal dilatation. Pancreas: Peripancreatic edema is again seen surrounding the pancreatic body and tail, consistent with acute pancreatitis. A 3.5 x 1.9 cm fluid collection is seen adjacent to the pancreatic tail, mildly increased from 2.2 x 1.9 cm previously. This is consistent with a pancreatic pseudocyst. No evidence of pancreatic ductal dilatation. Spleen: A large subcapsular hematoma is again seen along the lateral aspect of the spleen, which is slightly decreased in size. This currently measures 13.7 x 9.7 cm, compared to 14.3 x 10.5 cm previously. Adrenals/Urinary Tract: No suspicious masses identified. No evidence of ureteral calculi or hydronephrosis. Stomach/Bowel: No evidence of obstruction, inflammatory process or abnormal fluid collections. Normal appendix visualized. Vascular/Lymphatic: 12 mm lymph node in the porta hepatis remains stable, likely reactive in etiology. Thrombosis of the proximal splenic vein is again seen with venous collaterals in the left upper quadrant. Nonocclusive thrombus again seen throughout the portal veins, which is increased in the left portal vein since previous study. Reproductive:  No mass or other significant abnormality. Other:  None. Musculoskeletal:  No suspicious bone lesions identified. IMPRESSION: Moderate acute pancreatitis, without significant change. Mild increase in size of 3.5 cm pancreatic pseudocyst adjacent to the pancreatic tail. Mild decrease in size of large subcapsular splenic hematoma. 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 Nonocclusive thrombus throughout the portal veins, which is increased in the left portal vein since previous study. Decreased small left  pleural effusion and left lower lobe atelectasis. Stable 7 mm hypervascular lesion in posterior liver dome, likely representing a flash-filling hemangioma. Recommend continued attention on follow-up imaging. Electronically Signed   By: JMarlaine HindM.D.   On: 07/21/2022 14:28   [place]domen Complete  Result Date: 07/21/2022 CLINICAL DATA:  Abdominal pain. Known acute pancreatitis, splenic hematoma, and pancreatic pseudocyst. Technologist indicates limited study due to patient pain tolerance, bowel gas, and limited mobility. EXAM: ABDOMEN ULTRASOUND COMPLETE COMPARISON:  CT abdomen and pelvis July 08, 2022 FINDINGS: Gallbladder: Layering echogenic material within the gallbladder, likely sludge. No gallstones or wall thickening visualized. Positive sonographic Murphy sign noted by the sonographer, however limited assessment in the setting acute pancreatitis. Common bile duct: Diameter: 7 mm Liver: Increased and heterogeneous hepatic parenchymal echogenicity. There is also a more focal masslike echogenic area in the left hepatic lobe that measures 5.4 x 5.3 x 5.1 cm. Near-occlusive thrombus in the main portal vein. IVC: No abnormality visualized. Pancreas: Limited visualization secondary to overlying bowel gas. The known pancreatic pseudocyst is not well seen. Spleen: Heterogeneous perisplenic fluid collection that measures approximately 11.3 x 6.1 x 7.0 cm. Right Kidney: Length: 12.2 cm. Echogenicity within normal limits. No mass or hydronephrosis visualized. Left Kidney: Length: 11.9 cm. Echogenicity within normal limits. No mass or hydronephrosis visualized. Abdominal aorta: No aneurysm visualized. Other findings: None. IMPRESSION: 1. Large perisplenic hematoma, better assessed on recent CT. 2. Near-occlusive thrombus in the main portal vein. 3. There is a 5.4 cm focal masslike echogenic area in the left hepatic lobe, incompletely assessed on ultrasound. Differential considerations include differential hepatic  perfusion in the setting of portal vein thrombus, hematoma, or focal mass.  Recommend further assessment with liver MRI with and without contrast. 4. Gallbladder sludge without sonographic evidence of acute cholecystitis. The mildly dilated common bile duct and positive sonographic Murphy sign noted by the sonographer is favored to be related to known acute pancreatitis. Electronically Signed   By: Beryle Flock M.D.   On: 07/21/2022 12:32     Medical Consultants:   None.   Subjective:    Jillian Shepherd relates her pain is controlled, she also relates she is hungry.  Objective:    Vitals:   07/22/22 1346 07/22/22 1944 07/23/22 0459 07/23/22 0750  BP: 137/88 116/73 113/77 112/77  Pulse: (!) 103 94 95 88  Resp: '17 16 16 18  '$ Temp: 98.3 F (36.8 C) 98.6 F (37 C) 98.9 F (37.2 C) 98.5 F (36.9 C)  TempSrc: Oral   Oral  SpO2: 90% 94% 92% 91%  Weight:      Height:       SpO2: 91 % O2 Flow Rate (L/min): 2 L/min FiO2 (%): 28 %   Intake/Output Summary (Last 24 hours) at 07/23/2022 0911 Last data filed at 07/23/2022 0500 Gross per 24 hour  Intake 851.25 ml  Output --  Net 851.25 ml    Filed Weights   07/21/22 1005 07/22/22 0049  Weight: 68.5 kg 67.5 kg    Exam: General exam: In no acute distress. Respiratory system: Good air movement and clear to auscultation. Cardiovascular system: S1 & S2 heard, RRR. No JVD. Gastrointestinal system: Soft nondistended diffuse tenderness but improved compared to yesterday. Extremities: No pedal edema. Skin: No rashes, lesions or ulcers Psychiatry: Judgement and insight appear normal. Mood & affect appropriate.   Data Reviewed:    Labs: Basic Metabolic Panel: Recent Labs  Lab 07/21/22 1008 07/22/22 0621 07/23/22 0543  NA 136 137 136  K 3.2* 2.6* 3.3*  CL 94* 99 99  CO2 '30 28 31  '$ GLUCOSE 93 56* 96  BUN '11 9 8  '$ CREATININE 0.52 0.36* 0.38*  CALCIUM 9.0 8.0* 8.4*    GFR Estimated Creatinine Clearance: 88.8 mL/min  (A) (by C-G formula based on SCr of 0.38 mg/dL (L)). Liver Function Tests: Recent Labs  Lab 07/21/22 1008 07/22/22 0621  AST 44* 29  ALT 16 13  ALKPHOS 98 71  BILITOT 1.3* 1.2  PROT 9.3* 6.8  ALBUMIN 3.1* 2.3*    Recent Labs  Lab 07/21/22 1008 07/22/22 0621  LIPASE 650* 108*    No results for input(s): "AMMONIA" in the last 168 hours. Coagulation profile No results for input(s): "INR", "PROTIME" in the last 168 hours. COVID-19 Labs  No results for input(s): "DDIMER", "FERRITIN", "LDH", "CRP" in the last 72 hours.  Lab Results  Component Value Date   SARSCOV2NAA NEGATIVE 06/30/2022   SARSCOV2NAA POSITIVE (A) 08/23/2020    CBC: Recent Labs  Lab 07/21/22 1013 07/22/22 2006 07/23/22 0543  WBC 19.0* 11.5* 11.9*  NEUTROABS 15.6*  --   --   HGB 14.1 10.4* 11.0*  HCT 42.6 32.5* 35.0*  MCV 86.8 89.5 90.0  PLT 519* 421* 444*    Cardiac Enzymes: No results for input(s): "CKTOTAL", "CKMB", "CKMBINDEX", "TROPONINI" in the last 168 hours. BNP (last 3 results) No results for input(s): "PROBNP" in the last 8760 hours. CBG: No results for input(s): "GLUCAP" in the last 168 hours. D-Dimer: No results for input(s): "DDIMER" in the last 72 hours. Hgb A1c: No results for input(s): "HGBA1C" in the last 72 hours. Lipid Profile: No results for input(s): "CHOL", "HDL", "LDLCALC", "TRIG", "  CHOLHDL", "LDLDIRECT" in the last 72 hours. Thyroid function studies: No results for input(s): "TSH", "T4TOTAL", "T3FREE", "THYROIDAB" in the last 72 hours.  Invalid input(s): "FREET3" Anemia work up: No results for input(s): "VITAMINB12", "FOLATE", "FERRITIN", "TIBC", "IRON", "RETICCTPCT" in the last 72 hours. Sepsis Labs: Recent Labs  Lab 07/21/22 1013 07/21/22 1602 07/21/22 1810 07/22/22 0621 07/22/22 2006 07/23/22 0543  PROCALCITON  --  <0.10  --  <0.10  --  <0.10  WBC 19.0*  --   --   --  11.5* 11.9*  LATICACIDVEN  --  0.8 0.6  --   --   --     Microbiology No results  found for this or any previous visit (from the past 240 hour(s)).   Medications:    amLODipine  10 mg Oral q AM   pantoprazole (PROTONIX) IV  40 mg Intravenous Q12H   Continuous Infusions:  dextrose 5 % 1,000 mL with potassium chloride 40 mEq infusion 75 mL/hr at 07/23/22 0748      LOS: 2 days   Charlynne Cousins  [facility]  07/23/2022, 9:11 AM

## 2022-07-23 NOTE — Progress Notes (Signed)
Gastroenterology Progress Note   Patient ID: Courtnay Petrilla; [id number]; 1985-11-21    Subjective   Abdominal pain improved. Nausea improved. Would like to try clear liquids.    Objective   Vital signs in last 24 hours Temp:  [98.3 F (36.8 C)-98.9 F (37.2 C)] 98.5 F (36.9 C) (12/18 0750) Pulse Rate:  [88-103] 88 (12/18 0750) Resp:  [16-18] 18 (12/18 0750) BP: (112-137)/(73-88) 112/77 (12/18 0750) SpO2:  [90 %-94 %] 91 % (12/18 0750) Last BM Date : 07/21/22  Physical Exam General:   Alert and oriented, pleasant Head:  Normocephalic and atraumatic. Abdomen:  Bowel sounds present, soft, mild TTP epigastric   Msk:  Symmetrical without gross deformities. Normal posture. Psych:  Alert and cooperative. Normal mood and affect.  Intake/Output from previous day: 12/17 0701 - 12/18 0700 In: 851.3 [I.V.:851.3] Out: -  Intake/Output this shift: No intake/output data recorded.  Lab Results  Recent Labs    07/21/22 1013 07/22/22 2006 07/23/22 0543  WBC 19.0* 11.5* 11.9*  HGB 14.1 10.4* 11.0*  HCT 42.6 32.5* 35.0*  PLT 519* 421* 444*   BMET Recent Labs    07/21/22 1008 07/22/22 0621 07/23/22 0543  NA 136 137 136  K 3.2* 2.6* 3.3*  CL 94* 99 99  CO2 '30 28 31  '$ GLUCOSE 93 56* 96  BUN '11 9 8  '$ CREATININE 0.52 0.36* 0.38*  CALCIUM 9.0 8.0* 8.4*   LFT Recent Labs    07/21/22 1008 07/22/22 0621  PROT 9.3* 6.8  ALBUMIN 3.1* 2.3*  AST 44* 29  ALT 16 13  ALKPHOS 98 71  BILITOT 1.3* 1.2    Studies/Results CT ABDOMEN PELVIS W CONTRAST  Result Date: 07/21/2022 CLINICAL DATA:  Follow-up acute pancreatitis and splenic hematoma. Worsening abdominal pain for several days. Nausea and vomiting. EXAM: CT ABDOMEN AND PELVIS WITH CONTRAST TECHNIQUE: Multidetector CT imaging of the abdomen and pelvis was performed using the standard protocol following bolus administration of intravenous contrast. RADIATION DOSE REDUCTION: This exam was performed according to the  departmental dose-optimization program which includes automated exposure control, adjustment of the mA and/or kV according to patient size and/or use of iterative reconstruction technique. CONTRAST:  1107m OMNIPAQUE IOHEXOL 300 MG/ML  SOLN COMPARISON:  07/08/2022 FINDINGS: Lower Chest: Small left pleural effusion and left lower lobe atelectasis have decreased since prior study. Hepatobiliary: A 7 mm hypervascular lesion is seen in the posterior liver dome. This remains stable in retrospect compared to previous study, likely representing a flash-filling hemangioma. Gallbladder is unremarkable. No evidence of biliary ductal dilatation. Pancreas: Peripancreatic edema is again seen surrounding the pancreatic body and tail, consistent with acute pancreatitis. A 3.5 x 1.9 cm fluid collection is seen adjacent to the pancreatic tail, mildly increased from 2.2 x 1.9 cm previously. This is consistent with a pancreatic pseudocyst. No evidence of pancreatic ductal dilatation. Spleen: A large subcapsular hematoma is again seen along the lateral aspect of the spleen, which is slightly decreased in size. This currently measures 13.7 x 9.7 cm, compared to 14.3 x 10.5 cm previously. Adrenals/Urinary Tract: No suspicious masses identified. No evidence of ureteral calculi or hydronephrosis. Stomach/Bowel: No evidence of obstruction, inflammatory process or abnormal fluid collections. Normal appendix visualized. Vascular/Lymphatic: 12 mm lymph node in the porta hepatis remains stable, likely reactive in etiology. Thrombosis of the proximal splenic vein is again seen with venous collaterals in the left upper quadrant. Nonocclusive thrombus again seen throughout the portal veins, which is increased in the left portal vein since  previous study. Reproductive:  No mass or other significant abnormality. Other:  None. Musculoskeletal:  No suspicious bone lesions identified. IMPRESSION: Moderate acute pancreatitis, without significant change.  Mild increase in size of 3.5 cm pancreatic pseudocyst adjacent to the pancreatic tail. Mild decrease in size of large subcapsular splenic hematoma. 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 Nonocclusive thrombus throughout the portal veins, which is increased in the left portal vein since previous study. Decreased small left pleural effusion and left lower lobe atelectasis. Stable 7 mm hypervascular lesion in posterior liver dome, likely representing a flash-filling hemangioma. Recommend continued attention on follow-up imaging. Electronically Signed   By: Marlaine Hind M.D.   On: 07/21/2022 14:28   US Abdomen Complete  Result Date: 07/21/2022 CLINICAL DATA:  Abdominal pain. Known acute pancreatitis, splenic hematoma, and pancreatic pseudocyst. Technologist indicates limited study due to patient pain tolerance, bowel gas, and limited mobility. EXAM: ABDOMEN ULTRASOUND COMPLETE COMPARISON:  CT abdomen and pelvis July 08, 2022 FINDINGS: Gallbladder: Layering echogenic material within the gallbladder, likely sludge. No gallstones or wall thickening visualized. Positive sonographic Murphy sign noted by the sonographer, however limited assessment in the setting acute pancreatitis. Common bile duct: Diameter: 7 mm Liver: Increased and heterogeneous hepatic parenchymal echogenicity. There is also a more focal masslike echogenic area in the left hepatic lobe that measures 5.4 x 5.3 x 5.1 cm. Near-occlusive thrombus in the main portal vein. IVC: No abnormality visualized. Pancreas: Limited visualization secondary to overlying bowel gas. The known pancreatic pseudocyst is not well seen. Spleen: Heterogeneous perisplenic fluid collection that measures approximately 11.3 x 6.1 x 7.0 cm. Right Kidney: Length: 12.2 cm. Echogenicity within normal limits. No mass or hydronephrosis visualized. Left Kidney: Length: 11.9 cm. Echogenicity within normal limits. No mass or  hydronephrosis visualized. Abdominal aorta: No aneurysm visualized. Other findings: None. IMPRESSION: 1. Large perisplenic hematoma, better assessed on recent CT. 2. Near-occlusive thrombus in the main portal vein. 3. There is a 5.4 cm focal masslike echogenic area in the left hepatic lobe, incompletely assessed on ultrasound. Differential considerations include differential hepatic perfusion in the setting of portal vein thrombus, hematoma, or focal mass. Recommend further assessment with liver MRI with and without contrast. 4. Gallbladder sludge without sonographic evidence of acute cholecystitis. The mildly dilated common bile duct and positive sonographic Murphy sign noted by the sonographer is favored to be related to known acute pancreatitis. Electronically Signed   By: Beryle Flock M.D.   On: 07/21/2022 12:32       Assessment  36 y.o. female with a history of  alcohol abuse complicated by recurrent alcoholic pancreatitis, HTN, Liddle syndrome, splenic hematoma and splenic vein thrombosis, presenting this admission with worsening abdominal pain due to recurrent pancreatitis. Well-known to GI with recent hospitalization for recurrent pancreatitis.   Recurrent pancreatitis: denies alcohol intake. Pancreatic tail pseudocyst has increased in size. Worsening non-occlusive portal vein thrombosis could be contributing to abdominal pain. Clinically improved today and eager to try clear liquids. Due to risk of bleeding in light of splenic hematoma, holding off on anticoagulation.   If she were to have worsening abdominal pain, would need CT to assess if thrombosis has extended to mesenteric vasculature.    Plan / Recommendations  Clear liquids Start pancreatic enzymes once tolerating diet PPI BID Will continue to follow with you    LOS: 2 days    07/23/2022, 12:25 PM  Annitta Needs, PhD, ANP-BC [facility]

## 2022-07-23 NOTE — Discharge Instructions (Signed)
Pancreatitis Nutrition Therapy  The pancreas helps your body digest and absorb nutrients in food. Pancreatitis prevents the body from digesting food well, especially if the food is high in fat.  This nutrition therapy limits the fat in your diet while providing nutrients you need. Your goal is to eat as near to a normal diet as possible without experiencing gastrointestinal (GI) symptoms. These symptoms include stomach pain, bloating, weight loss or difficulty maintaining weight, vomiting, burping, loose stools, and steatorrhea. The effects of pancreatitis are different for all individuals, so it is important to work with your registered dietitian nutritionist (RDN) to determine which foods trigger your GI symptoms. Your RDN can also help you figure out your tolerance to fat in foods and how to manage your symptoms with your diet.  Tips You may need to take pancreatic enzymes if you have frequent, loose stools after mealtimes. Individuals who take pancreatic enzymes will need to take the prescribed dosage at the start of a meal or snack. Individuals who are prescribed a low-fat diet will need to limit fats and oils to no more than 6 teaspoons (30 grams) daily. Up to 1 ounce of avocado can also be substituted for 1 teaspoon of fat. A low-fat diet may not be needed if you are taking pancreatic enzymes. Avoid drinking alcohol. Keep a bottle of water with you at all times to stay hydrated and ensure you are getting in enough fluid each day. The general recommendation is to drink 8 cups (64 ounces) of fluids daily. Eat small, frequent meals (4-6) throughout the day to help you recover from pancreatitis or to maintain your normal body weight. Eat more whole fruits and vegetables rather than drinking fruit and vegetable juices.  Fiber is found in whole grain foods and slows digestion. You may need to choose whole grain foods less often if you feel full quickly after eating. Ask your RDN for recommendations on  managing your diet if you also have other conditions, such as diabetes mellitus. Your RDN might recommend a vitamin and mineral supplement or fat-soluble vitamin supplements if you require higher amounts of these nutrients.  Foods Recommended and Foods Not Recommended The following list of foods may be helpful if you need to limit your fat intake. Food Group Foods Recommended Foods Not Recommended  Grains Breads: Bagels, buns, English muffins Hot/cold cereals Couscous Low-fat crackers Pancakes Pasta Popcorn, air popped Rice Corn or flour tortilla Products made with added fat (such as biscuits, waffles, and regular crackers) High-fat bakery products such as doughnuts, biscuits, croissants, Danish pastries, pies, cookies Snacks made with partially hydrogenated oils including chips, cheese puffs, snack mixes, regular crackers, butter-flavored popcorn  Protein Foods Lean cuts of poultry (without skin) such as chicken or turkey Low-fat hamburger (for example, 7% fat) Lean cuts of fish (white fish) Canned tuna in water Egg whites or egg substitute Lean deli meats such as turkey, chicken, lean beef Non-animal protein sources (tofu, legumes, beans, lentils) Smooth nut butters     Higher-fat cuts of meats such as ribs, T-bone steak, regular hamburger (15% to 20% fat) Full-fat processed meats (hot dogs, bologna, salami, sausage, bacon, etc) Red meats Organ meats (liver, brains, sweetbreads) Poultry with skin Fried meat, poultry, tofu, and fish Whole eggs and egg yolks Full-fat refried beans Tree nuts and peanuts  Dairy and Dairy Alternatives 1% or fat-free dairy (milk, yogurt, cheese, cottage cheese, sour cream) Frozen yogurt Fortified non-dairy milk (almond, rice, soy, etc.) Creamy/cheesy sauces Cream Whole-fat or reduced-fat (2%) dairy (  milk, yogurt, ice cream, cheese) Milkshakes Half-and-half Cream cheese Sour cream Coconut milk  Vegetables All fresh, frozen, or canned  vegetables Fried or stir-fried vegetables Vegetables prepared with butter, cheese, or cream sauce  Fruit All fresh, frozen, or canned fruit Fried fruits Fruit served with butter or cream  Fats and Oils All vegetable oils   Butter, stick margarine, shortening, partially hydrogenated oils, tropical oils (coconut, palm, and palm kernel oils)   Pancreatitis Sample 1-Day Menu View Nutrient Info Breakfast 2 egg whites, cooked 1 whole wheat bagel 1 tablespoon low-fat cream cheese 1 cup fat-free milk  cup blueberries  Lunch 2 slices bread 2 ounces turkey breast 2 leaves lettuce 2 slices tomato 1 teaspoon mustard 2 teaspoons nonfat mayonnaise 1 cup carrots  cup pineapple 1 cup fat-free milk  Evening Meal 3 ounces tilapia, baked  cup cooked rice  cup zucchini 1 cup lettuce for salad 2 teaspoons fat-free Italian dressing, for salad 1 dinner roll 1 teaspoon margarine  Evening Snack  cup low-fat yogurt 1 cup strawberries 1 ounce pretzels  Daily Sum Nutrient Unit Value  Macronutrients  Energy kcal 1560  Energy kJ 6541  Protein g 103  Total lipid (fat) g 23  Carbohydrate, by difference g 241  Fiber, total dietary g 23  Sugars, total g 104  Minerals  Calcium, Ca mg 1159  Iron, Fe mg 12  Sodium, Na mg 2034  Vitamins  Vitamin C, total ascorbic acid mg 158  Vitamin A, IU IU 28445  Vitamin D IU 457  Lipids  Fatty acids, total saturated g 6  Fatty acids, total monounsaturated g 6  Fatty acids, total polyunsaturated g 8  Cholesterol mg 123      

## 2022-07-23 NOTE — Progress Notes (Signed)
Initial Nutrition Assessment  DOCUMENTATION CODES:   Non-severe (moderate) malnutrition in context of chronic illness  INTERVENTION:  Boost Breeze po TID, each supplement provides 250 kcal and 9 grams of protein 30 ml ProSource Plus TID, each supplement provides 100 kcals and 15 grams protein.  MVI with minerals daily Monitor magnesium, potassium, and phosphorus BID for at least 3 days, MD to replete as needed, as pt is at risk for refeeding syndrome given poor PO intake >/=7 days If unable to tolerate diet advancement, recommend alternate nutrition access to meet nutrition needs "Pancreatitis Nutrition Therapy" handout added to AVS to help pt with nutritional intake after discharge  NUTRITION DIAGNOSIS:   Moderate Malnutrition related to chronic illness (chronic pancreatitis) as evidenced by percent weight loss, mild fat depletion, mild muscle depletion, moderate muscle depletion.  GOAL:   Patient will meet greater than or equal to 90% of their needs  MONITOR:   PO intake, Supplement acceptance, Diet advancement, Labs, Weight trends  REASON FOR ASSESSMENT:   Malnutrition Screening Tool    ASSESSMENT:   Pt admitted with abdominal pain r/t to acute on chronic pancreatitis. Pt with multiple prior recent admissions for pancreatitis. PMH significant for Liddle syndrome, alcoholic/chronic pancreatitis with pseudocysts.  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 Advanced diet to clear liquids today. Plans to start pt on pancreatic enzymes once tolerating diet.   Pt reports eating jello and broth for lunch. So far endorses tolerance. She is hesitant to eat much for fear of abdominal pain, nausea or emesis. She states that since her last admission she has lost about 20 lbs as she has stayed primarily on a full/clear liquid diet d/t fear of intolerance. She has been  consuming vegetable broth and jello. She tried yogurt but states that she did not tolerate this well. Explained to pt she will likely still continue with some mild abdominal pain with foods. Provided handout for patient to help guide food choices for best tolerance.   She did not like Ensure supplements provided during prior admission. She is agreeable to [employer] and ProSource. Pt is highly concerned about her weight loss and nutritional status as her mom passed away at 61 y.o with GI issues.   Reviewed weight history. Her weight appears to have been trending down within the last 6 months. Her documented weight on 06/25 was 90.7 kg and is currently noted to be 67.5 kg. This is a weight loss of 25.6% which is clinically significant for time frame.   Medications: IV protonix, D5 with KCl '@75ml'$ /hr  Labs: potassium 3.3, Cr 0.38  NUTRITION - FOCUSED PHYSICAL EXAM:  Flowsheet Row Most Recent Value  Orbital Region Mild depletion  Upper Arm Region Moderate depletion  Thoracic and Lumbar Region No depletion  Buccal Region Mild depletion  Temple Region Mild depletion  Clavicle Bone Region Mild depletion  Clavicle and Acromion Bone Region No depletion  Scapular Bone Region Mild depletion  Dorsal Hand Mild depletion  Patellar Region Moderate depletion  Anterior Thigh Region Moderate depletion  Posterior Calf Region No depletion  Edema (RD Assessment) Mild  [BUE (L>R)]  Hair Reviewed  Eyes Reviewed  Mouth Reviewed  Skin Reviewed  Nails Reviewed       Diet Order:   Diet Order             Diet clear liquid Room service appropriate? Yes; Fluid consistency: Thin  Diet effective  now                   EDUCATION NEEDS:   Education needs have been addressed  Skin:  Skin Assessment: Reviewed RN Assessment  Last BM:  12/18 (type 5 small)  Height:   Ht Readings from Last 1 Encounters:  07/22/22 '5\' 3"'$  (1.6 m)    Weight:   Wt Readings from Last 1 Encounters:  07/22/22 67.5  kg   BMI:  Body mass index is 26.38 kg/m.  Estimated Nutritional Needs:   Kcal:  1800-2000  Protein:  100-115g  Fluid:  >/=1.8L  Clayborne Dana, RDN, LDN Clinical Nutrition

## 2022-07-24 ENCOUNTER — Inpatient Hospital Stay: Payer: Self-pay

## 2022-07-24 DIAGNOSIS — I81 Portal vein thrombosis: Secondary | ICD-10-CM

## 2022-07-24 DIAGNOSIS — K859 Acute pancreatitis without necrosis or infection, unspecified: Secondary | ICD-10-CM | POA: Diagnosis not present

## 2022-07-24 DIAGNOSIS — K852 Alcohol induced acute pancreatitis without necrosis or infection: Secondary | ICD-10-CM | POA: Diagnosis not present

## 2022-07-24 DIAGNOSIS — K861 Other chronic pancreatitis: Secondary | ICD-10-CM | POA: Diagnosis not present

## 2022-07-24 LAB — CBC
HCT: 31.7 % — ABNORMAL LOW (ref 36.0–46.0)
HCT: 33.6 % — ABNORMAL LOW (ref 36.0–46.0)
Hemoglobin: 10.2 g/dL — ABNORMAL LOW (ref 12.0–15.0)
Hemoglobin: 10.6 g/dL — ABNORMAL LOW (ref 12.0–15.0)
MCH: 28.9 pg (ref 26.0–34.0)
MCH: 28 pg (ref 26.0–34.0)
MCHC: 32.2 g/dL (ref 30.0–36.0)
MCHC: 31.5 g/dL (ref 30.0–36.0)
MCV: 89.8 fL (ref 80.0–100.0)
MCV: 88.9 fL (ref 80.0–100.0)
Platelets: 402 10*3/uL — ABNORMAL HIGH (ref 150–400)
Platelets: 441 10*3/uL — ABNORMAL HIGH (ref 150–400)
RBC: 3.53 MIL/uL — ABNORMAL LOW (ref 3.87–5.11)
RBC: 3.78 MIL/uL — ABNORMAL LOW (ref 3.87–5.11)
RDW: 17.1 % — ABNORMAL HIGH (ref 11.5–15.5)
RDW: 17.3 % — ABNORMAL HIGH (ref 11.5–15.5)
WBC: 10.5 10*3/uL (ref 4.0–10.5)
WBC: 10.1 10*3/uL (ref 4.0–10.5)
nRBC: 0 % (ref 0.0–0.2)
nRBC: 0 % (ref 0.0–0.2)

## 2022-07-24 LAB — MAGNESIUM
Magnesium: 1.5 mg/dL — ABNORMAL LOW (ref 1.7–2.4)
Magnesium: 1.5 mg/dL — ABNORMAL LOW (ref 1.7–2.4)

## 2022-07-24 LAB — PHOSPHORUS
Phosphorus: 3.5 mg/dL (ref 2.5–4.6)
Phosphorus: 2.9 mg/dL (ref 2.5–4.6)

## 2022-07-24 LAB — POTASSIUM
Potassium: 3.3 mmol/L — ABNORMAL LOW (ref 3.5–5.1)
Potassium: 3.7 mmol/L (ref 3.5–5.1)

## 2022-07-24 MED ORDER — POLYETHYLENE GLYCOL 3350 17 G PO PACK
17.0000 g | PACK | Freq: Two times a day (BID) | ORAL | Status: AC
  Administered 2022-07-24 – 2022-07-25 (×4): 17 g via ORAL
  Filled 2022-07-24 (×4): qty 1

## 2022-07-24 MED ORDER — HYDROMORPHONE HCL 1 MG/ML IJ SOLN
1.0000 mg | INTRAMUSCULAR | Status: DC | PRN
  Administered 2022-07-24 – 2022-07-25 (×11): 1 mg via INTRAVENOUS
  Filled 2022-07-24 (×10): qty 1

## 2022-07-24 MED ORDER — KCL IN DEXTROSE-NACL 40-5-0.45 MEQ/L-%-% IV SOLN: INTRAVENOUS | Status: AC

## 2022-07-24 MED ORDER — POTASSIUM CHLORIDE 10 MEQ/100ML IV SOLN
10.0000 meq | INTRAVENOUS | Status: AC
  Administered 2022-07-24 (×5): 10 meq via INTRAVENOUS
  Filled 2022-07-24 (×5): qty 100

## 2022-07-24 MED ORDER — POTASSIUM CHLORIDE 2 MEQ/ML IV SOLN
INTRAVENOUS | Status: DC
  Filled 2022-07-24 (×2): qty 1000

## 2022-07-24 NOTE — Plan of Care (Signed)
  Problem: Clinical Measurements: Goal: Ability to maintain clinical measurements within normal limits will improve Outcome: Progressing   

## 2022-07-24 NOTE — Progress Notes (Signed)
[facility] PROGRESS NOTE    Progress Note  Frady Taddeo  DPO:[id number] DOB: 12/31/1985 DOA: 07/21/2022 PCP: [name], MD     Brief Narrative:   Jillian Shepherd is an 36 y.o. female past medical history significant for an Liddle syndrome, obesity, alcoholic pancreatitis/chronic pancreatitis with pseudocyst with multiple recurrent admissions for acute pancreatitis, she was discharged 8 days prior to admission went home and unfortunately every time she tries to eat she will get abdominal pain with nausea and vomiting, CT abdomen pelvis showed moderate acute pancreatitis mild increase in size of pseudocyst adjacent to the tail about 3.6 cm (is decreasing in size 4.4 cm compared to the study on 06/08/2022). Mildly decreased large subscapular splenic hematoma with a stable thrombosis of the proximal splenic vein, nonocclusive thrombus throughout the portal vein which is increased in size in the left since prior study. Abdominal ultrasound showed gallbladder sludge without acute cholecystitis, dilated common bile duct . GI consulted. Recommended conservative management.  Assessment/Plan:   Acute on chronic pancreatitis with a pseudocyst. On clear liq diet, decrease narcotics.  I will leave her on a clear liquid diet for the next several days. She relates her pain is improved. Relates her pain is controlled. Continue to avoid NSAIDs. Further management per GI.  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 No signs of overt leading. Further management per GI.  Spleen hematoma: Is decreasing in size.  Hypokalemia: Replete IV recheck tomorrow morning.  Leukocytosis: Has remained afebrile probably reactive.  Essential hypertension Blood pressure relatively stable, continue Norvasc IV and hydralazine as needed.  Liddle's syndrome Noted  DVT prophylaxis: lovenox Family  Communication:none Status is: Inpatient Remains inpatient appropriate because: Acute alcoholic pancreatitis with a pseudocyst.    Code Status:     Code Status Orders  (From admission, onward)           Start     Ordered   07/21/22 1604  Full code  Continuous       Question:  By:  Answer:  Consent: discussion documented in EHR   07/21/22 1604           Code Status History     Date Active Date Inactive Code Status Order ID Comments User Context   07/01/2022 0314 07/13/2022 1503 Full Code [id number]  Rolla Plate, DO Inpatient   05/31/2022 1638 06/14/2022 2220 Full Code [id number]  Kathie Dike, MD Inpatient   05/10/2022 0212 05/13/2022 1910 Full Code [id number]  Bernadette Hoit, DO Inpatient   01/22/2015 0439 01/23/2015 1525 Full Code [id number]  Malachy Mood, MD Inpatient   01/21/2015 1423 01/22/2015 0439 Full Code [id number]  Malachy Mood, MD Inpatient   01/20/2015 1410 01/21/2015 1423 Full Code [id number]  Gae Dry, MD Inpatient   01/20/2015 1235 01/20/2015 1410 Full Code [id number]  Louisa Second, CNM Inpatient         IV Access:   Peripheral IV   Procedures and diagnostic studies:   No results found.   Medical Consultants:   None.   Subjective:    Sherica Paternostro relates her pain is improved, was able to tolerate a clear liquid diet.  Objective:    Vitals:   07/23/22 0459 07/23/22 0750 07/23/22 2010 07/24/22 0455  BP: 113/77 112/77 111/75 103/64  Pulse: 95 88 76 82  Resp: '16 18 14 16  '$ Temp: 98.9 F (37.2 C) 98.5 F (36.9 C) 98.1 F (36.7 C) 97.9  F (36.6 C)  TempSrc:  Oral Oral Oral  SpO2: 92% 91% 90% 95%  Weight:      Height:       SpO2: 95 % O2 Flow Rate (L/min): 2 L/min FiO2 (%): 28 %   Intake/Output Summary (Last 24 hours) at 07/24/2022 0858 Last data filed at 07/24/2022 0349 Gross per 24 hour  Intake 1051.67 ml  Output --  Net 1051.67 ml    Filed Weights   07/21/22 1005 07/22/22 0049  Weight: 68.5 kg  67.5 kg    Exam: General exam: In no acute distress. Respiratory system: Good air movement and clear to auscultation. Cardiovascular system: S1 & S2 heard, RRR. No JVD. Extremities: No pedal edema. Skin: No rashes, lesions or ulcers Psychiatry: Judgement and insight appear normal. Mood & affect appropriate. Gastrointestinal system: Soft nondistended diffuse tenderness but improved compared to yesterday.    Data Reviewed:    Labs: Basic Metabolic Panel: Recent Labs  Lab 07/21/22 1008 07/22/22 0621 07/23/22 0543 07/24/22 0503  NA 136 137 136  --   K 3.2* 2.6* 3.3* 3.3*  CL 94* 99 99  --   CO2 '30 28 31  '$ --   GLUCOSE 93 56* 96  --   BUN '11 9 8  '$ --   CREATININE 0.52 0.36* 0.38*  --   CALCIUM 9.0 8.0* 8.4*  --   MG  --   --   --  1.5*  PHOS  --   --   --  3.5    GFR Estimated Creatinine Clearance: 88.8 mL/min (A) (by C-G formula based on SCr of 0.38 mg/dL (L)). Liver Function Tests: Recent Labs  Lab 07/21/22 1008 07/22/22 0621  AST 44* 29  ALT 16 13  ALKPHOS 98 71  BILITOT 1.3* 1.2  PROT 9.3* 6.8  ALBUMIN 3.1* 2.3*    Recent Labs  Lab 07/21/22 1008 07/22/22 0621  LIPASE 650* 108*    No results for input(s): "AMMONIA" in the last 168 hours. Coagulation profile No results for input(s): "INR", "PROTIME" in the last 168 hours. COVID-19 Labs  No results for input(s): "DDIMER", "FERRITIN", "LDH", "CRP" in the last 72 hours.  Lab Results  Component Value Date   SARSCOV2NAA NEGATIVE 06/30/2022   SARSCOV2NAA POSITIVE (A) 08/23/2020    CBC: Recent Labs  Lab 07/21/22 1013 07/22/22 2006 07/23/22 0543 07/23/22 2013 07/24/22 0503  WBC 19.0* 11.5* 11.9* 10.8* 10.5  NEUTROABS 15.6*  --   --   --   --   HGB 14.1 10.4* 11.0* 11.1* 10.2*  HCT 42.6 32.5* 35.0* 34.5* 31.7*  MCV 86.8 89.5 90.0 89.6 89.8  PLT 519* 421* 444* 419* 402*    Cardiac Enzymes: No results for input(s): "CKTOTAL", "CKMB", "CKMBINDEX", "TROPONINI" in the last 168 hours. BNP (last 3  results) No results for input(s): "PROBNP" in the last 8760 hours. CBG: No results for input(s): "GLUCAP" in the last 168 hours. D-Dimer: No results for input(s): "DDIMER" in the last 72 hours. Hgb A1c: Recent Labs    07/22/22 0919  HGBA1C 5.0   Lipid Profile: No results for input(s): "CHOL", "HDL", "LDLCALC", "TRIG", "CHOLHDL", "LDLDIRECT" in the last 72 hours. Thyroid function studies: No results for input(s): "TSH", "T4TOTAL", "T3FREE", "THYROIDAB" in the last 72 hours.  Invalid input(s): "FREET3" Anemia work up: No results for input(s): "VITAMINB12", "FOLATE", "FERRITIN", "TIBC", "IRON", "RETICCTPCT" in the last 72 hours. Sepsis Labs: Recent Labs  Lab 07/21/22 1602 07/21/22 1810 07/22/22 0621 07/22/22 2006 07/23/22 0543 07/23/22 2013  07/24/22 0503  PROCALCITON <0.10  --  <0.10  --  <0.10  --   --   WBC  --   --   --  11.5* 11.9* 10.8* 10.5  LATICACIDVEN 0.8 0.6  --   --   --   --   --     Microbiology No results found for this or any previous visit (from the past 240 hour(s)).   Medications:    (feeding supplement) PROSource Plus  30 mL Oral BID BM   amLODipine  10 mg Oral q AM   feeding supplement  1 Container Oral TID BM   multivitamin with minerals  1 tablet Oral Daily   pantoprazole (PROTONIX) IV  40 mg Intravenous Q12H   Continuous Infusions:  dextrose 5 % 1,000 mL with potassium chloride 40 mEq infusion 75 mL/hr at 07/24/22 0359      LOS: 3 days   Charlynne Cousins  [facility]  07/24/2022, 8:58 AM

## 2022-07-24 NOTE — Progress Notes (Signed)
Gastroenterology Progress Note   Referring Provider: No ref. provider found Primary Care Physician:  [name], MD Primary Gastroenterologist:  [facility]  Patient ID: Jillian Shepherd; [id number]; Jan 03, 1986   Subjective:    States she has burning in the upper abdomen. Some nausea but tolerating clear liquids. She admits that she never tried to consume solid food at home, maintained a clear liquid diet.   Objective:   Vital signs in last 24 hours: Temp:  [97.9 F (36.6 C)-98.1 F (36.7 C)] 97.9 F (36.6 C) (12/19 0912) Pulse Rate:  [76-84] 84 (12/19 0912) Resp:  [14-19] 19 (12/19 0912) BP: (103-112)/(64-80) 112/80 (12/19 0912) SpO2:  [90 %-95 %] 93 % (12/19 0912) Last BM Date : 07/23/22 General:   Alert,  Well-developed, well-nourished, pleasant and cooperative in NAD Head:  Normocephalic and atraumatic. Eyes:  Sclera clear, no icterus.  Abdomen:  Soft,  nondistended. Mild epigastric tenderness. Normal bowel sounds, without guarding, and without rebound.   Extremities:  Without clubbing, deformity or edema. Neurologic:  Alert and  oriented x4;  grossly normal neurologically. Skin:  Intact without significant lesions or rashes. Psych:  Alert and cooperative. Normal mood and affect.  Intake/Output from previous day: 12/18 0701 - 12/19 0700 In: 1051.7 [P.O.:50; I.V.:1001.7] Out: -  Intake/Output this shift: No intake/output data recorded.  Lab Results: CBC Recent Labs    07/23/22 0543 07/23/22 2013 07/24/22 0503  WBC 11.9* 10.8* 10.5  HGB 11.0* 11.1* 10.2*  HCT 35.0* 34.5* 31.7*  MCV 90.0 89.6 89.8  PLT 444* 419* 402*   BMET Recent Labs    07/22/22 0621 07/23/22 0543 07/24/22 0503  NA 137 136  --   K 2.6* 3.3* 3.3*  CL 99 99  --   CO2 28 31  --   GLUCOSE 56* 96  --   BUN 9 8  --   CREATININE 0.36* 0.38*  --   CALCIUM 8.0* 8.4*  --    LFTs Recent Labs    07/22/22 0621  BILITOT 1.2  ALKPHOS 71  AST 29  ALT 13  PROT 6.8  ALBUMIN 2.3*    Recent Labs    07/22/22 0621  LIPASE 108*   PT/INR No results for input(s): "LABPROT", "INR" in the last 72 hours.       Imaging Studies: CT ABDOMEN PELVIS W CONTRAST  Result Date: 07/21/2022 CLINICAL DATA:  Follow-up acute pancreatitis and splenic hematoma. Worsening abdominal pain for several days. Nausea and vomiting. EXAM: CT ABDOMEN AND PELVIS WITH CONTRAST TECHNIQUE: Multidetector CT imaging of the abdomen and pelvis was performed using the standard protocol following bolus administration of intravenous contrast. RADIATION DOSE REDUCTION: This exam was performed according to the departmental dose-optimization program which includes automated exposure control, adjustment of the mA and/or kV according to patient size and/or use of iterative reconstruction technique. CONTRAST:  135m OMNIPAQUE IOHEXOL 300 MG/ML  SOLN COMPARISON:  07/08/2022 FINDINGS: Lower Chest: Small left pleural effusion and left lower lobe atelectasis have decreased since prior study. Hepatobiliary: A 7 mm hypervascular lesion is seen in the posterior liver dome. This remains stable in retrospect compared to previous study, likely representing a flash-filling hemangioma. Gallbladder is unremarkable. No evidence of biliary ductal dilatation. Pancreas: Peripancreatic edema is again seen surrounding the pancreatic body and tail, consistent with acute pancreatitis. A 3.5 x 1.9 cm fluid collection is seen adjacent to the pancreatic tail, mildly increased from 2.2 x 1.9 cm previously. This is consistent with a pancreatic pseudocyst. No evidence  of pancreatic ductal dilatation. Spleen: A large subcapsular hematoma is again seen along the lateral aspect of the spleen, which is slightly decreased in size. This currently measures 13.7 x 9.7 cm, compared to 14.3 x 10.5 cm previously. Adrenals/Urinary Tract: No suspicious masses identified. No evidence of ureteral calculi or hydronephrosis. Stomach/Bowel: No evidence of obstruction,  inflammatory process or abnormal fluid collections. Normal appendix visualized. Vascular/Lymphatic: 12 mm lymph node in the porta hepatis remains stable, likely reactive in etiology. Thrombosis of the proximal splenic vein is again seen with venous collaterals in the left upper quadrant. Nonocclusive thrombus again seen throughout the portal veins, which is increased in the left portal vein since previous study. Reproductive:  No mass or other significant abnormality. Other:  None. Musculoskeletal:  No suspicious bone lesions identified. IMPRESSION: Moderate acute pancreatitis, without significant change. Mild increase in size of 3.5 cm pancreatic pseudocyst adjacent to the pancreatic tail. Mild decrease in size of large subcapsular splenic hematoma. 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 Nonocclusive thrombus throughout the portal veins, which is increased in the left portal vein since previous study. Decreased small left pleural effusion and left lower lobe atelectasis. Stable 7 mm hypervascular lesion in posterior liver dome, likely representing a flash-filling hemangioma. Recommend continued attention on follow-up imaging. Electronically Signed   By: Marlaine Hind M.D.   On: 07/21/2022 14:28   US Abdomen Complete  Result Date: 07/21/2022 CLINICAL DATA:  Abdominal pain. Known acute pancreatitis, splenic hematoma, and pancreatic pseudocyst. Technologist indicates limited study due to patient pain tolerance, bowel gas, and limited mobility. EXAM: ABDOMEN ULTRASOUND COMPLETE COMPARISON:  CT abdomen and pelvis July 08, 2022 FINDINGS: Gallbladder: Layering echogenic material within the gallbladder, likely sludge. No gallstones or wall thickening visualized. Positive sonographic Murphy sign noted by the sonographer, however limited assessment in the setting acute pancreatitis. Common bile duct: Diameter: 7 mm Liver: Increased and heterogeneous hepatic  parenchymal echogenicity. There is also a more focal masslike echogenic area in the left hepatic lobe that measures 5.4 x 5.3 x 5.1 cm. Near-occlusive thrombus in the main portal vein. IVC: No abnormality visualized. Pancreas: Limited visualization secondary to overlying bowel gas. The known pancreatic pseudocyst is not well seen. Spleen: Heterogeneous perisplenic fluid collection that measures approximately 11.3 x 6.1 x 7.0 cm. Right Kidney: Length: 12.2 cm. Echogenicity within normal limits. No mass or hydronephrosis visualized. Left Kidney: Length: 11.9 cm. Echogenicity within normal limits. No mass or hydronephrosis visualized. Abdominal aorta: No aneurysm visualized. Other findings: None. IMPRESSION: 1. Large perisplenic hematoma, better assessed on recent CT. 2. Near-occlusive thrombus in the main portal vein. 3. There is a 5.4 cm focal masslike echogenic area in the left hepatic lobe, incompletely assessed on ultrasound. Differential considerations include differential hepatic perfusion in the setting of portal vein thrombus, hematoma, or focal mass. Recommend further assessment with liver MRI with and without contrast. 4. Gallbladder sludge without sonographic evidence of acute cholecystitis. The mildly dilated common bile duct and positive sonographic Murphy sign noted by the sonographer is favored to be related to known acute pancreatitis. Electronically Signed   By: Beryle Flock M.D.   On: 07/21/2022 12:32   CT ABDOMEN PELVIS W CONTRAST  Result Date: 07/08/2022 CLINICAL DATA:  Follow-up acute pancreatitis and splenic hematoma. EXAM: CT ABDOMEN AND PELVIS WITH CONTRAST TECHNIQUE: Multidetector CT imaging of the abdomen and pelvis was performed using the standard protocol following bolus administration of intravenous contrast. RADIATION DOSE REDUCTION: This exam was  performed according to the departmental dose-optimization program which includes automated exposure control, adjustment of the mA  and/or kV according to patient size and/or use of iterative reconstruction technique. CONTRAST:  179m OMNIPAQUE IOHEXOL 300 MG/ML  SOLN COMPARISON:  06/30/2022 FINDINGS: Lower Chest: Moderate left pleural effusion and left lower lobe atelectasis show mild increase since previous study. Hepatobiliary: No hepatic masses identified. Nonocclusive thrombus is seen in the main and right portal veins. Gallbladder is unremarkable. No evidence of biliary ductal dilatation. Pancreas: Moderate acute pancreatitis shows no significant change. A 2.2 x 1.9 cm fluid collection in the splenic tail is also unchanged and consistent with a small pseudocyst. Spleen: Large subcapsular splenic hematoma measures 14.3 x 10.5 cm, without significant change in size compared to prior study. Splenic vein thrombosis also noted with venous collateral seen in the gastrosplenic ligament. Adrenals/Urinary Tract: No suspicious masses identified. No evidence of ureteral calculi or hydronephrosis. Stomach/Bowel: Nasogastric tube tip is seen in the distal gastric body. No evidence of obstruction, inflammatory process or abnormal fluid collections. Vascular/Lymphatic: No pathologically enlarged lymph nodes. Nonocclusive thrombus in the main and right portal veins and splenic vein thrombosis again noted, as described above. Reproductive:  No mass or other significant abnormality. Other:  None. Musculoskeletal:  No suspicious bone lesions identified. IMPRESSION: No significant change in moderate acute pancreatitis. Stable 2.2 cm pseudocyst in splenic tail. No significant change in large subcapsular splenic hematoma. Nonocclusive thrombus in the main and right portal veins. Splenic vein thrombosis. Increased moderate left pleural effusion and left lower lobe atelectasis. Electronically Signed   By: JMarlaine HindM.D.   On: 07/08/2022 10:02   DG Chest Port 1 View  Result Date: 07/07/2022 CLINICAL DATA:  Assess for nasogastric tube placement EXAM:  PORTABLE CHEST 1 VIEW COMPARISON:  June 30, 2022 FINDINGS: The heart size and mediastinal contours are stable. Patchy consolidation of left lung base with left pleural effusion noted. Mild patchy consolidation of medial right lung base is noted. Nasogastric tube is identified with distal tip in the distal stomach. The visualized skeletal structures are stable. IMPRESSION: Nasogastric tube with distal tip in the distal stomach. Patchy consolidation of left lung base with left pleural effusion noted. Mild patchy consolidation of medial right lung base is noted. Electronically Signed   By: WAbelardo DieselM.D.   On: 07/07/2022 09:19   CT ABDOMEN PELVIS W CONTRAST  Result Date: 06/30/2022 CLINICAL DATA:  Abdominal pain, acute, nonlocalized diffuse pain, recent pancreatitis EXAM: CT ABDOMEN AND PELVIS WITH CONTRAST TECHNIQUE: Multidetector CT imaging of the abdomen and pelvis was performed using the standard protocol following bolus administration of intravenous contrast. RADIATION DOSE REDUCTION: This exam was performed according to the departmental dose-optimization program which includes automated exposure control, adjustment of the mA and/or kV according to patient size and/or use of iterative reconstruction technique. CONTRAST:  1039mOMNIPAQUE IOHEXOL 300 MG/ML  SOLN COMPARISON:  None Available. FINDINGS: Lower chest: Trace bilateral, left greater than right, pleural effusion. Associated passive atelectasis of left lower lobe. Hepatobiliary: The liver is enlarged measuring up to 19 cm. No focal liver abnormality. No gallstones or definite gallbladder wall thickening. Positive for pericholecystic fluid. No biliary dilatation. Pancreas: No focal lesion. Hazy pancreatic contour with associated peripancreatic fat stranding and free fluid. Slightly decreased in size cystic lesion measuring 2 x 2 cm along the distal pancreatic body. Poorly visualized distal pancreatic tail likely due to cystic changes and edema  with underlying nonenhancement of the pancreatic tail not fully excluded. Interval resolution  of cystic lesion along the greater curvature of the stomach. No main pancreatic ductal dilatation. Spleen: Similar-appearing heterogeneous 14 x 8 cm perisplenic subcapsular lesion likely representing a hematoma. Adrenals/Urinary Tract: No focal lesion. Normal pancreatic contour. No surrounding inflammatory changes. No main pancreatic ductal dilatation. Stomach/Bowel: Stomach is within normal limits. No evidence of bowel wall thickening or dilatation. Appendix appears normal. Vascular/Lymphatic: No splenic artery aneurysm identified. Venous collaterals noted. No abdominal aorta or iliac aneurysm. Mild atherosclerotic plaque of the aorta and its branches. No abdominal, pelvic, or inguinal lymphadenopathy. Reproductive: Uterus and bilateral adnexa are unremarkable. Other: Small volume simple free fluid ascites. No intraperitoneal free gas. No organized fluid collection. Musculoskeletal: No abdominal wall hernia or abnormality. Soft tissue density along the anterior abdominal wall likely due to medication injection. No suspicious lytic or blastic osseous lesions. No acute displaced fracture. Multilevel degenerative changes of the spine. IMPRESSION: 1. Acute pancreatitis with interval decrease in size of a distal pancreatic body cystic lesion likely representing a pseudocyst. Interval resolution of cystic lesion along the greater curvature of the stomach. Component of necrotizing pancreatitis along the pancreatic tail is not fully excluded. 2. Similar-appearing heterogeneous 14 x 8 cm perisplenic subcapsular hematoma. 3. Nonspecific pericholecystic fluid with no definite CT findings acute cholecystitis. 4. Small volume simple free fluid ascites. 5. Trace bilateral, left greater than right, pleural effusion. 6. Mild hepatomegaly. 7. Venous collaterals. Electronically Signed   By: Iven Finn M.D.   On: 06/30/2022 22:45   DG  Chest Port 1 View  Result Date: 06/30/2022 CLINICAL DATA:  Recent effusion with decreased lung sounds on the left EXAM: PORTABLE CHEST 1 VIEW COMPARISON:  Radiographs 06/13/2022 FINDINGS: Slight decrease in small left pleural effusion and associated atelectasis since 06/13/2022. Possible trace right pleural effusion. No pneumothorax. Stable cardiomediastinal silhouette. No acute osseous abnormality. IMPRESSION: Since 06/13/2022, decreased small left pleural effusion and associated atelectasis. Pneumonia is difficult to exclude. Electronically Signed   By: Placido Sou M.D.   On: 06/30/2022 18:26  [2 weeks]  Assessment:   37 year old female with history of alcohol abuse complicated by recurrent alcoholic pancreatitis, hypertension, Liddle syndrome, splenic hematoma and splenic vein thrombosis, presenting this admission with worsening abdominal pain due to recurrent pancreatitis.  Well-known to GI with recent hospitalization for recurrent pancreatitis.  Recurrent pancreatitis: Denies alcohol intake.  Pancreatic tail pseudocyst has increased in size.  Worsening nonocclusive portal vein thrombosis could be contributing to abdominal pain.  Due to risk of bleeding in light of splenic hematoma, holding off on anticoagulation. Dr. Jenetta Downer consulted with IR, see separate note, dated 07/22/22. If she were to have worsening abdominal pain, would need CT to assess if thrombosis has extended to mesenteric vasculature. At this time her pain is stable.     Plan:   Continue clear liquids.  Continue PPI BID.  Continue supportive measures.    LOS: 3 days   Laureen Ochs. Bernarda Caffey [facility] [phone] 12/19/202311:41 AM

## 2022-07-25 DIAGNOSIS — K859 Acute pancreatitis without necrosis or infection, unspecified: Secondary | ICD-10-CM | POA: Diagnosis not present

## 2022-07-25 DIAGNOSIS — K863 Pseudocyst of pancreas: Secondary | ICD-10-CM | POA: Diagnosis not present

## 2022-07-25 DIAGNOSIS — I81 Portal vein thrombosis: Secondary | ICD-10-CM | POA: Diagnosis not present

## 2022-07-25 DIAGNOSIS — K861 Other chronic pancreatitis: Secondary | ICD-10-CM | POA: Diagnosis not present

## 2022-07-25 LAB — BASIC METABOLIC PANEL
Anion gap: 5 (ref 5–15)
BUN: <5 mg/dL — ABNORMAL LOW (ref 6–20)
CO2: 29 mmol/L (ref 22–32)
Calcium: 8.4 mg/dL — ABNORMAL LOW (ref 8.9–10.3)
Chloride: 102 mmol/L (ref 98–111)
Creatinine, Ser: <0.3 mg/dL — ABNORMAL LOW (ref 0.44–1.00)
Glucose, Bld: 93 mg/dL (ref 70–99)
Potassium: 3.8 mmol/L (ref 3.5–5.1)
Sodium: 136 mmol/L (ref 135–145)

## 2022-07-25 LAB — MAGNESIUM
Magnesium: 1.5 mg/dL — ABNORMAL LOW (ref 1.7–2.4)
Magnesium: 1.9 mg/dL (ref 1.7–2.4)

## 2022-07-25 LAB — GLUCOSE, CAPILLARY: Glucose-Capillary: 110 mg/dL — ABNORMAL HIGH (ref 70–99)

## 2022-07-25 LAB — POTASSIUM: Potassium: 4 mmol/L (ref 3.5–5.1)

## 2022-07-25 LAB — PHOSPHORUS
Phosphorus: 3.4 mg/dL (ref 2.5–4.6)
Phosphorus: 2.7 mg/dL (ref 2.5–4.6)

## 2022-07-25 MED ORDER — MAGNESIUM SULFATE 2 GM/50ML IV SOLN
2.0000 g | Freq: Once | INTRAVENOUS | Status: AC
  Administered 2022-07-25: 2 g via INTRAVENOUS
  Filled 2022-07-25: qty 50

## 2022-07-25 MED ORDER — SODIUM CHLORIDE 0.9% FLUSH: 10.0000 mL | INTRAVENOUS | Status: DC | PRN

## 2022-07-25 MED ORDER — OXYCODONE HCL 5 MG PO TABS
10.0000 mg | ORAL_TABLET | ORAL | Status: DC | PRN
  Administered 2022-07-25 – 2022-07-27 (×11): 10 mg via ORAL
  Filled 2022-07-25 (×11): qty 2

## 2022-07-25 MED ORDER — SODIUM CHLORIDE 0.9% FLUSH
10.0000 mL | Freq: Two times a day (BID) | INTRAVENOUS | Status: DC
  Administered 2022-07-25 – 2022-07-27 (×5): 10 mL

## 2022-07-25 MED ORDER — INSULIN ASPART 100 UNIT/ML IJ SOLN
0.0000 [IU] | Freq: Four times a day (QID) | INTRAMUSCULAR | Status: DC
  Administered 2022-07-26 – 2022-07-27 (×3): 1 [IU] via SUBCUTANEOUS

## 2022-07-25 MED ORDER — TRAVASOL 10 % IV SOLN
INTRAVENOUS | Status: AC
  Filled 2022-07-25: qty 556.8

## 2022-07-25 MED ORDER — HYDROMORPHONE HCL 1 MG/ML IJ SOLN
1.5000 mg | INTRAMUSCULAR | Status: DC | PRN
  Administered 2022-07-25 – 2022-07-26 (×5): 1.5 mg via INTRAVENOUS
  Filled 2022-07-25 (×5): qty 1.5

## 2022-07-25 MED ORDER — KCL IN DEXTROSE-NACL 40-5-0.45 MEQ/L-%-% IV SOLN: INTRAVENOUS | Status: AC

## 2022-07-25 MED ORDER — CHLORHEXIDINE GLUCONATE CLOTH 2 % EX PADS
6.0000 | MEDICATED_PAD | Freq: Every day | CUTANEOUS | Status: DC
  Administered 2022-07-25 – 2022-07-27 (×3): 6 via TOPICAL

## 2022-07-25 NOTE — TOC Initial Note (Signed)
Transition of Care [facility]) - Initial/Assessment Note    Patient Details  Name: Jillian Shepherd MRN: [id number] Date of Birth: 1986/01/25  Transition of Care [facility]) CM/SW Contact:    Ihor Gully, LCSW Phone Number: 07/25/2022, 2:47 PM  Clinical Narrative:                 Patient from home with spouse and children. Admitted for pancreatitis. Independent at baseline, drives. TOC consulted due to patient being in need of home TPN. Contacted Pam with [employer] regarding need for home TPN. Pam to assist with [facility] RN also.  Planned Disposition: Home with Health Care Svc Barriers to Discharge: Continued Medical Work up   Patient Goals and CMS Choice Patient states their goals for this hospitalization and ongoing recovery are:: return home          Expected Discharge Plan and Services Planned Disposition: Home with Health Care Svc       Living arrangements for the past 2 months: [place]                                      Prior Living Arrangements/Services Living arrangements for the past 2 months: Single Family Home Lives with:: Spouse, Minor Children Patient language and need for interpreter reviewed:: Yes                 Activities of Daily Living Home Assistive Devices/Equipment: None ADL Screening (condition at time of admission) Patient's cognitive ability adequate to safely complete daily activities?: Yes Is the patient deaf or have difficulty hearing?: No Does the patient have difficulty seeing, even when wearing glasses/contacts?: No Does the patient have difficulty concentrating, remembering, or making decisions?: No Patient able to express need for assistance with ADLs?: Yes Does the patient have difficulty dressing or bathing?: No Independently performs ADLs?: Yes (appropriate for developmental age) Communication: Independent Dressing (OT): Independent Grooming: Independent Feeding: Independent Bathing: Independent Toileting:  Independent In/Out Bed: Independent Walks in Home: Independent Does the patient have difficulty walking or climbing stairs?: No Weakness of Legs: None Weakness of Arms/Hands: None  Permission Sought/Granted                  Emotional Assessment     Affect (typically observed): Appropriate Orientation: : Oriented to Self, Oriented to Place, Oriented to  Time, Oriented to Situation Alcohol / Substance Use: Not Applicable Psych Involvement: No (comment)  Admission diagnosis:  Acute on chronic pancreatitis ([place]) [K85.90, K86.1] Alcohol-induced acute pancreatitis, unspecified complication status [A25.05] Patient Active Problem List   Diagnosis Date Noted   Portal vein thrombosis 07/24/2022   Leukocytosis 07/21/2022   Chronic pancreatitis ([place]) 07/18/2022   Dehydration 07/07/2022   Diabetes mellitus type II, controlled ([place]) 07/01/2022   Acute on chronic pancreatitis 06/30/2022   Malnutrition of moderate degree 06/12/2022   Pancreatic pseudocyst 06/11/2022   Acute hypoxic respiratory failure ([place]) 06/10/2022   Pleural effusion on left 06/10/2022   Spleen hematoma 06/08/2022   Acute pancreatitis 05/31/2022   Splenic vein thrombosis 05/31/2022   Liddle's syndrome 05/31/2022   Hypoxia 05/11/2022   Abdominal pain 05/10/2022   Nausea & vomiting 05/10/2022   Hypokalemia 05/10/2022   Hypomagnesemia 05/10/2022   Prolonged QT interval 05/10/2022   Hyperglycemia 05/10/2022   Alcohol abuse 39/76/7341   Acute alcoholic pancreatitis 93/79/0240   Diarrhea    Pancreatitis, acute 07/27/2020   Sacral back pain 09/14/2019  Bilateral ovarian cysts 09/02/2018   Elevated blood pressure reading without diagnosis of hypertension 01/20/2015   Essential hypertension 07/07/2014   Depression 07/07/2014   Obesity 07/07/2014   PCP:  [name], MD Pharmacy:   [id number] [place] [id number]:  [phone]: [phone]    Social Determinants of Health (SDOH) Social History: [place] No Food Insecurity (07/22/2022)  Housing: Low Risk  (07/22/2022)  Transportation Needs: No Transportation Needs (07/22/2022)  Utilities: Not At Risk (07/22/2022)  Depression (PHQ2-9): Low Risk  (05/17/2022)  Tobacco Use: High Risk (07/21/2022)   SDOH Interventions:     Readmission Risk Interventions     No data to display

## 2022-07-25 NOTE — Hospital Course (Signed)
Jillian Shepherd is a 36 yo female with PMH Liddle syndrome, obesity, alcoholic pancreatitis/chronic pancreatitis with pseudocyst formation. She has been admitted multiple times for recurrent acute on chronic pancreatitis.  She states she has not had alcohol in approximately 4 months prior to admission but has continued to have ongoing abdominal pain and unable to tolerate solid foods recently. She has a known subcapsular perisplenic hematoma and on prior hospitalization she was also found to have a nonocclusive thrombus involving the main and right portal veins and splenic vein thrombosis on CT on 07/08/22.  She is not considered anticoagulation candidate per GI due to perisplenic hematoma.  Due to persistent abdominal pain and inability to tolerate oral nutrition, she was recommended for initiating on TPN.  PICC line was placed on 07/25/2022.

## 2022-07-25 NOTE — Progress Notes (Signed)
Subjective: Patient notes ongoing abdominal burning. Last BM was yesterday, small in volume with harder stool balls. Some nausea this morning. No vomiting. Started on TPN last night. Continues on clear liquids, felt that nausea was worse this morning after, states pain did not worsen with them but she feels "could not get any worse." Continues to require around the clock pain management.   Objective: Vital signs in last 24 hours: Temp:  [97.9 F (36.6 C)-98.2 F (36.8 C)] 98 F (36.7 C) (12/20 0835) Pulse Rate:  [79-88] 84 (12/20 0835) Resp:  [18-20] 18 (12/20 0835) BP: (107-118)/(77-83) 114/77 (12/20 0835) SpO2:  [90 %-95 %] 94 % (12/20 0835) Last BM Date : 07/23/22 General:   Alert and oriented, pleasant Head:  Normocephalic and atraumatic. Eyes:  No icterus, sclera clear. Conjuctiva pink.  Mouth:  Without lesions, mucosa pink and moist.  Heart:  S1, S2 present, no murmurs noted.  Lungs: Clear to auscultation bilaterally, without wheezing, rales, or rhonchi.  Abdomen:  Bowel sounds present, soft, non-distended. TTP of epigastric and RUQ regions. No HSM or hernias noted. No rebound or guarding. No masses appreciated  Msk:  Symmetrical without gross deformities. Normal posture. Pulses:  Normal pulses noted. Extremities:  Without clubbing or edema. Neurologic:  Alert and  oriented x4;  grossly normal neurologically. Skin:  Warm and dry, intact without significant lesions.  Psych:  Alert and cooperative. Normal mood and affect.  Intake/Output from previous day: 12/19 0701 - 12/20 0700 In: 1461.8 [P.O.:240; I.V.:725.8; IV Piggyback:496] Out: -   Lab Results: Recent Labs    07/23/22 2013 07/24/22 0503 07/24/22 1744  WBC 10.8* 10.5 10.1  HGB 11.1* 10.2* 10.6*  HCT 34.5* 31.7* 33.6*  PLT 419* 402* 441*   BMET Recent Labs    07/23/22 0543 07/24/22 0503 07/24/22 1744 07/25/22 0444  NA 136  --   --  136  K 3.3* 3.3* 3.7 3.8  CL 99  --   --  102  CO2 31  --   --  29   GLUCOSE 96  --   --  93  BUN 8  --   --  <5*  CREATININE 0.38*  --   --  <0.30*  CALCIUM 8.4*  --   --  8.4*   Assessment: Jillian Shepherd is a 36 year old female with history of alcohol abuse complicated by recurrent alcoholic pancreatitis, hypertension, Liddle syndrome, splenic hematoma and splenic vein thrombosis, presenting this admission with worsening abdominal pain due to recurrent pancreatitis.  Well-known to GI with recent hospitalization for recurrent pancreatitis.   Recurrent pancreatitis: Denies recent alcohol intake.  Pancreatic tail pseudocyst has increased in size.  Worsening nonocclusive portal vein thrombosis could be contributing to abdominal pain.  Due to risk of bleeding in light of splenic hematoma, holding off on anticoagulation. Dr. Jenetta Downer consulted with IR, see separate note, dated 07/22/22. If she were to have worsening abdominal pain, would need CT to assess if thrombosis has extended to mesenteric vasculature. At this time her pain is stable.  She continues to have pain with clear liquids, some worsening nausea this morning after liquid breakfast.  Dr. Jenetta Downer had thorough discussion with the patient yesterday regarding the potential options for management of her recurrent episodes of pancreatitis.  given the severity of her inflammation, she may only be a candidate for NJ tube placement to bypass the pancreatic stimulation versus TPN. The patient  refused NJ tube placement and wanted to try TPN. This was discussed with hospitalist, PICC  line placed this morning, TPN to start this evening. She continues to require around the clock pain management   Plan: Continue clear liquids Continue PPI BID Continue supportive measures  TPN per hospitalist  Pain and nausea management per hospitalist  Avoid all ETOH   LOS: 4 days    07/25/2022, 9:09 AM   Jillian Nachreiner L. Alver Sorrow, MSN, APRN, AGNP-C Adult-Gerontology Nurse Practitioner [facility]

## 2022-07-25 NOTE — Progress Notes (Signed)
Progress Note    Jillian Shepherd   CWC:[id number]  DOB: 1985-08-07  DOA: 07/21/2022     4 PCP: [name], MD  Initial CC: abdominal pain, N/V  Hospital Course: Jillian Shepherd is a 36 yo female with PMH Liddle syndrome, obesity, alcoholic pancreatitis/chronic pancreatitis with pseudocyst formation. She has been admitted multiple times for recurrent acute on chronic pancreatitis.  She states she has not had alcohol in approximately 4 months prior to admission but has continued to have ongoing abdominal pain and unable to tolerate solid foods recently. She has a known subcapsular perisplenic hematoma and on prior hospitalization she was also found to have a nonocclusive thrombus involving the main and right portal veins and splenic vein thrombosis on CT on 07/08/22.  She is not considered anticoagulation candidate per GI due to perisplenic hematoma.  Due to persistent abdominal pain and inability to tolerate oral nutrition, she was recommended for initiating on TPN.  PICC line was placed on 07/25/2022.  Interval History:  No events overnight.  Patient still dependent on Dilaudid IV stating it does not last the full 2 hours between doses.  She appears very uncomfortable when seen this morning and asking for further pain control somehow.  Assessment and Plan:  Acute on chronic pancreatitis Pseudocyst formation -Persistent moderate pancreatitis without change noted on CT A/P on 07/21/2022.  Prior CT was 07/08/2022.  No evidence of necrosis noted -Also has mild increase of 3.5 cm pancreatic pseudocyst adjacent to pancreatic tail -GI following, appreciate assistance.  Patient has been unable to tolerate adequate oral nutrition and was recommended for initiation of TPN - PICC placed on 07/25/2022 and TPN to be started with plans for probable home TPN I assume - continue pain control; adding PO oxy for longer relief esp as patient too dependent on IV dilaudid    Nonocclusive thrombus of the  portal vein/splenic vein thrombosis Large subcapsular splenic hematoma -Noted on CTs on 12/3 and 12/16; most recent CT states mild decrease in size of hematoma (now 13.7 x 9.7 cm from prior of 14.3 x 10.5 cm) - no anticoagulation as per GI due to hematoma    Hypokalemia -Replete as needed   Leukocytosis -resolved -Low suspicion for infection.  Considered reactive - WBC has slowly down trended   Essential hypertension -Continue amlodipine   Liddle's syndrome -Monitoring BP and electrolytes   Old records reviewed in assessment of this patient  Antimicrobials:   DVT prophylaxis:     Code Status:   Code Status: Full Code  Mobility Assessment (last 72 hours)     Mobility Assessment     Row Name 07/24/22 0912 07/23/22 0750         Does patient have an order for bedrest or is patient medically unstable No - Continue assessment No - Continue assessment      What is the highest level of mobility based on the progressive mobility assessment? Level 5 (Walks with assist in room/hall) - Balance while stepping forward/back and can walk in room with assist - Complete Level 5 (Walks with assist in room/hall) - Balance while stepping forward/back and can walk in room with assist - Complete      Is the above level different from baseline mobility prior to current illness? Yes - Recommend PT order Yes - Recommend PT order               Barriers to discharge:  Disposition Plan: Home 2 to 3 days Status is: Inpatient  Objective:  Blood pressure 119/80, pulse 87, temperature 98 F (36.7 C), temperature source Oral, resp. rate 18, height '5\' 3"'$  (1.6 m), weight 69.9 kg, last menstrual period 07/01/2022, SpO2 93 %.  Examination:  Physical Exam Constitutional:      General: She is not in acute distress.    Appearance: She is well-developed.  HENT:     Head: Normocephalic and atraumatic.     Mouth/Throat:     Mouth: Mucous membranes are moist.  Eyes:     Extraocular Movements:  Extraocular movements intact.  Cardiovascular:     Rate and Rhythm: Normal rate and regular rhythm.  Pulmonary:     Effort: Pulmonary effort is normal.     Breath sounds: Normal breath sounds.  Abdominal:     General: There is no distension.     Palpations: Abdomen is soft.     Comments: Generalized tenderness to palpation, no rebound or guarding  Musculoskeletal:        General: Normal range of motion.     Cervical back: Normal range of motion and neck supple.  Skin:    General: Skin is warm and dry.  Neurological:     General: No focal deficit present.     Mental Status: She is alert.  Psychiatric:        Mood and Affect: Mood normal.      Consultants:  GI  Procedures:  07/25/2022: PICC placement  Data Reviewed: Results for orders placed or performed during the hospital encounter of 07/21/22 (from the past 24 hour(s))  CBC     Status: Abnormal   Collection Time: 07/24/22  5:44 PM  Result Value Ref Range   WBC 10.1 4.0 - 10.5 K/uL   RBC 3.78 (L) 3.87 - 5.11 MIL/uL   Hemoglobin 10.6 (L) 12.0 - 15.0 g/dL   HCT 33.6 (L) 36.0 - 46.0 %   MCV 88.9 80.0 - 100.0 fL   MCH 28.0 26.0 - 34.0 pg   MCHC 31.5 30.0 - 36.0 g/dL   RDW 17.3 (H) 11.5 - 15.5 %   Platelets 441 (H) 150 - 400 K/uL   nRBC 0.0 0.0 - 0.2 %  Magnesium     Status: Abnormal   Collection Time: 07/24/22  5:44 PM  Result Value Ref Range   Magnesium 1.5 (L) 1.7 - 2.4 mg/dL  Phosphorus     Status: None   Collection Time: 07/24/22  5:44 PM  Result Value Ref Range   Phosphorus 2.9 2.5 - 4.6 mg/dL  Potassium     Status: None   Collection Time: 07/24/22  5:44 PM  Result Value Ref Range   Potassium 3.7 3.5 - 5.1 mmol/L  Magnesium     Status: Abnormal   Collection Time: 07/25/22  4:44 AM  Result Value Ref Range   Magnesium 1.5 (L) 1.7 - 2.4 mg/dL  Phosphorus     Status: None   Collection Time: 07/25/22  4:44 AM  Result Value Ref Range   Phosphorus 3.4 2.5 - 4.6 mg/dL  Basic metabolic panel     Status:  Abnormal   Collection Time: 07/25/22  4:44 AM  Result Value Ref Range   Sodium 136 135 - 145 mmol/L   Potassium 3.8 3.5 - 5.1 mmol/L   Chloride 102 98 - 111 mmol/L   CO2 29 22 - 32 mmol/L   Glucose, Bld 93 70 - 99 mg/dL   BUN <5 (L) 6 - 20 mg/dL   Creatinine, Ser <0.30 (L) 0.44 - 1.00  mg/dL   Calcium 8.4 (L) 8.9 - 10.3 mg/dL   GFR, Estimated NOT CALCULATED >60 mL/min   Anion gap 5 5 - 15    I have Reviewed nursing notes, Vitals, and Lab results since pt's last encounter. Pertinent lab results : see above I have ordered labwork to follow up on.  I have reviewed the last note from staff over past 24 hours I have discussed pt's care plan and test results with nursing staff, CM/SW, and other staff as appropriate  Time spent: Greater than 50% of the 55 minute visit was spent in counseling/coordination of care for the patient as laid out in the A&P.   LOS: 4 days   Dwyane Dee, MD [facility] 07/25/2022, 3:39 PM

## 2022-07-25 NOTE — Progress Notes (Signed)
Peripherally Inserted Central Catheter Placement  The IV Nurse has discussed with the patient and/or persons authorized to consent for the patient, the purpose of this procedure and the potential benefits and risks involved with this procedure.  The benefits include less needle sticks, lab draws from the catheter, and the patient may be discharged home with the catheter. Risks include, but not limited to, infection, bleeding, blood clot (thrombus formation), and puncture of an artery; nerve damage and irregular heartbeat and possibility to perform a PICC exchange if needed/ordered by physician.  Alternatives to this procedure were also discussed.  Bard Power PICC patient education guide, fact sheet on infection prevention and patient information card has been provided to patient /or left at bedside.    PICC Placement Documentation  PICC Double Lumen 07/25/22 Right Brachial 37 cm 1 cm (Active)  Indication for Insertion or Continuance of Line Administration of hyperosmolar/irritating solutions (i.e. TPN, Vancomycin, etc.) 07/25/22 0700  Exposed Catheter (cm) 1 cm 07/25/22 0700  Site Assessment Clean, Dry, Intact 07/25/22 0700  Lumen #1 Status Flushed;Saline locked;Blood return noted 07/25/22 0700  Lumen #2 Status Flushed;Saline locked;Blood return noted 07/25/22 0700  Dressing Type Transparent;Securing device 07/25/22 0700  Dressing Status Antimicrobial disc in place;Clean, Dry, Intact 07/25/22 0700  Safety Lock Not Applicable 50/72/25 7505  Line Care Connections checked and tightened 07/25/22 0700  Dressing Intervention New dressing 07/25/22 0700  Dressing Change Due 08/01/22 07/25/22 0700       Holley Bouche Renee 07/25/2022, 7:59 AM

## 2022-07-25 NOTE — Progress Notes (Signed)
Nutrition Follow-up  DOCUMENTATION CODES:   Non-severe (moderate) malnutrition in context of chronic illness  INTERVENTION:  TPN per pharmacy  MVI with TPN   NUTRITION DIAGNOSIS:   Moderate Malnutrition related to chronic illness (chronic pancreatitis) as evidenced by percent weight loss, mild fat depletion, mild muscle depletion, moderate muscle depletion.  -ongoing  GOAL:   Patient will meet greater than or equal to 90% of their needs  -progressing with initiation of nutrition support   MONITOR:   PO intake, Supplement acceptance, Diet advancement, Labs, Weight trends  REASON FOR ASSESSMENT:   Consult New TPN/TNA  ASSESSMENT:   Pt admitted with abdominal pain r/t to acute on chronic pancreatitis. Pt with multiple prior recent admissions for pancreatitis. PMH significant for Liddle syndrome, alcoholic/chronic pancreatitis with pseudocysts.Patient discharged on 12/8.   Patient  receiving clear liquids. PO:0%. TPN initiation in  process. Full nutrition assessment 12/18.   CBG (last 3)  No results for input(s): "GLUCAP" in the last 72 hours.     Latest Ref Rng & Units 07/25/2022    4:44 AM 07/24/2022    5:44 PM 07/24/2022    5:03 AM  BMP  Glucose 70 - 99 mg/dL 93     BUN 6 - 20 mg/dL <5     Creatinine 0.44 - 1.00 mg/dL <0.30     Sodium 135 - 145 mmol/L 136     Potassium 3.5 - 5.1 mmol/L 3.8  3.7  3.3   Chloride 98 - 111 mmol/L 102     CO2 22 - 32 mmol/L 29     Calcium 8.9 - 10.3 mg/dL 8.4        Diet Order:   Diet Order             Diet clear liquid Room service appropriate? Yes; Fluid consistency: Thin  Diet effective now                   EDUCATION NEEDS:   Education needs have been addressed  Skin:  Skin Assessment: Reviewed RN Assessment  Last BM:  12/18 (type 5 small)  Height:   Ht Readings from Last 1 Encounters:  07/22/22 '5\' 3"'$  (1.6 m)    Weight:   Wt Readings from Last 1 Encounters:  07/25/22 69.9 kg    Ideal Body Weight:    52 kg  BMI:  Body mass index is 27.3 kg/m.  Estimated Nutritional Needs:   Kcal:  1800-2000  Protein:  100-115g  Fluid:  >/=1.[place] MS,RD,CSG,LDN Contact: Shea Evans

## 2022-07-25 NOTE — Progress Notes (Signed)
PHARMACY - TOTAL PARENTERAL NUTRITION CONSULT NOTE   Indication: intolerance to enteral feeding  Patient Measurements: Height: '5\' 3"'$  (160 cm) Weight: 67.5 kg (148 lb 14.4 oz) IBW/kg (Calculated) : 52.4 TPN AdjBW (KG): 56.2 Body mass index is 26.38 kg/m. Usual Weight:   Assessment:  GI recommending TPN as patient may not be candidate for NJ tube due to inflammation.  Glucose / Insulin: 56-93 Electrolytes: mag 1.5 Renal: Scr < 0.3 Hepatic: WNL  Intake / Output; MIVF: D51/2NS w/ Kcl 40 mEq/L @ 75 mL/hr GI Imaging: GI Surgeries / Procedures:   Central access: PICC placed 12/20 TPN start date: 12/20  Nutritional Goals: Goal TPN rate is 80 mL/hr (provides 111 g of protein and 1808 kcals per day)  RD Assessment: Estimated Needs Total Energy Estimated Needs: 1800-2000 Total Protein Estimated Needs: 100-115g Total Fluid Estimated Needs: >/=1.8L  Current Nutrition:  Clearl liquids- Boost TID and prosource plus  Plan:  Start TPN at 40 mL/hr at 1800 Electrolytes in TPN: Na 104mq/L, K 573m/L, Ca 39m81mL, Mg 39mE639m, and Phos 139mm47m. Cl:Ac 1:1 Add standard MVI and trace elements to TPN Initiate Sensitive q6h SSI and adjust as needed  Reduce MIVF to 40 mL/hr at 1800 Monitor TPN labs on Mon/Thurs,   SteveMargot AblesrmD Clinical Pharmacist 07/25/2022 10:30 AM

## 2022-07-26 DIAGNOSIS — I81 Portal vein thrombosis: Secondary | ICD-10-CM | POA: Diagnosis not present

## 2022-07-26 DIAGNOSIS — K861 Other chronic pancreatitis: Secondary | ICD-10-CM | POA: Diagnosis not present

## 2022-07-26 DIAGNOSIS — K859 Acute pancreatitis without necrosis or infection, unspecified: Secondary | ICD-10-CM | POA: Diagnosis not present

## 2022-07-26 DIAGNOSIS — E119 Type 2 diabetes mellitus without complications: Secondary | ICD-10-CM | POA: Diagnosis not present

## 2022-07-26 LAB — CBC WITH DIFFERENTIAL/PLATELET
Abs Immature Granulocytes: 0.03 10*3/uL (ref 0.00–0.07)
Basophils Absolute: 0 10*3/uL (ref 0.0–0.1)
Basophils Relative: 1 %
Eosinophils Absolute: 0.5 10*3/uL (ref 0.0–0.5)
Eosinophils Relative: 7 %
HCT: 33.8 % — ABNORMAL LOW (ref 36.0–46.0)
Hemoglobin: 10.4 g/dL — ABNORMAL LOW (ref 12.0–15.0)
Immature Granulocytes: 0 %
Lymphocytes Relative: 36 %
Lymphs Abs: 2.7 10*3/uL (ref 0.7–4.0)
MCH: 28.6 pg (ref 26.0–34.0)
MCHC: 30.8 g/dL (ref 30.0–36.0)
MCV: 92.9 fL (ref 80.0–100.0)
Monocytes Absolute: 0.7 10*3/uL (ref 0.1–1.0)
Monocytes Relative: 9 %
Neutro Abs: 3.7 10*3/uL (ref 1.7–7.7)
Neutrophils Relative %: 47 %
Platelets: 478 10*3/uL — ABNORMAL HIGH (ref 150–400)
RBC: 3.64 MIL/uL — ABNORMAL LOW (ref 3.87–5.11)
RDW: 17.5 % — ABNORMAL HIGH (ref 11.5–15.5)
WBC: 7.6 10*3/uL (ref 4.0–10.5)
nRBC: 0 % (ref 0.0–0.2)

## 2022-07-26 LAB — MAGNESIUM
Magnesium: 1.9 mg/dL (ref 1.7–2.4)
Magnesium: 1.6 mg/dL — ABNORMAL LOW (ref 1.7–2.4)

## 2022-07-26 LAB — COMPREHENSIVE METABOLIC PANEL
ALT: 8 U/L (ref 0–44)
AST: 16 U/L (ref 15–41)
Albumin: 2.1 g/dL — ABNORMAL LOW (ref 3.5–5.0)
Alkaline Phosphatase: 66 U/L (ref 38–126)
Anion gap: 6 (ref 5–15)
BUN: 5 mg/dL — ABNORMAL LOW (ref 6–20)
CO2: 27 mmol/L (ref 22–32)
Calcium: 8.4 mg/dL — ABNORMAL LOW (ref 8.9–10.3)
Chloride: 105 mmol/L (ref 98–111)
Creatinine, Ser: 0.3 mg/dL — ABNORMAL LOW (ref 0.44–1.00)
GFR, Estimated: >60 mL/min (ref >60)
Glucose, Bld: 120 mg/dL — ABNORMAL HIGH (ref 70–99)
Potassium: 4.1 mmol/L (ref 3.5–5.1)
Sodium: 138 mmol/L (ref 135–145)
Total Bilirubin: 0.4 mg/dL (ref 0.3–1.2)
Total Protein: 6.4 g/dL — ABNORMAL LOW (ref 6.5–8.1)

## 2022-07-26 LAB — PHOSPHORUS
Phosphorus: 3.7 mg/dL (ref 2.5–4.6)
Phosphorus: 3.8 mg/dL (ref 2.5–4.6)

## 2022-07-26 LAB — GLUCOSE, CAPILLARY
Glucose-Capillary: 111 mg/dL — ABNORMAL HIGH (ref 70–99)
Glucose-Capillary: 127 mg/dL — ABNORMAL HIGH (ref 70–99)

## 2022-07-26 LAB — POTASSIUM: Potassium: 4 mmol/L (ref 3.5–5.1)

## 2022-07-26 LAB — TRIGLYCERIDES: Triglycerides: 122 mg/dL (ref <150)

## 2022-07-26 MED ORDER — POLYETHYLENE GLYCOL 3350 17 G PO PACK
17.0000 g | PACK | Freq: Every day | ORAL | Status: DC
  Administered 2022-07-26: 17 g via ORAL
  Filled 2022-07-26 (×2): qty 1

## 2022-07-26 MED ORDER — PNEUMOCOCCAL 20-VAL CONJ VACC 0.5 ML IM SUSY
0.5000 mL | PREFILLED_SYRINGE | INTRAMUSCULAR | Status: DC
  Filled 2022-07-26: qty 0.5

## 2022-07-26 MED ORDER — SENNOSIDES-DOCUSATE SODIUM 8.6-50 MG PO TABS
1.0000 | ORAL_TABLET | Freq: Two times a day (BID) | ORAL | Status: DC
  Administered 2022-07-26 (×2): 1 via ORAL
  Filled 2022-07-26 (×3): qty 1

## 2022-07-26 MED ORDER — TRAVASOL 10 % IV SOLN
INTRAVENOUS | Status: DC
  Filled 2022-07-26: qty 1113.6

## 2022-07-26 NOTE — Progress Notes (Signed)
[facility].  Meds bagged and given to [facility] to place with pharmacy for security/safety.

## 2022-07-26 NOTE — Progress Notes (Signed)
Progress Note    Jillian Shepherd   PNT:[id number]  DOB: 26-Oct-1985  DOA: 07/21/2022     5 PCP: [name], MD  Initial CC: abdominal pain, N/V  Hospital Course: Jillian Shepherd is a 36 yo female with PMH Liddle syndrome, obesity, alcoholic pancreatitis/chronic pancreatitis with pseudocyst formation. She has been admitted multiple times for recurrent acute on chronic pancreatitis.  She states she has not had alcohol in approximately 4 months prior to admission but has continued to have ongoing abdominal pain and unable to tolerate solid foods recently. She has a known subcapsular perisplenic hematoma and on prior hospitalization she was also found to have a nonocclusive thrombus involving the main and right portal veins and splenic vein thrombosis on CT on 07/08/22.  She is not considered anticoagulation candidate per GI due to perisplenic hematoma.  Due to persistent abdominal pain and inability to tolerate oral nutrition, she was recommended for initiating on TPN.  PICC line was placed on 07/25/2022.  Interval History:  No events overnight.  TPN has been infusing and pain seems to be better controlled today.  She likes the oxycodone better than Dilaudid due to lasting longer. Trying to get home TPN arranged in anticipation for discharge.  Assessment and Plan:  Acute on chronic pancreatitis Pseudocyst formation -Persistent moderate pancreatitis without change noted on CT A/P on 07/21/2022.  Prior CT was 07/08/2022.  No evidence of necrosis noted -Also has mild increase of 3.5 cm pancreatic pseudocyst adjacent to pancreatic tail -GI following, appreciate assistance.  Patient has been unable to tolerate adequate oral nutrition and was recommended for initiation of TPN - PICC placed on 07/25/2022 and TPN has been started with plans for continuing TPN at home and slow diet advancement - will need to follow up with her outpatient GI, Jillian Shepherd as well; patient aware -She is also recommended  to have 56-monthfollow-up pancreatic protocol CT per GI as well   Nonocclusive thrombus of the portal vein/splenic vein thrombosis Large subcapsular splenic hematoma -Noted on CTs on 12/3 and 12/16; most recent CT states mild decrease in size of hematoma (now 13.7 x 9.7 cm from prior of 14.3 x 10.5 cm) - no anticoagulation as per GI due to hematoma    Hypokalemia -Replete as needed   Leukocytosis -resolved -Low suspicion for infection.  Considered reactive - WBC has slowly down trended   Essential hypertension -Continue amlodipine   Liddle's syndrome -Monitoring BP and electrolytes   Old records reviewed in assessment of this patient  Antimicrobials:   DVT prophylaxis:     Code Status:   Code Status: Full Code  Mobility Assessment (last 72 hours)     Mobility Assessment     Row Name 07/26/22 0900 07/25/22 2104 07/24/22 0912       Does patient have an order for bedrest or is patient medically unstable No - Continue assessment No - Continue assessment No - Continue assessment     What is the highest level of mobility based on the progressive mobility assessment? Level 6 (Walks independently in room and hall) - Balance while walking in room without assist - Complete Level 6 (Walks independently in room and hall) - Balance while walking in room without assist - Complete Level 5 (Walks with assist in room/hall) - Balance while stepping forward/back and can walk in room with assist - Complete     Is the above level different from baseline mobility prior to current illness? No - Consider discontinuing PT/OT Yes -  Recommend PT order Yes - Recommend PT order              Barriers to discharge:  Disposition Plan: Home 1-2 days Status is: Inpatient  Objective: Blood pressure 119/78, pulse 92, temperature 97.8 F (36.6 C), temperature source Oral, resp. rate 18, height '5\' 3"'$  (1.6 m), weight 69.9 kg, last menstrual period 07/01/2022, SpO2 95 %.  Examination:  Physical  Exam Constitutional:      General: She is not in acute distress.    Appearance: She is well-developed.  HENT:     Head: Normocephalic and atraumatic.     Mouth/Throat:     Mouth: Mucous membranes are moist.  Eyes:     Extraocular Movements: Extraocular movements intact.  Cardiovascular:     Rate and Rhythm: Normal rate and regular rhythm.  Pulmonary:     Effort: Pulmonary effort is normal.     Breath sounds: Normal breath sounds.  Abdominal:     General: There is no distension.     Palpations: Abdomen is soft.     Comments: Generalized tenderness to palpation, no rebound or guarding  Musculoskeletal:        General: Normal range of motion.     Cervical back: Normal range of motion and neck supple.  Skin:    General: Skin is warm and dry.  Neurological:     General: No focal deficit present.     Mental Status: She is alert.  Psychiatric:        Mood and Affect: Mood normal.      Consultants:  GI  Procedures:  07/25/2022: PICC placement  Data Reviewed: Results for orders placed or performed during the hospital encounter of 07/21/22 (from the past 24 hour(s))  Potassium     Status: None   Collection Time: 07/25/22  6:00 PM  Result Value Ref Range   Potassium 4.0 3.5 - 5.1 mmol/L  Magnesium     Status: None   Collection Time: 07/25/22  6:00 PM  Result Value Ref Range   Magnesium 1.9 1.7 - 2.4 mg/dL  Phosphorus     Status: None   Collection Time: 07/25/22  6:00 PM  Result Value Ref Range   Phosphorus 2.7 2.5 - 4.6 mg/dL  Glucose, capillary     Status: Abnormal   Collection Time: 07/25/22 11:53 PM  Result Value Ref Range   Glucose-Capillary 110 (H) 70 - 99 mg/dL  CBC with Differential/Platelet     Status: Abnormal   Collection Time: 07/26/22  4:27 AM  Result Value Ref Range   WBC 7.6 4.0 - 10.5 K/uL   RBC 3.64 (L) 3.87 - 5.11 MIL/uL   Hemoglobin 10.4 (L) 12.0 - 15.0 g/dL   HCT 33.8 (L) 36.0 - 46.0 %   MCV 92.9 80.0 - 100.0 fL   MCH 28.6 26.0 - 34.0 pg   MCHC  30.8 30.0 - 36.0 g/dL   RDW 17.5 (H) 11.5 - 15.5 %   Platelets 478 (H) 150 - 400 K/uL   nRBC 0.0 0.0 - 0.2 %   Neutrophils Relative % 47 %   Neutro Abs 3.7 1.7 - 7.7 K/uL   Lymphocytes Relative 36 %   Lymphs Abs 2.7 0.7 - 4.0 K/uL   Monocytes Relative 9 %   Monocytes Absolute 0.7 0.1 - 1.0 K/uL   Eosinophils Relative 7 %   Eosinophils Absolute 0.5 0.0 - 0.5 K/uL   Basophils Relative 1 %   Basophils Absolute 0.0 0.0 - 0.1  K/uL   Immature Granulocytes 0 %   Abs Immature Granulocytes 0.03 0.00 - 0.07 K/uL  Comprehensive metabolic panel     Status: Abnormal   Collection Time: 07/26/22  6:36 AM  Result Value Ref Range   Sodium 138 135 - 145 mmol/L   Potassium 4.1 3.5 - 5.1 mmol/L   Chloride 105 98 - 111 mmol/L   CO2 27 22 - 32 mmol/L   Glucose, Bld 120 (H) 70 - 99 mg/dL   BUN 5 (L) 6 - 20 mg/dL   Creatinine, Ser 0.30 (L) 0.44 - 1.00 mg/dL   Calcium 8.4 (L) 8.9 - 10.3 mg/dL   Total Protein 6.4 (L) 6.5 - 8.1 g/dL   Albumin 2.1 (L) 3.5 - 5.0 g/dL   AST 16 15 - 41 U/L   ALT 8 0 - 44 U/L   Alkaline Phosphatase 66 38 - 126 U/L   Total Bilirubin 0.4 0.3 - 1.2 mg/dL   GFR, Estimated >60 >60 mL/min   Anion gap 6 5 - 15  Magnesium     Status: None   Collection Time: 07/26/22  6:36 AM  Result Value Ref Range   Magnesium 1.9 1.7 - 2.4 mg/dL  Phosphorus     Status: None   Collection Time: 07/26/22  6:36 AM  Result Value Ref Range   Phosphorus 3.7 2.5 - 4.6 mg/dL  Triglycerides     Status: None   Collection Time: 07/26/22  6:36 AM  Result Value Ref Range   Triglycerides 122 <150 mg/dL  Glucose, capillary     Status: Abnormal   Collection Time: 07/26/22 11:35 AM  Result Value Ref Range   Glucose-Capillary 111 (H) 70 - 99 mg/dL  Glucose, capillary     Status: Abnormal   Collection Time: 07/26/22  4:37 PM  Result Value Ref Range   Glucose-Capillary 127 (H) 70 - 99 mg/dL    I have Reviewed nursing notes, Vitals, and Lab results since pt's last encounter. Pertinent lab results : see  above I have ordered labwork to follow up on.  I have reviewed the last note from staff over past 24 hours I have discussed pt's care plan and test results with nursing staff, CM/SW, and other staff as appropriate  Time spent: Greater than 50% of the 55 minute visit was spent in counseling/coordination of care for the patient as laid out in the A&P.   LOS: 5 days   Dwyane Dee, MD [facility] 07/26/2022, 4:53 PM

## 2022-07-26 NOTE — Progress Notes (Signed)
Gastroenterology Progress Note   Referring Provider: No ref. provider found Primary Care Physician:  [name], MD Primary Gastroenterologist:  [facility]  Patient ID: Jillian Shepherd; [id number]; [birth year]   Subjective:    Patient feels a lot better today. Tolerating some clear liquids without increased abdominal pain. Tolerating TPN. Last dose of dilaudid was at 7am. She has been managing pain with oxycodone. She wants to be home by Saturday.   Objective:   Vital signs in last 24 hours: Temp:  [97.8 F (36.6 C)-98.2 F (36.8 C)] 97.8 F (36.6 C) (12/21 1251) Pulse Rate:  [78-92] 92 (12/21 1251) Resp:  [18] 18 (12/21 0401) BP: (101-119)/(75-78) 119/78 (12/21 1251) SpO2:  [93 %-95 %] 95 % (12/21 1251) Last BM Date : 07/25/22 General:   Alert,  looks much better today. Sitting up in bed. Cooperative in NAD Head:  Normocephalic and atraumatic. Eyes:  Sclera clear, no icterus.  Abdomen:  Soft,  nondistended.  Mild epigastric tenderness. Normal bowel sounds, without guarding, and without rebound.   Extremities:  Without clubbing, deformity or edema. Neurologic:  Alert and  oriented x4;  grossly normal neurologically. Skin:  Intact without significant lesions or rashes. Psych:  Alert and cooperative. Normal mood and affect.  Intake/Output from previous day: 12/20 0701 - 12/21 0700 In: 3298.3 [P.O.:1440; I.V.:1808.3; IV Piggyback:50] Out: -  Intake/Output this shift: Total I/O In: 720 [P.O.:720] Out: -   Lab Results: CBC Recent Labs    07/24/22 0503 07/24/22 1744 07/26/22 0427  WBC 10.5 10.1 7.6  HGB 10.2* 10.6* 10.4*  HCT 31.7* 33.6* 33.8*  MCV 89.8 88.9 92.9  PLT 402* 441* 478*   BMET Recent Labs    07/25/22 0444 07/25/22 1800 07/26/22 0636  NA 136  --  138  K 3.8 4.0 4.1  CL 102  --  105  CO2 29  --  27  GLUCOSE 93  --  120*  BUN <5*  --  5*  CREATININE <0.30*  --  0.30*  CALCIUM 8.4*  --  8.4*   LFTs Recent Labs    07/26/22 0636   BILITOT 0.4  ALKPHOS 66  AST 16  ALT 8  PROT 6.4*  ALBUMIN 2.1*   No results for input(s): "LIPASE" in the last 72 hours. PT/INR No results for input(s): "LABPROT", "INR" in the last 72 hours.       Imaging Studies: [place] EKG SITE RITE  Result Date: 07/24/2022 If Site Rite image not attached, placement could not be confirmed due to current cardiac rhythm.  CT ABDOMEN PELVIS W CONTRAST  Result Date: 07/21/2022 CLINICAL DATA:  Follow-up acute pancreatitis and splenic hematoma. Worsening abdominal pain for several days. Nausea and vomiting. EXAM: CT ABDOMEN AND PELVIS WITH CONTRAST TECHNIQUE: Multidetector CT imaging of the abdomen and pelvis was performed using the standard protocol following bolus administration of intravenous contrast. RADIATION DOSE REDUCTION: This exam was performed according to the departmental dose-optimization program which includes automated exposure control, adjustment of the mA and/or kV according to patient size and/or use of iterative reconstruction technique. CONTRAST:  159m OMNIPAQUE IOHEXOL 300 MG/ML  SOLN COMPARISON:  07/08/2022 FINDINGS: Lower Chest: Small left pleural effusion and left lower lobe atelectasis have decreased since prior study. Hepatobiliary: A 7 mm hypervascular lesion is seen in the posterior liver dome. This remains stable in retrospect compared to previous study, likely representing a flash-filling hemangioma. Gallbladder is unremarkable. No evidence of biliary ductal dilatation. Pancreas: Peripancreatic edema is again seen surrounding  the pancreatic body and tail, consistent with acute pancreatitis. A 3.5 x 1.9 cm fluid collection is seen adjacent to the pancreatic tail, mildly increased from 2.2 x 1.9 cm previously. This is consistent with a pancreatic pseudocyst. No evidence of pancreatic ductal dilatation. Spleen: A large subcapsular hematoma is again seen along the lateral aspect of the spleen, which is slightly decreased in size. This  currently measures 13.7 x 9.7 cm, compared to 14.3 x 10.5 cm previously. Adrenals/Urinary Tract: No suspicious masses identified. No evidence of ureteral calculi or hydronephrosis. Stomach/Bowel: No evidence of obstruction, inflammatory process or abnormal fluid collections. Normal appendix visualized. Vascular/Lymphatic: 12 mm lymph node in the porta hepatis remains stable, likely reactive in etiology. Thrombosis of the proximal splenic vein is again seen with venous collaterals in the left upper quadrant. Nonocclusive thrombus again seen throughout the portal veins, which is increased in the left portal vein since previous study. Reproductive:  No mass or other significant abnormality. Other:  None. Musculoskeletal:  No suspicious bone lesions identified. IMPRESSION: Moderate acute pancreatitis, without significant change. Mild increase in size of 3.5 cm pancreatic pseudocyst adjacent to the pancreatic tail. Mild decrease in size of large subcapsular splenic hematoma. 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 Nonocclusive thrombus throughout the portal veins, which is increased in the left portal vein since previous study. Decreased small left pleural effusion and left lower lobe atelectasis. Stable 7 mm hypervascular lesion in posterior liver dome, likely representing a flash-filling hemangioma. Recommend continued attention on follow-up imaging. Electronically Signed   By: Marlaine Hind M.D.   On: 07/21/2022 14:28   US Abdomen Complete  Result Date: 07/21/2022 CLINICAL DATA:  Abdominal pain. Known acute pancreatitis, splenic hematoma, and pancreatic pseudocyst. Technologist indicates limited study due to patient pain tolerance, bowel gas, and limited mobility. EXAM: ABDOMEN ULTRASOUND COMPLETE COMPARISON:  CT abdomen and pelvis July 08, 2022 FINDINGS: Gallbladder: Layering echogenic material within the gallbladder, likely sludge. No gallstones or wall  thickening visualized. Positive sonographic Murphy sign noted by the sonographer, however limited assessment in the setting acute pancreatitis. Common bile duct: Diameter: 7 mm Liver: Increased and heterogeneous hepatic parenchymal echogenicity. There is also a more focal masslike echogenic area in the left hepatic lobe that measures 5.4 x 5.3 x 5.1 cm. Near-occlusive thrombus in the main portal vein. IVC: No abnormality visualized. Pancreas: Limited visualization secondary to overlying bowel gas. The known pancreatic pseudocyst is not well seen. Spleen: Heterogeneous perisplenic fluid collection that measures approximately 11.3 x 6.1 x 7.0 cm. Right Kidney: Length: 12.2 cm. Echogenicity within normal limits. No mass or hydronephrosis visualized. Left Kidney: Length: 11.9 cm. Echogenicity within normal limits. No mass or hydronephrosis visualized. Abdominal aorta: No aneurysm visualized. Other findings: None. IMPRESSION: 1. Large perisplenic hematoma, better assessed on recent CT. 2. Near-occlusive thrombus in the main portal vein. 3. There is a 5.4 cm focal masslike echogenic area in the left hepatic lobe, incompletely assessed on ultrasound. Differential considerations include differential hepatic perfusion in the setting of portal vein thrombus, hematoma, or focal mass. Recommend further assessment with liver MRI with and without contrast. 4. Gallbladder sludge without sonographic evidence of acute cholecystitis. The mildly dilated common bile duct and positive sonographic Murphy sign noted by the sonographer is favored to be related to known acute pancreatitis. Electronically Signed   By: Beryle Flock M.D.   On: 07/21/2022 12:32   CT ABDOMEN PELVIS W CONTRAST  Result Date: 07/08/2022 CLINICAL DATA:  Follow-up acute pancreatitis and splenic hematoma. EXAM: CT ABDOMEN AND PELVIS WITH CONTRAST TECHNIQUE: Multidetector CT imaging of the abdomen and pelvis was performed using the standard protocol following  bolus administration of intravenous contrast. RADIATION DOSE REDUCTION: This exam was performed according to the departmental dose-optimization program which includes automated exposure control, adjustment of the mA and/or kV according to patient size and/or use of iterative reconstruction technique. CONTRAST:  154m OMNIPAQUE IOHEXOL 300 MG/ML  SOLN COMPARISON:  06/30/2022 FINDINGS: Lower Chest: Moderate left pleural effusion and left lower lobe atelectasis show mild increase since previous study. Hepatobiliary: No hepatic masses identified. Nonocclusive thrombus is seen in the main and right portal veins. Gallbladder is unremarkable. No evidence of biliary ductal dilatation. Pancreas: Moderate acute pancreatitis shows no significant change. A 2.2 x 1.9 cm fluid collection in the splenic tail is also unchanged and consistent with a small pseudocyst. Spleen: Large subcapsular splenic hematoma measures 14.3 x 10.5 cm, without significant change in size compared to prior study. Splenic vein thrombosis also noted with venous collateral seen in the gastrosplenic ligament. Adrenals/Urinary Tract: No suspicious masses identified. No evidence of ureteral calculi or hydronephrosis. Stomach/Bowel: Nasogastric tube tip is seen in the distal gastric body. No evidence of obstruction, inflammatory process or abnormal fluid collections. Vascular/Lymphatic: No pathologically enlarged lymph nodes. Nonocclusive thrombus in the main and right portal veins and splenic vein thrombosis again noted, as described above. Reproductive:  No mass or other significant abnormality. Other:  None. Musculoskeletal:  No suspicious bone lesions identified. IMPRESSION: No significant change in moderate acute pancreatitis. Stable 2.2 cm pseudocyst in splenic tail. No significant change in large subcapsular splenic hematoma. Nonocclusive thrombus in the main and right portal veins. Splenic vein thrombosis. Increased moderate left pleural effusion and  left lower lobe atelectasis. Electronically Signed   By: JMarlaine HindM.D.   On: 07/08/2022 10:02   DG Chest Port 1 View  Result Date: 07/07/2022 CLINICAL DATA:  Assess for nasogastric tube placement EXAM: PORTABLE CHEST 1 VIEW COMPARISON:  June 30, 2022 FINDINGS: The heart size and mediastinal contours are stable. Patchy consolidation of left lung base with left pleural effusion noted. Mild patchy consolidation of medial right lung base is noted. Nasogastric tube is identified with distal tip in the distal stomach. The visualized skeletal structures are stable. IMPRESSION: Nasogastric tube with distal tip in the distal stomach. Patchy consolidation of left lung base with left pleural effusion noted. Mild patchy consolidation of medial right lung base is noted. Electronically Signed   By: WAbelardo DieselM.D.   On: 07/07/2022 09:19   CT ABDOMEN PELVIS W CONTRAST  Result Date: 06/30/2022 CLINICAL DATA:  Abdominal pain, acute, nonlocalized diffuse pain, recent pancreatitis EXAM: CT ABDOMEN AND PELVIS WITH CONTRAST TECHNIQUE: Multidetector CT imaging of the abdomen and pelvis was performed using the standard protocol following bolus administration of intravenous contrast. RADIATION DOSE REDUCTION: This exam was performed according to the departmental dose-optimization program which includes automated exposure control, adjustment of the mA and/or kV according to patient size and/or use of iterative reconstruction technique. CONTRAST:  1019mOMNIPAQUE IOHEXOL 300 MG/ML  SOLN COMPARISON:  None Available. FINDINGS: Lower chest: Trace bilateral, left greater than right, pleural effusion. Associated passive atelectasis of left lower lobe. Hepatobiliary: The liver is enlarged measuring up to 19 cm. No focal liver abnormality. No gallstones or definite gallbladder wall thickening. Positive for pericholecystic fluid. No biliary dilatation. Pancreas: No focal lesion. Hazy pancreatic contour with associated  peripancreatic fat stranding and free fluid.  Slightly decreased in size cystic lesion measuring 2 x 2 cm along the distal pancreatic body. Poorly visualized distal pancreatic tail likely due to cystic changes and edema with underlying nonenhancement of the pancreatic tail not fully excluded. Interval resolution of cystic lesion along the greater curvature of the stomach. No main pancreatic ductal dilatation. Spleen: Similar-appearing heterogeneous 14 x 8 cm perisplenic subcapsular lesion likely representing a hematoma. Adrenals/Urinary Tract: No focal lesion. Normal pancreatic contour. No surrounding inflammatory changes. No main pancreatic ductal dilatation. Stomach/Bowel: Stomach is within normal limits. No evidence of bowel wall thickening or dilatation. Appendix appears normal. Vascular/Lymphatic: No splenic artery aneurysm identified. Venous collaterals noted. No abdominal aorta or iliac aneurysm. Mild atherosclerotic plaque of the aorta and its branches. No abdominal, pelvic, or inguinal lymphadenopathy. Reproductive: Uterus and bilateral adnexa are unremarkable. Other: Small volume simple free fluid ascites. No intraperitoneal free gas. No organized fluid collection. Musculoskeletal: No abdominal wall hernia or abnormality. Soft tissue density along the anterior abdominal wall likely due to medication injection. No suspicious lytic or blastic osseous lesions. No acute displaced fracture. Multilevel degenerative changes of the spine. IMPRESSION: 1. Acute pancreatitis with interval decrease in size of a distal pancreatic body cystic lesion likely representing a pseudocyst. Interval resolution of cystic lesion along the greater curvature of the stomach. Component of necrotizing pancreatitis along the pancreatic tail is not fully excluded. 2. Similar-appearing heterogeneous 14 x 8 cm perisplenic subcapsular hematoma. 3. Nonspecific pericholecystic fluid with no definite CT findings acute cholecystitis. 4. Small  volume simple free fluid ascites. 5. Trace bilateral, left greater than right, pleural effusion. 6. Mild hepatomegaly. 7. Venous collaterals. Electronically Signed   By: Iven Finn M.D.   On: 06/30/2022 22:45   DG Chest Port 1 View  Result Date: 06/30/2022 CLINICAL DATA:  Recent effusion with decreased lung sounds on the left EXAM: PORTABLE CHEST 1 VIEW COMPARISON:  Radiographs 06/13/2022 FINDINGS: Slight decrease in small left pleural effusion and associated atelectasis since 06/13/2022. Possible trace right pleural effusion. No pneumothorax. Stable cardiomediastinal silhouette. No acute osseous abnormality. IMPRESSION: Since 06/13/2022, decreased small left pleural effusion and associated atelectasis. Pneumonia is difficult to exclude. Electronically Signed   By: Placido Sou M.D.   On: 06/30/2022 18:26  [2 weeks]  Assessment:   36 year old female with history of alcohol abuse complicated by recurrent alcoholic pancreatitis, hypertension, Liddle syndrome, splenic hematoma and splenic vein thrombosis, presenting this admission with worsening abdominal pain due to recurrent pancreatitis.  Well-known to GI with recent hospitalization for recurrent pancreatitis.   Recurrent pancreatitis: Denies alcohol intake.  Pancreatic tail pseudocyst has increased in size. Worsening nonocclusive portal vein thrombosis could be contributing to abdominal pain.  Due to risk of bleeding in light of splenic hematoma, holding off on anticoagulation. Dr. Jenetta Downer consulted with IR, see separate note, dated 07/22/22. If she were to have worsening abdominal pain, would need CT to assess if thrombosis has extended to mesenteric vasculature. At this time her pain is stable.   Patient started TPN yesterday. She states she is noticing improvement, already feels more energy. Has been taking sips of clear and no change in abdominal pain. The burning pain she presented with has resolved. She wonders if related to significant  amounts of ibuprofen she used at home once her four day supply of pain medication ran out.   She has been able to use oral pain medication today with good control.   Plan:   TPN per attending.  Continue PPI BID. Continue clear liquids.  Avoid all etoh.  She will need to follow up with her primary gastroenterologist at [place] (Dr. Sherri Sear) after discharge.  She will need 2 month follow up pancreatic protocol CT, which will need to be arranged by Dr. Marius Ditch.  Will sign off. Call with any questions.    LOS: 5 days   Laureen Ochs. Bernarda Caffey [facility] [phone] 12/21/20232:16 PM

## 2022-07-26 NOTE — Progress Notes (Signed)
PHARMACY - TOTAL PARENTERAL NUTRITION CONSULT NOTE   Indication: intolerance to enteral feeding  Patient Measurements: Height: '5\' 3"'$  (160 cm) Weight: 69.9 kg (154 lb 1.6 oz) IBW/kg (Calculated) : 52.4 TPN AdjBW (KG): 56.2 Body mass index is 27.3 kg/m. Usual Weight:   Assessment:  GI recommending TPN as patient may not be candidate for NJ tube due to inflammation.  Glucose / Insulin: 110-120 Electrolytes: mag 1.5 Renal: Scr < 0.3 Hepatic: WNL  Intake / Output; MIVF: D51/2NS w/ Kcl 40 mEq/L @ 40 mL/hr GI Imaging: GI Surgeries / Procedures:   Central access: PICC placed 12/20 TPN start date: 12/20  Nutritional Goals: Goal TPN rate is 80 mL/hr (provides 111 g of protein and 1808 kcals per day)  RD Assessment: Estimated Needs Total Energy Estimated Needs: 1800-2000 Total Protein Estimated Needs: 100-115g Total Fluid Estimated Needs: >/=1.8L  Current Nutrition:  Clearl liquids- Boost TID and prosource plus  Plan:  Increase TPN to 80 mL/hr at 1800 Electrolytes in TPN: Na 70mq/L, K 577m/L, Ca 33m44mL, Mg 33mE233m, and Phos 133mm74m. Cl:Ac 1:1 Add standard MVI and trace elements to TPN Initiate Sensitive q6h SSI and adjust as needed  Stop MIVF  at 1800 Monitor TPN labs on Mon/Thurs,   SteveMargot AblesrmD Clinical Pharmacist 07/26/2022 8:41 AM

## 2022-07-26 NOTE — Progress Notes (Signed)
Patient mentioned to this nurse this morning that she was ready to go home. This nurse educated patient the first thing we needed to do to prepare to go home is to be able to tolerate pain management on po pain meds instead of IV pain meds. We did a trial run today on oxy q4 and patient has done well on pain management and hasn't needed the iv dilaudid.

## 2022-07-27 DIAGNOSIS — K861 Other chronic pancreatitis: Secondary | ICD-10-CM | POA: Diagnosis not present

## 2022-07-27 DIAGNOSIS — K859 Acute pancreatitis without necrosis or infection, unspecified: Secondary | ICD-10-CM | POA: Diagnosis not present

## 2022-07-27 LAB — CBC WITH DIFFERENTIAL/PLATELET
Abs Immature Granulocytes: 0.02 10*3/uL (ref 0.00–0.07)
Basophils Absolute: 0.1 10*3/uL (ref 0.0–0.1)
Basophils Relative: 1 %
Eosinophils Absolute: 0.6 10*3/uL — ABNORMAL HIGH (ref 0.0–0.5)
Eosinophils Relative: 8 %
HCT: 33.8 % — ABNORMAL LOW (ref 36.0–46.0)
Hemoglobin: 9.9 g/dL — ABNORMAL LOW (ref 12.0–15.0)
Immature Granulocytes: 0 %
Lymphocytes Relative: 31 %
Lymphs Abs: 2.3 10*3/uL (ref 0.7–4.0)
MCH: 29.9 pg (ref 26.0–34.0)
MCHC: 29.3 g/dL — ABNORMAL LOW (ref 30.0–36.0)
MCV: 102.1 fL — ABNORMAL HIGH (ref 80.0–100.0)
Monocytes Absolute: 0.7 10*3/uL (ref 0.1–1.0)
Monocytes Relative: 10 %
Neutro Abs: 3.6 10*3/uL (ref 1.7–7.7)
Neutrophils Relative %: 50 %
Platelets: 451 10*3/uL — ABNORMAL HIGH (ref 150–400)
RBC: 3.31 MIL/uL — ABNORMAL LOW (ref 3.87–5.11)
RDW: 19.4 % — ABNORMAL HIGH (ref 11.5–15.5)
WBC: 7.4 10*3/uL (ref 4.0–10.5)
nRBC: 0 % (ref 0.0–0.2)

## 2022-07-27 LAB — COMPREHENSIVE METABOLIC PANEL
ALT: 8 U/L (ref 0–44)
AST: 18 U/L (ref 15–41)
Albumin: 2.1 g/dL — ABNORMAL LOW (ref 3.5–5.0)
Alkaline Phosphatase: 66 U/L (ref 38–126)
Anion gap: 4 — ABNORMAL LOW (ref 5–15)
BUN: 11 mg/dL (ref 6–20)
CO2: 27 mmol/L (ref 22–32)
Calcium: 8.3 mg/dL — ABNORMAL LOW (ref 8.9–10.3)
Chloride: 103 mmol/L (ref 98–111)
Creatinine, Ser: 0.39 mg/dL — ABNORMAL LOW (ref 0.44–1.00)
GFR, Estimated: >60 mL/min (ref >60)
Glucose, Bld: 125 mg/dL — ABNORMAL HIGH (ref 70–99)
Potassium: 4.6 mmol/L (ref 3.5–5.1)
Sodium: 134 mmol/L — ABNORMAL LOW (ref 135–145)
Total Bilirubin: 0.5 mg/dL (ref 0.3–1.2)
Total Protein: 6.6 g/dL (ref 6.5–8.1)

## 2022-07-27 LAB — MAGNESIUM: Magnesium: 1.7 mg/dL (ref 1.7–2.4)

## 2022-07-27 LAB — PHOSPHORUS: Phosphorus: 4.7 mg/dL — ABNORMAL HIGH (ref 2.5–4.6)

## 2022-07-27 LAB — GLUCOSE, CAPILLARY
Glucose-Capillary: 117 mg/dL — ABNORMAL HIGH (ref 70–99)
Glucose-Capillary: 132 mg/dL — ABNORMAL HIGH (ref 70–99)
Glucose-Capillary: 143 mg/dL — ABNORMAL HIGH (ref 70–99)

## 2022-07-27 MED ORDER — TRAVASOL 10 % IV SOLN
INTRAVENOUS | Status: DC
  Filled 2022-07-27: qty 1113.6

## 2022-07-27 MED ORDER — OXYCODONE HCL 10 MG PO TABS: 5.0000 mg | ORAL_TABLET | ORAL | 0 refills | Status: DC | PRN

## 2022-07-27 NOTE — TOC Transition Note (Signed)
Transition of Care [facility]) - CM/SW Discharge Note   Patient Details  Name: Jillian Shepherd MRN: [id number] Date of Birth: Aug 26, 1985  Transition of Care [facility]) CM/SW Contact:  Iona Beard, [employer] Phone Number: 07/27/2022, 12:09 PM   Clinical Narrative:    CSW spoke to [facility] with [employer] who states she has been able to get pt set up with [facility] RN that will see pt tomorrow 12/23 at 6pm for her TPN. CSW requested that MD [facility] RN orders. TOC signing off.   Final next level of care: Home w Home Health Services Barriers to Discharge: Barriers Resolved   Patient Goals and CMS Choice CMS Medicare.gov Compare Post Acute Care list provided to:: Patient Choice offered to / list presented to : Patient  Discharge Placement                         Discharge Plan and Services Additional resources added to the After Visit Summary for                            [facility] Arranged: RN          Social Determinants of Health (SDOH) Interventions SDOH Screenings   Food Insecurity: No Food Insecurity (07/22/2022)  Housing: Low Risk  (07/22/2022)  Transportation Needs: No Transportation Needs (07/22/2022)  Utilities: Not At Risk (07/22/2022)  Depression (PHQ2-9): Low Risk  (05/17/2022)  Tobacco Use: High Risk (07/21/2022)     Readmission Risk Interventions     No data to display

## 2022-07-27 NOTE — Progress Notes (Signed)
Patient discharging home with right double lumen PICC line for TPN at home. Patient's PICC line flushed. Patient dressed herself. Patient received her home medications back from pharmacy. Writer went over discharge paperwork with patient and she verbalized understanding. Patient transported via [facility] to car for discharge, her husband picked her up.

## 2022-07-27 NOTE — Discharge Summary (Signed)
Physician Discharge Summary   Kalliopi Coupland KYH:[id number] DOB: 05-05-1986 DOA: 07/21/2022  PCP: [name], MD  Admit date: 07/21/2022 Discharge date: 07/27/2022 Barriers to discharge: none  Admitted From: Home Disposition:  Home Discharging physician: Dwyane Dee, MD  Recommendations for Outpatient Follow-up:  Continue home TPN; patient to advance diet slowly as able Follow up with GI  Home Health: [facility] Equipment/Devices:   Discharge Condition: stable CODE STATUS: Full Diet recommendation:  Diet Orders (From admission, onward)     Start     Ordered   07/23/22 1226  Diet clear liquid Room service appropriate? Yes; Fluid consistency: Thin  Diet effective now       Question Answer Comment  Room service appropriate? Yes   Fluid consistency: Thin      07/23/22 1225            Hospital Course: Ms. Danser is a 36 yo female with PMH Liddle syndrome, obesity, alcoholic pancreatitis/chronic pancreatitis with pseudocyst formation. She has been admitted multiple times for recurrent acute on chronic pancreatitis.  She states she has not had alcohol in approximately 4 months prior to admission but has continued to have ongoing abdominal pain and unable to tolerate solid foods recently. She has a known subcapsular perisplenic hematoma and on prior hospitalization she was also found to have a nonocclusive thrombus involving the main and right portal veins and splenic vein thrombosis on CT on 07/08/22.  She is not considered anticoagulation candidate per GI due to perisplenic hematoma.  Due to persistent abdominal pain and inability to tolerate oral nutrition, she was recommended for initiating on TPN.  PICC line was placed on 07/25/2022.  Assessment and Plan:  Acute on chronic pancreatitis Pseudocyst formation -Persistent moderate pancreatitis without change noted on CT A/P on 07/21/2022.  Prior CT was 07/08/2022.  No evidence of necrosis noted -Also has mild  increase of 3.5 cm pancreatic pseudocyst adjacent to pancreatic tail -GI following, appreciate assistance.  Patient has been unable to tolerate adequate oral nutrition and was recommended for initiation of TPN - PICC placed on 07/25/2022 and TPN has been started with plans for continuing TPN at home and slow diet advancement - will need to follow up with her outpatient GI, Dr. Marius Ditch as well; patient aware -She is also recommended to have 18-monthfollow-up pancreatic protocol CT per GI as well   Nonocclusive thrombus of the portal vein/splenic vein thrombosis Large subcapsular splenic hematoma -Noted on CTs on 12/3 and 12/16; most recent CT states mild decrease in size of hematoma (now 13.7 x 9.7 cm from prior of 14.3 x 10.5 cm) - no anticoagulation as per GI due to hematoma    Hypokalemia -Repleted   Leukocytosis -resolved -Low suspicion for infection.  Considered reactive - WBC has slowly down trended   Essential hypertension -Continue amlodipine   Liddle's syndrome -Monitored BP and electrolytes   The patient's chronic medical conditions were treated accordingly per the patient's home medication regimen except as noted.  On day of discharge, patient was felt deemed stable for discharge. Patient/family member advised to call PCP or come back to ER if needed.   Principal Diagnosis: Acute on chronic pancreatitis ([facility]  Discharge Diagnoses: Active Hospital Problems   Diagnosis Date Noted   Acute on chronic pancreatitis 06/30/2022   Portal vein thrombosis 07/24/2022   Leukocytosis 07/21/2022   Diabetes mellitus type II, controlled [place] 07/01/2022   Pancreatic pseudocyst 06/11/2022   Liddle's syndrome 05/31/2022   Hypokalemia 05/10/2022   Pancreatitis,  acute 07/27/2020   Essential hypertension 07/07/2014    Resolved Hospital Problems  No resolved problems to display.     Discharge Instructions     Increase activity slowly   Complete by: As directed       Allergies as  of 07/27/2022   No Known Allergies      Medication List     STOP taking these medications    ibuprofen 200 MG tablet Commonly known as: ADVIL   menthol-cetylpyridinium 3 MG lozenge Commonly known as: CEPACOL   oxyCODONE-acetaminophen 5-325 MG tablet Commonly known as: PERCOCET/ROXICET       TAKE these medications    amLODipine 10 MG tablet Commonly known as: NORVASC Take 1 tablet (10 mg total) by mouth daily. What changed: when to take this   cyanocobalamin 1000 MCG/ML injection Commonly known as: VITAMIN B12 Inject 1 mL (1,000 mcg total) into the muscle every 30 (thirty) days.   folic acid 1 MG tablet Commonly known as: FOLVITE Take 1 tablet (1 mg total) by mouth daily. What changed: when to take this   lipase/protease/amylase 36000 UNITS Cpep capsule Commonly known as: Creon Take 2 capsules (72,000 Units total) by mouth 3 (three) times daily with meals. May also take 1 capsule (36,000 Units total) as needed (with snacks).   melatonin 3 MG Tabs tablet Take 2 tablets (6 mg total) by mouth at bedtime.   metFORMIN 500 MG tablet Commonly known as: GLUCOPHAGE Take 1 tablet (500 mg total) by mouth 2 (two) times daily with a meal.   ondansetron 4 MG tablet Commonly known as: Zofran Take 1 tablet (4 mg total) by mouth every 6 (six) hours as needed for nausea or vomiting.   Oxycodone HCl 10 MG Tabs Take 0.5-1 tablets (5-10 mg total) by mouth every 4 (four) hours as needed for severe pain.   pantoprazole 40 MG tablet Commonly known as: PROTONIX Take 1 tablet (40 mg total) by mouth daily.        No Known Allergies  Consultations: GI  Procedures:   Discharge Exam: BP 111/84 (BP Location: Left Arm)   Pulse 92   Temp 98.2 F (36.8 C)   Resp 16   Ht '5\' 3"'$  (1.6 m)   Wt 69.9 kg   LMP 07/01/2022 (Approximate)   SpO2 96%   BMI 27.30 kg/m  Physical Exam Constitutional:      General: She is not in acute distress.    Appearance: She is well-developed.   HENT:     Head: Normocephalic and atraumatic.     Mouth/Throat:     Mouth: Mucous membranes are moist.  Eyes:     Extraocular Movements: Extraocular movements intact.  Cardiovascular:     Rate and Rhythm: Normal rate and regular rhythm.  Pulmonary:     Effort: Pulmonary effort is normal.     Breath sounds: Normal breath sounds.  Abdominal:     General: There is no distension.     Palpations: Abdomen is soft.     Comments: Generalized tenderness to palpation, no rebound or guarding  Musculoskeletal:        General: Normal range of motion.     Cervical back: Normal range of motion and neck supple.  Skin:    General: Skin is warm and dry.  Neurological:     General: No focal deficit present.     Mental Status: She is alert.  Psychiatric:        Mood and Affect: Mood normal.  The results of significant diagnostics from this hospitalization (including imaging, microbiology, ancillary and laboratory) are listed below for reference.   Microbiology: No results found for this or any previous visit (from the past 240 hour(s)).   Labs: BNP (last 3 results) Recent Labs    05/12/22 0730  BNP 35.3   Basic Metabolic Panel: Recent Labs  Lab 07/22/22 0621 07/23/22 0543 07/24/22 0503 07/25/22 0444 07/25/22 1800 07/26/22 0636 07/26/22 1851 07/27/22 0633  NA 137 136  --  136  --  138  --  134*  K 2.6* 3.3*   < > 3.8 4.0 4.1 4.0 4.6  CL 99 99  --  102  --  105  --  103  CO2 28 31  --  29  --  27  --  27  GLUCOSE 56* 96  --  93  --  120*  --  125*  BUN 9 8  --  <5*  --  5*  --  11  CREATININE 0.36* 0.38*  --  <0.30*  --  0.30*  --  0.39*  CALCIUM 8.0* 8.4*  --  8.4*  --  8.4*  --  8.3*  MG  --   --    < > 1.5* 1.9 1.9 1.6* 1.7  PHOS  --   --    < > 3.4 2.7 3.7 3.8 4.7*   < > = values in this interval not displayed.   Liver Function Tests: Recent Labs  Lab 07/21/22 1008 07/22/22 0621 07/26/22 0636 07/27/22 0633  AST 44* '29 16 18  '$ ALT '16 13 8 8  '$ ALKPHOS 98 71 66  66  BILITOT 1.3* 1.2 0.4 0.5  PROT 9.3* 6.8 6.4* 6.6  ALBUMIN 3.1* 2.3* 2.1* 2.1*   Recent Labs  Lab 07/21/22 1008 07/22/22 0621  LIPASE 650* 108*   No results for input(s): "AMMONIA" in the last 168 hours. CBC: Recent Labs  Lab 07/21/22 1013 07/22/22 2006 07/23/22 2013 07/24/22 0503 07/24/22 1744 07/26/22 0427 07/27/22 0353  WBC 19.0*   < > 10.8* 10.5 10.1 7.6 7.4  NEUTROABS 15.6*  --   --   --   --  3.7 3.6  HGB 14.1   < > 11.1* 10.2* 10.6* 10.4* 9.9*  HCT 42.6   < > 34.5* 31.7* 33.6* 33.8* 33.8*  MCV 86.8   < > 89.6 89.8 88.9 92.9 102.1*  PLT 519*   < > 419* 402* 441* 478* 451*   < > = values in this interval not displayed.   Cardiac Enzymes: No results for input(s): "CKTOTAL", "CKMB", "CKMBINDEX", "TROPONINI" in the last 168 hours. BNP: Invalid input(s): "POCBNP" CBG: Recent Labs  Lab 07/26/22 1135 07/26/22 1637 07/27/22 0004 07/27/22 0549 07/27/22 1113  GLUCAP 111* 127* 117* 132* 143*   D-Dimer No results for input(s): "DDIMER" in the last 72 hours. Hgb A1c No results for input(s): "HGBA1C" in the last 72 hours. Lipid Profile Recent Labs    07/26/22 0636  TRIG 122   Thyroid function studies No results for input(s): "TSH", "T4TOTAL", "T3FREE", "THYROIDAB" in the last 72 hours.  Invalid input(s): "FREET3" Anemia work up No results for input(s): "VITAMINB12", "FOLATE", "FERRITIN", "TIBC", "IRON", "RETICCTPCT" in the last 72 hours. Urinalysis    Component Value Date/Time   COLORURINE AMBER (A) 07/21/2022 1016   APPEARANCEUR CLOUDY (A) 07/21/2022 1016   LABSPEC 1.029 07/21/2022 1016   PHURINE 5.0 07/21/2022 1016   GLUCOSEU NEGATIVE 07/21/2022 1016   HGBUR NEGATIVE 07/21/2022 1016  BILIRUBINUR SMALL (A) 07/21/2022 1016   KETONESUR 20 (A) 07/21/2022 1016   PROTEINUR 100 (A) 07/21/2022 1016   UROBILINOGEN 0.2 09/28/2007 0450   NITRITE NEGATIVE 07/21/2022 1016   LEUKOCYTESUR NEGATIVE 07/21/2022 1016   Sepsis Labs Recent Labs  Lab  07/24/22 0503 07/24/22 1744 07/26/22 0427 07/27/22 0353  WBC 10.5 10.1 7.6 7.4   Microbiology No results found for this or any previous visit (from the past 240 hour(s)).  Procedures/Studies: [place] EKG SITE RITE  Result Date: 07/24/2022 If Site Rite image not attached, placement could not be confirmed due to current cardiac rhythm.  CT ABDOMEN PELVIS W CONTRAST  Result Date: 07/21/2022 CLINICAL DATA:  Follow-up acute pancreatitis and splenic hematoma. Worsening abdominal pain for several days. Nausea and vomiting. EXAM: CT ABDOMEN AND PELVIS WITH CONTRAST TECHNIQUE: Multidetector CT imaging of the abdomen and pelvis was performed using the standard protocol following bolus administration of intravenous contrast. RADIATION DOSE REDUCTION: This exam was performed according to the departmental dose-optimization program which includes automated exposure control, adjustment of the mA and/or kV according to patient size and/or use of iterative reconstruction technique. CONTRAST:  142m OMNIPAQUE IOHEXOL 300 MG/ML  SOLN COMPARISON:  07/08/2022 FINDINGS: Lower Chest: Small left pleural effusion and left lower lobe atelectasis have decreased since prior study. Hepatobiliary: A 7 mm hypervascular lesion is seen in the posterior liver dome. This remains stable in retrospect compared to previous study, likely representing a flash-filling hemangioma. Gallbladder is unremarkable. No evidence of biliary ductal dilatation. Pancreas: Peripancreatic edema is again seen surrounding the pancreatic body and tail, consistent with acute pancreatitis. A 3.5 x 1.9 cm fluid collection is seen adjacent to the pancreatic tail, mildly increased from 2.2 x 1.9 cm previously. This is consistent with a pancreatic pseudocyst. No evidence of pancreatic ductal dilatation. Spleen: A large subcapsular hematoma is again seen along the lateral aspect of the spleen, which is slightly decreased in size. This currently measures 13.7 x 9.7  cm, compared to 14.3 x 10.5 cm previously. Adrenals/Urinary Tract: No suspicious masses identified. No evidence of ureteral calculi or hydronephrosis. Stomach/Bowel: No evidence of obstruction, inflammatory process or abnormal fluid collections. Normal appendix visualized. Vascular/Lymphatic: 12 mm lymph node in the porta hepatis remains stable, likely reactive in etiology. Thrombosis of the proximal splenic vein is again seen with venous collaterals in the left upper quadrant. Nonocclusive thrombus again seen throughout the portal veins, which is increased in the left portal vein since previous study. Reproductive:  No mass or other significant abnormality. Other:  None. Musculoskeletal:  No suspicious bone lesions identified. IMPRESSION: Moderate acute pancreatitis, without significant change. Mild increase in size of 3.5 cm pancreatic pseudocyst adjacent to the pancreatic tail. Mild decrease in size of large subcapsular splenic hematoma. 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 Nonocclusive thrombus throughout the portal veins, which is increased in the left portal vein since previous study. Decreased small left pleural effusion and left lower lobe atelectasis. Stable 7 mm hypervascular lesion in posterior liver dome, likely representing a flash-filling hemangioma. Recommend continued attention on follow-up imaging. Electronically Signed   By: JMarlaine HindM.D.   On: 07/21/2022 14:28   [place]dom[place] Complete  Result Date: 07/21/2022 CLINICAL DATA:  Abdominal pain. Known acute pancreatitis, splenic hematoma, and pancreatic pseudocyst. Technologist indicates limited study due to patient pain tolerance, bowel gas, and limited mobility. EXAM: ABDOMEN ULTRASOUND COMPLETE COMPARISON:  CT abdomen and pelvis July 08, 2022 FINDINGS: Gallbladder: Layering echogenic material  within the gallbladder, likely sludge. No gallstones or wall thickening visualized.  Positive sonographic Murphy sign noted by the sonographer, however limited assessment in the setting acute pancreatitis. Common bile duct: Diameter: 7 mm Liver: Increased and heterogeneous hepatic parenchymal echogenicity. There is also a more focal masslike echogenic area in the left hepatic lobe that measures 5.4 x 5.3 x 5.1 cm. Near-occlusive thrombus in the main portal vein. IVC: No abnormality visualized. Pancreas: Limited visualization secondary to overlying bowel gas. The known pancreatic pseudocyst is not well seen. Spleen: Heterogeneous perisplenic fluid collection that measures approximately 11.3 x 6.1 x 7.0 cm. Right Kidney: Length: 12.2 cm. Echogenicity within normal limits. No mass or hydronephrosis visualized. Left Kidney: Length: 11.9 cm. Echogenicity within normal limits. No mass or hydronephrosis visualized. Abdominal aorta: No aneurysm visualized. Other findings: None. IMPRESSION: 1. Large perisplenic hematoma, better assessed on recent CT. 2. Near-occlusive thrombus in the main portal vein. 3. There is a 5.4 cm focal masslike echogenic area in the left hepatic lobe, incompletely assessed on ultrasound. Differential considerations include differential hepatic perfusion in the setting of portal vein thrombus, hematoma, or focal mass. Recommend further assessment with liver MRI with and without contrast. 4. Gallbladder sludge without sonographic evidence of acute cholecystitis. The mildly dilated common bile duct and positive sonographic Murphy sign noted by the sonographer is favored to be related to known acute pancreatitis. Electronically Signed   By: Beryle Flock M.D.   On: 07/21/2022 12:32   CT ABDOMEN PELVIS W CONTRAST  Result Date: 07/08/2022 CLINICAL DATA:  Follow-up acute pancreatitis and splenic hematoma. EXAM: CT ABDOMEN AND PELVIS WITH CONTRAST TECHNIQUE: Multidetector CT imaging of the abdomen and pelvis was performed using the standard protocol following bolus administration of  intravenous contrast. RADIATION DOSE REDUCTION: This exam was performed according to the departmental dose-optimization program which includes automated exposure control, adjustment of the mA and/or kV according to patient size and/or use of iterative reconstruction technique. CONTRAST:  160m OMNIPAQUE IOHEXOL 300 MG/ML  SOLN COMPARISON:  06/30/2022 FINDINGS: Lower Chest: Moderate left pleural effusion and left lower lobe atelectasis show mild increase since previous study. Hepatobiliary: No hepatic masses identified. Nonocclusive thrombus is seen in the main and right portal veins. Gallbladder is unremarkable. No evidence of biliary ductal dilatation. Pancreas: Moderate acute pancreatitis shows no significant change. A 2.2 x 1.9 cm fluid collection in the splenic tail is also unchanged and consistent with a small pseudocyst. Spleen: Large subcapsular splenic hematoma measures 14.3 x 10.5 cm, without significant change in size compared to prior study. Splenic vein thrombosis also noted with venous collateral seen in the gastrosplenic ligament. Adrenals/Urinary Tract: No suspicious masses identified. No evidence of ureteral calculi or hydronephrosis. Stomach/Bowel: Nasogastric tube tip is seen in the distal gastric body. No evidence of obstruction, inflammatory process or abnormal fluid collections. Vascular/Lymphatic: No pathologically enlarged lymph nodes. Nonocclusive thrombus in the main and right portal veins and splenic vein thrombosis again noted, as described above. Reproductive:  No mass or other significant abnormality. Other:  None. Musculoskeletal:  No suspicious bone lesions identified. IMPRESSION: No significant change in moderate acute pancreatitis. Stable 2.2 cm pseudocyst in splenic tail. No significant change in large subcapsular splenic hematoma. Nonocclusive thrombus in the main and right portal veins. Splenic vein thrombosis. Increased moderate left pleural effusion and left lower lobe  atelectasis. Electronically Signed   By: JMarlaine HindM.D.   On: 07/08/2022 10:02   DG Chest Port 1 View  Result Date: 07/07/2022 CLINICAL DATA:  Assess  for nasogastric tube placement EXAM: PORTABLE CHEST 1 VIEW COMPARISON:  June 30, 2022 FINDINGS: The heart size and mediastinal contours are stable. Patchy consolidation of left lung base with left pleural effusion noted. Mild patchy consolidation of medial right lung base is noted. Nasogastric tube is identified with distal tip in the distal stomach. The visualized skeletal structures are stable. IMPRESSION: Nasogastric tube with distal tip in the distal stomach. Patchy consolidation of left lung base with left pleural effusion noted. Mild patchy consolidation of medial right lung base is noted. Electronically Signed   By: Abelardo Diesel M.D.   On: 07/07/2022 09:19   CT ABDOMEN PELVIS W CONTRAST  Result Date: 06/30/2022 CLINICAL DATA:  Abdominal pain, acute, nonlocalized diffuse pain, recent pancreatitis EXAM: CT ABDOMEN AND PELVIS WITH CONTRAST TECHNIQUE: Multidetector CT imaging of the abdomen and pelvis was performed using the standard protocol following bolus administration of intravenous contrast. RADIATION DOSE REDUCTION: This exam was performed according to the departmental dose-optimization program which includes automated exposure control, adjustment of the mA and/or kV according to patient size and/or use of iterative reconstruction technique. CONTRAST:  173m OMNIPAQUE IOHEXOL 300 MG/ML  SOLN COMPARISON:  None Available. FINDINGS: Lower chest: Trace bilateral, left greater than right, pleural effusion. Associated passive atelectasis of left lower lobe. Hepatobiliary: The liver is enlarged measuring up to 19 cm. No focal liver abnormality. No gallstones or definite gallbladder wall thickening. Positive for pericholecystic fluid. No biliary dilatation. Pancreas: No focal lesion. Hazy pancreatic contour with associated peripancreatic fat  stranding and free fluid. Slightly decreased in size cystic lesion measuring 2 x 2 cm along the distal pancreatic body. Poorly visualized distal pancreatic tail likely due to cystic changes and edema with underlying nonenhancement of the pancreatic tail not fully excluded. Interval resolution of cystic lesion along the greater curvature of the stomach. No main pancreatic ductal dilatation. Spleen: Similar-appearing heterogeneous 14 x 8 cm perisplenic subcapsular lesion likely representing a hematoma. Adrenals/Urinary Tract: No focal lesion. Normal pancreatic contour. No surrounding inflammatory changes. No main pancreatic ductal dilatation. Stomach/Bowel: Stomach is within normal limits. No evidence of bowel wall thickening or dilatation. Appendix appears normal. Vascular/Lymphatic: No splenic artery aneurysm identified. Venous collaterals noted. No abdominal aorta or iliac aneurysm. Mild atherosclerotic plaque of the aorta and its branches. No abdominal, pelvic, or inguinal lymphadenopathy. Reproductive: Uterus and bilateral adnexa are unremarkable. Other: Small volume simple free fluid ascites. No intraperitoneal free gas. No organized fluid collection. Musculoskeletal: No abdominal wall hernia or abnormality. Soft tissue density along the anterior abdominal wall likely due to medication injection. No suspicious lytic or blastic osseous lesions. No acute displaced fracture. Multilevel degenerative changes of the spine. IMPRESSION: 1. Acute pancreatitis with interval decrease in size of a distal pancreatic body cystic lesion likely representing a pseudocyst. Interval resolution of cystic lesion along the greater curvature of the stomach. Component of necrotizing pancreatitis along the pancreatic tail is not fully excluded. 2. Similar-appearing heterogeneous 14 x 8 cm perisplenic subcapsular hematoma. 3. Nonspecific pericholecystic fluid with no definite CT findings acute cholecystitis. 4. Small volume simple free  fluid ascites. 5. Trace bilateral, left greater than right, pleural effusion. 6. Mild hepatomegaly. 7. Venous collaterals. Electronically Signed   By: MIven FinnM.D.   On: 06/30/2022 22:45   DG Chest Port 1 View  Result Date: 06/30/2022 CLINICAL DATA:  Recent effusion with decreased lung sounds on the left EXAM: PORTABLE CHEST 1 VIEW COMPARISON:  Radiographs 06/13/2022 FINDINGS: Slight decrease in small left pleural effusion and  associated atelectasis since 06/13/2022. Possible trace right pleural effusion. No pneumothorax. Stable cardiomediastinal silhouette. No acute osseous abnormality. IMPRESSION: Since 06/13/2022, decreased small left pleural effusion and associated atelectasis. Pneumonia is difficult to exclude. Electronically Signed   By: Placido Sou M.D.   On: 06/30/2022 18:26     Time coordinating discharge: Over 30 minutes    Dwyane Dee, MD  [facility] 07/27/2022, 3:12 PM

## 2022-07-27 NOTE — Progress Notes (Signed)
Writer returned home medications from pharmacy to patient she is discharging home.

## 2022-07-27 NOTE — Progress Notes (Signed)
PHARMACY - TOTAL PARENTERAL NUTRITION CONSULT NOTE   Indication: intolerance to enteral feeding  Patient Measurements: Height: '5\' 3"'$  (160 cm) Weight: 69.9 kg (154 lb 1.6 oz) IBW/kg (Calculated) : 52.4 TPN AdjBW (KG): 56.2 Body mass index is 27.3 kg/m. Usual Weight:   Assessment:  GI recommending TPN as patient may not be candidate for NJ tube due to inflammation.  Glucose / Insulin: 111-132- 2 units given Electrolytes: Na 134   K 4.0 > 4.6   corrected calcium 9.8 Phos 4.7    mag 1.7 Renal: Scr < 0.3 Hepatic: WNL   Central access: PICC placed 12/20 TPN start date: 12/20  Nutritional Goals: Goal TPN rate is 80 mL/hr (provides 111 g of protein and 1808 kcals per day)  RD Assessment: Estimated Needs Total Energy Estimated Needs: 1800-2000 Total Protein Estimated Needs: 100-115g Total Fluid Estimated Needs: >/=1.8L  Current Nutrition:  Clearl liquids- Boost TID and prosource plus  Plan:  Continue TPN to 80 mL/hr at 1800 Electrolytes in TPN: Na 3mq/L, K 4370m/L, Ca 70m66mL, Mg 7 mEq/L, and Phos 0 mmol/L. Cl:Ac 1:1 Add standard MVI and trace elements to TPN  Sensitive q6h SSI and adjust as needed  Monitor TPN labs on Mon/Thurs,   SteMargot AblesharmD Clinical Pharmacist 07/27/2022 8:54 AM

## 2022-07-31 ENCOUNTER — Encounter (HOSPITAL_COMMUNITY): Payer: Self-pay

## 2022-07-31 ENCOUNTER — Ambulatory Visit (HOSPITAL_COMMUNITY)
Admission: RE | Admit: 2022-07-31 | Discharge: 2022-07-31 | Disposition: A | Discharge status: Home / Self Care | Payer: 59 | Source: Ambulatory Visit | Attending: Family Medicine | Admitting: Family Medicine

## 2022-07-31 ENCOUNTER — Telehealth: Payer: Self-pay | Admitting: Family Medicine

## 2022-07-31 DIAGNOSIS — I82890 Acute embolism and thrombosis of other specified veins: Secondary | ICD-10-CM

## 2022-07-31 DIAGNOSIS — D735 Infarction of spleen: Secondary | ICD-10-CM

## 2022-07-31 NOTE — Telephone Encounter (Signed)
Transition Care Management Unsuccessful Follow-up Telephone Call  Date of discharge and from where:  [facility]  Attempts:  1st Attempt  Reason for unsuccessful TCM follow-up call:  Left voice message

## 2022-08-03 ENCOUNTER — Other Ambulatory Visit: Payer: Self-pay

## 2022-08-03 ENCOUNTER — Encounter: Payer: Self-pay | Admitting: Family Medicine

## 2022-08-03 ENCOUNTER — Other Ambulatory Visit: Payer: Self-pay | Admitting: Family Medicine

## 2022-08-03 MED ORDER — ONDANSETRON HCL 4 MG PO TABS: 4.0000 mg | ORAL_TABLET | Freq: Four times a day (QID) | ORAL | 0 refills | Status: DC | PRN

## 2022-08-07 ENCOUNTER — Telehealth: Payer: Self-pay

## 2022-08-07 NOTE — Telephone Encounter (Signed)
Pam with home health left me a voicemail on 07/27/2022 and states patient was going to be discharge from [facility] on 07/28/2022 and was going to be discharge with home health TPN. She wanted [place] to make sure we knew and were going to follow up with her.   PAM call back number is [phone]

## 2022-08-08 ENCOUNTER — Other Ambulatory Visit: Payer: Self-pay | Admitting: Family Medicine

## 2022-08-08 MED ORDER — OXYCODONE HCL 10 MG PO TABS: 5.0000 mg | ORAL_TABLET | ORAL | 0 refills | Status: DC | PRN

## 2022-08-14 ENCOUNTER — Other Ambulatory Visit: Payer: Self-pay

## 2022-08-14 ENCOUNTER — Inpatient Hospital Stay (HOSPITAL_COMMUNITY)
Admission: EM | Admit: 2022-08-14 | Discharge: 2022-08-23 | DRG: 438 | Disposition: A | Discharge status: Home Health | Payer: 59 | Attending: Family Medicine | Admitting: Family Medicine

## 2022-08-14 DIAGNOSIS — Z8249 Family history of ischemic heart disease and other diseases of the circulatory system: Secondary | ICD-10-CM

## 2022-08-14 DIAGNOSIS — F17210 Nicotine dependence, cigarettes, uncomplicated: Secondary | ICD-10-CM | POA: Diagnosis present

## 2022-08-14 DIAGNOSIS — I82890 Acute embolism and thrombosis of other specified veins: Secondary | ICD-10-CM | POA: Diagnosis present

## 2022-08-14 DIAGNOSIS — R350 Frequency of micturition: Secondary | ICD-10-CM | POA: Diagnosis present

## 2022-08-14 DIAGNOSIS — E876 Hypokalemia: Secondary | ICD-10-CM | POA: Diagnosis present

## 2022-08-14 DIAGNOSIS — Z7984 Long term (current) use of oral hypoglycemic drugs: Secondary | ICD-10-CM

## 2022-08-14 DIAGNOSIS — R42 Dizziness and giddiness: Secondary | ICD-10-CM

## 2022-08-14 DIAGNOSIS — D649 Anemia, unspecified: Secondary | ICD-10-CM | POA: Diagnosis present

## 2022-08-14 DIAGNOSIS — K861 Other chronic pancreatitis: Secondary | ICD-10-CM | POA: Diagnosis present

## 2022-08-14 DIAGNOSIS — Z6827 Body mass index (BMI) 27.0-27.9, adult: Secondary | ICD-10-CM

## 2022-08-14 DIAGNOSIS — Z833 Family history of diabetes mellitus: Secondary | ICD-10-CM

## 2022-08-14 DIAGNOSIS — E669 Obesity, unspecified: Secondary | ICD-10-CM | POA: Diagnosis present

## 2022-08-14 DIAGNOSIS — I10 Essential (primary) hypertension: Secondary | ICD-10-CM | POA: Diagnosis present

## 2022-08-14 DIAGNOSIS — D735 Infarction of spleen: Secondary | ICD-10-CM | POA: Diagnosis present

## 2022-08-14 DIAGNOSIS — Z85828 Personal history of other malignant neoplasm of skin: Secondary | ICD-10-CM

## 2022-08-14 DIAGNOSIS — K859 Acute pancreatitis without necrosis or infection, unspecified: Principal | ICD-10-CM | POA: Diagnosis present

## 2022-08-14 DIAGNOSIS — Z808 Family history of malignant neoplasm of other organs or systems: Secondary | ICD-10-CM

## 2022-08-14 DIAGNOSIS — I81 Portal vein thrombosis: Secondary | ICD-10-CM | POA: Diagnosis present

## 2022-08-14 DIAGNOSIS — K863 Pseudocyst of pancreas: Secondary | ICD-10-CM | POA: Diagnosis present

## 2022-08-14 DIAGNOSIS — K8591 Acute pancreatitis with uninfected necrosis, unspecified: Secondary | ICD-10-CM | POA: Diagnosis not present

## 2022-08-14 DIAGNOSIS — Z86718 Personal history of other venous thrombosis and embolism: Secondary | ICD-10-CM

## 2022-08-14 DIAGNOSIS — R1013 Epigastric pain: Secondary | ICD-10-CM | POA: Diagnosis present

## 2022-08-14 DIAGNOSIS — K860 Alcohol-induced chronic pancreatitis: Secondary | ICD-10-CM

## 2022-08-14 DIAGNOSIS — Z79899 Other long term (current) drug therapy: Secondary | ICD-10-CM

## 2022-08-14 DIAGNOSIS — Z515 Encounter for palliative care: Secondary | ICD-10-CM

## 2022-08-14 MED ORDER — ONDANSETRON 8 MG PO TBDP
8.0000 mg | ORAL_TABLET | Freq: Once | ORAL | Status: AC
  Administered 2022-08-14: 8 mg via ORAL
  Filled 2022-08-14: qty 1

## 2022-08-14 MED ORDER — OXYCODONE-ACETAMINOPHEN 5-325 MG PO TABS
1.0000 | ORAL_TABLET | Freq: Once | ORAL | Status: AC
  Administered 2022-08-14: 1 via ORAL
  Filled 2022-08-14: qty 1

## 2022-08-14 NOTE — ED Triage Notes (Signed)
Pt with chronic pancreatitis.  Started having pain yesterday and is worse today and got to the point she could not stand it.  Pt admission in December .  Receiving TPN through PICC in right arm since that admission

## 2022-08-14 NOTE — ED Provider Triage Note (Signed)
Emergency Medicine Provider Triage Evaluation Note  Jillian Shepherd , a 37 y.o. female  was evaluated in triage.  Pt complains of abdominal pain, nausea, vomiting.  Patient with history of chronic pancreatitis secondary to alcohol ingestion.  Recently discharged on 07/27/2022 with PICC line receiving TPN at home.  Patient states that pain is worsened over the past week with acute worsening over the past 2 days.  States the pain is in epigastric region with radiation to her back and feels similar to other pancreatitis flareups.  Reports nausea with episodes of emesis at home.  States she is not consumed any alcohol and has been eating minimally by mouth with Jell-O and chicken noodle soup occasionally.  Has been alternating Tylenol and ibuprofen at home which has been helping minimally.  Denies fever, chills, night sweats, chest pain, shortness of breath, urinary symptoms, change in bowel habits.  Review of Systems  Positive: See above Negative:   Physical Exam  BP (!) 140/101 (BP Location: Left Arm)   Pulse (!) 108   Temp 98.2 F (36.8 C) (Oral)   Resp 18   LMP 07/01/2022 (Approximate)   SpO2 100%  Gen:   Awake, no distress   Resp:  Normal effort  MSK:   Moves extremities without difficulty  Other:  Epigastric tenderness to palpation  Medical Decision Making  Medically screening exam initiated at 8:17 PM.  Appropriate orders placed.  Taylour Lietzke was informed that the remainder of the evaluation will be completed by another provider, this initial triage assessment does not replace that evaluation, and the importance of remaining in the ED until their evaluation is complete.     Wilnette Kales, [place] 08/14/22 2018

## 2022-08-15 ENCOUNTER — Emergency Department (HOSPITAL_COMMUNITY): Payer: 59

## 2022-08-15 DIAGNOSIS — R350 Frequency of micturition: Secondary | ICD-10-CM | POA: Diagnosis present

## 2022-08-15 DIAGNOSIS — K859 Acute pancreatitis without necrosis or infection, unspecified: Secondary | ICD-10-CM | POA: Diagnosis not present

## 2022-08-15 DIAGNOSIS — F17210 Nicotine dependence, cigarettes, uncomplicated: Secondary | ICD-10-CM | POA: Diagnosis present

## 2022-08-15 DIAGNOSIS — Z808 Family history of malignant neoplasm of other organs or systems: Secondary | ICD-10-CM | POA: Diagnosis not present

## 2022-08-15 DIAGNOSIS — Z85828 Personal history of other malignant neoplasm of skin: Secondary | ICD-10-CM | POA: Diagnosis not present

## 2022-08-15 DIAGNOSIS — I81 Portal vein thrombosis: Secondary | ICD-10-CM | POA: Diagnosis present

## 2022-08-15 DIAGNOSIS — E669 Obesity, unspecified: Secondary | ICD-10-CM | POA: Diagnosis present

## 2022-08-15 DIAGNOSIS — K863 Pseudocyst of pancreas: Secondary | ICD-10-CM | POA: Diagnosis present

## 2022-08-15 DIAGNOSIS — K8591 Acute pancreatitis with uninfected necrosis, unspecified: Secondary | ICD-10-CM | POA: Diagnosis present

## 2022-08-15 DIAGNOSIS — Z515 Encounter for palliative care: Secondary | ICD-10-CM | POA: Diagnosis not present

## 2022-08-15 DIAGNOSIS — K861 Other chronic pancreatitis: Secondary | ICD-10-CM | POA: Diagnosis present

## 2022-08-15 DIAGNOSIS — I10 Essential (primary) hypertension: Secondary | ICD-10-CM | POA: Diagnosis present

## 2022-08-15 DIAGNOSIS — Z6827 Body mass index (BMI) 27.0-27.9, adult: Secondary | ICD-10-CM | POA: Diagnosis not present

## 2022-08-15 DIAGNOSIS — Z833 Family history of diabetes mellitus: Secondary | ICD-10-CM | POA: Diagnosis not present

## 2022-08-15 DIAGNOSIS — Z7984 Long term (current) use of oral hypoglycemic drugs: Secondary | ICD-10-CM | POA: Diagnosis not present

## 2022-08-15 DIAGNOSIS — R1013 Epigastric pain: Secondary | ICD-10-CM | POA: Diagnosis not present

## 2022-08-15 DIAGNOSIS — D735 Infarction of spleen: Secondary | ICD-10-CM | POA: Diagnosis present

## 2022-08-15 DIAGNOSIS — R42 Dizziness and giddiness: Secondary | ICD-10-CM | POA: Diagnosis present

## 2022-08-15 DIAGNOSIS — I82890 Acute embolism and thrombosis of other specified veins: Secondary | ICD-10-CM | POA: Diagnosis present

## 2022-08-15 DIAGNOSIS — Z86718 Personal history of other venous thrombosis and embolism: Secondary | ICD-10-CM | POA: Diagnosis not present

## 2022-08-15 DIAGNOSIS — Z79899 Other long term (current) drug therapy: Secondary | ICD-10-CM | POA: Diagnosis not present

## 2022-08-15 DIAGNOSIS — Z8249 Family history of ischemic heart disease and other diseases of the circulatory system: Secondary | ICD-10-CM | POA: Diagnosis not present

## 2022-08-15 DIAGNOSIS — E876 Hypokalemia: Secondary | ICD-10-CM | POA: Diagnosis present

## 2022-08-15 DIAGNOSIS — D649 Anemia, unspecified: Secondary | ICD-10-CM | POA: Diagnosis present

## 2022-08-15 LAB — URINALYSIS, ROUTINE W REFLEX MICROSCOPIC
Bacteria, UA: NONE SEEN
Bilirubin Urine: NEGATIVE
Glucose, UA: NEGATIVE mg/dL
Hgb urine dipstick: NEGATIVE
Ketones, ur: 80 mg/dL — AB
Leukocytes,Ua: NEGATIVE
Nitrite: NEGATIVE
Protein, ur: 30 mg/dL — AB
Specific Gravity, Urine: 1.03 (ref 1.005–1.030)
pH: 5 (ref 5.0–8.0)

## 2022-08-15 LAB — CBC
HCT: 36.7 % (ref 36.0–46.0)
Hemoglobin: 12 g/dL (ref 12.0–15.0)
MCH: 27.8 pg (ref 26.0–34.0)
MCHC: 32.7 g/dL (ref 30.0–36.0)
MCV: 85.2 fL (ref 80.0–100.0)
Platelets: 567 10*3/uL — ABNORMAL HIGH (ref 150–400)
RBC: 4.31 MIL/uL (ref 3.87–5.11)
RDW: 17.2 % — ABNORMAL HIGH (ref 11.5–15.5)
WBC: 10.1 10*3/uL (ref 4.0–10.5)
nRBC: 0 % (ref 0.0–0.2)

## 2022-08-15 LAB — MAGNESIUM: Magnesium: 1.7 mg/dL (ref 1.7–2.4)

## 2022-08-15 LAB — VITAMIN B12: Vitamin B-12: 186 pg/mL (ref 180–914)

## 2022-08-15 LAB — COMPREHENSIVE METABOLIC PANEL
ALT: 12 U/L (ref 0–44)
AST: 18 U/L (ref 15–41)
Albumin: 2.8 g/dL — ABNORMAL LOW (ref 3.5–5.0)
Alkaline Phosphatase: 92 U/L (ref 38–126)
Anion gap: 11 (ref 5–15)
BUN: 10 mg/dL (ref 6–20)
CO2: 24 mmol/L (ref 22–32)
Calcium: 8.8 mg/dL — ABNORMAL LOW (ref 8.9–10.3)
Chloride: 98 mmol/L (ref 98–111)
Creatinine, Ser: 0.45 mg/dL (ref 0.44–1.00)
GFR, Estimated: >60 mL/min (ref >60)
Glucose, Bld: 99 mg/dL (ref 70–99)
Potassium: 3.2 mmol/L — ABNORMAL LOW (ref 3.5–5.1)
Sodium: 133 mmol/L — ABNORMAL LOW (ref 135–145)
Total Bilirubin: 0.9 mg/dL (ref 0.3–1.2)
Total Protein: 7.9 g/dL (ref 6.5–8.1)

## 2022-08-15 LAB — LIPASE, BLOOD: Lipase: 115 U/L — ABNORMAL HIGH (ref 11–51)

## 2022-08-15 LAB — POC URINE PREG, ED: Preg Test, Ur: NEGATIVE

## 2022-08-15 MED ORDER — POLYETHYLENE GLYCOL 3350 17 G PO PACK: 17.0000 g | PACK | Freq: Every day | ORAL | Status: DC | PRN

## 2022-08-15 MED ORDER — POTASSIUM CHLORIDE IN NACL 20-0.9 MEQ/L-% IV SOLN
INTRAVENOUS | Status: DC
  Filled 2022-08-15 (×2): qty 1000

## 2022-08-15 MED ORDER — CHLORHEXIDINE GLUCONATE CLOTH 2 % EX PADS
6.0000 | MEDICATED_PAD | Freq: Every day | CUTANEOUS | Status: DC
  Administered 2022-08-16 – 2022-08-23 (×8): 6 via TOPICAL

## 2022-08-15 MED ORDER — HYDROMORPHONE HCL 1 MG/ML IJ SOLN
1.0000 mg | INTRAMUSCULAR | Status: DC | PRN
  Administered 2022-08-15: 1 mg via INTRAVENOUS
  Filled 2022-08-15: qty 1

## 2022-08-15 MED ORDER — SODIUM CHLORIDE 0.9 % IV BOLUS
1000.0000 mL | Freq: Once | INTRAVENOUS | Status: AC
  Administered 2022-08-15: 1000 mL via INTRAVENOUS

## 2022-08-15 MED ORDER — SODIUM CHLORIDE 0.9% FLUSH
3.0000 mL | Freq: Two times a day (BID) | INTRAVENOUS | Status: DC
  Administered 2022-08-16 (×2): 3 mL via INTRAVENOUS

## 2022-08-15 MED ORDER — THIAMINE MONONITRATE 100 MG PO TABS
100.0000 mg | ORAL_TABLET | Freq: Every day | ORAL | Status: DC
  Administered 2022-08-15 – 2022-08-23 (×9): 100 mg via ORAL
  Filled 2022-08-15 (×9): qty 1

## 2022-08-15 MED ORDER — HYDROMORPHONE HCL 1 MG/ML IJ SOLN
1.0000 mg | INTRAMUSCULAR | Status: DC | PRN
  Administered 2022-08-15 – 2022-08-19 (×43): 1 mg via INTRAVENOUS
  Filled 2022-08-15 (×46): qty 1

## 2022-08-15 MED ORDER — TRAZODONE HCL 50 MG PO TABS
50.0000 mg | ORAL_TABLET | Freq: Every evening | ORAL | Status: DC | PRN
  Administered 2022-08-17: 50 mg via ORAL
  Filled 2022-08-15: qty 1

## 2022-08-15 MED ORDER — SODIUM CHLORIDE 0.9 % IV SOLN
2.0000 g | INTRAVENOUS | Status: DC
  Administered 2022-08-15 – 2022-08-19 (×5): 2 g via INTRAVENOUS
  Filled 2022-08-15 (×4): qty 20

## 2022-08-15 MED ORDER — HEPARIN SODIUM (PORCINE) 5000 UNIT/ML IJ SOLN
5000.0000 [IU] | Freq: Three times a day (TID) | INTRAMUSCULAR | Status: DC
  Filled 2022-08-15 (×3): qty 1

## 2022-08-15 MED ORDER — OXYCODONE HCL 5 MG PO TABS
5.0000 mg | ORAL_TABLET | ORAL | Status: DC | PRN
  Administered 2022-08-15 – 2022-08-20 (×6): 5 mg via ORAL
  Filled 2022-08-15 (×6): qty 1

## 2022-08-15 MED ORDER — SODIUM CHLORIDE 0.9% FLUSH
3.0000 mL | INTRAVENOUS | Status: DC | PRN
  Administered 2022-08-16: 3 mL via INTRAVENOUS

## 2022-08-15 MED ORDER — ONDANSETRON HCL 4 MG PO TABS: 4.0000 mg | ORAL_TABLET | Freq: Four times a day (QID) | ORAL | Status: DC | PRN

## 2022-08-15 MED ORDER — IOHEXOL 300 MG/ML  SOLN
100.0000 mL | Freq: Once | INTRAMUSCULAR | Status: AC | PRN
  Administered 2022-08-15: 100 mL via INTRAVENOUS

## 2022-08-15 MED ORDER — METRONIDAZOLE 500 MG/100ML IV SOLN: 500.0000 mg | Freq: Two times a day (BID) | INTRAVENOUS | Status: DC

## 2022-08-15 MED ORDER — ORAL CARE MOUTH RINSE: 15.0000 mL | OROMUCOSAL | Status: DC | PRN

## 2022-08-15 MED ORDER — MECLIZINE HCL 12.5 MG PO TABS: 25.0000 mg | ORAL_TABLET | Freq: Three times a day (TID) | ORAL | Status: DC | PRN

## 2022-08-15 MED ORDER — SODIUM CHLORIDE 0.9% FLUSH: 3.0000 mL | INTRAVENOUS | Status: DC | PRN

## 2022-08-15 MED ORDER — ONDANSETRON HCL 4 MG/2ML IJ SOLN
4.0000 mg | Freq: Once | INTRAMUSCULAR | Status: AC
  Administered 2022-08-15: 4 mg via INTRAVENOUS
  Filled 2022-08-15: qty 2

## 2022-08-15 MED ORDER — POLYETHYLENE GLYCOL 3350 17 G PO PACK
17.0000 g | PACK | Freq: Every day | ORAL | Status: DC | PRN
  Administered 2022-08-16: 17 g via ORAL
  Filled 2022-08-15: qty 1

## 2022-08-15 MED ORDER — KETOROLAC TROMETHAMINE 15 MG/ML IJ SOLN
15.0000 mg | Freq: Three times a day (TID) | INTRAMUSCULAR | Status: AC
  Administered 2022-08-15 – 2022-08-16 (×6): 15 mg via INTRAVENOUS
  Filled 2022-08-15 (×6): qty 1

## 2022-08-15 MED ORDER — BISACODYL 10 MG RE SUPP: 10.0000 mg | Freq: Every day | RECTAL | Status: DC | PRN

## 2022-08-15 MED ORDER — FOLIC ACID 1 MG PO TABS
1.0000 mg | ORAL_TABLET | Freq: Every day | ORAL | Status: DC
  Administered 2022-08-15 – 2022-08-23 (×9): 1 mg via ORAL
  Filled 2022-08-15 (×9): qty 1

## 2022-08-15 MED ORDER — HYDROMORPHONE HCL 1 MG/ML IJ SOLN
1.0000 mg | Freq: Once | INTRAMUSCULAR | Status: AC
  Administered 2022-08-15: 1 mg via INTRAVENOUS
  Filled 2022-08-15: qty 1

## 2022-08-15 MED ORDER — ACETAMINOPHEN 325 MG PO TABS: 650.0000 mg | ORAL_TABLET | Freq: Four times a day (QID) | ORAL | Status: DC | PRN

## 2022-08-15 MED ORDER — SODIUM CHLORIDE 0.9 % IV SOLN: INTRAVENOUS | Status: DC | PRN

## 2022-08-15 MED ORDER — SODIUM CHLORIDE 0.9% FLUSH
3.0000 mL | Freq: Two times a day (BID) | INTRAVENOUS | Status: DC
  Administered 2022-08-16 – 2022-08-23 (×10): 3 mL via INTRAVENOUS

## 2022-08-15 MED ORDER — ACETAMINOPHEN 650 MG RE SUPP: 650.0000 mg | Freq: Four times a day (QID) | RECTAL | Status: DC | PRN

## 2022-08-15 MED ORDER — SODIUM CHLORIDE 0.9 % IV SOLN
2.0000 g | INTRAVENOUS | Status: DC
  Filled 2022-08-15: qty 20

## 2022-08-15 MED ORDER — PANTOPRAZOLE SODIUM 40 MG PO TBEC
40.0000 mg | DELAYED_RELEASE_TABLET | Freq: Every day | ORAL | Status: DC
  Administered 2022-08-15 – 2022-08-23 (×9): 40 mg via ORAL
  Filled 2022-08-15 (×9): qty 1

## 2022-08-15 MED ORDER — HEPARIN SODIUM (PORCINE) 5000 UNIT/ML IJ SOLN: 5000.0000 [IU] | Freq: Three times a day (TID) | INTRAMUSCULAR | Status: DC

## 2022-08-15 MED ORDER — SODIUM CHLORIDE 0.9% FLUSH
3.0000 mL | Freq: Two times a day (BID) | INTRAVENOUS | Status: DC
  Administered 2022-08-15 – 2022-08-16 (×4): 3 mL via INTRAVENOUS

## 2022-08-15 MED ORDER — ACETAMINOPHEN 325 MG PO TABS
650.0000 mg | ORAL_TABLET | Freq: Four times a day (QID) | ORAL | Status: DC | PRN
  Administered 2022-08-19 – 2022-08-20 (×2): 650 mg via ORAL
  Filled 2022-08-15 (×2): qty 2

## 2022-08-15 MED ORDER — ONDANSETRON HCL 4 MG/2ML IJ SOLN
4.0000 mg | Freq: Four times a day (QID) | INTRAMUSCULAR | Status: DC | PRN
  Administered 2022-08-15 – 2022-08-23 (×13): 4 mg via INTRAVENOUS
  Filled 2022-08-15 (×13): qty 2

## 2022-08-15 MED ORDER — TRAZODONE HCL 50 MG PO TABS: 50.0000 mg | ORAL_TABLET | Freq: Every evening | ORAL | Status: DC | PRN

## 2022-08-15 MED ORDER — SODIUM CHLORIDE 0.9% FLUSH
3.0000 mL | Freq: Two times a day (BID) | INTRAVENOUS | Status: DC
  Administered 2022-08-15 – 2022-08-21 (×9): 3 mL via INTRAVENOUS

## 2022-08-15 MED ORDER — METRONIDAZOLE 500 MG/100ML IV SOLN
500.0000 mg | Freq: Two times a day (BID) | INTRAVENOUS | Status: DC
  Administered 2022-08-15 – 2022-08-19 (×10): 500 mg via INTRAVENOUS
  Filled 2022-08-15 (×10): qty 100

## 2022-08-15 MED ORDER — ONDANSETRON HCL 4 MG/2ML IJ SOLN: 4.0000 mg | Freq: Four times a day (QID) | INTRAMUSCULAR | Status: DC | PRN

## 2022-08-15 MED ORDER — AMLODIPINE BESYLATE 5 MG PO TABS
10.0000 mg | ORAL_TABLET | Freq: Every day | ORAL | Status: DC
  Administered 2022-08-15 – 2022-08-23 (×3): 10 mg via ORAL
  Filled 2022-08-15 (×8): qty 2

## 2022-08-15 MED ORDER — PANCRELIPASE (LIP-PROT-AMYL) 12000-38000 UNITS PO CPEP
36000.0000 [IU] | ORAL_CAPSULE | Freq: Three times a day (TID) | ORAL | Status: DC
  Administered 2022-08-15 – 2022-08-23 (×24): 36000 [IU] via ORAL
  Filled 2022-08-15 (×3): qty 3
  Filled 2022-08-15: qty 1
  Filled 2022-08-15 (×20): qty 3

## 2022-08-15 MED ORDER — ADULT MULTIVITAMIN W/MINERALS CH
1.0000 | ORAL_TABLET | Freq: Every day | ORAL | Status: DC
  Administered 2022-08-15 – 2022-08-23 (×9): 1 via ORAL
  Filled 2022-08-15 (×10): qty 1

## 2022-08-15 MED ORDER — PIPERACILLIN-TAZOBACTAM 3.375 G IVPB
3.3750 g | Freq: Once | INTRAVENOUS | Status: DC
  Administered 2022-08-15: 3.375 g via INTRAVENOUS
  Filled 2022-08-15: qty 50

## 2022-08-15 MED ORDER — POTASSIUM CHLORIDE CRYS ER 20 MEQ PO TBCR
40.0000 meq | EXTENDED_RELEASE_TABLET | ORAL | Status: AC
  Administered 2022-08-15 (×2): 40 meq via ORAL
  Filled 2022-08-15 (×2): qty 2

## 2022-08-15 NOTE — H&P (Signed)
Patient Demographics:    Jillian Shepherd, is a 37 y.o. female  MRN: [id number]   DOB - Jun 12, 1986  Admit Date - 08/14/2022  Outpatient Primary MD for the patient is [name], MD   Assessment & Plan:   Assessment and Plan:  1)Acute on chronic pancreatitis Pseudocyst formation -Repeat CT abdomen and pelvis from 08/23/2022 shows  Unresolved pancreatic inflammation, with small but enlarging pseudocysts at both the porta hepatis and pancreatic tail with Superimposed necrosis of the distal tail. Mildly increased from last month dependent fluid and inflammation in the abdominal gutters and pelvis. --Also has mild increase of 3.5 cm pancreatic pseudocyst adjacent to pancreatic tail -GI following, appreciate assistance.  Patient has been unable to tolerate adequate oral nutrition and was recommended for initiation of TPN - PICC placed on 07/25/2022 and TPN has been started with plans for continuing TPN at home and slow diet advancement -Discussed with Dr. Rush Landmark from GI service who states pseudocyst in the pancreas tail/necrosis or possible necrosis in the tail is not something that would require absolute intervention at this point - will need to follow up with her outpatient GI, Dr. 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 And diminutive but patent main portal vein, splenic vein, SMV. -As per Dr.  MRush Landmarklarge splenic hematoma. In most cases these will decrease in size over time, but when they do not sometimes evacuation versus drainage may need to be considered (if there is any concern of infection, may need to think about aspiration of that). --Hematoma evacuation is a high risk procedure and I am not sure if the surgeons would do that unless it was increasing in size or other issues.  - no anticoagulation as per GI due to hematoma   3) biliary concerns--- CT abdomen and pelvis on 08/15/2021 shows Progressed, Moderate to Severe Hepatic Biliary Ductal Dilatation since last month. And superimposed ductal wall enhancement raises the possibility of Cholangitis. -LFTs are normal show cholangitis less likely monitor LFTs for now - Thus I do think that for now TPN remains the main steadfast for her and if she could tolerate trickle feeds in the distal duodenum via [place] that may make sense for her in an effort of trying to help things heal. But for now not sure there is much more that we would be able to do an endoscopically there is not something we would necessarily pursue right now. If the LFTs started to increase significantly and we believe that cyst was causing that, then an aspiration (not a cystgastrostomy or cystenterostomy) would be the first step  where I do think EUS could be helpful. But again LFTs are normal. Not sure if that is help things or clouded the picture even more.  -Rocephin and Flagyl as ordered pending further data   4)Hypokalemia -Replaced potassium, magnesium 1.7  5)Essential hypertension -Continue amlodipine   6)Liddle's syndrome -Monitored BP and electrolytes  Status is: Inpatient  Remains inpatient appropriate because:   Dispo: The patient is from: Home              Anticipated d/c is to: Home              Anticipated d/c date is: > 3 days              Patient currently is not medically stable to d/c. Barriers: Not Clinically Stable-    With History  of - Reviewed by me  Past Medical History:  Diagnosis Date   Abdominal pain    Alcohol abuse    B12 deficiency    Cancer (HCC)    squmaous cell skin cancer    Collagen vascular disease (HCC)    Hypokalemia    Liddle's syndrome    Obesity (BMI 35.0-39.9 without comorbidity)    Pancreatitis    Pleural effusion    Splenic hemorrhage       Past Surgical History:  Procedure Laterality Date   ADENOIDECTOMY     CHEST TUBE INSERTION     COLONOSCOPY WITH PROPOFOL N/A 10/06/2020   Procedure: COLONOSCOPY WITH PROPOFOL;  Surgeon: Lin Landsman, MD;  Location: [facility];  Service: Gastroenterology;  Laterality: N/A;  COVID POSITIVE 08/23/2020   ESOPHAGOGASTRODUODENOSCOPY (EGD) WITH PROPOFOL N/A 10/06/2020   Procedure: ESOPHAGOGASTRODUODENOSCOPY (EGD) WITH PROPOFOL;  Surgeon: Lin Landsman, MD;  Location: [place];  Service: Gastroenterology;  Laterality: N/A;   skin cancer removal Left    leg   TONSILLECTOMY      Chief Complaint  Patient presents with   Abdominal Pain      HPI:    Jillian Shepherd  is a 37 y.o. female  with PMH Liddle syndrome, obesity, alcoholic pancreatitis/chronic pancreatitis with pseudocyst formation with recurrent admissions for same -No EtOH intake October 2023 She has a known subcapsular perisplenic hematoma and on prior hospitalization she was also found to have a nonocclusive thrombus involving the main and right portal veins and splenic vein thrombosis on CT on 07/08/22.  She is not considered anticoagulation candidate per GI due to perisplenic hematoma.  Due to persistent abdominal pain and inability to tolerate oral nutrition, she was recommended for initiating on TPN.  PICC line was placed on 07/25/2022. -She returns to the ED today with abdominal pain and nausea - CT abdomen and pelvis shows:-  Unresolved pancreatic inflammation, with small but enlarging pseudocysts at both the porta hepatis (see also #2 - series 2, image 28) and  pancreatic tail (image 34). Superimposed necrosis of the distal tail. Mildly increased from last month dependent fluid and inflammation in the abdominal gutters and pelvis.   2. Progressed, Moderate to Severe Hepatic Biliary Ductal Dilatation since last month. And superimposed ductal wall enhancement raises the possibility of Cholangitis.   3. Large Splenic Subcapsular Hematoma and/or complex collection (538 mL) not significantly changed. Mass effect on the spleen.   4. Organized left pleural collection with pleural thickening at the lung base has regressed but not resolved. Associated left lower lobe atelectasis.   5. 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 And diminutive but patent main portal vein, splenic vein,  SMV. - -UA does not suggest infection -Potassium 3.2 sodium 133 -Creatinine 0.45 -LFTs not elevated - -Conference with Dr. Gala Romney [facility]) as well as Dr. Rush Landmark and  Dr. Lyndel Safe from [facility]--recommendations as outlined in the assessment and plan section     Review of systems:    In addition to the HPI above,   A full Review of  Systems was done, all other systems reviewed are negative except as noted above in HPI , .    Social History:  Reviewed by me    Social History   Tobacco Use   Smoking status: Every Day    Packs/day: 0.25    Years: 9.00    Total pack years: 2.25    Types: Cigarettes   Smokeless tobacco: Never  Substance Use Topics   Alcohol use: Yes    Alcohol/week: 2.0 standard drinks of alcohol    Types: 2 Cans of beer per week    Family History :  Reviewed by me    Family History  Problem Relation Age of Onset   Hypertension Father    Anxiety disorder Father    Depression Father    Melanoma Paternal Grandmother        of skin   Diabetes Paternal Grandmother    Cirrhosis Paternal Uncle     Home Medications:   Prior to Admission medications   Medication Sig Start Date End Date Taking? Authorizing Provider   amLODipine (NORVASC) 10 MG tablet Take 1 tablet (10 mg total) by mouth daily. Patient taking differently: Take 10 mg by mouth in the morning. 04/10/22  Yes Howard, Amber S, FNP  cyanocobalamin (VITAMIN B12) 1000 MCG/ML injection Inject 1 mL (1,000 mcg total) into the muscle every 30 (thirty) days. 05/17/22  Yes [name], MD  folic acid (FOLVITE) 1 MG tablet Take 1 tablet (1 mg total) by mouth daily. Patient taking differently: Take 1 mg by mouth in the morning. 05/17/22 05/12/23 Yes [name], MD  lipase/protease/amylase (CREON) 36000 UNITS CPEP capsule Take 2 capsules (72,000 Units total) by mouth 3 (three) times daily with meals. May also take 1 capsule (36,000 Units total) as needed (with snacks). 07/20/22  Yes Rubie Maid, FNP  metFORMIN (GLUCOPHAGE) 500 MG tablet Take 1 tablet (500 mg total) by mouth 2 (two) times daily with a meal. 05/18/22  Yes Pickard, Cammie Mcgee, MD  ondansetron (ZOFRAN) 4 MG tablet Take 1 tablet (4 mg total) by mouth every 6 (six) hours as needed for nausea or vomiting. 08/03/22 09/02/22 Yes [name], MD  Oxycodone HCl 10 MG TABS Take 0.5-1 tablets (5-10 mg total) by mouth every 4 (four) hours as needed. 08/08/22  Yes [name], MD  pantoprazole (PROTONIX) 40 MG tablet Take 1 tablet (40 mg total) by mouth daily. 07/13/22 08/12/22  Deatra James, MD     Allergies:    No Known Allergies   Physical Exam:   Vitals  Blood pressure 125/74, pulse 83, temperature 97.6 F (36.4 C), temperature source Oral, resp. rate 20, last menstrual period 07/01/2022, SpO2 92 %.  Physical Examination: General appearance - alert,  in no distress  Mental status - alert, oriented to person, place, and time,  Eyes - sclera anicteric Neck - supple, no JVD elevation , Chest - clear  to auscultation bilaterally, symmetrical air movement,  Heart - S1 and S2 normal, regular  Abdomen - soft, nondistended, +BS, right upper quadrant periumbilical and left upper  quadrant tenderness without  rebound or guarding Neuro - screening mental status exam normal, neck supple without rigidity, cranial nerves II through XII intact, DTR's normal and symmetric Extremities - no pedal edema noted, intact peripheral pulses  Skin - warm, dry     Data Review:    CBC Recent Labs  Lab 08/15/22 0040  WBC 10.1  HGB 12.0  HCT 36.7  PLT 567*  MCV 85.2  MCH 27.8  MCHC 32.7  RDW 17.2*   ------------------------------------------------------------------------------------------------------------------  Chemistries  Recent Labs  Lab 08/15/22 0040 08/15/22 0045  NA 133*  --   K 3.2*  --   CL 98  --   CO2 24  --   GLUCOSE 99  --   BUN 10  --   CREATININE 0.45  --   CALCIUM 8.8*  --   MG  --  1.7  AST 18  --   ALT 12  --   ALKPHOS 92  --   BILITOT 0.9  --    ------------------------------------------------------------------------------------------------------------------    Component Value Date/Time   BNP 42.0 05/12/2022 0730   Urinalysis    Component Value Date/Time   COLORURINE AMBER (A) 08/15/2022 0400   APPEARANCEUR HAZY (A) 08/15/2022 0400   LABSPEC 1.030 08/15/2022 0400   PHURINE 5.0 08/15/2022 0400   GLUCOSEU NEGATIVE 08/15/2022 0400   HGBUR NEGATIVE 08/15/2022 0400   BILIRUBINUR NEGATIVE 08/15/2022 0400   KETONESUR 80 (A) 08/15/2022 0400   PROTEINUR 30 (A) 08/15/2022 0400   UROBILINOGEN 0.2 09/28/2007 0450   NITRITE NEGATIVE 08/15/2022 0400   LEUKOCYTESUR NEGATIVE 08/15/2022 0400    ----------------------------------------------------------------------------------------------------------------   Imaging Results:    CT ABDOMEN PELVIS W CONTRAST  Result Date: 08/15/2022 CLINICAL DATA:  37 year old female with abdominal pain. History of pancreatitis, subcapsular splenic hematoma. EXAM: CT ABDOMEN AND PELVIS WITH CONTRAST TECHNIQUE: Multidetector CT imaging of the abdomen and pelvis was performed using the standard protocol  following bolus administration of intravenous contrast. RADIATION DOSE REDUCTION: This exam was performed according to the departmental dose-optimization program which includes automated exposure control, adjustment of the mA and/or kV according to patient size and/or use of iterative reconstruction technique. CONTRAST:  167m OMNIPAQUE IOHEXOL 300 MG/ML  SOLN COMPARISON:  CT Abdomen and Pelvis 07/21/2022 and earlier. FINDINGS: Lower chest: Decreased but not resolved left pleural effusion with associated pleural thickening and enhancement compatible with organized pleural collection (less likely empyema given interval decrease). Associated round atelectasis in the left lower lobe. No pericardial effusion. Partially visible SVC vascular catheter. Mild right lung base atelectasis. No right pleural effusion. Hepatobiliary: Heterogeneous liver perfusion and worsening intrahepatic biliary ductal dilatation with increased bile duct wall enhancement and central intrahepatic bile ducts are up to 10 mm diameter now (series 2, image 25). Gallbladder also slightly more distended. And there is a small but increasing porta hepatis fluid collection which is probably a pseudo cyst measuring 19 x 27 x 26 mm now, 2 cm or smaller last month. CBD appears to taper distally. Pancreas: Unresolved peripancreatic inflammation with partial pancreatic atrophy. Organizing but irregular 2.5 to 2.8 cm pseudocyst of the pancreatic tail on series 2, image 34 and coronal image 43. Superimposed pancreatic necrosis of the distal tail (series 2, image 34). Peripancreatic inflammation. Spleen: Large subcapsular splenic fluid collection with mixed density previously thought to be subcapsular hematoma. This is 87 x 125 by 99 mm (AP by transverse by CC) volume estimated at 500 38 mL. Size not significantly changed from last month. Mass effect on the more homogeneously enhancing  splenic parenchyma as before. Adrenals/Urinary Tract: Despite regional  inflammation, adrenal glands and kidneys remain within normal limits. No delayed renal images. Decompressed urinary bladder. Stomach/Bowel: No dilated large or small bowel loops. Increased free fluid and/or inflammatory stranding in both pericolic gutters since last month. No free air. Mass effect on the stomach secondary to abnormal spleen and other upper abdominal viscera. And small volume of fluid within the stomach. No discrete gastric fluid collection. Duodenum may be mildly inflamed but otherwise normal. No pneumoperitoneum identified. Vascular/Lymphatic: Some cavernous transformation of the main portal vein is redemonstrated. Diminutive main portal vein caliber, and diminutive junction with the SMV, but the mesenteric portal veins appear to remain patent. Splenic vein also remains patent on series 2, image 33. Superimposed major arterial structures in the abdomen and pelvis remain patent. Subtle aortic atherosclerosis. No lymphadenopathy identified. Reproductive: Within normal limits. Other: Small volume free fluid in the pelvis. Musculoskeletal: No acute osseous abnormality identified. IMPRESSION: 1. Unresolved pancreatic inflammation, with small but enlarging pseudocysts at both the porta hepatis (see also #2 - series 2, image 28) and pancreatic tail (image 34). Superimposed necrosis of the distal tail. Mildly increased from last month dependent fluid and inflammation in the abdominal gutters and pelvis. 2. Progressed, Moderate to Severe Hepatic Biliary Ductal Dilatation since last month. And superimposed ductal wall enhancement raises the possibility of Cholangitis. 3. Large Splenic Subcapsular Hematoma and/or complex collection (538 mL) not significantly changed. Mass effect on the spleen. 4. Organized left pleural collection with pleural thickening at the lung base has regressed but not resolved. Associated left lower lobe atelectasis. 5. 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 And  diminutive but patent main portal vein, splenic vein, SMV. Electronically Signed   By: Genevie Ann M.D.   On: 08/15/2022 05:53    Radiological Exams on Admission: CT ABDOMEN PELVIS W CONTRAST  Result Date: 08/15/2022 CLINICAL DATA:  37 year old female with abdominal pain. History of pancreatitis, subcapsular splenic hematoma. EXAM: CT ABDOMEN AND PELVIS WITH CONTRAST TECHNIQUE: Multidetector CT imaging of the abdomen and pelvis was performed using the standard protocol following bolus administration of intravenous contrast. RADIATION DOSE REDUCTION: This exam was performed according to the departmental dose-optimization program which includes automated exposure control, adjustment of the mA and/or kV according to patient size and/or use of iterative reconstruction technique. CONTRAST:  171m OMNIPAQUE IOHEXOL 300 MG/ML  SOLN COMPARISON:  CT Abdomen and Pelvis 07/21/2022 and earlier. FINDINGS: Lower chest: Decreased but not resolved left pleural effusion with associated pleural thickening and enhancement compatible with organized pleural collection (less likely empyema given interval decrease). Associated round atelectasis in the left lower lobe. No pericardial effusion. Partially visible SVC vascular catheter. Mild right lung base atelectasis. No right pleural effusion. Hepatobiliary: Heterogeneous liver perfusion and worsening intrahepatic biliary ductal dilatation with increased bile duct wall enhancement and central intrahepatic bile ducts are up to 10 mm diameter now (series 2, image 25). Gallbladder also slightly more distended. And there is a small but increasing porta hepatis fluid collection which is probably a pseudo cyst measuring 19 x 27 x 26 mm now, 2 cm or smaller last month. CBD appears to taper distally. Pancreas: Unresolved peripancreatic inflammation with partial pancreatic atrophy. Organizing but irregular 2.5 to 2.8 cm pseudocyst of the pancreatic tail on series 2, image 34 and coronal image 43.  Superimposed pancreatic necrosis of the distal tail (series 2, image 34). Peripancreatic inflammation. Spleen: Large subcapsular splenic fluid collection with mixed density previously thought to be subcapsular hematoma.  This is 87 x 125 by 99 mm (AP by transverse by CC) volume estimated at 500 38 mL. Size not significantly changed from last month. Mass effect on the more homogeneously enhancing splenic parenchyma as before. Adrenals/Urinary Tract: Despite regional inflammation, adrenal glands and kidneys remain within normal limits. No delayed renal images. Decompressed urinary bladder. Stomach/Bowel: No dilated large or small bowel loops. Increased free fluid and/or inflammatory stranding in both pericolic gutters since last month. No free air. Mass effect on the stomach secondary to abnormal spleen and other upper abdominal viscera. And small volume of fluid within the stomach. No discrete gastric fluid collection. Duodenum may be mildly inflamed but otherwise normal. No pneumoperitoneum identified. Vascular/Lymphatic: Some cavernous transformation of the main portal vein is redemonstrated. Diminutive main portal vein caliber, and diminutive junction with the SMV, but the mesenteric portal veins appear to remain patent. Splenic vein also remains patent on series 2, image 33. Superimposed major arterial structures in the abdomen and pelvis remain patent. Subtle aortic atherosclerosis. No lymphadenopathy identified. Reproductive: Within normal limits. Other: Small volume free fluid in the pelvis. Musculoskeletal: No acute osseous abnormality identified. IMPRESSION: 1. Unresolved pancreatic inflammation, with small but enlarging pseudocysts at both the porta hepatis (see also #2 - series 2, image 28) and pancreatic tail (image 34). Superimposed necrosis of the distal tail. Mildly increased from last month dependent fluid and inflammation in the abdominal gutters and pelvis. 2. Progressed, Moderate to Severe Hepatic  Biliary Ductal Dilatation since last month. And superimposed ductal wall enhancement raises the possibility of Cholangitis. 3. Large Splenic Subcapsular Hematoma and/or complex collection (538 mL) not significantly changed. Mass effect on the spleen. 4. Organized left pleural collection with pleural thickening at the lung base has regressed but not resolved. Associated left lower lobe atelectasis. 5. 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 And diminutive but patent main portal vein, splenic vein, SMV. Electronically Signed   By: Genevie Ann M.D.   On: 08/15/2022 05:53    DVT Prophylaxis -SCD/Heparin AM Labs Ordered, also please review Full Orders  Family Communication: Admission, patients condition and plan of care including tests being ordered have been discussed with the patient who indicate understanding and agree with the plan   Condition  -stable  Roxan Hockey M.D on 08/15/2022 at 4:06 PM Go to [email or web address] -  for contact info  [facility] - Office  [phone]

## 2022-08-15 NOTE — Consult Note (Signed)
Gastroenterology Consult   Referring Provider: No ref. provider found Primary Care Physician:  [name], MD Primary Gastroenterologist:  Dr. Marius Ditch   Patient ID: Jillian Shepherd; [id number]; 1986/05/19   Admit date: 08/14/2022  LOS: 0 days   Date of Consultation: 08/15/2022  Reason for Consultation:  recurrent pancreatitis   History of Present Illness   Jillian Shepherd is a 37 y.o. year old female  with history of alcohol abuse complicated by recurrent alcoholic pancreatitis, HTN, Liddle syndrome, splenic hematoma and splenic vein thrombosis, who came to the hospital due to worsening epigastric and RUQ pain with minimal PO intake at home. Receiving TPN through PICC line. Gastroenterology was consulted due to recurrent pancreatitis now with necrosis, concern for cholangitis.    ED course: Lipase 115, LFTs WNL, hgb 9.9   CT A/P w contrast: Unresolved pancreatic inflammation, with small but enlarging pseudocysts at both the porta hepatis and pancreatic tail (image 34). Superimposed necrosis of the distal tail. Mildly increased from last month dependent fluid and inflammation in the abdominal gutters and pelvis.   -Progressed, Moderate to Severe Hepatic Biliary Ductal Dilatation since last month. And superimposed ductal wall enhancement raises the possibility of Cholangitis.   -Large Splenic Subcapsular Hematoma and/or complex collection (569m) not significantly changed. Mass effect on the spleen.   -Organized left pleural collection with pleural thickening at the lung base has regressed but not resolved. Associated left lower lobe atelectasis.   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 And diminutive but patent main portal vein, splenic vein, SMV.  Consult: Patient states that she had pain at last DC, pain become much worse over the weekend. She has been drinking water, doing jello, some fruits and broth. Did not feel that eating was making her pain worse even  over the weekend when she began to feel worse. She notes a lot of nausea but no vomiting. Pain is mostly located in epigastric/RUQ area with some LUQ pain, worse with inspiration. She feels that her abdomen is bloated. Denies fevers but she has had some cold sweats. BMs have been fairly regular recently. Denies diarrhea.    Past Medical History:  Diagnosis Date   Abdominal pain    Alcohol abuse    B12 deficiency    Cancer (HCC)    squmaous cell skin cancer    Collagen vascular disease (HCC)    Hypokalemia    Liddle's syndrome    Obesity (BMI 35.0-39.9 without comorbidity)    Pancreatitis    Pleural effusion    Splenic hemorrhage     Past Surgical History:  Procedure Laterality Date   ADENOIDECTOMY     CHEST TUBE INSERTION     COLONOSCOPY WITH PROPOFOL N/A 10/06/2020   Procedure: COLONOSCOPY WITH PROPOFOL;  Surgeon: VLin Landsman MD;  Location: [facility];  Service: Gastroenterology;  Laterality: N/A;  COVID POSITIVE 08/23/2020   ESOPHAGOGASTRODUODENOSCOPY (EGD) WITH PROPOFOL N/A 10/06/2020   Procedure: ESOPHAGOGASTRODUODENOSCOPY (EGD) WITH PROPOFOL;  Surgeon: VLin Landsman MD;  Location: [place]  Service: Gastroenterology;  Laterality: N/A;   skin cancer removal Left    leg   TONSILLECTOMY      Prior to Admission medications   Medication Sig Start Date End Date Taking? Authorizing Provider  amLODipine (NORVASC) 10 MG tablet Take 1 tablet (10 mg total) by mouth daily. Patient taking differently: Take 10 mg by mouth in the morning. 04/10/22  Yes Howard, Amber S, FNP  cyanocobalamin (VITAMIN B12) 1000 MCG/ML injection Inject 1 mL (1,000  mcg total) into the muscle every 30 (thirty) days. 05/17/22  Yes [name], MD  folic acid (FOLVITE) 1 MG tablet Take 1 tablet (1 mg total) by mouth daily. Patient taking differently: Take 1 mg by mouth in the morning. 05/17/22 05/12/23 Yes [name], MD  lipase/protease/amylase (CREON) 36000 UNITS CPEP capsule  Take 2 capsules (72,000 Units total) by mouth 3 (three) times daily with meals. May also take 1 capsule (36,000 Units total) as needed (with snacks). 07/20/22  Yes Rubie Maid, FNP  metFORMIN (GLUCOPHAGE) 500 MG tablet Take 1 tablet (500 mg total) by mouth 2 (two) times daily with a meal. 05/18/22  Yes Pickard, Cammie Mcgee, MD  ondansetron (ZOFRAN) 4 MG tablet Take 1 tablet (4 mg total) by mouth every 6 (six) hours as needed for nausea or vomiting. 08/03/22 09/02/22 Yes [name], MD  Oxycodone HCl 10 MG TABS Take 0.5-1 tablets (5-10 mg total) by mouth every 4 (four) hours as needed. 08/08/22  Yes [name], MD  pantoprazole (PROTONIX) 40 MG tablet Take 1 tablet (40 mg total) by mouth daily. 07/13/22 08/12/22  Deatra James, MD    Current Facility-Administered Medications  Medication Dose Route Frequency Provider Last Rate Last Admin   0.9 %  sodium chloride infusion   Intravenous PRN Emokpae, Courage, MD       0.9 %  sodium chloride infusion   Intravenous PRN Emokpae, Courage, MD       0.9 % NaCl with KCl 20 mEq/ L  infusion   Intravenous Continuous Emokpae, Courage, MD 150 mL/hr at 08/15/22 0833 New Bag at 08/15/22 1017   acetaminophen (TYLENOL) tablet 650 mg  650 mg Oral Q6H PRN Roxan Hockey, MD       Or   acetaminophen (TYLENOL) suppository 650 mg  650 mg Rectal Q6H PRN Emokpae, Courage, MD       amLODipine (NORVASC) tablet 10 mg  10 mg Oral Daily Emokpae, Courage, MD   10 mg at 08/15/22 1035   bisacodyl (DULCOLAX) suppository 10 mg  10 mg Rectal Daily PRN Emokpae, Courage, MD       cefTRIAXone (ROCEPHIN) 2 g in sodium chloride 0.9 % 100 mL IVPB  2 g Intravenous Q24H Emokpae, Courage, MD   Stopped at 51/02/58 5277   folic acid (FOLVITE) tablet 1 mg  1 mg Oral Daily Emokpae, Courage, MD   1 mg at 08/15/22 1034   heparin injection 5,000 Units  5,000 Units Subcutaneous Q8H Emokpae, Courage, MD       HYDROmorphone (DILAUDID) injection 1 mg  1 mg Intravenous Q2H PRN Emokpae,  Courage, MD   1 mg at 08/15/22 1523   ketorolac (TORADOL) 15 MG/ML injection 15 mg  15 mg Intravenous Q8H Emokpae, Courage, MD   15 mg at 08/15/22 1523   lipase/protease/amylase (CREON) capsule 36,000 Units  36,000 Units Oral TID AC Emokpae, Courage, MD   36,000 Units at 08/15/22 1217   meclizine (ANTIVERT) tablet 25 mg  25 mg Oral Q8H Howard, Amber S, FNP       meclizine (ANTIVERT) tablet 25 mg  25 mg Oral TID PRN Denton Brick, Courage, MD       metroNIDAZOLE (FLAGYL) IVPB 500 mg  500 mg Intravenous Q12H Roxan Hockey, MD   Stopped at 08/15/22 1325   multivitamin with minerals tablet 1 tablet  1 tablet Oral Daily Emokpae, Courage, MD   1 tablet at 08/15/22 1035   ondansetron (ZOFRAN) tablet 4 mg  4  mg Oral Q6H PRN Roxan Hockey, MD       Or   ondansetron (ZOFRAN) injection 4 mg  4 mg Intravenous Q6H PRN Denton Brick, Courage, MD   4 mg at 08/15/22 1334   oxyCODONE (Oxy IR/ROXICODONE) immediate release tablet 5 mg  5 mg Oral Q4H PRN Emokpae, Courage, MD       pantoprazole (PROTONIX) EC tablet 40 mg  40 mg Oral Daily Emokpae, Courage, MD   40 mg at 08/15/22 1036   polyethylene glycol (MIRALAX / GLYCOLAX) packet 17 g  17 g Oral Daily PRN Emokpae, Courage, MD       sodium chloride flush (NS) 0.9 % injection 3 mL  3 mL Intravenous Q12H Emokpae, Courage, MD   3 mL at 08/15/22 1038   sodium chloride flush (NS) 0.9 % injection 3 mL  3 mL Intravenous Q12H Emokpae, Courage, MD   3 mL at 08/15/22 1039   sodium chloride flush (NS) 0.9 % injection 3 mL  3 mL Intravenous PRN Emokpae, Courage, MD       sodium chloride flush (NS) 0.9 % injection 3 mL  3 mL Intravenous Q12H Emokpae, Courage, MD       sodium chloride flush (NS) 0.9 % injection 3 mL  3 mL Intravenous Q12H Emokpae, Courage, MD       sodium chloride flush (NS) 0.9 % injection 3 mL  3 mL Intravenous PRN Emokpae, Courage, MD       thiamine (VITAMIN B1) tablet 100 mg  100 mg Oral Daily Emokpae, Courage, MD   100 mg at 08/15/22 1036   traZODone (DESYREL)  tablet 50 mg  50 mg Oral QHS PRN Roxan Hockey, MD       Current Outpatient Medications  Medication Sig Dispense Refill   amLODipine (NORVASC) 10 MG tablet Take 1 tablet (10 mg total) by mouth daily. (Patient taking differently: Take 10 mg by mouth in the morning.) 90 tablet 3   cyanocobalamin (VITAMIN B12) 1000 MCG/ML injection Inject 1 mL (1,000 mcg total) into the muscle every 30 (thirty) days. 1 mL 11   folic acid (FOLVITE) 1 MG tablet Take 1 tablet (1 mg total) by mouth daily. (Patient taking differently: Take 1 mg by mouth in the morning.) 30 tablet 11   lipase/protease/amylase (CREON) 36000 UNITS CPEP capsule Take 2 capsules (72,000 Units total) by mouth 3 (three) times daily with meals. May also take 1 capsule (36,000 Units total) as needed (with snacks). 240 capsule 11   metFORMIN (GLUCOPHAGE) 500 MG tablet Take 1 tablet (500 mg total) by mouth 2 (two) times daily with a meal. 180 tablet 3   ondansetron (ZOFRAN) 4 MG tablet Take 1 tablet (4 mg total) by mouth every 6 (six) hours as needed for nausea or vomiting. 30 tablet 0   Oxycodone HCl 10 MG TABS Take 0.5-1 tablets (5-10 mg total) by mouth every 4 (four) hours as needed. 30 tablet 0   pantoprazole (PROTONIX) 40 MG tablet Take 1 tablet (40 mg total) by mouth daily. 30 tablet 0    Allergies as of 08/14/2022   (No Known Allergies)    Family History  Problem Relation Age of Onset   Hypertension Father    Anxiety disorder Father    Depression Father    Melanoma Paternal Grandmother        of skin   Diabetes Paternal Grandmother    Cirrhosis Paternal Uncle     Social History   Socioeconomic History   Marital status:  Married    Spouse name: Not on file   Number of children: Not on file   Years of education: Not on file   Highest education level: Not on file  Occupational History   Not on file  Tobacco Use   Smoking status: Every Day    Packs/day: 0.25    Years: 9.00    Total pack years: 2.25    Types: Cigarettes    Smokeless tobacco: Never  Vaping Use   Vaping Use: Never used  Substance and Sexual Activity   Alcohol use: Yes    Alcohol/week: 2.0 standard drinks of alcohol    Types: 2 Cans of beer per week   Drug use: No   Sexual activity: Yes    Birth control/protection: None  Other Topics Concern   Not on file  Social History Narrative   ** Merged History Encounter **       Social Determinants of Health   Financial Resource Strain: Not on file  Food Insecurity: No Food Insecurity (07/22/2022)   Hunger Vital Sign    Worried About Running Out of Food in the Last Year: Never true    Ran Out of Food in the Last Year: Never true  Transportation Needs: No Transportation Needs (07/22/2022)   PRAPARE - Hydrologist (Medical): No    Lack of Transportation (Non-Medical): No  Physical Activity: Not on file  Stress: Not on file  Social Connections: Not on file  Intimate Partner Violence: Not At Risk (07/22/2022)   Humiliation, Afraid, Rape, and Kick questionnaire    Fear of Current or Ex-Partner: No    Emotionally Abused: No    Physically Abused: No    Sexually Abused: No     Review of Systems   Gen: Denies any fever, chills, loss of appetite, change in weight or weight loss CV: Denies chest pain, heart palpitations, syncope, edema  Resp: Denies shortness of breath with rest, cough, wheezing, coughing up blood, and pleurisy. GI: denies melena, hematochezia, vomiting, diarrhea, constipation, dysphagia, odyonophagia, early satiety or weight loss. +nausea +abdominal pain GU : Denies urinary burning, blood in urine, urinary frequency, and urinary incontinence. MS: Denies joint pain, limitation of movement, swelling, cramps, and atrophy.  Derm: Denies rash, itching, dry skin, hives. Psych: Denies depression, anxiety, memory loss, hallucinations, and confusion. Heme: Denies bruising or bleeding Neuro:  Denies any headaches, dizziness, paresthesias,  shaking  Physical Exam   Vital Signs in last 24 hours: Temp:  [97.6 F (36.4 C)-98.7 F (37.1 C)] 97.6 F (36.4 C) (01/10 1217) Pulse Rate:  [74-108] 83 (01/10 1418) Resp:  [13-20] 20 (01/10 1418) BP: (101-141)/(74-101) 125/74 (01/10 1418) SpO2:  [90 %-100 %] 92 % (01/10 1418)   General:   Alert,  Well-developed, well-nourished, pleasant and cooperative in NAD Head:  Normocephalic and atraumatic. Eyes:  Sclera clear, no icterus.   Conjunctiva pink. Ears:  Normal auditory acuity. Mouth:  No deformity or lesions, dentition normal. Lungs:  Clear throughout to auscultation.   No wheezes, crackles, or rhonchi. No acute distress. Heart:  Regular rate and rhythm; no murmurs, clicks, rubs,  or gallops. Abdomen:  Soft, and nondistended. TTP of RUQ/epigastric region. No masses, hepatosplenomegaly or hernias noted. Normal bowel sounds, without guarding, and without rebound.   Msk:  Symmetrical without gross deformities. Normal posture. Extremities:  Without clubbing or edema. Neurologic:  Alert and  oriented x4. Skin:  Intact without significant lesions or rashes. Psych:  Alert and cooperative. Normal  mood and affect.  Intake/Output from previous day: No intake/output data recorded. Intake/Output this shift: No intake/output data recorded.  '@WEIGHTS'$ @  Labs/Studies   Recent Labs Recent Labs    08/15/22 0040  WBC 10.1  HGB 12.0  HCT 36.7  PLT 567*   BMET Recent Labs    08/15/22 0040  NA 133*  K 3.2*  CL 98  CO2 24  GLUCOSE 99  BUN 10  CREATININE 0.45  CALCIUM 8.8*   LFT Recent Labs    08/15/22 0040  PROT 7.9  ALBUMIN 2.8*  AST 18  ALT 12  ALKPHOS 92  BILITOT 0.9   PT/INR No results for input(s): "LABPROT", "INR" in the last 72 hours. Hepatitis Panel No results for input(s): "HEPBSAG", "HCVAB", "HEPAIGM", "HEPBIGM" in the last 72 hours. C-Diff No results for input(s): "CDIFFTOX" in the last 72 hours.  Radiology/Studies CT ABDOMEN PELVIS W  CONTRAST  Result Date: 08/15/2022 CLINICAL DATA:  37 year old female with abdominal pain. History of pancreatitis, subcapsular splenic hematoma. EXAM: CT ABDOMEN AND PELVIS WITH CONTRAST TECHNIQUE: Multidetector CT imaging of the abdomen and pelvis was performed using the standard protocol following bolus administration of intravenous contrast. RADIATION DOSE REDUCTION: This exam was performed according to the departmental dose-optimization program which includes automated exposure control, adjustment of the mA and/or kV according to patient size and/or use of iterative reconstruction technique. CONTRAST:  159m OMNIPAQUE IOHEXOL 300 MG/ML  SOLN COMPARISON:  CT Abdomen and Pelvis 07/21/2022 and earlier. FINDINGS: Lower chest: Decreased but not resolved left pleural effusion with associated pleural thickening and enhancement compatible with organized pleural collection (less likely empyema given interval decrease). Associated round atelectasis in the left lower lobe. No pericardial effusion. Partially visible SVC vascular catheter. Mild right lung base atelectasis. No right pleural effusion. Hepatobiliary: Heterogeneous liver perfusion and worsening intrahepatic biliary ductal dilatation with increased bile duct wall enhancement and central intrahepatic bile ducts are up to 10 mm diameter now (series 2, image 25). Gallbladder also slightly more distended. And there is a small but increasing porta hepatis fluid collection which is probably a pseudo cyst measuring 19 x 27 x 26 mm now, 2 cm or smaller last month. CBD appears to taper distally. Pancreas: Unresolved peripancreatic inflammation with partial pancreatic atrophy. Organizing but irregular 2.5 to 2.8 cm pseudocyst of the pancreatic tail on series 2, image 34 and coronal image 43. Superimposed pancreatic necrosis of the distal tail (series 2, image 34). Peripancreatic inflammation. Spleen: Large subcapsular splenic fluid collection with mixed density  previously thought to be subcapsular hematoma. This is 87 x 125 by 99 mm (AP by transverse by CC) volume estimated at 500 38 mL. Size not significantly changed from last month. Mass effect on the more homogeneously enhancing splenic parenchyma as before. Adrenals/Urinary Tract: Despite regional inflammation, adrenal glands and kidneys remain within normal limits. No delayed renal images. Decompressed urinary bladder. Stomach/Bowel: No dilated large or small bowel loops. Increased free fluid and/or inflammatory stranding in both pericolic gutters since last month. No free air. Mass effect on the stomach secondary to abnormal spleen and other upper abdominal viscera. And small volume of fluid within the stomach. No discrete gastric fluid collection. Duodenum may be mildly inflamed but otherwise normal. No pneumoperitoneum identified. Vascular/Lymphatic: Some cavernous transformation of the main portal vein is redemonstrated. Diminutive main portal vein caliber, and diminutive junction with the SMV, but the mesenteric portal veins appear to remain patent. Splenic vein also remains patent on series 2, image 33. Superimposed major arterial  structures in the abdomen and pelvis remain patent. Subtle aortic atherosclerosis. No lymphadenopathy identified. Reproductive: Within normal limits. Other: Small volume free fluid in the pelvis. Musculoskeletal: No acute osseous abnormality identified. IMPRESSION: 1. Unresolved pancreatic inflammation, with small but enlarging pseudocysts at both the porta hepatis (see also #2 - series 2, image 28) and pancreatic tail (image 34). Superimposed necrosis of the distal tail. Mildly increased from last month dependent fluid and inflammation in the abdominal gutters and pelvis. 2. Progressed, Moderate to Severe Hepatic Biliary Ductal Dilatation since last month. And superimposed ductal wall enhancement raises the possibility of Cholangitis. 3. Large Splenic Subcapsular Hematoma and/or  complex collection (538 mL) not significantly changed. Mass effect on the spleen. 4. Organized left pleural collection with pleural thickening at the lung base has regressed but not resolved. Associated left lower lobe atelectasis. 5. 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 And diminutive but patent main portal vein, splenic vein, SMV. Electronically Signed   By: Genevie Ann M.D.   On: 08/15/2022 05:53     Assessment   Jillian Shepherd is a 37 y.o. year old female  with history of ETOH abuse complicated by recurrent alcoholic pancreatitis, hypertension, Liddle syndrome, splenic hematoma and splenic vein thrombosis, presenting this admission with worsening abdominal pain due to recurrent pancreatitis. Well-known to GI with recent hospitalization for recurrent pancreatitis. Last admission in December for similar.   Recurrent pancreatitis with necrosis/pseudocyst: history of acute on chronic pancreatitis, last admission in December 2023 when TPN was initiated via PICC line due to intolerance to adequate PO intake. She was d/c on 12/22. Worsening abdominal pain over the weekend. No fevers, but having cold sweats and nausea. Tolerating some POs at home to include water, jello broth and fruits, did not feel that PO intake made pain worse. Case   Case discussed with advanced endoscopist Dr. Early Osmond with [facility] who reviewed the case and does not feel that pseudocyst or necrosis requires immediate intervention at this time. there was concern for cholangitis on the CT, though LFTs remain WNL, low likelihood of cholangitis given this. ERCP likely would not be very beneficial at this time, as potential that pseudocyst/fluid collection is causing some narrowing of bile duct, if LFTs start to increase and this is thought secondary to pseudocyst EUS for drainage may then be warranted. Will continue TPN for now, potential trickle feeds via [place] if she is not tolerating POs  Non occlusive thrombus of portal  vein/splenic vein thrombosis/Splenic hematoma: Large Splenic Subcapsular Hematoma and/or complex collection, not significantly changed from previous imaging. Mass effect on the spleen and Partial cavernous transformation of the portal vein, diminutive but patent main portal vein, splenic vein, SMV. As above, case was reviewed by advanced endoscopist with [facility], Dr. Rush Landmark advised in most cases these will decrease in size over time, if this does not/becomes larger, may require evacuation vs drainage, if there is any concern for infection, may need to consider aspiration, however evacuation of hematoma Is high risk procedure and would not be indicated unless this is becoming more of an issue. Will trend h&h, signficant drops in hgb would require repeat CT imaging for further evaluation of hematoma. ACs for thrombus/thrombosis contraindicated at this time due to hematoma.      Plan / Recommendations    Continue with TPN Trend h&h, will need repeat CT for further evaluation of splenic hematoma if hgb drops PPI daily Continue rocephin 2g daily Continue creon Nausea/pain managed via hospitalist  7. Consider percutaneous drainage of  pseudocyst if LFTs start to rise 8. May need referral as outpatien to tertiary care center     08/15/2022, 4:10 PM  Jillian Shepherd L. Alver Sorrow, MSN, APRN, AGNP-C Adult-Gerontology Nurse Practitioner [facility] at [facility]

## 2022-08-15 NOTE — ED Provider Notes (Signed)
[facility] EMERGENCY DEPARTMENT Provider Note   CSN: [id number] Arrival date & time: 08/14/22  1909     History  Chief Complaint  Patient presents with   Abdominal Pain    Jillian Shepherd is a 37 y.o. female.  Patient is a 37 year old female with past medical history of chronic abdominal pain related to chronic pancreatitis.  Patient receives TPN through a PICC line in the right upper extremity.  She has had multiple admissions in the past with similar presentations.  Patient presenting this evening with epigastric and right upper quadrant pain.  She denies any fevers or chills.  She denies any vomiting.  She reports minimal oral intake at home.  The history is provided by the patient.  Abdominal Pain Pain location:  Epigastric Pain quality: stabbing        Home Medications Prior to Admission medications   Medication Sig Start Date End Date Taking? Authorizing Provider  amLODipine (NORVASC) 10 MG tablet Take 1 tablet (10 mg total) by mouth daily. Patient taking differently: Take 10 mg by mouth in the morning. 04/10/22   Rubie Maid, FNP  cyanocobalamin (VITAMIN B12) 1000 MCG/ML injection Inject 1 mL (1,000 mcg total) into the muscle every 30 (thirty) days. 05/17/22   [name], MD  folic acid (FOLVITE) 1 MG tablet Take 1 tablet (1 mg total) by mouth daily. Patient taking differently: Take 1 mg by mouth in the morning. 05/17/22 05/12/23  [name], MD  lipase/protease/amylase (CREON) 36000 UNITS CPEP capsule Take 2 capsules (72,000 Units total) by mouth 3 (three) times daily with meals. May also take 1 capsule (36,000 Units total) as needed (with snacks). 07/20/22   Rubie Maid, FNP  metFORMIN (GLUCOPHAGE) 500 MG tablet Take 1 tablet (500 mg total) by mouth 2 (two) times daily with a meal. 05/18/22   Pickard, Cammie Mcgee, MD  ondansetron (ZOFRAN) 4 MG tablet Take 1 tablet (4 mg total) by mouth every 6 (six) hours as needed for nausea or vomiting. 08/03/22 09/02/22   [name], MD  Oxycodone HCl 10 MG TABS Take 0.5-1 tablets (5-10 mg total) by mouth every 4 (four) hours as needed. 08/08/22   [name], MD  pantoprazole (PROTONIX) 40 MG tablet Take 1 tablet (40 mg total) by mouth daily. 07/13/22 08/12/22  Deatra James, MD      Allergies    Patient has no known allergies.    Review of Systems   Review of Systems  Gastrointestinal:  Positive for abdominal pain.  All other systems reviewed and are negative.   Physical Exam Updated Vital Signs BP (!) 140/101 (BP Location: Left Arm)   Pulse (!) 108   Temp 98.2 F (36.8 C) (Oral)   Resp 18   LMP 07/01/2022 (Approximate)   SpO2 100%  Physical Exam Vitals and nursing note reviewed.  Constitutional:      General: She is not in acute distress.    Appearance: She is well-developed. She is not diaphoretic.  HENT:     Head: Normocephalic and atraumatic.  Cardiovascular:     Rate and Rhythm: Normal rate and regular rhythm.     Heart sounds: No murmur heard.    No friction rub. No gallop.  Pulmonary:     Effort: Pulmonary effort is normal. No respiratory distress.     Breath sounds: Normal breath sounds. No wheezing.  Abdominal:     General: Bowel sounds are normal. There is no distension.  Palpations: Abdomen is soft.     Tenderness: There is abdominal tenderness in the right upper quadrant and epigastric area. There is no guarding or rebound.  Musculoskeletal:        General: Normal range of motion.     Cervical back: Normal range of motion and neck supple.  Skin:    General: Skin is warm and dry.  Neurological:     General: No focal deficit present.     Mental Status: She is alert and oriented to person, place, and time.     ED Results / Procedures / Treatments   Labs (all labs ordered are listed, but only abnormal results are displayed) Labs Reviewed  LIPASE, BLOOD  COMPREHENSIVE METABOLIC PANEL  CBC  URINALYSIS, ROUTINE W REFLEX MICROSCOPIC  POC URINE PREG, ED     EKG None  Radiology No results found.  Procedures Procedures    Medications Ordered in ED Medications  ondansetron (ZOFRAN-ODT) disintegrating tablet 8 mg (8 mg Oral Given 08/14/22 2110)  oxyCODONE-acetaminophen (PERCOCET/ROXICET) 5-325 MG per tablet 1 tablet (1 tablet Oral Given 08/14/22 2110)    ED Course/ Medical Decision Making/ A&P  Patient is a 37 year old female with history of chronic pancreatitis presenting with epigastric pain.  This feels similar to her prior pancreatitis flareups.  She arrives here with stable vital signs and is afebrile.  There is tenderness to palpation in the left upper and right upper quadrants as well as epigastric region.  Workup initiated including CBC, metabolic panel, LFTs, and lipase.  Studies all unremarkable with the exception of a lipase of 120.  CT scan obtained showing slightly increased size of the pancreatic pseudocyst seen on prior scan.  There is also some biliary ductal dilatation concerning for possible cholangitis.  Patient was given Zosyn to cover for this.  Patient given IV fluids along with Dilaudid x 3, Zosyn x 2, with little relief of her discomfort.  Patient will be admitted for pain control and further observation.  I spoken with the hospitalist who agrees to admit.  Final Clinical Impression(s) / ED Diagnoses Final diagnoses:  None    Rx / DC Orders ED Discharge Orders     None         Veryl Speak, MD 08/15/22 [phone]

## 2022-08-16 ENCOUNTER — Inpatient Hospital Stay: Payer: 59 | Admitting: Family Medicine

## 2022-08-16 ENCOUNTER — Encounter (HOSPITAL_COMMUNITY): Payer: Self-pay | Admitting: Family Medicine

## 2022-08-16 DIAGNOSIS — K859 Acute pancreatitis without necrosis or infection, unspecified: Secondary | ICD-10-CM | POA: Diagnosis not present

## 2022-08-16 LAB — CBC
HCT: 32.3 % — ABNORMAL LOW (ref 36.0–46.0)
Hemoglobin: 10 g/dL — ABNORMAL LOW (ref 12.0–15.0)
MCH: 27.6 pg (ref 26.0–34.0)
MCHC: 31 g/dL (ref 30.0–36.0)
MCV: 89.2 fL (ref 80.0–100.0)
Platelets: 494 10*3/uL — ABNORMAL HIGH (ref 150–400)
RBC: 3.62 MIL/uL — ABNORMAL LOW (ref 3.87–5.11)
RDW: 17.2 % — ABNORMAL HIGH (ref 11.5–15.5)
WBC: 6.1 10*3/uL (ref 4.0–10.5)
nRBC: 0 % (ref 0.0–0.2)

## 2022-08-16 LAB — COMPREHENSIVE METABOLIC PANEL
ALT: 11 U/L (ref 0–44)
AST: 20 U/L (ref 15–41)
Albumin: 2.2 g/dL — ABNORMAL LOW (ref 3.5–5.0)
Alkaline Phosphatase: 66 U/L (ref 38–126)
Anion gap: 7 (ref 5–15)
BUN: 7 mg/dL (ref 6–20)
CO2: 24 mmol/L (ref 22–32)
Calcium: 8 mg/dL — ABNORMAL LOW (ref 8.9–10.3)
Chloride: 107 mmol/L (ref 98–111)
Creatinine, Ser: 0.39 mg/dL — ABNORMAL LOW (ref 0.44–1.00)
GFR, Estimated: >60 mL/min (ref >60)
Glucose, Bld: 60 mg/dL — ABNORMAL LOW (ref 70–99)
Potassium: 3.7 mmol/L (ref 3.5–5.1)
Sodium: 138 mmol/L (ref 135–145)
Total Bilirubin: 0.3 mg/dL (ref 0.3–1.2)
Total Protein: 6.2 g/dL — ABNORMAL LOW (ref 6.5–8.1)

## 2022-08-16 LAB — GLUCOSE, CAPILLARY
Glucose-Capillary: 66 mg/dL — ABNORMAL LOW (ref 70–99)
Glucose-Capillary: 86 mg/dL (ref 70–99)

## 2022-08-16 LAB — PROTIME-INR
INR: 1.1 (ref 0.8–1.2)
Prothrombin Time: 14.4 seconds (ref 11.4–15.2)

## 2022-08-16 MED ORDER — INSULIN ASPART 100 UNIT/ML IJ SOLN
0.0000 [IU] | Freq: Four times a day (QID) | INTRAMUSCULAR | Status: DC
  Administered 2022-08-17 – 2022-08-18 (×2): 1 [IU] via SUBCUTANEOUS

## 2022-08-16 MED ORDER — MAGNESIUM SULFATE IN D5W 1-5 GM/100ML-% IV SOLN
1.0000 g | Freq: Once | INTRAVENOUS | Status: AC
  Administered 2022-08-16: 1 g via INTRAVENOUS
  Filled 2022-08-16: qty 100

## 2022-08-16 MED ORDER — KCL IN DEXTROSE-NACL 20-5-0.45 MEQ/L-%-% IV SOLN: INTRAVENOUS | Status: DC

## 2022-08-16 MED ORDER — INSULIN ASPART 100 UNIT/ML IJ SOLN: 0.0000 [IU] | INTRAMUSCULAR | Status: DC

## 2022-08-16 MED ORDER — TRAVASOL 10 % IV SOLN
INTRAVENOUS | Status: AC
  Filled 2022-08-16: qty 1113.6

## 2022-08-16 MED ORDER — INSULIN ASPART 100 UNIT/ML IJ SOLN: 0.0000 [IU] | Freq: Four times a day (QID) | INTRAMUSCULAR | Status: DC

## 2022-08-16 MED ORDER — POTASSIUM CHLORIDE IN NACL 20-0.9 MEQ/L-% IV SOLN: INTRAVENOUS | Status: DC

## 2022-08-16 NOTE — TOC Initial Note (Signed)
Transition of Care [facility]) - Initial/Assessment Note    Patient Details  Name: Jillian Shepherd MRN: [id number] Date of Birth: 1985/12/18  [facility]) CM/SW Contact:    Boneta Lucks, RN Phone Number: 08/16/2022, 12:20 PM  Clinical Narrative:     Patient readmitted with acute recurrent pancreatitis. Patient is very ill. MD reporting to restart TPN. CM left Carolynn Sayers with [employer] a message for her to follow this patient for discharge needs.    Barriers to Discharge: Continued Medical Work up   Patient Goals and CMS Choice    Expected Discharge Plan and Services     Prior Living Arrangements/Services     Activities of Daily Living Home Assistive Devices/Equipment: Other (Comment) (TPN, IV pump, supplies) ADL Screening (condition at time of admission) Patient's cognitive ability adequate to safely complete daily activities?: Yes Is the patient deaf or have difficulty hearing?: No Does the patient have difficulty seeing, even when wearing glasses/contacts?: No Does the patient have difficulty concentrating, remembering, or making decisions?: No Patient able to express need for assistance with ADLs?: Yes Does the patient have difficulty dressing or bathing?: No Independently performs ADLs?: Yes (appropriate for developmental age) Does the patient have difficulty walking or climbing stairs?: No Weakness of Legs: None Weakness of Arms/Hands: None  Permission Sought/Granted   Emotional Assessment     Admission diagnosis:  Vertigo [R42] Acute recurrent pancreatitis [K85.90] Pancreatitis, necrotizing [K85.91] Patient Active Problem List   Diagnosis Date Noted   Acute recurrent pancreatitis 08/15/2022   Pancreatitis, necrotizing 08/15/2022   Portal vein thrombosis 07/24/2022   Leukocytosis 07/21/2022   Chronic pancreatitis ([place]) 07/18/2022   Dehydration 07/07/2022   Diabetes mellitus type II, controlled ([place]) 07/01/2022   Acute on chronic pancreatitis  06/30/2022   Malnutrition of moderate degree 06/12/2022   Pancreatic pseudocyst 06/11/2022   Acute hypoxic respiratory failure ([place]) 06/10/2022   Pleural effusion on left 06/10/2022   Spleen hematoma 06/08/2022   Acute pancreatitis 05/31/2022   Splenic vein thrombosis 05/31/2022   Liddle's syndrome 05/31/2022   Hypoxia 05/11/2022   Abdominal pain 05/10/2022   Nausea & vomiting 05/10/2022   Hypokalemia 05/10/2022   Hypomagnesemia 05/10/2022   Prolonged QT interval 05/10/2022   Hyperglycemia 05/10/2022   Alcohol abuse 48/25/0037   Acute alcoholic pancreatitis 04/88/8916   Diarrhea    Pancreatitis, acute 07/27/2020   Sacral back pain 09/14/2019   Bilateral ovarian cysts 09/02/2018   Elevated blood pressure reading without diagnosis of hypertension 01/20/2015   Essential hypertension 07/07/2014   Depression 07/07/2014   Obesity 07/07/2014   PCP:  [name], MD Pharmacy:   [id number] [id number]: [phone]: [phone]    Social Determinants of Health (SDOH) Social History: SDOH Screenings   Food Insecurity: No Food Insecurity (08/15/2022)  Housing: Low Risk  (08/15/2022)  Transportation Needs: No Transportation Needs (08/15/2022)  Utilities: Not At Risk (08/15/2022)  Depression (PHQ2-9): Low Risk  (05/17/2022)  Tobacco Use: High Risk (08/16/2022)   SDOH Interventions:    Readmission Risk Interventions     No data to display

## 2022-08-16 NOTE — Progress Notes (Signed)
Pt receiving prn hydromorphone for management of ABD pain.  Zofran x 1 for nausea.  ABD soft, tender, BSA x 4 quads.  Voiding without difficulty.  Ambulatory in room.  PICC line dressing intact.  NSR on monitor.

## 2022-08-16 NOTE — Progress Notes (Signed)
Patient has been needing dilaudid every two hours for pain management. TPN started this evening. Miralax given d/t constipation and abd pain.. If ineffective will try Doculax suppository. Patient has been having poor po intake. Zofran given x2 this shift.

## 2022-08-16 NOTE — Progress Notes (Signed)
Initial Nutrition Assessment  DOCUMENTATION CODES:      INTERVENTION:  TPN per pharmacy  NUTRITION DIAGNOSIS:   Moderate Malnutrition related to catabolic illness, acute illness, chronic illness (acute on chronic pancreatitis) as evidenced by percent weight loss, mild fat depletion, mild muscle depletion.   GOAL:  Patient will meet greater than or equal to 90% of their needs   MONITOR:  Labs, Weight trends (po intake clear liquids)  REASON FOR ASSESSMENT:  Consult (TPN/TNA)    ASSESSMENT: patient is a 37 yo female with hx of DM2, malnutrition, acute recurrent pancreatitis with necrosis/pseudocyst. Known to RD from previous admissions.   Clear liquids currently. TPN is being resumed.   TPN at home. She has been eating pudding, ice cream, popsicle, Mango Ice and broth.    Weight range 67-70 kg December. September patient weight 85-86 kg. A loss of 18% (15-16 kg) body weight x 4 months which is clinically significant.  Medications: folic acid, MVI, Thiamine and IV-flagyl  IVF NS @ 150 ml/hr      Latest Ref Rng & Units 08/16/2022    4:32 AM 08/15/2022   12:40 AM 07/27/2022    6:33 AM  BMP  Glucose 70 - 99 mg/dL 60  99  125   BUN 6 - 20 mg/dL '7  10  11   '$ Creatinine 0.44 - 1.00 mg/dL 0.39  0.45  0.39   Sodium 135 - 145 mmol/L 138  133  134   Potassium 3.5 - 5.1 mmol/L 3.7  3.2  4.6   Chloride 98 - 111 mmol/L 107  98  103   CO2 22 - 32 mmol/L '24  24  27   '$ Calcium 8.9 - 10.3 mg/dL 8.0  8.8  8.3       NUTRITION - FOCUSED PHYSICAL EXAM:  Nutrition-Focused physical exam findings are mild fat depletion, mild and moderate muscle depletion, and mild edema.    Diet Order:   Diet Order             Diet clear liquid Room service appropriate? Yes; Fluid consistency: Thin  Diet effective now                   EDUCATION NEEDS:  Education needs have been addressed  Skin:  Skin Assessment: Reviewed RN Assessment  Last BM:  1/9  Height:   Ht Readings from Last 1  Encounters:  08/15/22 '5\' 3"'$  (1.6 m)    Weight:   Wt Readings from Last 1 Encounters:  08/15/22 69.6 kg    Ideal Body Weight:   52 kg  BMI:  Body mass index is 27.19 kg/m.  Estimated Nutritional Needs:   Kcal:  1800-2000  Protein:  100-115 gr  Fluid:  1.8-2 liters daily  Colman Cater MS,RD,CSG,LDN Contact: Shea Evans

## 2022-08-16 NOTE — Inpatient Diabetes Management (Signed)
Inpatient Diabetes Program Recommendations  AACE/ADA: New Consensus Statement on Inpatient Glycemic Control (2015)  Target Ranges:  Prepandial:   less than 140 mg/dL      Peak postprandial:   less than 180 mg/dL (1-2 hours)      Critically ill patients:  140 - 180 mg/dL   Lab Results  Component Value Date   GLUCAP 143 (H) 07/27/2022   HGBA1C 5.0 07/22/2022    Review of Glycemic Control  Latest Reference Range & Units 08/16/22 04:32  Glucose 70 - 99 mg/dL 60 (L)  (L): Data is abnormally low Diabetes history: Type 2 DM Outpatient Diabetes medications: Metformin 500 mg BID Current orders for Inpatient glycemic control: none  Inpatient Diabetes Program Recommendations:    Noted mild hypoglycemia of 60 mg/dL. May want to add CBGs TID & HS.   Thanks, Bronson Curb, MSN, RNC-OB Diabetes Coordinator [phone] (8a-5p)

## 2022-08-16 NOTE — Progress Notes (Addendum)
PROGRESS NOTE     Jillian Shepherd, is a 37 y.o. female, DOB - October 16, 1985, YHC:[id number]  Admit date - 08/14/2022   Admitting Physician Lashane Whelpley Denton Brick, MD  Outpatient Primary MD for the patient is Pickard, Cammie Mcgee, MD  LOS - 1  Chief Complaint  Patient presents with   Abdominal Pain        Brief summary -37 y.o. female  with PMH Liddle syndrome, obesity, alcoholic pancreatitis/chronic pancreatitis with pseudocyst formation with recurrent admissions for same  -Patient readmitted on 08/15/2022 with abdominal pain consistent with recurrent/acute on chronic pancreatitis --On 08/15/2022 had Conference with Dr. Gala Romney Linna Hoff) as well as Dr. Rush Landmark and  Dr. Lyndel Safe from [facility]--recommendations as outlined in the assessment and plan section of the admission H&P  A/p 1)Acute on chronic Pancreatitis Pseudocyst formation -Repeat CT abdomen and pelvis from 08/23/2022 shows  Unresolved pancreatic inflammation, with small but enlarging pseudocysts at both the porta hepatis and pancreatic tail with Superimposed necrosis of the distal tail. Mildly increased from last month dependent fluid and inflammation in the abdominal gutters and pelvis. --Also has mild increase of 3.5 cm pancreatic pseudocyst adjacent to pancreatic tail -GI following, appreciate assistance.  Patient has been unable to tolerate adequate oral nutrition and was recommended for initiation of TPN - PICC placed on 07/25/2022 and TPN has been started with plans for continuing TPN at home and slow diet advancement -Discussed with Dr. Rush Landmark from GI service who states pseudocyst in the pancreas tail/necrosis or possible necrosis in the tail is not something that would require absolute intervention at this point - will need to follow up with her outpatient GI, Dr. 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 And diminutive but patent main portal vein, splenic vein, SMV. -As per Dr. MRush Landmarklarge splenic hematoma. In most cases these will decrease in size over time, but when they do not sometimes evacuation versus drainage may need to be considered (if there is any concern of infection, may need to think about aspiration of that). --Hematoma evacuation is a high risk procedure and I am not sure if the surgeons would do that unless it was increasing in size or other issues.  - no anticoagulation as per GI due to hematoma    3)Biliary Concerns--- CT abdomen and pelvis on 08/15/2021 shows Progressed, Moderate to Severe Hepatic Biliary Ductal Dilatation since last month. And superimposed ductal wall enhancement raises the possibility of Cholangitis. -LFTs are normal show cholangitis less likely monitor LFTs for now - Thus I do think that for now TPN remains the main steadfast for her and if she could tolerate trickle feeds in the distal duodenum via [place] that may make sense for her in an effort of trying to help things heal. But for now not sure there is much more that we would be able to do an endoscopically there is not something we would necessarily pursue right now. If the LFTs started to increase significantly and we believe that cyst was causing that, then an aspiration (not a cystgastrostomy or cystenterostomy) would be the first step  where I do think EUS could be helpful. But again LFTs are normal. Not sure if that is help things or clouded the picture even more.  --Continue IV Rocephin and Flagyl for now for possible cholangitis   -If no SIRS pathophysiology and no LFT elevations over the next 24 to 48 hours may stop antibiotics   4)Hypokalemia -Replaced potassium, magnesium 1.7   5)Essential hypertension -Continue amlodipine   6)Liddle's syndrome -Monitore BP and electrolytes  7)FEN---continue TPN......Marland Kitchenper protocol  Status is: Inpatient   Disposition: The patient is from: Home              Anticipated d/c is to: Home              Anticipated d/c date is: 2 days              Patient currently is not medically stable to d/c. Barriers: Not Clinically Stable-   Code Status :  -  Code Status: Full Code   Family Communication:    NA (patient is alert, awake and coherent)   DVT Prophylaxis  :   - SCDs SCDs Start: 08/15/22 1605 Place TED hose Start: 08/15/22 1605 heparin injection 5,000 Units Start: 08/15/22 1400 SCDs Start: 08/15/22 0751 Place TED hose Start: 08/15/22 0751   Lab Results  Component Value Date   PLT 494 (H) 08/16/2022    Inpatient Medications  Scheduled Meds:  amLODipine  10 mg Oral Daily   Chlorhexidine Gluconate Cloth  6 each Topical Daily   folic acid  1 mg Oral Daily   heparin  5,000 Units Subcutaneous Q8H   insulin aspart  0-9 Units Subcutaneous Q6H   ketorolac  15 mg Intravenous Q8H   lipase/protease/amylase  36,000 Units Oral TID AC   multivitamin with minerals  1 tablet Oral Daily   pantoprazole  40 mg Oral Daily   sodium chloride flush  3 mL Intravenous Q12H   sodium chloride flush  3 mL Intravenous Q12H   sodium chloride flush  3 mL Intravenous Q12H   sodium chloride flush  3 mL Intravenous Q12H   thiamine  100 mg Oral Daily   Continuous Infusions:  sodium chloride     sodium chloride     0.9 % NaCl with KCl 20 mEq / L     cefTRIAXone (ROCEPHIN)  IV 2 g (08/16/22 1250)   magnesium sulfate bolus IVPB     metronidazole 500 mg (08/16/22 1142)   TPN ADULT (ION)     PRN Meds:.sodium chloride, sodium chloride, acetaminophen **OR** acetaminophen, bisacodyl,  HYDROmorphone (DILAUDID) injection, meclizine, ondansetron **OR** ondansetron (ZOFRAN) IV, mouth rinse, oxyCODONE, polyethylene glycol, sodium chloride flush, sodium chloride flush, traZODone   Anti-infectives (From admission, onward)    Start     Dose/Rate Route Frequency Ordered Stop   08/15/22 1300  cefTRIAXone (ROCEPHIN) 2 g in sodium chloride 0.9 % 100 mL IVPB        2 g 200 mL/hr over 30 Minutes Intravenous Every 24 hours 08/15/22 0811     08/15/22 1200  metroNIDAZOLE (FLAGYL) IVPB 500 mg        500 mg 100 mL/hr over 60 Minutes Intravenous Every 12 hours 08/15/22 0811     08/15/22 1000  metroNIDAZOLE (FLAGYL) IVPB 500 mg  Status:  Discontinued        500 mg 100 mL/hr over 60 Minutes Intravenous Every 12 hours 08/15/22 0751 08/15/22 0811   08/15/22 0900  cefTRIAXone (ROCEPHIN) 2 g in sodium chloride 0.9 %  100 mL IVPB  Status:  Discontinued        2 g 200 mL/hr over 30 Minutes Intravenous Every 24 hours 08/15/22 0751 08/15/22 0811   08/15/22 0630  piperacillin-tazobactam (ZOSYN) IVPB 3.375 g  Status:  Discontinued        3.375 g 12.5 mL/hr over 240 Minutes Intravenous  Once 08/15/22 0623 08/15/22 0800         Subjective: [name] today has no fevers, no emesis,  No chest pain,   - 08/16/22- -abdominal pain and nausea persist requiring frequent IV pain - Objective: Vitals:   08/16/22 0451 08/16/22 0823 08/16/22 1126 08/16/22 1329  BP: 95/62 113/83  108/74  Pulse: 77   77  Resp: 20     Temp: 97.9 F (36.6 C)   98 F (36.7 C)  TempSrc: Oral   Oral  SpO2: 91%   93%  Weight:   71.4 kg   Height:        Intake/Output Summary (Last 24 hours) at 08/16/2022 1354 Last data filed at 08/16/2022 0900 Gross per 24 hour  Intake 2963.17 ml  Output --  Net 2963.17 ml   Filed Weights   08/15/22 1659 08/16/22 1126  Weight: 69.6 kg 71.4 kg    Physical Exam Gen:- Awake Alert,  in no apparent distress  HEENT:- [place].AT, No sclera icterus Neck-Supple Neck,No JVD,.  Lungs-   CTAB , fair symmetrical air movement CV- S1, S2 normal, regular  Abd-  +ve B.Sounds, Abd Soft, right upper quadrant periumbilical and left upper quadrant tenderness without rebound or guarding   Extremity/Skin:- No  edema, pedal pulses present  Psych-affect is appropriate, oriented x3 Neuro-no new focal deficits, no tremors  Data Reviewed: I have personally reviewed following labs and imaging studies  CBC: Recent Labs  Lab 08/15/22 0040 08/16/22 0432  WBC 10.1 6.1  HGB 12.0 10.0*  HCT 36.7 32.3*  MCV 85.2 89.2  PLT 567* 132*   Basic Metabolic Panel: Recent Labs  Lab 08/15/22 0040 08/15/22 0045 08/16/22 0432  NA 133*  --  138  K 3.2*  --  3.7  CL 98  --  107  CO2 24  --  24  GLUCOSE 99  --  60*  BUN 10  --  7  CREATININE 0.45  --  0.39*  CALCIUM 8.8*  --  8.0*  MG  --  1.7  --    GFR: Estimated Creatinine Clearance: 91.2 mL/min (A) (by C-G formula based on SCr of 0.39 mg/dL (L)). Liver Function Tests: Recent Labs  Lab 08/15/22 0040 08/16/22 0432  AST 18 20  ALT 12 11  ALKPHOS 92 66  BILITOT 0.9 0.3  PROT 7.9 6.2*  ALBUMIN 2.8* 2.2*   Radiology Studies: CT ABDOMEN PELVIS W CONTRAST  Result Date: 08/15/2022 CLINICAL DATA:  37 year old female with abdominal pain. History of pancreatitis, subcapsular splenic hematoma. EXAM: CT ABDOMEN AND PELVIS WITH CONTRAST TECHNIQUE: Multidetector CT imaging of the abdomen and pelvis was performed using the standard protocol following bolus administration of intravenous contrast. RADIATION DOSE REDUCTION: This exam was performed according to the departmental dose-optimization program which includes automated exposure control, adjustment of the mA and/or kV according to patient size and/or use of iterative reconstruction technique. CONTRAST:  151m OMNIPAQUE IOHEXOL 300 MG/ML  SOLN COMPARISON:  CT Abdomen and Pelvis 07/21/2022 and earlier. FINDINGS: Lower chest: Decreased but not resolved left pleural effusion with associated pleural  thickening and enhancement compatible with organized pleural collection (less likely empyema  given interval decrease). Associated round atelectasis in the left lower lobe. No pericardial effusion. Partially visible SVC vascular catheter. Mild right lung base atelectasis. No right pleural effusion. Hepatobiliary: Heterogeneous liver perfusion and worsening intrahepatic biliary ductal dilatation with increased bile duct wall enhancement and central intrahepatic bile ducts are up to 10 mm diameter now (series 2, image 25). Gallbladder also slightly more distended. And there is a small but increasing porta hepatis fluid collection which is probably a pseudo cyst measuring 19 x 27 x 26 mm now, 2 cm or smaller last month. CBD appears to taper distally. Pancreas: Unresolved peripancreatic inflammation with partial pancreatic atrophy. Organizing but irregular 2.5 to 2.8 cm pseudocyst of the pancreatic tail on series 2, image 34 and coronal image 43. Superimposed pancreatic necrosis of the distal tail (series 2, image 34). Peripancreatic inflammation. Spleen: Large subcapsular splenic fluid collection with mixed density previously thought to be subcapsular hematoma. This is 87 x 125 by 99 mm (AP by transverse by CC) volume estimated at 500 38 mL. Size not significantly changed from last month. Mass effect on the more homogeneously enhancing splenic parenchyma as before. Adrenals/Urinary Tract: Despite regional inflammation, adrenal glands and kidneys remain within normal limits. No delayed renal images. Decompressed urinary bladder. Stomach/Bowel: No dilated large or small bowel loops. Increased free fluid and/or inflammatory stranding in both pericolic gutters since last month. No free air. Mass effect on the stomach secondary to abnormal spleen and other upper abdominal viscera. And small volume of fluid within the stomach. No discrete gastric fluid collection. Duodenum may be mildly inflamed but otherwise normal. No  pneumoperitoneum identified. Vascular/Lymphatic: Some cavernous transformation of the main portal vein is redemonstrated. Diminutive main portal vein caliber, and diminutive junction with the SMV, but the mesenteric portal veins appear to remain patent. Splenic vein also remains patent on series 2, image 33. Superimposed major arterial structures in the abdomen and pelvis remain patent. Subtle aortic atherosclerosis. No lymphadenopathy identified. Reproductive: Within normal limits. Other: Small volume free fluid in the pelvis. Musculoskeletal: No acute osseous abnormality identified. IMPRESSION: 1. Unresolved pancreatic inflammation, with small but enlarging pseudocysts at both the porta hepatis (see also #2 - series 2, image 28) and pancreatic tail (image 34). Superimposed necrosis of the distal tail. Mildly increased from last month dependent fluid and inflammation in the abdominal gutters and pelvis. 2. Progressed, Moderate to Severe Hepatic Biliary Ductal Dilatation since last month. And superimposed ductal wall enhancement raises the possibility of Cholangitis. 3. Large Splenic Subcapsular Hematoma and/or complex collection (538 mL) not significantly changed. Mass effect on the spleen. 4. Organized left pleural collection with pleural thickening at the lung base has regressed but not resolved. Associated left lower lobe atelectasis. 5. 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 And diminutive but patent main portal vein, splenic vein, SMV. Electronically Signed   By: Genevie Ann M.D.   On: 08/15/2022 05:53    Scheduled Meds:  amLODipine  10 mg Oral Daily   Chlorhexidine Gluconate Cloth  6 each Topical Daily   folic acid  1 mg Oral Daily   heparin  5,000 Units Subcutaneous Q8H   insulin aspart  0-9 Units Subcutaneous Q6H   ketorolac  15 mg Intravenous Q8H   lipase/protease/amylase  36,000 Units Oral TID AC   multivitamin with minerals  1 tablet Oral Daily   pantoprazole  40 mg Oral Daily    sodium chloride flush  3 mL Intravenous Q12H   sodium chloride flush  3 mL Intravenous  Q12H   sodium chloride flush  3 mL Intravenous Q12H   sodium chloride flush  3 mL Intravenous Q12H   thiamine  100 mg Oral Daily   Continuous Infusions:  sodium chloride     sodium chloride     0.9 % NaCl with KCl 20 mEq / L     cefTRIAXone (ROCEPHIN)  IV 2 g (08/16/22 1250)   magnesium sulfate bolus IVPB     metronidazole 500 mg (08/16/22 1142)   TPN ADULT (ION)      LOS: 1 day   Roxan Hockey M.D on 08/16/2022 at 1:54 PM  Go to [email or web address] - for contact info  [facility] - Office  [phone]  If 7PM-7AM, please contact night-coverage [email or web address] 08/16/2022, 1:54 PM

## 2022-08-16 NOTE — Progress Notes (Signed)
PHARMACY - TOTAL PARENTERAL NUTRITION CONSULT NOTE   Indication:  Intolerance to enteral feeding /recurrent pancreatitis with necrosis/pseudocyst.  Patient Measurements: Height: '5\' 3"'$  (160 cm) Weight: 69.6 kg (153 lb 8 oz) IBW/kg (Calculated) : 52.4 TPN AdjBW (KG): 56.7 Body mass index is 27.19 kg/m.  Assessment:  Patient on home TPN and MD wants to continue. Patient admitted with pancreatitis again and worsening abdominal pain.TPN was initiated via PICC line due to intolerance to adequate PO intake at last admission 07/2022.    Glucose / Insulin: BS 68 Electrolytes: WNL except Magnesium 1.7 Renal: 0.39 Hepatic: WNL Intake / Output; MIVF: NS + 20KCL/L at 127ms/hr-> will reduce  Central access: PICC placed 07/25/22 TPN start date: 07/25/22   Nutritional Goals: Goal TPN rate is 80 mL/hr (provides 111 g of protein and 1808 kcals per day)   RD Assessment: Estimated Needs Total Energy Estimated Needs: 1800-2000 Total Protein Estimated Needs: 100-115g Total Fluid Estimated Needs: >/=1.8L  Current Nutrition:  Clear liquids  Plan:  Magnesium Sulfate 1gm IV x 1 Start TPN at 867mhr at 1800 Electrolytes in TPN: Na 6051mL, K 74m32m, Ca 5mEq97m Mg 7mEq/33mand Phos 8mmol/32mCl:Ac 1:1 Add standard MVI and trace elements to TPN Initiate Sensitive q6h SSI and adjust as needed  Reduce MIVF to 70 mL/hr at 1800 Monitor TPN labs on Mon/Thurs, Labs in AM  [name] Kallman PIsac Sarnaarm D, BCPS Clinical Pharmacist 08/16/2022,11:15 AM

## 2022-08-16 NOTE — Progress Notes (Signed)
Gastroenterology Progress Note   Referring Provider: No ref. provider found Primary Care Physician:  [name], MD Primary Gastroenterologist: Dr. Marius Ditch  Patient ID: Jillian Shepherd; [id number]; 1985/12/21   Subjective:    Patient is frustrated and discouraged. She has been sick since the end of September with pancreatitis. This is her fourth admission. She denies etoh use since onset of her symptoms. This admission, she states she has new RUQ pain/Right shoulder pain. Continues to have epigastric pain. When she takes a deep breath she has sharp pain in the Left upper abdomen/left flank. She continues to move gas/bowels. She denies vomiting. She is taking sips of clear liquids without increase in pain. Remains on TPN. She feels like her stomach is swollen/distended. No melena, brbpr. She states she is refusing heparin because of her history of splenic hematoma after starting Eliquis for SV thrombosis in 06/2022.   Objective:   Vital signs in last 24 hours: Temp:  [97.6 F (36.4 C)-98.4 F (36.9 C)] 97.9 F (36.6 C) (01/11 0451) Pulse Rate:  [69-83] 77 (01/11 0451) Resp:  [16-20] 20 (01/11 0451) BP: (95-127)/(62-86) 95/62 (01/11 0451) SpO2:  [91 %-100 %] 91 % (01/11 0451) Weight:  [69.6 kg] 69.6 kg (01/10 1659) Last BM Date : 08/14/22 General:   Alert,  Well-developed, well-nourished, pleasant and cooperative in NAD Head:  Normocephalic and atraumatic. Eyes:  Sclera clear, no icterus.  Abdomen:  Soft, mild distention. Moderate epigastric/ruq tenderness, mild luq tenderness. Normal bowel sounds, without guarding, and without rebound.   Extremities:  Without clubbing, deformity. Trace bilateral edema. Neurologic:  Alert and  oriented x4;  grossly normal neurologically. Psych:  Alert and cooperative. Flat affect.  Intake/Output from previous day: 01/10 0701 - 01/11 0700 In: 2603.2 [I.V.:2312.5; IV Piggyback:290.7] Out: -  Intake/Output this shift: No intake/output data  recorded.  Lab Results: CBC Recent Labs    08/15/22 0040 08/16/22 0432  WBC 10.1 6.1  HGB 12.0 10.0*  HCT 36.7 32.3*  MCV 85.2 89.2  PLT 567* 494*   BMET Recent Labs    08/15/22 0040 08/16/22 0432  NA 133* 138  K 3.2* 3.7  CL 98 107  CO2 24 24  GLUCOSE 99 60*  BUN 10 7  CREATININE 0.45 0.39*  CALCIUM 8.8* 8.0*   LFTs Recent Labs    08/15/22 0040 08/16/22 0432  BILITOT 0.9 0.3  ALKPHOS 92 66  AST 18 20  ALT 12 11  PROT 7.9 6.2*  ALBUMIN 2.8* 2.2*   Recent Labs    08/15/22 0040  LIPASE 115*   PT/INR Recent Labs    08/16/22 0432  LABPROT 14.4  INR 1.1         Imaging Studies: CT ABDOMEN PELVIS W CONTRAST  Result Date: 08/15/2022 CLINICAL DATA:  37 year old female with abdominal pain. History of pancreatitis, subcapsular splenic hematoma. EXAM: CT ABDOMEN AND PELVIS WITH CONTRAST TECHNIQUE: Multidetector CT imaging of the abdomen and pelvis was performed using the standard protocol following bolus administration of intravenous contrast. RADIATION DOSE REDUCTION: This exam was performed according to the departmental dose-optimization program which includes automated exposure control, adjustment of the mA and/or kV according to patient size and/or use of iterative reconstruction technique. CONTRAST:  165m OMNIPAQUE IOHEXOL 300 MG/ML  SOLN COMPARISON:  CT Abdomen and Pelvis 07/21/2022 and earlier. FINDINGS: Lower chest: Decreased but not resolved left pleural effusion with associated pleural thickening and enhancement compatible with organized pleural collection (less likely empyema given interval decrease). Associated round atelectasis in  the left lower lobe. No pericardial effusion. Partially visible SVC vascular catheter. Mild right lung base atelectasis. No right pleural effusion. Hepatobiliary: Heterogeneous liver perfusion and worsening intrahepatic biliary ductal dilatation with increased bile duct wall enhancement and central intrahepatic bile ducts are up  to 10 mm diameter now (series 2, image 25). Gallbladder also slightly more distended. And there is a small but increasing porta hepatis fluid collection which is probably a pseudo cyst measuring 19 x 27 x 26 mm now, 2 cm or smaller last month. CBD appears to taper distally. Pancreas: Unresolved peripancreatic inflammation with partial pancreatic atrophy. Organizing but irregular 2.5 to 2.8 cm pseudocyst of the pancreatic tail on series 2, image 34 and coronal image 43. Superimposed pancreatic necrosis of the distal tail (series 2, image 34). Peripancreatic inflammation. Spleen: Large subcapsular splenic fluid collection with mixed density previously thought to be subcapsular hematoma. This is 87 x 125 by 99 mm (AP by transverse by CC) volume estimated at 500 38 mL. Size not significantly changed from last month. Mass effect on the more homogeneously enhancing splenic parenchyma as before. Adrenals/Urinary Tract: Despite regional inflammation, adrenal glands and kidneys remain within normal limits. No delayed renal images. Decompressed urinary bladder. Stomach/Bowel: No dilated large or small bowel loops. Increased free fluid and/or inflammatory stranding in both pericolic gutters since last month. No free air. Mass effect on the stomach secondary to abnormal spleen and other upper abdominal viscera. And small volume of fluid within the stomach. No discrete gastric fluid collection. Duodenum may be mildly inflamed but otherwise normal. No pneumoperitoneum identified. Vascular/Lymphatic: Some cavernous transformation of the main portal vein is redemonstrated. Diminutive main portal vein caliber, and diminutive junction with the SMV, but the mesenteric portal veins appear to remain patent. Splenic vein also remains patent on series 2, image 33. Superimposed major arterial structures in the abdomen and pelvis remain patent. Subtle aortic atherosclerosis. No lymphadenopathy identified. Reproductive: Within normal limits.  Other: Small volume free fluid in the pelvis. Musculoskeletal: No acute osseous abnormality identified. IMPRESSION: 1. Unresolved pancreatic inflammation, with small but enlarging pseudocysts at both the porta hepatis (see also #2 - series 2, image 28) and pancreatic tail (image 34). Superimposed necrosis of the distal tail. Mildly increased from last month dependent fluid and inflammation in the abdominal gutters and pelvis. 2. Progressed, Moderate to Severe Hepatic Biliary Ductal Dilatation since last month. And superimposed ductal wall enhancement raises the possibility of Cholangitis. 3. Large Splenic Subcapsular Hematoma and/or complex collection (538 mL) not significantly changed. Mass effect on the spleen. 4. Organized left pleural collection with pleural thickening at the lung base has regressed but not resolved. Associated left lower lobe atelectasis. 5. 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 And diminutive but patent main portal vein, splenic vein, SMV. Electronically Signed   By: Genevie Ann M.D.   On: 08/15/2022 05:53   [place] EKG SITE RITE  Result Date: 07/24/2022 If Site Rite image not attached, placement could not be confirmed due to current cardiac rhythm.  CT ABDOMEN PELVIS W CONTRAST  Result Date: 07/21/2022 CLINICAL DATA:  Follow-up acute pancreatitis and splenic hematoma. Worsening abdominal pain for several days. Nausea and vomiting. EXAM: CT ABDOMEN AND PELVIS WITH CONTRAST TECHNIQUE: Multidetector CT imaging of the abdomen and pelvis was performed using the standard protocol following bolus administration of intravenous contrast. RADIATION DOSE REDUCTION: This exam was performed according to the departmental dose-optimization program which includes automated exposure control, adjustment of the mA and/or kV according to patient  size and/or use of iterative reconstruction technique. CONTRAST:  149m OMNIPAQUE IOHEXOL 300 MG/ML  SOLN COMPARISON:  07/08/2022 FINDINGS: Lower Chest:  Small left pleural effusion and left lower lobe atelectasis have decreased since prior study. Hepatobiliary: A 7 mm hypervascular lesion is seen in the posterior liver dome. This remains stable in retrospect compared to previous study, likely representing a flash-filling hemangioma. Gallbladder is unremarkable. No evidence of biliary ductal dilatation. Pancreas: Peripancreatic edema is again seen surrounding the pancreatic body and tail, consistent with acute pancreatitis. A 3.5 x 1.9 cm fluid collection is seen adjacent to the pancreatic tail, mildly increased from 2.2 x 1.9 cm previously. This is consistent with a pancreatic pseudocyst. No evidence of pancreatic ductal dilatation. Spleen: A large subcapsular hematoma is again seen along the lateral aspect of the spleen, which is slightly decreased in size. This currently measures 13.7 x 9.7 cm, compared to 14.3 x 10.5 cm previously. Adrenals/Urinary Tract: No suspicious masses identified. No evidence of ureteral calculi or hydronephrosis. Stomach/Bowel: No evidence of obstruction, inflammatory process or abnormal fluid collections. Normal appendix visualized. Vascular/Lymphatic: 12 mm lymph node in the porta hepatis remains stable, likely reactive in etiology. Thrombosis of the proximal splenic vein is again seen with venous collaterals in the left upper quadrant. Nonocclusive thrombus again seen throughout the portal veins, which is increased in the left portal vein since previous study. Reproductive:  No mass or other significant abnormality. Other:  None. Musculoskeletal:  No suspicious bone lesions identified. IMPRESSION: Moderate acute pancreatitis, without significant change. Mild increase in size of 3.5 cm pancreatic pseudocyst adjacent to the pancreatic tail. Mild decrease in size of large subcapsular splenic hematoma. 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 Nonocclusive thrombus throughout the portal  veins, which is increased in the left portal vein since previous study. Decreased small left pleural effusion and left lower lobe atelectasis. Stable 7 mm hypervascular lesion in posterior liver dome, likely representing a flash-filling hemangioma. Recommend continued attention on follow-up imaging. Electronically Signed   By: JMarlaine HindM.D.   On: 07/21/2022 14:28   [place]  Result Date: 07/21/2022 CLINICAL DATA:  Abdominal pain. Known acute pancreatitis, splenic hematoma, and pancreatic pseudocyst. Technologist indicates limited study due to patient pain tolerance, bowel gas, and limited mobility. EXAM: ABDOMEN ULTRASOUND COMPLETE COMPARISON:  CT abdomen and pelvis July 08, 2022 FINDINGS: Gallbladder: Layering echogenic material within the gallbladder, likely sludge. No gallstones or wall thickening visualized. Positive sonographic Murphy sign noted by the sonographer, however limited assessment in the setting acute pancreatitis. Common bile duct: Diameter: 7 mm Liver: Increased and heterogeneous hepatic parenchymal echogenicity. There is also a more focal masslike echogenic area in the left hepatic lobe that measures 5.4 x 5.3 x 5.1 cm. Near-occlusive thrombus in the main portal vein. IVC: No abnormality visualized. Pancreas: Limited visualization secondary to overlying bowel gas. The known pancreatic pseudocyst is not well seen. Spleen: Heterogeneous perisplenic fluid collection that measures approximately 11.3 x 6.1 x 7.0 cm. Right Kidney: Length: 12.2 cm. Echogenicity within normal limits. No mass or hydronephrosis visualized. Left Kidney: Length: 11.9 cm. Echogenicity within normal limits. No mass or hydronephrosis visualized. Abdominal aorta: No aneurysm visualized. Other findings: None. IMPRESSION: 1. Large perisplenic hematoma, better assessed on recent CT. 2. Near-occlusive thrombus in the main portal vein. 3. There is a 5.4 cm focal masslike echogenic area in the left hepatic lobe,  incompletely assessed on ultrasound. Differential considerations include differential hepatic perfusion in the  setting of portal vein thrombus, hematoma, or focal mass. Recommend further assessment with liver MRI with and without contrast. 4. Gallbladder sludge without sonographic evidence of acute cholecystitis. The mildly dilated common bile duct and positive sonographic Murphy sign noted by the sonographer is favored to be related to known acute pancreatitis. Electronically Signed   By: Beryle Flock M.D.   On: 07/21/2022 12:32  [2 weeks]  Assessment:   Jillian Shepherd is a 37 y.o. 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 Well-known to GI with recent hospitalization for recurrent pancreatitis. This is her fourth admission since 05/2022.     Recurrent pancreatitis with necrosis/pseudocyst: last admission in December 2023 when TPN was initiated via PICC line due to intolerance to adequate PO intake. She was D/C'd on 12/22. Worsening abdominal pain over the weekend. New RUQ/Right shoulder pain. No fevers, but having cold sweats and nausea. Tolerating some POs at home to include water, jello broth and fruits, did not feel that PO intake made pain worse.    Case discussed with advanced endoscopist Dr. Early Osmond with [facility] who reviewed the case and does not feel that pseudocyst or necrosis requires immediate intervention at this time. There was concern for cholangitis on the CT, though LFTs remain WNL, low likelihood of cholangitis given this. ERCP likely would not be very beneficial at this time, as potential that pseudocyst/fluid collection is causing some narrowing of bile duct, if LFTs start to increase and this is thought secondary to  pseudocyst EUS for drainage may then be warranted.   LFTs remain normal. She states pain is manageable on Dilaudid. She is frustrated with showing know significant improvement. Will continue TPN for now.   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 Mass effect on the spleen and Partial cavernous transformation of the portal vein, diminutive but patent main portal vein, splenic vein, SMV. As above, case was reviewed by advanced endoscopist with [facility], Dr. Rush Landmark advised in most cases these will decrease in size over time, if this does not/becomes larger, may require evacuation vs drainage, if there is any concern for infection, may need to consider aspiration, however evacuation of hematoma Is high risk procedure and would not be indicated unless this is becoming more of an issue.   Trend H/H, monitor clinically. If signficant drops in hgb would require repeat CT imaging for further evaluation of hematoma. ACs for thrombus/thrombosis contraindicated at this time due to hematoma. Discussed with Dr. Roxan Hockey, prophylactic heparin order due to risk of DVT, patient has been refusing due to her concerns regarding splenic hematoma.   Anemia: Hgb 12 at admission (at discharge 07/27/22 was 9.9), back down to baseline of 10 today.     Plan:   Supportive measures.  Continue TPN.  Trend H/H.  Avoid even minor abdominal wall trauma. If pain worsens or drop in H/H would consider repeat CT imaging.  Continue to follow LFTs.  Continue clear liquids for now.    LOS: 1 day   Laureen Ochs. Bernarda Caffey [facility] [phone] 1/11/20248:28 AM

## 2022-08-17 DIAGNOSIS — K859 Acute pancreatitis without necrosis or infection, unspecified: Secondary | ICD-10-CM | POA: Diagnosis not present

## 2022-08-17 LAB — CBC WITH DIFFERENTIAL/PLATELET
Abs Immature Granulocytes: 0.01 10*3/uL (ref 0.00–0.07)
Basophils Absolute: 0 10*3/uL (ref 0.0–0.1)
Basophils Relative: 1 %
Eosinophils Absolute: 0.4 10*3/uL (ref 0.0–0.5)
Eosinophils Relative: 7 %
HCT: 31.9 % — ABNORMAL LOW (ref 36.0–46.0)
Hemoglobin: 9.9 g/dL — ABNORMAL LOW (ref 12.0–15.0)
Immature Granulocytes: 0 %
Lymphocytes Relative: 34 %
Lymphs Abs: 2.1 10*3/uL (ref 0.7–4.0)
MCH: 27.5 pg (ref 26.0–34.0)
MCHC: 31 g/dL (ref 30.0–36.0)
MCV: 88.6 fL (ref 80.0–100.0)
Monocytes Absolute: 0.4 10*3/uL (ref 0.1–1.0)
Monocytes Relative: 6 %
Neutro Abs: 3.2 10*3/uL (ref 1.7–7.7)
Neutrophils Relative %: 52 %
Platelets: 475 10*3/uL — ABNORMAL HIGH (ref 150–400)
RBC: 3.6 MIL/uL — ABNORMAL LOW (ref 3.87–5.11)
RDW: 17 % — ABNORMAL HIGH (ref 11.5–15.5)
WBC: 6.1 10*3/uL (ref 4.0–10.5)
nRBC: 0 % (ref 0.0–0.2)

## 2022-08-17 LAB — COMPREHENSIVE METABOLIC PANEL
ALT: 9 U/L (ref 0–44)
AST: 17 U/L (ref 15–41)
Albumin: 2.2 g/dL — ABNORMAL LOW (ref 3.5–5.0)
Alkaline Phosphatase: 67 U/L (ref 38–126)
Anion gap: 5 (ref 5–15)
BUN: 8 mg/dL (ref 6–20)
CO2: 27 mmol/L (ref 22–32)
Calcium: 8 mg/dL — ABNORMAL LOW (ref 8.9–10.3)
Chloride: 104 mmol/L (ref 98–111)
Creatinine, Ser: 0.35 mg/dL — ABNORMAL LOW (ref 0.44–1.00)
GFR, Estimated: >60 mL/min (ref >60)
Glucose, Bld: 137 mg/dL — ABNORMAL HIGH (ref 70–99)
Potassium: 3.7 mmol/L (ref 3.5–5.1)
Sodium: 136 mmol/L (ref 135–145)
Total Bilirubin: <0.1 mg/dL — ABNORMAL LOW (ref 0.3–1.2)
Total Protein: 6.2 g/dL — ABNORMAL LOW (ref 6.5–8.1)

## 2022-08-17 LAB — GLUCOSE, CAPILLARY
Glucose-Capillary: 111 mg/dL — ABNORMAL HIGH (ref 70–99)
Glucose-Capillary: 115 mg/dL — ABNORMAL HIGH (ref 70–99)
Glucose-Capillary: 120 mg/dL — ABNORMAL HIGH (ref 70–99)
Glucose-Capillary: 136 mg/dL — ABNORMAL HIGH (ref 70–99)
Glucose-Capillary: 141 mg/dL — ABNORMAL HIGH (ref 70–99)

## 2022-08-17 LAB — PHOSPHORUS: Phosphorus: 3.1 mg/dL (ref 2.5–4.6)

## 2022-08-17 LAB — TRIGLYCERIDES: Triglycerides: 93 mg/dL (ref <150)

## 2022-08-17 LAB — MAGNESIUM: Magnesium: 1.9 mg/dL (ref 1.7–2.4)

## 2022-08-17 MED ORDER — TRAZODONE HCL 50 MG PO TABS
150.0000 mg | ORAL_TABLET | Freq: Every day | ORAL | Status: DC
  Administered 2022-08-17 – 2022-08-22 (×6): 150 mg via ORAL
  Filled 2022-08-17 (×6): qty 3

## 2022-08-17 MED ORDER — TRAVASOL 10 % IV SOLN
INTRAVENOUS | Status: AC
  Filled 2022-08-17: qty 1113.6

## 2022-08-17 MED ORDER — POTASSIUM CHLORIDE CRYS ER 20 MEQ PO TBCR
20.0000 meq | EXTENDED_RELEASE_TABLET | Freq: Once | ORAL | Status: AC
  Administered 2022-08-17: 20 meq via ORAL
  Filled 2022-08-17: qty 1

## 2022-08-17 NOTE — Progress Notes (Signed)
Gastroenterology Progress Note   Referring Provider: No ref. provider found Primary Care Physician:  [name], MD Primary Gastroenterologist: Dr. Marius Ditch  Patient ID: Jillian Shepherd; [id number]; 12-06-1985    Subjective   Pain unchanged.  No worsening however no significant improvement.  Continues to have intermittent nausea as well as gas.  Also with complaints of sharp pains at times with yawning and deep breaths.  Able to tolerate small volume of clear liquids.  Primarily has been drinking water and apple juice.  Ate Jell-O and chicken broth this morning.  Had a bowel movement this morning, primarily loose.  She reports frequent urination given she is not taking clear liquids but reports urine is dark and has a foul smell.  Continues to report frustrations about lack of improvement.  Objective   Vital signs in last 24 hours Temp:  [98 F (36.7 C)-98.1 F (36.7 C)] 98.1 F (36.7 C) (01/12 0332) Pulse Rate:  [73-78] 73 (01/12 0811) Resp:  [18] 18 (01/12 0332) BP: (86-108)/(50-76) 101/66 (01/12 0811) SpO2:  [92 %-100 %] 92 % (01/12 0332) Weight:  [71.4 kg] 71.4 kg (01/11 1126) Last BM Date : 08/16/22  Physical Exam General:   Alert and oriented, pleasant Head:  Normocephalic and atraumatic. Eyes:  No icterus, sclera clear. Conjuctiva pink.  Abdomen:  Bowel sounds present, soft, non-distended. TTP to RUQ and epigastrium. No HSM or hernias noted. No rebound or guarding. No masses appreciated  Msk:  Symmetrical without gross deformities. Normal posture. Extremities:  Without clubbing or edema. Neurologic:  Alert and  oriented x4;  grossly normal neurologically. Psych:  Alert and cooperative. Normal mood and affect.  Intake/Output from previous day: 01/11 0701 - 01/12 0700 In: 3165.6 [P.O.:800; I.V.:1965.6; IV Piggyback:400] Out: 2200 [Urine:2200] Intake/Output this shift: No intake/output data recorded.  Lab Results  Recent Labs    08/15/22 0040 08/16/22 0432  08/17/22 0430  WBC 10.1 6.1 6.1  HGB 12.0 10.0* 9.9*  HCT 36.7 32.3* 31.9*  PLT 567* 494* 475*   BMET Recent Labs    08/15/22 0040 08/16/22 0432 08/17/22 0430  NA 133* 138 136  K 3.2* 3.7 3.7  CL 98 107 104  CO2 '24 24 27  '$ GLUCOSE 99 60* 137*  BUN '10 7 8  '$ CREATININE 0.45 0.39* 0.35*  CALCIUM 8.8* 8.0* 8.0*   LFT Recent Labs    08/15/22 0040 08/16/22 0432 08/17/22 0430  PROT 7.9 6.2* 6.2*  ALBUMIN 2.8* 2.2* 2.2*  AST '18 20 17  '$ ALT '12 11 9  '$ ALKPHOS 92 66 67  BILITOT 0.9 0.3 <0.1*   PT/INR Recent Labs    08/16/22 0432  LABPROT 14.4  INR 1.1   Hepatitis Panel No results for input(s): "HEPBSAG", "HCVAB", "HEPAIGM", "HEPBIGM" in the last 72 hours.   Studies/Results CT ABDOMEN PELVIS W CONTRAST  Result Date: 08/15/2022 CLINICAL DATA:  37 year old female with abdominal pain. History of pancreatitis, subcapsular splenic hematoma. EXAM: CT ABDOMEN AND PELVIS WITH CONTRAST TECHNIQUE: Multidetector CT imaging of the abdomen and pelvis was performed using the standard protocol following bolus administration of intravenous contrast. RADIATION DOSE REDUCTION: This exam was performed according to the departmental dose-optimization program which includes automated exposure control, adjustment of the mA and/or kV according to patient size and/or use of iterative reconstruction technique. CONTRAST:  146m OMNIPAQUE IOHEXOL 300 MG/ML  SOLN COMPARISON:  CT Abdomen and Pelvis 07/21/2022 and earlier. FINDINGS: Lower chest: Decreased but not resolved left pleural effusion with associated pleural thickening and enhancement compatible  with organized pleural collection (less likely empyema given interval decrease). Associated round atelectasis in the left lower lobe. No pericardial effusion. Partially visible SVC vascular catheter. Mild right lung base atelectasis. No right pleural effusion. Hepatobiliary: Heterogeneous liver perfusion and worsening intrahepatic biliary ductal dilatation with  increased bile duct wall enhancement and central intrahepatic bile ducts are up to 10 mm diameter now (series 2, image 25). Gallbladder also slightly more distended. And there is a small but increasing porta hepatis fluid collection which is probably a pseudo cyst measuring 19 x 27 x 26 mm now, 2 cm or smaller last month. CBD appears to taper distally. Pancreas: Unresolved peripancreatic inflammation with partial pancreatic atrophy. Organizing but irregular 2.5 to 2.8 cm pseudocyst of the pancreatic tail on series 2, image 34 and coronal image 43. Superimposed pancreatic necrosis of the distal tail (series 2, image 34). Peripancreatic inflammation. Spleen: Large subcapsular splenic fluid collection with mixed density previously thought to be subcapsular hematoma. This is 87 x 125 by 99 mm (AP by transverse by CC) volume estimated at 500 38 mL. Size not significantly changed from last month. Mass effect on the more homogeneously enhancing splenic parenchyma as before. Adrenals/Urinary Tract: Despite regional inflammation, adrenal glands and kidneys remain within normal limits. No delayed renal images. Decompressed urinary bladder. Stomach/Bowel: No dilated large or small bowel loops. Increased free fluid and/or inflammatory stranding in both pericolic gutters since last month. No free air. Mass effect on the stomach secondary to abnormal spleen and other upper abdominal viscera. And small volume of fluid within the stomach. No discrete gastric fluid collection. Duodenum may be mildly inflamed but otherwise normal. No pneumoperitoneum identified. Vascular/Lymphatic: Some cavernous transformation of the main portal vein is redemonstrated. Diminutive main portal vein caliber, and diminutive junction with the SMV, but the mesenteric portal veins appear to remain patent. Splenic vein also remains patent on series 2, image 33. Superimposed major arterial structures in the abdomen and pelvis remain patent. Subtle aortic  atherosclerosis. No lymphadenopathy identified. Reproductive: Within normal limits. Other: Small volume free fluid in the pelvis. Musculoskeletal: No acute osseous abnormality identified. IMPRESSION: 1. Unresolved pancreatic inflammation, with small but enlarging pseudocysts at both the porta hepatis (see also #2 - series 2, image 28) and pancreatic tail (image 34). Superimposed necrosis of the distal tail. Mildly increased from last month dependent fluid and inflammation in the abdominal gutters and pelvis. 2. Progressed, Moderate to Severe Hepatic Biliary Ductal Dilatation since last month. And superimposed ductal wall enhancement raises the possibility of Cholangitis. 3. Large Splenic Subcapsular Hematoma and/or complex collection (538 mL) not significantly changed. Mass effect on the spleen. 4. Organized left pleural collection with pleural thickening at the lung base has regressed but not resolved. Associated left lower lobe atelectasis. 5. 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 And diminutive but patent main portal vein, splenic vein, SMV. Electronically Signed   By: Genevie Ann M.D.   On: 08/15/2022 05:53   [place] EKG SITE RITE  Result Date: 07/24/2022 If Site Rite image not attached, placement could not be confirmed due to current cardiac rhythm.  CT ABDOMEN PELVIS W CONTRAST  Result Date: 07/21/2022 CLINICAL DATA:  Follow-up acute pancreatitis and splenic hematoma. Worsening abdominal pain for several days. Nausea and vomiting. EXAM: CT ABDOMEN AND PELVIS WITH CONTRAST TECHNIQUE: Multidetector CT imaging of the abdomen and pelvis was performed using the standard protocol following bolus administration of intravenous contrast. RADIATION DOSE REDUCTION: This exam was performed according to the departmental dose-optimization program  which includes automated exposure control, adjustment of the mA and/or kV according to patient size and/or use of iterative reconstruction technique. CONTRAST:   142m OMNIPAQUE IOHEXOL 300 MG/ML  SOLN COMPARISON:  07/08/2022 FINDINGS: Lower Chest: Small left pleural effusion and left lower lobe atelectasis have decreased since prior study. Hepatobiliary: A 7 mm hypervascular lesion is seen in the posterior liver dome. This remains stable in retrospect compared to previous study, likely representing a flash-filling hemangioma. Gallbladder is unremarkable. No evidence of biliary ductal dilatation. Pancreas: Peripancreatic edema is again seen surrounding the pancreatic body and tail, consistent with acute pancreatitis. A 3.5 x 1.9 cm fluid collection is seen adjacent to the pancreatic tail, mildly increased from 2.2 x 1.9 cm previously. This is consistent with a pancreatic pseudocyst. No evidence of pancreatic ductal dilatation. Spleen: A large subcapsular hematoma is again seen along the lateral aspect of the spleen, which is slightly decreased in size. This currently measures 13.7 x 9.7 cm, compared to 14.3 x 10.5 cm previously. Adrenals/Urinary Tract: No suspicious masses identified. No evidence of ureteral calculi or hydronephrosis. Stomach/Bowel: No evidence of obstruction, inflammatory process or abnormal fluid collections. Normal appendix visualized. Vascular/Lymphatic: 12 mm lymph node in the porta hepatis remains stable, likely reactive in etiology. Thrombosis of the proximal splenic vein is again seen with venous collaterals in the left upper quadrant. Nonocclusive thrombus again seen throughout the portal veins, which is increased in the left portal vein since previous study. Reproductive:  No mass or other significant abnormality. Other:  None. Musculoskeletal:  No suspicious bone lesions identified. IMPRESSION: Moderate acute pancreatitis, without significant change. Mild increase in size of 3.5 cm pancreatic pseudocyst adjacent to the pancreatic tail. Mild decrease in size of large subcapsular splenic hematoma. Stable thrombosis of the proximal splenic vein, with  venous collaterals in left upper quadrant. Nonocclusive thrombus throughout the portal veins, which is increased in the left portal vein since previous study. Decreased small left pleural effusion and left lower lobe atelectasis. Stable 7 mm hypervascular lesion in posterior liver dome, likely representing a flash-filling hemangioma. Recommend continued attention on follow-up imaging. Electronically Signed   By: JMarlaine HindM.D.   On: 07/21/2022 14:28   [place]  Result Date: 07/21/2022 CLINICAL DATA:  Abdominal pain. Known acute pancreatitis, splenic hematoma, and pancreatic pseudocyst. Technologist indicates limited study due to patient pain tolerance, bowel gas, and limited mobility. EXAM: ABDOMEN ULTRASOUND COMPLETE COMPARISON:  CT abdomen and pelvis July 08, 2022 FINDINGS: Gallbladder: Layering echogenic material within the gallbladder, likely sludge. No gallstones or wall thickening visualized. Positive sonographic Murphy sign noted by the sonographer, however limited assessment in the setting acute pancreatitis. Common bile duct: Diameter: 7 mm Liver: Increased and heterogeneous hepatic parenchymal echogenicity. There is also a more focal masslike echogenic area in the left hepatic lobe that measures 5.4 x 5.3 x 5.1 cm. Near-occlusive thrombus in the main portal vein. IVC: No abnormality visualized. Pancreas: Limited visualization secondary to overlying bowel gas. The known pancreatic pseudocyst is not well seen. Spleen: Heterogeneous perisplenic fluid collection that measures approximately 11.3 x 6.1 x 7.0 cm. Right Kidney: Length: 12.2 cm. Echogenicity within normal limits. No mass or hydronephrosis visualized. Left Kidney: Length: 11.9 cm. Echogenicity within normal limits. No mass or hydronephrosis visualized. Abdominal aorta: No aneurysm visualized. Other findings: None. IMPRESSION: 1. Large perisplenic hematoma, better assessed on recent CT. 2. Near-occlusive thrombus in the main  portal vein. 3. There is a 5.4 cm focal masslike echogenic area in the left  hepatic lobe, incompletely assessed on ultrasound. Differential considerations include differential hepatic perfusion in the setting of portal vein thrombus, hematoma, or focal mass. Recommend further assessment with liver MRI with and without contrast. 4. Gallbladder sludge without sonographic evidence of acute cholecystitis. The mildly dilated common bile duct and positive sonographic Murphy sign noted by the sonographer is favored to be related to known acute pancreatitis. Electronically Signed   By: Beryle Flock M.D.   On: 07/21/2022 12:32    Assessment  37 y.o. 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  Well-known to GI service with recent hospitalization for recurrent pancreatitis.  She has had 4 admissions since October 2023.  Recurrent pancreatitis with necrosis/pseudocyst: During prior admission in December 2023 she was initiated on TPN via PICC line due to intolerance to adequate p.o. intake.  Discharged 07/27/2022.  She developed worsening abdominal pain over the New Year's weekend to right upper quadrant/right shoulder, having cold sweats and nausea.  Was tolerating some p.o. intake at home with clear liquids and some fruit as well as self administration of TPN 18 hours a day.  Pain unrelated to p.o. intake.  Her case was discussed earlier this week with Dr. Rush Landmark with [facility] who reviewed her case and does not feel that pseudocyst or necrosis requires immediate intervention at this time.  There was some initial concern for cholangitis on CT however her LFTs remain within normal limits.  ERCP likely would not be very beneficial at this time as potential pseudocyst/fluid  collection is causing some narrowing of the bile duct.  If LFTs start to increase and this is thought to be secondary to pseudocyst then EUS for drainage may be warranted.  Pain is currently manageable with Dilaudid.  Patient has expressed that she is in no rush for discharge as she just wants to begin feeling better and does continue to express frustration regarding lack of improvement.  We discussed at length today regarding some of her current symptoms including why she developed right upper quadrant pain, why deep breaths have been slightly painful, and while she has occasional spasms.  She expressed gratitude for education provided today and is willing to participate in being a little more mobile.  Nonocclusive thrombus of portal vein/splenic vein thrombosis/splenic hematoma: Large splenic subscapular hematoma and/or complex collection, not significantly changed from previous imaging.  Mass effect on the spleen and partial cavernous transformation of the portal vein, diminutive but patent main portal vein, splenic vein, SMV.  As stated above her case was reviewed by [facility] Dr. Rush Landmark who stated most cases these will decrease in size over time, if there is any enlargement she may require evacuation versus drainage or if there is any concern for infection she may need aspiration.  Evacuation of the hematoma is high risk procedure and would not be indicated unless this becomes more of an issue.  Plan to trend H/H and monitor clinically.  If significant drops in hemoglobin then she would require repeat CT imaging for further evaluation of hematoma.  Anticoagulation is contraindicated at this time due to her hematoma.  Patient continuing to refuse subcutaneous heparin injection.  She is wearing TED hose and SCDs.  As stated above more frequent mobilization encouraged and she is agreeable.  Anemia: Hemoglobin 12 admission, was 9.9 on recent discharge.  Hemoglobin noted to be 10 yesterday and stable at 9.9  today.  Plan / Recommendations  Supportive measures Continue TPN Trend H&H If worsening drop in H/H will consider repeat CT imaging Advise increasing mobility, ensuring to avoid abdominal wall trauma Continue to monitor LFTs, if elevation in LFTs would recommend RUQ [place] Continue clear liquids today, may consider increasing diet very slowly with small volumes    LOS: 2 days    08/17/2022, 9:10 AM   Venetia Night, MSN, FNP-BC, AGACNP-BC [facility]

## 2022-08-17 NOTE — Progress Notes (Signed)
PROGRESS NOTE     Jillian Shepherd, is a 37 y.o. female, DOB - Oct 13, 1985, [id number]  Admit date - 08/14/2022   Admitting Physician Sharmayne Jablon Denton Brick, MD  Outpatient Primary MD for the patient is Pickard, Cammie Mcgee, MD  LOS - 2  Chief Complaint  Patient presents with   Abdominal Pain        Brief summary -37 y.o. female  with PMH Liddle syndrome, obesity, alcoholic pancreatitis/chronic pancreatitis with pseudocyst formation with recurrent admissions for same  -Patient readmitted on 08/15/2022 with abdominal pain consistent with recurrent/acute on chronic pancreatitis --On 08/15/2022 had Conference with Dr. Gala Romney Linna Hoff) as well as Dr. Rush Landmark and  Dr. Lyndel Safe from [facility]--recommendations as outlined in the assessment and plan section of the admission H&P  A/p 1)Acute on chronic Pancreatitis Pseudocyst formation -Repeat CT abdomen and pelvis from 08/23/2022 shows  Unresolved pancreatic inflammation, with small but enlarging pseudocysts at both the porta hepatis and pancreatic tail with Superimposed necrosis of the distal tail. Mildly increased from last month dependent fluid and inflammation in the abdominal gutters and pelvis. --Also has mild increase of 3.5 cm pancreatic pseudocyst adjacent to pancreatic tail -GI following, appreciate assistance.  Patient has been unable to tolerate adequate oral nutrition and was recommended for initiation of TPN - PICC placed on 07/25/2022 and TPN has been started with plans for continuing TPN at home and slow diet advancement -Discussed with Dr. Rush Landmark from GI service who states pseudocyst in the pancreas tail/necrosis or possible necrosis in the tail is not something that would require absolute intervention at this point - will need to follow up with her outpatient GI, Dr. 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 And diminutive but patent main portal vein, splenic vein, SMV. -As per Dr. MRush Landmarklarge splenic hematoma. In most cases these will decrease in size over time, but when they do not sometimes evacuation versus drainage may need to be considered (if there is any concern of infection, may need to think about aspiration of that). --Hematoma evacuation is a high risk procedure and I am not sure if the surgeons would do that unless it was increasing in size or other issues.  - no anticoagulation as per GI due to hematoma    3)Biliary Concerns--- CT abdomen and pelvis on 08/15/2021 shows Progressed, Moderate to Severe Hepatic Biliary Ductal Dilatation since last month. And superimposed ductal wall enhancement raises the possibility of Cholangitis. -LFTs are normal show cholangitis less likely monitor LFTs for now - Thus I do think that for now TPN remains the main steadfast for her and if she could tolerate trickle feeds in the distal duodenum via Dobbhoff that may make sense for her in an effort of trying to help things heal. But for now not sure there is much more that we would be able to do an endoscopically there is not something we would necessarily pursue right now. If the LFTs started to increase significantly and we believe that cyst was causing that, then an aspiration (not a cystgastrostomy or cystenterostomy) would be the first step where I do think EUS  could be helpful. But again LFTs are normal. Not sure if that is help things or clouded the picture even more.  --Continue IV Rocephin and Flagyl for now for possible cholangitis  -If no SIRS  pathophysiology and no LFT elevations over the next 24 to 48 hours may stop antibiotics   4)Hypokalemia -Replaced potassium, magnesium 1.9   5)Essential hypertension -Continue amlodipine   6)Liddle's syndrome -Monitore BP and electrolytes  7)FEN---continue TPN......Marland Kitchenper protocol  Status is: Inpatient   Disposition: The patient is from: Home              Anticipated d/c is to: Home              Anticipated d/c date is: 2 days              Patient currently is not medically stable to d/c. Barriers: Not Clinically Stable-   Code Status :  -  Code Status: Full Code   Family Communication:    NA (patient is alert, awake and coherent)   DVT Prophylaxis  :   - SCDs Place and maintain sequential compression device Start: 08/17/22 1431 Place TED hose Start: 08/17/22 1431 SCDs Start: 08/15/22 1605 Place TED hose Start: 08/15/22 1605 heparin injection 5,000 Units Start: 08/15/22 1400   Lab Results  Component Value Date   PLT 475 (H) 08/17/2022    Inpatient Medications  Scheduled Meds:  amLODipine  10 mg Oral Daily   Chlorhexidine Gluconate Cloth  6 each Topical Daily   folic acid  1 mg Oral Daily   heparin  5,000 Units Subcutaneous Q8H   insulin aspart  0-9 Units Subcutaneous Q6H   lipase/protease/amylase  36,000 Units Oral TID AC   multivitamin with minerals  1 tablet Oral Daily   pantoprazole  40 mg Oral Daily   sodium chloride flush  3 mL Intravenous Q12H   sodium chloride flush  3 mL Intravenous Q12H   thiamine  100 mg Oral Daily   Continuous Infusions:  sodium chloride     sodium chloride     cefTRIAXone (ROCEPHIN)  IV 200 mL/hr at 08/17/22 1805   metronidazole Stopped (08/17/22 1323)   TPN ADULT (ION) 80 mL/hr at 08/17/22 1805   PRN Meds:.sodium chloride, sodium chloride, acetaminophen **OR** acetaminophen, bisacodyl, HYDROmorphone (DILAUDID) injection, meclizine, ondansetron **OR** ondansetron (ZOFRAN) IV, mouth rinse, oxyCODONE, polyethylene glycol, sodium  chloride flush, sodium chloride flush, traZODone   Anti-infectives (From admission, onward)    Start     Dose/Rate Route Frequency Ordered Stop   08/15/22 1300  cefTRIAXone (ROCEPHIN) 2 g in sodium chloride 0.9 % 100 mL IVPB        2 g 200 mL/hr over 30 Minutes Intravenous Every 24 hours 08/15/22 0811     08/15/22 1200  metroNIDAZOLE (FLAGYL) IVPB 500 mg        500 mg 100 mL/hr over 60 Minutes Intravenous Every 12 hours 08/15/22 0811     08/15/22 1000  metroNIDAZOLE (FLAGYL) IVPB 500 mg  Status:  Discontinued        500 mg 100 mL/hr over 60 Minutes Intravenous Every 12 hours 08/15/22 0751 08/15/22 0811   08/15/22 0900  cefTRIAXone (ROCEPHIN) 2 g in sodium chloride 0.9 % 100 mL IVPB  Status:  Discontinued        2 g 200 mL/hr over 30 Minutes Intravenous Every 24 hours 08/15/22 0751 08/15/22 0811   08/15/22 0630  piperacillin-tazobactam (ZOSYN) IVPB 3.375 g  Status:  Discontinued  3.375 g 12.5 mL/hr over 240 Minutes Intravenous  Once 08/15/22 0623 08/15/22 0800         Subjective: Jillian Shepherd today has no fevers,  ,  No chest pain,   - 08/17/22- -pain control remains challenging -Had BMs -Unable to tolerate significant oral intake -Had nausea but no significant emesis - Objective: Vitals:   08/16/22 2031 08/17/22 0332 08/17/22 0811 08/17/22 1329  BP: 105/76 (!) 86/50 101/66 112/73  Pulse: 78 74 73 82  Resp: '18 18  18  '$ Temp: 98.1 F (36.7 C) 98.1 F (36.7 C)  98.5 F (36.9 C)  TempSrc:    Oral  SpO2: 100% 92%  100%  Weight:      Height:        Intake/Output Summary (Last 24 hours) at 08/17/2022 2018 Last data filed at 08/17/2022 1837 Gross per 24 hour  Intake 4243.89 ml  Output 1500 ml  Net 2743.89 ml   Filed Weights   08/15/22 1659 08/16/22 1126  Weight: 69.6 kg 71.4 kg    Physical Exam Gen:- Awake Alert,  in no apparent distress  HEENT:- [place].AT, No sclera icterus Neck-Supple Neck,No JVD,.  Lungs-  CTAB , fair symmetrical air movement CV- S1, S2  normal, regular  Abd-  +ve B.Sounds, Abd Soft, right upper quadrant periumbilical and left upper quadrant tenderness without rebound or guarding   Extremity/Skin:- No  edema, pedal pulses present  Psych-affect is appropriate, oriented x3 Neuro-no new focal deficits, no tremors  Data Reviewed: I have personally reviewed following labs and imaging studies  CBC: Recent Labs  Lab 08/15/22 0040 08/16/22 0432 08/17/22 0430  WBC 10.1 6.1 6.1  NEUTROABS  --   --  3.2  HGB 12.0 10.0* 9.9*  HCT 36.7 32.3* 31.9*  MCV 85.2 89.2 88.6  PLT 567* 494* 299*   Basic Metabolic Panel: Recent Labs  Lab 08/15/22 0040 08/15/22 0045 08/16/22 0432 08/17/22 0430  NA 133*  --  138 136  K 3.2*  --  3.7 3.7  CL 98  --  107 104  CO2 24  --  24 27  GLUCOSE 99  --  60* 137*  BUN 10  --  7 8  CREATININE 0.45  --  0.39* 0.35*  CALCIUM 8.8*  --  8.0* 8.0*  MG  --  1.7  --  1.9  PHOS  --   --   --  3.1   GFR: Estimated Creatinine Clearance: 91.2 mL/min (A) (by C-G formula based on SCr of 0.35 mg/dL (L)). Liver Function Tests: Recent Labs  Lab 08/15/22 0040 08/16/22 0432 08/17/22 0430  AST '18 20 17  '$ ALT '12 11 9  '$ ALKPHOS 92 66 67  BILITOT 0.9 0.3 <0.1*  PROT 7.9 6.2* 6.2*  ALBUMIN 2.8* 2.2* 2.2*   Radiology Studies: No results found.  Scheduled Meds:  amLODipine  10 mg Oral Daily   Chlorhexidine Gluconate Cloth  6 each Topical Daily   folic acid  1 mg Oral Daily   heparin  5,000 Units Subcutaneous Q8H   insulin aspart  0-9 Units Subcutaneous Q6H   lipase/protease/amylase  36,000 Units Oral TID AC   multivitamin with minerals  1 tablet Oral Daily   pantoprazole  40 mg Oral Daily   sodium chloride flush  3 mL Intravenous Q12H   sodium chloride flush  3 mL Intravenous Q12H   thiamine  100 mg Oral Daily   Continuous Infusions:  sodium chloride     sodium chloride  cefTRIAXone (ROCEPHIN)  IV 200 mL/hr at 08/17/22 1805   metronidazole Stopped (08/17/22 1323)   TPN ADULT (ION) 80  mL/hr at 08/17/22 1805    LOS: 2 days   Roxan Hockey M.D on 08/17/2022 at 8:18 PM  Go to [email or web address] - for contact info  [facility] - Office  [phone]  If 7PM-7AM, please contact night-coverage [email or web address] 08/17/2022, 8:18 PM

## 2022-08-17 NOTE — Progress Notes (Addendum)
PHARMACY - TOTAL PARENTERAL NUTRITION CONSULT NOTE   Indication:  Intolerance to enteral feeding /recurrent pancreatitis with necrosis/pseudocyst.  Patient Measurements: Height: '5\' 3"'$  (160 cm) Weight: 71.4 kg (157 lb 6.5 oz) IBW/kg (Calculated) : 52.4 TPN AdjBW (KG): 56.7 Body mass index is 27.88 kg/m.  Assessment:  Patient on home TPN and MD wants to continue. Patient admitted with pancreatitis again and worsening abdominal pain.TPN was initiated via PICC line due to intolerance to adequate PO intake at last admission 07/25/22.    Glucose / Insulin: BS 66-141- 1 unit given Electrolytes: K 3.7  Na 136 Renal: 0.39 Hepatic: Albumin 2.2 Intake / Output; MIVF: D51/2NS w/ K   @ 75 mL/hr  Central access: PICC placed 07/25/22 TPN start date: 07/25/22   Nutritional Goals: Goal TPN rate is 80 mL/hr (provides 111 g of protein and 1808 kcals per day)   RD Assessment: Estimated Needs Total Energy Estimated Needs: 1800-2000 Total Protein Estimated Needs: 100-115g Total Fluid Estimated Needs: >/=1.8L  Current Nutrition:  Clear liquids  Plan:  Potassium chloride 20 mEq PO x 1  Continue TPN at 62m/hr at 1800 Electrolytes in TPN: Na 769m/L, K 4050mL, Ca 5mE66m, Mg 7mEq52m and Phos 8mmol89m Cl:Ac 1:1 Add standard MVI and trace elements to TPN Continue Sensitive q6h SSI and adjust as needed  Stop D51/2NS w/ potassium Monitor TPN labs on Mon/Thurs, Labs in AM  StevenMargot AblesmD Clinical Pharmacist 08/17/2022 8:56 AM

## 2022-08-17 NOTE — Progress Notes (Signed)
Patient is currently resting in her bed at this time. Patient has been given pain medication approximately every two hours during this shift. Patient refused 2200 and 0600 heparin. MD aware from previous.

## 2022-08-18 DIAGNOSIS — K859 Acute pancreatitis without necrosis or infection, unspecified: Secondary | ICD-10-CM | POA: Diagnosis not present

## 2022-08-18 LAB — COMPREHENSIVE METABOLIC PANEL
ALT: 8 U/L (ref 0–44)
AST: 15 U/L (ref 15–41)
Albumin: 2.3 g/dL — ABNORMAL LOW (ref 3.5–5.0)
Alkaline Phosphatase: 70 U/L (ref 38–126)
Anion gap: 6 (ref 5–15)
BUN: 11 mg/dL (ref 6–20)
CO2: 27 mmol/L (ref 22–32)
Calcium: 8.3 mg/dL — ABNORMAL LOW (ref 8.9–10.3)
Chloride: 103 mmol/L (ref 98–111)
Creatinine, Ser: 0.35 mg/dL — ABNORMAL LOW (ref 0.44–1.00)
GFR, Estimated: >60 mL/min (ref >60)
Glucose, Bld: 109 mg/dL — ABNORMAL HIGH (ref 70–99)
Potassium: 4.2 mmol/L (ref 3.5–5.1)
Sodium: 136 mmol/L (ref 135–145)
Total Bilirubin: 0.3 mg/dL (ref 0.3–1.2)
Total Protein: 6.6 g/dL (ref 6.5–8.1)

## 2022-08-18 LAB — GLUCOSE, CAPILLARY
Glucose-Capillary: 119 mg/dL — ABNORMAL HIGH (ref 70–99)
Glucose-Capillary: 152 mg/dL — ABNORMAL HIGH (ref 70–99)
Glucose-Capillary: 157 mg/dL — ABNORMAL HIGH (ref 70–99)

## 2022-08-18 MED ORDER — TRAVASOL 10 % IV SOLN
INTRAVENOUS | Status: AC
  Filled 2022-08-18: qty 1113.6

## 2022-08-18 MED ORDER — INSULIN ASPART 100 UNIT/ML IJ SOLN
0.0000 [IU] | Freq: Three times a day (TID) | INTRAMUSCULAR | Status: DC
  Administered 2022-08-18 – 2022-08-19 (×2): 2 [IU] via SUBCUTANEOUS
  Administered 2022-08-19 (×2): 1 [IU] via SUBCUTANEOUS
  Administered 2022-08-20 – 2022-08-23 (×11): 2 [IU] via SUBCUTANEOUS

## 2022-08-18 NOTE — Progress Notes (Signed)
PROGRESS NOTE     Jillian Shepherd, is a 37 y.o. female, DOB - Sep 25, 1985, [id number]  Admit date - 08/14/2022   Admitting Physician Michelangelo Rindfleisch Denton Brick, MD  Outpatient Primary MD for the patient is [name], MD  LOS - 3  Chief Complaint  Patient presents with   Abdominal Pain        Brief summary -37 y.o. female  with PMH Liddle syndrome, obesity, alcoholic pancreatitis/chronic pancreatitis with pseudocyst formation with recurrent admissions for same  -Patient readmitted on 08/15/2022 with abdominal pain consistent with recurrent/acute on chronic pancreatitis --On 08/15/2022 had Conference with Dr. Gala Romney Linna Hoff) as well as Dr. Rush Landmark and  Dr. Lyndel Safe from [facility]--recommendations as outlined in the assessment and plan section of the admission H&P  A/p 1)Acute on chronic Pancreatitis Pseudocyst formation -Repeat CT abdomen and pelvis from 08/23/2022 shows  Unresolved pancreatic inflammation, with small but enlarging pseudocysts at both the porta hepatis and pancreatic tail with Superimposed necrosis of the distal tail. Mildly increased from last month dependent fluid and inflammation in the abdominal gutters and pelvis. --Also has mild increase of 3.5 cm pancreatic pseudocyst adjacent to pancreatic tail -GI following, appreciate assistance.  Patient has been unable to tolerate adequate oral nutrition and was recommended for initiation of TPN - PICC placed on 07/25/2022 and TPN has been started with plans for continuing TPN at home and slow diet advancement -Discussed with Dr. Rush Landmark from GI service who states pseudocyst in the pancreas tail/necrosis or possible necrosis in the tail is not something that would require absolute intervention at this point - will need to follow up with her outpatient GI, Dr. 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 And diminutive but patent main portal vein, splenic vein, SMV. -As per Dr. MRush Landmarklarge splenic hematoma. In most cases these will decrease in size over time, but when they do not sometimes evacuation versus drainage may need to be considered (if there is any concern of infection, may need to think about aspiration of that). --Hematoma evacuation is a high risk procedure and I am not sure if the surgeons would do that unless it was increasing in size or other issues.  - no anticoagulation as per GI due to hematoma    3)Biliary Concerns--- CT abdomen and pelvis on 08/15/2021 shows Progressed, Moderate to Severe Hepatic Biliary Ductal Dilatation since last month. And superimposed ductal wall enhancement raises the possibility of Cholangitis. -LFTs are normal show cholangitis less likely monitor LFTs for now - Thus I do think that for now TPN remains the main steadfast for her and if she could tolerate trickle feeds in the distal duodenum via Dobbhoff that may make sense for her in an effort of trying to help things heal. But for now not sure there is much more that we would be able to do an endoscopically there is not something we would necessarily pursue right now. If the LFTs started to increase significantly and we believe that cyst was causing that, then an aspiration (not a cystgastrostomy or cystenterostomy) would be the first step where I do think EUS  could be helpful. But again LFTs are normal. Not sure if that is help things or clouded the picture even more.  --Continue IV Rocephin and Flagyl for now for possible cholangitis  -If no SIRS  pathophysiology and no LFT elevations over the next 24 to 48 hours may stop antibiotics   4)Hypokalemia -Replaced potassium, magnesium 1.9   5)Essential hypertension -Continue amlodipine   6)Liddle's syndrome -Monitore BP and electrolytes  7)FEN---continue TPN......Marland Kitchenper protocol  Status is: Inpatient   Disposition: The patient is from: Home              Anticipated d/c is to: Home              Anticipated d/c date is: 2 days              Patient currently is not medically stable to d/c. Barriers: Not Clinically Stable-   Code Status :  -  Code Status: Full Code   Family Communication:    NA (patient is alert, awake and coherent)   DVT Prophylaxis  :   - SCDs Place and maintain sequential compression device Start: 08/17/22 1431 Place TED hose Start: 08/17/22 1431 SCDs Start: 08/15/22 1605 Place TED hose Start: 08/15/22 1605 heparin injection 5,000 Units Start: 08/15/22 1400   Lab Results  Component Value Date   PLT 475 (H) 08/17/2022    Inpatient Medications  Scheduled Meds:  amLODipine  10 mg Oral Daily   Chlorhexidine Gluconate Cloth  6 each Topical Daily   folic acid  1 mg Oral Daily   heparin  5,000 Units Subcutaneous Q8H   insulin aspart  0-9 Units Subcutaneous Q8H   lipase/protease/amylase  36,000 Units Oral TID AC   multivitamin with minerals  1 tablet Oral Daily   pantoprazole  40 mg Oral Daily   sodium chloride flush  3 mL Intravenous Q12H   sodium chloride flush  3 mL Intravenous Q12H   thiamine  100 mg Oral Daily   traZODone  150 mg Oral QHS   Continuous Infusions:  sodium chloride     sodium chloride     cefTRIAXone (ROCEPHIN)  IV 200 mL/hr at 08/18/22 1716   metronidazole 100 mL/hr at 08/18/22 1716   TPN ADULT (ION) 80 mL/hr at 08/18/22 1716   PRN Meds:.sodium chloride, sodium chloride, acetaminophen **OR** acetaminophen, bisacodyl, HYDROmorphone (DILAUDID) injection, meclizine, ondansetron **OR** ondansetron (ZOFRAN) IV, mouth rinse, oxyCODONE,  polyethylene glycol, sodium chloride flush, sodium chloride flush, traZODone   Anti-infectives (From admission, onward)    Start     Dose/Rate Route Frequency Ordered Stop   08/15/22 1300  cefTRIAXone (ROCEPHIN) 2 g in sodium chloride 0.9 % 100 mL IVPB        2 g 200 mL/hr over 30 Minutes Intravenous Every 24 hours 08/15/22 0811     08/15/22 1200  metroNIDAZOLE (FLAGYL) IVPB 500 mg        500 mg 100 mL/hr over 60 Minutes Intravenous Every 12 hours 08/15/22 0811     08/15/22 1000  metroNIDAZOLE (FLAGYL) IVPB 500 mg  Status:  Discontinued        500 mg 100 mL/hr over 60 Minutes Intravenous Every 12 hours 08/15/22 0751 08/15/22 0811   08/15/22 0900  cefTRIAXone (ROCEPHIN) 2 g in sodium chloride 0.9 % 100 mL IVPB  Status:  Discontinued        2 g 200 mL/hr over 30 Minutes Intravenous Every 24 hours 08/15/22 0751 08/15/22 0811   08/15/22 0630  piperacillin-tazobactam (ZOSYN)  IVPB 3.375 g  Status:  Discontinued        3.375 g 12.5 mL/hr over 240 Minutes Intravenous  Once 08/15/22 0623 08/15/22 0800         Subjective: [name] today has no fevers,  ,  No chest pain,   - 08/18/22- -pain control remains challenging Patient is getting frustrated about persistent abdominal pain---continues to request IV Dilaudid at very frequent intervals Trazodone helped with sleep - Objective: Vitals:   08/17/22 1329 08/17/22 2040 08/18/22 0452 08/18/22 1430  BP: 112/73 (!) 108/55 106/71 106/73  Pulse: 82 76 78 72  Resp: '18 18 17 18  '$ Temp: 98.5 F (36.9 C) (!) 97.2 F (36.2 C) 97.8 F (36.6 C) 98.9 F (37.2 C)  TempSrc: Oral  Oral Oral  SpO2: 100% 93% 100% 93%  Weight:      Height:        Intake/Output Summary (Last 24 hours) at 08/18/2022 1934 Last data filed at 08/18/2022 1816 Gross per 24 hour  Intake 3544.58 ml  Output 600 ml  Net 2944.58 ml   Filed Weights   08/15/22 1659 08/16/22 1126  Weight: 69.6 kg 71.4 kg    Physical Exam Gen:- Awake Alert,  in no apparent  distress  HEENT:- [place].AT, No sclera icterus Neck-Supple Neck,No JVD,.  Lungs-  CTAB , fair symmetrical air movement CV- S1, S2 normal, regular  Abd-  +ve B.Sounds, Abd Soft, right upper quadrant periumbilical and left upper quadrant tenderness without rebound or guarding   Extremity/Skin:- No  edema, pedal pulses present  Psych-affect is appropriate, oriented x3 Neuro-no new focal deficits, no tremors  Data Reviewed: I have personally reviewed following labs and imaging studies  CBC: Recent Labs  Lab 08/15/22 0040 08/16/22 0432 08/17/22 0430  WBC 10.1 6.1 6.1  NEUTROABS  --   --  3.2  HGB 12.0 10.0* 9.9*  HCT 36.7 32.3* 31.9*  MCV 85.2 89.2 88.6  PLT 567* 494* 876*   Basic Metabolic Panel: Recent Labs  Lab 08/15/22 0040 08/15/22 0045 08/16/22 0432 08/17/22 0430 08/18/22 0351  NA 133*  --  138 136 136  K 3.2*  --  3.7 3.7 4.2  CL 98  --  107 104 103  CO2 24  --  '24 27 27  '$ GLUCOSE 99  --  60* 137* 109*  BUN 10  --  '7 8 11  '$ CREATININE 0.45  --  0.39* 0.35* 0.35*  CALCIUM 8.8*  --  8.0* 8.0* 8.3*  MG  --  1.7  --  1.9  --   PHOS  --   --   --  3.1  --    GFR: Estimated Creatinine Clearance: 91.2 mL/min (A) (by C-G formula based on SCr of 0.35 mg/dL (L)). Liver Function Tests: Recent Labs  Lab 08/15/22 0040 08/16/22 0432 08/17/22 0430 08/18/22 0351  AST '18 20 17 15  '$ ALT '12 11 9 8  '$ ALKPHOS 92 66 67 70  BILITOT 0.9 0.3 <0.1* 0.3  PROT 7.9 6.2* 6.2* 6.6  ALBUMIN 2.8* 2.2* 2.2* 2.3*   Radiology Studies: No results found.  Scheduled Meds:  amLODipine  10 mg Oral Daily   Chlorhexidine Gluconate Cloth  6 each Topical Daily   folic acid  1 mg Oral Daily   heparin  5,000 Units Subcutaneous Q8H   insulin aspart  0-9 Units Subcutaneous Q8H   lipase/protease/amylase  36,000 Units Oral TID AC   multivitamin with minerals  1 tablet Oral Daily  pantoprazole  40 mg Oral Daily   sodium chloride flush  3 mL Intravenous Q12H   sodium chloride flush  3 mL Intravenous  Q12H   thiamine  100 mg Oral Daily   traZODone  150 mg Oral QHS   Continuous Infusions:  sodium chloride     sodium chloride     cefTRIAXone (ROCEPHIN)  IV 200 mL/hr at 08/18/22 1716   metronidazole 100 mL/hr at 08/18/22 1716   TPN ADULT (ION) 80 mL/hr at 08/18/22 1716    LOS: 3 days   Roxan Hockey M.D on 08/18/2022 at 7:34 PM  Go to [email or web address] - for contact info  [facility] - Office  [phone]  If 7PM-7AM, please contact night-coverage [email or web address] 08/18/2022, 7:34 PM

## 2022-08-18 NOTE — Progress Notes (Signed)
PHARMACY - TOTAL PARENTERAL NUTRITION CONSULT NOTE   Indication:  Intolerance to enteral feeding /recurrent pancreatitis with necrosis/pseudocyst.  Patient Measurements: Height: '5\' 3"'$  (160 cm) Weight: 71.4 kg (157 lb 6.5 oz) IBW/kg (Calculated) : 52.4 TPN AdjBW (KG): 56.7 Body mass index is 27.88 kg/m.  Assessment:  Patient on home TPN and MD wants to continue. Patient admitted with pancreatitis again and worsening abdominal pain.TPN was initiated via PICC line due to intolerance to adequate PO intake at last admission 07/25/22.    Glucose / Insulin: BS 109-136- 1 unit given Electrolytes: WNL Renal: 0.39 Hepatic: Albumin 2.2   Central access: PICC placed 07/25/22 TPN start date: 07/25/22   Nutritional Goals: Goal TPN rate is 80 mL/hr (provides 111 g of protein and 1808 kcals per day)   RD Assessment: Estimated Needs Total Energy Estimated Needs: 1800-2000 Total Protein Estimated Needs: 100-115g Total Fluid Estimated Needs: >/=1.8L  Current Nutrition:  Clear liquids  Plan:   Continue TPN at 16m/hr at 1800 Electrolytes in TPN: Na 727m/L, K 5050mL, Ca 5mE37m, Mg 7mEq97m and Phos 8mmol54m Cl:Ac 1:1 Add standard MVI and trace elements to TPN Continue Sensitive q8h SSI and adjust as needed  Monitor TPN labs on Mon/Thurs, Labs in AM  StevenMargot AblesmD Clinical Pharmacist 08/18/2022 8:43 AM

## 2022-08-19 DIAGNOSIS — R1013 Epigastric pain: Secondary | ICD-10-CM | POA: Diagnosis present

## 2022-08-19 DIAGNOSIS — K863 Pseudocyst of pancreas: Secondary | ICD-10-CM

## 2022-08-19 DIAGNOSIS — K859 Acute pancreatitis without necrosis or infection, unspecified: Secondary | ICD-10-CM | POA: Diagnosis not present

## 2022-08-19 LAB — GLUCOSE, CAPILLARY
Glucose-Capillary: 175 mg/dL — ABNORMAL HIGH (ref 70–99)
Glucose-Capillary: 148 mg/dL — ABNORMAL HIGH (ref 70–99)
Glucose-Capillary: 142 mg/dL — ABNORMAL HIGH (ref 70–99)

## 2022-08-19 MED ORDER — HYDROMORPHONE HCL 1 MG/ML IJ SOLN
0.5000 mg | INTRAMUSCULAR | Status: DC | PRN
  Administered 2022-08-19 – 2022-08-21 (×14): 0.5 mg via INTRAVENOUS
  Filled 2022-08-19 (×14): qty 0.5

## 2022-08-19 MED ORDER — HYDROMORPHONE HCL 1 MG/ML IJ SOLN
0.5000 mg | Freq: Once | INTRAMUSCULAR | Status: AC
  Administered 2022-08-19: 0.5 mg via INTRAVENOUS
  Filled 2022-08-19: qty 0.5

## 2022-08-19 MED ORDER — TRAVASOL 10 % IV SOLN
INTRAVENOUS | Status: AC
  Filled 2022-08-19: qty 1113.6

## 2022-08-19 NOTE — Progress Notes (Signed)
PROGRESS NOTE     Jillian Shepherd, is a 37 y.o. female, DOB - 1986/06/23, OQH:[id number]  Admit date - 08/14/2022   Admitting Physician Shamere Campas Denton Brick, MD  Outpatient Primary MD for the patient is [name], MD  LOS - 4  Chief Complaint  Patient presents with   Abdominal Pain        Brief summary -37 y.o. female  with PMH Liddle syndrome, obesity, alcoholic pancreatitis/chronic pancreatitis with pseudocyst formation with recurrent admissions for same  -Patient readmitted on 08/15/2022 with abdominal pain consistent with recurrent/acute on chronic pancreatitis --On 08/15/2022 had Conference with Dr. Gala Romney Linna Hoff) as well as Dr. Rush Landmark and  Dr. Lyndel Safe from [facility] GI--recommendations as outlined in the assessment and plan section of the admission H&P  A/p 1)Acute on chronic Pancreatitis Pseudocyst formation -Repeat CT abdomen and pelvis from 08/23/2022 shows  Unresolved pancreatic inflammation, with small but enlarging pseudocysts at both the porta hepatis and pancreatic tail with Superimposed necrosis of the distal tail. Mildly increased from last month dependent fluid and inflammation in the abdominal gutters and pelvis. --Also has mild increase of 3.5 cm pancreatic pseudocyst adjacent to pancreatic tail -GI following, appreciate assistance.  Patient has been unable to tolerate adequate oral nutrition and was recommended for initiation of TPN - PICC placed on 07/25/2022 and TPN has been started with plans for continuing TPN at home and slow diet advancement -Discussed with Dr. Rush Landmark from GI service who states pseudocyst in the pancreas tail/necrosis or possible necrosis in the tail is not something that would require absolute intervention at this point - will need to follow up with her outpatient GI, Dr. 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 And diminutive but patent main portal vein, splenic vein, SMV. -As per Dr. MRush Landmarklarge splenic hematoma. In most cases these will decrease in size over time, but when they do not sometimes evacuation versus drainage may need to be considered (if there is any concern of infection, may need to think about aspiration of that). --Hematoma evacuation is a high risk procedure and I am not sure if the surgeons would do that unless it was increasing in size or other issues.  - no anticoagulation as per GI due to hematoma    3)Biliary Concerns--- CT abdomen and pelvis on 08/15/2021 shows Progressed, Moderate to Severe Hepatic Biliary Ductal Dilatation since last month. And superimposed ductal wall enhancement raises the possibility of Cholangitis. -LFTs are normal show cholangitis less likely monitor LFTs for now - Thus I do think that for now TPN remains the main steadfast for her and if she could tolerate trickle feeds in the distal duodenum via [place] that may make sense for her in an effort of trying to help things heal. But for now not sure there is much more that we would be able to do an endoscopically there is not something we would necessarily pursue right now. If the LFTs started to increase significantly and we believe that cyst was causing that, then an aspiration (not a cystgastrostomy or cystenterostomy) would be the  first step where I do think EUS could be helpful. But again LFTs are normal. Not sure if that is help things or clouded the picture even more.  --Continue IV Rocephin and Flagyl for now for possible  cholangitis  08/19/22 -Okay to stop Rocephin and Flagyl after dose on 08/19/2022 as clinically and from a lab standpoint cholangitis seems less likely---- -continue to monitor LFTs   4)Hypokalemia -Replaced potassium, magnesium 1.9   5)Essential hypertension -Continue amlodipine   6)Liddle's syndrome -Monitore BP and electrolytes  7)FEN---continue TPN......Marland Kitchenper protocol  Status is: Inpatient   Disposition: The patient is from: Home              Anticipated d/c is to: Home              Anticipated d/c date is: 2 days              Patient currently is not medically stable to d/c. Barriers: Not Clinically Stable-   Code Status :  -  Code Status: Full Code   Family Communication:    NA (patient is alert, awake and coherent)   DVT Prophylaxis  :   - SCDs Place and maintain sequential compression device Start: 08/17/22 1431 Place TED hose Start: 08/17/22 1431 SCDs Start: 08/15/22 1605 Place TED hose Start: 08/15/22 1605 heparin injection 5,000 Units Start: 08/15/22 1400   Lab Results  Component Value Date   PLT 475 (H) 08/17/2022    Inpatient Medications  Scheduled Meds:  amLODipine  10 mg Oral Daily   Chlorhexidine Gluconate Cloth  6 each Topical Daily   folic acid  1 mg Oral Daily   heparin  5,000 Units Subcutaneous Q8H   insulin aspart  0-9 Units Subcutaneous Q8H   lipase/protease/amylase  36,000 Units Oral TID AC   multivitamin with minerals  1 tablet Oral Daily   pantoprazole  40 mg Oral Daily   sodium chloride flush  3 mL Intravenous Q12H   sodium chloride flush  3 mL Intravenous Q12H   thiamine  100 mg Oral Daily   traZODone  150 mg Oral QHS   Continuous Infusions:  sodium chloride     sodium chloride     cefTRIAXone (ROCEPHIN)  IV 200 mL/hr at 08/18/22 1716   metronidazole 500 mg (08/19/22 1208)   TPN ADULT (ION) 80 mL/hr at 08/18/22 1716   TPN ADULT (ION)     PRN Meds:.sodium chloride, sodium chloride, acetaminophen **OR** acetaminophen, bisacodyl,  HYDROmorphone (DILAUDID) injection, meclizine, ondansetron **OR** ondansetron (ZOFRAN) IV, mouth rinse, oxyCODONE, sodium chloride flush, sodium chloride flush   Anti-infectives (From admission, onward)    Start     Dose/Rate Route Frequency Ordered Stop   08/15/22 1300  cefTRIAXone (ROCEPHIN) 2 g in sodium chloride 0.9 % 100 mL IVPB        2 g 200 mL/hr over 30 Minutes Intravenous Every 24 hours 08/15/22 0811     08/15/22 1200  metroNIDAZOLE (FLAGYL) IVPB 500 mg        500 mg 100 mL/hr over 60 Minutes Intravenous Every 12 hours 08/15/22 0811     08/15/22 1000  metroNIDAZOLE (FLAGYL) IVPB 500 mg  Status:  Discontinued        500 mg 100 mL/hr over 60 Minutes Intravenous Every 12 hours 08/15/22 0751 08/15/22 0811   08/15/22 0900  cefTRIAXone (ROCEPHIN) 2 g in sodium chloride 0.9 % 100 mL IVPB  Status:  Discontinued        2 g 200 mL/hr  over 30 Minutes Intravenous Every 24 hours 08/15/22 0751 08/15/22 0811   08/15/22 0630  piperacillin-tazobactam (ZOSYN) IVPB 3.375 g  Status:  Discontinued        3.375 g 12.5 mL/hr over 240 Minutes Intravenous  Once 08/15/22 0623 08/15/22 0800       Subjective: Jillian Shepherd today has no fevers,  ,  No chest pain,   - 08/19/22- -tolerating clear liquid diet, she is  willing to try advancing diet -Continues to require frequent IV Dilaudid for abdominal pain --Nausea but no emesis -Stools are mushy - Objective: Vitals:   08/18/22 0452 08/18/22 1430 08/18/22 2026 08/19/22 0422  BP: 106/71 106/73 114/71 108/65  Pulse: 78 72 79 74  Resp: '17 18 16 18  '$ Temp: 97.8 F (36.6 C) 98.9 F (37.2 C) (!) 97.1 F (36.2 C) 98.9 F (37.2 C)  TempSrc: Oral Oral    SpO2: 100% 93% 94% 100%  Weight:      Height:        Intake/Output Summary (Last 24 hours) at 08/19/2022 1241 Last data filed at 08/19/2022 0600 Gross per 24 hour  Intake 4423.29 ml  Output --  Net 4423.29 ml   Filed Weights   08/15/22 1659 08/16/22 1126  Weight: 69.6 kg 71.4 kg     Physical Exam Gen:- Awake Alert,  in no apparent distress  HEENT:- [place].AT, No sclera icterus Neck-Supple Neck,No JVD,.  Lungs-  CTAB , fair symmetrical air movement CV- S1, S2 normal, regular  Abd-  +ve B.Sounds, Abd Soft, right upper quadrant periumbilical and left upper quadrant tenderness without rebound or guarding   Extremity/Skin:- No  edema, pedal pulses present  Psych-affect is appropriate, oriented x3 Neuro-no new focal deficits, no tremors  Data Reviewed: I have personally reviewed following labs and imaging studies  CBC: Recent Labs  Lab 08/15/22 0040 08/16/22 0432 08/17/22 0430  WBC 10.1 6.1 6.1  NEUTROABS  --   --  3.2  HGB 12.0 10.0* 9.9*  HCT 36.7 32.3* 31.9*  MCV 85.2 89.2 88.6  PLT 567* 494* 161*   Basic Metabolic Panel: Recent Labs  Lab 08/15/22 0040 08/15/22 0045 08/16/22 0432 08/17/22 0430 08/18/22 0351  NA 133*  --  138 136 136  K 3.2*  --  3.7 3.7 4.2  CL 98  --  107 104 103  CO2 24  --  '24 27 27  '$ GLUCOSE 99  --  60* 137* 109*  BUN 10  --  '7 8 11  '$ CREATININE 0.45  --  0.39* 0.35* 0.35*  CALCIUM 8.8*  --  8.0* 8.0* 8.3*  MG  --  1.7  --  1.9  --   PHOS  --   --   --  3.1  --    GFR: Estimated Creatinine Clearance: 91.2 mL/min (A) (by C-G formula based on SCr of 0.35 mg/dL (L)). Liver Function Tests: Recent Labs  Lab 08/15/22 0040 08/16/22 0432 08/17/22 0430 08/18/22 0351  AST '18 20 17 15  '$ ALT '12 11 9 8  '$ ALKPHOS 92 66 67 70  BILITOT 0.9 0.3 <0.1* 0.3  PROT 7.9 6.2* 6.2* 6.6  ALBUMIN 2.8* 2.2* 2.2* 2.3*   Radiology Studies: No results found.  Scheduled Meds:  amLODipine  10 mg Oral Daily   Chlorhexidine Gluconate Cloth  6 each Topical Daily   folic acid  1 mg Oral Daily   heparin  5,000 Units Subcutaneous Q8H   insulin aspart  0-9 Units Subcutaneous Q8H   lipase/protease/amylase  36,000 Units Oral TID AC   multivitamin with minerals  1 tablet Oral Daily   pantoprazole  40 mg Oral Daily   sodium chloride flush  3 mL  Intravenous Q12H   sodium chloride flush  3 mL Intravenous Q12H   thiamine  100 mg Oral Daily   traZODone  150 mg Oral QHS   Continuous Infusions:  sodium chloride     sodium chloride     cefTRIAXone (ROCEPHIN)  IV 200 mL/hr at 08/18/22 1716   metronidazole 500 mg (08/19/22 1208)   TPN ADULT (ION) 80 mL/hr at 08/18/22 1716   TPN ADULT (ION)      LOS: 4 days   Roxan Hockey M.D on 08/19/2022 at 12:41 PM  Go to [email or web address] - for contact info  [facility] - Office  [phone]  If 7PM-7AM, please contact night-coverage [email or web address] 08/19/2022, 12:41 PM

## 2022-08-19 NOTE — Progress Notes (Signed)
PHARMACY - TOTAL PARENTERAL NUTRITION CONSULT NOTE   Indication:  Intolerance to enteral feeding /recurrent pancreatitis with necrosis/pseudocyst.  Patient Measurements: Height: '5\' 3"'$  (160 cm) Weight: 71.4 kg (157 lb 6.5 oz) IBW/kg (Calculated) : 52.4 TPN AdjBW (KG): 56.7 Body mass index is 27.88 kg/m.  Assessment:  Patient on home TPN and MD wants to continue. Patient admitted with pancreatitis again and worsening abdominal pain.TPN was initiated via PICC line due to intolerance to adequate PO intake at last admission 07/25/22.    Glucose / Insulin: BS 109-157- 4 units given Electrolytes: WNL Renal: 0.39 Hepatic: Albumin 2.2   Central access: PICC placed 07/25/22 TPN start date: 07/25/22   Nutritional Goals: Goal TPN rate is 80 mL/hr (provides 111 g of protein and 1808 kcals per day)   RD Assessment: Estimated Needs Total Energy Estimated Needs: 1800-2000 Total Protein Estimated Needs: 100-115g Total Fluid Estimated Needs: >/=1.8L  Current Nutrition:  Clear liquids  Plan:   Continue TPN at 50m/hr at 1800 Electrolytes in TPN: Na 757m/L, K 5060mL, Ca 5mE41m, Mg 7mEq38m and Phos 8mmol46m Cl:Ac 1:1 Add standard MVI and trace elements to TPN Continue Sensitive q8h SSI and adjust as needed  Monitor TPN labs on Mon/Thurs, Labs in AM  StevenMargot AblesmD Clinical Pharmacist 08/19/2022 8:17 AM

## 2022-08-19 NOTE — Progress Notes (Signed)
Sent online message to attending so that attending would be aware of pt red med refusal, heparin.

## 2022-08-19 NOTE — Progress Notes (Signed)
Pt rating ABD pain 10/10 this evening.  States that her pain is not managed, not able to eat.  Reports she is passing gas and had a BM today.  Pt very tearful during conversation.  PRN given per orders, messages sent x4 over slightly less than 2 hrs, able to get additional order.  Meds given to pt, reassurance provided.  Dimmed lights in room and closed door to assist with patient comfort.

## 2022-08-19 NOTE — Progress Notes (Signed)
Subjective: Patient notes feeling slightly better today.  Continues to tolerate TPN.  Does states she did 2 laps around the hallway walking today.  Tolerating clear liquids.  Does note back burning sensation.  Objective: Vital signs in last 24 hours: Temp:  [97.1 F (36.2 C)-98.9 F (37.2 C)] 98.9 F (37.2 C) (01/14 0422) Pulse Rate:  [72-79] 74 (01/14 0422) Resp:  [16-18] 18 (01/14 0422) BP: (106-114)/(65-73) 108/65 (01/14 0422) SpO2:  [93 %-100 %] 100 % (01/14 0422) Last BM Date : 08/18/22 General:   Alert and oriented, pleasant Head:  Normocephalic and atraumatic. Eyes:  No icterus, sclera clear. Conjuctiva pink.  Abdomen:  Bowel sounds present, soft, tender to palpation epigastric region, non-distended. No HSM or hernias noted. No rebound or guarding. No masses appreciated  Msk:  Symmetrical without gross deformities. Normal posture. Extremities:  Without clubbing or edema. Neurologic:  Alert and  oriented x4;  grossly normal neurologically. Skin:  Warm and dry, intact without significant lesions.  Cervical Nodes:  No significant cervical adenopathy. Psych:  Alert and cooperative. Normal mood and affect.  Intake/Output from previous day: 01/13 0701 - 01/14 0700 In: 4903.3 [P.O.:1680; I.V.:2810.9; IV Piggyback:412.4] Out: -  Intake/Output this shift: No intake/output data recorded.  Lab Results: Recent Labs    08/17/22 0430  WBC 6.1  HGB 9.9*  HCT 31.9*  PLT 475*   BMET Recent Labs    08/17/22 0430 08/18/22 0351  NA 136 136  K 3.7 4.2  CL 104 103  CO2 27 27  GLUCOSE 137* 109*  BUN 8 11  CREATININE 0.35* 0.35*  CALCIUM 8.0* 8.3*   LFT Recent Labs    08/17/22 0430 08/18/22 0351  PROT 6.2* 6.6  ALBUMIN 2.2* 2.3*  AST 17 15  ALT 9 8  ALKPHOS 67 70  BILITOT <0.1* 0.3   PT/INR No results for input(s): "LABPROT", "INR" in the last 72 hours. Hepatitis Panel No results for input(s): "HEPBSAG", "HCVAB", "HEPAIGM", "HEPBIGM" in the last 72  hours.   Studies/Results: No results found.  Assessment: *Complicated pancreatitis with necrosis and pseudocyst formation *Nonocclusive portal vein/splenic vein thrombus *Splenic hematoma *Abdominal pain *Nausea and vomiting  Plan: Case was discussed earlier this week with Dr. Rush Landmark with [facility] who reviewed her case and does not feel that pseudocyst or necrosis requires immediate intervention at this time.  There was some initial concern for cholangitis on CT however her LFTs remain within normal limits.  ERCP likely would not be very beneficial at this time as potential pseudocyst/fluid collection is causing some narrowing of the bile duct.  If LFTs start to increase and this is thought to be secondary to pseudocyst then EUS for drainage may be warranted.    Continue on TPN.  Antiemetics and analgesia per hospitalist.  Will advance to soft low-fat diet today and see how she does.  Counseled to take a very slow.  If she has evidence of nausea or worsening pain then to back off.  Continue monitor LFTs.  Outpatient follow-up with advanced endoscopy.  Repeat CT pancreas in 2 months.  Jillian Shepherd. Abbey Chatters, D.O. Gastroenterology and Hepatology [facility]   LOS: 4 days    08/19/2022, 12:36 PM

## 2022-08-20 DIAGNOSIS — K859 Acute pancreatitis without necrosis or infection, unspecified: Secondary | ICD-10-CM | POA: Diagnosis not present

## 2022-08-20 LAB — COMPREHENSIVE METABOLIC PANEL
ALT: 7 U/L (ref 0–44)
AST: 17 U/L (ref 15–41)
Albumin: 2.2 g/dL — ABNORMAL LOW (ref 3.5–5.0)
Alkaline Phosphatase: 62 U/L (ref 38–126)
Anion gap: 8 (ref 5–15)
BUN: 13 mg/dL (ref 6–20)
CO2: 27 mmol/L (ref 22–32)
Calcium: 8.4 mg/dL — ABNORMAL LOW (ref 8.9–10.3)
Chloride: 102 mmol/L (ref 98–111)
Creatinine, Ser: 0.4 mg/dL — ABNORMAL LOW (ref 0.44–1.00)
GFR, Estimated: >60 mL/min (ref >60)
Glucose, Bld: 127 mg/dL — ABNORMAL HIGH (ref 70–99)
Potassium: 3.9 mmol/L (ref 3.5–5.1)
Sodium: 137 mmol/L (ref 135–145)
Total Bilirubin: 0.2 mg/dL — ABNORMAL LOW (ref 0.3–1.2)
Total Protein: 6.4 g/dL — ABNORMAL LOW (ref 6.5–8.1)

## 2022-08-20 LAB — GLUCOSE, CAPILLARY
Glucose-Capillary: 149 mg/dL — ABNORMAL HIGH (ref 70–99)
Glucose-Capillary: 152 mg/dL — ABNORMAL HIGH (ref 70–99)
Glucose-Capillary: 167 mg/dL — ABNORMAL HIGH (ref 70–99)
Glucose-Capillary: 172 mg/dL — ABNORMAL HIGH (ref 70–99)
Glucose-Capillary: 193 mg/dL — ABNORMAL HIGH (ref 70–99)

## 2022-08-20 LAB — CBC
HCT: 33.7 % — ABNORMAL LOW (ref 36.0–46.0)
Hemoglobin: 10.5 g/dL — ABNORMAL LOW (ref 12.0–15.0)
MCH: 27.2 pg (ref 26.0–34.0)
MCHC: 31.2 g/dL (ref 30.0–36.0)
MCV: 87.3 fL (ref 80.0–100.0)
Platelets: 446 10*3/uL — ABNORMAL HIGH (ref 150–400)
RBC: 3.86 MIL/uL — ABNORMAL LOW (ref 3.87–5.11)
RDW: 17.2 % — ABNORMAL HIGH (ref 11.5–15.5)
WBC: 7.1 10*3/uL (ref 4.0–10.5)
nRBC: 0 % (ref 0.0–0.2)

## 2022-08-20 LAB — PHOSPHORUS: Phosphorus: 3.4 mg/dL (ref 2.5–4.6)

## 2022-08-20 LAB — TRIGLYCERIDES: Triglycerides: 85 mg/dL (ref <150)

## 2022-08-20 LAB — MAGNESIUM: Magnesium: 1.7 mg/dL (ref 1.7–2.4)

## 2022-08-20 MED ORDER — MAGNESIUM SULFATE IN D5W 1-5 GM/100ML-% IV SOLN
1.0000 g | Freq: Once | INTRAVENOUS | Status: AC
  Administered 2022-08-20: 1 g via INTRAVENOUS
  Filled 2022-08-20: qty 100

## 2022-08-20 MED ORDER — TRAVASOL 10 % IV SOLN
INTRAVENOUS | Status: AC
  Filled 2022-08-20: qty 1113.6

## 2022-08-20 MED ORDER — OXYCODONE HCL 5 MG PO TABS
10.0000 mg | ORAL_TABLET | ORAL | Status: DC | PRN
  Administered 2022-08-20 – 2022-08-23 (×12): 10 mg via ORAL
  Filled 2022-08-20 (×13): qty 2

## 2022-08-20 MED ORDER — BOOST / RESOURCE BREEZE PO LIQD CUSTOM
1.0000 | Freq: Three times a day (TID) | ORAL | Status: DC
  Administered 2022-08-20 – 2022-08-23 (×9): 1 via ORAL

## 2022-08-20 NOTE — Progress Notes (Signed)
PROGRESS NOTE     Jillian Shepherd, is a 37 y.o. female, DOB - 11/04/1985, SWH:[id number]  Admit date - 08/14/2022   Admitting Physician Yaman Grauberger Denton Brick, MD  Outpatient Primary MD for the patient is Pickard, Cammie Mcgee, MD  LOS - 5  Chief Complaint  Patient presents with   Abdominal Pain        Brief summary -37 y.o. female  with PMH Liddle syndrome, obesity, alcoholic pancreatitis/chronic pancreatitis with pseudocyst formation with recurrent admissions for same  -Patient readmitted on 08/15/2022 with abdominal pain consistent with recurrent/acute on chronic pancreatitis --On 08/15/2022 had Conference with Dr. Gala Romney Linna Hoff) as well as Dr. Rush Landmark and  Dr. Lyndel Safe from [facility]--recommendations as outlined in the assessment and plan section of the admission H&P  A/p 1)Acute on chronic Pancreatitis Pseudocyst formation -Repeat CT abdomen and pelvis from 08/23/2022 shows  Unresolved pancreatic inflammation, with small but enlarging pseudocysts at both the porta hepatis and pancreatic tail with Superimposed necrosis of the distal tail. Mildly increased from last month dependent fluid and inflammation in the abdominal gutters and pelvis. --Also has mild increase of 3.5 cm pancreatic pseudocyst adjacent to pancreatic tail -GI following, appreciate assistance.  Patient has been unable to tolerate adequate oral nutrition and was recommended for initiation of TPN - PICC placed on 07/25/2022 and TPN has been started with plans for continuing TPN at home and slow diet advancement -Discussed with Dr. Rush Landmark from GI service who states pseudocyst in the pancreas tail/necrosis or possible necrosis in the tail is not something that would require absolute intervention at this point - will need to follow up with her outpatient GI, Dr. Marius Ditch as well; patient aware -She is also recommended to have 105-monthfollow-up pancreatic protocol CT per GI as well 08/20/22- ---experienced increased abdominal  pain over the last 24 hours with attempts at advancing diet -Patient would like to go back to clear liquid diet -Patient was on iv dilaudid for about 1 week Now (was on 1 mg iV every 2 hours for 6 days or so) ----she usually got it every 2 hrs  -on 08/19/22 we attempted to de-escalate had Dilaudid to q 3 hrs prn ---she is Not liking it---- -we have titrated oral oxycodone from 5 to 10 mg as needed ------- -Palliative care consult to help with pain management requested -when she does go home she will Need opiates....Marland KitchenMarland KitchenMarland Kitchene need to try work towards achieving pain control with oral and maybe transdermal opiates and try to move away from iv slowly  2)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 And diminutive but patent main portal vein, splenic vein, SMV. -As per Dr. MRush Landmarklarge splenic hematoma. In most cases these will decrease in size over time, but when they do not sometimes evacuation versus drainage may need to be considered (if there is any concern of infection, may need to think about aspiration of that). --Hematoma evacuation is a high risk procedure and I am not sure if the surgeons would do that unless it was increasing in size or other issues.  - no anticoagulation as per GI due to hematoma    3)Biliary Concerns--- CT abdomen and pelvis on 08/15/2021 shows Progressed, Moderate to Severe Hepatic Biliary Ductal Dilatation since last month. And superimposed ductal wall enhancement raises the possibility of Cholangitis. -LFTs are normal show  cholangitis less likely monitor LFTs for now - Thus I do think that for now TPN remains the main steadfast for her and if she could tolerate trickle feeds in the distal duodenum via [place] that may make sense for her in  an effort of trying to help things heal. But for now not sure there is much more that we would be able to do an endoscopically there is not something we would necessarily pursue right now. If the LFTs started to increase significantly and we believe that cyst was causing that, then an aspiration (not a cystgastrostomy or cystenterostomy) would be the first step where I do think EUS could be helpful. But again LFTs are normal. Not sure if that is help things or clouded the picture even more.  --Continue IV Rocephin and Flagyl for now for possible cholangitis  -stopped Rocephin and Flagyl after dose on 08/19/2022 as clinically and from a lab standpoint cholangitis seems less likely---- -continue to monitor LFTs   4)Hypokalemia -Replaced potassium, magnesium 1.9   5)Essential hypertension -Continue amlodipine   6)Liddle's syndrome -Monitore BP and electrolytes  7)FEN---continue TPN......Marland Kitchenper protocol  Status is: Inpatient   Disposition: The patient is from: Home              Anticipated d/c is to: Home              Anticipated d/c date is: 2 days              Patient currently is not medically stable to d/c. Barriers: Not Clinically Stable-   Code Status :  -  Code Status: Full Code   Family Communication:    NA (patient is alert, awake and coherent)   DVT Prophylaxis  :   - SCDs Place and maintain sequential compression device Start: 08/17/22 1431 Place TED hose Start: 08/17/22 1431 SCDs Start: 08/15/22 1605 Place TED hose Start: 08/15/22 1605 heparin injection 5,000 Units Start: 08/15/22 1400   Lab Results  Component Value Date   PLT 446 (H) 08/20/2022    Inpatient Medications  Scheduled Meds:  amLODipine  10 mg Oral Daily   Chlorhexidine Gluconate Cloth  6 each Topical Daily   feeding supplement  1 Container Oral TID BM   folic acid  1 mg Oral Daily   heparin  5,000 Units Subcutaneous Q8H   insulin aspart  0-9 Units Subcutaneous Q8H   lipase/protease/amylase  36,000  Units Oral TID AC   multivitamin with minerals  1 tablet Oral Daily   pantoprazole  40 mg Oral Daily   sodium chloride flush  3 mL Intravenous Q12H   sodium chloride flush  3 mL Intravenous Q12H   thiamine  100 mg Oral Daily   traZODone  150 mg Oral QHS   Continuous Infusions:  sodium chloride     sodium chloride     TPN ADULT (ION) 80 mL/hr at 08/20/22 1839   PRN Meds:.sodium chloride, sodium chloride, acetaminophen **OR** acetaminophen, bisacodyl, HYDROmorphone (DILAUDID) injection, meclizine, ondansetron **OR** ondansetron (ZOFRAN) IV, mouth rinse, oxyCODONE, sodium chloride flush, sodium chloride flush   Anti-infectives (From admission, onward)    Start     Dose/Rate Route Frequency Ordered Stop   08/15/22 1300  cefTRIAXone (ROCEPHIN) 2 g in sodium chloride 0.9 % 100 mL IVPB  Status:  Discontinued        2 g 200 mL/hr over 30 Minutes Intravenous Every 24 hours 08/15/22 0811 08/20/22 1104   08/15/22 1200  metroNIDAZOLE (FLAGYL) IVPB 500 mg  Status:  Discontinued        500 mg 100 mL/hr over 60 Minutes Intravenous Every 12 hours 08/15/22 0811 08/20/22 1104   08/15/22 1000  metroNIDAZOLE (FLAGYL) IVPB 500 mg  Status:  Discontinued        500 mg 100 mL/hr over 60 Minutes Intravenous Every 12 hours 08/15/22 0751 08/15/22 0811   08/15/22 0900  cefTRIAXone (ROCEPHIN) 2 g in sodium chloride 0.9 % 100 mL IVPB  Status:  Discontinued        2 g 200 mL/hr over 30 Minutes Intravenous Every 24 hours 08/15/22 0751 08/15/22 0811   08/15/22 0630  piperacillin-tazobactam (ZOSYN) IVPB 3.375 g  Status:  Discontinued        3.375 g 12.5 mL/hr over 240 Minutes Intravenous  Once 08/15/22 4098 08/15/22 0800       Subjective: [name] today has no fevers,  ,  No chest pain,   - 08/20/22- -experienced increased abdominal pain with attempts at advancing diet- -she wants to go back to clear liquids -LPN [place] at bedside - Objective: Vitals:   08/19/22 2009 08/20/22 0413 08/20/22 0843  08/20/22 1245  BP: 116/71 98/64 109/70 117/69  Pulse: 81 63 70 79  Resp: '17 18  18  '$ Temp: 98.7 F (37.1 C) 98.1 F (36.7 C) 97.8 F (36.6 C) 97.7 F (36.5 C)  TempSrc: Oral Oral Oral Oral  SpO2: 100% 100% 94% 100%  Weight:  68.9 kg    Height:        Intake/Output Summary (Last 24 hours) at 08/20/2022 1933 Last data filed at 08/20/2022 1839 Gross per 24 hour  Intake 3168.72 ml  Output 300 ml  Net 2868.72 ml   Filed Weights   08/15/22 1659 08/16/22 1126 08/20/22 0413  Weight: 69.6 kg 71.4 kg 68.9 kg    Physical Exam Gen:- Awake Alert,  in no apparent distress  HEENT:- [place].AT, No sclera icterus Neck-Supple Neck,No JVD,.  Lungs-  CTAB , fair symmetrical air movement CV- S1, S2 normal, regular  Abd-  +ve B.Sounds, Abd Soft, right upper quadrant periumbilical and left upper quadrant tenderness without rebound or guarding   Extremity/Skin:- No  edema, pedal pulses present  Psych-affect is appropriate, oriented x3 Neuro-no new focal deficits, no tremors  Data Reviewed: I have personally reviewed following labs and imaging studies  CBC: Recent Labs  Lab 08/15/22 0040 08/16/22 0432 08/17/22 0430 08/20/22 0500  WBC 10.1 6.1 6.1 7.1  NEUTROABS  --   --  3.2  --   HGB 12.0 10.0* 9.9* 10.5*  HCT 36.7 32.3* 31.9* 33.7*  MCV 85.2 89.2 88.6 87.3  PLT 567* 494* 475* 119*   Basic Metabolic Panel: Recent Labs  Lab 08/15/22 0040 08/15/22 0045 08/16/22 0432 08/17/22 0430 08/18/22 0351 08/20/22 0500  NA 133*  --  138 136 136 137  K 3.2*  --  3.7 3.7 4.2 3.9  CL 98  --  107 104 103 102  CO2 24  --  '24 27 27 27  '$ GLUCOSE 99  --  60* 137* 109* 127*  BUN 10  --  '7 8 11 13  '$ CREATININE 0.45  --  0.39* 0.35* 0.35* 0.40*  CALCIUM 8.8*  --  8.0* 8.0* 8.3* 8.4*  MG  --  1.7  --  1.9  --  1.7  PHOS  --   --   --  3.1  --  3.4   GFR: Estimated Creatinine Clearance: 89.7 mL/min (A) (by C-G formula based  on SCr of 0.4 mg/dL (L)). Liver Function Tests: Recent Labs  Lab  08/15/22 0040 08/16/22 0432 08/17/22 0430 08/18/22 0351 08/20/22 0500  AST '18 20 17 15 17  '$ ALT '12 11 9 8 7  '$ ALKPHOS 92 66 67 70 62  BILITOT 0.9 0.3 <0.1* 0.3 0.2*  PROT 7.9 6.2* 6.2* 6.6 6.4*  ALBUMIN 2.8* 2.2* 2.2* 2.3* 2.2*   Radiology Studies: No results found.  Scheduled Meds:  amLODipine  10 mg Oral Daily   Chlorhexidine Gluconate Cloth  6 each Topical Daily   feeding supplement  1 Container Oral TID BM   folic acid  1 mg Oral Daily   heparin  5,000 Units Subcutaneous Q8H   insulin aspart  0-9 Units Subcutaneous Q8H   lipase/protease/amylase  36,000 Units Oral TID AC   multivitamin with minerals  1 tablet Oral Daily   pantoprazole  40 mg Oral Daily   sodium chloride flush  3 mL Intravenous Q12H   sodium chloride flush  3 mL Intravenous Q12H   thiamine  100 mg Oral Daily   traZODone  150 mg Oral QHS   Continuous Infusions:  sodium chloride     sodium chloride     TPN ADULT (ION) 80 mL/hr at 08/20/22 1839    LOS: 5 days   Roxan Hockey M.D on 08/20/2022 at 7:33 PM  Go to [email or web address] - for contact info  [facility] - Office  [phone]  If 7PM-7AM, please contact night-coverage [email or web address] 08/20/2022, 7:33 PM

## 2022-08-20 NOTE — Progress Notes (Signed)
Gastroenterology Progress Note   Referring Provider: No ref. provider found Primary Care Physician:  [name], MD Primary Gastroenterologist:  Dr. Marius Ditch  Patient ID: Jillian Shepherd; [id number]; [birth year]   Subjective:    Patient tearful. Has increased pain with bites of soft diet. Her pain has not been managed over the past 24 hours. She is finding it hard to tolerate. Reported pain 10/10 last night with no additional change in pain management. She is feeling nauseated after breakfast. She has gas and is still passing stool.   Objective:   Vital signs in last 24 hours: Temp:  [97.8 F (36.6 C)-98.7 F (37.1 C)] 97.8 F (36.6 C) (01/15 0843) Pulse Rate:  [63-81] 70 (01/15 0843) Resp:  [17-20] 18 (01/15 0413) BP: (98-116)/(64-75) 109/70 (01/15 0843) SpO2:  [94 %-100 %] 94 % (01/15 0843) Weight:  [68.9 kg] 68.9 kg (01/15 0413) Last BM Date : 08/19/22 General:   Alert,  Well-developed, well-nourished, pleasant and cooperative in NAD. Very tearful today. Head:  Normocephalic and atraumatic. Eyes:  Sclera clear, no icterus.  Abdomen:  Soft,  nondistended. Moderate epigastric tenderness. Normal bowel sounds, without guarding, and without rebound.   Extremities:  Without clubbing, deformity or edema. Neurologic:  Alert and  oriented x4;  grossly normal neurologically. Skin:  Intact without significant lesions or rashes. Psych:  Alert and cooperative. Normal mood and affect.  Intake/Output from previous day: 01/14 0701 - 01/15 0700 In: 2966.1 [P.O.:720; I.V.:1802.8; IV Piggyback:443.3] Out: 300 [Urine:300] Intake/Output this shift: No intake/output data recorded.  Lab Results: CBC Recent Labs    08/20/22 0500  WBC 7.1  HGB 10.5*  HCT 33.7*  MCV 87.3  PLT 446*   BMET Recent Labs    08/18/22 0351 08/20/22 0500  NA 136 137  K 4.2 3.9  CL 103 102  CO2 27 27  GLUCOSE 109* 127*  BUN 11 13  CREATININE 0.35* 0.40*  CALCIUM 8.3* 8.4*   LFTs Recent Labs     08/18/22 0351 08/20/22 0500  BILITOT 0.3 0.2*  ALKPHOS 70 62  AST 15 17  ALT 8 7  PROT 6.6 6.4*  ALBUMIN 2.3* 2.2*   No results for input(s): "LIPASE" in the last 72 hours. PT/INR No results for input(s): "LABPROT", "INR" in the last 72 hours.       Imaging Studies: CT ABDOMEN PELVIS W CONTRAST  Result Date: 08/15/2022 CLINICAL DATA:  37 year old female with abdominal pain. History of pancreatitis, subcapsular splenic hematoma. EXAM: CT ABDOMEN AND PELVIS WITH CONTRAST TECHNIQUE: Multidetector CT imaging of the abdomen and pelvis was performed using the standard protocol following bolus administration of intravenous contrast. RADIATION DOSE REDUCTION: This exam was performed according to the departmental dose-optimization program which includes automated exposure control, adjustment of the mA and/or kV according to patient size and/or use of iterative reconstruction technique. CONTRAST:  156m OMNIPAQUE IOHEXOL 300 MG/ML  SOLN COMPARISON:  CT Abdomen and Pelvis 07/21/2022 and earlier. FINDINGS: Lower chest: Decreased but not resolved left pleural effusion with associated pleural thickening and enhancement compatible with organized pleural collection (less likely empyema given interval decrease). Associated round atelectasis in the left lower lobe. No pericardial effusion. Partially visible SVC vascular catheter. Mild right lung base atelectasis. No right pleural effusion. Hepatobiliary: Heterogeneous liver perfusion and worsening intrahepatic biliary ductal dilatation with increased bile duct wall enhancement and central intrahepatic bile ducts are up to 10 mm diameter now (series 2, image 25). Gallbladder also slightly more distended. And there is a small  but increasing porta hepatis fluid collection which is probably a pseudo cyst measuring 19 x 27 x 26 mm now, 2 cm or smaller last month. CBD appears to taper distally. Pancreas: Unresolved peripancreatic inflammation with partial pancreatic  atrophy. Organizing but irregular 2.5 to 2.8 cm pseudocyst of the pancreatic tail on series 2, image 34 and coronal image 43. Superimposed pancreatic necrosis of the distal tail (series 2, image 34). Peripancreatic inflammation. Spleen: Large subcapsular splenic fluid collection with mixed density previously thought to be subcapsular hematoma. This is 87 x 125 by 99 mm (AP by transverse by CC) volume estimated at 500 38 mL. Size not significantly changed from last month. Mass effect on the more homogeneously enhancing splenic parenchyma as before. Adrenals/Urinary Tract: Despite regional inflammation, adrenal glands and kidneys remain within normal limits. No delayed renal images. Decompressed urinary bladder. Stomach/Bowel: No dilated large or small bowel loops. Increased free fluid and/or inflammatory stranding in both pericolic gutters since last month. No free air. Mass effect on the stomach secondary to abnormal spleen and other upper abdominal viscera. And small volume of fluid within the stomach. No discrete gastric fluid collection. Duodenum may be mildly inflamed but otherwise normal. No pneumoperitoneum identified. Vascular/Lymphatic: Some cavernous transformation of the main portal vein is redemonstrated. Diminutive main portal vein caliber, and diminutive junction with the SMV, but the mesenteric portal veins appear to remain patent. Splenic vein also remains patent on series 2, image 33. Superimposed major arterial structures in the abdomen and pelvis remain patent. Subtle aortic atherosclerosis. No lymphadenopathy identified. Reproductive: Within normal limits. Other: Small volume free fluid in the pelvis. Musculoskeletal: No acute osseous abnormality identified. IMPRESSION: 1. Unresolved pancreatic inflammation, with small but enlarging pseudocysts at both the porta hepatis (see also #2 - series 2, image 28) and pancreatic tail (image 34). Superimposed necrosis of the distal tail. Mildly increased  from last month dependent fluid and inflammation in the abdominal gutters and pelvis. 2. Progressed, Moderate to Severe Hepatic Biliary Ductal Dilatation since last month. And superimposed ductal wall enhancement raises the possibility of Cholangitis. 3. Large Splenic Subcapsular Hematoma and/or complex collection (538 mL) not significantly changed. Mass effect on the spleen. 4. Organized left pleural collection with pleural thickening at the lung base has regressed but not resolved. Associated left lower lobe atelectasis. 5. 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 And diminutive but patent main portal vein, splenic vein, SMV. Electronically Signed   By: Genevie Ann M.D.   On: 08/15/2022 05:53   [place] EKG SITE RITE  Result Date: 07/24/2022 If Site Rite image not attached, placement could not be confirmed due to current cardiac rhythm.  CT ABDOMEN PELVIS W CONTRAST  Result Date: 07/21/2022 CLINICAL DATA:  Follow-up acute pancreatitis and splenic hematoma. Worsening abdominal pain for several days. Nausea and vomiting. EXAM: CT ABDOMEN AND PELVIS WITH CONTRAST TECHNIQUE: Multidetector CT imaging of the abdomen and pelvis was performed using the standard protocol following bolus administration of intravenous contrast. RADIATION DOSE REDUCTION: This exam was performed according to the departmental dose-optimization program which includes automated exposure control, adjustment of the mA and/or kV according to patient size and/or use of iterative reconstruction technique. CONTRAST:  159m OMNIPAQUE IOHEXOL 300 MG/ML  SOLN COMPARISON:  07/08/2022 FINDINGS: Lower Chest: Small left pleural effusion and left lower lobe atelectasis have decreased since prior study. Hepatobiliary: A 7 mm hypervascular lesion is seen in the posterior liver dome. This remains stable in retrospect compared to previous study, likely representing a flash-filling hemangioma.  Gallbladder is unremarkable. No evidence of biliary ductal  dilatation. Pancreas: Peripancreatic edema is again seen surrounding the pancreatic body and tail, consistent with acute pancreatitis. A 3.5 x 1.9 cm fluid collection is seen adjacent to the pancreatic tail, mildly increased from 2.2 x 1.9 cm previously. This is consistent with a pancreatic pseudocyst. No evidence of pancreatic ductal dilatation. Spleen: A large subcapsular hematoma is again seen along the lateral aspect of the spleen, which is slightly decreased in size. This currently measures 13.7 x 9.7 cm, compared to 14.3 x 10.5 cm previously. Adrenals/Urinary Tract: No suspicious masses identified. No evidence of ureteral calculi or hydronephrosis. Stomach/Bowel: No evidence of obstruction, inflammatory process or abnormal fluid collections. Normal appendix visualized. Vascular/Lymphatic: 12 mm lymph node in the porta hepatis remains stable, likely reactive in etiology. Thrombosis of the proximal splenic vein is again seen with venous collaterals in the left upper quadrant. Nonocclusive thrombus again seen throughout the portal veins, which is increased in the left portal vein since previous study. Reproductive:  No mass or other significant abnormality. Other:  None. Musculoskeletal:  No suspicious bone lesions identified. IMPRESSION: Moderate acute pancreatitis, without significant change. Mild increase in size of 3.5 cm pancreatic pseudocyst adjacent to the pancreatic tail. Mild decrease in size of large subcapsular splenic hematoma. 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 Nonocclusive thrombus throughout the portal veins, which is increased in the left portal vein since previous study. Decreased small left pleural effusion and left lower lobe atelectasis. Stable 7 mm hypervascular lesion in posterior liver dome, likely representing a flash-filling hemangioma. Recommend continued attention on follow-up imaging. Electronically Signed   By: Marlaine Hind  M.D.   On: 07/21/2022 14:28   US Abdomen Complete  Result Date: 07/21/2022 CLINICAL DATA:  Abdominal pain. Known acute pancreatitis, splenic hematoma, and pancreatic pseudocyst. Technologist indicates limited study due to patient pain tolerance, bowel gas, and limited mobility. EXAM: ABDOMEN ULTRASOUND COMPLETE COMPARISON:  CT abdomen and pelvis July 08, 2022 FINDINGS: Gallbladder: Layering echogenic material within the gallbladder, likely sludge. No gallstones or wall thickening visualized. Positive sonographic Murphy sign noted by the sonographer, however limited assessment in the setting acute pancreatitis. Common bile duct: Diameter: 7 mm Liver: Increased and heterogeneous hepatic parenchymal echogenicity. There is also a more focal masslike echogenic area in the left hepatic lobe that measures 5.4 x 5.3 x 5.1 cm. Near-occlusive thrombus in the main portal vein. IVC: No abnormality visualized. Pancreas: Limited visualization secondary to overlying bowel gas. The known pancreatic pseudocyst is not well seen. Spleen: Heterogeneous perisplenic fluid collection that measures approximately 11.3 x 6.1 x 7.0 cm. Right Kidney: Length: 12.2 cm. Echogenicity within normal limits. No mass or hydronephrosis visualized. Left Kidney: Length: 11.9 cm. Echogenicity within normal limits. No mass or hydronephrosis visualized. Abdominal aorta: No aneurysm visualized. Other findings: None. IMPRESSION: 1. Large perisplenic hematoma, better assessed on recent CT. 2. Near-occlusive thrombus in the main portal vein. 3. There is a 5.4 cm focal masslike echogenic area in the left hepatic lobe, incompletely assessed on ultrasound. Differential considerations include differential hepatic perfusion in the setting of portal vein thrombus, hematoma, or focal mass. Recommend further assessment with liver MRI with and without contrast. 4. Gallbladder sludge without sonographic evidence of acute cholecystitis. The mildly dilated common  bile duct and positive sonographic Murphy sign noted by the sonographer is favored to be related to known acute pancreatitis. Electronically Signed   By: Monica Becton.D.  On: 07/21/2022 12:32  [2 weeks]  Assessment:   Jillian Shepherd is a 37 y.o. 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 Well-known to GI with recent hospitalization for recurrent pancreatitis. This is her fourth admission since 05/2022.     Recurrent pancreatitis with necrosis/pseudocyst: last admission in December 2023 when TPN was initiated via PICC line due to intolerance to adequate PO intake. She was D/C'd on 12/22. New RUQ/Right shoulder pain prior to this admission.     Case discussed with advanced endoscopist Dr. Early Osmond with [facility] who reviewed the case and does not feel that pseudocyst or necrosis requires immediate intervention at this time. There was concern for cholangitis on the CT, though LFTs remain WNL, low likelihood of cholangitis given this. Patient has been covered with antibiotics with plans to discontinue after today's dose. ERCP likely would not be very beneficial at this time, as potential that pseudocyst/fluid collection is causing some narrowing of bile duct, if LFTs start to increase and this is thought secondary to pseudocyst EUS for drainage may then be warranted.    Today: Her LFTs remain normal. She reports pain has been worse the last 24 hours as her pain medication has been decreased. She has noted worsening pain after soft diet. States her pain is not being managed. Dilaudid 0.'5mg'$  lasting 45 minutes.    Non occlusive thrombus of portal vein/splenic vein thrombosis/Splenic hematoma: Large Splenic Subcapsular Hematoma and/or complex  collection, not significantly changed from previous imaging. Mass effect on the spleen and Partial cavernous transformation of the portal vein, diminutive but patent main portal vein, splenic vein, SMV. As above, case was reviewed by advanced endoscopist with [facility], Dr. Rush Landmark advised in most cases these will decrease in size over time, if this does not/becomes larger, may require evacuation vs drainage, if there is any concern for infection, may need to consider aspiration, however evacuation of hematoma Is high risk procedure and would not be indicated unless this is becoming more of an issue.    Following H/H, which has been stable.      Plan:   Continue TPN. Continue pain management per attending. Discussed with attending, would anticipate need for chronic management given extent of her illness and lack of significant improvement on scans in the last 3 months.   Patient did not tolerate soft diet, will downgrade to clears per her request.  Will require repeat CT pancreas in 2 months. If worsening pain, concern for intra-abdominal hemorrhage or infection, or rise in LFTs she would require repeat imaging sooner.    LOS: 5 days   Laureen Ochs. Bernarda Caffey [facility] [phone] 1/15/20249:53 AM

## 2022-08-20 NOTE — Progress Notes (Signed)
Pt continues to rate pain from 8-10 this shift, requesting interventions.  No nausea reported.  Pt states she is eating very little, oral intake increases pain.  Ambulatory in room.  Tolerating TPN

## 2022-08-20 NOTE — Progress Notes (Signed)
PHARMACY - TOTAL PARENTERAL NUTRITION CONSULT NOTE   Indication:  Intolerance to enteral feeding /recurrent pancreatitis with necrosis/pseudocyst.  Patient Measurements: Height: '5\' 3"'$  (160 cm) Weight: 68.9 kg (151 lb 14.4 oz) IBW/kg (Calculated) : 52.4 TPN AdjBW (KG): 56.7 Body mass index is 26.91 kg/m.  Assessment:  Patient on home TPN and MD wants to continue. Patient admitted with pancreatitis again and worsening abdominal pain.TPN was initiated via PICC line due to intolerance to adequate PO intake at last admission 07/25/22.    Glucose / Insulin: BS 149 / 2 Electrolytes: WNL Renal: 0.4 Hepatic: Albumin 2.2   Central access: PICC placed 07/25/22 TPN start date: 07/25/22   Nutritional Goals: Goal TPN rate is 80 mL/hr (provides 111 g of protein and 1808 kcals per day)   RD Assessment: Estimated Needs Total Energy Estimated Needs: 1800-2000 Total Protein Estimated Needs: 100-115g Total Fluid Estimated Needs: >/=1.8L  Current Nutrition:  Clear liquids  Plan:   Continue TPN at 73m/hr at 1800 Electrolytes in TPN: Na 776m/L, K 5086mL, Ca 5mE70m, Mg 7mEq41m and Phos 8mmol58m Cl:Ac 1:1 Add standard MVI and trace elements to TPN Continue Sensitive q8h SSI and adjust as needed  Monitor TPN labs on Mon/Thurs, Labs in AM  MarianAllayne StackmD Clinical Pharmacist 08/20/2022 10:49 AM

## 2022-08-20 NOTE — Progress Notes (Signed)
Initial Nutrition Assessment  DOCUMENTATION CODES:      INTERVENTION:  TPN per pharmacy  Clear liquids  Trial Boost Breeze TID   NUTRITION DIAGNOSIS:   Moderate Malnutrition related to catabolic illness, acute illness, chronic illness (acute on chronic pancreatitis) as evidenced by percent weight loss, mild fat depletion, mild muscle depletion. - addressed by TPN  GOAL:  Patient will meet greater than or equal to 90% of their needs -ongoing  MONITOR:  Labs, Weight trends (po intake clear liquids)  REASON FOR ASSESSMENT:  Consult (TPN/TNA)    ASSESSMENT: patient is a 38 yo female with hx of DM2, malnutrition, acute recurrent pancreatitis with necrosis/pseudocyst. Known to RD from previous admissions.   Clear liquids currently. TPN is being resumed.   TPN at home. She has been eating pudding, ice cream, popsicle, Mango Ice and broth.    Weight range 67-70 kg December. September patient weight 85-86 kg. A loss of 18% (15-16 kg) body weight x 4 months which is clinically significant.  1/15- patient continues to complain of pain. Diet upgraded to soft yesterday however unable to tolerate per GI note. Downgraded back to clears per patient request.  Meal intake ~ 50% each meal per chart (soft diet). Able to feed herself. Will add clear liquid supplement to support oral intake.  Medications: folic acid, MVI, Thiamine and IV-flagyl  IVF NS @ 150 ml/hr      Latest Ref Rng & Units 08/20/2022    5:00 AM 08/18/2022    3:51 AM 08/17/2022    4:30 AM  BMP  Glucose 70 - 99 mg/dL 127  109  137   BUN 6 - 20 mg/dL '13  11  8   '$ Creatinine 0.44 - 1.00 mg/dL 0.40  0.35  0.35   Sodium 135 - 145 mmol/L 137  136  136   Potassium 3.5 - 5.1 mmol/L 3.9  4.2  3.7   Chloride 98 - 111 mmol/L 102  103  104   CO2 22 - 32 mmol/L '27  27  27   '$ Calcium 8.9 - 10.3 mg/dL 8.4  8.3  8.0       NUTRITION - FOCUSED PHYSICAL EXAM:  Nutrition-Focused physical exam findings are mild fat depletion, mild and  moderate muscle depletion, and mild edema.    Diet Order:   Diet Order             Diet clear liquid Room service appropriate? Yes; Fluid consistency: Thin  Diet effective now                   EDUCATION NEEDS:  Education needs have been addressed  Skin:  Skin Assessment: Reviewed RN Assessment  Last BM:  1/14 large type 6  Height:   Ht Readings from Last 1 Encounters:  08/15/22 '5\' 3"'$  (1.6 m)    Weight:   Wt Readings from Last 1 Encounters:  08/20/22 68.9 kg    Ideal Body Weight:   52 kg  BMI:  Body mass index is 26.91 kg/m.  Estimated Nutritional Needs:   Kcal:  1800-2000  Protein:  100-115 gr  Fluid:  1.8-2 liters daily  Colman Cater MS,RD,CSG,LDN Contact: Shea Evans

## 2022-08-21 ENCOUNTER — Telehealth: Payer: Self-pay | Admitting: Gastroenterology

## 2022-08-21 DIAGNOSIS — K859 Acute pancreatitis without necrosis or infection, unspecified: Secondary | ICD-10-CM | POA: Diagnosis not present

## 2022-08-21 LAB — GLUCOSE, CAPILLARY
Glucose-Capillary: 153 mg/dL — ABNORMAL HIGH (ref 70–99)
Glucose-Capillary: 186 mg/dL — ABNORMAL HIGH (ref 70–99)
Glucose-Capillary: 135 mg/dL — ABNORMAL HIGH (ref 70–99)
Glucose-Capillary: 169 mg/dL — ABNORMAL HIGH (ref 70–99)
Glucose-Capillary: 166 mg/dL — ABNORMAL HIGH (ref 70–99)

## 2022-08-21 MED ORDER — HYDROMORPHONE HCL 1 MG/ML IJ SOLN
0.5000 mg | INTRAMUSCULAR | Status: DC | PRN
  Administered 2022-08-22 – 2022-08-23 (×4): 0.5 mg via INTRAVENOUS
  Filled 2022-08-21 (×4): qty 0.5

## 2022-08-21 MED ORDER — TRAVASOL 10 % IV SOLN
INTRAVENOUS | Status: AC
  Filled 2022-08-21: qty 1113.6

## 2022-08-21 MED ORDER — FENTANYL 25 MCG/HR TD PT72
1.0000 | MEDICATED_PATCH | TRANSDERMAL | Status: DC
  Administered 2022-08-21: 1 via TRANSDERMAL
  Filled 2022-08-21: qty 1

## 2022-08-21 NOTE — Progress Notes (Signed)
Ambulating in hallway without difficulty,patient states " the pain is more tolerable,".Plan of care on going.

## 2022-08-21 NOTE — Progress Notes (Signed)
Pt complained of 9/10 severe pain in abdomen when she got up to have a BM. PRN dilaudid given. Pt slept through the night with respirations even and unlabored. Vitals stable.

## 2022-08-21 NOTE — TOC Progression Note (Signed)
Transition of Care [facility]) - Progression Note    Patient Details  Name: Jillian Shepherd MRN: [id number] Date of Birth: 1986/07/30  [facility] CM/SW Contact  Boneta Lucks, RN Phone Number: 08/21/2022, 1:32 PM  Clinical Narrative:   Palliative Consulted. Patient needs pain control and management.    Barriers to Discharge: Continued Medical Work up  Expected Discharge Plan and Services    Social Determinants of Health (SDOH) Interventions SDOH Screenings   Food Insecurity: No Food Insecurity (08/15/2022)  Housing: Low Risk  (08/15/2022)  Transportation Needs: No Transportation Needs (08/15/2022)  Utilities: Not At Risk (08/15/2022)  Depression (PHQ2-9): Low Risk  (05/17/2022)  Tobacco Use: High Risk (08/16/2022)    Readmission Risk Interventions    08/21/2022    1:31 PM  Readmission Risk Prevention Plan  Transportation Screening Complete  Medication Review ([place]) Complete  PCP or Specialist appointment within 3-5 days of discharge Not Complete  HRI or [place] Complete  SW Recovery Care/Counseling Consult Complete  Palliative Care Screening Complete  [place] Not Applicable

## 2022-08-21 NOTE — Progress Notes (Signed)
Gastroenterology Progress Note   Referring Provider: No ref. provider found Primary Care Physician:  [name], MD Primary Gastroenterologist:  Dr. Marius Ditch, patient prefers staying locally with [facility]  Patient ID: Jillian Shepherd; [id number]; 1985/09/03   Subjective:    States she rested overnight. The increased oral pain medication seems to be helpful. Taking only sips of clears. Tearful today. She shared her story regarding her mom's illness and death and having to take care of younger minor sister after her death 2021-06-02. She feels she is a burden to her husband and children having lost her job and not being able to be home to take care of them. She is worried her husband may lose his job as he is pulled to take care of the children.   Objective:   Vital signs in last 24 hours: Temp:  [97.7 F (36.5 C)-98.2 F (36.8 C)] 98 F (36.7 C) (01/16 0453) Pulse Rate:  [70-82] 82 (01/16 0453) Resp:  [18-19] 19 (01/16 0453) BP: (99-117)/(64-73) 114/64 (01/16 0453) SpO2:  [94 %-100 %] 100 % (01/16 0453) Last BM Date : 08/19/22 General:   Alert, tearful, Well-developed, well-nourished, pleasant and cooperative in NAD Head:  Normocephalic and atraumatic. Eyes:  Sclera clear, no icterus.   Abdomen:  Soft,  nondistended.  Normal bowel sounds, without guarding, and without rebound.  Mild epigastric tenderness. Extremities:  Without clubbing, deformity or edema. Neurologic:  Alert and  oriented x4;  grossly normal neurologically. Skin:  Intact without significant lesions or rashes. Psych:  Alert and cooperative. Normal mood and affect.  Intake/Output from previous day: 01/15 0701 - 01/16 0700 In: 1162.6 [P.O.:240; I.V.:922.6] Out: 200 [Urine:200] Intake/Output this shift: No intake/output data recorded.  Lab Results: CBC Recent Labs    08/20/22 0500  WBC 7.1  HGB 10.5*  HCT 33.7*  MCV 87.3  PLT 446*   BMET Recent Labs    08/20/22 0500  NA 137  K 3.9  CL 102  CO2 27   GLUCOSE 127*  BUN 13  CREATININE 0.40*  CALCIUM 8.4*   LFTs Recent Labs    08/20/22 0500  BILITOT 0.2*  ALKPHOS 62  AST 17  ALT 7  PROT 6.4*  ALBUMIN 2.2*   No results for input(s): "LIPASE" in the last 72 hours. PT/INR No results for input(s): "LABPROT", "INR" in the last 72 hours.       Imaging Studies: CT ABDOMEN PELVIS W CONTRAST  Result Date: 08/15/2022 CLINICAL DATA:  37 year old female with abdominal pain. History of pancreatitis, subcapsular splenic hematoma. EXAM: CT ABDOMEN AND PELVIS WITH CONTRAST TECHNIQUE: Multidetector CT imaging of the abdomen and pelvis was performed using the standard protocol following bolus administration of intravenous contrast. RADIATION DOSE REDUCTION: This exam was performed according to the departmental dose-optimization program which includes automated exposure control, adjustment of the mA and/or kV according to patient size and/or use of iterative reconstruction technique. CONTRAST:  136m OMNIPAQUE IOHEXOL 300 MG/ML  SOLN COMPARISON:  CT Abdomen and Pelvis 07/21/2022 and earlier. FINDINGS: Lower chest: Decreased but not resolved left pleural effusion with associated pleural thickening and enhancement compatible with organized pleural collection (less likely empyema given interval decrease). Associated round atelectasis in the left lower lobe. No pericardial effusion. Partially visible SVC vascular catheter. Mild right lung base atelectasis. No right pleural effusion. Hepatobiliary: Heterogeneous liver perfusion and worsening intrahepatic biliary ductal dilatation with increased bile duct wall enhancement and central intrahepatic bile ducts are up to 10 mm diameter now (series 2, image  25). Gallbladder also slightly more distended. And there is a small but increasing porta hepatis fluid collection which is probably a pseudo cyst measuring 19 x 27 x 26 mm now, 2 cm or smaller last month. CBD appears to taper distally. Pancreas: Unresolved  peripancreatic inflammation with partial pancreatic atrophy. Organizing but irregular 2.5 to 2.8 cm pseudocyst of the pancreatic tail on series 2, image 34 and coronal image 43. Superimposed pancreatic necrosis of the distal tail (series 2, image 34). Peripancreatic inflammation. Spleen: Large subcapsular splenic fluid collection with mixed density previously thought to be subcapsular hematoma. This is 87 x 125 by 99 mm (AP by transverse by CC) volume estimated at 500 38 mL. Size not significantly changed from last month. Mass effect on the more homogeneously enhancing splenic parenchyma as before. Adrenals/Urinary Tract: Despite regional inflammation, adrenal glands and kidneys remain within normal limits. No delayed renal images. Decompressed urinary bladder. Stomach/Bowel: No dilated large or small bowel loops. Increased free fluid and/or inflammatory stranding in both pericolic gutters since last month. No free air. Mass effect on the stomach secondary to abnormal spleen and other upper abdominal viscera. And small volume of fluid within the stomach. No discrete gastric fluid collection. Duodenum may be mildly inflamed but otherwise normal. No pneumoperitoneum identified. Vascular/Lymphatic: Some cavernous transformation of the main portal vein is redemonstrated. Diminutive main portal vein caliber, and diminutive junction with the SMV, but the mesenteric portal veins appear to remain patent. Splenic vein also remains patent on series 2, image 33. Superimposed major arterial structures in the abdomen and pelvis remain patent. Subtle aortic atherosclerosis. No lymphadenopathy identified. Reproductive: Within normal limits. Other: Small volume free fluid in the pelvis. Musculoskeletal: No acute osseous abnormality identified. IMPRESSION: 1. Unresolved pancreatic inflammation, with small but enlarging pseudocysts at both the porta hepatis (see also #2 - series 2, image 28) and pancreatic tail (image 34).  Superimposed necrosis of the distal tail. Mildly increased from last month dependent fluid and inflammation in the abdominal gutters and pelvis. 2. Progressed, Moderate to Severe Hepatic Biliary Ductal Dilatation since last month. And superimposed ductal wall enhancement raises the possibility of Cholangitis. 3. Large Splenic Subcapsular Hematoma and/or complex collection (538 mL) not significantly changed. Mass effect on the spleen. 4. Organized left pleural collection with pleural thickening at the lung base has regressed but not resolved. Associated left lower lobe atelectasis. 5. 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 And diminutive but patent main portal vein, splenic vein, SMV. Electronically Signed   By: Genevie Ann M.D.   On: 08/15/2022 05:53   [place] EKG SITE RITE  Result Date: 07/24/2022 If Site Rite image not attached, placement could not be confirmed due to current cardiac rhythm. [2 weeks]  Assessment:   Jillian Shepherd is a 37 y.o. 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 Well-known to GI with recent hospitalization for recurrent pancreatitis. This is her fourth admission since 05/2022.     Recurrent pancreatitis with necrosis/pseudocyst: last admission in December 2023 when TPN was initiated via PICC line due to intolerance to adequate PO intake. She was D/C'd on 12/22. New RUQ/Right shoulder pain prior to this admission.     Case discussed with advanced endoscopist Dr. Early Osmond with [facility] who reviewed the case and does not feel that pseudocyst or necrosis requires  immediate intervention at this time. There was concern for cholangitis on the CT, though LFTs remain WNL, low likelihood of cholangitis given this.  Patient has been covered with antibiotics with plans to discontinue after today's dose. ERCP likely would not be very beneficial at this time, as potential that pseudocyst/fluid collection is causing some narrowing of bile duct, if LFTs start to increase and this is thought secondary to pseudocyst EUS for drainage may then be warranted.    Today: Her LFTs have remained normal this admission, no labs today. She reports pain has been better controlled the last 24 hours with increased oral pain medication. She understands the goal of getting her home with TPN and adequate pain management. She is not interested in going back to Dr. Marius Ditch. She is interested in seeing pancreatic specialist at [facility] and continuing her care locally.     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 Mass effect on the spleen and Partial cavernous transformation of the portal vein, diminutive but patent main portal vein, splenic vein, SMV. As above, case was reviewed by advanced endoscopist with [facility], Dr. Rush Landmark advised in most cases these will decrease in size over time, if this does not/becomes larger, may require evacuation vs drainage, if there is any concern for infection, may need to consider aspiration, however evacuation of hematoma Is high risk procedure and would not be indicated unless this is becoming more of an issue.    Following H/H, which has been stable.   Plan:   Discussed outpatient pain management at length with attending. Palliative consult pending to assist.  Referral to [facility] as outpatient. Patient does not want to follow with Dr. Marius Ditch and prefers continuing with [facility] but reasonable to set her up with GI specializing in pancreatic disease at [facility] in case she needs ERCP/EUS.  Repeat CT pancreas in 2 months. Sooner if concern for intra-abdominal hemorrhage or  infection, or rise in LFTs.    LOS: 6 days   Laureen Ochs. Bernarda Caffey [facility] [phone] 1/16/20248:02 AM

## 2022-08-21 NOTE — Telephone Encounter (Signed)
Please send urgent referral to Dr. Aviva Signs at [facility] for complicated pancreatitis with pseudocysts, pancreatic necrosis, new biliary ductal dilation, large stable subcapsular hematoma. Patient with multiple hospitalizations due to refractory symptoms. Poor nutrition, limited oral intake, on TPN.   Patient should be discharged in the next couple of days.

## 2022-08-21 NOTE — Progress Notes (Signed)
PROGRESS NOTE     Jillian Shepherd, is a 37 y.o. female, DOB - Oct 08, 1985, NKN:[id number]  Admit date - 08/14/2022   Admitting Physician Maraki Macquarrie Denton Brick, MD  Outpatient Primary MD for the patient is [name], MD  LOS - 6  Chief Complaint  Patient presents with   Abdominal Pain        Brief summary -37 y.o. female  with PMH Liddle syndrome, obesity, alcoholic pancreatitis/chronic pancreatitis with pseudocyst formation with recurrent admissions for same  -Patient readmitted on 08/15/2022 with abdominal pain consistent with recurrent/acute on chronic pancreatitis --On 08/15/2022 had Conference with Dr. Gala Romney Linna Hoff) as well as Dr. Rush Landmark and  Dr. Lyndel Safe from [facility] GI--recommendations as outlined in the assessment and plan section of the admission H&P  A/p 1)Acute on chronic Pancreatitis Pseudocyst formation -Repeat CT abdomen and pelvis from 08/23/2022 shows  Unresolved pancreatic inflammation, with small but enlarging pseudocysts at both the porta hepatis and pancreatic tail with Superimposed necrosis of the distal tail. Mildly increased from last month dependent fluid and inflammation in the abdominal gutters and pelvis. --Also has mild increase of 3.5 cm pancreatic pseudocyst adjacent to pancreatic tail -GI following, appreciate assistance.  Patient has been unable to tolerate adequate oral nutrition and was recommended for initiation of TPN - PICC placed on 07/25/2022 and TPN has been started with plans for continuing TPN at home and slow diet advancement -Discussed with Dr. Rush Landmark from GI service who states pseudocyst in the pancreas tail/necrosis or possible necrosis in the tail is not something that would require absolute intervention at this point - will need to follow up with her outpatient GI, Dr. Marius Ditch as well; patient aware -She is also recommended to have 68-monthfollow-up pancreatic protocol CT per GI as well 08/21/22- --Patient was on iv dilaudid for  about 1 week Now (was on 1 mg iV every 2 hours for 6 days or so) ----she usually got it every 2 hrs  -on 08/19/22 we started to De-escalate her Iv Dilaudid slowly-- -we have titrated oral oxycodone from 5 to 10 mg as q4 hrs needed ------- -Palliative care consult to help with pain management requested -when she does go home she will Need opiates....Marland KitchenMarland KitchenMarland Kitchene need to try work towards achieving pain control with oral and maybe transdermal opiates and try to move away from iv slowly -Start Duragesic patch on 08/21/22 -Continue to increase frequency/intervals of IV Dilaudid with goal of weaning off of IV Dilaudid over the next couple days and discharging home on topical Duragesic patch and oral oxycodone only -Local GI team is seen and [facility] due to complicated pancreatitis with pseudocysts, pancreatic necrosis, new biliary ductal dilation, large stable subcapsular hematoma --patient may need ERCP/EUS in the near future  2)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 And diminutive but patent main portal vein, splenic vein, SMV. -As per Dr. MRush Landmarklarge splenic hematoma. In most cases these will decrease in size over time, but when they do not sometimes evacuation versus drainage may need to be considered (if there is any concern of infection, may need to think about aspiration of that). --Hematoma evacuation is a high risk procedure and I am not sure if the surgeons would do that unless it was increasing in size or other  issues.  - no anticoagulation as per GI due to hematoma    3)Biliary Concerns--- CT abdomen and pelvis on 08/15/2021 shows Progressed, Moderate to Severe Hepatic Biliary Ductal Dilatation since  last month. And superimposed ductal wall enhancement raises the possibility of Cholangitis. -LFTs are normal show cholangitis less likely monitor LFTs for now - Thus I do think that for now TPN remains the main steadfast for her and if she could tolerate trickle feeds in the distal duodenum via [place] that may make sense for her in an effort of trying to help things heal. But for now not sure there is much more that we would be able to do an endoscopically there is not something we would necessarily pursue right now. If the LFTs started to increase significantly and we believe that cyst was causing that, then an aspiration (not a cystgastrostomy or cystenterostomy) would be the first step where I do think EUS could be helpful. But again LFTs are normal. Not sure if that is help things or clouded the picture even more.  -Initially treated with IV Rocephin and Flagyl for now for possible cholangitis  -stopped Rocephin and Flagyl after dose on 08/19/2022 as clinically and from a lab standpoint cholangitis seems less likely---- -continue to monitor LFTs   4)Hypokalemia -Replaced potassium, magnesium 1.9   5)Essential hypertension -Continue amlodipine   6)Liddle's syndrome -Monitore BP and electrolytes  7)FEN---continue TPN......Marland Kitchenper protocol... She already has a PICC line, she will need to be discharged home on TPN  Status is: Inpatient   Disposition: The patient is from: Home              Anticipated d/c is to: Home              Anticipated d/c date is: 2 days              Patient currently is not medically stable to d/c. Barriers: Not Clinically Stable-   Code Status :  -  Code Status: Full Code   Family Communication:    NA (patient is alert, awake and coherent)   DVT Prophylaxis  :   - SCDs Place and maintain sequential compression device Start: 08/17/22 1431 Place TED hose Start: 08/17/22 1431 SCDs Start: 08/15/22 1605 Place TED hose Start: 08/15/22 1605 heparin injection 5,000  Units Start: 08/15/22 1400   Lab Results  Component Value Date   PLT 446 (H) 08/20/2022    Inpatient Medications  Scheduled Meds:  amLODipine  10 mg Oral Daily   Chlorhexidine Gluconate Cloth  6 each Topical Daily   feeding supplement  1 Container Oral TID BM   folic acid  1 mg Oral Daily   heparin  5,000 Units Subcutaneous Q8H   insulin aspart  0-9 Units Subcutaneous Q8H   lipase/protease/amylase  36,000 Units Oral TID AC   multivitamin with minerals  1 tablet Oral Daily   pantoprazole  40 mg Oral Daily   sodium chloride flush  3 mL Intravenous Q12H   sodium chloride flush  3 mL Intravenous Q12H   thiamine  100 mg Oral Daily   traZODone  150 mg Oral QHS   Continuous Infusions:  sodium chloride     sodium chloride     TPN ADULT (ION) 80 mL/hr at 08/21/22 1820   PRN Meds:.sodium chloride, sodium chloride, acetaminophen **OR** acetaminophen, bisacodyl, HYDROmorphone (DILAUDID) injection, meclizine, ondansetron **OR** ondansetron (ZOFRAN) IV, mouth rinse, oxyCODONE, sodium chloride flush, sodium chloride flush   Anti-infectives (From admission, onward)  Start     Dose/Rate Route Frequency Ordered Stop   08/15/22 1300  cefTRIAXone (ROCEPHIN) 2 g in sodium chloride 0.9 % 100 mL IVPB  Status:  Discontinued        2 g 200 mL/hr over 30 Minutes Intravenous Every 24 hours 08/15/22 0811 08/20/22 1104   08/15/22 1200  metroNIDAZOLE (FLAGYL) IVPB 500 mg  Status:  Discontinued        500 mg 100 mL/hr over 60 Minutes Intravenous Every 12 hours 08/15/22 0811 08/20/22 1104   08/15/22 1000  metroNIDAZOLE (FLAGYL) IVPB 500 mg  Status:  Discontinued        500 mg 100 mL/hr over 60 Minutes Intravenous Every 12 hours 08/15/22 0751 08/15/22 0811   08/15/22 0900  cefTRIAXone (ROCEPHIN) 2 g in sodium chloride 0.9 % 100 mL IVPB  Status:  Discontinued        2 g 200 mL/hr over 30 Minutes Intravenous Every 24 hours 08/15/22 0751 08/15/22 0811   08/15/22 0630  piperacillin-tazobactam (ZOSYN)  IVPB 3.375 g  Status:  Discontinued        3.375 g 12.5 mL/hr over 240 Minutes Intravenous  Once 08/15/22 0623 08/15/22 0800       Subjective: Jillian Shepherd today has no fevers,  ,  No chest pain,   - 08/21/22- --pain control improving with medication adjustments -Patient is willing to try advancing diet slowly -Having BMs -LPN [place] at bedside - Objective: Vitals:   08/20/22 2048 08/21/22 0453 08/21/22 0829 08/21/22 1503  BP: 99/73 114/64 100/67 129/73  Pulse: 76 82 62 85  Resp: '19 19 18 18  '$ Temp: 98.2 F (36.8 C) 98 F (36.7 C) (!) 97.4 F (36.3 C) 98.3 F (36.8 C)  TempSrc: Oral  Oral Oral  SpO2: 100% 100% 100% 99%  Weight:      Height:        Intake/Output Summary (Last 24 hours) at 08/21/2022 1934 Last data filed at 08/21/2022 1824 Gross per 24 hour  Intake 2260.53 ml  Output --  Net 2260.53 ml   Filed Weights   08/15/22 1659 08/16/22 1126 08/20/22 0413  Weight: 69.6 kg 71.4 kg 68.9 kg    Physical Exam Gen:- Awake Alert,  in no apparent distress  HEENT:- [place].AT, No sclera icterus Neck-Supple Neck,No JVD,.  Lungs-  CTAB , fair symmetrical air movement CV- S1, S2 normal, regular  Abd-  +ve B.Sounds, Abd Soft, right upper quadrant periumbilical and left upper quadrant tenderness without rebound or guarding   Extremity/Skin:- No  edema, pedal pulses present  Psych-affect is appropriate, oriented x3 Neuro-no new focal deficits, no tremors  Data Reviewed: I have personally reviewed following labs and imaging studies  CBC: Recent Labs  Lab 08/15/22 0040 08/16/22 0432 08/17/22 0430 08/20/22 0500  WBC 10.1 6.1 6.1 7.1  NEUTROABS  --   --  3.2  --   HGB 12.0 10.0* 9.9* 10.5*  HCT 36.7 32.3* 31.9* 33.7*  MCV 85.2 89.2 88.6 87.3  PLT 567* 494* 475* 263*   Basic Metabolic Panel: Recent Labs  Lab 08/15/22 0040 08/15/22 0045 08/16/22 0432 08/17/22 0430 08/18/22 0351 08/20/22 0500  NA 133*  --  138 136 136 137  K 3.2*  --  3.7 3.7 4.2 3.9  CL 98   --  107 104 103 102  CO2 24  --  '24 27 27 27  '$ GLUCOSE 99  --  60* 137* 109* 127*  BUN 10  --  '7 8 11 '$ 13  CREATININE 0.45  --  0.39* 0.35* 0.35* 0.40*  CALCIUM 8.8*  --  8.0* 8.0* 8.3* 8.4*  MG  --  1.7  --  1.9  --  1.7  PHOS  --   --   --  3.1  --  3.4   GFR: Estimated Creatinine Clearance: 89.7 mL/min (A) (by C-G formula based on SCr of 0.4 mg/dL (L)). Liver Function Tests: Recent Labs  Lab 08/15/22 0040 08/16/22 0432 08/17/22 0430 08/18/22 0351 08/20/22 0500  AST '18 20 17 15 17  '$ ALT '12 11 9 8 7  '$ ALKPHOS 92 66 67 70 62  BILITOT 0.9 0.3 <0.1* 0.3 0.2*  PROT 7.9 6.2* 6.2* 6.6 6.4*  ALBUMIN 2.8* 2.2* 2.2* 2.3* 2.2*   Radiology Studies: No results found.  Scheduled Meds:  amLODipine  10 mg Oral Daily   Chlorhexidine Gluconate Cloth  6 each Topical Daily   feeding supplement  1 Container Oral TID BM   folic acid  1 mg Oral Daily   heparin  5,000 Units Subcutaneous Q8H   insulin aspart  0-9 Units Subcutaneous Q8H   lipase/protease/amylase  36,000 Units Oral TID AC   multivitamin with minerals  1 tablet Oral Daily   pantoprazole  40 mg Oral Daily   sodium chloride flush  3 mL Intravenous Q12H   sodium chloride flush  3 mL Intravenous Q12H   thiamine  100 mg Oral Daily   traZODone  150 mg Oral QHS   Continuous Infusions:  sodium chloride     sodium chloride     TPN ADULT (ION) 80 mL/hr at 08/21/22 1820    LOS: 6 days   Roxan Hockey M.D on 08/21/2022 at 7:34 PM  Go to [email or web address] - for contact info  [facility] - Office  [phone]  If 7PM-7AM, please contact night-coverage [email or web address] 08/21/2022, 7:34 PM

## 2022-08-21 NOTE — Progress Notes (Signed)
PHARMACY - TOTAL PARENTERAL NUTRITION CONSULT NOTE   Indication:  Intolerance to enteral feeding /recurrent pancreatitis with necrosis/pseudocyst.  Patient Measurements: Height: '5\' 3"'$  (160 cm) Weight: 68.9 kg (151 lb 14.4 oz) IBW/kg (Calculated) : 52.4 TPN AdjBW (KG): 56.7 Body mass index is 26.91 kg/m.  Assessment:  Patient on home TPN and MD wants to continue. Patient admitted with pancreatitis again and worsening abdominal pain.TPN was initiated via PICC line due to intolerance to adequate PO intake at last admission 07/25/22.    Glucose / Insulin: BS 186 / 2 Electrolytes: WNL Renal: 0.4 Hepatic: Albumin 2.2 (Last labs from 1/15, labs ordered for 1/16)   Central access: PICC placed 07/25/22 TPN start date: 07/25/22   Nutritional Goals: Goal TPN rate is 80 mL/hr (provides 111 g of protein and 1808 kcals per day)   RD Assessment: Estimated Needs Total Energy Estimated Needs: 1800-2000 Total Protein Estimated Needs: 100-115g Total Fluid Estimated Needs: >/=1.8L  Current Nutrition:  Clear liquids  Plan:   Continue TPN at 27m/hr at 1800 Electrolytes in TPN: Na 734m/L, K 5034mL, Ca 5mE78m, Mg 7mEq50m and Phos 8mmol11m Cl:Ac 1:1 Add standard MVI and trace elements to TPN Continue Sensitive q8h SSI and adjust as needed  Monitor TPN labs on Mon/Thurs, Labs in AM Additional labs ordered for Wed, 1/17.  MarianAllayne StackmD Clinical Pharmacist 08/21/2022 11:02 AM

## 2022-08-22 ENCOUNTER — Telehealth (INDEPENDENT_AMBULATORY_CARE_PROVIDER_SITE_OTHER): Payer: Self-pay | Admitting: Gastroenterology

## 2022-08-22 ENCOUNTER — Encounter (INDEPENDENT_AMBULATORY_CARE_PROVIDER_SITE_OTHER): Payer: Self-pay

## 2022-08-22 DIAGNOSIS — K8591 Acute pancreatitis with uninfected necrosis, unspecified: Secondary | ICD-10-CM

## 2022-08-22 DIAGNOSIS — K859 Acute pancreatitis without necrosis or infection, unspecified: Secondary | ICD-10-CM | POA: Diagnosis not present

## 2022-08-22 DIAGNOSIS — R1013 Epigastric pain: Secondary | ICD-10-CM

## 2022-08-22 LAB — COMPREHENSIVE METABOLIC PANEL
ALT: 10 U/L (ref 0–44)
AST: 17 U/L (ref 15–41)
Albumin: 2.5 g/dL — ABNORMAL LOW (ref 3.5–5.0)
Alkaline Phosphatase: 66 U/L (ref 38–126)
Anion gap: 6 (ref 5–15)
BUN: 15 mg/dL (ref 6–20)
CO2: 28 mmol/L (ref 22–32)
Calcium: 8.7 mg/dL — ABNORMAL LOW (ref 8.9–10.3)
Chloride: 102 mmol/L (ref 98–111)
Creatinine, Ser: 0.41 mg/dL — ABNORMAL LOW (ref 0.44–1.00)
GFR, Estimated: >60 mL/min (ref >60)
Glucose, Bld: 155 mg/dL — ABNORMAL HIGH (ref 70–99)
Potassium: 3.9 mmol/L (ref 3.5–5.1)
Sodium: 136 mmol/L (ref 135–145)
Total Bilirubin: 0.2 mg/dL — ABNORMAL LOW (ref 0.3–1.2)
Total Protein: 6.8 g/dL (ref 6.5–8.1)

## 2022-08-22 LAB — GLUCOSE, CAPILLARY
Glucose-Capillary: 144 mg/dL — ABNORMAL HIGH (ref 70–99)
Glucose-Capillary: 164 mg/dL — ABNORMAL HIGH (ref 70–99)
Glucose-Capillary: 165 mg/dL — ABNORMAL HIGH (ref 70–99)
Glucose-Capillary: 188 mg/dL — ABNORMAL HIGH (ref 70–99)

## 2022-08-22 LAB — CBC
HCT: 35 % — ABNORMAL LOW (ref 36.0–46.0)
Hemoglobin: 11.3 g/dL — ABNORMAL LOW (ref 12.0–15.0)
MCH: 28 pg (ref 26.0–34.0)
MCHC: 32.3 g/dL (ref 30.0–36.0)
MCV: 86.8 fL (ref 80.0–100.0)
Platelets: 485 10*3/uL — ABNORMAL HIGH (ref 150–400)
RBC: 4.03 MIL/uL (ref 3.87–5.11)
RDW: 17 % — ABNORMAL HIGH (ref 11.5–15.5)
WBC: 7.6 10*3/uL (ref 4.0–10.5)
nRBC: 0 % (ref 0.0–0.2)

## 2022-08-22 MED ORDER — TRAVASOL 10 % IV SOLN
INTRAVENOUS | Status: DC
  Filled 2022-08-22: qty 1113.6

## 2022-08-22 NOTE — Progress Notes (Addendum)
Has ambulated in hallway today.  Received dilaudid and oxycodone this morning and oxycodone this afternoon.  Did not want any dilaudid after this morning's dose.  Has had several bms today but no nausea and tolerating diet ok.  Drinking at least half of trays and hopes to go home tomorrow. Took shower this evening.

## 2022-08-22 NOTE — Progress Notes (Signed)
Pt declines heparin and reports on last hospital admission she had a reaction to it. She did require PRN pain medications several times overnight.Double lumen PICC line is infusing without complications. Good blood return noted.  No acute events overnight. Jillian Corona Edd Fabian

## 2022-08-22 NOTE — Progress Notes (Addendum)
PHARMACY - TOTAL PARENTERAL NUTRITION CONSULT NOTE   Indication:  Intolerance to enteral feeding /recurrent pancreatitis with necrosis/pseudocyst.  Patient Measurements: Height: '5\' 3"'$  (160 cm) Weight: 68.9 kg (151 lb 14.4 oz) IBW/kg (Calculated) : 52.4 TPN AdjBW (KG): 56.7 Body mass index is 26.91 kg/m.  Assessment:  Patient on home TPN and MD wants to continue. Patient admitted with pancreatitis again and worsening abdominal pain.TPN was initiated via PICC line due to intolerance to adequate PO intake at last admission 07/25/22. On admit 08/15/22 CT  shows still inflammation and superimposed necrosis of the pancreatic tail.    Glucose / Insulin: BS 135-166 6 units given Electrolytes: WNL Renal: Scr 0.4 Hepatic: Albumin 2.2  Central access: PICC placed 07/25/22 TPN start date: 07/25/22   Nutritional Goals: Goal TPN rate is 80 mL/hr (provides 111 g of protein and 1808 kcals per day)   RD Assessment: Estimated Needs Total Energy Estimated Needs: 1800-2000 Total Protein Estimated Needs: 100-115g Total Fluid Estimated Needs: >/=1.8L  Current Nutrition:  Clear liquids  Plan:  Continue TPN at 37m/hr at 1800 Electrolytes in TPN: Na 773m/L, K 5031mL, Ca 5mE23m, Mg 7mEq47m and Phos 8mmol29m Cl:Ac 1:1 Add standard MVI and trace elements to TPN Continue Sensitive q8h SSI and adjust as needed  Monitor TPN labs on Mon/Thurs, Labs in AM  Jorene Kaylor Isac Sarnaharm D, BCPS Clinical Pharmacist 08/22/2022 8:44 AM

## 2022-08-22 NOTE — Telephone Encounter (Signed)
Spoke with [facility] and was advised need to wait until patient is discharged before we can make appt

## 2022-08-22 NOTE — Progress Notes (Signed)
Subjective: Feeling better this morning. Pain is improved, still has some mild epigastric pain. Doing well with liquid diet, did have some grits this morning and so far has done okay with these. Tolerating the boost breeze shakes as well. . Denies nausea or vomiting. She had a BM this morning, somewhat looser. Denies rectal bleeding or melena.   Patient states that she wants to keep her GI care here with [facility] for local GI care as we are familiar with her history, she does not prefer to go back to Dr. Marius Ditch. She does understand she will still need further evaluation at [facility] for her complicated pancreatitis.   Objective: Vital signs in last 24 hours: Temp:  [97.5 F (36.4 C)-98.3 F (36.8 C)] 97.5 F (36.4 C) (01/17 0516) Pulse Rate:  [75-85] 75 (01/17 0516) Resp:  [18-20] 18 (01/17 0516) BP: (109-129)/(73-79) 113/75 (01/17 0516) SpO2:  [99 %-100 %] 100 % (01/17 0516) Last BM Date : 08/21/22 General:   Alert and oriented, pleasant Head:  Normocephalic and atraumatic. Eyes:  No icterus, sclera clear. Conjuctiva pink.  Mouth:  Without lesions, mucosa pink and moist.  Heart:  S1, S2 present, no murmurs noted.  Lungs: Clear to auscultation bilaterally, without wheezing, rales, or rhonchi.  Abdomen:  Bowel sounds present, soft,non-distended. Mild TTP of epigastric area. No HSM or hernias noted. No rebound or guarding. No masses appreciated  Msk:  Symmetrical without gross deformities. Normal posture. Pulses:  Normal pulses noted. Extremities:  Without clubbing or edema. Neurologic:  Alert and  oriented x4;  grossly normal neurologically. Skin:  Warm and dry, intact without significant lesions.  Psych:  Alert and cooperative. Normal mood and affect.  Intake/Output from previous day: 01/16 0701 - 01/17 0700 In: 3285.9 [P.O.:840; I.V.:2445.9] Out: -  Intake/Output this shift: No intake/output data recorded.  Lab Results: Recent Labs    08/20/22 0500 08/22/22 0339  WBC 7.1 7.6  HGB  10.5* 11.3*  HCT 33.7* 35.0*  PLT 446* 485*   BMET Recent Labs    08/20/22 0500 08/22/22 0339  NA 137 136  K 3.9 3.9  CL 102 102  CO2 27 28  GLUCOSE 127* 155*  BUN 13 15  CREATININE 0.40* 0.41*  CALCIUM 8.4* 8.7*   LFT Recent Labs    08/20/22 0500 08/22/22 0339  PROT 6.4* 6.8  ALBUMIN 2.2* 2.5*  AST 17 17  ALT 7 10  ALKPHOS 62 66  BILITOT 0.2* 0.2*    Assessment: Jillian Shepherd is a 37 y.o. 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 Well-known to GI with recent hospitalization for recurrent pancreatitis. This is her fourth admission since 05/2022.     Recurrent pancreatitis with necrosis/pseudocyst: last admission in December 2023, TPN then initiated via PICC line due to intolerance to adequate PO intake. D/C'd on 12/22. New RUQ/Right shoulder pain prompting this admission.  Case discussed last week with advanced endoscopist Dr. Early Osmond with [facility] who reviewed the case and does not feel that pseudocyst or necrosis requires immediate intervention at this time. There was concern for cholangitis on the CT, though LFTs remain WNL, low likelihood of cholangitis given this. Patient has been covered with antibiotics with plans to discontinue after today's dose. ERCP likely would not be very beneficial at this time, as potential that pseudocyst/fluid collection is causing some  narrowing of bile duct, if LFTs start to increase and this is thought secondary to pseudocyst EUS for drainage may then be warranted.   She is feeling better today with only mild epigastric pain. Minimally tender on exam. No nausea or vomiting. Tolerated grits this morning. She prefers to establish local GI care here with our team ([facility]) vs returning to Dr.  Marius Ditch, request has been sent for referral to Dr. Jerene Pitch at [facility], will send request for hospital follow up locally with our team upon discharge until she can be seen by [facility].   Recommend palliative consult for assistance with further pain management in hopes that this can be managed further at home.    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 Mass effect on the spleen and Partial cavernous transformation of portal vein, diminutive but patent main portal vein, splenic vein, SMV. As above, case reviewed by advanced endoscopist with [facility], Dr. Rush Landmark advised in most cases these will decrease in size over time, if this does not/becomes larger, may require evacuation vs drainage, if any concern for infection, may need to consider aspiration, however evacuation of hematoma Is high risk procedure and would not be indicated unless this is becoming more of an issue.    Mild anemia, Following H/H, which has been stable (11.3 today). no rectal bleeding or melena.   Plan: Referral to [facility]-Dr. Mishra Repeat CT pancreatic protocol in 2 months, sooner if concern for intra abdominal hermorrhage/infection/rise in LFTs daily Continue with supportive measures Continue with liquid POs as tolerated  Pain and nausea management per hospitalist Appreciate palliative consult for pain management  Follow up with local GI team 2-3 weeks after d/c   LOS: 7 days    08/22/2022, 9:09 AM  Harvin Konicek L. Alver Sorrow, MSN, APRN, AGNP-C Adult-Gerontology Nurse Practitioner [facility] at [facility]

## 2022-08-22 NOTE — Progress Notes (Signed)
PROGRESS NOTE     Jillian Shepherd, is a 37 y.o. female, DOB - 1985-11-04, OEU:[id number]  Admit date - 08/14/2022   Admitting Physician Courage Denton Brick, MD  Outpatient Primary MD for the patient is [name], MD  LOS - 7  Chief Complaint  Patient presents with   Abdominal Pain        Brief summary -37 y.o. female  with PMH Liddle syndrome, obesity, alcoholic pancreatitis/chronic pancreatitis with pseudocyst formation with recurrent admissions for same  -Patient readmitted on 08/15/2022 with abdominal pain consistent with recurrent/acute on chronic pancreatitis --On 08/15/2022 had Conference with Dr. Gala Romney Linna Hoff) as well as Dr. Rush Landmark and  Dr. Lyndel Safe from [facility]--recommendations as outlined in the assessment and plan section of the admission H&P  A/p 1)Acute on chronic Pancreatitis Pseudocyst formation -Repeat CT abdomen and pelvis from 08/23/2022 shows  Unresolved pancreatic inflammation, with small but enlarging pseudocysts at both the porta hepatis and pancreatic tail with Superimposed necrosis of the distal tail. Mildly increased from last month dependent fluid and inflammation in the abdominal gutters and pelvis. --Also has mild increase of 3.5 cm pancreatic pseudocyst adjacent to pancreatic tail -GI following, appreciate assistance.  Patient has been unable to tolerate adequate oral nutrition and was recommended for initiation of TPN - PICC placed on 07/25/2022 and TPN has been started with plans for continuing TPN at home and slow diet advancement -Discussed with Dr. Rush Landmark from GI service who states pseudocyst in the pancreas tail/necrosis or possible necrosis in the tail is not something that would require absolute intervention at this point - pt to follow up with [facility]  -She is also recommended to have 28-monthfollow-up pancreatic protocol CT per GI as well --weaning down IV pain meds as tolerated -we have titrated oral oxycodone from 5 to 10 mg as q4 hrs  needed ------- -when she does go home she will Need opiates....Marland KitchenMarland KitchenMarland Kitchene need to try work towards achieving pain control with oral and maybe transdermal opiates and try to move away from iv slowly -Started Duragesic patch on 08/21/22 -Continue to increase frequency/intervals of IV Dilaudid with goal of weaning off of IV Dilaudid over the next couple days and discharging home on topical Duragesic patch and oral oxycodone only -Local GI team is seen and [facility] complicated pancreatitis with pseudocysts, pancreatic necrosis, new biliary ductal dilation, large stable subcapsular hematoma --patient may need ERCP/EUS in the near future  2)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 And diminutive but patent main portal vein, splenic vein, SMV. -As per Dr. MRush Landmarklarge splenic hematoma. In most cases these will decrease in size over time, but when they do not sometimes evacuation versus drainage may need to be considered (if there is any concern of infection, may need to think about aspiration of that). --Hematoma evacuation is a high risk procedure and I am not sure if the surgeons would do that unless it was increasing in size or other issues.  - no anticoagulation as per GI due to hematoma    3)Biliary Concerns--- CT abdomen and pelvis on 08/15/2021 shows Progressed, Moderate to Severe Hepatic Biliary Ductal Dilatation since last month. And superimposed ductal wall enhancement raises the possibility of Cholangitis. -LFTs are normal show cholangitis less likely monitor  LFTs for now - Thus I do think that for now TPN remains the main steadfast for her and if she could tolerate trickle feeds in the distal  duodenum via [place] that may make sense for her in an effort of trying to help things heal. But for now not sure there is much more that we would be able to do an endoscopically there is not something we would necessarily pursue right now. If the LFTs started to increase significantly and we believe that cyst was causing that, then an aspiration (not a cystgastrostomy or cystenterostomy) would be the first step where I do think EUS could be helpful. But again LFTs are normal. Not sure if that is help things or clouded the picture even more.  -Initially treated with IV Rocephin and Flagyl for now for possible cholangitis  -stopped Rocephin and Flagyl after dose on 08/19/2022 as clinically and from a lab standpoint cholangitis seems less likely---- -continue to monitor LFTs   4)Hypokalemia -Replaced potassium, magnesium 1.9   5)Essential hypertension -Continue amlodipine   6)Liddle's syndrome -Monitore BP and electrolytes  7)FEN---continue TPN......Marland Kitchenper protocol... She already has a PICC line, plan is to discharge home on TPN  Status is: Inpatient   Disposition: The patient is from: Home              Anticipated d/c is to: Home              Anticipated d/c date is: 2 days              Patient currently is not medically stable to d/c. Barriers: Not Clinically Stable-   Code Status :  -  Code Status: Full Code   Family Communication:    NA (patient is alert, awake and coherent)   DVT Prophylaxis  :   - SCDs Place and maintain sequential compression device Start: 08/17/22 1431 Place TED hose Start: 08/17/22 1431 SCDs Start: 08/15/22 1605 Place TED hose Start: 08/15/22 1605   Lab Results  Component Value Date   PLT 485 (H) 08/22/2022    Inpatient Medications  Scheduled Meds:  amLODipine  10 mg Oral Daily   Chlorhexidine Gluconate Cloth  6 each Topical Daily   feeding supplement  1 Container Oral TID BM   fentaNYL  1 patch Transdermal A25K   folic acid  1 mg Oral Daily    insulin aspart  0-9 Units Subcutaneous Q8H   lipase/protease/amylase  36,000 Units Oral TID AC   multivitamin with minerals  1 tablet Oral Daily   pantoprazole  40 mg Oral Daily   sodium chloride flush  3 mL Intravenous Q12H   sodium chloride flush  3 mL Intravenous Q12H   thiamine  100 mg Oral Daily   traZODone  150 mg Oral QHS   Continuous Infusions:  sodium chloride     sodium chloride     TPN ADULT (ION) 80 mL/hr at 08/22/22 0422   TPN ADULT (ION)     PRN Meds:.sodium chloride, sodium chloride, acetaminophen **OR** acetaminophen, bisacodyl, HYDROmorphone (DILAUDID) injection, meclizine, ondansetron **OR** ondansetron (ZOFRAN) IV, mouth rinse, oxyCODONE, sodium chloride flush, sodium chloride flush   Anti-infectives (From admission, onward)    Start     Dose/Rate Route Frequency Ordered Stop   08/15/22 1300  cefTRIAXone (ROCEPHIN) 2 g in sodium chloride 0.9 % 100 mL IVPB  Status:  Discontinued        2 g 200 mL/hr over 30 Minutes Intravenous Every 24 hours 08/15/22 0811 08/20/22  1104   08/15/22 1200  metroNIDAZOLE (FLAGYL) IVPB 500 mg  Status:  Discontinued        500 mg 100 mL/hr over 60 Minutes Intravenous Every 12 hours 08/15/22 0811 08/20/22 1104   08/15/22 1000  metroNIDAZOLE (FLAGYL) IVPB 500 mg  Status:  Discontinued        500 mg 100 mL/hr over 60 Minutes Intravenous Every 12 hours 08/15/22 0751 08/15/22 0811   08/15/22 0900  cefTRIAXone (ROCEPHIN) 2 g in sodium chloride 0.9 % 100 mL IVPB  Status:  Discontinued        2 g 200 mL/hr over 30 Minutes Intravenous Every 24 hours 08/15/22 0751 08/15/22 0811   08/15/22 0630  piperacillin-tazobactam (ZOSYN) IVPB 3.375 g  Status:  Discontinued        3.375 g 12.5 mL/hr over 240 Minutes Intravenous  Once 08/15/22 0623 08/15/22 0800       Subjective: Jillian Shepherd reports pain seems better controlled today; tolerating liquids so far;  - Objective: Vitals:   08/21/22 1503 08/21/22 2146 08/22/22 0516 08/22/22 0929  BP:  129/73 109/79 113/75 128/77  Pulse: 85 83 75 79  Resp: '18 20 18   '$ Temp: 98.3 F (36.8 C) 97.7 F (36.5 C) (!) 97.5 F (36.4 C)   TempSrc: Oral Oral    SpO2: 99% 100% 100%   Weight:      Height:        Intake/Output Summary (Last 24 hours) at 08/22/2022 1438 Last data filed at 08/22/2022 1300 Gross per 24 hour  Intake 3405.87 ml  Output --  Net 3405.87 ml   Filed Weights   08/15/22 1659 08/16/22 1126 08/20/22 0413  Weight: 69.6 kg 71.4 kg 68.9 kg    Physical Exam Physical Exam Vitals reviewed.  Constitutional:      Appearance: She is well-developed and normal weight. She is not ill-appearing, toxic-appearing or diaphoretic.  HENT:     Head: Normocephalic and atraumatic.     Mouth/Throat:     Mouth: Mucous membranes are moist.  Eyes:     General: No scleral icterus.    Extraocular Movements: Extraocular movements intact.     Pupils: Pupils are equal, round, and reactive to light.  Cardiovascular:     Rate and Rhythm: Normal rate and regular rhythm.  Pulmonary:     Effort: Pulmonary effort is normal. No respiratory distress.     Breath sounds: No rhonchi.  Abdominal:     General: Abdomen is flat. Bowel sounds are normal.     Palpations: Abdomen is soft.     Tenderness: There is generalized abdominal tenderness and tenderness in the epigastric area. There is no guarding.  Musculoskeletal:     Cervical back: Neck supple.  Skin:    General: Skin is warm and dry.     Capillary Refill: Capillary refill takes less than 2 seconds.  Neurological:     General: No focal deficit present.     Mental Status: She is alert.  Psychiatric:        Mood and Affect: Mood normal.   Data Reviewed: I have personally reviewed following labs and imaging studies  CBC: Recent Labs  Lab 08/16/22 0432 08/17/22 0430 08/20/22 0500 08/22/22 0339  WBC 6.1 6.1 7.1 7.6  NEUTROABS  --  3.2  --   --   HGB 10.0* 9.9* 10.5* 11.3*  HCT 32.3* 31.9* 33.7* 35.0*  MCV 89.2 88.6 87.3 86.8  PLT  494* 475* 446* 956*   Basic Metabolic  Panel: Recent Labs  Lab 08/16/22 0432 08/17/22 0430 08/18/22 0351 08/20/22 0500 08/22/22 0339  NA 138 136 136 137 136  K 3.7 3.7 4.2 3.9 3.9  CL 107 104 103 102 102  CO2 '24 27 27 27 28  '$ GLUCOSE 60* 137* 109* 127* 155*  BUN '7 8 11 13 15  '$ CREATININE 0.39* 0.35* 0.35* 0.40* 0.41*  CALCIUM 8.0* 8.0* 8.3* 8.4* 8.7*  MG  --  1.9  --  1.7  --   PHOS  --  3.1  --  3.4  --    GFR: Estimated Creatinine Clearance: 89.7 mL/min (A) (by C-G formula based on SCr of 0.41 mg/dL (L)). Liver Function Tests: Recent Labs  Lab 08/16/22 0432 08/17/22 0430 08/18/22 0351 08/20/22 0500 08/22/22 0339  AST '20 17 15 17 17  '$ ALT '11 9 8 7 10  '$ ALKPHOS 66 67 70 62 66  BILITOT 0.3 <0.1* 0.3 0.2* 0.2*  PROT 6.2* 6.2* 6.6 6.4* 6.8  ALBUMIN 2.2* 2.2* 2.3* 2.2* 2.5*   Radiology Studies: No results found.  Scheduled Meds:  amLODipine  10 mg Oral Daily   Chlorhexidine Gluconate Cloth  6 each Topical Daily   feeding supplement  1 Container Oral TID BM   fentaNYL  1 patch Transdermal H37J   folic acid  1 mg Oral Daily   insulin aspart  0-9 Units Subcutaneous Q8H   lipase/protease/amylase  36,000 Units Oral TID AC   multivitamin with minerals  1 tablet Oral Daily   pantoprazole  40 mg Oral Daily   sodium chloride flush  3 mL Intravenous Q12H   sodium chloride flush  3 mL Intravenous Q12H   thiamine  100 mg Oral Daily   traZODone  150 mg Oral QHS   Continuous Infusions:  sodium chloride     sodium chloride     TPN ADULT (ION) 80 mL/hr at 08/22/22 0422   TPN ADULT (ION)      LOS: 7 days   Irwin Brakeman M.D on 08/22/2022 at 2:38 PM  Go to [email or web address] - for contact info  [facility] - Office  [phone]  If 7PM-7AM, please contact night-coverage [email or web address] 08/22/2022, 2:38 PM

## 2022-08-23 DIAGNOSIS — K8591 Acute pancreatitis with uninfected necrosis, unspecified: Secondary | ICD-10-CM | POA: Diagnosis not present

## 2022-08-23 DIAGNOSIS — K859 Acute pancreatitis without necrosis or infection, unspecified: Secondary | ICD-10-CM | POA: Diagnosis not present

## 2022-08-23 LAB — COMPREHENSIVE METABOLIC PANEL
ALT: 12 U/L (ref 0–44)
AST: 21 U/L (ref 15–41)
Albumin: 2.6 g/dL — ABNORMAL LOW (ref 3.5–5.0)
Alkaline Phosphatase: 76 U/L (ref 38–126)
Anion gap: 7 (ref 5–15)
BUN: 16 mg/dL (ref 6–20)
CO2: 26 mmol/L (ref 22–32)
Calcium: 8.7 mg/dL — ABNORMAL LOW (ref 8.9–10.3)
Chloride: 100 mmol/L (ref 98–111)
Creatinine, Ser: 0.43 mg/dL — ABNORMAL LOW (ref 0.44–1.00)
GFR, Estimated: >60 mL/min (ref >60)
Glucose, Bld: 158 mg/dL — ABNORMAL HIGH (ref 70–99)
Potassium: 3.9 mmol/L (ref 3.5–5.1)
Sodium: 133 mmol/L — ABNORMAL LOW (ref 135–145)
Total Bilirubin: 0.2 mg/dL — ABNORMAL LOW (ref 0.3–1.2)
Total Protein: 7.3 g/dL (ref 6.5–8.1)

## 2022-08-23 LAB — GLUCOSE, CAPILLARY
Glucose-Capillary: 144 mg/dL — ABNORMAL HIGH (ref 70–99)
Glucose-Capillary: 171 mg/dL — ABNORMAL HIGH (ref 70–99)
Glucose-Capillary: 179 mg/dL — ABNORMAL HIGH (ref 70–99)
Glucose-Capillary: 185 mg/dL — ABNORMAL HIGH (ref 70–99)

## 2022-08-23 LAB — TRIGLYCERIDES: Triglycerides: 579 mg/dL — ABNORMAL HIGH (ref <150)

## 2022-08-23 LAB — MAGNESIUM: Magnesium: 1.8 mg/dL (ref 1.7–2.4)

## 2022-08-23 LAB — PHOSPHORUS: Phosphorus: 4.1 mg/dL (ref 2.5–4.6)

## 2022-08-23 MED ORDER — VITAMIN B-1 100 MG PO TABS: 100.0000 mg | ORAL_TABLET | Freq: Every day | ORAL | 0 refills | Status: DC

## 2022-08-23 MED ORDER — OXYCODONE HCL 10 MG PO TABS: 10.0000 mg | ORAL_TABLET | Freq: Four times a day (QID) | ORAL | 0 refills | Status: DC | PRN

## 2022-08-23 MED ORDER — POLYETHYLENE GLYCOL 3350 17 G PO PACK: 17.0000 g | PACK | Freq: Every day | ORAL | 1 refills | Status: DC

## 2022-08-23 MED ORDER — FENTANYL 25 MCG/HR TD PT72: 1.0000 | MEDICATED_PATCH | TRANSDERMAL | 0 refills | Status: DC

## 2022-08-23 MED ORDER — BISACODYL 10 MG RE SUPP: 10.0000 mg | Freq: Every day | RECTAL | 0 refills | Status: DC | PRN

## 2022-08-23 NOTE — Progress Notes (Signed)
PHARMACY - TOTAL PARENTERAL NUTRITION CONSULT NOTE   Indication:  Intolerance to enteral feeding /recurrent pancreatitis with necrosis/pseudocyst.  Patient Measurements: Height: '5\' 3"'$  (160 cm) Weight: 68.9 kg (151 lb 14.4 oz) IBW/kg (Calculated) : 52.4 TPN AdjBW (KG): 56.7 Body mass index is 26.91 kg/m.  Assessment:  Patient on home TPN and MD wants to continue. Patient admitted with pancreatitis again and worsening abdominal pain.TPN was initiated via PICC line due to intolerance to adequate PO intake at last admission 07/25/22. On admit 08/15/22 CT  shows still inflammation and superimposed necrosis of the pancreatic tail.    Glucose / Insulin: BS 135-166 6 units given Electrolytes: Na 133   Phos 3.4 > 4.1 Renal: Scr 0.4 Hepatic: Albumin 2.2 TG 579  Central access: PICC placed 07/25/22 TPN start date: 07/25/22   Nutritional Goals: Goal TPN rate is 80 mL/hr (provides 111 g of protein and 1808 kcals per day)   RD Assessment: Estimated Needs Total Energy Estimated Needs: 1800-2000 Total Protein Estimated Needs: 100-115g Total Fluid Estimated Needs: >/=1.8L  Current Nutrition:  Clear liquids  Boost TID started 1/15 providing 750 kcal and 27 g protein daily  Plan:  Patient discharging on TPN which will be made by outpatient. Recommended holding lipids in TPN due to TG level of 579. Add standard MVI and trace elements to TPN Continue Sensitive q8h SSI and adjust as needed  Monitor TPN labs on Mon/Thurs, Labs in AM  Margot Ables, PharmD Clinical Pharmacist 08/23/2022 10:42 AM

## 2022-08-23 NOTE — Discharge Instructions (Signed)
IMPORTANT INFORMATION: PAY CLOSE ATTENTION   PHYSICIAN DISCHARGE INSTRUCTIONS  Follow with Primary care provider  Pickard, Warren T, MD  and other consultants as instructed by your Hospitalist Physician  SEEK MEDICAL CARE OR RETURN TO EMERGENCY ROOM IF SYMPTOMS COME BACK, WORSEN OR NEW PROBLEM DEVELOPS   Please note: You were cared for by a hospitalist during your hospital stay. Every effort will be made to forward records to your primary care provider.  You can request that your primary care provider send for your hospital records if they have not received them.  Once you are discharged, your primary care physician will handle any further medical issues. Please note that NO REFILLS for any discharge medications will be authorized once you are discharged, as it is imperative that you return to your primary care physician (or establish a relationship with a primary care physician if you do not have one) for your post hospital discharge needs so that they can reassess your need for medications and monitor your lab values.  Please get a complete blood count and chemistry panel checked by your Primary MD at your next visit, and again as instructed by your Primary MD.  Get Medicines reviewed and adjusted: Please take all your medications with you for your next visit with your Primary MD  Laboratory/radiological data: Please request your Primary MD to go over all hospital tests and procedure/radiological results at the follow up, please ask your primary care provider to get all Hospital records sent to his/her office.  In some cases, they will be blood work, cultures and biopsy results pending at the time of your discharge. Please request that your primary care provider follow up on these results.  If you are diabetic, please bring your blood sugar readings with you to your follow up appointment with primary care.    Please call and make your follow up appointments as soon as possible.    Also  Note the following: If you experience worsening of your admission symptoms, develop shortness of breath, life threatening emergency, suicidal or homicidal thoughts you must seek medical attention immediately by calling 911 or calling your MD immediately  if symptoms less severe.  You must read complete instructions/literature along with all the possible adverse reactions/side effects for all the Medicines you take and that have been prescribed to you. Take any new Medicines after you have completely understood and accpet all the possible adverse reactions/side effects.   Do not drive when taking Pain medications or sleeping medications (Benzodiazepines)  Do not take more than prescribed Pain, Sleep and Anxiety Medications. It is not advisable to combine anxiety,sleep and pain medications without talking with your primary care practitioner  Special Instructions: If you have smoked or chewed Tobacco  in the last 2 yrs please stop smoking, stop any regular Alcohol  and or any Recreational drug use.  Wear Seat belts while driving.  Do not drive if taking any narcotic, mind altering or controlled substances or recreational drugs or alcohol.       

## 2022-08-23 NOTE — Progress Notes (Signed)
Discharge teaching given, no further questions from patient. Patient says her husband wont be here until around 5pm.

## 2022-08-23 NOTE — TOC Transition Note (Signed)
Transition of Care [facility]) - CM/SW Discharge Note   Patient Details  Name: Letti Towell MRN: [id number] Date of Birth: 02-12-1986  Transition of Care [facility]) CM/SW Contact:  Boneta Lucks, RN Phone Number: 08/23/2022, 12:31 PM   Clinical Narrative:   Patient medically ready to go home. CM confirmed with Pam at [facility] that patient has TPN needed for home. No other needs.   Final next level of care: Home/Self Care Barriers to Discharge: Barriers Resolved    Discharge Placement    Patient and family notified of of transfer: 08/23/22  Discharge Plan and Services Additional resources added to the After Visit Summary for       Social Determinants of Health (SDOH) Interventions SDOH Screenings   Food Insecurity: No Food Insecurity (08/15/2022)  Housing: Low Risk  (08/15/2022)  Transportation Needs: No Transportation Needs (08/15/2022)  Utilities: Not At Risk (08/15/2022)  Depression (PHQ2-9): Low Risk  (05/17/2022)  Tobacco Use: High Risk (08/16/2022)    Readmission Risk Interventions    08/21/2022    1:31 PM  Readmission Risk Prevention Plan  Transportation Screening Complete  Medication Review ([place]) Complete  PCP or Specialist appointment within 3-5 days of discharge Not Complete  HRI or [place] Complete  SW Recovery Care/Counseling Consult Complete  Palliative Care Screening Complete  [place] Not Applicable

## 2022-08-23 NOTE — Discharge Summary (Signed)
Physician Discharge Summary  Stacey Sago NVB:[id number] DOB: 1986-03-11 DOA: 08/14/2022  PCP: [name], MD GI: Pt wants to establish care now with [facility]  Admit date: 08/14/2022 Discharge date: 08/23/2022  Admitted From:  Home with [facility] Disposition: Resume home with [facility]   Recommendations for Outpatient Follow-up:  Follow up with PCP on 1/25 and follow up with GI on 1/30 as scheduled Please repeat CT pancreatic protocol in 2 months or sooner if concern for intraabdominal hemorrhage/infection/rise in LFTs Continue liquid diet as tolerated  Ambulatory referral to pain management requested Pharm D recommends holding lipids in TPN due to rise of triglycerides;  Monitor TPN labs on Mon/Thurs: CMP, Mg, Phos, Trig  Home Health: resumption of previous [facility] for TPN management, labs, PICC line care, etc.   Discharge Condition: STABLE   CODE STATUS: FULL DIET: full liquid diet    Brief Hospitalization Summary: Please see all hospital notes, images, labs for full details of the hospitalization. 37 y.o. female  with PMH Liddle syndrome, obesity, alcoholic pancreatitis/chronic pancreatitis with pseudocyst formation with recurrent admissions for same  -Patient readmitted on 08/15/2022 with abdominal pain consistent with recurrent/acute on chronic pancreatitis.   --On 08/15/2022 conference with Dr. Gala Romney Linna Hoff) as well as Dr. Rush Landmark and  Dr. Lyndel Safe from [place]  Acute on chronic Pancreatitis Pseudocyst formation -Repeat CT abdomen and pelvis from 08/23/2022 shows  Unresolved pancreatic inflammation, with small but enlarging pseudocysts at both the porta hepatis and pancreatic tail with Superimposed necrosis of the distal tail. Mildly increased from last month dependent fluid and inflammation in the abdominal gutters and pelvis. --Also has mild increase of 3.5 cm pancreatic pseudocyst adjacent to pancreatic tail -GI following, appreciate assistance.  Patient  has been unable to tolerate adequate oral nutrition and was recommended for initiation of TPN - PICC placed on 07/25/2022 and TPN has been started with plans for continuing TPN at home and slow diet advancement  -Discussed with Dr. Rush Landmark from GI service who states pseudocyst in the pancreas tail/necrosis or possible necrosis in the tail is not something that would require absolute intervention at this point  - pt to follow up with [facility] on 09/04/22 with Roseanne Kaufman   -She is also recommended to have 45-monthfollow-up pancreatic protocol CT per GI as well  -we have titrated oral oxycodone from 5 to 10 mg as q4 hrs needed; she is taking about 4 doses per day;   -Started Duragesic patch on 08/21/22 - Pt will be sent home on the following pain mgmt regimen:  one 25 mcg fentanyl patch every 3 days; oxycodone 10 mg every 6 hours PRN breakthrough pain.   -Local GI team is sending/[facility] an 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 And diminutive but patent main portal vein, splenic vein, SMV.  -As per Dr. MRush Landmarklarge splenic hematoma. In most cases these will decrease in size over time, but when they do not sometimes evacuation versus drainage may need to be considered (if there is any concern of infection, may need to think about aspiration of that). --  Hematoma evacuation is a high risk procedure and I am not sure if the surgeons would do that unless it was increasing in size or other issues.  - no  anticoagulation as per GI due to hematoma    Biliary Concerns--- CT abdomen and pelvis on 08/15/2021 shows Progressed, Moderate to Severe Hepatic Biliary Ductal Dilatation since last month.  -LFTs are normal making cholangitis less likely LFTs have been stable - Continue TPN, it remains the main steadfast for her and if she could tolerate trickle feeds in the distal duodenum via [place] that may make sense for her in an effort of trying to help things heal. But for now not sure there is much more that we would be able to do an endoscopically there is not something we would necessarily pursue right now. If the LFTs started to increase significantly and we believe that cyst was causing that, then an aspiration (not a cystgastrostomy or cystenterostomy) would be the first step where I do think EUS could be helpful. But again LFTs are normal.  -Initially treated with IV Rocephin and Flagyl for now for possible cholangitis  -stopped Rocephin and Flagyl after dose on 08/19/2022 as clinically and from a lab standpoint cholangitis seems less likely---- -LFTs have been stable    Hypokalemia -Replaced potassium, magnesium 1.9   Essential hypertension -Continue amlodipine   Liddle's syndrome -Monitor BP and electrolytes - have been stable   FEN---continue TPN......Marland Kitchenper protocol... She already has a PICC line, plan is to discharge home on TPN to resume all home health services managing her TPN, supplies, etc.      Discharge Diagnoses:  Principal Problem:   Acute recurrent pancreatitis Active Problems:   Pancreatitis, necrotizing   Abdominal pain, epigastric   Discharge Instructions: Discharge Instructions     Ambulatory referral to Pain Clinic   Complete by: As directed       Allergies as of 08/23/2022   No Known Allergies      Medication List     STOP taking these medications    metFORMIN 500 MG tablet Commonly known as: GLUCOPHAGE       TAKE these medications    amLODipine  10 MG tablet Commonly known as: NORVASC Take 1 tablet (10 mg total) by mouth daily. What changed: when to take this   bisacodyl 10 MG suppository Commonly known as: DULCOLAX Place 1 suppository (10 mg total) rectally daily as needed for moderate constipation.   cyanocobalamin 1000 MCG/ML injection Commonly known as: VITAMIN B12 Inject 1 mL (1,000 mcg total) into the muscle every 30 (thirty) days.   fentaNYL 25 MCG/HR Commonly known as: [place] 1 patch onto the skin every 3 (three) days. Start taking on: August 25, 3555   folic acid 1 MG tablet Commonly known as: FOLVITE Take 1 tablet (1 mg total) by mouth daily. What changed: when to take this   lipase/protease/amylase 36000 UNITS Cpep capsule Commonly known as: Creon Take 2 capsules (72,000 Units total) by mouth 3 (three) times daily with meals. May also take 1 capsule (36,000 Units total) as needed (with snacks).   ondansetron 4 MG tablet Commonly known as: Zofran Take 1 tablet (4 mg total) by mouth every 6 (six) hours as needed for nausea or vomiting.   Oxycodone HCl 10 MG Tabs Take 1 tablet (10 mg total) by mouth every 6 (six) hours as needed (breakthrough pain only). What changed:  how much to take when to take this reasons to take this  pantoprazole 40 MG tablet Commonly known as: PROTONIX Take 1 tablet (40 mg total) by mouth daily.   polyethylene glycol 17 g packet Commonly known as: MIRALAX / GLYCOLAX Take 17 g by mouth daily.   thiamine 100 MG tablet Commonly known as: Vitamin B-1 Take 1 tablet (100 mg total) by mouth daily. Start taking on: August 24, 2022        Follow-up Information     [name], MD. Go on 08/30/2022.   Specialty: Family Medicine Why: Hospital Follow Up Contact information: [address] [place] [address] [phone]         [place]. Go on 09/04/2022.   Why: Hospital Follow Up Contact information: [address] [place] [phone]               No Known Allergies Allergies as of 08/23/2022   No Known Allergies      Medication List     STOP taking these medications    metFORMIN 500 MG tablet Commonly known as: GLUCOPHAGE       TAKE these medications    amLODipine 10 MG tablet Commonly known as: NORVASC Take 1 tablet (10 mg total) by mouth daily. What changed: when to take this   bisacodyl 10 MG suppository Commonly known as: DULCOLAX Place 1 suppository (10 mg total) rectally daily as needed for moderate constipation.   cyanocobalamin 1000 MCG/ML injection Commonly known as: VITAMIN B12 Inject 1 mL (1,000 mcg total) into the muscle every 30 (thirty) days.   fentaNYL 25 MCG/HR Commonly known as: [place] 1 patch onto the skin every 3 (three) days. Start taking on: August 25, 1935   folic acid 1 MG tablet Commonly known as: FOLVITE Take 1 tablet (1 mg total) by mouth daily. What changed: when to take this   lipase/protease/amylase 36000 UNITS Cpep capsule Commonly known as: Creon Take 2 capsules (72,000 Units total) by mouth 3 (three) times daily with meals. May also take 1 capsule (36,000 Units total) as needed (with snacks).   ondansetron 4 MG tablet Commonly known as: Zofran Take 1 tablet (4 mg total) by mouth every 6 (six) hours as needed for nausea or vomiting.   Oxycodone HCl 10 MG Tabs Take 1 tablet (10 mg total) by mouth every 6 (six) hours as needed (breakthrough pain only). What changed:  how much to take when to take this reasons to take this   pantoprazole 40 MG tablet Commonly known as: PROTONIX Take 1 tablet (40 mg total) by mouth daily.   polyethylene glycol 17 g packet Commonly known as: MIRALAX / GLYCOLAX Take 17 g by mouth daily.   thiamine 100 MG tablet Commonly known as: Vitamin B-1 Take 1 tablet (100 mg total) by mouth daily. Start taking on: August 24, 2022         Procedures/Studies: CT ABDOMEN PELVIS W CONTRAST  Result Date: 08/15/2022 CLINICAL DATA:  37 year old female with abdominal pain. History of pancreatitis, subcapsular splenic hematoma. EXAM: CT ABDOMEN AND PELVIS WITH CONTRAST TECHNIQUE: Multidetector CT imaging of the abdomen and pelvis was performed using the standard protocol following bolus administration of intravenous contrast. RADIATION DOSE REDUCTION: This exam was performed according to the departmental dose-optimization program which includes automated exposure control, adjustment of the mA and/or kV according to patient size and/or use of iterative reconstruction technique. CONTRAST:  167m OMNIPAQUE IOHEXOL 300 MG/ML  SOLN COMPARISON:  CT Abdomen and Pelvis 07/21/2022 and earlier. FINDINGS:  Lower chest: Decreased but not resolved left pleural effusion with associated pleural thickening and enhancement compatible with organized pleural collection (less likely empyema given interval decrease). Associated round atelectasis in the left lower lobe. No pericardial effusion. Partially visible SVC vascular catheter. Mild right lung base atelectasis. No right pleural effusion. Hepatobiliary: Heterogeneous liver perfusion and worsening intrahepatic biliary ductal dilatation with increased bile duct wall enhancement and central intrahepatic bile ducts are up to 10 mm diameter now (series 2, image 25). Gallbladder also slightly more distended. And there is a small but increasing porta hepatis fluid collection which is probably a pseudo cyst measuring 19 x 27 x 26 mm now, 2 cm or smaller last month. CBD appears to taper distally. Pancreas: Unresolved peripancreatic inflammation with partial pancreatic atrophy. Organizing but irregular 2.5 to 2.8 cm pseudocyst of the pancreatic tail on series 2, image 34 and coronal image 43. Superimposed pancreatic necrosis of the distal tail (series 2, image 34). Peripancreatic inflammation. Spleen: Large subcapsular  splenic fluid collection with mixed density previously thought to be subcapsular hematoma. This is 87 x 125 by 99 mm (AP by transverse by CC) volume estimated at 500 38 mL. Size not significantly changed from last month. Mass effect on the more homogeneously enhancing splenic parenchyma as before. Adrenals/Urinary Tract: Despite regional inflammation, adrenal glands and kidneys remain within normal limits. No delayed renal images. Decompressed urinary bladder. Stomach/Bowel: No dilated large or small bowel loops. Increased free fluid and/or inflammatory stranding in both pericolic gutters since last month. No free air. Mass effect on the stomach secondary to abnormal spleen and other upper abdominal viscera. And small volume of fluid within the stomach. No discrete gastric fluid collection. Duodenum may be mildly inflamed but otherwise normal. No pneumoperitoneum identified. Vascular/Lymphatic: Some cavernous transformation of the main portal vein is redemonstrated. Diminutive main portal vein caliber, and diminutive junction with the SMV, but the mesenteric portal veins appear to remain patent. Splenic vein also remains patent on series 2, image 33. Superimposed major arterial structures in the abdomen and pelvis remain patent. Subtle aortic atherosclerosis. No lymphadenopathy identified. Reproductive: Within normal limits. Other: Small volume free fluid in the pelvis. Musculoskeletal: No acute osseous abnormality identified. IMPRESSION: 1. Unresolved pancreatic inflammation, with small but enlarging pseudocysts at both the porta hepatis (see also #2 - series 2, image 28) and pancreatic tail (image 34). Superimposed necrosis of the distal tail. Mildly increased from last month dependent fluid and inflammation in the abdominal gutters and pelvis. 2. Progressed, Moderate to Severe Hepatic Biliary Ductal Dilatation since last month. And superimposed ductal wall enhancement raises the possibility of Cholangitis. 3.  Large Splenic Subcapsular Hematoma and/or complex collection (538 mL) not significantly changed. Mass effect on the spleen. 4. Organized left pleural collection with pleural thickening at the lung base has regressed but not resolved. Associated left lower lobe atelectasis. 5. 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 And diminutive but patent main portal vein, splenic vein, SMV. Electronically Signed   By: Genevie Ann M.D.   On: 08/15/2022 05:53   [place] EKG SITE RITE  Result Date: 07/24/2022 If Site Rite image not attached, placement could not be confirmed due to current cardiac rhythm.    Subjective: Pt reports her pain has been controlled, she is tolerating full liquids and feels ready to go home today. She will follow up with PCP and with [facility] and has appts for 1/25 and 1/30.  She is agreeable to referral to pain clinic but until then will see  her PCP and [facility] for ongoing pain mgmt.  She says her PCP has been giving her prescriptions for pain mgmt.  Discharge Exam: Vitals:   08/23/22 0000 08/23/22 0618  BP: 118/69 105/77  Pulse: 79 76  Resp:  19  Temp: 98 F (36.7 C) 97.6 F (36.4 C)  SpO2: 100% 100%   Vitals:   08/22/22 0929 08/22/22 1613 08/23/22 0000 08/23/22 0618  BP: 128/77 126/78 118/69 105/77  Pulse: 79 83 79 76  Resp:    19  Temp:  97.6 F (36.4 C) 98 F (36.7 C) 97.6 F (36.4 C)  TempSrc:  Oral Oral Oral  SpO2:  100% 100% 100%  Weight:      Height:       General: Pt is alert, awake, not in acute distress Cardiovascular: RRR, S1/S2 +, no rubs, no gallops Respiratory: CTA bilaterally, no wheezing, no rhonchi Abdominal: Soft, NT, ND, bowel sounds + Extremities: no edema, no cyanosis   The results of significant diagnostics from this hospitalization (including imaging, microbiology, ancillary and laboratory) are listed below for reference.     Microbiology: No results found for this or any previous visit (from the past 240 hour(s)).   Labs: BNP (last 3  results) Recent Labs    05/12/22 0730  BNP 03.5   Basic Metabolic Panel: Recent Labs  Lab 08/17/22 0430 08/18/22 0351 08/20/22 0500 08/22/22 0339 08/23/22 0637  NA 136 136 137 136 133*  K 3.7 4.2 3.9 3.9 3.9  CL 104 103 102 102 100  CO2 '27 27 27 28 26  '$ GLUCOSE 137* 109* 127* 155* 158*  BUN '8 11 13 15 16  '$ CREATININE 0.35* 0.35* 0.40* 0.41* 0.43*  CALCIUM 8.0* 8.3* 8.4* 8.7* 8.7*  MG 1.9  --  1.7  --  1.8  PHOS 3.1  --  3.4  --  4.1   Liver Function Tests: Recent Labs  Lab 08/17/22 0430 08/18/22 0351 08/20/22 0500 08/22/22 0339 08/23/22 0637  AST '17 15 17 17 21  '$ ALT '9 8 7 10 12  '$ ALKPHOS 67 70 62 66 76  BILITOT <0.1* 0.3 0.2* 0.2* 0.2*  PROT 6.2* 6.6 6.4* 6.8 7.3  ALBUMIN 2.2* 2.3* 2.2* 2.5* 2.6*   No results for input(s): "LIPASE", "AMYLASE" in the last 168 hours. No results for input(s): "AMMONIA" in the last 168 hours. CBC: Recent Labs  Lab 08/17/22 0430 08/20/22 0500 08/22/22 0339  WBC 6.1 7.1 7.6  NEUTROABS 3.2  --   --   HGB 9.9* 10.5* 11.3*  HCT 31.9* 33.7* 35.0*  MCV 88.6 87.3 86.8  PLT 475* 446* 485*   Cardiac Enzymes: No results for input(s): "CKTOTAL", "CKMB", "CKMBINDEX", "TROPONINI" in the last 168 hours. BNP: Invalid input(s): "POCBNP" CBG: Recent Labs  Lab 08/22/22 0737 08/22/22 1131 08/22/22 1646 08/23/22 0028 08/23/22 0620  GLUCAP 165* 144* 164* 179* 185*   D-Dimer No results for input(s): "DDIMER" in the last 72 hours. Hgb A1c No results for input(s): "HGBA1C" in the last 72 hours. Lipid Profile Recent Labs    08/23/22 0358  TRIG 579*   Thyroid function studies No results for input(s): "TSH", "T4TOTAL", "T3FREE", "THYROIDAB" in the last 72 hours.  Invalid input(s): "FREET3" Anemia work up No results for input(s): "VITAMINB12", "FOLATE", "FERRITIN", "TIBC", "IRON", "RETICCTPCT" in the last 72 hours. Urinalysis    Component Value Date/Time   COLORURINE AMBER (A) 08/15/2022 0400   APPEARANCEUR HAZY (A) 08/15/2022  0400   LABSPEC 1.030 08/15/2022 0400   PHURINE  5.0 08/15/2022 0400   GLUCOSEU NEGATIVE 08/15/2022 0400   HGBUR NEGATIVE 08/15/2022 0400   BILIRUBINUR NEGATIVE 08/15/2022 0400   KETONESUR 80 (A) 08/15/2022 0400   PROTEINUR 30 (A) 08/15/2022 0400   UROBILINOGEN 0.2 09/28/2007 0450   NITRITE NEGATIVE 08/15/2022 0400   LEUKOCYTESUR NEGATIVE 08/15/2022 0400   Sepsis Labs Recent Labs  Lab 08/17/22 0430 08/20/22 0500 08/22/22 0339  WBC 6.1 7.1 7.6   Microbiology No results found for this or any previous visit (from the past 240 hour(s)).  Time coordinating discharge: 55 mins   SIGNED:  Irwin Brakeman, MD  [facility] 08/23/2022, 11:20 AM How to contact the [facility] Attending or Consulting provider [place] or covering provider during after hours [facility], for this patient?  Check the care team in [facility] and look for a) attending/consulting [facility] provider listed and b) the [facility] team listed Log into [email or web address] and use [place]'s universal password to access. If you do not have the password, please contact the hospital operator. Locate the [facility] provider you are looking for under [facility] and page to a number that you can be directly reached. If you still have difficulty reaching the provider, please page the [facility] (Director on Call) for the Hospitalists listed on amion for assistance.

## 2022-08-24 ENCOUNTER — Telehealth: Payer: Self-pay

## 2022-08-24 NOTE — Telephone Encounter (Signed)
Per Chelsea's note looks like pt wants to establish care here.

## 2022-08-24 NOTE — Telephone Encounter (Signed)
Transition Care Management Unsuccessful Follow-up Telephone Call  Date of discharge and from where:  [facility] 08/23/2022  Attempts:  1st Attempt  Reason for unsuccessful TCM follow-up call:  Left voice message Juanda Crumble, [place] Direct Dial [phone]

## 2022-08-24 NOTE — Telephone Encounter (Signed)
She has been discharged. She will continue her care here locally but she still needs referral to Dr. Jerene Pitch as we don't offer potential procedures she would need.

## 2022-08-27 NOTE — Telephone Encounter (Signed)
Follow up appt requested. See progress note from 08/22/21

## 2022-08-27 NOTE — Telephone Encounter (Signed)
Hi Jillian Shepherd, This patient has a follow up with you on 1/30. She was prescribed fentanyl patch - One patch every 3 days #10 in hospital for pancreatitis. Sound like med needs PA but sense it was prescribed in hospital patient unable to get in touch with dr there. Patient is wanting to get this medication. I called her and she states she is doing a little better but still having pain and reports the patches do help. ( Is this something I could call pharmacy on and have them send here for me to work on [place] since hospitalist prescribed or do you recommend something else)   [phone].

## 2022-08-27 NOTE — Telephone Encounter (Signed)
Transition Care Management Follow-up Telephone Call Date of discharge and from where: [facility] 08/23/2022 How have you been since you were released from the hospital? Still having pain Any questions or concerns? No  Items Reviewed: Did the pt receive and understand the discharge instructions provided? Yes  Medications obtained and verified? Yes  Other? No  Any new allergies since your discharge? No  Dietary orders reviewed? Yes Do you have support at home? Yes   Home Care and Equipment/Supplies: Were home health services ordered? no If so, what is the name of the agency? N/a  Has the agency set up a time to come to the patient's home? no Were any new equipment or medical supplies ordered?  No What is the name of the medical supply agency? N/a Were you able to get the supplies/equipment? no Do you have any questions related to the use of the equipment or supplies? No  Functional Questionnaire: (I = Independent and D = Dependent) ADLs:  I  Bathing/Dressing- I  Meal Prep- I  Eating- I  Maintaining continence- I  Transferring/Ambulation- I  Managing Meds- I  Follow up appointments reviewed:  PCP Hospital f/u appt confirmed? Yes  Scheduled to see Dr Dennard Schaumann on 08/30/2022 @ 12:00. [place] [facility] f/u appt confirmed? No  Are transportation arrangements needed? No  If their condition worsens, is the pt aware to call PCP or go to the Emergency Dept.? Yes Was the patient provided with contact information for the PCP's office or ED? Yes Was to pt encouraged to call back with questions or concerns? Yes .lh

## 2022-08-27 NOTE — Telephone Encounter (Signed)
Left message to return call 

## 2022-08-27 NOTE — Telephone Encounter (Signed)
Called pharmacy to get key sent here so prior auth could be worked on and was told they could not send to office if ordering provider was not at that office.

## 2022-08-27 NOTE — Telephone Encounter (Signed)
Referral faxed to Dr. Jerene Pitch

## 2022-08-27 NOTE — Addendum Note (Signed)
Addended by: Cheron Every on: 08/27/2022 10:15 AM   Modules accepted: Orders

## 2022-08-27 NOTE — Telephone Encounter (Signed)
Hi, wendy! I can help figure this out. The hospitalist won't be able to help as much. She does need the patch every 3 days for now. The pharmacy can send PA here. I can help you or Dena, whomever gets it. Thanks!

## 2022-08-28 NOTE — Telephone Encounter (Signed)
I feel most comfortable with her just continuing the oxycodone tablets. I believe she has been referred to Pain Management, which will be very helpful for her.

## 2022-08-30 ENCOUNTER — Ambulatory Visit (INDEPENDENT_AMBULATORY_CARE_PROVIDER_SITE_OTHER): Payer: 59 | Admitting: Family Medicine

## 2022-08-30 ENCOUNTER — Encounter: Payer: Self-pay | Admitting: Family Medicine

## 2022-08-30 VITALS — BP 114/70 | HR 89 | Temp 98.1°F | Ht 63.0 in | Wt 149.0 lb

## 2022-08-30 DIAGNOSIS — D735 Infarction of spleen: Secondary | ICD-10-CM

## 2022-08-30 DIAGNOSIS — K861 Other chronic pancreatitis: Secondary | ICD-10-CM | POA: Diagnosis not present

## 2022-08-30 DIAGNOSIS — I82890 Acute embolism and thrombosis of other specified veins: Secondary | ICD-10-CM | POA: Diagnosis not present

## 2022-08-30 DIAGNOSIS — K852 Alcohol induced acute pancreatitis without necrosis or infection: Secondary | ICD-10-CM | POA: Diagnosis not present

## 2022-08-30 MED ORDER — OXYCODONE HCL 10 MG PO TABS: 10.0000 mg | ORAL_TABLET | Freq: Four times a day (QID) | ORAL | 0 refills | Status: DC | PRN

## 2022-08-30 MED ORDER — ONDANSETRON HCL 4 MG PO TABS: 4.0000 mg | ORAL_TABLET | Freq: Four times a day (QID) | ORAL | 0 refills | Status: DC | PRN

## 2022-08-30 MED ORDER — VALACYCLOVIR HCL 1 G PO TABS: 1000.0000 mg | ORAL_TABLET | Freq: Three times a day (TID) | ORAL | 0 refills | Status: DC

## 2022-08-30 MED ORDER — HYDROXYZINE PAMOATE 25 MG PO CAPS: 25.0000 mg | ORAL_CAPSULE | Freq: Three times a day (TID) | ORAL | 0 refills | Status: DC | PRN

## 2022-08-30 NOTE — Progress Notes (Signed)
Subjective:    Patient ID: Jillian Shepherd, female    DOB: [birth year], 37 y.o.   MRN: [id number]  HPI PCP: [name], MD GI: Pt wants to establish care now with [facility]   Admit date: 08/14/2022 Discharge date: 08/23/2022   Admitted From:  Home with [facility] Disposition: Resume home with [facility]    Recommendations for Outpatient Follow-up:  Follow up with PCP on 1/25 and follow up with GI on 1/30 as scheduled Please repeat CT pancreatic protocol in 2 months or sooner if concern for intraabdominal hemorrhage/infection/rise in LFTs Continue liquid diet as tolerated  Ambulatory referral to pain management requested Pharm D recommends holding lipids in TPN due to rise of triglycerides;  Monitor TPN labs on Mon/Thurs: CMP, Mg, Phos, Trig   Home Health: resumption of previous [facility] for TPN management, labs, PICC line care, etc.    Discharge Condition: STABLE   CODE STATUS: FULL DIET: full liquid diet     Brief Hospitalization Summary: Please see all hospital notes, images, labs for full details of the hospitalization. 37 y.o. female  with PMH Liddle syndrome, obesity, alcoholic pancreatitis/chronic pancreatitis with pseudocyst formation with recurrent admissions for same  -Patient readmitted on 08/15/2022 with abdominal pain consistent with recurrent/acute on chronic pancreatitis.   --On 08/15/2022 conference with Dr. Gala Romney Linna Hoff) as well as Dr. Rush Landmark and  Dr. Lyndel Safe from [place]   Acute on chronic Pancreatitis Pseudocyst formation -Repeat CT abdomen and pelvis from 08/23/2022 shows  Unresolved pancreatic inflammation, with small but enlarging pseudocysts at both the porta hepatis and pancreatic tail with Superimposed necrosis of the distal tail. Mildly increased from last month dependent fluid and inflammation in the abdominal gutters and pelvis. --Also has mild increase of 3.5 cm pancreatic pseudocyst adjacent to pancreatic tail -GI following,  appreciate assistance.  Patient has been unable to tolerate adequate oral nutrition and was recommended for initiation of TPN - PICC placed on 07/25/2022 and TPN has been started with plans for continuing TPN at home and slow diet advancement   -Discussed with Dr. Rush Landmark from GI service who states pseudocyst in the pancreas tail/necrosis or possible necrosis in the tail is not something that would require absolute intervention at this point   - pt to follow up with [place] on 09/04/22 with Roseanne Kaufman    -She is also recommended to have 64-monthfollow-up pancreatic protocol CT per GI as well   -we have titrated oral oxycodone from 5 to 10 mg as q4 hrs needed; she is taking about 4 doses per day;    -Started Duragesic patch on 08/21/22 - Pt will be sent home on the following pain mgmt regimen:  one 25 mcg fentanyl patch every 3 days; oxycodone 10 mg every 6 hours PRN breakthrough pain.    -Local GI team is sending/requesting an [facility] due to complicated pancreatitis with pseudocysts, pancreatic necrosis, new biliary ductal dilation, large stable subcapsular hematoma --patient may need ERCP/EUS in the near future   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 And diminutive but patent main portal vein, splenic vein, SMV.   -As per Dr. MRush Landmarklarge splenic hematoma. In most cases these will decrease in size over time, but when they  do not sometimes evacuation versus drainage may need to be considered (if there is any concern of infection, may need to think about aspiration of that). --Hematoma evacuation is a high risk procedure and I am not sure if the surgeons would do that unless it was increasing in  size or other issues.  - no anticoagulation as per GI due to hematoma    Biliary Concerns--- CT abdomen and pelvis on 08/15/2021 shows Progressed, Moderate to Severe Hepatic Biliary Ductal Dilatation since last month.  -LFTs are normal making cholangitis less likely LFTs have been stable - Continue TPN, it remains the main steadfast for her and if she could tolerate trickle feeds in the distal duodenum via [place] that may make sense for her in an effort of trying to help things heal. But for now not sure there is much more that we would be able to do an endoscopically there is not something we would necessarily pursue right now. If the LFTs started to increase significantly and we believe that cyst was causing that, then an aspiration (not a cystgastrostomy or cystenterostomy) would be the first step where I do think EUS could be helpful. But again LFTs are normal.  -Initially treated with IV Rocephin and Flagyl for now for possible cholangitis  -stopped Rocephin and Flagyl after dose on 08/19/2022 as clinically and from a lab standpoint cholangitis seems less likely---- -LFTs have been stable    Hypokalemia -Replaced potassium, magnesium 1.9   Essential hypertension -Continue amlodipine   Liddle's syndrome -Monitor BP and electrolytes - have been stable   FEN---continue TPN......Marland Kitchenper protocol... She already has a PICC line, plan is to discharge home on TPN to resume all home health services managing her TPN, supplies, etc.      08/30/22 Since I last saw the patient, she has been admitted several times due to her chronic pancreatitis.  She has abstained from alcohol ever since her first admission in October however as soon as she goes home and eats, she decompensates and returns to the hospital unable to eat with intractable abdominal pain.  At her most recent hospitalization, she was started on TPN.  Home health nursing is managing her TPN.  This through a PICC line.  She was also started on a  fentanyl patch micrograms every 72 hours with 10 mg of oxycodone every 6 hours for breakthrough pain.  On his current regimen her pain is controlled and she has been able to stay home from the hospital for 1 week.  She will occasionally eat broth but that is all.  She is taking Creon prior to eating anything.  She eats seldom.  She has an appointment to see gastroenterology next week.  As dictated above, the patient has an enlarging pancreatic pseudocyst as well as necrosis of the pancreatic tail.  I will defer management of this to GI and their expert recommendations.  She has not yet heard from a pain clinic.  She is requesting a refill on her oxycodone today.  She is only able to afford 7 days at the present time.  She is taking it every 6 hours with  Has not Past Medical History:  Diagnosis Date   Abdominal pain    Alcohol abuse    B12 deficiency    Cancer ([place])    squmaous cell skin cancer    Collagen vascular disease (HCC)    Hypokalemia    Liddle's syndrome    Obesity (BMI 35.0-39.9 without comorbidity)    Pancreatitis  Pleural effusion    Splenic hemorrhage    Past Surgical History:  Procedure Laterality Date   ADENOIDECTOMY     CHEST TUBE INSERTION     COLONOSCOPY WITH PROPOFOL N/A 10/06/2020   Procedure: COLONOSCOPY WITH PROPOFOL;  Surgeon: Lin Landsman, MD;  Location: [facility] ENDOSCOPY;  Service: Gastroenterology;  Laterality: N/A;  COVID POSITIVE 08/23/2020   ESOPHAGOGASTRODUODENOSCOPY (EGD) WITH PROPOFOL N/A 10/06/2020   Procedure: ESOPHAGOGASTRODUODENOSCOPY (EGD) WITH PROPOFOL;  Surgeon: Lin Landsman, MD;  Location: [place];  Service: Gastroenterology;  Laterality: N/A;   skin cancer removal Left    leg   TONSILLECTOMY     Current Outpatient Medications on File Prior to Visit  Medication Sig Dispense Refill   amLODipine (NORVASC) 10 MG tablet Take 1 tablet (10 mg total) by mouth daily. (Patient taking differently: Take 10 mg by mouth in the morning.)  90 tablet 3   bisacodyl (DULCOLAX) 10 MG suppository Place 1 suppository (10 mg total) rectally daily as needed for moderate constipation. 12 suppository 0   cyanocobalamin (VITAMIN B12) 1000 MCG/ML injection Inject 1 mL (1,000 mcg total) into the muscle every 30 (thirty) days. 1 mL 11   fentaNYL (DURAGESIC) 25 MCG/HR Place 1 patch onto the skin every 3 (three) days. 10 patch 0   folic acid (FOLVITE) 1 MG tablet Take 1 tablet (1 mg total) by mouth daily. (Patient taking differently: Take 1 mg by mouth in the morning.) 30 tablet 11   KLOR-CON M20 20 MEQ tablet Take 60 mEq by mouth daily.     lipase/protease/amylase (CREON) 36000 UNITS CPEP capsule Take 2 capsules (72,000 Units total) by mouth 3 (three) times daily with meals. May also take 1 capsule (36,000 Units total) as needed (with snacks). 240 capsule 11   polyethylene glycol (MIRALAX / GLYCOLAX) 17 g packet Take 17 g by mouth daily. 30 packet 1   spironolactone (ALDACTONE) 25 MG tablet Take 25 mg by mouth 2 (two) times daily.     thiamine (VITAMIN B-1) 100 MG tablet Take 1 tablet (100 mg total) by mouth daily. 30 tablet 0   pantoprazole (PROTONIX) 40 MG tablet Take 1 tablet (40 mg total) by mouth daily. 30 tablet 0   No current facility-administered medications on file prior to visit.   No Known Allergies Social History   Socioeconomic History   Marital status: Married    Spouse name: Not on file   Number of children: Not on file   Years of education: Not on file   Highest education level: Not on file  Occupational History   Not on file  Tobacco Use   Smoking status: Every Day    Packs/day: 0.25    Years: 9.00    Total pack years: 2.25    Types: Cigarettes   Smokeless tobacco: Never  Vaping Use   Vaping Use: Never used  Substance and Sexual Activity   Alcohol use: Yes    Alcohol/week: 2.0 standard drinks of alcohol    Types: 2 Cans of beer per week   Drug use: No   Sexual activity: Yes    Birth control/protection: None   Other Topics Concern   Not on file  Social History Narrative   ** Merged History Encounter **       Social Determinants of Health   Financial Resource Strain: Not on file  Food Insecurity: No Food Insecurity (08/15/2022)   Hunger Vital Sign    Worried About Running Out of Food in the Last Year:  Never true    Ran Out of Food in the Last Year: Never true  Transportation Needs: No Transportation Needs (08/15/2022)   PRAPARE - Hydrologist (Medical): No    Lack of Transportation (Non-Medical): No  Physical Activity: Not on file  Stress: Not on file  Social Connections: Not on file  Intimate Partner Violence: Not At Risk (08/15/2022)   Humiliation, Afraid, Rape, and Kick questionnaire    Fear of Current or Ex-Partner: No    Emotionally Abused: No    Physically Abused: No    Sexually Abused: No     Review of Systems  All other systems reviewed and are negative.      Objective:   Physical Exam Vitals reviewed.  Constitutional:      General: She is not in acute distress.    Appearance: Normal appearance. She is normal weight. She is not ill-appearing, toxic-appearing or diaphoretic.  HENT:     Nose: Nose normal. No congestion or rhinorrhea.     Mouth/Throat:     Pharynx: Oropharynx is clear. No oropharyngeal exudate or posterior oropharyngeal erythema.  Eyes:     General: No scleral icterus.    Conjunctiva/sclera: Conjunctivae normal.  Cardiovascular:     Rate and Rhythm: Normal rate and regular rhythm.     Pulses: Normal pulses.     Heart sounds: Normal heart sounds. No murmur heard.    No friction rub. No gallop.  Pulmonary:     Effort: Pulmonary effort is normal. No respiratory distress.     Breath sounds: Normal breath sounds. No stridor. No wheezing, rhonchi or rales.  Chest:     Chest wall: No tenderness.  Abdominal:     General: Abdomen is flat. Bowel sounds are normal. There is no distension.     Palpations: Abdomen is soft. There  is no mass.     Tenderness: There is abdominal tenderness. There is no right CVA tenderness, left CVA tenderness, guarding or rebound.     Hernia: No hernia is present.  Musculoskeletal:     Cervical back: Neck supple.  Neurological:     Mental Status: She is alert.           Assessment & Plan:  Chronic pancreatitis, unspecified pancreatitis type ([place])  Alcohol-induced acute pancreatitis, unspecified complication status  Splenic hemorrhage  Splenic vein thrombosis Patient has been unable to resume anticoagulation due to hematoma and GI recommendations.  At the present time she is no longer on Xarelto.  Gastroenterology has an appointment to see her next week.  They are following the pancreatic pseudoyst as well as the pancreatic necrosis seen in the tail of the pancreas.  At the present time the plan is to gradually advance her diet as tolerated.  The patient is dependent on TPN at the present time.  She is eating very little, at most sips of fluid.  She is using pancreatic enzyme supplementation when she does eat.  She is requiring fentanyl 25 mcg every 72 hours coupled with oxycodone every 6 hours.  I will consult the pain clinic to help with management of this.  Her last labs were checked earlier this week by the TPN nurse.  Liver function tests were normal 1 week ago.  Await gastroenterology's recommendations from their appointment next week.  Slowly advance diet as tolerated.  7-monthfollow-up pancreatic protocol CT per GI as well

## 2022-09-03 ENCOUNTER — Ambulatory Visit: Payer: 59 | Admitting: Gastroenterology

## 2022-09-04 ENCOUNTER — Encounter: Payer: Self-pay | Admitting: Gastroenterology

## 2022-09-04 ENCOUNTER — Ambulatory Visit (INDEPENDENT_AMBULATORY_CARE_PROVIDER_SITE_OTHER): Payer: 59 | Admitting: Gastroenterology

## 2022-09-04 VITALS — BP 109/74 | HR 87 | Temp 97.9°F | Ht 63.0 in | Wt 151.2 lb

## 2022-09-04 DIAGNOSIS — K8591 Acute pancreatitis with uninfected necrosis, unspecified: Secondary | ICD-10-CM

## 2022-09-04 NOTE — Patient Instructions (Signed)
Continue with your current regimen.  Please call us if any acute recurrent pain.   [facility] is in process of obtaining the appointment for you! We are checking on this.  We will see you back in 6-8 weeks!  I enjoyed seeing you again today! At our first visit, I mentioned how I value our relationship and want to provide genuine, compassionate, and quality care. You may receive a survey regarding your visit with me, and I welcome your feedback! Thanks so much for taking the time to complete this. I look forward to seeing you again.   Annitta Needs, PhD, ANP-BC [facility] Gastroenterology

## 2022-09-04 NOTE — Progress Notes (Signed)
Gastroenterology Office Note     Primary Care Physician:  [name], MD  Primary Gastroenterologist: previously Dr. Marius Ditch   Chief Complaint   Chief Complaint  Patient presents with   Hospitalization Follow-up    Hospitalization follow up on pancreatitis. Feeling better. Having some abdominal pain and nausea. On liquid diet. Has shingles on her neck.      History of Present Illness   Jillian Shepherd is a 37 y.o. female presenting today in follow-up with a history of ETOH abuse (none since 31/4970) complicated by recurrent alcoholic pancreatitis with necrosis and pseudocysts, non-occlusive thrombus of portal vein/splenic vein thrombosis/splenic hematoma but not candidate for anticoagulation due to hematoma. She is on TPN currently, which was started during last hospitalization.   Upcoming appt 10/11/22 at [facility] with Dr. Jiles Crocker.    TPN little over a month. Abdominal pain improved. Still waxes and wanes. Nausea frequently. Zofran every 6 hours as needed. Fentanyl patch. Oxycodone 10 mg every 6 hours as needed. Takes Creon 72,000 units if eating something. Tries to eat jello, chicken broth. Grits made her feel uncomfortable.   No significant constipation.   Past Medical History:  Diagnosis Date   Abdominal pain    Alcohol abuse    B12 deficiency    Cancer (HCC)    squmaous cell skin cancer    Collagen vascular disease (HCC)    Hypokalemia    Liddle's syndrome    Obesity (BMI 35.0-39.9 without comorbidity)    Pancreatitis    Pleural effusion    Splenic hemorrhage     Past Surgical History:  Procedure Laterality Date   ADENOIDECTOMY     CHEST TUBE INSERTION     COLONOSCOPY WITH PROPOFOL N/A 10/06/2020   Procedure: COLONOSCOPY WITH PROPOFOL;  Surgeon: Lin Landsman, MD;  Location: [facility] ENDOSCOPY;  Service: Gastroenterology;  Laterality: N/A;  COVID POSITIVE 08/23/2020   ESOPHAGOGASTRODUODENOSCOPY (EGD) WITH PROPOFOL N/A 10/06/2020   Procedure:  ESOPHAGOGASTRODUODENOSCOPY (EGD) WITH PROPOFOL;  Surgeon: Lin Landsman, MD;  Location: [place];  Service: Gastroenterology;  Laterality: N/A;   skin cancer removal Left    leg   TONSILLECTOMY      Current Outpatient Medications  Medication Sig Dispense Refill   amLODipine (NORVASC) 10 MG tablet Take 1 tablet (10 mg total) by mouth daily. (Patient taking differently: Take 10 mg by mouth in the morning.) 90 tablet 3   bisacodyl (DULCOLAX) 10 MG suppository Place 1 suppository (10 mg total) rectally daily as needed for moderate constipation. 12 suppository 0   cyanocobalamin (VITAMIN B12) 1000 MCG/ML injection Inject 1 mL (1,000 mcg total) into the muscle every 30 (thirty) days. 1 mL 11   fentaNYL (DURAGESIC) 25 MCG/HR Place 1 patch onto the skin every 3 (three) days. 10 patch 0   folic acid (FOLVITE) 1 MG tablet Take 1 tablet (1 mg total) by mouth daily. (Patient taking differently: Take 1 mg by mouth in the morning.) 30 tablet 11   hydrOXYzine (VISTARIL) 25 MG capsule Take 1 capsule (25 mg total) by mouth every 8 (eight) hours as needed (for sleep). 30 capsule 0   lipase/protease/amylase (CREON) 36000 UNITS CPEP capsule Take 2 capsules (72,000 Units total) by mouth 3 (three) times daily with meals. May also take 1 capsule (36,000 Units total) as needed (with snacks). 240 capsule 11   ondansetron (ZOFRAN) 4 MG tablet Take 1 tablet (4 mg total) by mouth every 6 (six) hours as needed for nausea or vomiting. 30 tablet  0   Oxycodone HCl 10 MG TABS Take 1 tablet (10 mg total) by mouth every 6 (six) hours as needed (breakthrough pain only). 30 tablet 0   pantoprazole (PROTONIX) 40 MG tablet Take 1 tablet (40 mg total) by mouth daily. 30 tablet 0   spironolactone (ALDACTONE) 25 MG tablet Take 25 mg by mouth 2 (two) times daily.     valACYclovir (VALTREX) 1000 MG tablet Take 1 tablet (1,000 mg total) by mouth 3 (three) times daily. 21 tablet 0   No current facility-administered medications  for this visit.    Allergies as of 09/04/2022   (No Known Allergies)    Family History  Problem Relation Age of Onset   Hypertension Father    Anxiety disorder Father    Depression Father    Melanoma Paternal Grandmother        of skin   Diabetes Paternal Grandmother    Cirrhosis Paternal Uncle     Social History   Socioeconomic History   Marital status: Married    Spouse name: Not on file   Number of children: Not on file   Years of education: Not on file   Highest education level: Not on file  Occupational History   Not on file  Tobacco Use   Smoking status: Every Day    Packs/day: 0.25    Years: 9.00    Total pack years: 2.25    Types: Cigarettes    Passive exposure: Current   Smokeless tobacco: Never  Vaping Use   Vaping Use: Never used  Substance and Sexual Activity   Alcohol use: Yes    Alcohol/week: 2.0 standard drinks of alcohol    Types: 2 Cans of beer per week   Drug use: No   Sexual activity: Yes    Birth control/protection: None  Other Topics Concern   Not on file  Social History Narrative   ** Merged History Encounter **       Social Determinants of Health   Financial Resource Strain: Not on file  Food Insecurity: No Food Insecurity (08/15/2022)   Hunger Vital Sign    Worried About Running Out of Food in the Last Year: Never true    Ran Out of Food in the Last Year: Never true  Transportation Needs: No Transportation Needs (08/15/2022)   PRAPARE - Hydrologist (Medical): No    Lack of Transportation (Non-Medical): No  Physical Activity: Not on file  Stress: Not on file  Social Connections: Not on file  Intimate Partner Violence: Not At Risk (08/15/2022)   Humiliation, Afraid, Rape, and Kick questionnaire    Fear of Current or Ex-Partner: No    Emotionally Abused: No    Physically Abused: No    Sexually Abused: No     Review of Systems   Gen: Denies any fever, chills, fatigue, weight loss, lack of  appetite.  CV: Denies chest pain, heart palpitations, peripheral edema, syncope.  Resp: Denies shortness of breath at rest or with exertion. Denies wheezing or cough.  GI: Denies dysphagia or odynophagia. Denies jaundice, hematemesis, fecal incontinence. GU : Denies urinary burning, urinary frequency, urinary hesitancy MS: Denies joint pain, muscle weakness, cramps, or limitation of movement.  Derm: Denies rash, itching, dry skin Psych: Denies depression, anxiety, memory loss, and confusion Heme: Denies bruising, bleeding, and enlarged lymph nodes.   Physical Exam   BP 109/74 (BP Location: Left Arm, Patient Position: Sitting, Cuff Size: Normal)   Pulse 87  Temp 97.9 F (36.6 C) (Oral)   Ht '5\' 3"'$  (1.6 m)   Wt 151 lb 3.2 oz (68.6 kg)   BMI 26.78 kg/m  General:   Alert and oriented. Pleasant and cooperative. Well-nourished and well-developed.  Head:  Normocephalic and atraumatic. Eyes:  Without icterus Abdomen:  +BS, soft, mildly TTP epigastric and left-sided abdomen and non-distended. No HSM noted. No guarding or rebound. No masses appreciated.  Rectal:  Deferred  Msk:  Symmetrical without gross deformities. Normal posture. Extremities:  Without edema. Neurologic:  Alert and  oriented x4;  grossly normal neurologically. Skin:  Intact without significant lesions or rashes. Psych:  Alert and cooperative. Normal mood and affect.   Assessment   Jillian Shepherd is a 37 y.o. female presenting today in follow-up with a history of  ETOH abuse (none since 74/0814) complicated by recurrent alcoholic pancreatitis with necrosis and pseudocysts, non-occlusive thrombus of portal vein/splenic vein thrombosis/splenic hematoma but not candidate for anticoagulation due to hematoma. She is on TPN currently, which was started during last hospitalization.   Complicated pancreatitis: clinically improved since discharge and has been on TPN since end of Dec 2023. She is attempting to eat clear liquids as  tolerated and will take Creon when she does have oral intake. Overall, this is minimal. Abdominal pain much improved. Agree with referral to Pain management per PCP.   We will see her in close follow-up in 6-8 weeks. Appears she has an upcoming appt 10/11/22 with Dr. Jiles Crocker at [facility]; I have asked our office to confirm this. She is to call with any acute on chronic pain and will do stat imaging. Goal is to avoid recurrent hospitalizations.     PLAN    Continue TPN Continue Creon with oral intake Call if any worsening pain Continue Fentanyl patch and oxycodone; agree with Pain management referral 6-8 week follow-up Appt upcoming with [facility] 3/7: we are verifying this   Annitta Needs, PhD, ANP-BC [facility] Gastroenterology

## 2022-09-05 ENCOUNTER — Encounter: Payer: Self-pay | Admitting: Family Medicine

## 2022-09-06 ENCOUNTER — Other Ambulatory Visit: Payer: Self-pay

## 2022-09-06 ENCOUNTER — Encounter: Payer: Self-pay | Admitting: Family Medicine

## 2022-09-06 ENCOUNTER — Other Ambulatory Visit: Payer: Self-pay | Admitting: Family Medicine

## 2022-09-06 MED ORDER — OXYCODONE HCL 10 MG PO TABS: 10.0000 mg | ORAL_TABLET | Freq: Four times a day (QID) | ORAL | 0 refills | Status: DC | PRN

## 2022-09-13 ENCOUNTER — Other Ambulatory Visit: Payer: Self-pay

## 2022-09-13 ENCOUNTER — Other Ambulatory Visit: Payer: Self-pay | Admitting: Family Medicine

## 2022-09-13 ENCOUNTER — Encounter: Payer: Self-pay | Admitting: Family Medicine

## 2022-09-13 MED ORDER — OXYCODONE HCL 10 MG PO TABS: 10.0000 mg | ORAL_TABLET | Freq: Four times a day (QID) | ORAL | 0 refills | Status: DC | PRN

## 2022-09-19 ENCOUNTER — Telehealth: Payer: Self-pay

## 2022-09-19 ENCOUNTER — Encounter: Payer: Self-pay | Admitting: Family Medicine

## 2022-09-19 NOTE — Telephone Encounter (Signed)
Pt called requesting a refill on her Oxycodone and Zofran. Last RF on Oxycodone was 09/13/2022 and for her Zofran was 08/30/2022. Thank you.

## 2022-09-20 ENCOUNTER — Encounter: Payer: Self-pay | Admitting: Family Medicine

## 2022-09-20 ENCOUNTER — Other Ambulatory Visit: Payer: Self-pay | Admitting: Family Medicine

## 2022-09-20 MED ORDER — OXYCODONE HCL 10 MG PO TABS: 10.0000 mg | ORAL_TABLET | Freq: Four times a day (QID) | ORAL | 0 refills | Status: DC | PRN

## 2022-09-20 NOTE — Telephone Encounter (Signed)
Requested medications are due for refill today.  Provider to determine  Requested medications are on the active medications list.  yes  Last refill. 08/30/2022 #30 0 rf  Future visit scheduled.   no  Notes to clinic.  Refill not delegated.    Requested Prescriptions  Pending Prescriptions Disp Refills   ondansetron (ZOFRAN) 4 MG tablet [Pharmacy Med Name: ONDANSETRON HCL 4 MG TABLET] 30 tablet 0    Sig: Take 1 tablet (4 mg total) by mouth every 6 (six) hours as needed for nausea or vomiting.     Not Delegated - Gastroenterology: Antiemetics - ondansetron Failed - 09/20/2022  5:07 PM      Failed - This refill cannot be delegated      Failed - Valid encounter within last 6 months    Recent Outpatient Visits           1 year ago Hypokalemia   [place] Dennard Schaumann, Cammie Mcgee, MD   1 year ago Other secondary hypertension   [place] Pickard, Cammie Mcgee, MD   2 years ago Epigastric pain   [place] Dennard Schaumann, Cammie Mcgee, MD   2 years ago Epigastric pain   [place] [name], MD   3 years ago Right forearm pain   [place], [place] Nunnery, MD              Passed - AST in normal range and within 360 days    AST  Date Value Ref Range Status  08/23/2022 21 15 - 41 U/L Final         Passed - ALT in normal range and within 360 days    ALT  Date Value Ref Range Status  08/23/2022 12 0 - 44 U/L Final

## 2022-09-21 ENCOUNTER — Other Ambulatory Visit: Payer: Self-pay | Admitting: Family Medicine

## 2022-09-21 MED ORDER — ONDANSETRON HCL 4 MG PO TABS: 4.0000 mg | ORAL_TABLET | Freq: Four times a day (QID) | ORAL | 0 refills | Status: AC | PRN

## 2022-09-26 ENCOUNTER — Encounter: Payer: Self-pay | Admitting: Family Medicine

## 2022-09-27 ENCOUNTER — Other Ambulatory Visit: Payer: Self-pay | Admitting: Family Medicine

## 2022-09-27 MED ORDER — OXYCODONE HCL 10 MG PO TABS: 10.0000 mg | ORAL_TABLET | Freq: Four times a day (QID) | ORAL | 0 refills | Status: DC | PRN

## 2022-10-01 ENCOUNTER — Telehealth: Payer: Self-pay | Admitting: Family Medicine

## 2022-10-01 NOTE — Telephone Encounter (Signed)
Patient requesting call back from nurse regarding pain management.   Please advise at [phone].

## 2022-10-04 ENCOUNTER — Encounter: Payer: Self-pay | Admitting: Family Medicine

## 2022-10-04 ENCOUNTER — Other Ambulatory Visit: Payer: Self-pay | Admitting: Family Medicine

## 2022-10-04 MED ORDER — OXYCODONE HCL 10 MG PO TABS: 10.0000 mg | ORAL_TABLET | Freq: Four times a day (QID) | ORAL | 0 refills | Status: DC | PRN

## 2022-10-04 NOTE — Telephone Encounter (Signed)
Pt has called back again, checking on status of letter and medication. Thank you.  10/01/2022: Spoke with patient and she states the pain clinic advised it would be 3 weeks before she can get in to see them and that they will call her as soon as they can get her in.  Pt also states she has applied for disability and food stamps and needs a letter stating that she is currently not working faxed to [employer] at [phone].  Pt states she has met her deductible, so a 30 day supply of her pain medication, so she will be able to get her medications for free. Thank you.

## 2022-10-11 ENCOUNTER — Encounter: Payer: Self-pay | Admitting: Family Medicine

## 2022-10-12 ENCOUNTER — Other Ambulatory Visit: Payer: Self-pay | Admitting: Family Medicine

## 2022-10-12 MED ORDER — BUPROPION HCL ER (XL) 150 MG PO TB24: 150.0000 mg | ORAL_TABLET | Freq: Every day | ORAL | 5 refills | Status: DC

## 2022-10-15 ENCOUNTER — Encounter: Payer: Self-pay | Admitting: Family Medicine

## 2022-10-16 ENCOUNTER — Other Ambulatory Visit: Payer: Self-pay | Admitting: Family Medicine

## 2022-10-16 MED ORDER — FENTANYL 25 MCG/HR TD PT72: 1.0000 | MEDICATED_PATCH | TRANSDERMAL | 0 refills | Status: DC

## 2022-10-30 ENCOUNTER — Ambulatory Visit: Payer: 59 | Admitting: Gastroenterology

## 2022-10-30 NOTE — Progress Notes (Deleted)
Referring Provider: [name], MD Primary Care Physician:  [name], MD Primary GI Physician: Dr. Rayne Du chief complaint on file.   HPI:   Jillian Shepherd is a 37 y.o. 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  Last hospitalized in January 2024 for recurrent alcoholic pancreatitis.  She was started on TPN at that time.  She is presenting today for***  Last seen in our office 09/04/2022.  She had been on TPN little over a month and reported her abdominal pain waxed and waned, but overall improved.  Nausea frequently using Zofran every 6 hours as needed.  She had fentanyl patches also oxycodone every 6 hours.  She was taking Creon 72,000 units if eating something.  She had upcoming appointment with [facility] on 3/7.  Recommended continuing TPN, Creon, fentanyl patch and oxycodone, see pain management, keep follow-up with [facility], office visit with [place] in 6-8.  She saw Dr. Jiles Crocker with [facility] 10/11/2022.  Plan included MRI/MRCP to follow-up on cirrhosis/liver complications, continue PERT, TPN, start Ensure, start amitriptyline 50 mg nightly, consider enteral feeds if unable to take adequate calories by mouth, follow-up via video visit in 2 months.  Per telephone note, she was to be scheduled for an MRI/MRCP on 3/26.   Today:    Past Medical History:  Diagnosis Date   Abdominal pain    Alcohol abuse    B12 deficiency    Cancer ([place])    squmaous cell skin cancer    Collagen vascular disease (HCC)    Hypokalemia    Liddle's syndrome    Obesity (BMI 35.0-39.9 without comorbidity)    Pancreatitis    Pleural effusion    Splenic hemorrhage     Past Surgical History:  Procedure Laterality Date    ADENOIDECTOMY     CHEST TUBE INSERTION     COLONOSCOPY WITH PROPOFOL N/A 10/06/2020   Procedure: COLONOSCOPY WITH PROPOFOL;  Surgeon: Lin Landsman, MD;  Location: [facility] ENDOSCOPY;  Service: Gastroenterology;  Laterality: N/A;  COVID POSITIVE 08/23/2020   ESOPHAGOGASTRODUODENOSCOPY (EGD) WITH PROPOFOL N/A 10/06/2020   Procedure: ESOPHAGOGASTRODUODENOSCOPY (EGD) WITH PROPOFOL;  Surgeon: Lin Landsman, MD;  Location: [place];  Service: Gastroenterology;  Laterality: N/A;   skin cancer removal Left    leg   TONSILLECTOMY      Current Outpatient Medications  Medication Sig Dispense Refill   buPROPion (WELLBUTRIN XL) 150 MG 24 hr tablet Take 1 tablet (150 mg total) by mouth daily. 30 tablet 5   amLODipine (NORVASC) 10 MG tablet Take 1 tablet (10 mg total) by mouth daily. (Patient taking differently: Take 10 mg by mouth in the morning.) 90 tablet 3   bisacodyl (DULCOLAX) 10 MG suppository Place 1 suppository (10 mg total) rectally daily as needed for moderate constipation. 12 suppository 0   cyanocobalamin (VITAMIN B12) 1000 MCG/ML injection Inject 1 mL (1,000 mcg total) into the muscle every 30 (thirty) days. 1 mL 11   fentaNYL (DURAGESIC) 25 MCG/HR Place 1 patch onto the skin every 3 (three) days. 10 patch 0   folic acid (FOLVITE) 1 MG tablet Take 1 tablet (1 mg total) by mouth daily. (Patient taking differently: Take 1 mg by mouth in the morning.) 30 tablet 11   hydrOXYzine (VISTARIL) 25 MG capsule Take  1 capsule (25 mg total) by mouth every 8 (eight) hours as needed (for sleep). 30 capsule 0   KLOR-CON M20 20 MEQ tablet Take by mouth.     lipase/protease/amylase (CREON) 36000 UNITS CPEP capsule Take 2 capsules (72,000 Units total) by mouth 3 (three) times daily with meals. May also take 1 capsule (36,000 Units total) as needed (with snacks). 240 capsule 11   metFORMIN (GLUCOPHAGE) 500 MG tablet Take 500 mg by mouth 2 (two) times daily.     Oxycodone HCl 10 MG TABS Take 1 tablet  (10 mg total) by mouth every 6 (six) hours as needed (breakthrough pain only). 120 tablet 0   pantoprazole (PROTONIX) 40 MG tablet Take 1 tablet (40 mg total) by mouth daily. 30 tablet 0   spironolactone (ALDACTONE) 25 MG tablet Take 25 mg by mouth 2 (two) times daily.     valACYclovir (VALTREX) 1000 MG tablet Take 1 tablet (1,000 mg total) by mouth 3 (three) times daily. 21 tablet 0   No current facility-administered medications for this visit.    Allergies as of 11/01/2022   (No Known Allergies)    Family History  Problem Relation Age of Onset   Hypertension Father    Anxiety disorder Father    Depression Father    Melanoma Paternal Grandmother        of skin   Diabetes Paternal Grandmother    Cirrhosis Paternal Uncle     Social History   Socioeconomic History   Marital status: Married    Spouse name: Not on file   Number of children: Not on file   Years of education: Not on file   Highest education level: Not on file  Occupational History   Not on file  Tobacco Use   Smoking status: Every Day    Packs/day: 0.25    Years: 9.00    Additional pack years: 0.00    Total pack years: 2.25    Types: Cigarettes    Passive exposure: Current   Smokeless tobacco: Never  Vaping Use   Vaping Use: Never used  Substance and Sexual Activity   Alcohol use: Yes    Alcohol/week: 2.0 standard drinks of alcohol    Types: 2 Cans of beer per week   Drug use: No   Sexual activity: Yes    Birth control/protection: None  Other Topics Concern   Not on file  Social History Narrative   ** Merged History Encounter **       Social Determinants of Health   Financial Resource Strain: Not on file  Food Insecurity: No Food Insecurity (08/15/2022)   Hunger Vital Sign    Worried About Running Out of Food in the Last Year: Never true    Ran Out of Food in the Last Year: Never true  Transportation Needs: No Transportation Needs (08/15/2022)   PRAPARE - Radiographer, therapeutic (Medical): No    Lack of Transportation (Non-Medical): No  Physical Activity: Not on file  Stress: Not on file  Social Connections: Not on file    Review of Systems: Gen: Denies fever, chills, anorexia. Denies fatigue, weakness, weight loss.  CV: Denies chest pain, palpitations, syncope, peripheral edema, and claudication. Resp: Denies dyspnea at rest, cough, wheezing, coughing up blood, and pleurisy. GI: Denies vomiting blood, jaundice, and fecal incontinence.   Denies dysphagia or odynophagia. Derm: Denies rash, itching, dry skin Psych: Denies depression, anxiety, memory loss, confusion. No homicidal or suicidal ideation.  Heme:  Denies bruising, bleeding, and enlarged lymph nodes.  Physical Exam: There were no vitals taken for this visit. General:   Alert and oriented. No distress noted. Pleasant and cooperative.  Head:  Normocephalic and atraumatic. Eyes:  Conjuctiva clear without scleral icterus. Heart:  S1, S2 present without murmurs appreciated. Lungs:  Clear to auscultation bilaterally. No wheezes, rales, or rhonchi. No distress.  Abdomen:  +BS, soft, non-tender and non-distended. No rebound or guarding. No HSM or masses noted. Msk:  Symmetrical without gross deformities. Normal posture. Extremities:  Without edema. Neurologic:  Alert and  oriented x4 Psych:  Normal mood and affect.    Assessment:     Plan:  ***   Aliene Altes, PA-C [facility]astroenterology 11/01/2022

## 2022-11-01 ENCOUNTER — Ambulatory Visit: Payer: 59 | Admitting: Gastroenterology

## 2022-11-03 ENCOUNTER — Other Ambulatory Visit: Payer: Self-pay | Admitting: Family Medicine

## 2022-11-05 NOTE — Progress Notes (Deleted)
Referring Provider: [name], MD Primary Care Physician:  [name], MD Primary GI Physician: Dr. Gala Romney ***  No chief complaint on file.   HPI:   Jillian Shepherd is a 37 y.o. 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  Last hospitalized in January 2024 for recurrent alcoholic pancreatitis.  She was started on TPN at that time.  She is presenting today for***   Last seen in our office 09/04/2022.  She had been on TPN little over a month and reported her abdominal pain waxed and waned, but overall improved.  Nausea frequently using Zofran every 6 hours as needed.  She had fentanyl patches also oxycodone every 6 hours.  She was taking Creon 72,000 units if eating something.  She had upcoming appointment with [facility] on 3/7.  Recommended continuing TPN, Creon, fentanyl patch and oxycodone, see pain management, keep follow-up with [facility], office visit with [place] in 6-8.   She saw Dr. Jiles Crocker with [facility] 10/11/2022.  Plan included MRI/MRCP to follow-up on cirrhosis/liver complications, continue PERT, TPN, start Ensure, start amitriptyline 50 mg nightly, consider enteral feeds if unable to take adequate calories by mouth, follow-up via video visit in 2 months.  MRI/MRCP with and without contrast 10/30/2022: Pancreatic tail pseudocyst measuring up to 2.9 cm, large splenic fluid collection/hematoma measuring up to 10.9 cm, mild amatory changes of the pancreatic tail may represent ongoing pancreatitis, suspected thrombi within the intrahepatic portal veins, consider ultrasound for further evaluation, arterially enhancing small lesion of the hepatic dome without washout, suspect flash filling hemangioma in the absence of cirrhotic  liver changes.  Attention to follow-up imaging recommended.  Today:    Past Medical History:  Diagnosis Date   Abdominal pain    Alcohol abuse    B12 deficiency    Cancer ([place])    squmaous cell skin cancer    Collagen vascular disease (HCC)    Hypokalemia    Liddle's syndrome    Obesity (BMI 35.0-39.9 without comorbidity)    Pancreatitis    Pleural effusion    Splenic hemorrhage     Past Surgical History:  Procedure Laterality Date   ADENOIDECTOMY     CHEST TUBE INSERTION     COLONOSCOPY WITH PROPOFOL N/A 10/06/2020   Procedure: COLONOSCOPY WITH PROPOFOL;  Surgeon: Lin Landsman, MD;  Location: [facility] ENDOSCOPY;  Service: Gastroenterology;  Laterality: N/A;  COVID POSITIVE 08/23/2020   ESOPHAGOGASTRODUODENOSCOPY (EGD) WITH PROPOFOL N/A 10/06/2020   Procedure: ESOPHAGOGASTRODUODENOSCOPY (EGD) WITH PROPOFOL;  Surgeon: Lin Landsman, MD;  Location: [place];  Service: Gastroenterology;  Laterality: N/A;   skin cancer removal Left    leg   TONSILLECTOMY      Current Outpatient Medications  Medication Sig Dispense Refill   buPROPion (WELLBUTRIN XL) 150 MG 24 hr tablet Take 1 tablet (150 mg total) by mouth daily. 30 tablet 5   amLODipine (NORVASC) 10 MG tablet Take 1 tablet (10 mg total) by mouth daily. (Patient taking differently: Take 10 mg by mouth in the morning.) 90 tablet 3   bisacodyl (DULCOLAX) 10 MG suppository Place 1 suppository (10 mg total) rectally daily as needed for moderate constipation. 12 suppository 0   cyanocobalamin (VITAMIN B12) 1000 MCG/ML injection Inject 1 mL (1,000 mcg total) into the muscle every 30 (thirty)  days. 1 mL 11   fentaNYL (DURAGESIC) 25 MCG/HR Place 1 patch onto the skin every 3 (three) days. 10 patch 0   folic acid (FOLVITE) 1 MG tablet Take 1 tablet (1 mg total) by mouth daily. (Patient taking differently: Take 1 mg by mouth in the morning.) 30 tablet 11   hydrOXYzine (VISTARIL) 25 MG capsule Take 1 capsule (25 mg total) by  mouth every 8 (eight) hours as needed (for sleep). 30 capsule 0   KLOR-CON M20 20 MEQ tablet Take by mouth.     lipase/protease/amylase (CREON) 36000 UNITS CPEP capsule Take 2 capsules (72,000 Units total) by mouth 3 (three) times daily with meals. May also take 1 capsule (36,000 Units total) as needed (with snacks). 240 capsule 11   metFORMIN (GLUCOPHAGE) 500 MG tablet Take 500 mg by mouth 2 (two) times daily.     Oxycodone HCl 10 MG TABS Take 1 tablet (10 mg total) by mouth every 6 (six) hours as needed (breakthrough pain only). 120 tablet 0   pantoprazole (PROTONIX) 40 MG tablet Take 1 tablet (40 mg total) by mouth daily. 30 tablet 0   spironolactone (ALDACTONE) 25 MG tablet Take 25 mg by mouth 2 (two) times daily.     valACYclovir (VALTREX) 1000 MG tablet Take 1 tablet (1,000 mg total) by mouth 3 (three) times daily. 21 tablet 0   No current facility-administered medications for this visit.    Allergies as of 11/07/2022   (No Known Allergies)    Family History  Problem Relation Age of Onset   Hypertension Father    Anxiety disorder Father    Depression Father    Melanoma Paternal Grandmother        of skin   Diabetes Paternal Grandmother    Cirrhosis Paternal Uncle     Social History   Socioeconomic History   Marital status: Married    Spouse name: Not on file   Number of children: Not on file   Years of education: Not on file   Highest education level: Not on file  Occupational History   Not on file  Tobacco Use   Smoking status: Every Day    Packs/day: 0.25    Years: 9.00    Additional pack years: 0.00    Total pack years: 2.25    Types: Cigarettes    Passive exposure: Current   Smokeless tobacco: Never  Vaping Use   Vaping Use: Never used  Substance and Sexual Activity   Alcohol use: Yes    Alcohol/week: 2.0 standard drinks of alcohol    Types: 2 Cans of beer per week   Drug use: No   Sexual activity: Yes    Birth control/protection: None  Other Topics  Concern   Not on file  Social History Narrative   ** Merged History Encounter **       Social Determinants of Health   Financial Resource Strain: Not on file  Food Insecurity: No Food Insecurity (08/15/2022)   Hunger Vital Sign    Worried About Running Out of Food in the Last Year: Never true    Ran Out of Food in the Last Year: Never true  Transportation Needs: No Transportation Needs (08/15/2022)   PRAPARE - Hydrologist (Medical): No    Lack of Transportation (Non-Medical): No  Physical Activity: Not on file  Stress: Not on file  Social Connections: Not on file    Review of Systems: Gen: Denies fever, chills, anorexia.  Denies fatigue, weakness, weight loss.  CV: Denies chest pain, palpitations, syncope, peripheral edema, and claudication. Resp: Denies dyspnea at rest, cough, wheezing, coughing up blood, and pleurisy. GI: Denies vomiting blood, jaundice, and fecal incontinence.   Denies dysphagia or odynophagia. Derm: Denies rash, itching, dry skin Psych: Denies depression, anxiety, memory loss, confusion. No homicidal or suicidal ideation.  Heme: Denies bruising, bleeding, and enlarged lymph nodes.  Physical Exam: There were no vitals taken for this visit. General:   Alert and oriented. No distress noted. Pleasant and cooperative.  Head:  Normocephalic and atraumatic. Eyes:  Conjuctiva clear without scleral icterus. Heart:  S1, S2 present without murmurs appreciated. Lungs:  Clear to auscultation bilaterally. No wheezes, rales, or rhonchi. No distress.  Abdomen:  +BS, soft, non-tender and non-distended. No rebound or guarding. No HSM or masses noted. Msk:  Symmetrical without gross deformities. Normal posture. Extremities:  Without edema. Neurologic:  Alert and  oriented x4 Psych:  Normal mood and affect.    Assessment:     Plan:  ***   Aliene Altes, PA-C [facility]enter[facility] 11/07/2022

## 2022-11-05 NOTE — Telephone Encounter (Signed)
Requested Prescriptions  Refused Prescriptions Disp Refills   buPROPion (WELLBUTRIN XL) 150 MG 24 hr tablet [Pharmacy Med Name: BUPROPION HCL XL 150 MG TABLET] 90 tablet 2    Sig: TAKE 1 TABLET BY MOUTH EVERY DAY     Psychiatry: Antidepressants - bupropion Failed - 11/03/2022  8:33 AM      Failed - Cr in normal range and within 360 days    Creat  Date Value Ref Range Status  06/21/2022 0.44 (L) 0.50 - 0.97 mg/dL Final   Creatinine, Ser  Date Value Ref Range Status  08/23/2022 0.43 (L) 0.44 - 1.00 mg/dL Final   Creatinine, Urine  Date Value Ref Range Status  01/19/2015 206 mg/dL Final         Failed - Valid encounter within last 6 months    Recent Outpatient Visits           2 years ago Hypokalemia   [place] [name], MD   2 years ago Other secondary hypertension   [place] [name], MD   2 years ago Epigastric pain   [place] [name], MD   2 years ago Epigastric pain   [place] Dennard Schaumann, Cammie Mcgee, MD   3 years ago Right forearm pain   [place], [place] Nunnery, MD              Passed - AST in normal range and within 360 days    AST  Date Value Ref Range Status  08/23/2022 21 15 - 41 U/L Final         Passed - ALT in normal range and within 360 days    ALT  Date Value Ref Range Status  08/23/2022 12 0 - 44 U/L Final         Passed - Completed PHQ-2 or PHQ-9 in the last 360 days      Passed - Last BP in normal range    BP Readings from Last 1 Encounters:  09/04/22 109/74

## 2022-11-06 ENCOUNTER — Other Ambulatory Visit: Payer: Self-pay | Admitting: Family Medicine

## 2022-11-06 MED ORDER — OXYCODONE HCL 10 MG PO TABS: 10.0000 mg | ORAL_TABLET | Freq: Four times a day (QID) | ORAL | 0 refills | Status: DC | PRN

## 2022-11-06 MED ORDER — BUPROPION HCL ER (XL) 150 MG PO TB24: 150.0000 mg | ORAL_TABLET | Freq: Every day | ORAL | 5 refills | Status: DC

## 2022-11-07 ENCOUNTER — Ambulatory Visit (INDEPENDENT_AMBULATORY_CARE_PROVIDER_SITE_OTHER): Payer: 59 | Admitting: Family Medicine

## 2022-11-07 ENCOUNTER — Other Ambulatory Visit: Payer: Self-pay

## 2022-11-07 ENCOUNTER — Ambulatory Visit: Payer: 59 | Admitting: Gastroenterology

## 2022-11-07 ENCOUNTER — Encounter (HOSPITAL_COMMUNITY): Payer: Self-pay | Admitting: Emergency Medicine

## 2022-11-07 ENCOUNTER — Emergency Department (HOSPITAL_COMMUNITY)
Admission: EM | Admit: 2022-11-07 | Discharge: 2022-11-07 | Disposition: A | Discharge status: Home / Self Care | Payer: 59 | Attending: Emergency Medicine | Admitting: Emergency Medicine

## 2022-11-07 VITALS — BP 110/84 | HR 105 | Temp 98.1°F | Ht 63.0 in | Wt 153.0 lb

## 2022-11-07 DIAGNOSIS — Z79899 Other long term (current) drug therapy: Secondary | ICD-10-CM | POA: Insufficient documentation

## 2022-11-07 DIAGNOSIS — T80219A Unspecified infection due to central venous catheter, initial encounter: Secondary | ICD-10-CM | POA: Insufficient documentation

## 2022-11-07 DIAGNOSIS — I10 Essential (primary) hypertension: Secondary | ICD-10-CM | POA: Insufficient documentation

## 2022-11-07 DIAGNOSIS — L03113 Cellulitis of right upper limb: Secondary | ICD-10-CM

## 2022-11-07 DIAGNOSIS — M7989 Other specified soft tissue disorders: Secondary | ICD-10-CM | POA: Diagnosis present

## 2022-11-07 LAB — COMPREHENSIVE METABOLIC PANEL
ALT: 11 U/L (ref 0–44)
AST: 16 U/L (ref 15–41)
Albumin: 3.6 g/dL (ref 3.5–5.0)
Alkaline Phosphatase: 83 U/L (ref 38–126)
Anion gap: 6 (ref 5–15)
BUN: 8 mg/dL (ref 6–20)
CO2: 25 mmol/L (ref 22–32)
Calcium: 8.6 mg/dL — ABNORMAL LOW (ref 8.9–10.3)
Chloride: 101 mmol/L (ref 98–111)
Creatinine, Ser: 0.52 mg/dL (ref 0.44–1.00)
GFR, Estimated: >60 mL/min (ref >60)
Glucose, Bld: 96 mg/dL (ref 70–99)
Potassium: 3.9 mmol/L (ref 3.5–5.1)
Sodium: 132 mmol/L — ABNORMAL LOW (ref 135–145)
Total Bilirubin: 0.3 mg/dL (ref 0.3–1.2)
Total Protein: 7.6 g/dL (ref 6.5–8.1)

## 2022-11-07 LAB — CBC WITH DIFFERENTIAL/PLATELET
Abs Immature Granulocytes: 0.02 10*3/uL (ref 0.00–0.07)
Basophils Absolute: 0 10*3/uL (ref 0.0–0.1)
Basophils Relative: 0 %
Eosinophils Absolute: 0.2 10*3/uL (ref 0.0–0.5)
Eosinophils Relative: 3 %
HCT: 43.2 % (ref 36.0–46.0)
Hemoglobin: 13.9 g/dL (ref 12.0–15.0)
Immature Granulocytes: 0 %
Lymphocytes Relative: 38 %
Lymphs Abs: 3.4 10*3/uL (ref 0.7–4.0)
MCH: 27.1 pg (ref 26.0–34.0)
MCHC: 32.2 g/dL (ref 30.0–36.0)
MCV: 84.2 fL (ref 80.0–100.0)
Monocytes Absolute: 0.4 10*3/uL (ref 0.1–1.0)
Monocytes Relative: 4 %
Neutro Abs: 4.9 10*3/uL (ref 1.7–7.7)
Neutrophils Relative %: 55 %
Platelets: 312 10*3/uL (ref 150–400)
RBC: 5.13 MIL/uL — ABNORMAL HIGH (ref 3.87–5.11)
RDW: 18.7 % — ABNORMAL HIGH (ref 11.5–15.5)
WBC: 8.9 10*3/uL (ref 4.0–10.5)
nRBC: 0 % (ref 0.0–0.2)

## 2022-11-07 LAB — URINALYSIS, ROUTINE W REFLEX MICROSCOPIC
Bilirubin Urine: NEGATIVE
Glucose, UA: NEGATIVE mg/dL
Hgb urine dipstick: NEGATIVE
Ketones, ur: NEGATIVE mg/dL
Leukocytes,Ua: NEGATIVE
Nitrite: NEGATIVE
Protein, ur: NEGATIVE mg/dL
Specific Gravity, Urine: 1.008 (ref 1.005–1.030)
pH: 6 (ref 5.0–8.0)

## 2022-11-07 LAB — POC URINE PREG, ED: Preg Test, Ur: NEGATIVE

## 2022-11-07 LAB — LACTIC ACID, PLASMA: Lactic Acid, Venous: 1.1 mmol/L (ref 0.5–1.9)

## 2022-11-07 MED ORDER — CEFADROXIL 500 MG PO CAPS: 500.0000 mg | ORAL_CAPSULE | Freq: Two times a day (BID) | ORAL | 0 refills | Status: DC

## 2022-11-07 MED ORDER — DOXYCYCLINE HYCLATE 100 MG PO CAPS: 100.0000 mg | ORAL_CAPSULE | Freq: Two times a day (BID) | ORAL | 0 refills | Status: DC

## 2022-11-07 MED ORDER — BACITRACIN ZINC 500 UNIT/GM EX OINT: 1.0000 | TOPICAL_OINTMENT | Freq: Two times a day (BID) | CUTANEOUS | 0 refills | Status: DC | PRN

## 2022-11-07 NOTE — ED Notes (Signed)
ED Provider at bedside. 

## 2022-11-07 NOTE — ED Triage Notes (Signed)
Pt has picc  to the right upper arm that is red since last week. Pt has picc for tpn.

## 2022-11-07 NOTE — ED Provider Notes (Incomplete)
[place] EMERGENCY DEPARTMENT AT [facility] Provider Note   CSN: [id number] Arrival date & time: 11/07/22  1122     History Chief Complaint  Patient presents with   Vascular Access Problem    Jillian Shepherd is a 37 y.o. female with history of alcohol use disorder in remission, hypertension, alcohol induced pancreatitis presents the emergency room today for evaluation of possible PICC line infection.  The patient reports that she has a PICC line in her arm and she usually receives TPN but has not in the past 2 to 3 weeks that she has been able to tolerate p.o.  She reports that she had a bandage changed on Monday and afterwards started to have the irritation, redness, itching and symptoms like the discharge.  The patient says she is pretty sure this is the sam bandage that she always gets. She denies any fevers.  She has not use the PICC line given that she has been able to tolerate p.o.  She denies any numbness, tingling, or swelling to the arm.  She denies any chest pain or shortness of breath.  She was seen at her family medicine practice and sent over for evaluation.  HPI     Home Medications Prior to Admission medications   Medication Sig Start Date End Date Taking? Authorizing Provider  amLODipine (NORVASC) 10 MG tablet Take 1 tablet (10 mg total) by mouth daily. Patient taking differently: Take 10 mg by mouth in the morning. 04/10/22   Park Meo, FNP  bisacodyl (DULCOLAX) 10 MG suppository Place 1 suppository (10 mg total) rectally daily as needed for moderate constipation. 08/23/22   Johnson, Clanford L, MD  buPROPion (WELLBUTRIN XL) 150 MG 24 hr tablet Take 1 tablet (150 mg total) by mouth daily. 11/06/22   [name], MD  cyanocobalamin (VITAMIN B12) 1000 MCG/ML injection Inject 1 mL (1,000 mcg total) into the muscle every 30 (thirty) days. 05/17/22   [name], MD  fentaNYL (DURAGESIC) 25 MCG/HR Place 1 patch onto the skin every 3 (three) days.  10/16/22   [name], MD  folic acid (FOLVITE) 1 MG tablet Take 1 tablet (1 mg total) by mouth daily. Patient taking differently: Take 1 mg by mouth in the morning. 05/17/22 05/12/23  [name], MD  hydrOXYzine (VISTARIL) 25 MG capsule Take 1 capsule (25 mg total) by mouth every 8 (eight) hours as needed (for sleep). 08/30/22   [name], MD  KLOR-CON M20 20 MEQ tablet Take by mouth. 09/08/22   [provider]  lipase/protease/amylase (CREON) 36000 UNITS CPEP capsule Take 2 capsules (72,000 Units total) by mouth 3 (three) times daily with meals. May also take 1 capsule (36,000 Units total) as needed (with snacks). 07/20/22   Park Meo, FNP  metFORMIN (GLUCOPHAGE) 500 MG tablet Take 500 mg by mouth 2 (two) times daily. 09/07/22   [provider]  Oxycodone HCl 10 MG TABS Take 1 tablet (10 mg total) by mouth every 6 (six) hours as needed (breakthrough pain only). 11/06/22   [name], MD  pantoprazole (PROTONIX) 40 MG tablet Take 1 tablet (40 mg total) by mouth daily. 07/13/22 09/04/22  Kendell Bane, MD  spironolactone (ALDACTONE) 25 MG tablet Take 25 mg by mouth 2 (two) times daily. 08/10/22   [provider]  valACYclovir (VALTREX) 1000 MG tablet Take 1 tablet (1,000 mg total) by mouth 3 (three) times daily. 08/30/22   [name], MD  Allergies    Patient has no known allergies.    Review of Systems   Review of Systems  Constitutional:  Negative for chills and fever.  Respiratory:  Negative for shortness of breath.   Cardiovascular:  Negative for chest pain.  Skin:  Positive for rash and wound.    Physical Exam Updated Vital Signs BP (!) 137/97 (BP Location: Left Arm)   Pulse (!) 106   Temp 98.1 F (36.7 C) (Oral)   Resp 16   Ht 5\' 3"  (1.6 m)   Wt 68.9 kg   LMP 10/24/2022   SpO2 97%   BMI 26.93 kg/m  Physical Exam Vitals and nursing note reviewed.  Constitutional:      General: She is not in acute distress.     Appearance: Normal appearance. She is not ill-appearing or toxic-appearing.  Eyes:     General: No scleral icterus. Cardiovascular:     Rate and Rhythm: Normal rate.  Pulmonary:     Effort: Pulmonary effort is normal. No respiratory distress.     Breath sounds: Normal breath sounds.  Musculoskeletal:     Comments: RUE - No swelling noted. Erythema and xerosis in the shape of the overlying bandage. There is some yellow/cream discharge noted under the bandage. No red streaking. No fluctuance or induration. No crepitus. No skin sloughing or blistering noted.  Compartments are soft. Palpable radial pulses. Cap refill brisk.   Skin:    General: Skin is dry.  Neurological:     General: No focal deficit present.     Mental Status: She is alert. Mental status is at baseline.  Psychiatric:        Mood and Affect: Mood normal.          ED Results / Procedures / Treatments   Labs (all labs ordered are listed, but only abnormal results are displayed) Labs Reviewed  COMPREHENSIVE METABOLIC PANEL - Abnormal; Notable for the following components:      Result Value   Sodium 132 (*)    Calcium 8.6 (*)    All other components within normal limits  CBC WITH DIFFERENTIAL/PLATELET - Abnormal; Notable for the following components:   RBC 5.13 (*)    RDW 18.7 (*)    All other components within normal limits  CULTURE, BLOOD (SINGLE)  CULTURE, BLOOD (SINGLE)  LACTIC ACID, PLASMA  URINALYSIS, ROUTINE W REFLEX MICROSCOPIC  POC URINE PREG, ED    EKG None  Radiology No results found.  Procedures Procedures   Medications Ordered in ED Medications - No data to display  ED Course/ Medical Decision Making/ A&P    Medical Decision Making Amount and/or Complexity of Data Reviewed Labs: ordered.  Risk OTC drugs. Prescription drug management.   37 y.o. female presents to the ER for evaluation of possible PICC line infection. Differential diagnosis includes but is not limited to  cellulitis, allergic dermatitis, contact dermatitis, PICC line infection, deep space infection, sepsis. Vital signs unremarkable. Physical exam as noted above.   I independently reviewed and interpreted the patient's labs.  Urinalysis is unremarkable.  Pregnancy test is negative.  Lactic acid is within normal limits.  CBC without leukocytosis or anemia.  CMP did show mildly decreased sodium 132 with a mild decreased calcium 8.6.  Otherwise no electrolyte or LFT abnormality.  Culture pending of the blood and also culture pending from the PICC line itself.  I discussed this case with my attending.  He recommends removing the PICC line and placing a bandage.  Recommends antibiotics as well.  Does not think any admission or any other orders are needed given the patient's workup not suggesting sepsis or any systemic signs.  She has no red streaking, fevers, normal white count, negative lactic acid.  The [facility] was able to remove the PICC line and bandage was placed.  I discussed antibiotic coverage with our pharmacist, Christiane Ha.  We discussed doing doxycycline as well as Duricef for broader cartridge.  Will also provide for some bacitracin ointment to help with the surrounding skin area and irritation.  We discussed the results of the labs/imaging. The plan is to take the antibiotics as prescribed.  Wound care.  Follow-up with PCP. We discussed strict return precautions and red flag symptoms. The patient verbalized their understanding and agrees to the plan. The patient is stable and being discharged home in good condition.  I discussed this case with my attending physician who cosigned this note including patient's presenting symptoms, physical exam, and planned diagnostics and interventions. Attending physician stated agreement with plan or made changes to plan which were implemented.   Portions of this report may have been transcribed using voice recognition software. Every effort was made to ensure accuracy;  however, inadvertent computerized transcription errors may be present.    Final Clinical Impression(s) / ED Diagnoses Final diagnoses:  Cellulitis of right upper extremity    Rx / DC Orders ED Discharge Orders          Ordered    doxycycline (VIBRAMYCIN) 100 MG capsule  2 times daily        11/07/22 1632    cefadroxil (DURICEF) 500 MG capsule  2 times daily        11/07/22 1632    bacitracin ointment  2 times daily PRN        11/07/22 1638              Achille Rich, PA-C 11/11/22 0952    Achille Rich, PA-C 11/11/22 1610    Cathren Laine, MD 11/11/22 [id number]

## 2022-11-07 NOTE — ED Notes (Signed)
Pt verbalized understanding of discharge instructions. Opportunity for questions provided.  

## 2022-11-07 NOTE — Assessment & Plan Note (Signed)
Thick, yellow drainage at PICC insertion site. Surrounding skin is erythematous and edematous. No signs of systemic infection. I do not have supplies in office to completely replace the dressing and better assess the site but it does appear infected. She does not have dressing supplies at home and the nurse only comes weekly. I encouraged her to return to the hospital today to have it better evaluated as it will likely need to be removed.

## 2022-11-07 NOTE — Progress Notes (Signed)
Acute Office Visit  Subjective:     Patient ID: Jillian Shepherd, female    DOB: 1985/08/12, 37 y.o.   MRN: [id number]  Chief Complaint  Patient presents with   [facility] LINE SITE    PIC line site red swollen started last week. Patient states it is painful, and itchy.     HPI Patient is in today for 3-4 days of redness and drainage to her right upper arm PICC site where she receives TPN. She reports she noticed the redness on Monday when her infusion nurse was at her home and the dressing was changed, Since then the redness is worsening and the site has been oozing yellow drainage that has required reinforcement of the dressing. She denies fever, chills, or malaise.   Review of Systems  All other systems reviewed and are negative.   Past Medical History:  Diagnosis Date   Abdominal pain    Alcohol abuse    B12 deficiency    Cancer (HCC)    squmaous cell skin cancer    Collagen vascular disease (HCC)    Hypokalemia    Liddle's syndrome    Obesity (BMI 35.0-39.9 without comorbidity)    Pancreatitis    Pleural effusion    Splenic hemorrhage    Past Surgical History:  Procedure Laterality Date   ADENOIDECTOMY     CHEST TUBE INSERTION     COLONOSCOPY WITH PROPOFOL N/A 10/06/2020   Procedure: COLONOSCOPY WITH PROPOFOL;  Surgeon: Lin Landsman, MD;  Location: [facility] ENDOSCOPY;  Service: Gastroenterology;  Laterality: N/A;  COVID POSITIVE 08/23/2020   ESOPHAGOGASTRODUODENOSCOPY (EGD) WITH PROPOFOL N/A 10/06/2020   Procedure: ESOPHAGOGASTRODUODENOSCOPY (EGD) WITH PROPOFOL;  Surgeon: Lin Landsman, MD;  Location: [place];  Service: Gastroenterology;  Laterality: N/A;   skin cancer removal Left    leg   TONSILLECTOMY     Current Outpatient Medications on File Prior to Visit  Medication Sig Dispense Refill   amLODipine (NORVASC) 10 MG tablet Take 1 tablet (10 mg total) by mouth daily. (Patient taking differently: Take 10 mg by mouth in the morning.) 90 tablet 3    bisacodyl (DULCOLAX) 10 MG suppository Place 1 suppository (10 mg total) rectally daily as needed for moderate constipation. 12 suppository 0   buPROPion (WELLBUTRIN XL) 150 MG 24 hr tablet Take 1 tablet (150 mg total) by mouth daily. 30 tablet 5   cyanocobalamin (VITAMIN B12) 1000 MCG/ML injection Inject 1 mL (1,000 mcg total) into the muscle every 30 (thirty) days. 1 mL 11   fentaNYL (DURAGESIC) 25 MCG/HR Place 1 patch onto the skin every 3 (three) days. 10 patch 0   folic acid (FOLVITE) 1 MG tablet Take 1 tablet (1 mg total) by mouth daily. (Patient taking differently: Take 1 mg by mouth in the morning.) 30 tablet 11   hydrOXYzine (VISTARIL) 25 MG capsule Take 1 capsule (25 mg total) by mouth every 8 (eight) hours as needed (for sleep). 30 capsule 0   KLOR-CON M20 20 MEQ tablet Take by mouth.     lipase/protease/amylase (CREON) 36000 UNITS CPEP capsule Take 2 capsules (72,000 Units total) by mouth 3 (three) times daily with meals. May also take 1 capsule (36,000 Units total) as needed (with snacks). 240 capsule 11   metFORMIN (GLUCOPHAGE) 500 MG tablet Take 500 mg by mouth 2 (two) times daily.     Oxycodone HCl 10 MG TABS Take 1 tablet (10 mg total) by mouth every 6 (six) hours as needed (breakthrough pain only). [place]  tablet 0   spironolactone (ALDACTONE) 25 MG tablet Take 25 mg by mouth 2 (two) times daily.     valACYclovir (VALTREX) 1000 MG tablet Take 1 tablet (1,000 mg total) by mouth 3 (three) times daily. 21 tablet 0   pantoprazole (PROTONIX) 40 MG tablet Take 1 tablet (40 mg total) by mouth daily. 30 tablet 0   No current facility-administered medications on file prior to visit.   No Known Allergies     Objective:    BP 110/84   Pulse (!) 105   Temp 98.1 F (36.7 C) (Oral)   Ht 5\' 3"  (1.6 m)   Wt 153 lb (69.4 kg)   SpO2 96%   BMI 27.10 kg/m    Physical Exam Vitals and nursing note reviewed.  Constitutional:      Appearance: Normal appearance. She is normal weight.  HENT:      Head: Normocephalic and atraumatic.  Skin:    General: Skin is warm and dry.     Findings: Erythema present.       Neurological:     General: No focal deficit present.     Mental Status: She is alert and oriented to person, place, and time. Mental status is at baseline.  Psychiatric:        Mood and Affect: Mood normal.        Behavior: Behavior normal.        Thought Content: Thought content normal.        Judgment: Judgment normal.     No results found for any visits on 11/07/22.      Assessment & Plan:   Problem List Items Addressed This Visit       Other   PICC line infection, initial encounter - Primary    Thick, yellow drainage at PICC insertion site. Surrounding skin is erythematous and edematous. No signs of systemic infection. I do not have supplies in office to completely replace the dressing and better assess the site but it does appear infected. She does not have dressing supplies at home and the nurse only comes weekly. I encouraged her to return to the hospital today to have it better evaluated as it will likely need to be removed.        No orders of the defined types were placed in this encounter.   No follow-ups on file.  Rubie Maid, FNP

## 2022-11-07 NOTE — Discharge Instructions (Addendum)
You were seen in the ER today for evaluation of your irritation near your PICC line.  I agree that your PICC line needed to be removed.  I am glad you are able to remove this for you today.  Please follow-up with your GI doctor to let them know that we removed her PICC line due to the inflammation and discharge from it.  We have run some blood cultures.  Please make sure you answer your phone call to see if these are positive.  Should result within the next few days.  You did not have any other signs of infection such as fever or elevated white blood cell count.  I am going to start you on 2 antibiotics which she will take twice a day for the next 7 days.  Please complete the entirety of the course.  I am also prescribing you some bacitracin ointment to apply to the surrounding area.  Please do not place this in your PICC line home.  Please make sure you leave the bandage on for the next 24 hours and can apply Band-Aids after that.  Make sure you are cleaning the area daily with Dial soap and water.  If you start having a fever, worsening pain, worsening redness, swelling, numbness, tingling, please return to the nearest emergency department for evaluation.  If you have any concerns, new or worsening symptoms, please return to the nearest emergency department for evaluation.  Contact a doctor if: You have a fever. You do not start to get better after 1-2 days of treatment. Your bone or joint under the infected area starts to hurt after the skin has healed. Your infection comes back in the same area or another area. Signs of this may include: You have a swollen bump in the area. Your red area gets larger, turns dark in color, or hurts more. You have more fluid coming from the wound. Pus or a bad smell develops in your infected area. You have more pain. You feel sick and have muscle aches and weakness. You develop vomiting or watery poop that will not go away. Get help right away if: You see red streaks  coming from the area. You notice the skin turns purple or black and falls off. These symptoms may be an emergency. Get help right away. Call 911. Do not wait to see if the symptoms will go away. Do not drive yourself to the hospital.

## 2022-11-08 ENCOUNTER — Telehealth: Payer: Self-pay

## 2022-11-08 NOTE — Transitions of Care (Post Inpatient/ED Visit) (Signed)
   11/08/2022  Name: Jillian Shepherd MRN: [id number] DOB: 21-Jun-1986  Today's TOC FU Call Status: Today's TOC FU Call Status:: Unsuccessul Call (1st Attempt) Unsuccessful Call (1st Attempt) Date: 11/08/22  Attempted to reach the patient regarding the most recent Inpatient/ED visit.  Follow Up Plan: Additional outreach attempts will be made to reach the patient to complete the Transitions of Care (Post Inpatient/ED visit) call.   Signature Juanda Crumble, [place] Direct Dial [phone]

## 2022-11-09 NOTE — Transitions of Care (Post Inpatient/ED Visit) (Signed)
   11/09/2022  Name: Jillian Shepherd MRN: [id number] DOB: [birth year]  Today's TOC FU Call Status: Today's TOC FU Call Status:: Successful TOC FU Call Competed Unsuccessful Call (1st Attempt) Date: 11/08/22 [facility] FU Call Complete Date: 11/09/22  Transition Care Management Follow-up Telephone Call Date of Discharge: 11/07/22 [facility] Type of Discharge: Emergency Department Reason for ED Visit: Other: (cellulitis) How have you been since you were released from the hospital?: Better Any questions or concerns?: No  Items Reviewed: Did you receive and understand the discharge instructions provided?: Yes Medications obtained and verified?: Yes (Medications Reviewed) Any new allergies since your discharge?: No Dietary orders reviewed?: Yes Do you have support at home?: Yes People in Home: spouse  Home Care and Equipment/Supplies: Were Home Health Services Ordered?: NA Any new equipment or medical supplies ordered?: NA  Functional Questionnaire: Do you need assistance with bathing/showering or dressing?: No Do you need assistance with meal preparation?: No Do you need assistance with eating?: No Do you have difficulty maintaining continence: No Do you need assistance with getting out of bed/getting out of a chair/moving?: No Do you have difficulty managing or taking your medications?: No  Follow up appointments reviewed: PCP Follow-up appointment confirmed?: No (declined appt) Specialist Hospital Follow-up appointment confirmed?: NA Do you need transportation to your follow-up appointment?: No Do you understand care options if your condition(s) worsen?: Yes-patient verbalized understanding    SIGNATURE Karena Addison, LPN [facility] Nurse Health Advisor Direct Dial [phone]

## 2022-11-12 LAB — CULTURE, BLOOD (SINGLE)
Culture: NO GROWTH
Culture: NO GROWTH
Special Requests: ADEQUATE
Special Requests: ADEQUATE

## 2022-11-30 ENCOUNTER — Encounter: Payer: Self-pay | Admitting: Family Medicine

## 2022-11-30 ENCOUNTER — Other Ambulatory Visit: Payer: Self-pay | Admitting: Family Medicine

## 2022-11-30 MED ORDER — OXYCODONE HCL 10 MG PO TABS: 10.0000 mg | ORAL_TABLET | Freq: Four times a day (QID) | ORAL | 0 refills | Status: DC | PRN

## 2022-11-30 MED ORDER — ONDANSETRON HCL 4 MG PO TABS: 4.0000 mg | ORAL_TABLET | Freq: Three times a day (TID) | ORAL | 0 refills | Status: DC | PRN

## 2022-12-04 ENCOUNTER — Ambulatory Visit: Payer: 59 | Admitting: Gastroenterology

## 2022-12-19 ENCOUNTER — Encounter: Payer: Self-pay | Admitting: Family Medicine

## 2022-12-21 ENCOUNTER — Emergency Department (HOSPITAL_COMMUNITY)
Admission: EM | Admit: 2022-12-21 | Discharge: 2022-12-22 | Disposition: A | Discharge status: Home / Self Care | Payer: 59 | Attending: Emergency Medicine | Admitting: Emergency Medicine

## 2022-12-21 ENCOUNTER — Telehealth: Payer: Self-pay

## 2022-12-21 ENCOUNTER — Other Ambulatory Visit: Payer: Self-pay

## 2022-12-21 ENCOUNTER — Encounter (HOSPITAL_COMMUNITY): Payer: Self-pay | Admitting: Emergency Medicine

## 2022-12-21 DIAGNOSIS — Z79899 Other long term (current) drug therapy: Secondary | ICD-10-CM | POA: Insufficient documentation

## 2022-12-21 DIAGNOSIS — O26891 Other specified pregnancy related conditions, first trimester: Secondary | ICD-10-CM | POA: Insufficient documentation

## 2022-12-21 DIAGNOSIS — Z3A01 Less than 8 weeks gestation of pregnancy: Secondary | ICD-10-CM | POA: Insufficient documentation

## 2022-12-21 DIAGNOSIS — Z85828 Personal history of other malignant neoplasm of skin: Secondary | ICD-10-CM | POA: Diagnosis not present

## 2022-12-21 DIAGNOSIS — D72829 Elevated white blood cell count, unspecified: Secondary | ICD-10-CM | POA: Diagnosis not present

## 2022-12-21 DIAGNOSIS — R000 Tachycardia, unspecified: Secondary | ICD-10-CM | POA: Diagnosis not present

## 2022-12-21 LAB — URINALYSIS, ROUTINE W REFLEX MICROSCOPIC
Bilirubin Urine: NEGATIVE
Glucose, UA: NEGATIVE mg/dL
Hgb urine dipstick: NEGATIVE
Ketones, ur: NEGATIVE mg/dL
Leukocytes,Ua: NEGATIVE
Nitrite: NEGATIVE
Protein, ur: NEGATIVE mg/dL
Specific Gravity, Urine: 1.004 — ABNORMAL LOW (ref 1.005–1.030)
pH: 6 (ref 5.0–8.0)

## 2022-12-21 NOTE — ED Triage Notes (Addendum)
Pt states she took a Unisom to help her sleep then states her stomach started bothering her. Thinks her pancreas may be "acting up".

## 2022-12-21 NOTE — Telephone Encounter (Signed)
Pt called triage to get some advise. She just found out she's pregnant (positive UPT's at home). She has been diagnosed with necrotizing pancreatitis and spleen hematoma with recent MRI. Her doctor advised her to call us to get some advise on this. She denies having any vaginal spotting/bleeding or cramping/severe pelvic pain. She said she needs surgery but not sure what needs to happen now since she is pregnant. Can you pls advise? Based on her last period (11/28/22) she is about [redacted]w[redacted]d as of today.

## 2022-12-22 ENCOUNTER — Encounter (HOSPITAL_COMMUNITY): Payer: Self-pay | Admitting: Emergency Medicine

## 2022-12-22 ENCOUNTER — Emergency Department (HOSPITAL_COMMUNITY): Payer: 59

## 2022-12-22 LAB — COMPREHENSIVE METABOLIC PANEL
ALT: 13 U/L (ref 0–44)
AST: 22 U/L (ref 15–41)
Albumin: 4 g/dL (ref 3.5–5.0)
Alkaline Phosphatase: 69 U/L (ref 38–126)
Anion gap: 10 (ref 5–15)
BUN: 8 mg/dL (ref 6–20)
CO2: 23 mmol/L (ref 22–32)
Calcium: 9.3 mg/dL (ref 8.9–10.3)
Chloride: 106 mmol/L (ref 98–111)
Creatinine, Ser: 0.55 mg/dL (ref 0.44–1.00)
GFR, Estimated: >60 mL/min (ref >60)
Glucose, Bld: 115 mg/dL — ABNORMAL HIGH (ref 70–99)
Potassium: 3.2 mmol/L — ABNORMAL LOW (ref 3.5–5.1)
Sodium: 139 mmol/L (ref 135–145)
Total Bilirubin: 0.6 mg/dL (ref 0.3–1.2)
Total Protein: 8 g/dL (ref 6.5–8.1)

## 2022-12-22 LAB — HCG, QUANTITATIVE, PREGNANCY: hCG, Beta Chain, Quant, S: 125949 m[IU]/mL — ABNORMAL HIGH (ref <5)

## 2022-12-22 LAB — CBC
HCT: 44.1 % (ref 36.0–46.0)
Hemoglobin: 14.8 g/dL (ref 12.0–15.0)
MCH: 28.8 pg (ref 26.0–34.0)
MCHC: 33.6 g/dL (ref 30.0–36.0)
MCV: 86 fL (ref 80.0–100.0)
Platelets: 411 10*3/uL — ABNORMAL HIGH (ref 150–400)
RBC: 5.13 MIL/uL — ABNORMAL HIGH (ref 3.87–5.11)
RDW: 18.9 % — ABNORMAL HIGH (ref 11.5–15.5)
WBC: 12.4 10*3/uL — ABNORMAL HIGH (ref 4.0–10.5)
nRBC: 0 % (ref 0.0–0.2)

## 2022-12-22 LAB — LIPASE, BLOOD: Lipase: 28 U/L (ref 11–51)

## 2022-12-22 LAB — LACTIC ACID, PLASMA: Lactic Acid, Venous: 1.9 mmol/L (ref 0.5–1.9)

## 2022-12-22 LAB — POC URINE PREG, ED: Preg Test, Ur: POSITIVE — AB

## 2022-12-22 MED ORDER — ACETAMINOPHEN 325 MG PO TABS
650.0000 mg | ORAL_TABLET | Freq: Once | ORAL | Status: AC
  Administered 2022-12-22: 650 mg via ORAL
  Filled 2022-12-22: qty 2

## 2022-12-22 NOTE — ED Notes (Signed)
Pt in [place]

## 2022-12-22 NOTE — Telephone Encounter (Signed)
If she has medical conditions that require surgery, it is still best that she undergo surgery.  Surgery can be performed in pregnancy, however there is a risk of exposure to the fetus to anesthetics and miscarriage, but typically they still do well.  If her surgery is not absolutely necessary at this time, then it would be best if she can hold off until after the pregnancy.  As it has been a while since she has seen an OB/GYN, I would recommend her make an appointment for establishing care with this pregnancy (confirmation appointment) with an MD to further discuss her medical issues at this time.

## 2022-12-22 NOTE — Discharge Instructions (Signed)
Please call  your OBGYN next week Please try to cut back on smoking Please start taking prenatal vitamins

## 2022-12-22 NOTE — ED Provider Notes (Signed)
[place] EMERGENCY DEPARTMENT AT [facility] Provider Note   CSN: [id number] Arrival date & time: 12/21/22  2231     History  Chief Complaint  Patient presents with   Abdominal Pain    Jillian Shepherd is a 37 y.o. female.  The history is provided by the patient.  Patient with history of alcohol induced pancreatitis presents with abdominal pain. Patient reports she has had difficulty sleeping recently, she took a Unisom and soon after began having generalized abdominal pain.  She suspects that her pancreas is starting to become inflamed.  No fevers or vomiting.  No vaginal bleeding.  No new chest pain or shortness of breath.  No recent alcohol use.     Past Medical History:  Diagnosis Date   Abdominal pain    Alcohol abuse    B12 deficiency    Cancer (HCC)    squmaous cell skin cancer    Collagen vascular disease (HCC)    Hypokalemia    Liddle's syndrome    Obesity (BMI 35.0-39.9 without comorbidity)    Pancreatitis    Pleural effusion    Splenic hemorrhage     Home Medications Prior to Admission medications   Medication Sig Start Date End Date Taking? Authorizing Provider  amLODipine (NORVASC) 10 MG tablet Take 1 tablet (10 mg total) by mouth daily. Patient taking differently: Take 10 mg by mouth in the morning. 04/10/22   Park Meo, FNP  bacitracin ointment Apply 1 Application topically 2 (two) times daily as needed for wound care. 11/07/22   Achille Rich, PA-C  bisacodyl (DULCOLAX) 10 MG suppository Place 1 suppository (10 mg total) rectally daily as needed for moderate constipation. 08/23/22   Johnson, Clanford L, MD  buPROPion (WELLBUTRIN XL) 150 MG 24 hr tablet Take 1 tablet (150 mg total) by mouth daily. 11/06/22   [name], MD  cyanocobalamin (VITAMIN B12) 1000 MCG/ML injection Inject 1 mL (1,000 mcg total) into the muscle every 30 (thirty) days. 05/17/22   [name], MD  folic acid (FOLVITE) 1 MG tablet Take 1 tablet (1 mg total) by  mouth daily. Patient taking differently: Take 1 mg by mouth in the morning. 05/17/22 05/12/23  [name], MD  hydrOXYzine (VISTARIL) 25 MG capsule Take 1 capsule (25 mg total) by mouth every 8 (eight) hours as needed (for sleep). 08/30/22   [name], MD  KLOR-CON M20 20 MEQ tablet Take by mouth. 09/08/22   [provider]  lipase/protease/amylase (CREON) 36000 UNITS CPEP capsule Take 2 capsules (72,000 Units total) by mouth 3 (three) times daily with meals. May also take 1 capsule (36,000 Units total) as needed (with snacks). 07/20/22   Park Meo, FNP  ondansetron (ZOFRAN) 4 MG tablet Take 1 tablet (4 mg total) by mouth every 8 (eight) hours as needed for nausea or vomiting. 11/30/22   [name], MD  Oxycodone HCl 10 MG TABS Take 1 tablet (10 mg total) by mouth every 6 (six) hours as needed (breakthrough pain only). 11/30/22   [name], MD  pantoprazole (PROTONIX) 40 MG tablet Take 1 tablet (40 mg total) by mouth daily. 07/13/22 09/04/22  Kendell Bane, MD      Allergies    Patient has no known allergies.    Review of Systems   Review of Systems  Constitutional:  Negative for fever.  Gastrointestinal:  Positive for abdominal pain. Negative for vomiting.  Genitourinary:  Negative for vaginal bleeding.  Physical Exam Updated Vital Signs BP 126/87 (BP Location: Right Arm)   Pulse 96   Temp 98.2 F (36.8 C) (Oral)   Resp 18   Ht 1.6 m (5\' 3" )   Wt 69 kg   LMP 11/28/2022 (Exact Date)   SpO2 100%   BMI 26.95 kg/m  Physical Exam CONSTITUTIONAL: Appears older than stated age, no acute distress HEAD: Normocephalic/atraumatic EYES: EOMI/PERRL ENMT: Mucous membranes moist NECK: supple no meningeal signs SPINE/BACK:entire spine nontender CV: S1/S2 noted, tachycardic LUNGS: Lungs are clear to auscultation bilaterally, no apparent distress ABDOMEN: soft, mild diffuse tenderness, no rebound or guarding, bowel sounds noted throughout  abdomen GU:no cva tenderness NEURO: Pt is awake/alert/appropriate, moves all extremitiesx4.  No facial droop.   EXTREMITIES: pulses normal/equal, full ROM SKIN: warm, color normal PSYCH: no abnormalities of mood noted, alert and oriented to situation  ED Results / Procedures / Treatments   Labs (all labs ordered are listed, but only abnormal results are displayed) Labs Reviewed  COMPREHENSIVE METABOLIC PANEL - Abnormal; Notable for the following components:      Result Value   Potassium 3.2 (*)    Glucose, Bld 115 (*)    All other components within normal limits  CBC - Abnormal; Notable for the following components:   WBC 12.4 (*)    RBC 5.13 (*)    RDW 18.9 (*)    Platelets 411 (*)    All other components within normal limits  URINALYSIS, ROUTINE W REFLEX MICROSCOPIC - Abnormal; Notable for the following components:   Color, Urine STRAW (*)    Specific Gravity, Urine 1.004 (*)    All other components within normal limits  HCG, QUANTITATIVE, PREGNANCY - Abnormal; Notable for the following components:   hCG, Beta Chain, Quant, S 125,949 (*)    All other components within normal limits  POC URINE PREG, ED - Abnormal; Notable for the following components:   Preg Test, Ur Positive (*)    All other components within normal limits  LIPASE, BLOOD  LACTIC ACID, PLASMA  LACTIC ACID, PLASMA    EKG EKG Interpretation  Date/Time:  Friday Dec 21 2022 23:17:07 EDT Ventricular Rate:  109 PR Interval:  128 QRS Duration: 105 QT Interval:  350 QTC Calculation: 472 R Axis:   111 Text Interpretation: Sinus tachycardia Ventricular premature complex Inferior infarct, old Confirmed by Zadie Rhine ([id number]) on 12/21/2022 11:24:09 PM  Radiology US OB LESS THAN 14 WEEKS WITH OB TRANSVAGINAL  Result Date: 12/22/2022 CLINICAL DATA:  Abdominal pain EXAM: OBSTETRIC <14 WK [place] AND TRANSVAGINAL OB US TECHNIQUE: Both transabdominal and transvaginal ultrasound examinations were performed for  complete evaluation of the gestation as well as the maternal uterus, adnexal regions, and pelvic cul-de-sac. Transvaginal technique was performed to assess early pregnancy. COMPARISON:  None Available. FINDINGS: Intrauterine gestational sac: Present, single Yolk sac:  Present, single, normal appearing Embryo:  Present, single Cardiac Activity: Present, regular Heart Rate: 167 bpm CRL:  12 mm   7 w   2 d                  [place] EDC: 08/08/2023 Subchorionic hemorrhage:  A small subchorionic hemorrhage is seen Maternal uterus/adnexae: The uterus is anteverted. The cervix is closed and is unremarkable. No intrauterine masses are seen. There is no free fluid within the pelvis. The right ovary is normal in size and echogenicity. The left ovary is not visualized. IMPRESSION: Single living intrauterine gestation with an estimated gestational age of [redacted] weeks, 2  days. Small subchorionic hemorrhage Electronically Signed   By: Helyn Numbers M.D.   On: 12/22/2022 01:13    Procedures Procedures    Medications Ordered in ED Medications  acetaminophen (TYLENOL) tablet 650 mg (650 mg Oral Given 12/22/22 0107)    ED Course/ Medical Decision Making/ A&P Clinical Course as of 12/22/22 0159  Sat Dec 22, 2022  0124 WBC(!): 12.4 Leukocytosis [DW]  0130 Patient with complicated history including alcohol use disorder and recurrent pancreatitis and splenic hematoma presents with abdominal pain.  Patient initially tachycardic that improved.  On my exam she was overall in no distress.  Patient found to be pregnant with single IUP at around 7 weeks.  Patient reports she was unaware that she was pregnant.  Denies any vaginal bleeding [DW]  0157 Patient reports feeling improved.  We discussed ultrasound findings.  She is already stopped spironolactone.  She will also stop metformin for now.  She will continue the rest of her meds, and will follow-up with OB/GYN next week for further guidance.  She was advised to start taking a  prenatal vitamin [DW]  0158 Counseled patient on smoking cessation.  She is already stopped alcohol use.  No signs of acute pancreatitis or other acute abdominal emergency.  Patient is safe for discharge home.  We discussed return precautions [DW]    Clinical Course User Index [DW] Zadie Rhine, MD                             Medical Decision Making Amount and/or Complexity of Data Reviewed Labs: ordered. Decision-making details documented in ED Course. Radiology: ordered.  Risk OTC drugs.   This patient presents to the ED for concern of abdominal pain, this involves an extensive number of treatment options, and is a complaint that carries with it a high risk of complications and morbidity.  The differential diagnosis includes but is not limited to cholecystitis, cholelithiasis, pancreatitis, gastritis, peptic ulcer disease, appendicitis, bowel obstruction, bowel perforation, diverticulitis, AAA, ischemic bowel, ectopic pregnancy, PID    Comorbidities that complicate the patient evaluation: Patient's presentation is complicated by their history of recurrent pancreatitis, splenic hematoma  Social Determinants of Health: Patient's  alcohol use disorder   increases the complexity of managing their presentation  Additional history obtained: Records reviewed previous admission documents  Lab Tests: I Ordered, and personally interpreted labs.  The pertinent results include: Leukocytosis, patient is pregnant  Imaging Studies ordered: I ordered imaging studies including Ultrasound OB ultrasound   I independently visualized and interpreted imaging which showed single IUP at 7 weeks I agree with the radiologist interpretation  Medicines ordered and prescription drug management: I ordered medication including Tylenol for pain Reevaluation of the patient after these medicines showed that the patient    improved   Reevaluation: After the interventions noted above, I reevaluated the  patient and found that they have :improved  Complexity of problems addressed: Patient's presentation is most consistent with  acute presentation with potential threat to life or bodily function  Disposition: After consideration of the diagnostic results and the patient's response to treatment,  I feel that the patent would benefit from discharge   .           Final Clinical Impression(s) / ED Diagnoses Final diagnoses:  Less than [redacted] weeks gestation of pregnancy    Rx / DC Orders ED Discharge Orders     None  Zadie Rhine, MD 12/22/22 [phone]

## 2022-12-24 NOTE — Telephone Encounter (Signed)
Pt aware. After information given, she asked if she could call back because her husband was calling her.

## 2022-12-25 NOTE — Telephone Encounter (Signed)
Called pt to f/u on our last phone call, she said she was going to call back to schedule and as of now, she has not scheduled an appt. No answer, LVMTRC.

## 2022-12-27 ENCOUNTER — Ambulatory Visit (INDEPENDENT_AMBULATORY_CARE_PROVIDER_SITE_OTHER): Payer: 59 | Admitting: Family Medicine

## 2022-12-27 ENCOUNTER — Encounter: Payer: Self-pay | Admitting: Family Medicine

## 2022-12-27 VITALS — BP 110/56 | HR 97 | Temp 98.5°F | Ht 63.0 in | Wt 155.8 lb

## 2022-12-27 DIAGNOSIS — F319 Bipolar disorder, unspecified: Secondary | ICD-10-CM | POA: Diagnosis not present

## 2022-12-27 MED ORDER — CARIPRAZINE HCL 1.5 MG PO CAPS: 1.5000 mg | ORAL_CAPSULE | Freq: Every day | ORAL | 1 refills | Status: DC

## 2022-12-27 NOTE — Progress Notes (Signed)
Subjective:    Patient ID: Jillian Shepherd, female    DOB: [birth year], 37 y.o.   MRN: [id number]  Insomnia  Recently started Wellbutrin to help with smoking cessation.  Around the same time she developed irritability, mood swings, episodes of restlessness.  She feels like she has to move.  She reports trouble sleeping.  She tosses and turns in bed at night.  She has increased energy.  She feels like she has to get up at night to clean her home.  None of the sleeping pills are helping.  She also has some underlying history of bipolar and her grandmother.  She states that when she was younger she had episodes that she would characterize as mania.  She reports weeks of time where she would have tons of energy, difficulty sleeping, acting as if she was driven by a motor.  She denies any hallucinations.  She denies any suicidal thoughts.  She does report restlessness and increased energy and trouble sleeping Past Medical History:  Diagnosis Date   Abdominal pain    Alcohol abuse    B12 deficiency    Cancer (HCC)    squmaous cell skin cancer    Collagen vascular disease (HCC)    Hypokalemia    Liddle's syndrome    Obesity (BMI 35.0-39.9 without comorbidity)    Pancreatitis    Pleural effusion    Splenic hemorrhage    Past Surgical History:  Procedure Laterality Date   ADENOIDECTOMY     CHEST TUBE INSERTION     COLONOSCOPY WITH PROPOFOL N/A 10/06/2020   Procedure: COLONOSCOPY WITH PROPOFOL;  Surgeon: Toney Reil, MD;  Location: [facility] ENDOSCOPY;  Service: Gastroenterology;  Laterality: N/A;  COVID POSITIVE 08/23/2020   ESOPHAGOGASTRODUODENOSCOPY (EGD) WITH PROPOFOL N/A 10/06/2020   Procedure: ESOPHAGOGASTRODUODENOSCOPY (EGD) WITH PROPOFOL;  Surgeon: Toney Reil, MD;  Location: [facility] ENDOSCOPY;  Service: Gastroenterology;  Laterality: N/A;   skin cancer removal Left    leg   TONSILLECTOMY     Current Outpatient Medications on File Prior to Visit  Medication Sig Dispense  Refill   amLODipine (NORVASC) 10 MG tablet Take 1 tablet (10 mg total) by mouth daily. (Patient taking differently: Take 10 mg by mouth in the morning.) 90 tablet 3   bacitracin ointment Apply 1 Application topically 2 (two) times daily as needed for wound care. 120 g 0   bisacodyl (DULCOLAX) 10 MG suppository Place 1 suppository (10 mg total) rectally daily as needed for moderate constipation. 12 suppository 0   buPROPion (WELLBUTRIN XL) 150 MG 24 hr tablet Take 1 tablet (150 mg total) by mouth daily. 30 tablet 5   cyanocobalamin (VITAMIN B12) 1000 MCG/ML injection Inject 1 mL (1,000 mcg total) into the muscle every 30 (thirty) days. 1 mL 11   folic acid (FOLVITE) 1 MG tablet Take 1 tablet (1 mg total) by mouth daily. (Patient taking differently: Take 1 mg by mouth in the morning.) 30 tablet 11   hydrOXYzine (VISTARIL) 25 MG capsule Take 1 capsule (25 mg total) by mouth every 8 (eight) hours as needed (for sleep). 30 capsule 0   KLOR-CON M20 20 MEQ tablet Take by mouth.     lipase/protease/amylase (CREON) 36000 UNITS CPEP capsule Take 2 capsules (72,000 Units total) by mouth 3 (three) times daily with meals. May also take 1 capsule (36,000 Units total) as needed (with snacks). 240 capsule 11   ondansetron (ZOFRAN) 4 MG tablet Take 1 tablet (4 mg total) by mouth every 8 (eight)  hours as needed for nausea or vomiting. 30 tablet 0   Oxycodone HCl 10 MG TABS Take 1 tablet (10 mg total) by mouth every 6 (six) hours as needed (breakthrough pain only). 120 tablet 0   pantoprazole (PROTONIX) 40 MG tablet Take 1 tablet (40 mg total) by mouth daily. 30 tablet 0   No current facility-administered medications on file prior to visit.   No Known Allergies Social History   Socioeconomic History   Marital status: Married    Spouse name: Not on file   Number of children: Not on file   Years of education: Not on file   Highest education level: Not on file  Occupational History   Not on file  Tobacco Use    Smoking status: Every Day    Packs/day: 0.25    Years: 9.00    Additional pack years: 0.00    Total pack years: 2.25    Types: Cigarettes    Passive exposure: Current   Smokeless tobacco: Never  Vaping Use   Vaping Use: Never used  Substance and Sexual Activity   Alcohol use: Yes    Alcohol/week: 2.0 standard drinks of alcohol    Types: 2 Cans of beer per week   Drug use: No   Sexual activity: Yes    Birth control/protection: None  Other Topics Concern   Not on file  Social History Narrative   ** Merged History Encounter **       Social Determinants of Health   Financial Resource Strain: Not on file  Food Insecurity: No Food Insecurity (08/15/2022)   Hunger Vital Sign    Worried About Running Out of Food in the Last Year: Never true    Ran Out of Food in the Last Year: Never true  Transportation Needs: No Transportation Needs (08/15/2022)   PRAPARE - Administrator, Civil Service (Medical): No    Lack of Transportation (Non-Medical): No  Physical Activity: Not on file  Stress: Not on file  Social Connections: Not on file  Intimate Partner Violence: Not At Risk (08/15/2022)   Humiliation, Afraid, Rape, and Kick questionnaire    Fear of Current or Ex-Partner: No    Emotionally Abused: No    Physically Abused: No    Sexually Abused: No     Review of Systems  Psychiatric/Behavioral:  The patient has insomnia.   All other systems reviewed and are negative.      Objective:   Physical Exam Vitals reviewed.  Constitutional:      General: She is not in acute distress.    Appearance: Normal appearance. She is normal weight. She is not ill-appearing, toxic-appearing or diaphoretic.  HENT:     Nose: Nose normal. No congestion or rhinorrhea.     Mouth/Throat:     Pharynx: Oropharynx is clear. No oropharyngeal exudate or posterior oropharyngeal erythema.  Eyes:     General: No scleral icterus.    Conjunctiva/sclera: Conjunctivae normal.  Cardiovascular:      Rate and Rhythm: Normal rate and regular rhythm.     Pulses: Normal pulses.     Heart sounds: Normal heart sounds. No murmur heard.    No friction rub. No gallop.  Pulmonary:     Effort: Pulmonary effort is normal. No respiratory distress.     Breath sounds: Normal breath sounds. No stridor. No wheezing, rhonchi or rales.  Chest:     Chest wall: No tenderness.  Abdominal:     General: Abdomen is flat.  Bowel sounds are normal. There is no distension.     Palpations: Abdomen is soft. There is no mass.     Tenderness: There is abdominal tenderness. There is no right CVA tenderness, left CVA tenderness, guarding or rebound.     Hernia: No hernia is present.  Musculoskeletal:     Cervical back: Neck supple.  Neurological:     Mental Status: She is alert.           Assessment & Plan:  Bipolar 1 disorder (HCC) I believe the patient may have undiagnosed bipolar disorder.  Recommended starting Vraylar 1.5 mg daily and recheck in 2 weeks.  She recently discovered she is pregnant.  However she is planning to have an abortion because of her high risk medical problems.  We discussed potential teratogenic effects of her medication.  Patient has decided that she is not keeping the pregnancy.

## 2023-01-02 ENCOUNTER — Encounter: Payer: Self-pay | Admitting: Family Medicine

## 2023-01-02 ENCOUNTER — Other Ambulatory Visit: Payer: Self-pay | Admitting: Family Medicine

## 2023-01-02 MED ORDER — OXYCODONE HCL 10 MG PO TABS: 10.0000 mg | ORAL_TABLET | Freq: Four times a day (QID) | ORAL | 0 refills | Status: DC | PRN

## 2023-01-28 ENCOUNTER — Encounter: Payer: Self-pay | Admitting: Family Medicine

## 2023-01-28 ENCOUNTER — Other Ambulatory Visit: Payer: Self-pay

## 2023-01-28 DIAGNOSIS — F319 Bipolar disorder, unspecified: Secondary | ICD-10-CM

## 2023-01-28 MED ORDER — CARIPRAZINE HCL 1.5 MG PO CAPS
1.5000 mg | ORAL_CAPSULE | Freq: Every day | ORAL | 1 refills | Status: DC
Start: 2023-01-28 — End: 2023-10-02

## 2023-02-04 ENCOUNTER — Encounter: Payer: Self-pay | Admitting: Family Medicine

## 2023-02-04 MED ORDER — OXYCODONE HCL 10 MG PO TABS: 10.0000 mg | ORAL_TABLET | Freq: Four times a day (QID) | ORAL | 0 refills | Status: DC | PRN

## 2023-02-28 ENCOUNTER — Other Ambulatory Visit: Payer: Self-pay | Admitting: Family Medicine

## 2023-02-28 MED ORDER — OXYCODONE HCL 10 MG PO TABS: 10.0000 mg | ORAL_TABLET | Freq: Four times a day (QID) | ORAL | 0 refills | Status: DC | PRN

## 2023-03-07 ENCOUNTER — Other Ambulatory Visit: Payer: Self-pay | Admitting: Family Medicine

## 2023-03-11 MED ORDER — ONDANSETRON HCL 4 MG PO TABS: 4.0000 mg | ORAL_TABLET | Freq: Three times a day (TID) | ORAL | 0 refills | Status: DC | PRN

## 2023-03-28 ENCOUNTER — Other Ambulatory Visit: Payer: Self-pay | Admitting: Family Medicine

## 2023-04-01 ENCOUNTER — Other Ambulatory Visit: Payer: Self-pay | Admitting: Family Medicine

## 2023-04-01 ENCOUNTER — Encounter: Payer: Self-pay | Admitting: Family Medicine

## 2023-04-01 MED ORDER — OXYCODONE HCL 10 MG PO TABS: 10.0000 mg | ORAL_TABLET | Freq: Four times a day (QID) | ORAL | 0 refills | Status: DC | PRN

## 2023-04-04 ENCOUNTER — Encounter (HOSPITAL_COMMUNITY): Payer: Self-pay | Admitting: Emergency Medicine

## 2023-04-04 ENCOUNTER — Inpatient Hospital Stay (HOSPITAL_COMMUNITY)
Admission: EM | Admit: 2023-04-04 | Discharge: 2023-04-08 | DRG: 438 | Disposition: A | Discharge status: Home / Self Care | Payer: 59 | Attending: Internal Medicine | Admitting: Internal Medicine

## 2023-04-04 ENCOUNTER — Other Ambulatory Visit: Payer: Self-pay

## 2023-04-04 DIAGNOSIS — E669 Obesity, unspecified: Secondary | ICD-10-CM | POA: Diagnosis present

## 2023-04-04 DIAGNOSIS — F411 Generalized anxiety disorder: Secondary | ICD-10-CM | POA: Diagnosis present

## 2023-04-04 DIAGNOSIS — S36029A Unspecified contusion of spleen, initial encounter: Secondary | ICD-10-CM | POA: Diagnosis present

## 2023-04-04 DIAGNOSIS — I10 Essential (primary) hypertension: Secondary | ICD-10-CM | POA: Diagnosis present

## 2023-04-04 DIAGNOSIS — K852 Alcohol induced acute pancreatitis without necrosis or infection: Principal | ICD-10-CM | POA: Diagnosis present

## 2023-04-04 DIAGNOSIS — D735 Infarction of spleen: Secondary | ICD-10-CM | POA: Diagnosis present

## 2023-04-04 DIAGNOSIS — E876 Hypokalemia: Secondary | ICD-10-CM | POA: Diagnosis present

## 2023-04-04 DIAGNOSIS — I81 Portal vein thrombosis: Secondary | ICD-10-CM | POA: Diagnosis present

## 2023-04-04 DIAGNOSIS — K859 Acute pancreatitis without necrosis or infection, unspecified: Secondary | ICD-10-CM

## 2023-04-04 DIAGNOSIS — K219 Gastro-esophageal reflux disease without esophagitis: Secondary | ICD-10-CM | POA: Insufficient documentation

## 2023-04-04 DIAGNOSIS — E119 Type 2 diabetes mellitus without complications: Secondary | ICD-10-CM | POA: Diagnosis present

## 2023-04-04 DIAGNOSIS — Z833 Family history of diabetes mellitus: Secondary | ICD-10-CM

## 2023-04-04 DIAGNOSIS — Z6830 Body mass index (BMI) 30.0-30.9, adult: Secondary | ICD-10-CM

## 2023-04-04 DIAGNOSIS — Z808 Family history of malignant neoplasm of other organs or systems: Secondary | ICD-10-CM

## 2023-04-04 DIAGNOSIS — F1020 Alcohol dependence, uncomplicated: Secondary | ICD-10-CM | POA: Diagnosis present

## 2023-04-04 DIAGNOSIS — F17210 Nicotine dependence, cigarettes, uncomplicated: Secondary | ICD-10-CM | POA: Diagnosis present

## 2023-04-04 DIAGNOSIS — R748 Abnormal levels of other serum enzymes: Secondary | ICD-10-CM | POA: Insufficient documentation

## 2023-04-04 DIAGNOSIS — E538 Deficiency of other specified B group vitamins: Secondary | ICD-10-CM | POA: Diagnosis present

## 2023-04-04 DIAGNOSIS — F1090 Alcohol use, unspecified, uncomplicated: Secondary | ICD-10-CM | POA: Insufficient documentation

## 2023-04-04 DIAGNOSIS — K860 Alcohol-induced chronic pancreatitis: Secondary | ICD-10-CM | POA: Diagnosis present

## 2023-04-04 DIAGNOSIS — Z79899 Other long term (current) drug therapy: Secondary | ICD-10-CM

## 2023-04-04 DIAGNOSIS — Z85828 Personal history of other malignant neoplasm of skin: Secondary | ICD-10-CM

## 2023-04-04 DIAGNOSIS — Z8249 Family history of ischemic heart disease and other diseases of the circulatory system: Secondary | ICD-10-CM

## 2023-04-04 DIAGNOSIS — E871 Hypo-osmolality and hyponatremia: Secondary | ICD-10-CM | POA: Diagnosis present

## 2023-04-04 DIAGNOSIS — K863 Pseudocyst of pancreas: Secondary | ICD-10-CM | POA: Diagnosis present

## 2023-04-04 DIAGNOSIS — Z818 Family history of other mental and behavioral disorders: Secondary | ICD-10-CM

## 2023-04-04 LAB — URINALYSIS, ROUTINE W REFLEX MICROSCOPIC
Glucose, UA: NEGATIVE mg/dL
Hgb urine dipstick: NEGATIVE
Ketones, ur: 80 mg/dL — AB
Nitrite: NEGATIVE
Protein, ur: 100 mg/dL — AB
Specific Gravity, Urine: 1.021 (ref 1.005–1.030)
pH: 6 (ref 5.0–8.0)

## 2023-04-04 LAB — CBC WITH DIFFERENTIAL/PLATELET
Abs Immature Granulocytes: 0.04 10*3/uL (ref 0.00–0.07)
Basophils Absolute: 0 10*3/uL (ref 0.0–0.1)
Basophils Relative: 0 %
Eosinophils Absolute: 0 10*3/uL (ref 0.0–0.5)
Eosinophils Relative: 0 %
HCT: 46.3 % — ABNORMAL HIGH (ref 36.0–46.0)
Hemoglobin: 15 g/dL (ref 12.0–15.0)
Immature Granulocytes: 0 %
Lymphocytes Relative: 13 %
Lymphs Abs: 1.8 10*3/uL (ref 0.7–4.0)
MCH: 27.1 pg (ref 26.0–34.0)
MCHC: 32.4 g/dL (ref 30.0–36.0)
MCV: 83.6 fL (ref 80.0–100.0)
Monocytes Absolute: 0.5 10*3/uL (ref 0.1–1.0)
Monocytes Relative: 4 %
Neutro Abs: 12 10*3/uL — ABNORMAL HIGH (ref 1.7–7.7)
Neutrophils Relative %: 83 %
Platelets: 303 10*3/uL (ref 150–400)
RBC: 5.54 MIL/uL — ABNORMAL HIGH (ref 3.87–5.11)
RDW: 16.8 % — ABNORMAL HIGH (ref 11.5–15.5)
WBC: 14.4 10*3/uL — ABNORMAL HIGH (ref 4.0–10.5)
nRBC: 0 % (ref 0.0–0.2)

## 2023-04-04 LAB — COMPREHENSIVE METABOLIC PANEL
ALT: 14 U/L (ref 0–44)
AST: 22 U/L (ref 15–41)
Albumin: 4 g/dL (ref 3.5–5.0)
Alkaline Phosphatase: 117 U/L (ref 38–126)
Anion gap: 12 (ref 5–15)
BUN: 11 mg/dL (ref 6–20)
CO2: 25 mmol/L (ref 22–32)
Calcium: 9.2 mg/dL (ref 8.9–10.3)
Chloride: 99 mmol/L (ref 98–111)
Creatinine, Ser: 0.65 mg/dL (ref 0.44–1.00)
GFR, Estimated: >60 mL/min (ref >60)
Glucose, Bld: 131 mg/dL — ABNORMAL HIGH (ref 70–99)
Potassium: 3.6 mmol/L (ref 3.5–5.1)
Sodium: 136 mmol/L (ref 135–145)
Total Bilirubin: 1.3 mg/dL — ABNORMAL HIGH (ref 0.3–1.2)
Total Protein: 8 g/dL (ref 6.5–8.1)

## 2023-04-04 LAB — LIPASE, BLOOD: Lipase: 281 U/L — ABNORMAL HIGH (ref 11–51)

## 2023-04-04 LAB — HCG, QUANTITATIVE, PREGNANCY: hCG, Beta Chain, Quant, S: 1 m[IU]/mL (ref <5)

## 2023-04-04 LAB — PREGNANCY, URINE: Preg Test, Ur: NEGATIVE

## 2023-04-04 MED ORDER — OXYCODONE HCL 5 MG PO TABS
5.0000 mg | ORAL_TABLET | ORAL | Status: AC
  Administered 2023-04-04: 5 mg via ORAL
  Filled 2023-04-04: qty 1

## 2023-04-04 MED ORDER — FOLIC ACID 1 MG PO TABS
1.0000 mg | ORAL_TABLET | Freq: Every day | ORAL | Status: DC
  Administered 2023-04-05: 1 mg via ORAL
  Filled 2023-04-04: qty 1

## 2023-04-04 MED ORDER — HYDROMORPHONE HCL 1 MG/ML IJ SOLN
1.0000 mg | Freq: Once | INTRAMUSCULAR | Status: AC
  Administered 2023-04-05: 1 mg via INTRAVENOUS
  Filled 2023-04-04: qty 1

## 2023-04-04 MED ORDER — FENTANYL CITRATE PF 50 MCG/ML IJ SOSY
50.0000 ug | PREFILLED_SYRINGE | Freq: Once | INTRAMUSCULAR | Status: DC
  Filled 2023-04-04: qty 1

## 2023-04-04 MED ORDER — TRIMETHOBENZAMIDE HCL 100 MG/ML IM SOLN
200.0000 mg | Freq: Once | INTRAMUSCULAR | Status: DC
  Filled 2023-04-04: qty 2

## 2023-04-04 MED ORDER — LORAZEPAM 1 MG PO TABS
1.0000 mg | ORAL_TABLET | ORAL | Status: AC | PRN
  Administered 2023-04-05 – 2023-04-06 (×2): 1 mg via ORAL
  Filled 2023-04-04: qty 1

## 2023-04-04 MED ORDER — THIAMINE MONONITRATE 100 MG PO TABS
100.0000 mg | ORAL_TABLET | Freq: Every day | ORAL | Status: DC
  Administered 2023-04-05: 100 mg via ORAL
  Filled 2023-04-04: qty 1

## 2023-04-04 MED ORDER — METOCLOPRAMIDE HCL 5 MG/ML IJ SOLN
10.0000 mg | INTRAMUSCULAR | Status: AC
  Administered 2023-04-05: 10 mg via INTRAVENOUS
  Filled 2023-04-04: qty 2

## 2023-04-04 MED ORDER — ADULT MULTIVITAMIN W/MINERALS CH
1.0000 | ORAL_TABLET | Freq: Every day | ORAL | Status: DC
  Administered 2023-04-05: 1 via ORAL
  Filled 2023-04-04: qty 1

## 2023-04-04 MED ORDER — LORAZEPAM 2 MG/ML IJ SOLN
1.0000 mg | INTRAMUSCULAR | Status: AC | PRN
  Administered 2023-04-05: 1 mg via INTRAVENOUS
  Filled 2023-04-04 (×2): qty 1

## 2023-04-04 MED ORDER — THIAMINE HCL 100 MG/ML IJ SOLN: 100.0000 mg | Freq: Every day | INTRAMUSCULAR | Status: DC

## 2023-04-04 MED ORDER — SODIUM CHLORIDE 0.9 % IV BOLUS
1000.0000 mL | Freq: Once | INTRAVENOUS | Status: AC
  Administered 2023-04-05: 1000 mL via INTRAVENOUS

## 2023-04-04 MED ORDER — LORAZEPAM 2 MG/ML IJ SOLN: 1.0000 mg | Freq: Once | INTRAMUSCULAR | Status: DC

## 2023-04-04 MED ORDER — FENTANYL CITRATE PF 50 MCG/ML IJ SOSY
50.0000 ug | PREFILLED_SYRINGE | Freq: Once | INTRAMUSCULAR | Status: AC
  Administered 2023-04-04: 50 ug via INTRAMUSCULAR

## 2023-04-04 NOTE — ED Triage Notes (Signed)
Pt reports she is having a flare of her pancreatitis.  Pain 10/10/  Vomiting.  Diaphoretic in triage.  Unable to get into a position of comfort.

## 2023-04-04 NOTE — ED Provider Triage Note (Addendum)
Emergency Medicine Provider Triage Evaluation Note  Joceyln Chastain , a 37 y.o. female  was evaluated in triage.  Pt complains of diffuse abdominal pain but mainly worsened and radiates to there back. Nausea and vomiting. No black or blood to her emesis. Denies any diarrhea or constipation. Patient has been drinking alcohol 2-3 weeks with 3-4 beers and pint of liquor a day. Last drink was yesterday. Denies any h/o withdrawls. She took Ativan prior to arrival.   Review of Systems  Positive:  Negative:   Physical Exam  BP (!) 180/98 (BP Location: Right Arm)   Pulse 70   Temp 98 F (36.7 C) (Oral)   Resp 20   LMP 11/28/2022 (Exact Date)   SpO2 98%  Gen:   Awake, no distress   Resp:  Normal effort  MSK:   Moves extremities without difficulty  Other:  Upper abdominal pain. Soft. Palpable radial pulses. Dry mucous membranes.   Medical Decision Making  Medically screening exam initiated at 3:34 PM.  Appropriate orders placed.  Mushtaq Shramek was informed that the remainder of the evaluation will be completed by another provider, this initial triage assessment does not replace that evaluation, and the importance of remaining in the ED until their evaluation is complete.  History of prolonged QT other patient does not have a long QT here.  Will order Tigan and fentanyl for pain   Achille Rich, PA-C 04/04/23 1553    Achille Rich, [place] 04/04/23 [phone]

## 2023-04-04 NOTE — ED Provider Notes (Signed)
EMERGENCY DEPARTMENT AT [facility]  Hospital Provider Note   CSN: [id number] Arrival date & time: 04/04/23  1433     History  Chief Complaint  Patient presents with   Abdominal Pain    Jillian Shepherd is a 37 y.o. female.  The history is provided by the patient and medical records.  Abdominal Pain  37 year old female with history of alcohol abuse, depression, diabetes, hypertension, history of pancreatitis, presenting to the ED for abdominal pain.  States she began having epigastric abdominal pain, nausea, and vomiting at home today.  Pain does radiate somewhat into her back.  She has not had any fever or chills.  Does admit to alcohol use recently.  States she was sober for some time but has started drinking again because "life".  She estimates drinking 2-3 beers a day.  Home Medications Prior to Admission medications   Medication Sig Start Date End Date Taking? Authorizing Provider  amitriptyline (ELAVIL) 50 MG tablet Take 50 mg by mouth at bedtime as needed for sleep. 04/01/23  Yes [provider]  buPROPion (WELLBUTRIN XL) 150 MG 24 hr tablet Take 1 tablet (150 mg total) by mouth daily. 11/06/22  Yes [name], MD  cariprazine (VRAYLAR) 1.5 MG capsule Take 1 capsule (1.5 mg total) by mouth daily. 01/28/23  Yes [name], MD  cyanocobalamin (VITAMIN B12) 1000 MCG/ML injection Inject 1 mL (1,000 mcg total) into the muscle every 30 (thirty) days. 05/17/22  Yes [name], MD  KLOR-CON M20 20 MEQ tablet Take 20 mEq by mouth daily. 09/08/22  Yes [provider]  lipase/protease/amylase (CREON) 36000 UNITS CPEP capsule Take 2 capsules (72,000 Units total) by mouth 3 (three) times daily with meals. May also take 1 capsule (36,000 Units total) as needed (with snacks). 07/20/22  Yes Park Meo, FNP  ondansetron (ZOFRAN) 4 MG tablet Take 1 tablet (4 mg total) by mouth every 8 (eight) hours as needed for nausea or vomiting. 03/11/23  Yes  [name], MD  Oxycodone HCl 10 MG TABS Take 1 tablet (10 mg total) by mouth every 6 (six) hours as needed (breakthrough pain only). 04/01/23  Yes [name], MD  amLODipine (NORVASC) 10 MG tablet Take 1 tablet (10 mg total) by mouth daily. Patient not taking: Reported on 04/05/2023 04/10/22   Park Meo, FNP  folic acid (FOLVITE) 1 MG tablet Take 1 tablet (1 mg total) by mouth daily. Patient not taking: Reported on 04/05/2023 05/17/22 05/12/23  [name], MD  hydrOXYzine (VISTARIL) 25 MG capsule Take 1 capsule (25 mg total) by mouth every 8 (eight) hours as needed (for sleep). Patient not taking: Reported on 04/05/2023 08/30/22   [name], MD      Allergies    Patient has no known allergies.    Review of Systems   Review of Systems  Gastrointestinal:  Positive for abdominal pain.  All other systems reviewed and are negative.   Physical Exam Updated Vital Signs BP (!) 174/101   Pulse 77   Temp 98.6 F (37 C) (Oral)   Resp 15   LMP 11/28/2022 (Exact Date)   SpO2 94%  Physical Exam Vitals and nursing note reviewed.  Constitutional:      Appearance: She is well-developed.  HENT:     Head: Normocephalic and atraumatic.  Eyes:     Conjunctiva/sclera: Conjunctivae normal.     Pupils: Pupils are equal, round, and reactive to light.  Cardiovascular:  Rate and Rhythm: Normal rate and regular rhythm.     Heart sounds: Normal heart sounds.  Pulmonary:     Effort: Pulmonary effort is normal.     Breath sounds: Normal breath sounds.  Abdominal:     General: Bowel sounds are normal. There is distension.     Palpations: Abdomen is soft.     Tenderness: There is abdominal tenderness in the epigastric area. There is no rebound. Negative signs include McBurney's sign.  Musculoskeletal:        General: Normal range of motion.     Cervical back: Normal range of motion.  Skin:    General: Skin is warm and dry.  Neurological:     Mental Status: She is  alert and oriented to person, place, and time.     ED Results / Procedures / Treatments   Labs (all labs ordered are listed, but only abnormal results are displayed) Labs Reviewed  CBC WITH DIFFERENTIAL/PLATELET - Abnormal; Notable for the following components:      Result Value   WBC 14.4 (*)    RBC 5.54 (*)    HCT 46.3 (*)    RDW 16.8 (*)    Neutro Abs 12.0 (*)    All other components within normal limits  COMPREHENSIVE METABOLIC PANEL - Abnormal; Notable for the following components:   Glucose, Bld 131 (*)    Total Bilirubin 1.3 (*)    All other components within normal limits  LIPASE, BLOOD - Abnormal; Notable for the following components:   Lipase 281 (*)    All other components within normal limits  URINALYSIS, ROUTINE W REFLEX MICROSCOPIC - Abnormal; Notable for the following components:   Color, Urine AMBER (*)    APPearance CLOUDY (*)    Bilirubin Urine SMALL (*)    Ketones, ur 80 (*)    Protein, ur 100 (*)    Leukocytes,Ua TRACE (*)    Bacteria, UA MANY (*)    All other components within normal limits  HCG, QUANTITATIVE, PREGNANCY  PREGNANCY, URINE  ETHANOL  COMPREHENSIVE METABOLIC PANEL  CBC  LIPASE, BLOOD    EKG None  Radiology CT ABDOMEN PELVIS W CONTRAST  Result Date: 04/05/2023 CLINICAL DATA:  Pancreatitis, acute, severe Abdominal pain, acute, nonlocalized. Pt reports she is having a flare of her pancreatitis. Pain 10/10/ Vomiting. EXAM: CT ABDOMEN AND PELVIS WITH CONTRAST TECHNIQUE: Multidetector CT imaging of the abdomen and pelvis was performed using the standard protocol following bolus administration of intravenous contrast. RADIATION DOSE REDUCTION: This exam was performed according to the departmental dose-optimization program which includes automated exposure control, adjustment of the mA and/or kV according to patient size and/or use of iterative reconstruction technique. CONTRAST:  75mL OMNIPAQUE IOHEXOL 350 MG/ML SOLN COMPARISON:  CT abdomen  pelvis 08/15/2022 FINDINGS: Lower chest: No acute abnormality. Hepatobiliary: Pancreas: Peripancreatic free fluid and fat stranding. Pancreatic contour hazzy. Stable 2.4 x 3.2 cm mid pancreatic body pseudocyst. Interval development of slightly more distal 1.6 cm pseudocyst. No main pancreatic ductal dilatation. Spleen: Persistent slightly smaller 11.3 x 7.6 cm (from 12.5 x 8.7 cm) subcapsular splenic hematoma. No new focal lesion. Adrenals/Urinary Tract: No adrenal nodule bilaterally. Bilateral kidneys enhance symmetrically. No hydronephrosis. No hydroureter. The urinary bladder is unremarkable. Stomach/Bowel: Stomach is within normal limits. No evidence of bowel wall thickening or dilatation. Appendix appears normal. Vascular/Lymphatic: Venous collaterals along the expected main portal vein with previously identified portal vein thrombus not visualized. No abdominal aorta or iliac aneurysm. Mild atherosclerotic plaque of  the aorta and its branches. No abdominal, pelvic, or inguinal lymphadenopathy. Reproductive: Uterus and bilateral adnexa are unremarkable. Other: No intraperitoneal free fluid. No intraperitoneal free gas. No organized fluid collection. Musculoskeletal: No abdominal wall hernia or abnormality. No suspicious lytic or blastic osseous lesions. No acute displaced fracture. IMPRESSION: 1. Acute pancreatitis with couple pseudocyst as described above. 2. Persistent slightly smaller 11.3 x 7.6 cm (from 12.5 x 8.7 cm) subcapsular splenic hematoma. 3. Cavernous transformation of the portal vein. Electronically Signed   By: Tish Frederickson M.D.   On: 04/05/2023 00:59    Procedures Procedures    Medications Ordered in ED Medications  LORazepam (ATIVAN) tablet 1-4 mg (has no administration in time range)    Or  LORazepam (ATIVAN) injection 1-4 mg (has no administration in time range)  LORazepam (ATIVAN) injection 1 mg (1 mg Intramuscular Not Given 04/05/23 0006)  amLODipine (NORVASC) tablet 10 mg  (has no administration in time range)  buPROPion (WELLBUTRIN XL) 24 hr tablet 150 mg (has no administration in time range)  cariprazine (VRAYLAR) capsule 1.5 mg (has no administration in time range)  hydrOXYzine (ATARAX) tablet 25 mg (has no administration in time range)  lactated ringers infusion (has no administration in time range)  sodium chloride flush (NS) 0.9 % injection 3 mL (has no administration in time range)  sodium chloride flush (NS) 0.9 % injection 3 mL (has no administration in time range)  0.9 %  sodium chloride infusion (has no administration in time range)  acetaminophen (TYLENOL) tablet 650 mg (has no administration in time range)    Or  acetaminophen (TYLENOL) suppository 650 mg (has no administration in time range)  morphine (PF) 2 MG/ML injection 2 mg (2 mg Intravenous Given 04/05/23 0320)  HYDROmorphone (DILAUDID) injection 1 mg (has no administration in time range)  senna-docusate (Senokot-S) tablet 1 tablet (has no administration in time range)  hydrALAZINE (APRESOLINE) injection 10 mg (10 mg Intravenous Given 04/05/23 0320)  folic acid (FOLVITE) tablet 1 mg (has no administration in time range)  multivitamin with minerals tablet 1 tablet (has no administration in time range)  thiamine (VITAMIN B1) tablet 100 mg (has no administration in time range)    Or  thiamine (VITAMIN B1) injection 100 mg (has no administration in time range)  fentaNYL (SUBLIMAZE) injection 50 mcg (50 mcg Intramuscular Given 04/04/23 1648)  oxyCODONE (Oxy IR/ROXICODONE) immediate release tablet 5 mg (5 mg Oral Given 04/04/23 1727)  HYDROmorphone (DILAUDID) injection 1 mg (1 mg Intravenous Given 04/05/23 0005)  sodium chloride 0.9 % bolus 1,000 mL (0 mLs Intravenous Stopped 04/05/23 0200)  metoCLOPramide (REGLAN) injection 10 mg (10 mg Intravenous Given 04/05/23 0011)  iohexol (OMNIPAQUE) 350 MG/ML injection 75 mL (75 mLs Intravenous Contrast Given 04/05/23 0043)  morphine (PF) 4 MG/ML injection 4 mg  (4 mg Intravenous Given 04/05/23 0104)    ED Course/ Medical Decision Making/ A&P                                 Medical Decision Making Amount and/or Complexity of Data Reviewed Labs: ordered. Radiology: ordered and independent interpretation performed. ECG/medicine tests: ordered and independent interpretation performed.  Risk Prescription drug management. Decision regarding hospitalization.   34 female presenting to the ED with epigastric abdominal pain.  Has history of alcohol induced pancreatitis.  Was sober for a while but recently started drinking again.  She is afebrile and nontoxic in appearance here.  She  does have tenderness to the epigastrium with voluntary guarding.  No peritoneal signs.  She has had some nausea and vomiting.  Labs as above--does have leukocytosis, no significant electrolyte derangement.  Lipase is 281.  CT with findings of acute pancreatitis, does have some chronic appearing pseudocyst but new cyst present in tail of the pancreas.  No acute necrosis noted.  Patient has had some recurrent vomiting here in the ED, resolved with Reglan.  She has received IV fluids and pain medications.  Ethanol today is negative, no acute signs of withdrawal here in the ED.  She is placed on CIWA protocol.  She will require admission for ongoing management.  Case discussed with hospitalist, Dr. Janalyn Shy-- will admit for ongoing care.  Final Clinical Impression(s) / ED Diagnoses Final diagnoses:  Alcohol-induced acute pancreatitis, unspecified complication status  Pseudocyst of pancreas due to acute pancreatitis    Rx / DC Orders ED Discharge Orders     None         Garlon Hatchet, PA-C 04/05/23 [id number]    Palumbo, April, MD 04/05/23 [phone]

## 2023-04-04 NOTE — Social Work (Signed)
CSW spoke to patient about SUD resources at this time patient declined. At this time there are no TOC needs. TOC will sign off.

## 2023-04-05 ENCOUNTER — Encounter (HOSPITAL_COMMUNITY): Payer: Self-pay | Admitting: Internal Medicine

## 2023-04-05 ENCOUNTER — Emergency Department (HOSPITAL_COMMUNITY): Payer: 59

## 2023-04-05 DIAGNOSIS — Z833 Family history of diabetes mellitus: Secondary | ICD-10-CM | POA: Diagnosis not present

## 2023-04-05 DIAGNOSIS — K219 Gastro-esophageal reflux disease without esophagitis: Secondary | ICD-10-CM | POA: Diagnosis present

## 2023-04-05 DIAGNOSIS — F1090 Alcohol use, unspecified, uncomplicated: Secondary | ICD-10-CM | POA: Diagnosis not present

## 2023-04-05 DIAGNOSIS — D735 Infarction of spleen: Secondary | ICD-10-CM | POA: Diagnosis present

## 2023-04-05 DIAGNOSIS — Z85828 Personal history of other malignant neoplasm of skin: Secondary | ICD-10-CM | POA: Diagnosis not present

## 2023-04-05 DIAGNOSIS — E871 Hypo-osmolality and hyponatremia: Secondary | ICD-10-CM | POA: Diagnosis present

## 2023-04-05 DIAGNOSIS — E669 Obesity, unspecified: Secondary | ICD-10-CM | POA: Diagnosis present

## 2023-04-05 DIAGNOSIS — K852 Alcohol induced acute pancreatitis without necrosis or infection: Secondary | ICD-10-CM | POA: Diagnosis present

## 2023-04-05 DIAGNOSIS — E876 Hypokalemia: Secondary | ICD-10-CM | POA: Diagnosis present

## 2023-04-05 DIAGNOSIS — K860 Alcohol-induced chronic pancreatitis: Secondary | ICD-10-CM | POA: Diagnosis present

## 2023-04-05 DIAGNOSIS — F411 Generalized anxiety disorder: Secondary | ICD-10-CM | POA: Diagnosis present

## 2023-04-05 DIAGNOSIS — K859 Acute pancreatitis without necrosis or infection, unspecified: Secondary | ICD-10-CM | POA: Diagnosis not present

## 2023-04-05 DIAGNOSIS — E538 Deficiency of other specified B group vitamins: Secondary | ICD-10-CM | POA: Diagnosis present

## 2023-04-05 DIAGNOSIS — I81 Portal vein thrombosis: Secondary | ICD-10-CM | POA: Diagnosis present

## 2023-04-05 DIAGNOSIS — Z6830 Body mass index (BMI) 30.0-30.9, adult: Secondary | ICD-10-CM | POA: Diagnosis not present

## 2023-04-05 DIAGNOSIS — Z808 Family history of malignant neoplasm of other organs or systems: Secondary | ICD-10-CM | POA: Diagnosis not present

## 2023-04-05 DIAGNOSIS — I10 Essential (primary) hypertension: Secondary | ICD-10-CM | POA: Diagnosis present

## 2023-04-05 DIAGNOSIS — S36029A Unspecified contusion of spleen, initial encounter: Secondary | ICD-10-CM

## 2023-04-05 DIAGNOSIS — Z818 Family history of other mental and behavioral disorders: Secondary | ICD-10-CM | POA: Diagnosis not present

## 2023-04-05 DIAGNOSIS — R748 Abnormal levels of other serum enzymes: Secondary | ICD-10-CM | POA: Insufficient documentation

## 2023-04-05 DIAGNOSIS — E119 Type 2 diabetes mellitus without complications: Secondary | ICD-10-CM | POA: Diagnosis present

## 2023-04-05 DIAGNOSIS — F17210 Nicotine dependence, cigarettes, uncomplicated: Secondary | ICD-10-CM | POA: Diagnosis present

## 2023-04-05 DIAGNOSIS — K863 Pseudocyst of pancreas: Secondary | ICD-10-CM | POA: Diagnosis present

## 2023-04-05 DIAGNOSIS — Z8249 Family history of ischemic heart disease and other diseases of the circulatory system: Secondary | ICD-10-CM | POA: Diagnosis not present

## 2023-04-05 DIAGNOSIS — Z79899 Other long term (current) drug therapy: Secondary | ICD-10-CM | POA: Diagnosis not present

## 2023-04-05 DIAGNOSIS — F1020 Alcohol dependence, uncomplicated: Secondary | ICD-10-CM | POA: Diagnosis present

## 2023-04-05 LAB — CBC
HCT: 43.1 % (ref 36.0–46.0)
Hemoglobin: 14.4 g/dL (ref 12.0–15.0)
MCH: 28.2 pg (ref 26.0–34.0)
MCHC: 33.4 g/dL (ref 30.0–36.0)
MCV: 84.3 fL (ref 80.0–100.0)
Platelets: 209 10*3/uL (ref 150–400)
RBC: 5.11 MIL/uL (ref 3.87–5.11)
RDW: 16.8 % — ABNORMAL HIGH (ref 11.5–15.5)
WBC: 16.1 10*3/uL — ABNORMAL HIGH (ref 4.0–10.5)
nRBC: 0 % (ref 0.0–0.2)

## 2023-04-05 LAB — LIPID PANEL
Cholesterol: 157 mg/dL (ref 0–200)
HDL: 47 mg/dL (ref >40)
LDL Cholesterol: 94 mg/dL (ref 0–99)
Total CHOL/HDL Ratio: 3.3 RATIO
Triglycerides: 78 mg/dL (ref <150)
VLDL: 16 mg/dL (ref 0–40)

## 2023-04-05 LAB — TRIGLYCERIDES: Triglycerides: 74 mg/dL (ref <150)

## 2023-04-05 LAB — COMPREHENSIVE METABOLIC PANEL
ALT: 14 U/L (ref 0–44)
AST: 23 U/L (ref 15–41)
Albumin: 3.5 g/dL (ref 3.5–5.0)
Alkaline Phosphatase: 99 U/L (ref 38–126)
Anion gap: 15 (ref 5–15)
BUN: 11 mg/dL (ref 6–20)
CO2: 21 mmol/L — ABNORMAL LOW (ref 22–32)
Calcium: 9 mg/dL (ref 8.9–10.3)
Chloride: 100 mmol/L (ref 98–111)
Creatinine, Ser: 0.54 mg/dL (ref 0.44–1.00)
GFR, Estimated: >60 mL/min (ref >60)
Glucose, Bld: 142 mg/dL — ABNORMAL HIGH (ref 70–99)
Potassium: 4.4 mmol/L (ref 3.5–5.1)
Sodium: 136 mmol/L (ref 135–145)
Total Bilirubin: 1.7 mg/dL — ABNORMAL HIGH (ref 0.3–1.2)
Total Protein: 7.3 g/dL (ref 6.5–8.1)

## 2023-04-05 LAB — ETHANOL: Alcohol, Ethyl (B): <10 mg/dL (ref <10)

## 2023-04-05 LAB — LIPASE, BLOOD: Lipase: 288 U/L — ABNORMAL HIGH (ref 11–51)

## 2023-04-05 MED ORDER — ACETAMINOPHEN 325 MG PO TABS: 650.0000 mg | ORAL_TABLET | Freq: Four times a day (QID) | ORAL | Status: DC | PRN

## 2023-04-05 MED ORDER — LACTATED RINGERS IV SOLN: INTRAVENOUS | Status: AC

## 2023-04-05 MED ORDER — HYDROMORPHONE HCL 1 MG/ML IJ SOLN
1.0000 mg | INTRAMUSCULAR | Status: DC | PRN
  Administered 2023-04-05 – 2023-04-08 (×15): 1 mg via INTRAVENOUS
  Filled 2023-04-05 (×15): qty 1

## 2023-04-05 MED ORDER — MORPHINE SULFATE (PF) 2 MG/ML IV SOLN
2.0000 mg | INTRAVENOUS | Status: DC | PRN
  Administered 2023-04-05 – 2023-04-07 (×12): 2 mg via INTRAVENOUS
  Filled 2023-04-05 (×12): qty 1

## 2023-04-05 MED ORDER — BUPROPION HCL ER (XL) 150 MG PO TB24
150.0000 mg | ORAL_TABLET | Freq: Every day | ORAL | Status: DC
  Administered 2023-04-05 – 2023-04-08 (×4): 150 mg via ORAL
  Filled 2023-04-05 (×4): qty 1

## 2023-04-05 MED ORDER — FOLIC ACID 1 MG PO TABS
1.0000 mg | ORAL_TABLET | Freq: Every day | ORAL | Status: DC
  Administered 2023-04-06 – 2023-04-08 (×3): 1 mg via ORAL
  Filled 2023-04-05 (×3): qty 1

## 2023-04-05 MED ORDER — THIAMINE HCL 100 MG/ML IJ SOLN
100.0000 mg | Freq: Every day | INTRAMUSCULAR | Status: DC
  Administered 2023-04-06: 100 mg via INTRAVENOUS
  Filled 2023-04-05 (×2): qty 2

## 2023-04-05 MED ORDER — HYDRALAZINE HCL 20 MG/ML IJ SOLN
10.0000 mg | Freq: Three times a day (TID) | INTRAMUSCULAR | Status: DC | PRN
  Administered 2023-04-05 – 2023-04-06 (×2): 10 mg via INTRAVENOUS
  Filled 2023-04-05 (×2): qty 1

## 2023-04-05 MED ORDER — HYDROXYZINE HCL 25 MG PO TABS
25.0000 mg | ORAL_TABLET | Freq: Three times a day (TID) | ORAL | Status: DC | PRN
  Administered 2023-04-06 – 2023-04-08 (×2): 25 mg via ORAL
  Filled 2023-04-05 (×2): qty 1

## 2023-04-05 MED ORDER — IOHEXOL 350 MG/ML SOLN
75.0000 mL | Freq: Once | INTRAVENOUS | Status: AC | PRN
  Administered 2023-04-05: 75 mL via INTRAVENOUS

## 2023-04-05 MED ORDER — SENNOSIDES-DOCUSATE SODIUM 8.6-50 MG PO TABS: 1.0000 | ORAL_TABLET | Freq: Every evening | ORAL | Status: DC | PRN

## 2023-04-05 MED ORDER — ACETAMINOPHEN 650 MG RE SUPP: 650.0000 mg | Freq: Four times a day (QID) | RECTAL | Status: DC | PRN

## 2023-04-05 MED ORDER — SODIUM CHLORIDE 0.9 % IV SOLN: 250.0000 mL | INTRAVENOUS | Status: DC | PRN

## 2023-04-05 MED ORDER — AMLODIPINE BESYLATE 10 MG PO TABS
10.0000 mg | ORAL_TABLET | Freq: Every day | ORAL | Status: DC
  Administered 2023-04-05 – 2023-04-08 (×4): 10 mg via ORAL
  Filled 2023-04-05 (×4): qty 1

## 2023-04-05 MED ORDER — SODIUM CHLORIDE 0.9% FLUSH: 3.0000 mL | INTRAVENOUS | Status: DC | PRN

## 2023-04-05 MED ORDER — CARIPRAZINE HCL 1.5 MG PO CAPS
1.5000 mg | ORAL_CAPSULE | Freq: Every day | ORAL | Status: DC
  Administered 2023-04-05 – 2023-04-07 (×3): 1.5 mg via ORAL
  Filled 2023-04-05 (×4): qty 1

## 2023-04-05 MED ORDER — SODIUM CHLORIDE 0.9% FLUSH
3.0000 mL | Freq: Two times a day (BID) | INTRAVENOUS | Status: DC
  Administered 2023-04-05 (×2): 3 mL via INTRAVENOUS

## 2023-04-05 MED ORDER — ADULT MULTIVITAMIN W/MINERALS CH
1.0000 | ORAL_TABLET | Freq: Every day | ORAL | Status: DC
  Administered 2023-04-06 – 2023-04-08 (×3): 1 via ORAL
  Filled 2023-04-05 (×3): qty 1

## 2023-04-05 MED ORDER — THIAMINE MONONITRATE 100 MG PO TABS
100.0000 mg | ORAL_TABLET | Freq: Every day | ORAL | Status: DC
  Administered 2023-04-07 – 2023-04-08 (×2): 100 mg via ORAL
  Filled 2023-04-05 (×3): qty 1

## 2023-04-05 MED ORDER — MORPHINE SULFATE (PF) 4 MG/ML IV SOLN
4.0000 mg | Freq: Once | INTRAVENOUS | Status: AC
  Administered 2023-04-05: 4 mg via INTRAVENOUS
  Filled 2023-04-05: qty 1

## 2023-04-05 NOTE — ED Notes (Signed)
ED TO INPATIENT HANDOFF REPORT  ED Nurse Name and Phone #: Kathryne Hitch [phone]  S Name/Age/Gender Jillian Shepherd 37 y.o. female Room/Bed: 027C/027C  Code Status   Code Status: Full Code  Home/SNF/Other Home Patient oriented to: self, place, time, and situation Is this baseline? Yes   Triage Complete: Triage complete  Chief Complaint Acute alcoholic pancreatitis [K85.20]  Triage Note Pt reports she is having a flare of her pancreatitis.  Pain 10/10/  Vomiting.  Diaphoretic in triage.  Unable to get into a position of comfort.    Allergies No Known Allergies  Level of Care/Admitting Diagnosis ED Disposition     ED Disposition  Admit   Condition  --   Comment  Hospital Area: [facility] [[id number]]  Level of Care: Med-Surg [16]  May admit patient to [facility] or [facility] if equivalent level of care is available:: No  Covid Evaluation: Asymptomatic - no recent exposure (last 10 days) testing not required  Diagnosis: Acute alcoholic pancreatitis [784696]  Admitting Physician: Tereasa Coop [[id number]]  Attending Physician: Tereasa Coop [[id number]]  Certification:: I certify this patient will need inpatient services for at least 2 midnights  Expected Medical Readiness: 04/09/2023          B Medical/Surgery History Past Medical History:  Diagnosis Date   Abdominal pain    Alcohol abuse    B12 deficiency    Cancer (HCC)    squmaous cell skin cancer    Collagen vascular disease (HCC)    Hypokalemia    Liddle's syndrome    Obesity (BMI 35.0-39.9 without comorbidity)    Pancreatitis    Pleural effusion    Splenic hemorrhage    Past Surgical History:  Procedure Laterality Date   ADENOIDECTOMY     CHEST TUBE INSERTION     COLONOSCOPY WITH PROPOFOL N/A 10/06/2020   Procedure: COLONOSCOPY WITH PROPOFOL;  Surgeon: Toney Reil, MD;  Location: [facility] ENDOSCOPY;  Service: Gastroenterology;  Laterality: N/A;  COVID POSITIVE 08/23/2020    ESOPHAGOGASTRODUODENOSCOPY (EGD) WITH PROPOFOL N/A 10/06/2020   Procedure: ESOPHAGOGASTRODUODENOSCOPY (EGD) WITH PROPOFOL;  Surgeon: Toney Reil, MD;  Location: [facility] ENDOSCOPY;  Service: Gastroenterology;  Laterality: N/A;   skin cancer removal Left    leg   TONSILLECTOMY       A IV Location/Drains/Wounds Patient Lines/Drains/Airways Status     Active Line/Drains/Airways     Name Placement date Placement time Site Days   Peripheral IV 04/05/23 20 G Anterior;Proximal;Right Forearm 04/05/23  0004  Forearm  less than 1            Intake/Output Last 24 hours No intake or output data in the 24 hours ending 04/05/23 0228  Labs/Imaging Results for orders placed or performed during the hospital encounter of 04/04/23 (from the past 48 hour(s))  Urinalysis, Routine w reflex microscopic -Urine, Clean Catch     Status: Abnormal   Collection Time: 04/04/23  3:35 PM  Result Value Ref Range   Color, Urine AMBER (A) YELLOW    Comment: BIOCHEMICALS MAY BE AFFECTED BY COLOR   APPearance CLOUDY (A) CLEAR   Specific Gravity, Urine 1.021 1.005 - 1.030   pH 6.0 5.0 - 8.0   Glucose, UA NEGATIVE NEGATIVE mg/dL   Hgb urine dipstick NEGATIVE NEGATIVE   Bilirubin Urine SMALL (A) NEGATIVE   Ketones, ur 80 (A) NEGATIVE mg/dL   Protein, ur 272 (A) NEGATIVE mg/dL   Nitrite NEGATIVE NEGATIVE   Leukocytes,Ua TRACE (A) NEGATIVE   RBC /  HPF 0-5 0 - 5 RBC/hpf   WBC, UA 11-20 0 - 5 WBC/hpf   Bacteria, UA MANY (A) NONE SEEN   Squamous Epithelial / HPF 6-10 0 - 5 /HPF   Mucus PRESENT     Comment: Performed at [facility], [address] [place], [place] [address]  Pregnancy, urine     Status: None   Collection Time: 04/04/23  3:35 PM  Result Value Ref Range   Preg Test, Ur NEGATIVE NEGATIVE    Comment: Performed at [facility], [address], [place], [place] [address]  CBC with Differential     Status: Abnormal   Collection Time: 04/04/23  3:42 PM  Result Value Ref Range    WBC 14.4 (H) 4.0 - 10.5 K/uL   RBC 5.54 (H) 3.87 - 5.11 MIL/uL   Hemoglobin 15.0 12.0 - 15.0 g/dL   HCT 91.4 (H) 78.2 - 95.6 %   MCV 83.6 80.0 - 100.0 fL   MCH 27.1 26.0 - 34.0 pg   MCHC 32.4 30.0 - 36.0 g/dL   RDW 21.3 (H) 08.6 - 57.8 %   Platelets 303 150 - 400 K/uL   nRBC 0.0 0.0 - 0.2 %   Neutrophils Relative % 83 %   Neutro Abs 12.0 (H) 1.7 - 7.7 K/uL   Lymphocytes Relative 13 %   Lymphs Abs 1.8 0.7 - 4.0 K/uL   Monocytes Relative 4 %   Monocytes Absolute 0.5 0.1 - 1.0 K/uL   Eosinophils Relative 0 %   Eosinophils Absolute 0.0 0.0 - 0.5 K/uL   Basophils Relative 0 %   Basophils Absolute 0.0 0.0 - 0.1 K/uL   Immature Granulocytes 0 %   Abs Immature Granulocytes 0.04 0.00 - 0.07 K/uL    Comment: Performed at [facility], [address] [place], [place] [address]  Comprehensive metabolic panel     Status: Abnormal   Collection Time: 04/04/23  3:42 PM  Result Value Ref Range   Sodium 136 135 - 145 mmol/L   Potassium 3.6 3.5 - 5.1 mmol/L   Chloride 99 98 - 111 mmol/L   CO2 25 22 - 32 mmol/L   Glucose, Bld 131 (H) 70 - 99 mg/dL    Comment: Glucose reference range applies only to samples taken after fasting for at least 8 hours.   BUN 11 6 - 20 mg/dL   Creatinine, Ser 9.52 0.44 - 1.00 mg/dL   Calcium 9.2 8.9 - 84.1 mg/dL   Total Protein 8.0 6.5 - 8.1 g/dL   Albumin 4.0 3.5 - 5.0 g/dL   AST 22 15 - 41 U/L   ALT 14 0 - 44 U/L   Alkaline Phosphatase 117 38 - 126 U/L   Total Bilirubin 1.3 (H) 0.3 - 1.2 mg/dL   GFR, Estimated >32 >44 mL/min    Comment: (NOTE) Calculated using the CKD-EPI Creatinine Equation (2021)    Anion gap 12 5 - 15    Comment: Performed at [facility], [address] [place], [place] [address]  Lipase, blood     Status: Abnormal   Collection Time: 04/04/23  3:42 PM  Result Value Ref Range   Lipase 281 (H) 11 - 51 U/L    Comment: Performed at [facility], [address], [place], [place] [address]  hCG, quantitative, pregnancy      Status: None   Collection Time: 04/04/23  3:42 PM  Result Value Ref Range  hCG, Beta Chain, Quant, S 1 <5 mIU/mL    Comment:          GEST. AGE      CONC.  (mIU/mL)   <=1 WEEK        5 - 50     2 WEEKS       50 - 500     3 WEEKS       100 - 10,000     4 WEEKS     1,000 - 30,000     5 WEEKS     3,500 - 115,000   6-8 WEEKS     12,000 - 270,000    12 WEEKS     15,000 - 220,000        FEMALE AND NON-PREGNANT FEMALE:     LESS THAN 5 mIU/mL Performed at [facility], [address] [place], [place] [address]   Ethanol     Status: None   Collection Time: 04/05/23 12:10 AM  Result Value Ref Range   Alcohol, Ethyl (B) <10 <10 mg/dL    Comment: (NOTE) Lowest detectable limit for serum alcohol is 10 mg/dL.  For medical purposes only. Performed at [facility], [address], [place], [place] [address]    CT ABDOMEN PELVIS W CONTRAST  Result Date: 04/05/2023 CLINICAL DATA:  Pancreatitis, acute, severe Abdominal pain, acute, nonlocalized. Pt reports she is having a flare of her pancreatitis. Pain 10/10/ Vomiting. EXAM: CT ABDOMEN AND PELVIS WITH CONTRAST TECHNIQUE: Multidetector CT imaging of the abdomen and pelvis was performed using the standard protocol following bolus administration of intravenous contrast. RADIATION DOSE REDUCTION: This exam was performed according to the departmental dose-optimization program which includes automated exposure control, adjustment of the mA and/or kV according to patient size and/or use of iterative reconstruction technique. CONTRAST:  75mL OMNIPAQUE IOHEXOL 350 MG/ML SOLN COMPARISON:  CT abdomen pelvis 08/15/2022 FINDINGS: Lower chest: No acute abnormality. Hepatobiliary: Pancreas: Peripancreatic free fluid and fat stranding. Pancreatic contour hazzy. Stable 2.4 x 3.2 cm mid pancreatic body pseudocyst. Interval development of slightly more distal 1.6 cm pseudocyst. No main pancreatic ductal dilatation. Spleen: Persistent slightly smaller 11.3 x  7.6 cm (from 12.5 x 8.7 cm) subcapsular splenic hematoma. No new focal lesion. Adrenals/Urinary Tract: No adrenal nodule bilaterally. Bilateral kidneys enhance symmetrically. No hydronephrosis. No hydroureter. The urinary bladder is unremarkable. Stomach/Bowel: Stomach is within normal limits. No evidence of bowel wall thickening or dilatation. Appendix appears normal. Vascular/Lymphatic: Venous collaterals along the expected main portal vein with previously identified portal vein thrombus not visualized. No abdominal aorta or iliac aneurysm. Mild atherosclerotic plaque of the aorta and its branches. No abdominal, pelvic, or inguinal lymphadenopathy. Reproductive: Uterus and bilateral adnexa are unremarkable. Other: No intraperitoneal free fluid. No intraperitoneal free gas. No organized fluid collection. Musculoskeletal: No abdominal wall hernia or abnormality. No suspicious lytic or blastic osseous lesions. No acute displaced fracture. IMPRESSION: 1. Acute pancreatitis with couple pseudocyst as described above. 2. Persistent slightly smaller 11.3 x 7.6 cm (from 12.5 x 8.7 cm) subcapsular splenic hematoma. 3. Cavernous transformation of the portal vein. Electronically Signed   By: Tish Frederickson M.D.   On: 04/05/2023 00:59    Pending Labs Unresulted Labs (From admission, onward)     Start     Ordered   04/05/23 0500  Comprehensive metabolic panel  Tomorrow morning,   R        04/05/23 0155   04/05/23 0500  CBC  Tomorrow morning,  R        04/05/23 0155   04/05/23 0500  Lipase, blood  Tomorrow morning,   R        04/05/23 0155            Vitals/Pain Today's Vitals   04/05/23 0057 04/05/23 0100 04/05/23 0130 04/05/23 0200  BP: (!) 172/97 (!) 187/94 (!) 160/96 (!) 163/93  Pulse: 76 67 86 88  Resp: 19 16 17 15   Temp:      TempSrc:      SpO2: 94% 94% 93% 96%  PainSc:        Isolation Precautions No active isolations  Medications Medications  LORazepam (ATIVAN) tablet 1-4 mg (has no  administration in time range)    Or  LORazepam (ATIVAN) injection 1-4 mg (has no administration in time range)  thiamine (VITAMIN B1) tablet 100 mg (100 mg Oral Given 04/05/23 0006)    Or  thiamine (VITAMIN B1) injection 100 mg ( Intravenous See Alternative 04/05/23 0006)  folic acid (FOLVITE) tablet 1 mg (1 mg Oral Given 04/05/23 0006)  multivitamin with minerals tablet 1 tablet (1 tablet Oral Given 04/05/23 0006)  LORazepam (ATIVAN) injection 1 mg (1 mg Intramuscular Not Given 04/05/23 0006)  amLODipine (NORVASC) tablet 10 mg (has no administration in time range)  buPROPion (WELLBUTRIN XL) 24 hr tablet 150 mg (has no administration in time range)  cariprazine (VRAYLAR) capsule 1.5 mg (has no administration in time range)  hydrOXYzine (ATARAX) tablet 25 mg (has no administration in time range)  lactated ringers infusion (has no administration in time range)  sodium chloride flush (NS) 0.9 % injection 3 mL (has no administration in time range)  sodium chloride flush (NS) 0.9 % injection 3 mL (has no administration in time range)  0.9 %  sodium chloride infusion (has no administration in time range)  acetaminophen (TYLENOL) tablet 650 mg (has no administration in time range)    Or  acetaminophen (TYLENOL) suppository 650 mg (has no administration in time range)  morphine (PF) 2 MG/ML injection 2 mg (has no administration in time range)  HYDROmorphone (DILAUDID) injection 1 mg (has no administration in time range)  senna-docusate (Senokot-S) tablet 1 tablet (has no administration in time range)  hydrALAZINE (APRESOLINE) injection 10 mg (has no administration in time range)  fentaNYL (SUBLIMAZE) injection 50 mcg (50 mcg Intramuscular Given 04/04/23 1648)  oxyCODONE (Oxy IR/ROXICODONE) immediate release tablet 5 mg (5 mg Oral Given 04/04/23 1727)  HYDROmorphone (DILAUDID) injection 1 mg (1 mg Intravenous Given 04/05/23 0005)  sodium chloride 0.9 % bolus 1,000 mL (0 mLs Intravenous Stopped 04/05/23  0200)  metoCLOPramide (REGLAN) injection 10 mg (10 mg Intravenous Given 04/05/23 0011)  iohexol (OMNIPAQUE) 350 MG/ML injection 75 mL (75 mLs Intravenous Contrast Given 04/05/23 0043)  morphine (PF) 4 MG/ML injection 4 mg (4 mg Intravenous Given 04/05/23 0104)    Mobility walks     Focused Assessments     R Recommendations: See Admitting Provider Note  Report given to:   Additional Notes:

## 2023-04-05 NOTE — ED Notes (Signed)
Back from MRI.

## 2023-04-05 NOTE — Progress Notes (Signed)
Progress Note   Patient: Jillian Shepherd EAV:[id number] DOB: 02-Sep-1985 DOA: 04/04/2023     0 DOS: the patient was seen and examined on 04/05/2023    Subjective:  Patient seen and examined at bedside this morning She tells me her abdominal pain is improving According to her she did not wish to use alcohol again however she had a family event and subsequently used alcohol She denied nausea vomiting chest pain or cough She is willing to give a trial to clear liquid diet today I have discussed the case with gastroenterology team   Brief hospital course: From HPI "Jillian Shepherd is a 37 y.o. female with medical history significant of history of chronic alcohol use complicated by alcoholic pancreatitis with necrosis and pseudocyst, known occlusive thrombosis of the portal vein/splenic vein, chronic hematuria, essential hypertension and GERD presented to emergency department complaining of pancreatitis flare, nausea and vomiting.   CT abdomen pelvis showing acute pancreatitis with couple pseudocyst, persistent slightly smaller subcapsular splenic hematoma and cavernous transformation of the portal vein.   In the ED patient has been giving fentanyl 50 mcg once, Dilaudid 1 mg once and patient being started on CIWA protocol with Ativan as needed.  Hospitalist has been consulted for admission management of acute alcoholic pancreatitis and acute alcohol withdrawal. "  Assessment and Plan:  Acute alcoholic pancreatitis History of chronic pancreatitis History of splenic hematoma-size decreasing History of pancreatic pseudocyst Patient presented with mid epigastric pain in the setting of elevated lipase level after recent binge alcohol use  Found to have elevated lipase 281.  CBC showing leukocytosis in 14.4.  CMP grossly unremarkable no evidence of transaminitis. The scan of the abdomen and pelvis showing findings of acute pancreatitis with pseudocysts and a newly formed pseudocyst involving the  distal pancreas Gastroenterology is on board and case discussed We will continue current IV fluid Continue as needed pain medication Continue as needed Zofran We will advance diet as tolerated, n.p.o. has been advanced to clear liquid diet today   Leukocytosis likely reactive in the setting of acute pancreatitis We will continue to monitor CBC closely  Chronic alcohol use Continue multivitamin, thiamine and folic acid daily. Continue with CIWA protocol Have counseled patient concerning alcohol cessation and she expressed understanding   Essential hypertension Continue amlodipine Monitor blood pressure closely   General anxiety disorder Resumed home BuSpar on 50 mg daily, Atarax as needed.    DVT prophylaxis:  SCDs.  Holding pharmacological prophylaxis due to splenic hematoma.  Code Status:  Full Code   Family Communication: Discussed treatment plan with patient.   Disposition Plan: Pending clinical improvement.  Currently advancing diet with GI inputs  Consults: Gastroenterology  Admission status:   Inpatient, Med-Surg    Physical Exam:    Mouth/Throat:     Mouth: Mucous membranes are moist.  Cardiovascular:     Rate and Rhythm: Normal rate and regular rhythm.     Heart sounds: Normal heart sounds.  Pulmonary:     Effort: Pulmonary effort is normal.  Abdominal: Mid abdominal tenderness as well as epigastric tenderness no guarding    Hernia: No hernia is present.  Skin:    Capillary Refill: Capillary refill takes less than 2 seconds.  Neurological:     Mental Status: She is alert and oriented to person, place, and time.     Vitals:   04/05/23 0320 04/05/23 0334 04/05/23 0750 04/05/23 1134  BP: (!) 174/101 (!) 168/96 (!) 153/87 (!) 170/105  Pulse:  99 93 87  Resp:  18 16 18   Temp:  98.1 F (36.7 C) 98.1 F (36.7 C) 98.1 F (36.7 C)  TempSrc:  Oral Oral Oral  SpO2:  95% 94% 96%  Weight:  76.5 kg    Height:  5\' 4"  (1.626 m)      Data Reviewed: I have  reviewed patient's CT scan of the abdomen showing findings of acute pancreatitis as well as pancreatic pseudocyst as documented above, I reviewed patient's previous documentation as well as gastroenterology documentation, I have also reviewed patient's labs and vitals as well as CBC and BMP as documented below    Latest Ref Rng & Units 04/05/2023    3:43 AM 04/04/2023    3:42 PM 12/22/2022   12:08 AM  CMP  Glucose 70 - 99 mg/dL 366  440  347   BUN 6 - 20 mg/dL 11  11  8    Creatinine 0.44 - 1.00 mg/dL 4.25  9.56  3.87   Sodium 135 - 145 mmol/L 136  136  139   Potassium 3.5 - 5.1 mmol/L 4.4  3.6  3.2   Chloride 98 - 111 mmol/L 100  99  106   CO2 22 - 32 mmol/L 21  25  23    Calcium 8.9 - 10.3 mg/dL 9.0  9.2  9.3   Total Protein 6.5 - 8.1 g/dL 7.3  8.0  8.0   Total Bilirubin 0.3 - 1.2 mg/dL 1.7  1.3  0.6   Alkaline Phos 38 - 126 U/L 99  117  69   AST 15 - 41 U/L 23  22  22    ALT 0 - 44 U/L 14  14  13         Latest Ref Rng & Units 04/05/2023    3:43 AM 04/04/2023    3:42 PM 12/22/2022   12:08 AM  CBC  WBC 4.0 - 10.5 K/uL 16.1  14.4  12.4   Hemoglobin 12.0 - 15.0 g/dL 56.4  33.2  95.1   Hematocrit 36.0 - 46.0 % 43.1  46.3  44.1   Platelets 150 - 400 K/uL 209  303  411      Author: Loyce Dys, MD 04/05/2023 3:48 PM  For on call review [email or web address].

## 2023-04-05 NOTE — Consult Note (Addendum)
Attending physician's note   I have taken a history, reviewed the chart, and examined the patient. I performed a substantive portion of this encounter, including complete performance of at least one of the key components, in conjunction with the APP. I agree with the APP's note, impression, and recommendations with my edits.  37 year old female with medical history as outlined below, to include history of hospital mission for EtOH pancreatitis in 07/2022 c/b necrosis, pseudocyst, portal vein thrombosis, TPN requirement, then readmission to [facility] in 08/2022 with acute pancreatitis.  Follows with [facility], but has also been seen by GI at [facility] and more recently by surgery at [facility].  Now readmitted with acute pancreatitis in the setting of recent EtOH use.  Admission evaluation notable for the following: - Lipase 288 - WBC 16.1, H/H 14/43 - Normal renal function - T. bili 1.7, otherwise normal liver enzymes - CT: Acute pancreatitis, stable 2.4 x 3.2 cm mid pancreatic body pseudocyst.  Interval development of more distal 1.6 cm pseudocyst.  No PD dilation.  Persistent subcapsular splenic hematoma, but improved compared with previous.  No bowel wall thickening, obstruction.  Cavernous transformation of the portal vein.  1) Acute alcoholic pancreatitis 2) Pancreatic pseudocyst 3) Leukocytosis - Aggressive IV fluid - Pain control and antiemetics per primary service - N.p.o. - Trend daily CMP, CBC - BISAP score 1 on admission (leukocytosis) - Counseled on complete cessation of all EtOH after hospital discharge - No plan for cystgastrostomy at this juncture - Creon   Doristine Locks, DO, FACG [phone] office          Consultation  Referring Provider:  [facility]  Primary Care Physician:  [name], MD Primary Gastroenterologist:  [facility]   Reason for Consultation:     complicated pancreatitis  LOS: 0 days          HPI:   Jillian Shepherd is a 37 y.o. female with past  medical history significant for pancreatitis secondary to EtOH complicated by necrosis, pseudocysts, portal vein thrombosis, and protein-calorie malnutrition requiring TPN 07/2022 presents for evaluation of pancreatitis flare.  Admission January 2024 [facility] with recurrent acute pancreatitis and imaging consistent with pseudocysts at porta hepatis and pancreatic tail with superimposed necrosis of the distal tail. No intervention recommended at that time, -- Of note, During hospitalization December 2023 PICC was placed 07/25/2022 and TPN was started it was stopped in spring 2024 due to infection of PICC.  Patient then followed up outpatient with [name], NP with [facility] 08/2022. Was recommended to continue present medications including creon 72,000 units with meals.  At that time patient was still on TPN and set to follow-up outpatient with [facility].  Seen 10/11/22 by Dr. Pricilla Riffle with [facility] who noted pseudocysts were increasing in size so they recommended MRCP to evaluate for PD leak and started patient on amitriptyline and ensure.  MRCP 10/30/2022 -Pancreatic tail pseudocyst 2.9 cm - Large splenic fluid collection/hematoma measuring up to 10.9 cm - Laboratory changes of pancreatic tail - Suspected thrombi within the intrahepatic portal veins (recommend ultrasound) - Enhancing small lesion of hepatic dome without washout, suspect flash filling hemangioma  Then patient was evaluated 12/17/22 by Dr. Chestine Spore of [facility] gen surg to discuss surgical intervention of her large splenic cyst.  Surgical intervention was not recommended as patient was having clinical improvement. Followed up in June 2024 with same recommendations.  ------------------TODAY-----------------------------------  Patient presents with epigastric pain and associated nausea and vomiting ongoing  since 8/29 AM. States she woke up and had a "fluffy" stool. Then when she stood up she has epigastric pain that became more  progressive as the day went on and she then developed nausea and vomiting. Reports social alcohol use. States Monday (8/26) she had 3 "cocktails" - the kind in a can and one glass of vodka. This was her last alcoholic beverage. She reports no fever. Reports associated chills. Feels this pain is worse than her typical pancreatitis pain. Reports she takes 4 Creon capsules/day.  Pertinent workup: -Lipase 281.   -CBC with leukocytosis of 14.4, Hgb 15, platelet 303 -Presented with BP 180/98, heart rate 70 - CT abdomen pelvis with contrast with acute pancreatitis. Stable 2.4 x 3.2 cm mid pancreatic body pseudocyst.  Interval development of slightly more distal 1.6 cm pseudocyst.  No PD dilation. Smaller subscapular splenic hematoma measuring 11.3 x 7.6 cm (from 12.5 x 8.7 cm). Cavernous transformation of portal vein - triglycerides 08/2022: 579  Past Medical History:  Diagnosis Date   Abdominal pain    Alcohol abuse    B12 deficiency    Cancer (HCC)    squmaous cell skin cancer    Collagen vascular disease (HCC)    Hypokalemia    Liddle's syndrome    Obesity (BMI 35.0-39.9 without comorbidity)    Pancreatitis    Pleural effusion    Splenic hemorrhage     Surgical History:  She  has a past surgical history that includes Tonsillectomy; Adenoidectomy; Colonoscopy with propofol (N/A, 10/06/2020); Esophagogastroduodenoscopy (egd) with propofol (N/A, 10/06/2020); skin cancer removal (Left); and Chest tube insertion. Family History:  Her family history includes Anxiety disorder in her father; Cirrhosis in her paternal uncle; Depression in her father; Diabetes in her paternal grandmother; Hypertension in her father; Melanoma in her paternal grandmother. Social History:   reports that she has been smoking cigarettes. She has a 2.3 pack-year smoking history. She has been exposed to tobacco smoke. She has never used smokeless tobacco. She reports current alcohol use of about 2.0 standard drinks of  alcohol per week. She reports that she does not use drugs.  Prior to Admission medications   Medication Sig Start Date End Date Taking? Authorizing Provider  amitriptyline (ELAVIL) 50 MG tablet Take 50 mg by mouth at bedtime as needed for sleep. 04/01/23  Yes [provider]  buPROPion (WELLBUTRIN XL) 150 MG 24 hr tablet Take 1 tablet (150 mg total) by mouth daily. 11/06/22  Yes [name], MD  cariprazine (VRAYLAR) 1.5 MG capsule Take 1 capsule (1.5 mg total) by mouth daily. 01/28/23  Yes [name], MD  cyanocobalamin (VITAMIN B12) 1000 MCG/ML injection Inject 1 mL (1,000 mcg total) into the muscle every 30 (thirty) days. 05/17/22  Yes [name], MD  KLOR-CON M20 20 MEQ tablet Take 20 mEq by mouth daily. 09/08/22  Yes [provider]  lipase/protease/amylase (CREON) 36000 UNITS CPEP capsule Take 2 capsules (72,000 Units total) by mouth 3 (three) times daily with meals. May also take 1 capsule (36,000 Units total) as needed (with snacks). 07/20/22  Yes Park Meo, FNP  ondansetron (ZOFRAN) 4 MG tablet Take 1 tablet (4 mg total) by mouth every 8 (eight) hours as needed for nausea or vomiting. 03/11/23  Yes [name], MD  Oxycodone HCl 10 MG TABS Take 1 tablet (10 mg total) by mouth every 6 (six) hours as needed (breakthrough pain only). 04/01/23  Yes [name], MD  amLODipine (NORVASC) 10  MG tablet Take 1 tablet (10 mg total) by mouth daily. Patient not taking: Reported on 04/05/2023 04/10/22   Park Meo, FNP  folic acid (FOLVITE) 1 MG tablet Take 1 tablet (1 mg total) by mouth daily. Patient not taking: Reported on 04/05/2023 05/17/22 05/12/23  [name], MD  hydrOXYzine (VISTARIL) 25 MG capsule Take 1 capsule (25 mg total) by mouth every 8 (eight) hours as needed (for sleep). Patient not taking: Reported on 04/05/2023 08/30/22   [name], MD    Current Facility-Administered Medications  Medication Dose Route Frequency Provider  Last Rate Last Admin   0.9 %  sodium chloride infusion  250 mL Intravenous PRN Janalyn Shy, Subrina, MD       acetaminophen (TYLENOL) tablet 650 mg  650 mg Oral Q6H PRN Janalyn Shy, Subrina, MD       Or   acetaminophen (TYLENOL) suppository 650 mg  650 mg Rectal Q6H PRN Janalyn Shy, Subrina, MD       amLODipine (NORVASC) tablet 10 mg  10 mg Oral Daily Sundil, Subrina, MD   10 mg at 04/05/23 1009   buPROPion (WELLBUTRIN XL) 24 hr tablet 150 mg  150 mg Oral Daily Sundil, Subrina, MD   150 mg at 04/05/23 1010   cariprazine (VRAYLAR) capsule 1.5 mg  1.5 mg Oral Daily Sundil, Subrina, MD   1.5 mg at 04/05/23 1010   [START ON 04/06/2023] folic acid (FOLVITE) tablet 1 mg  1 mg Oral Daily Sundil, Subrina, MD       hydrALAZINE (APRESOLINE) injection 10 mg  10 mg Intravenous Q8H PRN Janalyn Shy, Subrina, MD   10 mg at 04/05/23 0320   HYDROmorphone (DILAUDID) injection 1 mg  1 mg Intravenous Q4H PRN Sundil, Subrina, MD   1 mg at 04/05/23 1243   hydrOXYzine (ATARAX) tablet 25 mg  25 mg Oral Q8H PRN Janalyn Shy, Subrina, MD       lactated ringers infusion   Intravenous Continuous Janalyn Shy, Subrina, MD 125 mL/hr at 04/05/23 1242 New Bag at 04/05/23 1242   LORazepam (ATIVAN) injection 1 mg  1 mg Intramuscular Once Sundil, Subrina, MD       LORazepam (ATIVAN) tablet 1-4 mg  1-4 mg Oral Q1H PRN Janalyn Shy, Subrina, MD   1 mg at 04/05/23 4098   Or   LORazepam (ATIVAN) injection 1-4 mg  1-4 mg Intravenous Q1H PRN Janalyn Shy, Subrina, MD       morphine (PF) 2 MG/ML injection 2 mg  2 mg Intravenous Q2H PRN Janalyn Shy, Subrina, MD   2 mg at 04/05/23 1015   [START ON 04/06/2023] multivitamin with minerals tablet 1 tablet  1 tablet Oral Daily Sundil, Subrina, MD       senna-docusate (Senokot-S) tablet 1 tablet  1 tablet Oral QHS PRN Janalyn Shy, Subrina, MD       sodium chloride flush (NS) 0.9 % injection 3 mL  3 mL Intravenous Q12H Sundil, Subrina, MD   3 mL at 04/05/23 0801   sodium chloride flush (NS) 0.9 % injection 3 mL  3 mL Intravenous PRN Janalyn Shy, Subrina,  MD       Melene Muller ON 04/06/2023] thiamine (VITAMIN B1) tablet 100 mg  100 mg Oral Daily Sundil, Subrina, MD       Or   Melene Muller ON 04/06/2023] thiamine (VITAMIN B1) injection 100 mg  100 mg Intravenous Daily Sundil, Subrina, MD        Allergies as of 04/04/2023   (No Known Allergies)    Review of Systems  Constitutional:  Positive for chills. Negative for fever and weight loss.  HENT:  Negative for hearing loss and tinnitus.   Eyes:  Negative for blurred vision and double vision.  Respiratory:  Negative for cough and hemoptysis.   Cardiovascular:  Negative for chest pain and palpitations.  Gastrointestinal:  Positive for abdominal pain, diarrhea, nausea and vomiting. Negative for blood in stool, constipation and heartburn.  Genitourinary:  Negative for dysuria and urgency.  Musculoskeletal:  Negative for myalgias and neck pain.  Skin:  Negative for itching and rash.  Neurological:  Negative for seizures and loss of consciousness.  Psychiatric/Behavioral:  Negative for depression and suicidal ideas.        Physical Exam:  Vital signs in last 24 hours: Temp:  [98 F (36.7 C)-98.6 F (37 C)] 98.1 F (36.7 C) (08/30 1134) Pulse Rate:  [65-99] 87 (08/30 1134) Resp:  [15-20] 18 (08/30 1134) BP: (153-189)/(87-112) 170/105 (08/30 1134) SpO2:  [93 %-98 %] 96 % (08/30 1134) Weight:  [76.5 kg] 76.5 kg (08/30 0334) Last BM Date : 04/04/23 Last BM recorded by nurses in past 5 days No data recorded  Physical Exam Constitutional:      Appearance: She is obese. She is ill-appearing.  HENT:     Head: Normocephalic and atraumatic.     Nose: Nose normal. No congestion.     Mouth/Throat:     Mouth: Mucous membranes are moist.     Pharynx: Oropharynx is clear.  Eyes:     General: No scleral icterus.    Extraocular Movements: Extraocular movements intact.  Cardiovascular:     Rate and Rhythm: Normal rate and regular rhythm.  Pulmonary:     Effort: Pulmonary effort is normal. No respiratory  distress.  Abdominal:     Palpations: Abdomen is soft.     Tenderness: There is abdominal tenderness (epigastric).  Musculoskeletal:        General: No swelling. Normal range of motion.     Cervical back: Normal range of motion and neck supple.  Skin:    General: Skin is warm and dry.     Coloration: Skin is not jaundiced.  Neurological:     General: No focal deficit present.     Mental Status: She is oriented to person, place, and time.  Psychiatric:        Mood and Affect: Mood normal.        Behavior: Behavior normal.        Thought Content: Thought content normal.        Judgment: Judgment normal.      LAB RESULTS: Recent Labs    04/04/23 1542 04/05/23 0343  WBC 14.4* 16.1*  HGB 15.0 14.4  HCT 46.3* 43.1  PLT 303 209   BMET Recent Labs    04/04/23 1542 04/05/23 0343  NA 136 136  K 3.6 4.4  CL 99 100  CO2 25 21*  GLUCOSE 131* 142*  BUN 11 11  CREATININE 0.65 0.54  CALCIUM 9.2 9.0   LFT Recent Labs    04/05/23 0343  PROT 7.3  ALBUMIN 3.5  AST 23  ALT 14  ALKPHOS 99  BILITOT 1.7*   PT/INR No results for input(s): "LABPROT", "INR" in the last 72 hours.  STUDIES: CT ABDOMEN PELVIS W CONTRAST  Result Date: 04/05/2023 CLINICAL DATA:  Pancreatitis, acute, severe Abdominal pain, acute, nonlocalized. Pt reports she is having a flare of her pancreatitis. Pain 10/10/ Vomiting. EXAM: CT ABDOMEN AND PELVIS WITH CONTRAST TECHNIQUE: Multidetector CT imaging  of the abdomen and pelvis was performed using the standard protocol following bolus administration of intravenous contrast. RADIATION DOSE REDUCTION: This exam was performed according to the departmental dose-optimization program which includes automated exposure control, adjustment of the mA and/or kV according to patient size and/or use of iterative reconstruction technique. CONTRAST:  75mL OMNIPAQUE IOHEXOL 350 MG/ML SOLN COMPARISON:  CT abdomen pelvis 08/15/2022 FINDINGS: Lower chest: No acute abnormality.  Hepatobiliary: Pancreas: Peripancreatic free fluid and fat stranding. Pancreatic contour hazzy. Stable 2.4 x 3.2 cm mid pancreatic body pseudocyst. Interval development of slightly more distal 1.6 cm pseudocyst. No main pancreatic ductal dilatation. Spleen: Persistent slightly smaller 11.3 x 7.6 cm (from 12.5 x 8.7 cm) subcapsular splenic hematoma. No new focal lesion. Adrenals/Urinary Tract: No adrenal nodule bilaterally. Bilateral kidneys enhance symmetrically. No hydronephrosis. No hydroureter. The urinary bladder is unremarkable. Stomach/Bowel: Stomach is within normal limits. No evidence of bowel wall thickening or dilatation. Appendix appears normal. Vascular/Lymphatic: Venous collaterals along the expected main portal vein with previously identified portal vein thrombus not visualized. No abdominal aorta or iliac aneurysm. Mild atherosclerotic plaque of the aorta and its branches. No abdominal, pelvic, or inguinal lymphadenopathy. Reproductive: Uterus and bilateral adnexa are unremarkable. Other: No intraperitoneal free fluid. No intraperitoneal free gas. No organized fluid collection. Musculoskeletal: No abdominal wall hernia or abnormality. No suspicious lytic or blastic osseous lesions. No acute displaced fracture. IMPRESSION: 1. Acute pancreatitis with couple pseudocyst as described above. 2. Persistent slightly smaller 11.3 x 7.6 cm (from 12.5 x 8.7 cm) subcapsular splenic hematoma. 3. Cavernous transformation of the portal vein. Electronically Signed   By: Tish Frederickson M.D.   On: 04/05/2023 00:59    Patient profile   37 y.o. female with past medical history significant for pancreatitis secondary to EtOH complicated by necrosis, pseudocysts, portal vein thrombosis, multiple hospitalizations for same presents for recurrent pancreatitis. -October 2023: initially presented with alcohol associated acute necrotizing pancreatitis. Bleeding after starting anticoagulation for portal vein and splenic vein  thrombosis. Left-sided pleural effusion managed with chest tube - January 2024: Imaging with persistent areas of walled off necrosis with 1 dominant collection in the tail (1 with debris).  Large subscapular splenic hematoma sequelae of her acute pancreatitis and anticoagulation. Collection is too small to drain endoscopically - May/June 2024: Outpatient general surgery recommends against surgical intervention due to clinical improvement   Impression    Acute on chronic complicated pancreatitis - CT showing acute pancreatitis, decrease in size of known splenic hematoma and stable pseudocysts.  Cavernous transformation of portal vein -Lipase 281.   -CBC with leukocytosis of 16.1, Hgb 14.4, platelet 209 -BP 180/98, heart rate 70 - triglycerides 08/2022 579 Likely flared due to recent alcohol use. However, she does have hypertriglyceridemia noted January 2024. She does not appear to be on a statin or fibrate. Gallbladder not commented on within recent imaging. Splenic hematoma appears to be improving and her pseudocysts are stable which is reassuring.    Plan   - check lipid profile/triglycerides - IgG4 -Fluid replacement lactated ringers 200-250cc/hr (3 cc/kg/hr) for 24 hrs and then can decrease to 150cc/hr -Watch for hypervolemia -Pain control per the inpatient medical team. -Encourage early ambulation -Advance diet as tolerated.  -If the patient not tolerating an oral diet, would recommend consideration of nutritional support with a nasogastric or nasojejunal postpyloric feedings. Monitor ileus. -Repeat CT scan if concern for worsening pseudocyst, or abscess or no improvement in 48 hours -Alcohol cessation counseling. -Trend CBC, CMP -Continue Creon -VTE prophylaxis.  Thank you for your kind consultation, we will continue to follow.   Legrand Como  04/05/2023, 12:48 PM

## 2023-04-05 NOTE — H&P (Addendum)
History and Physical    Jillian Shepherd:[id number] DOB: Sep 26, 1985 DOA: 04/04/2023  PCP: [name], MD   Patient coming from: Home   Chief Complaint:  Chief Complaint  Patient presents with   Abdominal Pain    HPI:  Jillian Shepherd is a 37 y.o. female with medical history significant of history of chronic alcohol use complicated by alcoholic pancreatitis with necrosis and pseudocyst, known occlusive thrombosis of the portal vein/splenic vein, chronic hematuria, essential hypertension and GERD presented to emergency department complaining of pancreatitis flare, nausea and vomiting. Patient is reporting that she is having midepigastric abdominal pain that has been started earlier this morning 04/04/2023.  The pain is constant 8 out of 10 intensity with associated nausea and vomiting.  Patient denies any fever and chills.  Denies any chest pain, shortness of breath breath, cough, palpitation, constipation and diarrhea.  Patient reported to ED provider that currently she has been drinking 3 to 4 cans of beer in a day however when I asked about drinking alcohol patient is avoiding eye contact with me and saying that she has been sober and not drinking alcohol.  Has been evaluated by outpatient gastroenterology in January 2024 at [facility] gastroenterology associate.  During the clinic visit patient has been given prescription of Creon.  After that patient followed up with gastroenterology with [facility] in February 2024 by Dr. Odessa Fleming.   ED Course:  At presentation to ED patient blood pressure found elevated 180/98.  Heart rate 70, respiratory 20 O2 sat 90% room air.  Blood alcohol level less than 10. Elevated lipase of 281. CMP unremarkable except elevated total bilirubin 1.3. CBC showed leukocytosis 14.4, hemoglobin 15 and platelet 303.  CT abdomen pelvis showing acute pancreatitis with couple pseudocyst, persistent slightly smaller subcapsular splenic hematoma and  cavernous transformation of the portal vein.  In the ED patient has been giving fentanyl 50 mcg once, Dilaudid 1 mg once and patient being started on CIWA protocol with Ativan as needed.  Hospitalist has been consulted for admission management of acute alcoholic pancreatitis and acute alcohol withdrawal.  Review of Systems:  Review of Systems  Constitutional:  Negative for chills, fever, malaise/fatigue and weight loss.  Respiratory:  Negative for cough, sputum production and shortness of breath.   Cardiovascular:  Negative for chest pain and palpitations.  Gastrointestinal:  Positive for abdominal pain, nausea and vomiting. Negative for constipation, diarrhea and heartburn.  Genitourinary:  Negative for dysuria.  Musculoskeletal:  Negative for myalgias and neck pain.  Skin:  Negative for rash.  Neurological:  Negative for dizziness and headaches.  Endo/Heme/Allergies:  Does not bruise/bleed easily.  Psychiatric/Behavioral:  Negative for depression, hallucinations and substance abuse. The patient is not nervous/anxious.     Past Medical History:  Diagnosis Date   Abdominal pain    Alcohol abuse    B12 deficiency    Cancer (HCC)    squmaous cell skin cancer    Collagen vascular disease (HCC)    Hypokalemia    Liddle's syndrome    Obesity (BMI 35.0-39.9 without comorbidity)    Pancreatitis    Pleural effusion    Splenic hemorrhage     Past Surgical History:  Procedure Laterality Date   ADENOIDECTOMY     CHEST TUBE INSERTION     COLONOSCOPY WITH PROPOFOL N/A 10/06/2020   Procedure: COLONOSCOPY WITH PROPOFOL;  Surgeon: Toney Reil, MD;  Location: [facility] ENDOSCOPY;  Service: Gastroenterology;  Laterality: N/A;  COVID POSITIVE 08/23/2020   ESOPHAGOGASTRODUODENOSCOPY (  EGD) WITH PROPOFOL N/A 10/06/2020   Procedure: ESOPHAGOGASTRODUODENOSCOPY (EGD) WITH PROPOFOL;  Surgeon: Toney Reil, MD;  Location: [facility] ENDOSCOPY;  Service: Gastroenterology;  Laterality: N/A;   skin  cancer removal Left    leg   TONSILLECTOMY       reports that she has been smoking cigarettes. She has a 2.3 pack-year smoking history. She has been exposed to tobacco smoke. She has never used smokeless tobacco. She reports current alcohol use of about 2.0 standard drinks of alcohol per week. She reports that she does not use drugs.  No Known Allergies  Family History  Problem Relation Age of Onset   Hypertension Father    Anxiety disorder Father    Depression Father    Melanoma Paternal Grandmother        of skin   Diabetes Paternal Grandmother    Cirrhosis Paternal Uncle     Prior to Admission medications   Medication Sig Start Date End Date Taking? Authorizing Provider  amLODipine (NORVASC) 10 MG tablet Take 1 tablet (10 mg total) by mouth daily. Patient taking differently: Take 10 mg by mouth in the morning. 04/10/22   Park Meo, FNP  bacitracin ointment Apply 1 Application topically 2 (two) times daily as needed for wound care. 11/07/22   Achille Rich, PA-C  bisacodyl (DULCOLAX) 10 MG suppository Place 1 suppository (10 mg total) rectally daily as needed for moderate constipation. 08/23/22   Johnson, Clanford L, MD  buPROPion (WELLBUTRIN XL) 150 MG 24 hr tablet Take 1 tablet (150 mg total) by mouth daily. 11/06/22   [name], MD  cariprazine (VRAYLAR) 1.5 MG capsule Take 1 capsule (1.5 mg total) by mouth daily. 01/28/23   [name], MD  cyanocobalamin (VITAMIN B12) 1000 MCG/ML injection Inject 1 mL (1,000 mcg total) into the muscle every 30 (thirty) days. 05/17/22   [name], MD  folic acid (FOLVITE) 1 MG tablet Take 1 tablet (1 mg total) by mouth daily. Patient taking differently: Take 1 mg by mouth in the morning. 05/17/22 05/12/23  [name], MD  hydrOXYzine (VISTARIL) 25 MG capsule Take 1 capsule (25 mg total) by mouth every 8 (eight) hours as needed (for sleep). 08/30/22   [name], MD  KLOR-CON M20 20 MEQ tablet Take by mouth. 09/08/22    [provider]  lipase/protease/amylase (CREON) 36000 UNITS CPEP capsule Take 2 capsules (72,000 Units total) by mouth 3 (three) times daily with meals. May also take 1 capsule (36,000 Units total) as needed (with snacks). 07/20/22   Park Meo, FNP  ondansetron (ZOFRAN) 4 MG tablet Take 1 tablet (4 mg total) by mouth every 8 (eight) hours as needed for nausea or vomiting. 03/11/23   [name], MD  Oxycodone HCl 10 MG TABS Take 1 tablet (10 mg total) by mouth every 6 (six) hours as needed (breakthrough pain only). 04/01/23   [name], MD  pantoprazole (PROTONIX) 40 MG tablet Take 1 tablet (40 mg total) by mouth daily. 07/13/22 12/27/22  Kendell Bane, MD     Physical Exam: Vitals:   04/05/23 0057 04/05/23 0100 04/05/23 0130 04/05/23 0200  BP: (!) 172/97 (!) 187/94 (!) 160/96 (!) 163/93  Pulse: 76 67 86 88  Resp: 19 16 17 15   Temp:      TempSrc:      SpO2: 94% 94% 93% 96%    Physical Exam HENT:     Mouth/Throat:     Mouth:  Mucous membranes are moist.  Cardiovascular:     Rate and Rhythm: Normal rate and regular rhythm.     Heart sounds: Normal heart sounds.  Pulmonary:     Effort: Pulmonary effort is normal.  Abdominal:     General: Abdomen is flat. Bowel sounds are normal. There is no distension.     Palpations: Abdomen is rigid. There is no shifting dullness.     Tenderness: There is no abdominal tenderness. There is no guarding or rebound. Negative signs include Murphy's sign.     Hernia: No hernia is present.  Skin:    Capillary Refill: Capillary refill takes less than 2 seconds.  Neurological:     Mental Status: She is alert and oriented to person, place, and time.      Labs on Admission: I have personally reviewed following labs and imaging studies  CBC: Recent Labs  Lab 04/04/23 1542  WBC 14.4*  NEUTROABS 12.0*  HGB 15.0  HCT 46.3*  MCV 83.6  PLT 303   Basic Metabolic Panel: Recent Labs  Lab 04/04/23 1542  NA 136  K 3.6   CL 99  CO2 25  GLUCOSE 131*  BUN 11  CREATININE 0.65  CALCIUM 9.2   GFR: CrCl cannot be calculated (Unknown ideal weight.). Liver Function Tests: Recent Labs  Lab 04/04/23 1542  AST 22  ALT 14  ALKPHOS 117  BILITOT 1.3*  PROT 8.0  ALBUMIN 4.0   Recent Labs  Lab 04/04/23 1542  LIPASE 281*   No results for input(s): "AMMONIA" in the last 168 hours. Coagulation Profile: No results for input(s): "INR", "PROTIME" in the last 168 hours. Cardiac Enzymes: No results for input(s): "CKTOTAL", "CKMB", "CKMBINDEX", "TROPONINI", "TROPONINIHS" in the last 168 hours. BNP (last 3 results) Recent Labs    05/12/22 0730  BNP 42.0   HbA1C: No results for input(s): "HGBA1C" in the last 72 hours. CBG: No results for input(s): "GLUCAP" in the last 168 hours. Lipid Profile: No results for input(s): "CHOL", "HDL", "LDLCALC", "TRIG", "CHOLHDL", "LDLDIRECT" in the last 72 hours. Thyroid Function Tests: No results for input(s): "TSH", "T4TOTAL", "FREET4", "T3FREE", "THYROIDAB" in the last 72 hours. Anemia Panel: No results for input(s): "VITAMINB12", "FOLATE", "FERRITIN", "TIBC", "IRON", "RETICCTPCT" in the last 72 hours. Urine analysis:    Component Value Date/Time   COLORURINE AMBER (A) 04/04/2023 1535   APPEARANCEUR CLOUDY (A) 04/04/2023 1535   LABSPEC 1.021 04/04/2023 1535   PHURINE 6.0 04/04/2023 1535   GLUCOSEU NEGATIVE 04/04/2023 1535   HGBUR NEGATIVE 04/04/2023 1535   BILIRUBINUR SMALL (A) 04/04/2023 1535   KETONESUR 80 (A) 04/04/2023 1535   PROTEINUR 100 (A) 04/04/2023 1535   UROBILINOGEN 0.2 09/28/2007 0450   NITRITE NEGATIVE 04/04/2023 1535   LEUKOCYTESUR TRACE (A) 04/04/2023 1535    Radiological Exams on Admission: I have personally reviewed images CT ABDOMEN PELVIS W CONTRAST  Result Date: 04/05/2023 CLINICAL DATA:  Pancreatitis, acute, severe Abdominal pain, acute, nonlocalized. Pt reports she is having a flare of her pancreatitis. Pain 10/10/ Vomiting. EXAM: CT  ABDOMEN AND PELVIS WITH CONTRAST TECHNIQUE: Multidetector CT imaging of the abdomen and pelvis was performed using the standard protocol following bolus administration of intravenous contrast. RADIATION DOSE REDUCTION: This exam was performed according to the departmental dose-optimization program which includes automated exposure control, adjustment of the mA and/or kV according to patient size and/or use of iterative reconstruction technique. CONTRAST:  75mL OMNIPAQUE IOHEXOL 350 MG/ML SOLN COMPARISON:  CT abdomen pelvis 08/15/2022 FINDINGS: Lower chest: No acute  abnormality. Hepatobiliary: Pancreas: Peripancreatic free fluid and fat stranding. Pancreatic contour hazzy. Stable 2.4 x 3.2 cm mid pancreatic body pseudocyst. Interval development of slightly more distal 1.6 cm pseudocyst. No main pancreatic ductal dilatation. Spleen: Persistent slightly smaller 11.3 x 7.6 cm (from 12.5 x 8.7 cm) subcapsular splenic hematoma. No new focal lesion. Adrenals/Urinary Tract: No adrenal nodule bilaterally. Bilateral kidneys enhance symmetrically. No hydronephrosis. No hydroureter. The urinary bladder is unremarkable. Stomach/Bowel: Stomach is within normal limits. No evidence of bowel wall thickening or dilatation. Appendix appears normal. Vascular/Lymphatic: Venous collaterals along the expected main portal vein with previously identified portal vein thrombus not visualized. No abdominal aorta or iliac aneurysm. Mild atherosclerotic plaque of the aorta and its branches. No abdominal, pelvic, or inguinal lymphadenopathy. Reproductive: Uterus and bilateral adnexa are unremarkable. Other: No intraperitoneal free fluid. No intraperitoneal free gas. No organized fluid collection. Musculoskeletal: No abdominal wall hernia or abnormality. No suspicious lytic or blastic osseous lesions. No acute displaced fracture. IMPRESSION: 1. Acute pancreatitis with couple pseudocyst as described above. 2. Persistent slightly smaller 11.3 x 7.6  cm (from 12.5 x 8.7 cm) subcapsular splenic hematoma. 3. Cavernous transformation of the portal vein. Electronically Signed   By: Tish Frederickson M.D.   On: 04/05/2023 00:59    EKG: My personal interpretation of EKG shows: Normal sinus rhythm heart rate 74.  There is no ST and T wave abnormality.     Assessment/Plan: Principal Problem:   Acute alcoholic pancreatitis Active Problems:   Essential hypertension   Spleen hematoma   Pancreatic pseudocyst   Cavernous transformation of portal vein   GERD (gastroesophageal reflux disease)   Elevated lipase   Chronic alcohol use    Assessment and Plan: Acute alcoholic pancreatitis History of chronic pancreatitis History of splenic hematoma-size decreasing History of pancreatic pseudocyst -Patient coming with complaining of midepigastric abdominal pain, with associated nausea and vomiting for last 24 hours.  Unable to tolerate any oral diet.  Patient reported drinking 3 to 4 cans of beer in a day now. - Found to have elevated lipase 281.  CBC showing leukocytosis in 14.4.  CMP grossly unremarkable no evidence of transaminitis. - CT abdomen pelvis showed acute pancreatitis with couple pseudocyst.  Persistent slightly smaller 11.3 x 7.6 cm (from 12.5 x 8.7 cm) subcapsular splenic hematoma.  And Cavernous transformation of the portal vein.  This CT abdomen pelvis did not showed any portal vein thrombosis anymore. -Admitting patient for the management of acute pancreatitis. - Continue IV fluid LR 125 cc/h. - Currently keeping patient NPO.  She is afraid to eat anything now.  Will advance diet as patient tolerates. - Continue Dilaudid 1 mg every 4 hour as needed for moderate and severe pain and morphine 2 mg every 2 hour as needed for severe and breakthrough pain. -Continue Zofran as needed -Consulted gastroenterology for further evaluation.  Please reach out in the a.m.   Chronic alcohol use - Patient reported drinking 2 to 3 cans of beer to  the EDP physician however she declined to me.  At this time patient does not have any signs of alcohol withdrawal. - Blood alcohol level less than 10 - However continue CIWA protocol and Ativan as needed. - Continue multivitamin, thiamine and folic acid daily. -Continue fall precaution.  Essential hypertension Elevated blood pressure secondary to pain reaction - Patient reported she was not able to taking amlodipine this morning due to nausea and abdominal pain.  Also she is in a lot of pain.  At  presentation and found to have elevated systolic blood pressure 180 which has been improved to 163. - Continue pain control and monitor blood pressure. - Resumed home amlodipine and started hydralazine as needed.  General anxiety disorder - Resumed home BuSpar on 50 mg daily, Atarax as needed.    DVT prophylaxis:  SCDs.  Holding pharmacological prophylaxis due to splenic hematoma. Code Status:  Full Code Diet: Currently n.p.o. due to acute flare of pancreatitis. Family Communication: Discussed treatment plan with patient. Disposition Plan: Pending clinical improvement.  Currently NPO.  Advance diet as patient tolerates. Consults: Gastroenterology Admission status:   Inpatient, Med-Surg  Severity of Illness: The appropriate patient status for this patient is INPATIENT. Inpatient status is judged to be reasonable and necessary in order to provide the required intensity of service to ensure the patient's safety. The patient's presenting symptoms, physical exam findings, and initial radiographic and laboratory data in the context of their chronic comorbidities is felt to place them at high risk for further clinical deterioration. Furthermore, it is not anticipated that the patient will be medically stable for discharge from the hospital within 2 midnights of admission.   * I certify that at the point of admission it is my clinical judgment that the patient will require inpatient hospital care  spanning beyond 2 midnights from the point of admission due to high intensity of service, high risk for further deterioration and high frequency of surveillance required.Marland Kitchen    Tereasa Coop, MD [facility]  How to contact the [facility] Attending or Consulting provider 7A - 7P or covering provider during after hours 7P -7A, for this patient.  Check the care team in [facility] and look for a) attending/consulting [facility] provider listed and b) the [facility] team listed Log into [email or web address] and use [place] universal password to access. If you do not have the password, please contact the hospital operator. Locate the [facility] provider you are looking for under [facility] and page to a number that you can be directly reached. If you still have difficulty reaching the provider, please page the [facility] (Director on Call) for the Hospitalists listed on amion for assistance.  04/05/2023, 2:14 AM

## 2023-04-05 NOTE — ED Notes (Signed)
To CT

## 2023-04-06 ENCOUNTER — Encounter (HOSPITAL_COMMUNITY): Payer: Self-pay | Admitting: Internal Medicine

## 2023-04-06 DIAGNOSIS — K852 Alcohol induced acute pancreatitis without necrosis or infection: Secondary | ICD-10-CM | POA: Diagnosis not present

## 2023-04-06 DIAGNOSIS — F1090 Alcohol use, unspecified, uncomplicated: Secondary | ICD-10-CM | POA: Diagnosis not present

## 2023-04-06 DIAGNOSIS — K863 Pseudocyst of pancreas: Secondary | ICD-10-CM | POA: Diagnosis not present

## 2023-04-06 DIAGNOSIS — I81 Portal vein thrombosis: Secondary | ICD-10-CM

## 2023-04-06 LAB — COMPREHENSIVE METABOLIC PANEL
ALT: 10 U/L (ref 0–44)
AST: 16 U/L (ref 15–41)
Albumin: 3.1 g/dL — ABNORMAL LOW (ref 3.5–5.0)
Alkaline Phosphatase: 72 U/L (ref 38–126)
Anion gap: 12 (ref 5–15)
BUN: <5 mg/dL — ABNORMAL LOW (ref 6–20)
CO2: 23 mmol/L (ref 22–32)
Calcium: 8.6 mg/dL — ABNORMAL LOW (ref 8.9–10.3)
Chloride: 96 mmol/L — ABNORMAL LOW (ref 98–111)
Creatinine, Ser: 0.54 mg/dL (ref 0.44–1.00)
GFR, Estimated: >60 mL/min (ref >60)
Glucose, Bld: 100 mg/dL — ABNORMAL HIGH (ref 70–99)
Potassium: 3 mmol/L — ABNORMAL LOW (ref 3.5–5.1)
Sodium: 131 mmol/L — ABNORMAL LOW (ref 135–145)
Total Bilirubin: 1.5 mg/dL — ABNORMAL HIGH (ref 0.3–1.2)
Total Protein: 6.6 g/dL (ref 6.5–8.1)

## 2023-04-06 LAB — CBC
HCT: 40.7 % (ref 36.0–46.0)
Hemoglobin: 13.3 g/dL (ref 12.0–15.0)
MCH: 27.8 pg (ref 26.0–34.0)
MCHC: 32.7 g/dL (ref 30.0–36.0)
MCV: 85 fL (ref 80.0–100.0)
Platelets: 224 10*3/uL (ref 150–400)
RBC: 4.79 MIL/uL (ref 3.87–5.11)
RDW: 16.8 % — ABNORMAL HIGH (ref 11.5–15.5)
WBC: 13 10*3/uL — ABNORMAL HIGH (ref 4.0–10.5)
nRBC: 0 % (ref 0.0–0.2)

## 2023-04-06 LAB — IGG 4: IgG, Subclass 4: 80 mg/dL (ref 2–96)

## 2023-04-06 MED ORDER — POTASSIUM CHLORIDE CRYS ER 20 MEQ PO TBCR
40.0000 meq | EXTENDED_RELEASE_TABLET | ORAL | Status: AC
  Administered 2023-04-06 (×2): 40 meq via ORAL
  Filled 2023-04-06 (×2): qty 2

## 2023-04-06 MED ORDER — ONDANSETRON HCL 4 MG/2ML IJ SOLN
4.0000 mg | Freq: Three times a day (TID) | INTRAMUSCULAR | Status: DC | PRN
  Administered 2023-04-06: 4 mg via INTRAVENOUS
  Filled 2023-04-06: qty 2

## 2023-04-06 MED ORDER — LACTATED RINGERS IV SOLN: INTRAVENOUS | Status: AC

## 2023-04-06 MED ORDER — OXYCODONE HCL 5 MG PO TABS
5.0000 mg | ORAL_TABLET | ORAL | Status: DC | PRN
  Administered 2023-04-07 – 2023-04-08 (×7): 10 mg via ORAL
  Filled 2023-04-06 (×8): qty 2

## 2023-04-06 NOTE — Progress Notes (Signed)
PROGRESS NOTE  Jillian Shepherd MWU:[id number] DOB: 05/14/1986 DOA: 04/04/2023 PCP: [name], MD   LOS: 1 day   Brief Narrative / Interim history: 37 y.o. female with medical history significant of history of chronic alcohol use complicated by alcoholic pancreatitis with necrosis and pseudocyst, known occlusive thrombosis of the portal vein/splenic vein, chronic hematuria, essential hypertension and GERD presented to emergency department complaining of pancreatitis flare, nausea and vomiting.  Reports drinking recently but just 1 day last Monday  Subjective / 24h Interval events: Continues to have abdominal pain but overall states that she is feeling a little bit better  Assesement and Plan: Principal Problem:   Acute alcoholic pancreatitis Active Problems:   Essential hypertension   Spleen hematoma   Pancreatic pseudocyst   Cavernous transformation of portal vein   GERD (gastroesophageal reflux disease)   Elevated lipase   Chronic alcohol use   Pseudocyst of pancreas due to acute pancreatitis   Principal problem Acute on chronic pancreatitis, with pseudocysts -CT scan on admission showed evidence of pancreatitis with a mid pancreatic body pseudocyst as well as a more distal 1.  CT scan also showed subcapsular splenic hematoma and cavernous transformation of the portal vein. -Continue fluids, clear liquid, pain control, antiemetics  Active problems Chronic alcohol use -encouraged complete cessation  Essential hypertension-continue amlodipine  GAD-continue BuSpar, antianxiety medications  Obesity, class I-BMI borderline, 30.5  Hypokalemia-replace potassium today.  Check magnesium  Hyponatremia-continue fluids  Leukocytosis-likely reactive, monitor, improving  Scheduled Meds:  amLODipine  10 mg Oral Daily   buPROPion  150 mg Oral Daily   cariprazine  1.5 mg Oral Daily   folic acid  1 mg Oral Daily   LORazepam  1 mg Intramuscular Once   multivitamin with  minerals  1 tablet Oral Daily   thiamine  100 mg Oral Daily   Or   thiamine  100 mg Intravenous Daily   Continuous Infusions:  lactated ringers 250 mL/hr at 04/06/23 0231   lactated ringers 125 mL/hr at 04/06/23 1024   PRN Meds:.acetaminophen **OR** acetaminophen, hydrALAZINE, HYDROmorphone (DILAUDID) injection, hydrOXYzine, LORazepam **OR** LORazepam, morphine injection, senna-docusate  Current Outpatient Medications  Medication Instructions   amitriptyline (ELAVIL) 50 mg, Oral, At bedtime PRN   amLODipine (NORVASC) 10 mg, Oral, Daily   buPROPion (WELLBUTRIN XL) 150 mg, Oral, Daily   cariprazine (VRAYLAR) 1.5 mg, Oral, Daily   cyanocobalamin (VITAMIN B12) 1,000 mcg, Intramuscular, Every 30 days   folic acid (FOLVITE) 1 mg, Oral, Daily   hydrOXYzine (VISTARIL) 25 mg, Oral, Every 8 hours PRN   KLOR-CON M20 20 MEQ tablet 20 mEq, Oral, Daily   lipase/protease/amylase (CREON) 36000 UNITS CPEP capsule Take 2 capsules (72,000 Units total) by mouth 3 (three) times daily with meals. May also take 1 capsule (36,000 Units total) as needed (with snacks).   ondansetron (ZOFRAN) 4 mg, Oral, Every 8 hours PRN   Oxycodone HCl 10 mg, Oral, Every 6 hours PRN    Diet Orders (From admission, onward)     Start     Ordered   04/05/23 1251  Diet clear liquid Room service appropriate? Yes; Fluid consistency: Thin  Diet effective now       Question Answer Comment  Room service appropriate? Yes   Fluid consistency: Thin      04/05/23 1250            DVT prophylaxis: SCDs Start: 04/05/23 0156   Lab Results  Component Value Date   PLT 224 04/06/2023  Code Status: Full Code  Family Communication: No family at bedside  Status is: Inpatient Remains inpatient appropriate because: severity of illness  Level of care: Med-Surg  Consultants:  GI  Objective: Vitals:   04/05/23 2354 04/06/23 0500 04/06/23 0544 04/06/23 0747  BP: (!) 145/92  (!) 175/105 (!) 172/104  Pulse: 91  84 86   Resp:    15  Temp: 98.5 F (36.9 C)  98.2 F (36.8 C) 98.2 F (36.8 C)  TempSrc: Oral  Oral Oral  SpO2: 94%  96% 95%  Weight:  80.8 kg    Height:        Intake/Output Summary (Last 24 hours) at 04/06/2023 1333 Last data filed at 04/06/2023 0231 Gross per 24 hour  Intake 4920.58 ml  Output 1 ml  Net 4919.58 ml   Wt Readings from Last 3 Encounters:  04/06/23 80.8 kg  12/27/22 70.7 kg  12/21/22 69 kg    Examination:  Constitutional: NAD Eyes: no scleral icterus ENMT: Mucous membranes are moist.  Neck: normal, supple Respiratory: clear to auscultation bilaterally, no wheezing, no crackles.  Cardiovascular: Regular rate and rhythm, no murmurs / rubs / gallops. No LE edema.  Abdomen: non distended, no tenderness. Bowel sounds positive.  Musculoskeletal: no clubbing / cyanosis.    Data Reviewed: I have independently reviewed following labs and imaging studies   CBC Recent Labs  Lab 04/04/23 1542 04/05/23 0343 04/06/23 0344  WBC 14.4* 16.1* 13.0*  HGB 15.0 14.4 13.3  HCT 46.3* 43.1 40.7  PLT 303 209 224  MCV 83.6 84.3 85.0  MCH 27.1 28.2 27.8  MCHC 32.4 33.4 32.7  RDW 16.8* 16.8* 16.8*  LYMPHSABS 1.8  --   --   MONOABS 0.5  --   --   EOSABS 0.0  --   --   BASOSABS 0.0  --   --     Recent Labs  Lab 04/04/23 1542 04/05/23 0343 04/06/23 0344  NA 136 136 131*  K 3.6 4.4 3.0*  CL 99 100 96*  CO2 25 21* 23  GLUCOSE 131* 142* 100*  BUN 11 11 <5*  CREATININE 0.65 0.54 0.54  CALCIUM 9.2 9.0 8.6*  AST 22 23 16   ALT 14 14 10   ALKPHOS 117 99 72  BILITOT 1.3* 1.7* 1.5*  ALBUMIN 4.0 3.5 3.1*    ------------------------------------------------------------------------------------------------------------------ Recent Labs    04/05/23 1510 04/05/23 1511  CHOL 157  --   HDL 47  --   LDLCALC 94  --   TRIG 78 74  CHOLHDL 3.3  --     Lab Results  Component Value Date   HGBA1C 5.0 07/22/2022    ------------------------------------------------------------------------------------------------------------------ No results for input(s): "TSH", "T4TOTAL", "T3FREE", "THYROIDAB" in the last 72 hours.  Invalid input(s): "FREET3"  Cardiac Enzymes No results for input(s): "CKMB", "TROPONINI", "MYOGLOBIN" in the last 168 hours.  Invalid input(s): "CK" ------------------------------------------------------------------------------------------------------------------    Component Value Date/Time   BNP 42.0 05/12/2022 0730    CBG: No results for input(s): "GLUCAP" in the last 168 hours.  No results found for this or any previous visit (from the past 240 hour(s)).   Radiology Studies: No results found.   Pamella Pert, MD, PhD [facility]  Between 7 am - 7 pm I am available, please contact me via Amion (for emergencies) or Securechat (non urgent messages)  Between 7 pm - 7 am I am not available, please contact night coverage MD/APP via Amion

## 2023-04-06 NOTE — Progress Notes (Addendum)
Attending physician's note   I have taken a history, reviewed the chart, and examined the patient. I performed a substantive portion of this encounter, including complete performance of at least one of the key components, in conjunction with the APP. I agree with the APP's note, impression, and recommendations with my edits.   Overall feels better today.  Tolerating clear liquids.    WBC downtrending, T. bili improving.  Otherwise normal renal function, no hemoconcentration.  Normal TTG.  IgG4 pending.  - Continue clear liquids and slowly advance as tolerated - Continue daily CBC and CMP while inpatient - Can start decreasing IV fluids as p.o. intake improves - Pain control per primary Hospital service - Continue Creon - Inpatient GI service will sign off at this time.  Please do not hesitate to contact us with additional questions or concerns.  Can follow-up with her primary GI at [facility] after discharge  Bejamin Hackbart, DO, FACG [phone] office          Progress Note  Primary GI: [facility]  LOS: 1 day   Chief Complaint: Acute on chronic pancreatitis   Subjective   Patient states she feels somewhat better today.  States every time she has to urinate she feels like it takes pressure off of her abdomen.  She was able to tolerate clear liquids without difficulty but does not want to advance her diet at this time.  She feels the morphine is not doing well enough and feels the Dilaudid does better.  Would like increased pain control   Objective   Vital signs in last 24 hours: Temp:  [97.7 F (36.5 C)-98.5 F (36.9 C)] 98.2 F (36.8 C) (08/31 0747) Pulse Rate:  [84-91] 86 (08/31 0747) Resp:  [14-18] 15 (08/31 0747) BP: (145-177)/(92-105) 172/104 (08/31 0747) SpO2:  [94 %-96 %] 95 % (08/31 0747) Weight:  [80.8 kg] 80.8 kg (08/31 0500) Last BM Date : 04/04/23 Last BM recorded by nurses in past 5 days No data recorded  General:   female in no acute distress Heart:   Regular rate and rhythm; no murmurs Pulm: Clear anteriorly; no wheezing Abdomen: soft, nondistended, normal bowel sounds in all quadrants.  Mild tenderness to epigastrium.  No organomegaly appreciated. Extremities:  No edema Neurologic:  Alert and  oriented x4;  No focal deficits.  Psych:  Cooperative. Normal mood and affect.  Intake/Output from previous day: 08/30 0701 - 08/31 0700 In: 4920.6 [P.O.:120; I.V.:4800.6] Out: 1 [Urine:1] Intake/Output this shift: No intake/output data recorded.  Studies/Results: CT ABDOMEN PELVIS W CONTRAST  Result Date: 04/05/2023 CLINICAL DATA:  Pancreatitis, acute, severe Abdominal pain, acute, nonlocalized. Pt reports she is having a flare of her pancreatitis. Pain 10/10/ Vomiting. EXAM: CT ABDOMEN AND PELVIS WITH CONTRAST TECHNIQUE: Multidetector CT imaging of the abdomen and pelvis was performed using the standard protocol following bolus administration of intravenous contrast. RADIATION DOSE REDUCTION: This exam was performed according to the departmental dose-optimization program which includes automated exposure control, adjustment of the mA and/or kV according to patient size and/or use of iterative reconstruction technique. CONTRAST:  75mL OMNIPAQUE IOHEXOL 350 MG/ML SOLN COMPARISON:  CT abdomen pelvis 08/15/2022 FINDINGS: Lower chest: No acute abnormality. Hepatobiliary: Pancreas: Peripancreatic free fluid and fat stranding. Pancreatic contour hazzy. Stable 2.4 x 3.2 cm mid pancreatic body pseudocyst. Interval development of slightly more distal 1.6 cm pseudocyst. No main pancreatic ductal dilatation. Spleen: Persistent slightly smaller 11.3 x 7.6 cm (from 12.5 x 8.7 cm) subcapsular splenic hematoma. No  new focal lesion. Adrenals/Urinary Tract: No adrenal nodule bilaterally. Bilateral kidneys enhance symmetrically. No hydronephrosis. No hydroureter. The urinary bladder is unremarkable. Stomach/Bowel: Stomach is within normal limits. No evidence of bowel wall  thickening or dilatation. Appendix appears normal. Vascular/Lymphatic: Venous collaterals along the expected main portal vein with previously identified portal vein thrombus not visualized. No abdominal aorta or iliac aneurysm. Mild atherosclerotic plaque of the aorta and its branches. No abdominal, pelvic, or inguinal lymphadenopathy. Reproductive: Uterus and bilateral adnexa are unremarkable. Other: No intraperitoneal free fluid. No intraperitoneal free gas. No organized fluid collection. Musculoskeletal: No abdominal wall hernia or abnormality. No suspicious lytic or blastic osseous lesions. No acute displaced fracture. IMPRESSION: 1. Acute pancreatitis with couple pseudocyst as described above. 2. Persistent slightly smaller 11.3 x 7.6 cm (from 12.5 x 8.7 cm) subcapsular splenic hematoma. 3. Cavernous transformation of the portal vein. Electronically Signed   By: Tish Frederickson M.D.   On: 04/05/2023 00:59    Lab Results: Recent Labs    04/04/23 1542 04/05/23 0343 04/06/23 0344  WBC 14.4* 16.1* 13.0*  HGB 15.0 14.4 13.3  HCT 46.3* 43.1 40.7  PLT 303 209 224   BMET Recent Labs    04/04/23 1542 04/05/23 0343 04/06/23 0344  NA 136 136 131*  K 3.6 4.4 3.0*  CL 99 100 96*  CO2 25 21* 23  GLUCOSE 131* 142* 100*  BUN 11 11 <5*  CREATININE 0.65 0.54 0.54  CALCIUM 9.2 9.0 8.6*   LFT Recent Labs    04/06/23 0344  PROT 6.6  ALBUMIN 3.1*  AST 16  ALT 10  ALKPHOS 72  BILITOT 1.5*   PT/INR No results for input(s): "LABPROT", "INR" in the last 72 hours.   Scheduled Meds:  amLODipine  10 mg Oral Daily   buPROPion  150 mg Oral Daily   cariprazine  1.5 mg Oral Daily   folic acid  1 mg Oral Daily   LORazepam  1 mg Intramuscular Once   multivitamin with minerals  1 tablet Oral Daily   potassium chloride  40 mEq Oral Q3H   thiamine  100 mg Oral Daily   Or   thiamine  100 mg Intravenous Daily   Continuous Infusions:  lactated ringers 250 mL/hr at 04/06/23 0231   lactated  ringers        Patient profile:   37 year old female history of pancreatitis secondary to alcohol use complicated by necrosis, pseudocyst, portal vein thrombosis, splenic hematoma, presents for acute on chronic pancreatitis   Impression:   Acute alcoholic pancreatitis Pancreatic pseudocyst Leukocytosis - CT: Acute pancreatitis, stable 2.4 x 3.2 cm mid pancreatic body pseudocyst. Interval development of more distal 1.6 cm pseudocyst. No PD dilation. Persistent subcapsular splenic hematoma, but improved compared with previous. No bowel wall thickening, obstruction. Cavernous transformation of the portal vein. - Normal renal function - Normal LFTs - Hgb 13.3 (14.4) - WBC 13.0, improving from 16.1 - Triglycerides 74 - IgG4 pending    Plan:   -Continue aggressive IV fluid - Pain control per primary - Can continue clear liquids as tolerated - Trend daily CMP/CBC - Continue Creon  Bayley M McMichael  04/06/2023, 10:23 AM

## 2023-04-06 NOTE — Plan of Care (Signed)

## 2023-04-06 NOTE — Plan of Care (Signed)
Patient AOX4. RA. LR @125 . Able to use call bell independently. Ambulates independently. C/o abd pain throughout shift. See EMAR. Tolerating diet.   Problem: Education: Goal: Knowledge of General Education information will improve Description: Including pain rating scale, medication(s)/side effects and non-pharmacologic comfort measures Outcome: Progressing   Problem: Health Behavior/Discharge Planning: Goal: Ability to manage health-related needs will improve Outcome: Progressing   Problem: Clinical Measurements: Goal: Ability to maintain clinical measurements within normal limits will improve Outcome: Progressing Goal: Will remain free from infection Outcome: Progressing Goal: Diagnostic test results will improve Outcome: Progressing Goal: Respiratory complications will improve Outcome: Progressing Goal: Cardiovascular complication will be avoided Outcome: Progressing   Problem: Activity: Goal: Risk for activity intolerance will decrease Outcome: Progressing   Problem: Nutrition: Goal: Adequate nutrition will be maintained Outcome: Progressing   Problem: Coping: Goal: Level of anxiety will decrease Outcome: Progressing   Problem: Elimination: Goal: Will not experience complications related to bowel motility Outcome: Progressing Goal: Will not experience complications related to urinary retention Outcome: Progressing   Problem: Pain Managment: Goal: General experience of comfort will improve Outcome: Progressing   Problem: Safety: Goal: Ability to remain free from injury will improve Outcome: Progressing   Problem: Skin Integrity: Goal: Risk for impaired skin integrity will decrease Outcome: Progressing

## 2023-04-07 DIAGNOSIS — K852 Alcohol induced acute pancreatitis without necrosis or infection: Secondary | ICD-10-CM | POA: Diagnosis not present

## 2023-04-07 LAB — CBC
HCT: 40.2 % (ref 36.0–46.0)
Hemoglobin: 13.4 g/dL (ref 12.0–15.0)
MCH: 28.3 pg (ref 26.0–34.0)
MCHC: 33.3 g/dL (ref 30.0–36.0)
MCV: 84.8 fL (ref 80.0–100.0)
Platelets: 210 10*3/uL (ref 150–400)
RBC: 4.74 MIL/uL (ref 3.87–5.11)
RDW: 17.1 % — ABNORMAL HIGH (ref 11.5–15.5)
WBC: 7.9 10*3/uL (ref 4.0–10.5)
nRBC: 0 % (ref 0.0–0.2)

## 2023-04-07 LAB — COMPREHENSIVE METABOLIC PANEL
ALT: 10 U/L (ref 0–44)
AST: 17 U/L (ref 15–41)
Albumin: 3.1 g/dL — ABNORMAL LOW (ref 3.5–5.0)
Alkaline Phosphatase: 73 U/L (ref 38–126)
Anion gap: 10 (ref 5–15)
BUN: <5 mg/dL — ABNORMAL LOW (ref 6–20)
CO2: 25 mmol/L (ref 22–32)
Calcium: 8.8 mg/dL — ABNORMAL LOW (ref 8.9–10.3)
Chloride: 97 mmol/L — ABNORMAL LOW (ref 98–111)
Creatinine, Ser: 0.46 mg/dL (ref 0.44–1.00)
GFR, Estimated: >60 mL/min (ref >60)
Glucose, Bld: 100 mg/dL — ABNORMAL HIGH (ref 70–99)
Potassium: 3.2 mmol/L — ABNORMAL LOW (ref 3.5–5.1)
Sodium: 132 mmol/L — ABNORMAL LOW (ref 135–145)
Total Bilirubin: 1.2 mg/dL (ref 0.3–1.2)
Total Protein: 6.6 g/dL (ref 6.5–8.1)

## 2023-04-07 LAB — MAGNESIUM: Magnesium: 1.5 mg/dL — ABNORMAL LOW (ref 1.7–2.4)

## 2023-04-07 MED ORDER — POTASSIUM CHLORIDE CRYS ER 20 MEQ PO TBCR
40.0000 meq | EXTENDED_RELEASE_TABLET | Freq: Once | ORAL | Status: AC
  Administered 2023-04-07: 40 meq via ORAL
  Filled 2023-04-07: qty 2

## 2023-04-07 MED ORDER — MAGNESIUM SULFATE 2 GM/50ML IV SOLN
2.0000 g | Freq: Once | INTRAVENOUS | Status: AC
  Administered 2023-04-07: 2 g via INTRAVENOUS
  Filled 2023-04-07: qty 50

## 2023-04-07 NOTE — Progress Notes (Signed)
PROGRESS NOTE  Jillian Shepherd ZOX:[id number] DOB: 10-Apr-1986 DOA: 04/04/2023 PCP: [name], MD   LOS: 2 days   Brief Narrative / Interim history: 37 y.o. female with medical history significant of history of chronic alcohol use complicated by alcoholic pancreatitis with necrosis and pseudocyst, known occlusive thrombosis of the portal vein/splenic vein, chronic hematuria, essential hypertension and GERD presented to emergency department complaining of pancreatitis flare, nausea and vomiting.  Reports drinking recently   Subjective / 24h Interval events: Feels much better, less nausea, wants to eat more.  Assesement and Plan: Principal Problem:   Acute alcoholic pancreatitis Active Problems:   Essential hypertension   Spleen hematoma   Pancreatic pseudocyst   Cavernous transformation of portal vein   GERD (gastroesophageal reflux disease)   Elevated lipase   Chronic alcohol use   Pseudocyst of pancreas due to acute pancreatitis   Principal problem Acute on chronic pancreatitis, with pseudocysts -CT scan on admission showed evidence of pancreatitis with a mid pancreatic body pseudocyst as well as a more distal 1.  CT scan also showed subcapsular splenic hematoma and cavernous transformation of the portal vein. -Continue fluids, advance diet to full liquids today, continue pain control  Active problems Chronic alcohol use -encouraged complete cessation  Essential hypertension-continue amlodipine  GAD-continue BuSpar, antianxiety medications  Obesity, class I-BMI borderline, 30.5  Hypokalemia-replace potassium today.  Check magnesium  Hyponatremia-continue fluids  Leukocytosis-likely reactive, monitor, improving  Scheduled Meds:  amLODipine  10 mg Oral Daily   buPROPion  150 mg Oral Daily   cariprazine  1.5 mg Oral Daily   folic acid  1 mg Oral Daily   LORazepam  1 mg Intramuscular Once   multivitamin with minerals  1 tablet Oral Daily   thiamine  100 mg Oral  Daily   Or   thiamine  100 mg Intravenous Daily   Continuous Infusions:   PRN Meds:.acetaminophen **OR** acetaminophen, hydrALAZINE, HYDROmorphone (DILAUDID) injection, hydrOXYzine, LORazepam **OR** LORazepam, morphine injection, ondansetron (ZOFRAN) IV, oxyCODONE, senna-docusate  Current Outpatient Medications  Medication Instructions   amitriptyline (ELAVIL) 50 mg, Oral, At bedtime PRN   amLODipine (NORVASC) 10 mg, Oral, Daily   buPROPion (WELLBUTRIN XL) 150 mg, Oral, Daily   cariprazine (VRAYLAR) 1.5 mg, Oral, Daily   cyanocobalamin (VITAMIN B12) 1,000 mcg, Intramuscular, Every 30 days   folic acid (FOLVITE) 1 mg, Oral, Daily   hydrOXYzine (VISTARIL) 25 mg, Oral, Every 8 hours PRN   KLOR-CON M20 20 MEQ tablet 20 mEq, Oral, Daily   lipase/protease/amylase (CREON) 36000 UNITS CPEP capsule Take 2 capsules (72,000 Units total) by mouth 3 (three) times daily with meals. May also take 1 capsule (36,000 Units total) as needed (with snacks).   ondansetron (ZOFRAN) 4 mg, Oral, Every 8 hours PRN   Oxycodone HCl 10 mg, Oral, Every 6 hours PRN    Diet Orders (From admission, onward)     Start     Ordered   04/07/23 0840  Diet full liquid Room service appropriate? Yes; Fluid consistency: Thin  Diet effective now       Question Answer Comment  Room service appropriate? Yes   Fluid consistency: Thin      04/07/23 0839            DVT prophylaxis: SCDs Start: 04/05/23 0156   Lab Results  Component Value Date   PLT 210 04/07/2023      Code Status: Full Code  Family Communication: No family at bedside  Status is: Inpatient Remains inpatient appropriate because:  severity of illness  Level of care: Med-Surg  Consultants:  GI  Objective: Vitals:   04/06/23 2210 04/07/23 0238 04/07/23 0437 04/07/23 0737  BP: (!) 156/100 (!) 147/105 (!) 147/98 (!) 158/104  Pulse: 91 94 97 92  Resp: 18  18 18   Temp:  98.2 F (36.8 C) 98.4 F (36.9 C) 98.1 F (36.7 C)  TempSrc:  Oral  Oral Oral  SpO2:  93% 96% 98%  Weight:      Height:        Intake/Output Summary (Last 24 hours) at 04/07/2023 1025 Last data filed at 04/07/2023 0500 Gross per 24 hour  Intake 2258.72 ml  Output --  Net 2258.72 ml   Wt Readings from Last 3 Encounters:  04/06/23 80.8 kg  12/27/22 70.7 kg  12/21/22 69 kg    Examination:  Constitutional: NAD Eyes: lids and conjunctivae normal, no scleral icterus ENMT: mmm Neck: normal, supple Respiratory: clear to auscultation bilaterally, no wheezing, no crackles. Normal respiratory effort.  Cardiovascular: Regular rate and rhythm, no murmurs / rubs / gallops. No LE edema. Abdomen: soft, no distention, no tenderness. Bowel sounds positive.  Skin: no rashes   Data Reviewed: I have independently reviewed following labs and imaging studies   CBC Recent Labs  Lab 04/04/23 1542 04/05/23 0343 04/06/23 0344 04/07/23 0251  WBC 14.4* 16.1* 13.0* 7.9  HGB 15.0 14.4 13.3 13.4  HCT 46.3* 43.1 40.7 40.2  PLT 303 209 224 210  MCV 83.6 84.3 85.0 84.8  MCH 27.1 28.2 27.8 28.3  MCHC 32.4 33.4 32.7 33.3  RDW 16.8* 16.8* 16.8* 17.1*  LYMPHSABS 1.8  --   --   --   MONOABS 0.5  --   --   --   EOSABS 0.0  --   --   --   BASOSABS 0.0  --   --   --     Recent Labs  Lab 04/04/23 1542 04/05/23 0343 04/06/23 0344 04/07/23 0251  NA 136 136 131* 132*  K 3.6 4.4 3.0* 3.2*  CL 99 100 96* 97*  CO2 25 21* 23 25  GLUCOSE 131* 142* 100* 100*  BUN 11 11 <5* <5*  CREATININE 0.65 0.54 0.54 0.46  CALCIUM 9.2 9.0 8.6* 8.8*  AST 22 23 16 17   ALT 14 14 10 10   ALKPHOS 117 99 72 73  BILITOT 1.3* 1.7* 1.5* 1.2  ALBUMIN 4.0 3.5 3.1* 3.1*  MG  --   --   --  1.5*    ------------------------------------------------------------------------------------------------------------------ Recent Labs    04/05/23 1510 04/05/23 1511  CHOL 157  --   HDL 47  --   LDLCALC 94  --   TRIG 78 74  CHOLHDL 3.3  --     Lab Results  Component Value Date   HGBA1C 5.0  07/22/2022   ------------------------------------------------------------------------------------------------------------------ No results for input(s): "TSH", "T4TOTAL", "T3FREE", "THYROIDAB" in the last 72 hours.  Invalid input(s): "FREET3"  Cardiac Enzymes No results for input(s): "CKMB", "TROPONINI", "MYOGLOBIN" in the last 168 hours.  Invalid input(s): "CK" ------------------------------------------------------------------------------------------------------------------    Component Value Date/Time   BNP 42.0 05/12/2022 0730    CBG: No results for input(s): "GLUCAP" in the last 168 hours.  No results found for this or any previous visit (from the past 240 hour(s)).   Radiology Studies: No results found.   Pamella Pert, MD, PhD [facility]  Between 7 am - 7 pm I am available, please contact me via Amion (for emergencies) or Securechat (non  urgent messages)  Between 7 pm - 7 am I am not available, please contact night coverage MD/APP via Amion

## 2023-04-08 DIAGNOSIS — K852 Alcohol induced acute pancreatitis without necrosis or infection: Secondary | ICD-10-CM | POA: Diagnosis not present

## 2023-04-08 MED ORDER — OXYCODONE HCL 10 MG PO TABS: 10.0000 mg | ORAL_TABLET | Freq: Four times a day (QID) | ORAL | 0 refills | Status: DC | PRN

## 2023-04-08 MED ORDER — ONDANSETRON HCL 4 MG PO TABS: 4.0000 mg | ORAL_TABLET | Freq: Three times a day (TID) | ORAL | 0 refills | Status: DC | PRN

## 2023-04-08 NOTE — Plan of Care (Signed)
  Problem: Education: Goal: Knowledge of General Education information will improve Description: Including pain rating scale, medication(s)/side effects and non-pharmacologic comfort measures Outcome: Progressing   Problem: Activity: Goal: Risk for activity intolerance will decrease Outcome: Progressing   Problem: Pain Managment: Goal: General experience of comfort will improve Outcome: Progressing   

## 2023-04-08 NOTE — Discharge Summary (Signed)
Physician Discharge Summary  Jillian Shepherd ONG:[id number] DOB: 03-02-1986 DOA: 04/04/2023  PCP: [name], MD  Admit date: 04/04/2023 Discharge date: 04/08/2023  Admitted From: home Disposition:  home  Recommendations for Outpatient Follow-up:  Follow up with PCP in 1-2 weeks Follow up with primary GI as an outpatient  Home Health: none Equipment/Devices: none  Discharge Condition: stable CODE STATUS: Full code  HPI: Per admitting MD, Jillian Shepherd is a 37 y.o. female with medical history significant of history of chronic alcohol use complicated by alcoholic pancreatitis with necrosis and pseudocyst, known occlusive thrombosis of the portal vein/splenic vein, chronic hematuria, essential hypertension and GERD presented to emergency department complaining of pancreatitis flare, nausea and vomiting. Patient is reporting that she is having midepigastric abdominal pain that has been started earlier this morning 04/04/2023.  The pain is constant 8 out of 10 intensity with associated nausea and vomiting.  Patient denies any fever and chills.  Denies any chest pain, shortness of breath breath, cough, palpitation, constipation and diarrhea. Patient reported to ED provider that currently she has been drinking 3 to 4 cans of beer in a day however when I asked about drinking alcohol patient is avoiding eye contact with me and saying that she has been sober and not drinking alcohol. Has been evaluated by outpatient gastroenterology in January 2024 at [facility] gastroenterology associate.  During the clinic visit patient has been given prescription of Creon.  After that patient followed up with gastroenterology with [facility] in February 2024 by Dr. Odessa Fleming.  Hospital Course / Discharge diagnoses: Principal Problem:   Acute alcoholic pancreatitis Active Problems:   Essential hypertension   Spleen hematoma   Pancreatic pseudocyst   Cavernous transformation of portal vein   GERD  (gastroesophageal reflux disease)   Elevated lipase   Chronic alcohol use   Pseudocyst of pancreas due to acute pancreatitis   Principal problem Acute on chronic pancreatitis, with pseudocysts -CT scan on admission showed evidence of pancreatitis with a mid pancreatic body pseudocyst as well as a more distal 1.  CT scan also showed subcapsular splenic hematoma and cavernous transformation of the portal vein.  Gastroenterology consulted and followed patient while hospitalized, for now recommending conservative management.  She was initially n.p.o. and her diet was slowly advanced, maintained on fluids, pain control, antiemetics.  With conservative treatment she has improved, able to tolerate a regular diet and will be discharged home in stable condition.  Active problems Chronic alcohol use -encouraged complete cessation Essential hypertension-continue amlodipine GAD-continue BuSpar, antianxiety medications Obesity, class I-BMI borderline, 30.5 Hypokalemia-K replaced Hyponatremia-stable Leukocytosis-likely reactive, resolved    Sepsis ruled out   Discharge Instructions   Allergies as of 04/08/2023   No Known Allergies      Medication List     TAKE these medications    amitriptyline 50 MG tablet Commonly known as: ELAVIL Take 50 mg by mouth at bedtime as needed for sleep.   amLODipine 10 MG tablet Commonly known as: NORVASC Take 1 tablet (10 mg total) by mouth daily.   buPROPion 150 MG 24 hr tablet Commonly known as: Wellbutrin XL Take 1 tablet (150 mg total) by mouth daily.   cariprazine 1.5 MG capsule Commonly known as: Vraylar Take 1 capsule (1.5 mg total) by mouth daily.   cyanocobalamin 1000 MCG/ML injection Commonly known as: VITAMIN B12 Inject 1 mL (1,000 mcg total) into the muscle every 30 (thirty) days.   folic acid 1 MG tablet Commonly known as: FOLVITE Take  1 tablet (1 mg total) by mouth daily.   hydrOXYzine 25 MG capsule Commonly known as:  VISTARIL Take 1 capsule (25 mg total) by mouth every 8 (eight) hours as needed (for sleep).   Klor-Con M20 20 MEQ tablet Generic drug: potassium chloride SA Take 20 mEq by mouth daily.   lipase/protease/amylase 28413 UNITS Cpep capsule Commonly known as: Creon Take 2 capsules (72,000 Units total) by mouth 3 (three) times daily with meals. May also take 1 capsule (36,000 Units total) as needed (with snacks).   ondansetron 4 MG tablet Commonly known as: Zofran Take 1 tablet (4 mg total) by mouth every 8 (eight) hours as needed for nausea or vomiting.   Oxycodone HCl 10 MG Tabs Take 1 tablet (10 mg total) by mouth every 6 (six) hours as needed (breakthrough pain only).        Follow-up Information     [name], MD Follow up in 1 week(s).   Specialty: Family Medicine Contact information: [address] [place] [address] [phone]                 Consultations: GI  Procedures/Studies:  CT ABDOMEN PELVIS W CONTRAST  Result Date: 04/05/2023 CLINICAL DATA:  Pancreatitis, acute, severe Abdominal pain, acute, nonlocalized. Pt reports she is having a flare of her pancreatitis. Pain 10/10/ Vomiting. EXAM: CT ABDOMEN AND PELVIS WITH CONTRAST TECHNIQUE: Multidetector CT imaging of the abdomen and pelvis was performed using the standard protocol following bolus administration of intravenous contrast. RADIATION DOSE REDUCTION: This exam was performed according to the departmental dose-optimization program which includes automated exposure control, adjustment of the mA and/or kV according to patient size and/or use of iterative reconstruction technique. CONTRAST:  75mL OMNIPAQUE IOHEXOL 350 MG/ML SOLN COMPARISON:  CT abdomen pelvis 08/15/2022 FINDINGS: Lower chest: No acute abnormality. Hepatobiliary: Pancreas: Peripancreatic free fluid and fat stranding. Pancreatic contour hazzy. Stable 2.4 x 3.2 cm mid pancreatic body pseudocyst. Interval development of slightly  more distal 1.6 cm pseudocyst. No main pancreatic ductal dilatation. Spleen: Persistent slightly smaller 11.3 x 7.6 cm (from 12.5 x 8.7 cm) subcapsular splenic hematoma. No new focal lesion. Adrenals/Urinary Tract: No adrenal nodule bilaterally. Bilateral kidneys enhance symmetrically. No hydronephrosis. No hydroureter. The urinary bladder is unremarkable. Stomach/Bowel: Stomach is within normal limits. No evidence of bowel wall thickening or dilatation. Appendix appears normal. Vascular/Lymphatic: Venous collaterals along the expected main portal vein with previously identified portal vein thrombus not visualized. No abdominal aorta or iliac aneurysm. Mild atherosclerotic plaque of the aorta and its branches. No abdominal, pelvic, or inguinal lymphadenopathy. Reproductive: Uterus and bilateral adnexa are unremarkable. Other: No intraperitoneal free fluid. No intraperitoneal free gas. No organized fluid collection. Musculoskeletal: No abdominal wall hernia or abnormality. No suspicious lytic or blastic osseous lesions. No acute displaced fracture. IMPRESSION: 1. Acute pancreatitis with couple pseudocyst as described above. 2. Persistent slightly smaller 11.3 x 7.6 cm (from 12.5 x 8.7 cm) subcapsular splenic hematoma. 3. Cavernous transformation of the portal vein. Electronically Signed   By: Tish Frederickson M.D.   On: 04/05/2023 00:59     Subjective: - no chest pain, shortness of breath, no abdominal pain, nausea or vomiting.   Discharge Exam: BP 112/83 (BP Location: Left Arm)   Pulse 80   Temp 98 F (36.7 C) (Oral)   Resp 16   Ht 5\' 4"  (1.626 m)   Wt 78.9 kg   SpO2 92%   BMI 29.86 kg/m   General: Pt is  alert, awake, not in acute distress Cardiovascular: RRR, S1/S2 +, no rubs, no gallops Respiratory: CTA bilaterally, no wheezing, no rhonchi Abdominal: Soft, NT, ND, bowel sounds + Extremities: no edema, no cyanosis   The results of significant diagnostics from this hospitalization (including  imaging, microbiology, ancillary and laboratory) are listed below for reference.     Microbiology: No results found for this or any previous visit (from the past 240 hour(s)).   Labs: Basic Metabolic Panel: Recent Labs  Lab 04/04/23 1542 04/05/23 0343 04/06/23 0344 04/07/23 0251  NA 136 136 131* 132*  K 3.6 4.4 3.0* 3.2*  CL 99 100 96* 97*  CO2 25 21* 23 25  GLUCOSE 131* 142* 100* 100*  BUN 11 11 <5* <5*  CREATININE 0.65 0.54 0.54 0.46  CALCIUM 9.2 9.0 8.6* 8.8*  MG  --   --   --  1.5*   Liver Function Tests: Recent Labs  Lab 04/04/23 1542 04/05/23 0343 04/06/23 0344 04/07/23 0251  AST 22 23 16 17   ALT 14 14 10 10   ALKPHOS 117 99 72 73  BILITOT 1.3* 1.7* 1.5* 1.2  PROT 8.0 7.3 6.6 6.6  ALBUMIN 4.0 3.5 3.1* 3.1*   CBC: Recent Labs  Lab 04/04/23 1542 04/05/23 0343 04/06/23 0344 04/07/23 0251  WBC 14.4* 16.1* 13.0* 7.9  NEUTROABS 12.0*  --   --   --   HGB 15.0 14.4 13.3 13.4  HCT 46.3* 43.1 40.7 40.2  MCV 83.6 84.3 85.0 84.8  PLT 303 209 224 210   CBG: No results for input(s): "GLUCAP" in the last 168 hours. Hgb A1c No results for input(s): "HGBA1C" in the last 72 hours. Lipid Profile Recent Labs    04/05/23 1510 04/05/23 1511  CHOL 157  --   HDL 47  --   LDLCALC 94  --   TRIG 78 74  CHOLHDL 3.3  --    Thyroid function studies No results for input(s): "TSH", "T4TOTAL", "T3FREE", "THYROIDAB" in the last 72 hours.  Invalid input(s): "FREET3" Urinalysis    Component Value Date/Time   COLORURINE AMBER (A) 04/04/2023 1535   APPEARANCEUR CLOUDY (A) 04/04/2023 1535   LABSPEC 1.021 04/04/2023 1535   PHURINE 6.0 04/04/2023 1535   GLUCOSEU NEGATIVE 04/04/2023 1535   HGBUR NEGATIVE 04/04/2023 1535   BILIRUBINUR SMALL (A) 04/04/2023 1535   KETONESUR 80 (A) 04/04/2023 1535   PROTEINUR 100 (A) 04/04/2023 1535   UROBILINOGEN 0.2 09/28/2007 0450   NITRITE NEGATIVE 04/04/2023 1535   LEUKOCYTESUR TRACE (A) 04/04/2023 1535    FURTHER DISCHARGE  INSTRUCTIONS:   Get Medicines reviewed and adjusted: Please take all your medications with you for your next visit with your Primary MD   Laboratory/radiological data: Please request your Primary MD to go over all hospital tests and procedure/radiological results at the follow up, please ask your Primary MD to get all Hospital records sent to his/her office.   In some cases, they will be blood work, cultures and biopsy results pending at the time of your discharge. Please request that your primary care M.D. goes through all the records of your hospital data and follows up on these results.   Also Note the following: If you experience worsening of your admission symptoms, develop shortness of breath, life threatening emergency, suicidal or homicidal thoughts you must seek medical attention immediately by calling 911 or calling your MD immediately  if symptoms less severe.   You must read complete instructions/literature along with all the possible adverse reactions/side effects  for all the Medicines you take and that have been prescribed to you. Take any new Medicines after you have completely understood and accpet all the possible adverse reactions/side effects.    Do not drive when taking Pain medications or sleeping medications (Benzodaizepines)   Do not take more than prescribed Pain, Sleep and Anxiety Medications. It is not advisable to combine anxiety,sleep and pain medications without talking with your primary care practitioner   Special Instructions: If you have smoked or chewed Tobacco  in the last 2 yrs please stop smoking, stop any regular Alcohol  and or any Recreational drug use.   Wear Seat belts while driving.   Please note: You were cared for by a hospitalist during your hospital stay. Once you are discharged, your primary care physician will handle any further medical issues. Please note that NO REFILLS for any discharge medications will be authorized once you are discharged,  as it is imperative that you return to your primary care physician (or establish a relationship with a primary care physician if you do not have one) for your post hospital discharge needs so that they can reassess your need for medications and monitor your lab values.  Time coordinating discharge: 35 minutes  SIGNED:  Pamella Pert, MD, PhD 04/08/2023, 7:39 AM

## 2023-04-27 ENCOUNTER — Other Ambulatory Visit: Payer: Self-pay | Admitting: Family Medicine

## 2023-04-29 NOTE — Telephone Encounter (Signed)
Last OV 12/27/22 within protocol.  Requested Prescriptions  Pending Prescriptions Disp Refills   cyanocobalamin (VITAMIN B12) 1000 MCG/ML injection [Pharmacy Med Name: CYANOCOBALAMIN 1,000 MCG/ML VL] 1 mL 2    Sig: INJECT 1 ML (1,000 MCG TOTAL) INTO THE MUSCLE EVERY 30 DAYS.     Endocrinology:  Vitamins - Vitamin B12 Failed - 04/27/2023  8:23 AM      Failed - Valid encounter within last 12 months    Recent Outpatient Visits           2 years ago Hypokalemia   [facility] [name], MD   2 years ago Other secondary hypertension   [facility] Tanya Nones, Priscille Heidelberg, MD   2 years ago Epigastric pain   [facility] Tanya Nones, Priscille Heidelberg, MD   3 years ago Epigastric pain   [facility] Tanya Nones, Priscille Heidelberg, MD   3 years ago Right forearm pain   [facility] [place], Velna Hatchet, MD              Passed - HCT in normal range and within 360 days    HCT  Date Value Ref Range Status  04/07/2023 40.2 36.0 - 46.0 % Final         Passed - HGB in normal range and within 360 days    Hemoglobin  Date Value Ref Range Status  04/07/2023 13.4 12.0 - 15.0 g/dL Final         Passed - B12 Level in normal range and within 360 days    Vitamin B-12  Date Value Ref Range Status  08/15/2022 186 180 - 914 pg/mL Final    Comment:    (NOTE) This assay is not validated for testing neonatal or myeloproliferative syndrome specimens for Vitamin B12 levels. Performed at [facility], [address], [place], [place] [address]

## 2023-05-01 ENCOUNTER — Telehealth: Payer: Self-pay | Admitting: Family Medicine

## 2023-05-01 NOTE — Telephone Encounter (Signed)
Prescription Request  05/01/2023  LOV: 12/27/2022  What is the name of the medication or equipment?   Oxycodone HCl 10 MG TABS   Have you contacted your pharmacy to request a refill? Yes   Which pharmacy would you like this sent to?  [id number] - [place], [place] - [address] AT [facility] [address] [place] [address] Phone: [phone] Fax: [phone]    Patient notified that their request is being sent to the clinical staff for review and that they should receive a response within 2 business days.   Please advise patient at [phone].

## 2023-05-02 ENCOUNTER — Encounter: Payer: Self-pay | Admitting: Family Medicine

## 2023-05-02 ENCOUNTER — Other Ambulatory Visit: Payer: Self-pay | Admitting: Family Medicine

## 2023-05-02 MED ORDER — OXYCODONE HCL 10 MG PO TABS: 10.0000 mg | ORAL_TABLET | Freq: Four times a day (QID) | ORAL | 0 refills | Status: DC | PRN

## 2023-05-17 ENCOUNTER — Ambulatory Visit (INDEPENDENT_AMBULATORY_CARE_PROVIDER_SITE_OTHER): Payer: 59 | Admitting: Family Medicine

## 2023-05-17 ENCOUNTER — Encounter: Payer: Self-pay | Admitting: Family Medicine

## 2023-05-17 VITALS — BP 120/76 | HR 130 | Temp 98.4°F | Ht 64.0 in | Wt 160.8 lb

## 2023-05-17 DIAGNOSIS — R000 Tachycardia, unspecified: Secondary | ICD-10-CM | POA: Diagnosis not present

## 2023-05-17 DIAGNOSIS — K852 Alcohol induced acute pancreatitis without necrosis or infection: Secondary | ICD-10-CM | POA: Diagnosis not present

## 2023-05-17 MED ORDER — OXYCODONE HCL 5 MG PO TABS
5.0000 mg | ORAL_TABLET | Freq: Three times a day (TID) | ORAL | 0 refills | Status: DC
Start: 2023-05-17 — End: 2023-05-31

## 2023-05-17 NOTE — Progress Notes (Signed)
Subjective:    Patient ID: Jillian Shepherd, female    DOB: 1985/11/15, 37 y.o.   MRN: [id number]  Since I last saw the patient, she was readmitted to the hospital.  Patient had a recurrent episode of alcohol induced pancreatitis.  Patient states that she had been abstinent from alcohol for several months.  However her husband told her that he wanted her to leave.  He has given her to the end of the month to leave the home.  Their marriage is over.  She had a breakdown and started drinking.  She developed abdominal pain and subsequently was admitted to the hospital.  She is here today requesting a refill on her pain medication.  She reports pain in the left upper quadrant.  She was found to have a subcapsular hematoma on her spleen as well as a cyst on her pancreas.  She is following up with GI at [facility] regarding this.  She is still taking Creon.  She reports nausea with eating.  She reports abdominal pain on a daily basis.  She states that she is having to take 210 mg pills 4 times a day.  However she states that she is not drinking now.  She denies depression or suicidal ideation Past Medical History:  Diagnosis Date   Abdominal pain    Alcohol abuse    B12 deficiency    Cancer (HCC)    squmaous cell skin cancer    Collagen vascular disease (HCC)    Hypokalemia    Liddle's syndrome    Obesity (BMI 35.0-39.9 without comorbidity)    Pancreatitis    Pleural effusion    Splenic hemorrhage    Past Surgical History:  Procedure Laterality Date   ADENOIDECTOMY     CHEST TUBE INSERTION     COLONOSCOPY WITH PROPOFOL N/A 10/06/2020   Procedure: COLONOSCOPY WITH PROPOFOL;  Surgeon: Toney Reil, MD;  Location: [facility] ENDOSCOPY;  Service: Gastroenterology;  Laterality: N/A;  COVID POSITIVE 08/23/2020   ESOPHAGOGASTRODUODENOSCOPY (EGD) WITH PROPOFOL N/A 10/06/2020   Procedure: ESOPHAGOGASTRODUODENOSCOPY (EGD) WITH PROPOFOL;  Surgeon: Toney Reil, MD;  Location: [facility] ENDOSCOPY;  Service:  Gastroenterology;  Laterality: N/A;   skin cancer removal Left    leg   TONSILLECTOMY     Current Outpatient Medications on File Prior to Visit  Medication Sig Dispense Refill   amitriptyline (ELAVIL) 50 MG tablet Take 50 mg by mouth at bedtime as needed for sleep.     amLODipine (NORVASC) 10 MG tablet Take 1 tablet (10 mg total) by mouth daily. 90 tablet 3   buPROPion (WELLBUTRIN XL) 150 MG 24 hr tablet Take 1 tablet (150 mg total) by mouth daily. 30 tablet 5   cariprazine (VRAYLAR) 1.5 MG capsule Take 1 capsule (1.5 mg total) by mouth daily. 30 capsule 1   cyanocobalamin (VITAMIN B12) 1000 MCG/ML injection INJECT 1 ML (1,000 MCG TOTAL) INTO THE MUSCLE EVERY 30 DAYS. 1 mL 2   hydrOXYzine (VISTARIL) 25 MG capsule Take 1 capsule (25 mg total) by mouth every 8 (eight) hours as needed (for sleep). 30 capsule 0   KLOR-CON M20 20 MEQ tablet Take 20 mEq by mouth daily.     lipase/protease/amylase (CREON) 36000 UNITS CPEP capsule Take 2 capsules (72,000 Units total) by mouth 3 (three) times daily with meals. May also take 1 capsule (36,000 Units total) as needed (with snacks). 240 capsule 11   ondansetron (ZOFRAN) 4 MG tablet Take 1 tablet (4 mg total) by mouth every 8 (  eight) hours as needed for nausea or vomiting. 20 tablet 0   Oxycodone HCl 10 MG TABS Take 1 tablet (10 mg total) by mouth every 6 (six) hours as needed (breakthrough pain only). 60 tablet 0   No current facility-administered medications on file prior to visit.   No Known Allergies Social History   Socioeconomic History   Marital status: Married    Spouse name: Not on file   Number of children: Not on file   Years of education: Not on file   Highest education level: Not on file  Occupational History   Not on file  Tobacco Use   Smoking status: Every Day    Current packs/day: 0.25    Average packs/day: 0.3 packs/day for 9.0 years (2.3 ttl pk-yrs)    Types: Cigarettes    Passive exposure: Current   Smokeless tobacco: Never   Vaping Use   Vaping status: Never Used  Substance and Sexual Activity   Alcohol use: Yes    Alcohol/week: 2.0 standard drinks of alcohol    Types: 2 Cans of beer per week   Drug use: No   Sexual activity: Yes    Birth control/protection: None  Other Topics Concern   Not on file  Social History Narrative   ** Merged History Encounter **       Social Determinants of Health   Financial Resource Strain: Not on file  Food Insecurity: No Food Insecurity (04/05/2023)   Hunger Vital Sign    Worried About Running Out of Food in the Last Year: Never true    Ran Out of Food in the Last Year: Never true  Transportation Needs: No Transportation Needs (04/05/2023)   PRAPARE - Administrator, Civil Service (Medical): No    Lack of Transportation (Non-Medical): No  Physical Activity: Not on file  Stress: Not on file  Social Connections: Unknown (12/19/2021)   Received from [facility]   Social Network    Social Network: Not on file  Intimate Partner Violence: Not At Risk (04/05/2023)   Humiliation, Afraid, Rape, and Kick questionnaire    Fear of Current or Ex-Partner: No    Emotionally Abused: No    Physically Abused: No    Sexually Abused: No     Review of Systems  All other systems reviewed and are negative.      Objective:   Physical Exam Vitals reviewed.  Constitutional:      General: She is not in acute distress.    Appearance: Normal appearance. She is normal weight. She is not ill-appearing, toxic-appearing or diaphoretic.  HENT:     Nose: Nose normal. No congestion or rhinorrhea.     Mouth/Throat:     Pharynx: Oropharynx is clear. No oropharyngeal exudate or posterior oropharyngeal erythema.  Eyes:     General: No scleral icterus.    Conjunctiva/sclera: Conjunctivae normal.  Cardiovascular:     Rate and Rhythm: Normal rate and regular rhythm.     Pulses: Normal pulses.     Heart sounds: Normal heart sounds. No murmur heard.    No  friction rub. No gallop.  Pulmonary:     Effort: Pulmonary effort is normal. No respiratory distress.     Breath sounds: Normal breath sounds. No stridor. No wheezing, rhonchi or rales.  Chest:     Chest wall: No tenderness.  Abdominal:     General: Abdomen is flat. Bowel sounds are normal. There is no distension.  Palpations: Abdomen is soft. There is no mass.     Tenderness: There is abdominal tenderness. There is no right CVA tenderness, left CVA tenderness, guarding or rebound.     Hernia: No hernia is present.  Musculoskeletal:     Cervical back: Neck supple.  Neurological:     Mental Status: She is alert.           Assessment & Plan:  Alcohol-induced acute pancreatitis, unspecified complication status - Plan: CBC with Differential/Platelet, COMPLETE METABOLIC PANEL WITH GFR, Lipid panel, Hemoglobin A1c, Protein / Creatinine Ratio, Urine, TSH  Tachycardia - Plan: TSH Patient is extremely tachycardic today.  I will check a TSH to evaluate for hyperthyroidism.  I will check a CMP to evaluate for any electrolyte disturbances or dehydration.  I will check a CBC to evaluate for anemia.  Patient states that she feels like it could be due to her anxiety.  I told the patient that if she continues to drink alcohol she is going to die.  I also told her that it is impossible for her to stop her alcoholism without help.  I strongly recommended AA or another treatment program.  She declines this today.  I am going to wean the patient off pain medication.  I told the patient that I did not want to simply enable her to drink by masking the pain in her pancreas.  Therefore I will decrease oxycodone from 10 mg to 5 mg.  I will give her 3 tablets a day/90/month and I plan to continue to wean that down every month until the patient is off pain medication altogether.  My heart goes out for the situation that she is in.  However her prognosis is very guarded unless she seeks outpatient treatment for her  alcoholism.  Meanwhile check A1c, lipid panel CMP, and urine protein creatinine ratio given her borderline diabetes mellitus

## 2023-05-18 LAB — COMPLETE METABOLIC PANEL WITH GFR
AG Ratio: 1.1 (calc) (ref 1.0–2.5)
ALT: 23 U/L (ref 6–29)
AST: 36 U/L — ABNORMAL HIGH (ref 10–30)
Albumin: 4.4 g/dL (ref 3.6–5.1)
Alkaline phosphatase (APISO): 189 U/L — ABNORMAL HIGH (ref 31–125)
BUN/Creatinine Ratio: 7 (calc) (ref 6–22)
BUN: 4 mg/dL — ABNORMAL LOW (ref 7–25)
CO2: 23 mmol/L (ref 20–32)
Calcium: 9.5 mg/dL (ref 8.6–10.2)
Chloride: 95 mmol/L — ABNORMAL LOW (ref 98–110)
Creat: 0.59 mg/dL (ref 0.50–0.97)
Globulin: 4 g/dL — ABNORMAL HIGH (ref 1.9–3.7)
Glucose, Bld: 521 mg/dL (ref 65–99)
Potassium: 4.2 mmol/L (ref 3.5–5.3)
Sodium: 134 mmol/L — ABNORMAL LOW (ref 135–146)
Total Bilirubin: 0.4 mg/dL (ref 0.2–1.2)
Total Protein: 8.4 g/dL — ABNORMAL HIGH (ref 6.1–8.1)
eGFR: 118 mL/min/{1.73_m2} (ref >=60)

## 2023-05-18 LAB — HEMOGLOBIN A1C
Hgb A1c MFr Bld: 10.3 %{Hb} — ABNORMAL HIGH (ref <5.7)
Mean Plasma Glucose: 249 mg/dL
eAG (mmol/L): 13.8 mmol/L

## 2023-05-18 LAB — CBC WITH DIFFERENTIAL/PLATELET
Absolute Monocytes: 545 {cells}/uL (ref 200–950)
Basophils Absolute: 66 {cells}/uL (ref 0–200)
Basophils Relative: 0.7 %
Eosinophils Absolute: 160 {cells}/uL (ref 15–500)
Eosinophils Relative: 1.7 %
HCT: 51.8 % — ABNORMAL HIGH (ref 35.0–45.0)
Hemoglobin: 16.5 g/dL — ABNORMAL HIGH (ref 11.7–15.5)
Lymphs Abs: 3036 {cells}/uL (ref 850–3900)
MCH: 29.2 pg (ref 27.0–33.0)
MCHC: 31.9 g/dL — ABNORMAL LOW (ref 32.0–36.0)
MCV: 91.7 fL (ref 80.0–100.0)
MPV: 10.3 fL (ref 7.5–12.5)
Monocytes Relative: 5.8 %
Neutro Abs: 5593 {cells}/uL (ref 1500–7800)
Neutrophils Relative %: 59.5 %
Platelets: 313 10*3/uL (ref 140–400)
RBC: 5.65 10*6/uL — ABNORMAL HIGH (ref 3.80–5.10)
RDW: 19.7 % — ABNORMAL HIGH (ref 11.0–15.0)
Total Lymphocyte: 32.3 %
WBC: 9.4 10*3/uL (ref 3.8–10.8)

## 2023-05-18 LAB — PROTEIN / CREATININE RATIO, URINE
Creatinine, Urine: 17 mg/dL — ABNORMAL LOW (ref 20–275)
Protein/Creat Ratio: 471 mg/g{creat} — ABNORMAL HIGH (ref 24–184)
Protein/Creatinine Ratio: 0.471 mg/mg{creat} — ABNORMAL HIGH (ref 0.024–0.184)
Total Protein, Urine: 8 mg/dL (ref 5–24)

## 2023-05-18 LAB — LIPID PANEL
Cholesterol: 188 mg/dL (ref <200)
HDL: 48 mg/dL — ABNORMAL LOW (ref >=50)
LDL Cholesterol (Calc): 112 mg/dL — ABNORMAL HIGH
Non-HDL Cholesterol (Calc): 140 mg/dL — ABNORMAL HIGH (ref <130)
Total CHOL/HDL Ratio: 3.9 (calc) (ref <5.0)
Triglycerides: 164 mg/dL — ABNORMAL HIGH (ref <150)

## 2023-05-18 LAB — TSH: TSH: 0.69 m[IU]/L

## 2023-05-23 ENCOUNTER — Other Ambulatory Visit: Payer: Self-pay

## 2023-05-23 DIAGNOSIS — K861 Other chronic pancreatitis: Secondary | ICD-10-CM

## 2023-05-23 MED ORDER — PIOGLITAZONE HCL 30 MG PO TABS
30.0000 mg | ORAL_TABLET | Freq: Every day | ORAL | 1 refills | Status: DC
Start: 2023-05-23 — End: 2023-10-02

## 2023-05-23 MED ORDER — METFORMIN HCL ER 500 MG PO TB24: 1000.0000 mg | ORAL_TABLET | Freq: Every day | ORAL | 1 refills | Status: DC

## 2023-05-31 ENCOUNTER — Other Ambulatory Visit: Payer: Self-pay | Admitting: Family Medicine

## 2023-05-31 MED ORDER — OXYCODONE HCL 5 MG PO TABS: 5.0000 mg | ORAL_TABLET | Freq: Two times a day (BID) | ORAL | 0 refills | Status: DC

## 2023-06-15 ENCOUNTER — Other Ambulatory Visit: Payer: Self-pay | Admitting: Family Medicine

## 2023-06-17 ENCOUNTER — Other Ambulatory Visit: Payer: Self-pay | Admitting: Family Medicine

## 2023-06-19 ENCOUNTER — Other Ambulatory Visit: Payer: Self-pay | Admitting: Family Medicine

## 2023-06-27 MED ORDER — OXYCODONE HCL 5 MG PO TABS: 5.0000 mg | ORAL_TABLET | Freq: Two times a day (BID) | ORAL | 0 refills | Status: DC

## 2023-07-11 ENCOUNTER — Other Ambulatory Visit: Payer: Self-pay | Admitting: Family Medicine

## 2023-07-11 ENCOUNTER — Ambulatory Visit: Payer: 59 | Admitting: Family Medicine

## 2023-07-14 ENCOUNTER — Other Ambulatory Visit: Payer: Self-pay | Admitting: Family Medicine

## 2023-07-14 DIAGNOSIS — K861 Other chronic pancreatitis: Secondary | ICD-10-CM

## 2023-07-15 ENCOUNTER — Other Ambulatory Visit: Payer: Self-pay | Admitting: Family Medicine

## 2023-07-16 ENCOUNTER — Encounter: Payer: Self-pay | Admitting: Family Medicine

## 2023-08-15 ENCOUNTER — Encounter: Payer: Self-pay | Admitting: Family Medicine

## 2023-08-15 ENCOUNTER — Ambulatory Visit (INDEPENDENT_AMBULATORY_CARE_PROVIDER_SITE_OTHER): Payer: 59 | Admitting: Family Medicine

## 2023-08-15 VITALS — BP 132/80 | HR 97 | Temp 98.2°F | Ht 64.0 in | Wt 160.0 lb

## 2023-08-15 DIAGNOSIS — Z7984 Long term (current) use of oral hypoglycemic drugs: Secondary | ICD-10-CM

## 2023-08-15 DIAGNOSIS — E1165 Type 2 diabetes mellitus with hyperglycemia: Secondary | ICD-10-CM

## 2023-08-15 MED ORDER — LANCET DEVICE MISC: 1.0000 | Freq: Three times a day (TID) | 0 refills | Status: AC

## 2023-08-15 MED ORDER — BLOOD GLUCOSE MONITORING SUPPL DEVI: 1.0000 | Freq: Three times a day (TID) | 0 refills | Status: DC

## 2023-08-15 MED ORDER — LANTUS SOLOSTAR 100 UNIT/ML ~~LOC~~ SOPN: 20.0000 [IU] | PEN_INJECTOR | Freq: Every day | SUBCUTANEOUS | 5 refills | Status: DC

## 2023-08-15 MED ORDER — LANCETS MISC. MISC: 1.0000 | Freq: Three times a day (TID) | 0 refills | Status: AC

## 2023-08-15 MED ORDER — BLOOD GLUCOSE TEST VI STRP: 1.0000 | ORAL_STRIP | Freq: Three times a day (TID) | 0 refills | Status: AC

## 2023-08-15 NOTE — Progress Notes (Signed)
 Subjective:    Patient ID: Jillian Shepherd, female    DOB: Dec 30, 1985, 38 y.o.   MRN: [id number] Patient has a history of alcohol induced pancreatitis.  She has stopped drinking.  However when I saw her in October, her blood sugar was over 500 and hemoglobin A1c was greater than 10 at that time started her Actos  20 mg a day metformin  1000 mg twice daily.  She presents today complaining of headache, polyuria, polydipsia, blurry vision, and fatigue.  She is not checking her sugars regularly but when she does, she reports that they are high and greater than 300 Wt Readings from Last 3 Encounters:  08/15/23 160 lb (72.6 kg)  05/17/23 160 lb 12.8 oz (72.9 kg)  04/08/23 173 lb 15.1 oz (78.9 kg)   Patient has lost 13 pounds since I last saw her.  She missed her last appointment and did not follow-up after starting her diabetes medication.  She does complain of cramps Past Medical History:  Diagnosis Date   Abdominal pain    Alcohol abuse    B12 deficiency    Cancer (HCC)    squmaous cell skin cancer    Collagen vascular disease (HCC)    Hypokalemia    Liddle's syndrome    Obesity (BMI 35.0-39.9 without comorbidity)    Pancreatitis    Pleural effusion    Splenic hemorrhage    Past Surgical History:  Procedure Laterality Date   ADENOIDECTOMY     CHEST TUBE INSERTION     COLONOSCOPY WITH PROPOFOL  N/A 10/06/2020   Procedure: COLONOSCOPY WITH PROPOFOL ;  Surgeon: Unk Corinn Skiff, MD;  Location: [facility];  Service: Gastroenterology;  Laterality: N/A;  COVID POSITIVE 08/23/2020   ESOPHAGOGASTRODUODENOSCOPY (EGD) WITH PROPOFOL  N/A 10/06/2020   Procedure: ESOPHAGOGASTRODUODENOSCOPY (EGD) WITH PROPOFOL ;  Surgeon: Unk Corinn Skiff, MD;  Location: [facility];  Service: Gastroenterology;  Laterality: N/A;   skin cancer removal Left    leg   TONSILLECTOMY     Current Outpatient Medications on File Prior to Visit  Medication Sig Dispense Refill   amitriptyline (ELAVIL) 50 MG tablet  Take 50 mg by mouth at bedtime as needed for sleep.     amLODipine  (NORVASC ) 10 MG tablet Take 1 tablet (10 mg total) by mouth daily. 90 tablet 3   buPROPion  (WELLBUTRIN  XL) 150 MG 24 hr tablet Take 1 tablet (150 mg total) by mouth daily. 30 tablet 5   cariprazine  (VRAYLAR ) 1.5 MG capsule Take 1 capsule (1.5 mg total) by mouth daily. 30 capsule 1   cyanocobalamin  (VITAMIN B12) 1000 MCG/ML injection INJECT 1 ML (1,000 MCG TOTAL) INTO THE MUSCLE EVERY 30 DAYS. 1 mL 2   hydrOXYzine  (VISTARIL ) 25 MG capsule Take 1 capsule (25 mg total) by mouth every 8 (eight) hours as needed (for sleep). 30 capsule 0   KLOR-CON  M20 20 MEQ tablet Take 20 mEq by mouth daily.     lipase/protease/amylase (CREON ) 36000 UNITS CPEP capsule Take 2 capsules (72,000 Units total) by mouth 3 (three) times daily with meals. May also take 1 capsule (36,000 Units total) as needed (with snacks). 240 capsule 11   metFORMIN  (GLUCOPHAGE -XR) 500 MG 24 hr tablet TAKE 2 TABLETS BY MOUTH WITH BREAKFAST DAILY FOR 7 DAYS THEN INCREASE TO 4 TABS WITH BREAKFAST 180 tablet 1   ondansetron  (ZOFRAN ) 4 MG tablet Take 1 tablet (4 mg total) by mouth every 8 (eight) hours as needed for nausea or vomiting. 20 tablet 0   oxyCODONE  (ROXICODONE ) 5 MG immediate release  tablet Take 1 tablet (5 mg total) by mouth 2 (two) times daily. 60 tablet 0   Oxycodone  HCl 10 MG TABS Take 1 tablet (10 mg total) by mouth every 6 (six) hours as needed (breakthrough pain only). 60 tablet 0   pioglitazone  (ACTOS ) 30 MG tablet Take 1 tablet (30 mg total) by mouth daily. 90 tablet 1   No current facility-administered medications on file prior to visit.   No Known Allergies Social History   Socioeconomic History   Marital status: Married    Spouse name: Not on file   Number of children: Not on file   Years of education: Not on file   Highest education level: Not on file  Occupational History   Not on file  Tobacco Use   Smoking status: Every Day    Current  packs/day: 0.25    Average packs/day: 0.3 packs/day for 9.0 years (2.3 ttl pk-yrs)    Types: Cigarettes    Passive exposure: Current   Smokeless tobacco: Never  Vaping Use   Vaping status: Never Used  Substance and Sexual Activity   Alcohol use: Yes    Alcohol/week: 2.0 standard drinks of alcohol    Types: 2 Cans of beer per week   Drug use: No   Sexual activity: Yes    Birth control/protection: None  Other Topics Concern   Not on file  Social History Narrative   ** Merged History Encounter **       Social Drivers of Health   Financial Resource Strain: Not on file  Food Insecurity: No Food Insecurity (04/05/2023)   Hunger Vital Sign    Worried About Running Out of Food in the Last Year: Never true    Ran Out of Food in the Last Year: Never true  Transportation Needs: No Transportation Needs (04/05/2023)   PRAPARE - Administrator, Civil Service (Medical): No    Lack of Transportation (Non-Medical): No  Physical Activity: Not on file  Stress: Not on file  Social Connections: Unknown (12/19/2021)   [facility] from [facility]   Social Network    Social Network: Not on file  Intimate Partner Violence: Not At Risk (04/05/2023)   Humiliation, Afraid, Rape, and Kick questionnaire    Fear of Current or Ex-Partner: No    Emotionally Abused: No    Physically Abused: No    Sexually Abused: No     Review of Systems  All other systems reviewed and are negative.      Objective:   Physical Exam Vitals reviewed.  Constitutional:      General: She is not in acute distress.    Appearance: Normal appearance. She is normal weight. She is not ill-appearing, toxic-appearing or diaphoretic.  HENT:     Nose: Nose normal. No congestion or rhinorrhea.     Mouth/Throat:     Pharynx: Oropharynx is clear. No oropharyngeal exudate or posterior oropharyngeal erythema.  Eyes:     General: No scleral icterus.    Conjunctiva/sclera: Conjunctivae normal.   Cardiovascular:     Rate and Rhythm: Normal rate and regular rhythm.     Pulses: Normal pulses.     Heart sounds: Normal heart sounds. No murmur heard.    No friction rub. No gallop.  Pulmonary:     Effort: Pulmonary effort is normal. No respiratory distress.     Breath sounds: Normal breath sounds. No stridor. No wheezing, rhonchi or rales.  Chest:     Chest wall:  No tenderness.  Abdominal:     General: Abdomen is flat. Bowel sounds are normal. There is no distension.     Palpations: Abdomen is soft. There is no mass.     Tenderness: There is abdominal tenderness. There is no right CVA tenderness, left CVA tenderness, guarding or rebound.     Hernia: No hernia is present.  Musculoskeletal:     Cervical back: Neck supple.  Neurological:     Mental Status: She is alert.           Assessment & Plan:  Type 2 diabetes mellitus with hyperglycemia, without long-term current use of insulin  (HCC) - Plan: Hemoglobin A1c, CBC with Differential/Platelet, COMPLETE METABOLIC PANEL WITH GFR, Lipid panel The cramps, polyuria, polydipsia, weight loss, and blurry vision, I feel are all symptoms of hypoglycemia.  Check CMP today to evaluate for any evidence of metabolic acidosis.  However the patient is mentating appropriately.  I will repeat an A1c however I anticipate that it will be greater than 14.  I want to start the patient on Lantus  10 units subcu daily.  The patient will increase her insulin  by 1 unit daily until fasting blood sugars are less than 130.  We discussed this in length and she is comfortable with this plan.  She will also check her blood sugars daily and give me an update on Monday.  If her sugars are extremely high, we will make a large titration in her Lantus .  I anticipate that she will likely need anywhere from 30 to 50 units of Lantus  daily.  The patient is 80 kg.  I anticipate 40 units/day as her likely projected requirement

## 2023-08-16 ENCOUNTER — Telehealth: Payer: Self-pay

## 2023-08-16 LAB — COMPLETE METABOLIC PANEL WITH GFR
AG Ratio: 1.3 (calc) (ref 1.0–2.5)
ALT: 14 U/L (ref 6–29)
AST: 20 U/L (ref 10–30)
Albumin: 4 g/dL (ref 3.6–5.1)
Alkaline phosphatase (APISO): 156 U/L — ABNORMAL HIGH (ref 31–125)
BUN/Creatinine Ratio: 10 (calc) (ref 6–22)
BUN: 6 mg/dL — ABNORMAL LOW (ref 7–25)
CO2: 26 mmol/L (ref 20–32)
Calcium: 9.1 mg/dL (ref 8.6–10.2)
Chloride: 94 mmol/L — ABNORMAL LOW (ref 98–110)
Creat: 0.6 mg/dL (ref 0.50–0.97)
Globulin: 3 g/dL (ref 1.9–3.7)
Glucose, Bld: 589 mg/dL (ref 65–99)
Potassium: 4.4 mmol/L (ref 3.5–5.3)
Sodium: 132 mmol/L — ABNORMAL LOW (ref 135–146)
Total Bilirubin: 0.3 mg/dL (ref 0.2–1.2)
Total Protein: 7 g/dL (ref 6.1–8.1)
eGFR: 118 mL/min/{1.73_m2} (ref >=60)

## 2023-08-16 LAB — CBC WITH DIFFERENTIAL/PLATELET
Absolute Lymphocytes: 3091 {cells}/uL (ref 850–3900)
Absolute Monocytes: 504 {cells}/uL (ref 200–950)
Basophils Absolute: 50 {cells}/uL (ref 0–200)
Basophils Relative: 0.6 %
Eosinophils Absolute: 67 {cells}/uL (ref 15–500)
Eosinophils Relative: 0.8 %
HCT: 44.2 % (ref 35.0–45.0)
Hemoglobin: 14.3 g/dL (ref 11.7–15.5)
MCH: 30.9 pg (ref 27.0–33.0)
MCHC: 32.4 g/dL (ref 32.0–36.0)
MCV: 95.5 fL (ref 80.0–100.0)
MPV: 10.1 fL (ref 7.5–12.5)
Monocytes Relative: 6 %
Neutro Abs: 4687 {cells}/uL (ref 1500–7800)
Neutrophils Relative %: 55.8 %
Platelets: 221 10*3/uL (ref 140–400)
RBC: 4.63 10*6/uL (ref 3.80–5.10)
RDW: 15.7 % — ABNORMAL HIGH (ref 11.0–15.0)
Total Lymphocyte: 36.8 %
WBC: 8.4 10*3/uL (ref 3.8–10.8)

## 2023-08-16 LAB — LIPID PANEL
Cholesterol: 211 mg/dL — ABNORMAL HIGH (ref <200)
HDL: 54 mg/dL (ref >=50)
LDL Cholesterol (Calc): 116 mg/dL — ABNORMAL HIGH
Non-HDL Cholesterol (Calc): 157 mg/dL — ABNORMAL HIGH (ref <130)
Total CHOL/HDL Ratio: 3.9 (calc) (ref <5.0)
Triglycerides: 311 mg/dL — ABNORMAL HIGH (ref <150)

## 2023-08-16 LAB — HEMOGLOBIN A1C: Hgb A1c MFr Bld: >14 %{Hb} — ABNORMAL HIGH (ref <5.7)

## 2023-08-16 NOTE — Telephone Encounter (Signed)
 Copied from CRM [id number]. Topic: General - Other >> Aug 16, 2023  9:02 AM Susanna ORN wrote: Reason for CRM: Angeline, with [facility], called to check if [facility] received the critical labs. Called [facility] CAL & spoke with Nanette to check. She stated to have her send the results again via fax since they may have already been sent to the back. While speaking with Nanette, she disconnected. Called back & spoke with someone else & they provided the critical lab results for this pt. States pt's glucose is high at 589, verified by repeat analysis. Pt also has other abnormals but rep sent those via fax. Verified fax number with [facility] rep.

## 2023-08-16 NOTE — Telephone Encounter (Signed)
-----   Message from [name] sent at 08/16/2023 12:04 PM EST ----- Have patient increase lantus to 20 units

## 2023-08-27 ENCOUNTER — Encounter: Payer: 59 | Admitting: Family Medicine

## 2023-08-29 ENCOUNTER — Other Ambulatory Visit: Payer: Self-pay

## 2023-08-29 DIAGNOSIS — K861 Other chronic pancreatitis: Secondary | ICD-10-CM

## 2023-08-29 DIAGNOSIS — E1165 Type 2 diabetes mellitus with hyperglycemia: Secondary | ICD-10-CM

## 2023-08-29 MED ORDER — SURE-FINE PEN NEEDLES 31G X 8 MM MISC: 1 refills | Status: DC

## 2023-09-23 ENCOUNTER — Other Ambulatory Visit: Payer: Self-pay | Admitting: Family Medicine

## 2023-09-23 DIAGNOSIS — K861 Other chronic pancreatitis: Secondary | ICD-10-CM

## 2023-09-23 NOTE — Telephone Encounter (Signed)
Prescription Request  09/23/2023  LOV: 08/15/2023  What is the name of the medication or equipment?   metFORMIN (GLUCOPHAGE-XR) 500 MG 24 hr tablet  **90 day script requested**  Have you contacted your pharmacy to request a refill? Yes   Which pharmacy would you like this sent to?  [id number] - [place], [place] - [address] AT [facility] [address] [place] [address] Phone: [phone] Fax: [phone]    Patient notified that their request is being sent to the clinical staff for review and that they should receive a response within 2 business days.   Please advise pharmacist.

## 2023-09-24 NOTE — Telephone Encounter (Signed)
Requested medications are due for refill today.  yes  Requested medications are on the active medications list.  yes  Last refill. 07/15/2023 #180 1 rf  Future visit scheduled.   no  Notes to clinic.  Please update sig.    Requested Prescriptions  Pending Prescriptions Disp Refills   metFORMIN (GLUCOPHAGE-XR) 500 MG 24 hr tablet 180 tablet 1     Endocrinology:  Diabetes - Biguanides Failed - 09/24/2023 10:44 AM      Failed - HBA1C is between 0 and 7.9 and within 180 days    Hgb A1c MFr Bld  Date Value Ref Range Status  08/15/2023 >14.0 (H) <5.7 % of total Hgb Final    Comment:    Verified by repeat analysis. . For someone without known diabetes, a hemoglobin A1c value of 6.5% or greater indicates that they may have  diabetes and this should be confirmed with a follow-up  test. . For someone with known diabetes, a value <7% indicates  that their diabetes is well controlled and a value  greater than or equal to 7% indicates suboptimal  control. A1c targets should be individualized based on  duration of diabetes, age, comorbid conditions, and  other considerations. . Currently, no consensus exists regarding use of hemoglobin A1c for diagnosis of diabetes for children. .          Failed - Valid encounter within last 6 months    Recent Outpatient Visits           2 years ago Hypokalemia   [facility] [place], Priscille Heidelberg, MD   2 years ago Other secondary hypertension   [facility] [place], Priscille Heidelberg, MD   3 years ago Epigastric pain   [facility] Tanya Nones, Priscille Heidelberg, MD   3 years ago Epigastric pain   [facility] Tanya Nones, Priscille Heidelberg, MD   4 years ago Right forearm pain   [facility] [place], Velna Hatchet, MD              Passed - Cr in normal range and within 360 days    Creat  Date Value Ref Range Status  08/15/2023 0.60 0.50 - 0.97 mg/dL Final   Creatinine, Urine  Date Value Ref  Range Status  05/17/2023 17 (L) 20 - 275 mg/dL Final         Passed - eGFR in normal range and within 360 days    GFR, Est African American  Date Value Ref Range Status  10/11/2020 129 > OR = 60 mL/min/1.77m2 Final   GFR, Est Non African American  Date Value Ref Range Status  10/11/2020 111 > OR = 60 mL/min/1.68m2 Final   GFR, Estimated  Date Value Ref Range Status  04/07/2023 >60 >60 mL/min Final    Comment:    (NOTE) Calculated using the CKD-EPI Creatinine Equation (2021)    eGFR  Date Value Ref Range Status  08/15/2023 118 > OR = 60 mL/min/1.46m2 Final         Passed - B12 Level in normal range and within 720 days    Vitamin B-12  Date Value Ref Range Status  08/15/2022 186 180 - 914 pg/mL Final    Comment:    (NOTE) This assay is not validated for testing neonatal or myeloproliferative syndrome specimens for Vitamin B12 levels. Performed at [facility], [address] [place], [place] [address]          Passed -  CBC within normal limits and completed in the last 12 months    WBC  Date Value Ref Range Status  08/15/2023 8.4 3.8 - 10.8 Thousand/uL Final   RBC  Date Value Ref Range Status  08/15/2023 4.63 3.80 - 5.10 Million/uL Final   Hemoglobin  Date Value Ref Range Status  08/15/2023 14.3 11.7 - 15.5 g/dL Final   HCT  Date Value Ref Range Status  08/15/2023 44.2 35.0 - 45.0 % Final   MCHC  Date Value Ref Range Status  08/15/2023 32.4 32.0 - 36.0 g/dL Final    Comment:    For adults, a slight decrease in the calculated MCHC value (in the range of 30 to 32 g/dL) is most likely not clinically significant; however, it should be interpreted with caution in correlation with other red cell parameters and the patient's clinical condition.    [facility]  Date Value Ref Range Status  08/15/2023 30.9 27.0 - 33.0 pg Final   MCV  Date Value Ref Range Status  08/15/2023 95.5 80.0 - 100.0 fL Final   No results found for: "PLTCOUNTKUC", "LABPLAT",  "POCPLA" RDW  Date Value Ref Range Status  08/15/2023 15.7 (H) 11.0 - 15.0 % Final

## 2023-09-26 ENCOUNTER — Other Ambulatory Visit: Payer: Self-pay

## 2023-09-26 DIAGNOSIS — K861 Other chronic pancreatitis: Secondary | ICD-10-CM

## 2023-09-26 MED ORDER — METFORMIN HCL ER 500 MG PO TB24: 2000.0000 mg | ORAL_TABLET | Freq: Every day | ORAL | 1 refills | Status: DC

## 2023-10-02 ENCOUNTER — Ambulatory Visit
Admission: EM | Admit: 2023-10-02 | Discharge: 2023-10-02 | Disposition: A | Discharge status: Home / Self Care | Payer: Self-pay | Attending: Family Medicine | Admitting: Family Medicine

## 2023-10-02 ENCOUNTER — Encounter: Payer: Self-pay | Admitting: Emergency Medicine

## 2023-10-02 DIAGNOSIS — J039 Acute tonsillitis, unspecified: Secondary | ICD-10-CM

## 2023-10-02 HISTORY — DX: Type 2 diabetes mellitus without complications: E11.9

## 2023-10-02 LAB — POCT RAPID STREP A (OFFICE): Rapid Strep A Screen: NEGATIVE

## 2023-10-02 MED ORDER — AMOXICILLIN 875 MG PO TABS: 875.0000 mg | ORAL_TABLET | Freq: Two times a day (BID) | ORAL | 0 refills | Status: DC

## 2023-10-02 MED ORDER — LIDOCAINE VISCOUS HCL 2 % MT SOLN: 10.0000 mL | OROMUCOSAL | 0 refills | Status: DC | PRN

## 2023-10-02 NOTE — ED Triage Notes (Signed)
 Sore throat x 2 days.  States neck is swollen

## 2023-10-02 NOTE — ED Provider Notes (Signed)
 [facility]    CSN: [id number] Arrival date & time: 10/02/23  1827      History   Chief Complaint No chief complaint on file.   HPI Jillian Shepherd is a 38 y.o. female.   Patient resenting today with 2-day history of progressively worsening sore, swollen feeling throat, neck swelling, difficulty swallowing.  Denies fever, chills, body aches, chest pain, shortness of breath, cough, congestion.  So far trying to order gargles, over-the-counter pain relievers and minimal relief.  No known sick contacts recently.    Past Medical History:  Diagnosis Date   Abdominal pain    Alcohol abuse    B12 deficiency    Cancer (HCC)    squmaous cell skin cancer    Collagen vascular disease (HCC)    Diabetes mellitus without complication (HCC)    Hypokalemia    Liddle's syndrome    Obesity (BMI 35.0-39.9 without comorbidity)    Pancreatitis    Pleural effusion    Splenic hemorrhage     Patient Active Problem List   Diagnosis Date Noted   GERD (gastroesophageal reflux disease) 04/05/2023   Elevated lipase 04/05/2023   Chronic alcohol use 04/05/2023   Pseudocyst of pancreas due to acute pancreatitis 04/05/2023   PICC line infection, initial encounter 11/07/2022   Abdominal pain, epigastric 08/19/2022   Acute recurrent pancreatitis 08/15/2022   Pancreatitis, necrotizing 08/15/2022   Cavernous transformation of portal vein 07/24/2022   Leukocytosis 07/21/2022   Chronic pancreatitis (HCC) 07/18/2022   Dehydration 07/07/2022   Diabetes mellitus type II, controlled (HCC) 07/01/2022   Acute on chronic pancreatitis 06/30/2022   Malnutrition of moderate degree 06/12/2022   Pancreatic pseudocyst 06/11/2022   Acute hypoxic respiratory failure (HCC) 06/10/2022   Pleural effusion on left 06/10/2022   Spleen hematoma 06/08/2022   Acute pancreatitis 05/31/2022   History of splenic vein thrombosis 05/31/2022   Liddle's syndrome 05/31/2022   Hypoxia 05/11/2022   Abdominal pain  05/10/2022   Nausea & vomiting 05/10/2022   Hypokalemia 05/10/2022   Hypomagnesemia 05/10/2022   Prolonged QT interval 05/10/2022   Hyperglycemia 05/10/2022   Alcohol abuse 05/10/2022   Acute alcoholic pancreatitis 05/10/2022   Diarrhea    Pancreatitis, acute 07/27/2020   Sacral back pain 09/14/2019   Bilateral ovarian cysts 09/02/2018   Elevated blood pressure reading without diagnosis of hypertension 01/20/2015   Essential hypertension 07/07/2014   Depression 07/07/2014   Obesity 07/07/2014    Past Surgical History:  Procedure Laterality Date   ADENOIDECTOMY     CHEST TUBE INSERTION     COLONOSCOPY WITH PROPOFOL N/A 10/06/2020   Procedure: COLONOSCOPY WITH PROPOFOL;  Surgeon: Toney Reil, MD;  Location: [facility];  Service: Gastroenterology;  Laterality: N/A;  COVID POSITIVE 08/23/2020   ESOPHAGOGASTRODUODENOSCOPY (EGD) WITH PROPOFOL N/A 10/06/2020   Procedure: ESOPHAGOGASTRODUODENOSCOPY (EGD) WITH PROPOFOL;  Surgeon: Toney Reil, MD;  Location[facility] END[facility]COPY;  Service: Gastroenterology;  Laterality: N/A;   skin cancer removal Left    leg   TONSILLECTOMY      OB History     Gravida  6   Para  5   Term  5   Preterm  0   AB  1   Living  3      SAB  0   IAB  0   Ectopic  0   Multiple      Live Births  3            Home Medications  Prior to Admission medications   Medication Sig Start Date End Date Taking? Authorizing Provider  amoxicillin (AMOXIL) 875 MG tablet Take 1 tablet (875 mg total) by mouth 2 (two) times daily. 10/02/23  Yes Particia Nearing, PA-C  lidocaine (XYLOCAINE) 2 % solution Use as directed 10 mLs in the mouth or throat every 3 (three) hours as needed. 10/02/23  Yes Particia Nearing, PA-C  amitriptyline (ELAVIL) 50 MG tablet Take 50 mg by mouth at bedtime as needed for sleep. 04/01/23   [provider]  Blood Glucose Monitoring Suppl DEVI 1 each by Does not apply route in the morning, at  noon, and at bedtime. May substitute to any manufacturer covered by patient's insurance. 08/15/23   [name], MD  insulin glargine (LANTUS SOLOSTAR) 100 UNIT/ML Solostar Pen Inject 20 Units into the skin daily. 08/15/23   [name], MD  Insulin Pen Needle (SURE-FINE PEN NEEDLES) 31G X 8 MM MISC Use to administer insulin daily. 08/29/23   [name], MD  metFORMIN (GLUCOPHAGE-XR) 500 MG 24 hr tablet Take 4 tablets (2,000 mg total) by mouth daily with breakfast. 09/26/23   [name], MD    Family History Family History  Problem Relation Age of Onset   Hypertension Father    Anxiety disorder Father    Depression Father    Melanoma Paternal Grandmother        of skin   Diabetes Paternal Grandmother    Cirrhosis Paternal Uncle     Social History Social History   Tobacco Use   Smoking status: Every Day    Current packs/day: 0.25    Average packs/day: 0.3 packs/day for 9.0 years (2.3 ttl pk-yrs)    Types: Cigarettes    Passive exposure: Current   Smokeless tobacco: Never  Vaping Use   Vaping status: Never Used  Substance Use Topics   Alcohol use: Yes    Alcohol/week: 2.0 standard drinks of alcohol    Types: 2 Cans of beer per week   Drug use: No     Allergies   Patient has no known allergies.   Review of Systems Review of Systems Per HPI  Physical Exam Triage Vital Signs ED Triage Vitals [10/02/23 1908]  Encounter Vitals Group     BP 119/83     Systolic BP Percentile      Diastolic BP Percentile      Pulse Rate (!) 113     Resp 18     Temp 97.7 F (36.5 C)     Temp Source Oral     SpO2 95 %     Weight      Height      Head Circumference      Peak Flow      Pain Score 6     Pain Loc      Pain Education      Exclude from Growth Chart    No data found.  Updated Vital Signs BP 119/83 (BP Location: Right Arm)   Pulse (!) 113   Temp 97.7 F (36.5 C) (Oral)   Resp 18   LMP 09/30/2023 (Exact Date)   SpO2 95%   Visual  Acuity Right Eye Distance:   Left Eye Distance:   Bilateral Distance:    Right Eye Near:   Left Eye Near:    Bilateral Near:     Physical Exam Vitals and nursing note reviewed.  Constitutional:      Appearance: Normal appearance.  HENT:     Head: Atraumatic.     Right Ear: Tympanic membrane and external ear normal.     Left Ear: Tympanic membrane and external ear normal.     Nose: Nose normal.     Mouth/Throat:     Mouth: Mucous membranes are moist.     Pharynx: Oropharyngeal exudate and posterior oropharyngeal erythema present.  Eyes:     Extraocular Movements: Extraocular movements intact.     Conjunctiva/sclera: Conjunctivae normal.  Cardiovascular:     Rate and Rhythm: Normal rate and regular rhythm.     Heart sounds: Normal heart sounds.  Pulmonary:     Effort: Pulmonary effort is normal.     Breath sounds: Normal breath sounds. No wheezing or rales.  Musculoskeletal:        General: Normal range of motion.     Cervical back: Normal range of motion and neck supple.  Lymphadenopathy:     Cervical: Cervical adenopathy present.  Skin:    General: Skin is warm and dry.  Neurological:     Mental Status: She is alert and oriented to person, place, and time.  Psychiatric:        Mood and Affect: Mood normal.        Thought Content: Thought content normal.      UC Treatments / Results  Labs (all labs ordered are listed, but only abnormal results are displayed) Labs Reviewed  POCT RAPID STREP A (OFFICE)    EKG   Radiology No results found.  Procedures Procedures (including critical care time)  Medications Ordered in UC Medications - No data to display  Initial Impression / Assessment and Plan / UC Course  I have reviewed the triage vital signs and the nursing notes.  Pertinent labs & imaging results that were available during my care of the patient were reviewed by me and considered in my medical decision making (see chart for details).      Tachycardic in triage, otherwise vital signs reassuring.  Rapid strep negative but exam very concerning for a bacterial tonsillitis.  Will treat with Amoxil, viscous lidocaine, supportive over-the-counter medications and home care.  Return for worsening symptoms.  Final Clinical Impressions(s) / UC Diagnoses   Final diagnoses:  Acute tonsillitis, unspecified etiology   Discharge Instructions   None    ED Prescriptions     Medication Sig Dispense Auth. Provider   amoxicillin (AMOXIL) 875 MG tablet Take 1 tablet (875 mg total) by mouth 2 (two) times daily. 20 tablet Particia Nearing, PA-C   lidocaine (XYLOCAINE) 2 % solution Use as directed 10 mLs in the mouth or throat every 3 (three) hours as needed. 100 mL Particia Nearing, [place]      PDMP not reviewed this encounter.   Particia Nearing, [place] 10/02/23 1949

## 2023-12-09 ENCOUNTER — Emergency Department (HOSPITAL_COMMUNITY)

## 2023-12-09 ENCOUNTER — Other Ambulatory Visit: Payer: Self-pay | Admitting: Family Medicine

## 2023-12-09 ENCOUNTER — Other Ambulatory Visit: Payer: Self-pay

## 2023-12-09 ENCOUNTER — Emergency Department (HOSPITAL_COMMUNITY)
Admission: EM | Admit: 2023-12-09 | Discharge: 2023-12-09 | Disposition: A | Discharge status: Home / Self Care | Attending: Emergency Medicine | Admitting: Emergency Medicine

## 2023-12-09 ENCOUNTER — Encounter (HOSPITAL_COMMUNITY): Payer: Self-pay

## 2023-12-09 DIAGNOSIS — I10 Essential (primary) hypertension: Secondary | ICD-10-CM | POA: Insufficient documentation

## 2023-12-09 DIAGNOSIS — Z794 Long term (current) use of insulin: Secondary | ICD-10-CM | POA: Diagnosis not present

## 2023-12-09 DIAGNOSIS — Z85828 Personal history of other malignant neoplasm of skin: Secondary | ICD-10-CM | POA: Insufficient documentation

## 2023-12-09 DIAGNOSIS — Z7984 Long term (current) use of oral hypoglycemic drugs: Secondary | ICD-10-CM | POA: Insufficient documentation

## 2023-12-09 DIAGNOSIS — E860 Dehydration: Secondary | ICD-10-CM | POA: Insufficient documentation

## 2023-12-09 DIAGNOSIS — K852 Alcohol induced acute pancreatitis without necrosis or infection: Secondary | ICD-10-CM | POA: Insufficient documentation

## 2023-12-09 DIAGNOSIS — E1165 Type 2 diabetes mellitus with hyperglycemia: Secondary | ICD-10-CM | POA: Diagnosis not present

## 2023-12-09 DIAGNOSIS — R109 Unspecified abdominal pain: Secondary | ICD-10-CM | POA: Diagnosis present

## 2023-12-09 DIAGNOSIS — Z87891 Personal history of nicotine dependence: Secondary | ICD-10-CM | POA: Insufficient documentation

## 2023-12-09 LAB — URINALYSIS, ROUTINE W REFLEX MICROSCOPIC
Bacteria, UA: NONE SEEN
Bilirubin Urine: NEGATIVE
Glucose, UA: >=500 mg/dL — AB
Hgb urine dipstick: NEGATIVE
Ketones, ur: 80 mg/dL — AB
Nitrite: POSITIVE — AB
Protein, ur: 30 mg/dL — AB
Specific Gravity, Urine: 1.035 — ABNORMAL HIGH (ref 1.005–1.030)
pH: 6 (ref 5.0–8.0)

## 2023-12-09 LAB — CBC
HCT: 47.6 % — ABNORMAL HIGH (ref 36.0–46.0)
Hemoglobin: 16.5 g/dL — ABNORMAL HIGH (ref 12.0–15.0)
MCH: 31.7 pg (ref 26.0–34.0)
MCHC: 34.7 g/dL (ref 30.0–36.0)
MCV: 91.5 fL (ref 80.0–100.0)
Platelets: 228 10*3/uL (ref 150–400)
RBC: 5.2 MIL/uL — ABNORMAL HIGH (ref 3.87–5.11)
RDW: 13.2 % (ref 11.5–15.5)
WBC: 8.2 10*3/uL (ref 4.0–10.5)
nRBC: 0 % (ref 0.0–0.2)

## 2023-12-09 LAB — BASIC METABOLIC PANEL WITH GFR
Anion gap: 12 (ref 5–15)
BUN: 9 mg/dL (ref 6–20)
CO2: 24 mmol/L (ref 22–32)
Calcium: 8.8 mg/dL — ABNORMAL LOW (ref 8.9–10.3)
Chloride: 96 mmol/L — ABNORMAL LOW (ref 98–111)
Creatinine, Ser: 0.54 mg/dL (ref 0.44–1.00)
GFR, Estimated: >60 mL/min (ref >60)
Glucose, Bld: 315 mg/dL — ABNORMAL HIGH (ref 70–99)
Potassium: 3.8 mmol/L (ref 3.5–5.1)
Sodium: 132 mmol/L — ABNORMAL LOW (ref 135–145)

## 2023-12-09 LAB — TROPONIN I (HIGH SENSITIVITY)
Troponin I (High Sensitivity): 2 ng/L (ref <18)
Troponin I (High Sensitivity): 2 ng/L (ref <18)

## 2023-12-09 LAB — LIPASE, BLOOD: Lipase: 383 U/L — ABNORMAL HIGH (ref 11–51)

## 2023-12-09 LAB — POC URINE PREG, ED: Preg Test, Ur: NEGATIVE

## 2023-12-09 MED ORDER — HYDROMORPHONE HCL 1 MG/ML IJ SOLN
1.0000 mg | Freq: Once | INTRAMUSCULAR | Status: AC
  Administered 2023-12-09: 1 mg via INTRAVENOUS
  Filled 2023-12-09: qty 1

## 2023-12-09 MED ORDER — LACTATED RINGERS IV BOLUS
1000.0000 mL | Freq: Once | INTRAVENOUS | Status: AC
  Administered 2023-12-09: 1000 mL via INTRAVENOUS

## 2023-12-09 MED ORDER — ONDANSETRON 4 MG PO TBDP
4.0000 mg | ORAL_TABLET | Freq: Three times a day (TID) | ORAL | 0 refills | Status: DC | PRN
Start: 2023-12-09 — End: 2024-02-04

## 2023-12-09 MED ORDER — OXYCODONE-ACETAMINOPHEN 5-325 MG PO TABS: 1.0000 | ORAL_TABLET | Freq: Four times a day (QID) | ORAL | 0 refills | Status: DC | PRN

## 2023-12-09 MED ORDER — IOHEXOL 300 MG/ML  SOLN
100.0000 mL | Freq: Once | INTRAMUSCULAR | Status: AC | PRN
  Administered 2023-12-09: 100 mL via INTRAVENOUS

## 2023-12-09 MED ORDER — ONDANSETRON HCL 4 MG/2ML IJ SOLN
4.0000 mg | Freq: Once | INTRAMUSCULAR | Status: AC
  Administered 2023-12-09: 4 mg via INTRAVENOUS
  Filled 2023-12-09: qty 2

## 2023-12-09 MED ORDER — SODIUM CHLORIDE 0.9 % IV BOLUS
1000.0000 mL | Freq: Once | INTRAVENOUS | Status: AC
  Administered 2023-12-09: 1000 mL via INTRAVENOUS

## 2023-12-09 NOTE — Discharge Instructions (Addendum)
 Do not drink alcohol.  Make sure you are taking all medications as directed.

## 2023-12-09 NOTE — ED Triage Notes (Addendum)
 Pt c/o centralized chest pain that radiates to her back, states she started having pain on Friday but has gotten worse. Endorses nausea. Pt states she feels like her abdomin is distended.  Hx of pancreatitis

## 2023-12-09 NOTE — ED Provider Notes (Signed)
 [facility] DEPARTMENT AT [facility] Provider Note   CSN: [id number] Arrival date & time: 12/09/23  0550     History  Chief Complaint  Patient presents with   Chest Pain    Jillian Shepherd is a 38 y.o. female.  Pt is a 38 yo female with pmhx significant for obesity, alcohol abuse, skin cancer, Liddle syndrome, DM2, chronic pancreatitis complicated by occlusive thrombosis of the portal vein/splenic vein (chronic), and tobacco abuse.  Pt admits to drinking this weekend and then developing cp and abd pain.  Pt feels like it is her pancreatitis.  Pt has been having nausea and has been unable to keep down her bp meds.         Home Medications Prior to Admission medications   Medication Sig Start Date End Date Taking? Authorizing Provider  ondansetron  (ZOFRAN -ODT) 4 MG disintegrating tablet Take 1 tablet (4 mg total) by mouth every 8 (eight) hours as needed. 12/09/23  Yes Sueellen Emery, MD  oxyCODONE -acetaminophen  (PERCOCET/ROXICET) 5-325 MG tablet Take 1 tablet by mouth every 6 (six) hours as needed for severe pain (pain score 7-10). 12/09/23  Yes Becky Berberian, MD  amitriptyline (ELAVIL) 50 MG tablet Take 50 mg by mouth at bedtime as needed for sleep. 04/01/23   [provider]  amoxicillin  (AMOXIL ) 875 MG tablet Take 1 tablet (875 mg total) by mouth 2 (two) times daily. 10/02/23   Corbin Dess, PA-C  Blood Glucose Monitoring Suppl DEVI 1 each by Does not apply route in the morning, at noon, and at bedtime. May substitute to any manufacturer covered by patient's insurance. 08/15/23   Austine Lefort, MD  insulin  glargine (LANTUS  SOLOSTAR) 100 UNIT/ML Solostar Pen Inject 20 Units into the skin daily. 08/15/23   Austine Lefort, MD  Insulin  Pen Needle (SURE-FINE PEN NEEDLES) 31G X 8 MM MISC Use to administer insulin  daily. 08/29/23   Austine Lefort, MD  lidocaine  (XYLOCAINE ) 2 % solution Use as directed 10 mLs in the mouth or throat every 3 (three)  hours as needed. 10/02/23   Corbin Dess, PA-C  metFORMIN  (GLUCOPHAGE -XR) 500 MG 24 hr tablet Take 4 tablets (2,000 mg total) by mouth daily with breakfast. 09/26/23   Austine Lefort, MD      Allergies    Patient has no known allergies.    Review of Systems   Review of Systems  Cardiovascular:  Positive for chest pain.  Gastrointestinal:  Positive for abdominal pain, nausea and vomiting.  All other systems reviewed and are negative.   Physical Exam Updated Vital Signs BP (!) 150/105   Pulse 92   Temp 98.1 F (36.7 C) (Oral)   Resp 18   Ht 5\' 4"  (1.626 m)   Wt 77.1 kg   LMP 11/27/2023 (Approximate)   SpO2 97%   BMI 29.18 kg/m  Physical Exam Vitals and nursing note reviewed.  Constitutional:      Appearance: She is well-developed. She is ill-appearing.  HENT:     Head: Normocephalic and atraumatic.  Eyes:     Extraocular Movements: Extraocular movements intact.     Pupils: Pupils are equal, round, and reactive to light.  Cardiovascular:     Rate and Rhythm: Regular rhythm. Tachycardia present.     Heart sounds: Normal heart sounds.  Pulmonary:     Effort: Pulmonary effort is normal.     Breath sounds: Normal breath sounds.  Abdominal:     General: Bowel sounds are normal.  Palpations: Abdomen is soft.     Tenderness: There is abdominal tenderness in the epigastric area.  Musculoskeletal:        General: Normal range of motion.     Cervical back: Normal range of motion and neck supple.  Skin:    General: Skin is warm.     Capillary Refill: Capillary refill takes less than 2 seconds.  Neurological:     General: No focal deficit present.     Mental Status: She is alert and oriented to person, place, and time.  Psychiatric:        Mood and Affect: Mood normal.        Behavior: Behavior normal.     ED Results / Procedures / Treatments   Labs (all labs ordered are listed, but only abnormal results are displayed) Labs Reviewed  BASIC METABOLIC  PANEL WITH GFR - Abnormal; Notable for the following components:      Result Value   Sodium 132 (*)    Chloride 96 (*)    Glucose, Bld 315 (*)    Calcium 8.8 (*)    All other components within normal limits  CBC - Abnormal; Notable for the following components:   RBC 5.20 (*)    Hemoglobin 16.5 (*)    HCT 47.6 (*)    All other components within normal limits  LIPASE, BLOOD - Abnormal; Notable for the following components:   Lipase 383 (*)    All other components within normal limits  URINALYSIS, ROUTINE W REFLEX MICROSCOPIC - Abnormal; Notable for the following components:   Specific Gravity, Urine 1.035 (*)    Glucose, UA >=500 (*)    Ketones, ur 80 (*)    Protein, ur 30 (*)    Nitrite POSITIVE (*)    Leukocytes,Ua TRACE (*)    All other components within normal limits  POC URINE PREG, ED  TROPONIN I (HIGH SENSITIVITY)  TROPONIN I (HIGH SENSITIVITY)    EKG EKG Interpretation Date/Time:  Monday Dec 09 2023 06:17:18 EDT Ventricular Rate:  105 PR Interval:  138 QRS Duration:  110 QT Interval:  378 QTC Calculation: 500 R Axis:   93  Text Interpretation: Sinus tachycardia Inferolateral infarct, age indeterminate Since last tracing rate faster Confirmed by Sueellen Emery [id number]) on 12/09/2023 7:02:27 AM  Radiology CT ABDOMEN PELVIS W CONTRAST Result Date: 12/09/2023 CLINICAL DATA:  Abdominal pain, acute, nonlocalized EXAM: CT ABDOMEN AND PELVIS WITH CONTRAST TECHNIQUE: Multidetector CT imaging of the abdomen and pelvis was performed using the standard protocol following bolus administration of intravenous contrast. RADIATION DOSE REDUCTION: This exam was performed according to the departmental dose-optimization program which includes automated exposure control, adjustment of the mA and/or kV according to patient size and/or use of iterative reconstruction technique. CONTRAST:  OMNIPAQUE  IOHEXOL  300 MG/ML  SOLN COMPARISON:  April 05, 2023 FINDINGS: Lower chest: No acute  abnormality. Hepatobiliary: No focal liver abnormality is seen. No gallstones, gallbladder wall thickening, or biliary dilatation. Mild diffuse inhomogeneity of the liver could correlate with fatty liver changes Pancreas: Comparison with prior examinations demonstrates interval improvement in the peripancreatic inflammatory changes and fluid. Comparison with prior examination accounting for differences in techniques two small residual collections are noted within the region of the body tail of the pancreas the appears slightly smaller than prior examination, the more centrally located 12.4 x 1.5 cm, the more distally located one is 1 0.9 x 2 cm. Comparison with prior examinations demonstrates minimal decrease in size of the previously described  perisplenic collection measuring today 10.5 x 6.7 cm compared to 11.2 x 7.5 on the prior. These findings are consistent with residual pancreatic pseudocyst formation. No other new peripancreatic fluid collections or definitive evidence of infected collections. Spleen: As described above, slightly decreased perisplenic pseudocysts. No other splenic lesions. Adrenals/Urinary Tract: Adrenal glands are unremarkable. Kidneys are normal, without renal calculi, focal lesion, or hydronephrosis. Bladder is unremarkable. Stomach/Bowel: Stomach is within normal limits. Appendix appears normal. No evidence of bowel wall thickening, distention, or inflammatory changes. Vascular/Lymphatic: No significant vascular findings are present. No enlarged abdominal or pelvic lymph nodes. Reproductive: Uterus and bilateral adnexa are unremarkable. Other: No abdominal wall hernia or abnormality. No abdominopelvic ascites. Musculoskeletal: No acute or significant osseous findings. IMPRESSION: *Interval improvement in the peripancreatic inflammatory changes and fluid. *Slight interval decrease in size of the previously described perisplenic pseudocysts. *No other new peripancreatic fluid collections or  definitive evidence of infected collections. *Mild diffuse inhomogeneity of the liver could correlate with fatty liver changes. Electronically Signed   By: Fredrich Jefferson M.D.   On: 12/09/2023 10:13   DG Chest 2 View Result Date: 12/09/2023 CLINICAL DATA:  Chest pain radiating to the back with nausea. EXAM: CHEST - 2 VIEW COMPARISON:  Portable chest 07/07/2022 FINDINGS: The heart size and mediastinal contours are within normal limits. Both lungs are clear. The visualized skeletal structures are unremarkable. Compare: Interval resolution of prior airspace disease and effusion on the left. IMPRESSION: No active cardiopulmonary disease. Electronically Signed   By: Denman Fischer M.D.   On: 12/09/2023 06:39    Procedures Procedures    Medications Ordered in ED Medications  ondansetron  (ZOFRAN ) injection 4 mg (4 mg Intravenous Given 12/09/23 0651)  lactated ringers  bolus 1,000 mL (0 mLs Intravenous Stopped 12/09/23 0803)  sodium chloride  0.9 % bolus 1,000 mL (0 mLs Intravenous Stopped 12/09/23 1018)  HYDROmorphone  (DILAUDID ) injection 1 mg (1 mg Intravenous Given 12/09/23 0803)  HYDROmorphone  (DILAUDID ) injection 1 mg (1 mg Intravenous Given 12/09/23 0857)  iohexol  (OMNIPAQUE ) 300 MG/ML solution 100 mL (100 mLs Intravenous Contrast Given 12/09/23 0939)  HYDROmorphone  (DILAUDID ) injection 1 mg (1 mg Intravenous Given 12/09/23 1014)    ED Course/ Medical Decision Making/ A&P                                 Medical Decision Making Amount and/or Complexity of Data Reviewed Labs: ordered. Radiology: ordered.  Risk Prescription drug management.   This patient presents to the ED for concern of chest and abd pain, this involves an extensive number of treatment options, and is a complaint that carries with it a high risk of complications and morbidity.  The differential diagnosis includes cardiac, gi, pulm   Co morbidities that complicate the patient evaluation  obesity, alcohol abuse, skin cancer, Liddle  syndrome, DM2, chronic pancreatitis complicated by occlusive thrombosis of the portal vein/splenic vein (chronic), and tobacco abuse   Additional history obtained:  Additional history obtained from epic chart review External records from outside source obtained and reviewed including husband   Lab Tests:  I Ordered, and personally interpreted labs.  The pertinent results include:  cbc with hgb 16.5; bmp nl other than glucose elevated at 315; trop 2 ua + ketones; lip elevated at 383, preg neg   Imaging Studies ordered:  I ordered imaging studies including cxr and ct I independently visualized and interpreted imaging which showed  CXR:  No active cardiopulmonary disease.  CT abd/pelvis: *Interval improvement in the peripancreatic inflammatory changes and  fluid.  *Slight interval decrease in size of the previously described  perisplenic pseudocysts.  *No other new peripancreatic fluid collections or definitive  evidence of infected collections.  *Mild diffuse inhomogeneity of the liver could correlate with fatty  liver changes.   I agree with the radiologist interpretation   Cardiac Monitoring:  The patient was maintained on a cardiac monitor.  I personally viewed and interpreted the cardiac monitored which showed an underlying rhythm of: st   Medicines ordered and prescription drug management:  I ordered medication including ivfs/dilaudid /zofran   for sx  Reevaluation of the patient after these medicines showed that the patient improved I have reviewed the patients home medicines and have made adjustments as needed   Test Considered:  ct   Critical Interventions:  Pain control  Problem List / ED Course:  Acute on chronic pancreatitis:  pain has improved with treatment.  She wants to try to go home.  She knows that she can come back if pain is uncontrollable with oral meds.  She's not had narcotics filled since October, so she's d/c with percocet.  She had not been  taking her as she was told to stop it by gi in december due to no cp.  However, she has cp, so I told her to take it until she sees GI.  She is to avoid alcohol and tobacco. HTN/Hyperglycemia:  chronic.  Pt encouraged to take her meds as directed.  Reevaluation:  After the interventions noted above, I reevaluated the patient and found that they have :improved   Social Determinants of Health:  Lives at home   Dispostion:  After consideration of the diagnostic results and the patients response to treatment, I feel that the patent would benefit from discharge with outpatient f/u.          Final Clinical Impression(s) / ED Diagnoses Final diagnoses:  Alcohol-induced acute pancreatitis, unspecified complication status  Hyperglycemia due to diabetes mellitus (HCC)  Dehydration  Hypertension, unspecified type    Rx / DC Orders ED Discharge Orders          Ordered    oxyCODONE -acetaminophen  (PERCOCET/ROXICET) 5-325 MG tablet  Every 6 hours PRN        12/09/23 1032    ondansetron  (ZOFRAN -ODT) 4 MG disintegrating tablet  Every 8 hours PRN        12/09/23 1032              Erminia Mcnew, MD 12/09/23 1037

## 2023-12-09 NOTE — Telephone Encounter (Signed)
 Copied from CRM [id number]. Topic: Clinical - Medication Refill >> Dec 09, 2023  1:59 PM Rosaria Common wrote: Most Recent Primary Care Visit:  Provider: Eliane Grooms T  Department: [facility]  Visit Type: ACUTE  Date: 08/15/2023  Medication: insulin  glargine (LANTUS  SOLOSTAR) 100 UNIT/ML Solostar Pen  Has the patient contacted their pharmacy? Yes (Agent: If no, request that the patient contact the pharmacy for the refill. If patient does not wish to contact the pharmacy document the reason why and proceed with request.) (Agent: If yes, when and what did the pharmacy advise?)  Is this the correct pharmacy for this prescription? Yes If no, delete pharmacy and type the correct one.  This is the patient's preferred pharmacy:  [id number] - [place], [place] - [address] AT [facility] [address] [place] [address] Phone: [phone] Fax: [phone]   Has the prescription been filled recently? No  Is the patient out of the medication? No  Has the patient been seen for an appointment in the last year OR does the patient have an upcoming appointment? Yes  Can we respond through MyChart? Yes  Agent: Please be advised that Rx refills may take up to 3 business days. We ask that you follow-up with your pharmacy.

## 2023-12-09 NOTE — ED Notes (Signed)
 Pt asked about BP medication. Pt states her PCP took her off d/t her insulin .

## 2023-12-11 NOTE — Telephone Encounter (Signed)
 Called [facility] and spoke to [place], Teacher, early years/pre. Was advised pt has 2 more refills left on this prescription. Advised that the pharmacy is out "but the truck has not delivered today." Bridgette Campus stated she will contact the pt when med is ready.   Requested Prescriptions  Pending Prescriptions Disp Refills   insulin  glargine (LANTUS  SOLOSTAR) 100 UNIT/ML Solostar Pen 15 mL 5    Sig: Inject 20 Units into the skin daily.     Endocrinology:  Diabetes - Insulins Failed - 12/11/2023 11:04 AM      Failed - HBA1C is between 0 and 7.9 and within 180 days    Hgb A1c MFr Bld  Date Value Ref Range Status  08/15/2023 >14.0 (H) <5.7 % of total Hgb Final    Comment:    Verified by repeat analysis. . For someone without known diabetes, a hemoglobin A1c value of 6.5% or greater indicates that they may have  diabetes and this should be confirmed with a follow-up  test. . For someone with known diabetes, a value <7% indicates  that their diabetes is well controlled and a value  greater than or equal to 7% indicates suboptimal  control. A1c targets should be individualized based on  duration of diabetes, age, comorbid conditions, and  other considerations. . Currently, no consensus exists regarding use of hemoglobin A1c for diagnosis of diabetes for children. .          Failed - Valid encounter within last 6 months    Recent Outpatient Visits           3 months ago Type 2 diabetes mellitus with hyperglycemia, without long-term current use of insulin  [facility])   [place] [facility] Austine Lefort, MD   6 months ago Alcohol-induced acute pancreatitis, unspecified complication status   [place] [facility] Austine Lefort, MD   11 months ago Bipolar 1 disorder [facility])   [place] [facility] Austine Lefort, MD   1 year ago PICC line infection, initial encounter   [facility] Jenelle Mis, FNP   1 year ago  Chronic pancreatitis, unspecified pancreatitis type [facility])    [facility] Pick[place], Cisco Crest, MD

## 2024-01-15 ENCOUNTER — Encounter: Payer: Self-pay | Admitting: Family Medicine

## 2024-01-15 ENCOUNTER — Ambulatory Visit: Payer: Self-pay | Admitting: *Deleted

## 2024-01-15 NOTE — Telephone Encounter (Signed)
 Copied from CRM [id number]. Topic: Clinical - Red Word Triage >> Jan 15, 2024 10:19 AM Rosaria Common wrote: Red Word that prompted transfer to Nurse Triage: Pt seeking medical advice due to pain due to a fractured rib. Reason for Disposition  [1] MODERATE pain (e.g., interferes with normal activities) AND [2] high-risk adult (e.g., age > 60 years, osteoporosis, chronic steroid use)  Answer Assessment - Initial Assessment Questions 1. MECHANISM: How did the injury happen?     I fell on Sat.   This morning I woke up vomiting from the pain.   They did an x ray.   Left side I have a fractured rib.    The Tylenol  is not helping the pain.   Also gave me a Lidocaine  patches.   They are not working. 2. ONSET: When did the injury happen? (Minutes or hours ago)     Sat when I fell. 3. LOCATION: Where on the chest is the injury located?     Left 7th and 8th rib. 4. APPEARANCE: What does the injury look like?    Per urgent care I have contusions.  A little swollen on outside. 5. BLEEDING: Is there any bleeding now? If Yes, ask: How long has it been bleeding?     No 6. SEVERITY: Any difficulty with breathing?     Yes it's very painful.   I'm also a smoker.   Coughing. 7. SIZE: For cuts, bruises, or swelling, ask: How large is it? (e.g., inches or centimeters)     Mild swelling 8. PAIN: Is there pain? If Yes, ask: How bad is the pain?   (e.g., Scale 1-10; or mild, moderate, severe)     Yes severe pain with any movement and breathing. 9. TETANUS: For any breaks in the skin, ask: When was the last tetanus booster?     N/A 10. PREGNANCY: Is there any chance you are pregnant? When was your last menstrual period?       Not asked  Protocols used: Chest Injury-A-AH Pt seen at urgent care this morning 6/11 and has a fractured rib.   Fell on Sat.   Calling in for pain medication.   Urgent care not prescribe anything stronger.  She's taking Extra strength Tylenol  and using Lidocaine  patches  prescribed by urgent care provider without relief.   Told to contact PCP.       FYI Only or Action Required?: Action required by provider  Patient was last seen in primary care on 08/15/2023 by Austine Lefort, MD. Called Nurse Triage reporting Chest Pain. Symptoms began several days ago. Interventions attempted: OTC medications: Extra Strength Tylenol  and Lidocaine  patches. Symptoms are: rapidly worsening.  Triage Disposition: See Physician Within 24 Hours  Patient/caregiver understands and will follow disposition?:   Yes with modifications.  High priority message sent since pt seen and x rayed at urgent care this morning.   Requesting stronger pain medication.

## 2024-01-16 ENCOUNTER — Ambulatory Visit (HOSPITAL_COMMUNITY)
Admission: RE | Admit: 2024-01-16 | Discharge: 2024-01-16 | Disposition: A | Discharge status: Home / Self Care | Source: Ambulatory Visit | Attending: Family Medicine | Admitting: Family Medicine

## 2024-01-16 ENCOUNTER — Encounter: Payer: Self-pay | Admitting: Family Medicine

## 2024-01-16 ENCOUNTER — Ambulatory Visit: Admitting: Family Medicine

## 2024-01-16 VITALS — BP 137/115 | HR 105 | Ht 64.0 in | Wt 166.2 lb

## 2024-01-16 DIAGNOSIS — R091 Pleurisy: Secondary | ICD-10-CM | POA: Insufficient documentation

## 2024-01-16 DIAGNOSIS — Z7984 Long term (current) use of oral hypoglycemic drugs: Secondary | ICD-10-CM

## 2024-01-16 DIAGNOSIS — E1165 Type 2 diabetes mellitus with hyperglycemia: Secondary | ICD-10-CM

## 2024-01-16 MED ORDER — OXYCODONE-ACETAMINOPHEN 5-325 MG PO TABS: 1.0000 | ORAL_TABLET | ORAL | 0 refills | Status: AC | PRN

## 2024-01-16 MED ORDER — FREESTYLE LIBRE 3 PLUS SENSOR MISC: 5 refills | Status: DC

## 2024-01-16 NOTE — Progress Notes (Signed)
 Subjective:    Patient ID: Jillian Shepherd, female    DOB: [birth year], 38 y.o.   MRN: [id number] Patient has a history of alcohol induced pancreatitis.  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  Has not been seen in some time.  She states checking her blood sugars.  She is taking 20 units of insulin  daily and she states that her blood sugars are typically 80-120.  Recently she went to the emergency room after drinking alcohol and developing pancreatitis again.  In the emergency room, her blood sugars were well over 300.  However that is not the reason she is here today.  She recently fell while walking.  She landed on her left chest and sustained fractures to the 7th and 8th ribs.  She reports pleurisy.  She reports worsening chest pain.  She went to an urgent care yesterday and x-rays confirmed the fracture.  Today on exam however she has diminished breath sounds in the left base and in the left mid axillary line.  This is markedly asymmetric to the right side. Past Medical History:  Diagnosis Date   Abdominal pain    Alcohol abuse    B12 deficiency    Cancer (HCC)    squmaous cell skin cancer    Collagen vascular disease (HCC)    Diabetes mellitus without complication (HCC)    Hypokalemia    Liddle's syndrome    Obesity (BMI 35.0-39.9 without comorbidity)    Pancreatitis    Pleural effusion    Splenic hemorrhage    Past Surgical History:  Procedure Laterality Date   ADENOIDECTOMY     CHEST TUBE INSERTION     COLONOSCOPY WITH PROPOFOL  N/A 10/06/2020   Procedure: COLONOSCOPY WITH PROPOFOL ;  Surgeon: Selena Daily, MD;  Location: [facility] ENDOSCOPY;  Service: Gastroenterology;  Laterality: N/A;  COVID POSITIVE 08/23/2020   ESOPHAGOGASTRODUODENOSCOPY (EGD) WITH PROPOFOL  N/A 10/06/2020   Procedure: ESOPHAGOGASTRODUODENOSCOPY (EGD) WITH PROPOFOL ;  Surgeon: Selena Daily, MD;  Location: [facility] ENDOSCOPY;  Service:  Gastroenterology;  Laterality: N/A;   skin cancer removal Left    leg   TONSILLECTOMY     Current Outpatient Medications on File Prior to Visit  Medication Sig Dispense Refill   Blood Glucose Monitoring Suppl DEVI 1 each by Does not apply route in the morning, at noon, and at bedtime. May substitute to any manufacturer covered by patient's insurance. 1 each 0   insulin  glargine (LANTUS  SOLOSTAR) 100 UNIT/ML Solostar Pen Inject 20 Units into the skin daily. 15 mL 5   Insulin  Pen Needle (SURE-FINE PEN NEEDLES) 31G X 8 MM MISC Use to administer insulin  daily. 100 each 1   lidocaine  (XYLOCAINE ) 2 % solution Use as directed 10 mLs in the mouth or throat every 3 (three) hours as needed. 100 mL 0   ondansetron  (ZOFRAN -ODT) 4 MG disintegrating tablet Take 1 tablet (4 mg total) by mouth every 8 (eight) hours as needed. 20 tablet 0   amitriptyline (ELAVIL) 50 MG tablet Take 50 mg by mouth at bedtime as needed for sleep.     amoxicillin  (AMOXIL ) 875 MG tablet Take 1 tablet (875 mg total) by mouth 2 (two) times daily. 20 tablet 0   metFORMIN  (GLUCOPHAGE -XR) 500 MG 24 hr tablet Take 4 tablets (2,000 mg total) by mouth daily with breakfast. 180 tablet 1   No current facility-administered medications on file prior to visit.   No Known Allergies Social History   Socioeconomic History  Marital status: Married    Spouse name: Not on file   Number of children: Not on file   Years of education: Not on file   Highest education level: Not on file  Occupational History   Not on file  Tobacco Use   Smoking status: Every Day    Current packs/day: 0.25    Average packs/day: 0.3 packs/day for 9.0 years (2.3 ttl pk-yrs)    Types: Cigarettes    Passive exposure: Current   Smokeless tobacco: Never  Vaping Use   Vaping status: Never Used  Substance and Sexual Activity   Alcohol use: Yes    Alcohol/week: 2.0 standard drinks of alcohol    Types: 2 Cans of beer per week   Drug use: No   Sexual activity:  Yes    Birth control/protection: None  Other Topics Concern   Not on file  Social History Narrative   ** Merged History Encounter **       Social Drivers of Health   Financial Resource Strain: Not on file  Food Insecurity: No Food Insecurity (04/05/2023)   Hunger Vital Sign    Worried About Running Out of Food in the Last Year: Never true    Ran Out of Food in the Last Year: Never true  Transportation Needs: No Transportation Needs (04/05/2023)   PRAPARE - Administrator, Civil Service (Medical): No    Lack of Transportation (Non-Medical): No  Physical Activity: Not on file  Stress: Not on file  Social Connections: Unknown (12/19/2021)   Received from [facility]   Social Network    Social Network: Not on file  Intimate Partner Violence: Not At Risk (04/05/2023)   Humiliation, Afraid, Rape, and Kick questionnaire    Fear of Current or Ex-Partner: No    Emotionally Abused: No    Physically Abused: No    Sexually Abused: No     Review of Systems  All other systems reviewed and are negative.      Objective:   Physical Exam Vitals reviewed.  Constitutional:      General: She is not in acute distress.    Appearance: Normal appearance. She is normal weight. She is not ill-appearing, toxic-appearing or diaphoretic.  HENT:     Nose: Nose normal. No congestion or rhinorrhea.     Mouth/Throat:     Pharynx: Oropharynx is clear. No oropharyngeal exudate or posterior oropharyngeal erythema.   Eyes:     General: No scleral icterus.    Conjunctiva/sclera: Conjunctivae normal.    Cardiovascular:     Rate and Rhythm: Normal rate and regular rhythm.     Pulses: Normal pulses.     Heart sounds: Normal heart sounds. No murmur heard.    No friction rub. No gallop.  Pulmonary:     Effort: Pulmonary effort is normal. No respiratory distress.     Breath sounds: No stridor. Examination of the left-middle field reveals decreased breath sounds. Examination of the  left-lower field reveals decreased breath sounds. Decreased breath sounds present. No wheezing, rhonchi or rales.  Chest:     Chest wall: Tenderness present.  Abdominal:     General: Abdomen is flat. Bowel sounds are normal. There is no distension.     Palpations: Abdomen is soft. There is no mass.     Tenderness: There is abdominal tenderness. There is no right CVA tenderness, left CVA tenderness, guarding or rebound.     Hernia: No hernia is present.   Musculoskeletal:  Cervical back: Neck supple.   Neurological:     Mental Status: She is alert.           Assessment & Plan:  Type 2 diabetes mellitus with hyperglycemia, without long-term current use of insulin  (HCC) - Plan: Hemoglobin A1c, CBC with Differential/Platelet, Comprehensive metabolic panel with GFR  Pleurisy - Plan: DG Chest 2 View I am concerned the patient is developing a pleural effusion.  Obtain x-ray to rule out pneumothorax and ensure there is no pneumonia and to determine the size of the pleural effusion.  I did give the patient Percocet to take as needed for rib pain as she is obviously in pain today.  Strongly recommended abstinence from alcohol.  While the patient is here I will check a CBC a CMP and a hemoglobin A1c.  I would like to keep her A1c less than 7.  Her recent blood sugars mainly concerned that her sugars are not as well-controlled as she indicates

## 2024-01-17 ENCOUNTER — Ambulatory Visit: Payer: Self-pay | Admitting: Family Medicine

## 2024-01-17 LAB — COMPREHENSIVE METABOLIC PANEL WITH GFR
AG Ratio: 1.2 (calc) (ref 1.0–2.5)
ALT: 10 U/L (ref 6–29)
AST: 13 U/L (ref 10–30)
Albumin: 4.1 g/dL (ref 3.6–5.1)
Alkaline phosphatase (APISO): 132 U/L — ABNORMAL HIGH (ref 31–125)
BUN: 10 mg/dL (ref 7–25)
CO2: 25 mmol/L (ref 20–32)
Calcium: 9.7 mg/dL (ref 8.6–10.2)
Chloride: 96 mmol/L — ABNORMAL LOW (ref 98–110)
Creat: 0.62 mg/dL (ref 0.50–0.97)
Globulin: 3.4 g/dL (ref 1.9–3.7)
Glucose, Bld: 398 mg/dL — ABNORMAL HIGH (ref 65–99)
Potassium: 4 mmol/L (ref 3.5–5.3)
Sodium: 132 mmol/L — ABNORMAL LOW (ref 135–146)
Total Bilirubin: 0.7 mg/dL (ref 0.2–1.2)
Total Protein: 7.5 g/dL (ref 6.1–8.1)
eGFR: 116 mL/min/{1.73_m2} (ref >=60)

## 2024-01-17 LAB — CBC WITH DIFFERENTIAL/PLATELET
Absolute Lymphocytes: 2482 {cells}/uL (ref 850–3900)
Absolute Monocytes: 391 {cells}/uL (ref 200–950)
Basophils Absolute: 17 {cells}/uL (ref 0–200)
Basophils Relative: 0.2 %
Eosinophils Absolute: 51 {cells}/uL (ref 15–500)
Eosinophils Relative: 0.6 %
HCT: 49.8 % — ABNORMAL HIGH (ref 35.0–45.0)
Hemoglobin: 16.2 g/dL — ABNORMAL HIGH (ref 11.7–15.5)
MCH: 31 pg (ref 27.0–33.0)
MCHC: 32.5 g/dL (ref 32.0–36.0)
MCV: 95.2 fL (ref 80.0–100.0)
MPV: 10.3 fL (ref 7.5–12.5)
Monocytes Relative: 4.6 %
Neutro Abs: 5559 {cells}/uL (ref 1500–7800)
Neutrophils Relative %: 65.4 %
Platelets: 217 10*3/uL (ref 140–400)
RBC: 5.23 10*6/uL — ABNORMAL HIGH (ref 3.80–5.10)
RDW: 13.7 % (ref 11.0–15.0)
Total Lymphocyte: 29.2 %
WBC: 8.5 10*3/uL (ref 3.8–10.8)

## 2024-01-17 LAB — HEMOGLOBIN A1C
Hgb A1c MFr Bld: 12.5 % — ABNORMAL HIGH (ref <5.7)
Mean Plasma Glucose: 312 mg/dL
eAG (mmol/L): 17.3 mmol/L

## 2024-01-22 ENCOUNTER — Telehealth: Payer: Self-pay

## 2024-01-22 ENCOUNTER — Other Ambulatory Visit (HOSPITAL_COMMUNITY): Payer: Self-pay

## 2024-01-22 NOTE — Telephone Encounter (Signed)
 Pharmacy Patient Advocate Encounter   Received notification from CoverMyMeds that prior authorization for FreeStyle Libre 3 Plus Sensoris required/requested.   Insurance verification completed.   The patient is insured through [facility] .   Per test claim: PA required; PA submitted to above mentioned insurance via CoverMyMeds Key/confirmation #/EOC (Key: [id number])    Status is pending

## 2024-01-23 NOTE — Telephone Encounter (Signed)
 Pharmacy Patient Advocate Encounter  Received notification from [facility] that Prior Authorization for FreeStyle Libre 3 Plus Sensor has been APPROVED from 6.18.25 to 12.18.25. Ran test claim, Copay is $4.00. This test claim was processed through [facility]- copay amounts may vary at other pharmacies due to pharmacy/plan contracts, or as the patient moves through the different stages of their insurance plan.   PA #/Case ID/Reference #: (Key: [id number])

## 2024-02-01 ENCOUNTER — Inpatient Hospital Stay (HOSPITAL_COMMUNITY)

## 2024-02-01 ENCOUNTER — Encounter (HOSPITAL_COMMUNITY): Payer: Self-pay | Admitting: Emergency Medicine

## 2024-02-01 ENCOUNTER — Emergency Department (HOSPITAL_COMMUNITY)

## 2024-02-01 ENCOUNTER — Other Ambulatory Visit: Payer: Self-pay

## 2024-02-01 ENCOUNTER — Inpatient Hospital Stay (HOSPITAL_COMMUNITY)
Admission: EM | Admit: 2024-02-01 | Discharge: 2024-02-04 | DRG: 439 | Disposition: A | Discharge status: Home / Self Care | Attending: Internal Medicine | Admitting: Internal Medicine

## 2024-02-01 DIAGNOSIS — E669 Obesity, unspecified: Secondary | ICD-10-CM | POA: Diagnosis present

## 2024-02-01 DIAGNOSIS — E1165 Type 2 diabetes mellitus with hyperglycemia: Secondary | ICD-10-CM | POA: Diagnosis present

## 2024-02-01 DIAGNOSIS — F1010 Alcohol abuse, uncomplicated: Secondary | ICD-10-CM | POA: Diagnosis present

## 2024-02-01 DIAGNOSIS — Z8249 Family history of ischemic heart disease and other diseases of the circulatory system: Secondary | ICD-10-CM | POA: Diagnosis not present

## 2024-02-01 DIAGNOSIS — Z85828 Personal history of other malignant neoplasm of skin: Secondary | ICD-10-CM

## 2024-02-01 DIAGNOSIS — N390 Urinary tract infection, site not specified: Secondary | ICD-10-CM | POA: Diagnosis present

## 2024-02-01 DIAGNOSIS — Z79899 Other long term (current) drug therapy: Secondary | ICD-10-CM

## 2024-02-01 DIAGNOSIS — K863 Pseudocyst of pancreas: Secondary | ICD-10-CM | POA: Diagnosis present

## 2024-02-01 DIAGNOSIS — E876 Hypokalemia: Secondary | ICD-10-CM | POA: Diagnosis present

## 2024-02-01 DIAGNOSIS — R112 Nausea with vomiting, unspecified: Secondary | ICD-10-CM | POA: Diagnosis present

## 2024-02-01 DIAGNOSIS — I10 Essential (primary) hypertension: Secondary | ICD-10-CM | POA: Diagnosis present

## 2024-02-01 DIAGNOSIS — F17210 Nicotine dependence, cigarettes, uncomplicated: Secondary | ICD-10-CM | POA: Diagnosis present

## 2024-02-01 DIAGNOSIS — Z7984 Long term (current) use of oral hypoglycemic drugs: Secondary | ICD-10-CM | POA: Diagnosis not present

## 2024-02-01 DIAGNOSIS — Z833 Family history of diabetes mellitus: Secondary | ICD-10-CM | POA: Diagnosis not present

## 2024-02-01 DIAGNOSIS — R1010 Upper abdominal pain, unspecified: Secondary | ICD-10-CM

## 2024-02-01 DIAGNOSIS — E871 Hypo-osmolality and hyponatremia: Secondary | ICD-10-CM | POA: Diagnosis not present

## 2024-02-01 DIAGNOSIS — Z794 Long term (current) use of insulin: Principal | ICD-10-CM

## 2024-02-01 DIAGNOSIS — K861 Other chronic pancreatitis: Secondary | ICD-10-CM | POA: Diagnosis present

## 2024-02-01 DIAGNOSIS — K219 Gastro-esophageal reflux disease without esophagitis: Secondary | ICD-10-CM | POA: Diagnosis present

## 2024-02-01 DIAGNOSIS — K859 Acute pancreatitis without necrosis or infection, unspecified: Principal | ICD-10-CM | POA: Diagnosis present

## 2024-02-01 DIAGNOSIS — Z6829 Body mass index (BMI) 29.0-29.9, adult: Secondary | ICD-10-CM

## 2024-02-01 DIAGNOSIS — R109 Unspecified abdominal pain: Secondary | ICD-10-CM | POA: Diagnosis present

## 2024-02-01 HISTORY — DX: Essential (primary) hypertension: I10

## 2024-02-01 LAB — URINALYSIS, ROUTINE W REFLEX MICROSCOPIC
Bilirubin Urine: NEGATIVE
Glucose, UA: >=500 mg/dL — AB
Hgb urine dipstick: NEGATIVE
Ketones, ur: 20 mg/dL — AB
Leukocytes,Ua: NEGATIVE
Nitrite: POSITIVE — AB
Protein, ur: 30 mg/dL — AB
Specific Gravity, Urine: 1.036 — ABNORMAL HIGH (ref 1.005–1.030)
pH: 5 (ref 5.0–8.0)

## 2024-02-01 LAB — GLUCOSE, CAPILLARY
Glucose-Capillary: 239 mg/dL — ABNORMAL HIGH (ref 70–99)
Glucose-Capillary: 190 mg/dL — ABNORMAL HIGH (ref 70–99)
Glucose-Capillary: 169 mg/dL — ABNORMAL HIGH (ref 70–99)
Glucose-Capillary: 182 mg/dL — ABNORMAL HIGH (ref 70–99)

## 2024-02-01 LAB — LIPID PANEL
Cholesterol: 166 mg/dL (ref 0–200)
HDL: 39 mg/dL — ABNORMAL LOW (ref >40)
LDL Cholesterol: 99 mg/dL (ref 0–99)
Total CHOL/HDL Ratio: 4.3 ratio
Triglycerides: 140 mg/dL (ref <150)
VLDL: 28 mg/dL (ref 0–40)

## 2024-02-01 LAB — CBC
HCT: 47.5 % — ABNORMAL HIGH (ref 36.0–46.0)
HCT: 43.3 % (ref 36.0–46.0)
Hemoglobin: 16.3 g/dL — ABNORMAL HIGH (ref 12.0–15.0)
Hemoglobin: 14.7 g/dL (ref 12.0–15.0)
MCH: 31.4 pg (ref 26.0–34.0)
MCH: 31.3 pg (ref 26.0–34.0)
MCHC: 34.3 g/dL (ref 30.0–36.0)
MCHC: 33.9 g/dL (ref 30.0–36.0)
MCV: 91.5 fL (ref 80.0–100.0)
MCV: 92.3 fL (ref 80.0–100.0)
Platelets: 217 10*3/uL (ref 150–400)
Platelets: 195 10*3/uL (ref 150–400)
RBC: 5.19 MIL/uL — ABNORMAL HIGH (ref 3.87–5.11)
RBC: 4.69 MIL/uL (ref 3.87–5.11)
RDW: 14.3 % (ref 11.5–15.5)
RDW: 14.4 % (ref 11.5–15.5)
WBC: 7.2 10*3/uL (ref 4.0–10.5)
WBC: 6.6 10*3/uL (ref 4.0–10.5)
nRBC: 0 % (ref 0.0–0.2)
nRBC: 0 % (ref 0.0–0.2)

## 2024-02-01 LAB — COMPREHENSIVE METABOLIC PANEL WITH GFR
ALT: 17 U/L (ref 0–44)
AST: 19 U/L (ref 15–41)
Albumin: 3.7 g/dL (ref 3.5–5.0)
Alkaline Phosphatase: 119 U/L (ref 38–126)
Anion gap: 14 (ref 5–15)
BUN: 11 mg/dL (ref 6–20)
CO2: 24 mmol/L (ref 22–32)
Calcium: 8.9 mg/dL (ref 8.9–10.3)
Chloride: 97 mmol/L — ABNORMAL LOW (ref 98–111)
Creatinine, Ser: 0.52 mg/dL (ref 0.44–1.00)
GFR, Estimated: >60 mL/min (ref >60)
Glucose, Bld: 274 mg/dL — ABNORMAL HIGH (ref 70–99)
Potassium: 2.8 mmol/L — ABNORMAL LOW (ref 3.5–5.1)
Sodium: 135 mmol/L (ref 135–145)
Total Bilirubin: 0.7 mg/dL (ref 0.0–1.2)
Total Protein: 7.5 g/dL (ref 6.5–8.1)

## 2024-02-01 LAB — LIPASE, BLOOD: Lipase: 1253 U/L — ABNORMAL HIGH (ref 11–51)

## 2024-02-01 LAB — CREATININE, SERUM
Creatinine, Ser: 0.4 mg/dL — ABNORMAL LOW (ref 0.44–1.00)
GFR, Estimated: >60 mL/min (ref >60)

## 2024-02-01 LAB — ETHANOL: Alcohol, Ethyl (B): <15 mg/dL (ref <15)

## 2024-02-01 LAB — PREGNANCY, URINE: Preg Test, Ur: NEGATIVE

## 2024-02-01 LAB — MAGNESIUM: Magnesium: 1.7 mg/dL (ref 1.7–2.4)

## 2024-02-01 LAB — PHOSPHORUS: Phosphorus: 3 mg/dL (ref 2.5–4.6)

## 2024-02-01 LAB — HIV ANTIBODY (ROUTINE TESTING W REFLEX): HIV Screen 4th Generation wRfx: NONREACTIVE

## 2024-02-01 MED ORDER — INSULIN GLARGINE-YFGN 100 UNIT/ML ~~LOC~~ SOLN
10.0000 [IU] | Freq: Every day | SUBCUTANEOUS | Status: DC
  Administered 2024-02-01 – 2024-02-03 (×3): 10 [IU] via SUBCUTANEOUS
  Filled 2024-02-01 (×4): qty 0.1

## 2024-02-01 MED ORDER — POTASSIUM CHLORIDE 10 MEQ/100ML IV SOLN
10.0000 meq | INTRAVENOUS | Status: AC
  Administered 2024-02-01 (×4): 10 meq via INTRAVENOUS
  Filled 2024-02-01 (×4): qty 100

## 2024-02-01 MED ORDER — ONDANSETRON HCL 4 MG/2ML IJ SOLN
4.0000 mg | Freq: Once | INTRAMUSCULAR | Status: AC
  Administered 2024-02-01: 4 mg via INTRAVENOUS
  Filled 2024-02-01: qty 2

## 2024-02-01 MED ORDER — SODIUM CHLORIDE 0.9 % IV BOLUS
1000.0000 mL | Freq: Once | INTRAVENOUS | Status: AC
  Administered 2024-02-01: 1000 mL via INTRAVENOUS

## 2024-02-01 MED ORDER — PROCHLORPERAZINE EDISYLATE 10 MG/2ML IJ SOLN
10.0000 mg | Freq: Four times a day (QID) | INTRAMUSCULAR | Status: DC | PRN
  Administered 2024-02-01 – 2024-02-03 (×3): 10 mg via INTRAVENOUS
  Filled 2024-02-01 (×3): qty 2

## 2024-02-01 MED ORDER — INSULIN ASPART 100 UNIT/ML IJ SOLN: 0.0000 [IU] | Freq: Every day | INTRAMUSCULAR | Status: DC

## 2024-02-01 MED ORDER — POTASSIUM CHLORIDE CRYS ER 20 MEQ PO TBCR
40.0000 meq | EXTENDED_RELEASE_TABLET | Freq: Once | ORAL | Status: AC
  Administered 2024-02-01: 40 meq via ORAL
  Filled 2024-02-01: qty 2

## 2024-02-01 MED ORDER — HYDROMORPHONE HCL 1 MG/ML IJ SOLN
1.0000 mg | Freq: Once | INTRAMUSCULAR | Status: AC
  Administered 2024-02-01: 1 mg via INTRAVENOUS
  Filled 2024-02-01: qty 1

## 2024-02-01 MED ORDER — LACTATED RINGERS IV SOLN: INTRAVENOUS | Status: DC

## 2024-02-01 MED ORDER — IOHEXOL 300 MG/ML  SOLN
100.0000 mL | Freq: Once | INTRAMUSCULAR | Status: AC | PRN
  Administered 2024-02-01: 100 mL via INTRAVENOUS

## 2024-02-01 MED ORDER — HYDRALAZINE HCL 20 MG/ML IJ SOLN
10.0000 mg | INTRAMUSCULAR | Status: DC | PRN
  Administered 2024-02-03: 10 mg via INTRAVENOUS
  Filled 2024-02-01: qty 1

## 2024-02-01 MED ORDER — PANTOPRAZOLE SODIUM 40 MG IV SOLR
40.0000 mg | INTRAVENOUS | Status: DC
  Administered 2024-02-01 – 2024-02-04 (×4): 40 mg via INTRAVENOUS
  Filled 2024-02-01 (×4): qty 10

## 2024-02-01 MED ORDER — INSULIN ASPART 100 UNIT/ML IJ SOLN
0.0000 [IU] | Freq: Three times a day (TID) | INTRAMUSCULAR | Status: DC
  Administered 2024-02-01: 3 [IU] via SUBCUTANEOUS
  Administered 2024-02-01: 5 [IU] via SUBCUTANEOUS
  Administered 2024-02-01 – 2024-02-02 (×2): 3 [IU] via SUBCUTANEOUS
  Administered 2024-02-02 – 2024-02-04 (×3): 2 [IU] via SUBCUTANEOUS

## 2024-02-01 MED ORDER — LISINOPRIL 10 MG PO TABS
10.0000 mg | ORAL_TABLET | Freq: Every day | ORAL | Status: DC
  Administered 2024-02-01: 10 mg via ORAL
  Filled 2024-02-01 (×2): qty 1

## 2024-02-01 MED ORDER — HYDROMORPHONE HCL 1 MG/ML IJ SOLN
0.5000 mg | INTRAMUSCULAR | Status: DC | PRN
  Administered 2024-02-01 (×2): 0.5 mg via INTRAVENOUS
  Filled 2024-02-01 (×2): qty 0.5

## 2024-02-01 MED ORDER — SODIUM CHLORIDE 0.9 % IV SOLN
1.0000 g | Freq: Once | INTRAVENOUS | Status: AC
  Administered 2024-02-01: 1 g via INTRAVENOUS
  Filled 2024-02-01: qty 10

## 2024-02-01 MED ORDER — ENOXAPARIN SODIUM 40 MG/0.4ML IJ SOSY
40.0000 mg | PREFILLED_SYRINGE | INTRAMUSCULAR | Status: DC
  Filled 2024-02-01 (×2): qty 0.4

## 2024-02-01 MED ORDER — SODIUM CHLORIDE 0.9 % IV SOLN
1.0000 g | INTRAVENOUS | Status: DC
  Administered 2024-02-02 – 2024-02-03 (×2): 1 g via INTRAVENOUS
  Filled 2024-02-01 (×2): qty 10

## 2024-02-01 MED ORDER — HYDROMORPHONE HCL 1 MG/ML IJ SOLN
0.5000 mg | INTRAMUSCULAR | Status: DC | PRN
  Administered 2024-02-01 – 2024-02-04 (×32): 0.5 mg via INTRAVENOUS
  Filled 2024-02-01 (×32): qty 0.5

## 2024-02-01 NOTE — ED Triage Notes (Signed)
 C/o abdominal pain starting tonight after getting home from the movies. Patient states pain worsened and woke her up out of her sleep. States she has been vomiting since waking up. Hx of pancreatitis.

## 2024-02-01 NOTE — H&P (Signed)
 History and Physical    Patient: Jillian Shepherd FMW:[id number] DOB: [birth year] DOA: 02/01/2024 DOS: the patient was seen and examined on 02/01/2024 PCP: Duanne Butler DASEN, MD  Patient coming from: Home  Chief Complaint:  Chief Complaint  Patient presents with   Abdominal Pain   HPI: Jillian Shepherd is a 38 y.o. female with medical history significant of  Liddle syndrome, alcoholic pancreatitis/chronic pancreatitis with pseudocyst formation with recurrent admissions for same who presents to ED emergency department due to upper abdominal pain which started in the evening yesterday after returning home from a movie.  Pain was sharp and burning in nature, it was in epigastric area with radiation to right upper abdomen to the back.  It was associated with nausea and vomiting.  Last alcohol drink was 5 days ago.  She endorses consuming alcohol over the past month due to having a rough month .  ED Course:  In the emergency department, BP was 140/25, other vital signs were within normal range.  Workup in the ED showed WBC 7.2, hemoglobin 16.3, hematocrit 47.5, MCV 91.5, platelets 217.  BMP was normal except for potassium of 2.8, chloride 97, blood glucose was 274, lipase 1, 253, urinalysis was positive for nitrite and glycosuria.  Pregnancy test was negative. CT abdomen and pelvis with contrast showed only subtle pancreatitis since last month, with mild progression of inflammation at the pancreatic head, uncinate and secondarily involving the duodenum C-loop since 12/09/2023. She was treated with IV Dilaudid , Zofran  and IV hydration.  IV ceftriaxone  was given due to presumed UTI.  Review of Systems: Review of systems as noted in the HPI. All other systems reviewed and are negative.   Past Medical History:  Diagnosis Date   Abdominal pain    Alcohol abuse    B12 deficiency    Cancer (HCC)    squmaous cell skin cancer    Collagen vascular disease (HCC)    Diabetes mellitus without complication  (HCC)    Hypertension    Hypokalemia    Liddle's syndrome    Obesity (BMI 35.0-39.9 without comorbidity)    Pancreatitis    Pleural effusion    Splenic hemorrhage    Past Surgical History:  Procedure Laterality Date   ADENOIDECTOMY     CHEST TUBE INSERTION     COLONOSCOPY WITH PROPOFOL  N/A 10/06/2020   Procedure: COLONOSCOPY WITH PROPOFOL ;  Surgeon: Unk Corinn Skiff, MD;  Location: [facility] ENDOSCOPY;  Service: Gastroenterology;  Laterality: N/A;  COVID POSITIVE 08/23/2020   ESOPHAGOGASTRODUODENOSCOPY (EGD) WITH PROPOFOL  N/A 10/06/2020   Procedure: ESOPHAGOGASTRODUODENOSCOPY (EGD) WITH PROPOFOL ;  Surgeon: Unk Corinn Skiff, MD;  Location: [facility] ENDOSCOPY;  Service: Gastroenterology;  Laterality: N/A;   skin cancer removal Left    leg   TONSILLECTOMY      Social History:  reports that she has been smoking cigarettes. She has a 2.3 pack-year smoking history. She has been exposed to tobacco smoke. She has never used smokeless tobacco. She reports current alcohol use of about 2.0 standard drinks of alcohol per week. She reports that she does not use drugs.   No Known Allergies  Family History  Problem Relation Age of Onset   Hypertension Father    Anxiety disorder Father    Depression Father    Melanoma Paternal Grandmother        of skin   Diabetes Paternal Grandmother    Cirrhosis Paternal Uncle      Prior to Admission medications   Medication Sig Start Date End Date Taking?  Authorizing Provider  amitriptyline (ELAVIL) 50 MG tablet Take 50 mg by mouth at bedtime as needed for sleep. 04/01/23   [provider]  amoxicillin  (AMOXIL ) 875 MG tablet Take 1 tablet (875 mg total) by mouth 2 (two) times daily. 10/02/23   Stuart Vernell Norris, PA-C  Blood Glucose Monitoring Suppl DEVI 1 each by Does not apply route in the morning, at noon, and at bedtime. May substitute to any manufacturer covered by patient's insurance. 08/15/23   Duanne Butler DASEN, MD  Continuous Glucose  Sensor (FREESTYLE LIBRE 3 PLUS SENSOR) MISC Change sensor every 15 days. 01/16/24   Duanne Butler DASEN, MD  insulin  glargine (LANTUS  SOLOSTAR) 100 UNIT/ML Solostar Pen Inject 20 Units into the skin daily. 08/15/23   Duanne Butler DASEN, MD  Insulin  Pen Needle (SURE-FINE PEN NEEDLES) 31G X 8 MM MISC Use to administer insulin  daily. 08/29/23   Duanne Butler DASEN, MD  lidocaine  (XYLOCAINE ) 2 % solution Use as directed 10 mLs in the mouth or throat every 3 (three) hours as needed. 10/02/23   Stuart Vernell Norris, PA-C  metFORMIN  (GLUCOPHAGE -XR) 500 MG 24 hr tablet Take 4 tablets (2,000 mg total) by mouth daily with breakfast. 09/26/23   Duanne Butler DASEN, MD  ondansetron  (ZOFRAN -ODT) 4 MG disintegrating tablet Take 1 tablet (4 mg total) by mouth every 8 (eight) hours as needed. 12/09/23   Dean Clarity, MD    Physical Exam: BP (!) 141/112 (BP Location: Right Arm)   Pulse 80   Temp 97.6 F (36.4 C) (Oral)   Resp 14   Ht 5' 3 (1.6 m)   Wt 75.7 kg   LMP 12/29/2023 (Approximate)   SpO2 96%   BMI 29.56 kg/m   General: 38 y.o. year-old female well developed well nourished in no acute distress.  Alert and oriented x3. HEENT: NCAT, EOMI Neck: Supple, trachea medial Cardiovascular: Regular rate and rhythm with no rubs or gallops.  No thyromegaly or JVD noted.  No lower extremity edema. 2/4 pulses in all 4 extremities. Respiratory: Clear to auscultation with no wheezes or rales. Good inspiratory effort. Abdomen: Soft, tender to palpation of epigastric and RUQ without guarding..  Normal bowel sounds x4 quadrants. Muskuloskeletal: No cyanosis, clubbing or edema noted bilaterally Neuro: CN II-XII intact, strength 5/5 x 4, sensation, reflexes intact Skin: No ulcerative lesions noted or rashes Psychiatry: Judgement and insight appear normal. Mood is appropriate for condition and setting          Labs on Admission:  Basic Metabolic Panel: Recent Labs  Lab 02/01/24 0228  NA 135  K 2.8*  CL 97*  CO2 24   GLUCOSE 274*  BUN 11  CREATININE 0.52  CALCIUM 8.9   Liver Function Tests: Recent Labs  Lab 02/01/24 0228  AST 19  ALT 17  ALKPHOS 119  BILITOT 0.7  PROT 7.5  ALBUMIN 3.7   Recent Labs  Lab 02/01/24 0228  LIPASE 1,253*   No results for input(s): AMMONIA  in the last 168 hours. CBC: Recent Labs  Lab 02/01/24 0228  WBC 7.2  HGB 16.3*  HCT 47.5*  MCV 91.5  PLT 217   Cardiac Enzymes: No results for input(s): CKTOTAL, CKMB, CKMBINDEX, TROPONINI in the last 168 hours.  BNP (last 3 results) No results for input(s): BNP in the last 8760 hours.  ProBNP (last 3 results) No results for input(s): PROBNP in the last 8760 hours.  CBG: No results for input(s): GLUCAP in the last 168 hours.  Radiological Exams on Admission:  CT ABDOMEN PELVIS W CONTRAST Result Date: 02/01/2024 CLINICAL DATA:  38 year old female with acute onset abdominal pain, woke her from sleep, vomiting. History of pancreatitis and pseudocysts. EXAM: CT ABDOMEN AND PELVIS WITH CONTRAST TECHNIQUE: Multidetector CT imaging of the abdomen and pelvis was performed using the standard protocol following bolus administration of intravenous contrast. RADIATION DOSE REDUCTION: This exam was performed according to the departmental dose-optimization program which includes automated exposure control, adjustment of the mA and/or kV according to patient size and/or use of iterative reconstruction technique. CONTRAST:  OMNIPAQUE  IOHEXOL  300 MG/ML  SOLN COMPARISON:  CT Abdomen and Pelvis 12/09/2023 and earlier. FINDINGS: Lower chest: Negative; minor lung base atelectasis or scarring. No pericardial or pleural effusion. Hepatobiliary: Stable liver and gallbladder. No bile duct dilatation. Pancreas: Chronically abnormal. Pancreatic body bilobed pseudocysts redemonstrated on series 2, images 32 and 34, stable since last month and up to 2.5 cm diameter individually. Adjacent dystrophic calcification of the  pancreatic body. Partial pancreatic atrophy. Peripancreatic edema/inflammation, which does appear mildly increased from last month at the pancreatic head and uncinate (coronal images 45 and 48). And secondary inflammation of the duodenum C-loop appears increased, no increased edema adjacent to the duodenum on series 2, image 43. Both the duodenum and the stomach are largely decompressed and there is chronic mass effect on the gastrosplenic ligament from a large left subdiaphragmatic pseudocyst which is 103 x 69 by 91 mm (AP by transverse by CC) with simple fluid density and an estimated volume of 320 mL. This pseudocyst size is stable. Spleen: Stable mass effect from adjacent pseudocyst. Splenic enhancement is stable and within normal limits. Adrenals/Urinary Tract: Adrenal glands and kidneys appear stable and negative. Trace gas again noted within the urinary bladder fundus on series 2, image 82, nonspecific. Stomach/Bowel: Nondilated large and small bowel loops with no active inflammation identified distal to the duodenum (described above). Normal appendix series 2, image 74. No pneumoperitoneum.  No free fluid. 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 Major arterial structures are patent. Mild abdominal aortic atherosclerosis. No lymphadenopathy. Reproductive: Negative. Other: No pelvis free fluid. Musculoskeletal: No acute osseous abnormality identified. IMPRESSION: 1. Unresolved Pancreatitis since last month, with mild progression of inflammation at the pancreatic head, uncinate, and secondarily involving the duodenum C-loop since 12/09/2023. 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 2. Trace gas within the  urinary bladder fundus, consider gas-forming UTI if not explained by recent catheterization. 3. Mild but age advanced  Aortic Atherosclerosis (ICD10-I70.0). Electronically Signed   By: VEAR Hurst M.D.   On: 02/01/2024 05:43    EKG: I independently viewed the EKG done and my findings are as followed: Normal sinus rhythm at a rate of 70 bpm  Assessment/Plan Present on Admission:  Acute on chronic pancreatitis  Abdominal pain  Nausea & vomiting  Hypokalemia  Essential hypertension  Alcohol abuse  Principal Problem:   Acute on chronic pancreatitis Active Problems:   Alcohol abuse   Nausea & vomiting   Essential hypertension   Abdominal pain   Hypokalemia   Type 2 diabetes mellitus with hyperglycemia (HCC)   UTI (urinary tract infection)  Acute on chronic pancreatitis Abdominal pain, nausea and vomiting due to above Continue IV Compazine  p.r.n Continue IV Dilaudid  p.r.n for pain Continue Protonix  Continue IV  LR at 112ml/Hr Continue full liquid diet with plan to advance diet as tolerated RUQ U/S in the morning to investigate biliary etiology (gallstone and bile duct dilatation  Hypokalemia K+ 2.8, to be replenished  T2DM with hyperglycemia Hemoglobin A1c on 01/16/2023 was 12.5 Continue Semglee 10 units nightly and adjust dose accordingly Continue ISS and hypoglycemia protocol  Presumed UTI POA Patient was started on IV ceftriaxone , we shall continue same at this time Urine culture pending  Essential hypertension Continue lisinopril  Alcohol abuse Patient was counseled on alcohol abuse cessation  DVT prophylaxis: Lovenox   Code Status: Full code  Family Communication: None at bedside  Consults: None  Severity of Illness: The appropriate patient status for this patient is INPATIENT. Inpatient status is judged to be reasonable and necessary in order to provide the required intensity of service to ensure the patient's safety. The patient's presenting symptoms, physical  exam findings, and initial radiographic and laboratory data in the context of their chronic comorbidities is felt to place them at high risk for further clinical deterioration. Furthermore, it is not anticipated that the patient will be medically stable for discharge from the hospital within 2 midnights of admission.   * I certify that at the point of admission it is my clinical judgment that the patient will require inpatient hospital care spanning beyond 2 midnights from the point of admission due to high intensity of service, high risk for further deterioration and high frequency of surveillance required.*  Author: Ozie Lupe, DO 02/01/2024 7:23 AM  For on call review [email or web address].

## 2024-02-01 NOTE — Plan of Care (Signed)

## 2024-02-01 NOTE — Progress Notes (Signed)
   02/01/24 1615  TOC Brief Assessment  Insurance and Status Reviewed  Patient has primary care physician Yes  Home environment has been reviewed From home  Prior level of function: Independent  Prior/Current Home Services No current home services  Social Drivers of Health Review SDOH reviewed interventions complete (smoking cessation added to AVS.)  Readmission risk has been reviewed Yes  Transition of care needs no transition of care needs at this time   Transition of Care Department [facility]) has reviewed patient and no TOC needs have been identified at this time. We will continue to monitor patient advancement through interdisciplinary progression rounds. If new patient transition needs arise, please place a TOC consult.

## 2024-02-01 NOTE — Progress Notes (Signed)
 PROGRESS NOTE    Zaira Iacovelli  FMW:[id number] DOB: [birth year] DOA: 02/01/2024 PCP: Duanne Butler DASEN, MD   Brief Narrative:    Jakerria Kingbird is a 38 y.o. female with medical history significant of  Liddle syndrome, alcoholic pancreatitis/chronic pancreatitis with pseudocyst formation with recurrent admissions for same who presents to ED emergency department due to upper abdominal pain which started in the evening yesterday after returning home from a movie.  She has been drinking significant amounts of alcohol recently and was admitted with acute on chronic pancreatitis.  She is also noted to have UTI and has been started on Rocephin  empirically with cultures pending.  Assessment & Plan:   Principal Problem:   Acute on chronic pancreatitis Active Problems:   Alcohol abuse   Nausea & vomiting   Essential hypertension   Abdominal pain   Hypokalemia   Type 2 diabetes mellitus with hyperglycemia (HCC)   UTI (urinary tract infection)  Assessment and Plan:   Acute on chronic pancreatitis Abdominal pain, nausea and vomiting due to above Continue IV Compazine  p.r.n Continue IV Dilaudid  p.r.n for pain Continue Protonix  Continue IV LR at 150ml/Hr after repeat bolus of normal saline Placed on n.p.o. for now RUQ U/S with no concerning findings   Hypokalemia K+ 2.8, to be replenished   T2DM with hyperglycemia Hemoglobin A1c on 01/16/2023 was 12.5 Continue Semglee 10 units nightly and adjust dose accordingly Continue ISS and hypoglycemia protocol   Presumed UTI POA Patient was started on IV ceftriaxone , we shall continue same at this time Urine culture pending   Essential hypertension Continue lisinopril   Alcohol abuse Patient was counseled on alcohol abuse cessation    DVT prophylaxis:Lovenox  Code Status: Full Family Communication: None at bedside Disposition Plan:  Status is: Inpatient Remains inpatient appropriate because: Need for IV fluids and  medications   Consultants:  None  Procedures:  None  Antimicrobials:  None   Subjective: Patient seen and evaluated today with ongoing significant amounts of pain along with nausea, denies any vomiting.  Objective: Vitals:   02/01/24 0430 02/01/24 0500 02/01/24 0645 02/01/24 0647  BP: (!) 130/99 136/87  (!) 141/112  Pulse: 78 78  80  Resp: 20 14    Temp:    97.6 F (36.4 C)  TempSrc:    Oral  SpO2: 93% 91%  96%  Weight:   75.7 kg   Height:   5' 3 (1.6 m)     Intake/Output Summary (Last 24 hours) at 02/01/2024 1339 Last data filed at 02/01/2024 0330 Gross per 24 hour  Intake 999 ml  Output --  Net 999 ml   Filed Weights   02/01/24 0208 02/01/24 0645  Weight: 75.3 kg 75.7 kg    Examination:  General exam: Appears calm and comfortable  Respiratory system: Clear to auscultation. Respiratory effort normal. Cardiovascular system: S1 & S2 heard, RRR.  Gastrointestinal system: Abdomen is soft Central nervous system: Alert and awake Extremities: No edema Skin: No significant lesions noted Psychiatry: Flat affect.    Data Reviewed: I have personally reviewed following labs and imaging studies  CBC: Recent Labs  Lab 02/01/24 0228 02/01/24 0750  WBC 7.2 6.6  HGB 16.3* 14.7  HCT 47.5* 43.3  MCV 91.5 92.3  PLT 217 195   Basic Metabolic Panel: Recent Labs  Lab 02/01/24 0228 02/01/24 0750  NA 135  --   K 2.8*  --   CL 97*  --   CO2 24  --   GLUCOSE 274*  --  BUN 11  --   CREATININE 0.52 0.40*  CALCIUM 8.9  --   MG  --  1.7  PHOS  --  3.0   GFR: Estimated Creatinine Clearance: 92 mL/min (A) (by C-G formula based on SCr of 0.4 mg/dL (L)). Liver Function Tests: Recent Labs  Lab 02/01/24 0228  AST 19  ALT 17  ALKPHOS 119  BILITOT 0.7  PROT 7.5  ALBUMIN 3.7   Recent Labs  Lab 02/01/24 0228  LIPASE 1,253*   No results for input(s): AMMONIA  in the last 168 hours. Coagulation Profile: No results for input(s): INR, PROTIME in the  last 168 hours. Cardiac Enzymes: No results for input(s): CKTOTAL, CKMB, CKMBINDEX, TROPONINI in the last 168 hours. BNP (last 3 results) No results for input(s): PROBNP in the last 8760 hours. HbA1C: No results for input(s): HGBA1C in the last 72 hours. CBG: Recent Labs  Lab 02/01/24 0827 02/01/24 1147  GLUCAP 239* 190*   Lipid Profile: Recent Labs    02/01/24 0750  CHOL 166  HDL 39*  LDLCALC 99  TRIG 859  CHOLHDL 4.3   Thyroid  Function Tests: No results for input(s): TSH, T4TOTAL, FREET4, T3FREE, THYROIDAB in the last 72 hours. Anemia Panel: No results for input(s): VITAMINB12, FOLATE, FERRITIN, TIBC, IRON, RETICCTPCT in the last 72 hours. Sepsis Labs: No results for input(s): PROCALCITON, LATICACIDVEN in the last 168 hours.  No results found for this or any previous visit (from the past 240 hours).       Radiology Studies: US  Abdomen Limited Result Date: 02/01/2024 CLINICAL DATA:  [id number] Acute on chronic pancreatitis (HCC) [id number] EXAM: ULTRASOUND ABDOMEN LIMITED RIGHT UPPER QUADRANT COMPARISON:  None Available. FINDINGS: Gallbladder: Minimal biliary sludge. No gallstones. No wall thickening or pericholecystic fluid. No sonographic Murphy's sign noted by sonographer. Common bile duct: Diameter: 2 mm Liver: Normal echogenicity. No focal lesion identified. No intrahepatic biliary ductal dilation. Portal vein is patent on color Doppler imaging with normal direction of blood flow towards the liver. Right Kidney: Partially visualized. No mass. No hydronephrosis or nephrolithiasis. Other: Peripancreatic edema. IMPRESSION: 1. Minimal biliary sludge. No cholecystolithiasis or changes of acute cholecystitis. 2. Peripancreatic edema related to pancreatitis again noted. Electronically Signed   By: Rogelia Myers M.D.   On: 02/01/2024 12:16   CT ABDOMEN PELVIS W CONTRAST Result Date: 02/01/2024 CLINICAL DATA:  38 year old female with acute  onset abdominal pain, woke her from sleep, vomiting. History of pancreatitis and pseudocysts. EXAM: CT ABDOMEN AND PELVIS WITH CONTRAST TECHNIQUE: Multidetector CT imaging of the abdomen and pelvis was performed using the standard protocol following bolus administration of intravenous contrast. RADIATION DOSE REDUCTION: This exam was performed according to the departmental dose-optimization program which includes automated exposure control, adjustment of the mA and/or kV according to patient size and/or use of iterative reconstruction technique. CONTRAST:  100mL OMNIPAQUE  IOHEXOL  300 MG/ML  SOLN COMPARISON:  CT Abdomen and Pelvis 12/09/2023 and earlier. FINDINGS: Lower chest: Negative; minor lung base atelectasis or scarring. No pericardial or pleural effusion. Hepatobiliary: Stable liver and gallbladder. No bile duct dilatation. Pancreas: Chronically abnormal. Pancreatic body bilobed pseudocysts redemonstrated on series 2, images 32 and 34, stable since last month and up to 2.5 cm diameter individually. Adjacent dystrophic calcification of the pancreatic body. Partial pancreatic atrophy. Peripancreatic edema/inflammation, which does appear mildly increased from last month at the pancreatic head and uncinate (coronal images 45 and 48). And secondary inflammation of the duodenum C-loop appears increased, no increased edema adjacent to the duodenum on series  2, image 43. Both the duodenum and the stomach are largely decompressed and there is chronic mass effect on the gastrosplenic ligament from a large left subdiaphragmatic pseudocyst which is 103 x 69 by 91 mm (AP by transverse by CC) with simple fluid density and an estimated volume of 320 mL. This pseudocyst size is stable. Spleen: Stable mass effect from adjacent pseudocyst. Splenic enhancement is stable and within normal limits. Adrenals/Urinary Tract: Adrenal glands and kidneys appear stable and negative. Trace gas again noted within the urinary bladder fundus  on series 2, image 82, nonspecific. Stomach/Bowel: Nondilated large and small bowel loops with no active inflammation identified distal to the duodenum (described above). Normal appendix series 2, image 74. No pneumoperitoneum.  No free fluid. 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 Major arterial structures are patent. Mild abdominal aortic atherosclerosis. No lymphadenopathy. Reproductive: Negative. Other: No pelvis free fluid. Musculoskeletal: No acute osseous abnormality identified. IMPRESSION: 1. Unresolved Pancreatitis since last month, with mild progression of inflammation at the pancreatic head, uncinate, and secondarily involving the duodenum C-loop since 12/09/2023. 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 2. Trace gas within the urinary bladder fundus, consider gas-forming UTI if not explained by recent catheterization. 3. Mild but age advanced  Aortic Atherosclerosis (ICD10-I70.0). Electronically Signed   By: VEAR Hurst M.D.   On: 02/01/2024 05:43        Scheduled Meds:  enoxaparin  (LOVENOX ) injection  40 mg Subcutaneous Q24H   insulin  aspart  0-15 Units Subcutaneous TID WC   insulin  aspart  0-5 Units Subcutaneous QHS   insulin  glargine-yfgn  10 Units Subcutaneous QHS   lisinopril  10 mg Oral Daily   pantoprazole  (PROTONIX ) IV  40 mg Intravenous Q24H   Continuous Infusions:  [START ON 02/02/2024] cefTRIAXone  (ROCEPHIN )  IV     lactated ringers        LOS: 0 days    Time spent: 55 minutes    Jakeob Tullis JONETTA Fairly, DO [facility]  If 7PM-7AM, please contact night-coverage [email or web address] 02/01/2024, 1:39 PM

## 2024-02-01 NOTE — ED Provider Notes (Signed)
 [place] EMERGENCY DEPARTMENT AT [facility] Provider Note   CSN: [id number] Arrival date & time: 02/01/24  0151     Patient presents with: Abdominal Pain   Jillian Shepherd is a 38 y.o. female.   Patient is a 38 year old female with history of alcohol induced pancreatitis.  Patient presenting with complaints of upper abdominal pain that started this evening after returning home from a movie.  No fevers or chills.  No diarrhea.  She has felt nauseated and has vomited.  No ill contacts and denies having consumed any undercooked or suspicious foods.  She does report consuming alcohol over the past month due to having a rough month.       Prior to Admission medications   Medication Sig Start Date End Date Taking? Authorizing Provider  amitriptyline (ELAVIL) 50 MG tablet Take 50 mg by mouth at bedtime as needed for sleep. 04/01/23   [provider]  amoxicillin  (AMOXIL ) 875 MG tablet Take 1 tablet (875 mg total) by mouth 2 (two) times daily. 10/02/23   Stuart Vernell Norris, PA-C  Blood Glucose Monitoring Suppl DEVI 1 each by Does not apply route in the morning, at noon, and at bedtime. May substitute to any manufacturer covered by patient's insurance. 08/15/23   Duanne Butler DASEN, MD  Continuous Glucose Sensor (FREESTYLE LIBRE 3 PLUS SENSOR) MISC Change sensor every 15 days. 01/16/24   Duanne Butler DASEN, MD  insulin  glargine (LANTUS  SOLOSTAR) 100 UNIT/ML Solostar Pen Inject 20 Units into the skin daily. 08/15/23   Duanne Butler DASEN, MD  Insulin  Pen Needle (SURE-FINE PEN NEEDLES) 31G X 8 MM MISC Use to administer insulin  daily. 08/29/23   Duanne Butler DASEN, MD  lidocaine  (XYLOCAINE ) 2 % solution Use as directed 10 mLs in the mouth or throat every 3 (three) hours as needed. 10/02/23   Stuart Vernell Norris, PA-C  metFORMIN  (GLUCOPHAGE -XR) 500 MG 24 hr tablet Take 4 tablets (2,000 mg total) by mouth daily with breakfast. 09/26/23   Duanne Butler DASEN, MD  ondansetron  (ZOFRAN -ODT)  4 MG disintegrating tablet Take 1 tablet (4 mg total) by mouth every 8 (eight) hours as needed. 12/09/23   Haviland, Julie, MD    Allergies: Patient has no known allergies.    Review of Systems  All other systems reviewed and are negative.   Updated Vital Signs BP (!) 153/107 (BP Location: Right Arm)   Pulse 82   Temp 98 F (36.7 C) (Oral)   Resp 15   Ht 5' 3 (1.6 m)   Wt 75.3 kg   LMP 12/29/2023 (Approximate)   SpO2 97%   BMI 29.41 kg/m   Physical Exam Vitals and nursing note reviewed.  Constitutional:      General: She is not in acute distress.    Appearance: She is well-developed. She is not diaphoretic.  HENT:     Head: Normocephalic and atraumatic.   Cardiovascular:     Rate and Rhythm: Normal rate and regular rhythm.     Heart sounds: No murmur heard.    No friction rub. No gallop.  Pulmonary:     Effort: Pulmonary effort is normal. No respiratory distress.     Breath sounds: Normal breath sounds. No wheezing.  Abdominal:     General: Bowel sounds are normal. There is no distension.     Palpations: Abdomen is soft.     Tenderness: There is abdominal tenderness in the right upper quadrant, epigastric area and left upper quadrant. There is no right CVA  tenderness, left CVA tenderness, guarding or rebound.   Musculoskeletal:        General: Normal range of motion.     Cervical back: Normal range of motion and neck supple.   Skin:    General: Skin is warm and dry.   Neurological:     General: No focal deficit present.     Mental Status: She is alert and oriented to person, place, and time.     (all labs ordered are listed, but only abnormal results are displayed) Labs Reviewed  LIPASE, BLOOD  COMPREHENSIVE METABOLIC PANEL WITH GFR  CBC  URINALYSIS, ROUTINE W REFLEX MICROSCOPIC  ETHANOL  PREGNANCY, URINE  POC URINE PREG, ED    EKG: EKG Interpretation Date/Time:  Saturday February 01 2024 02:12:21 EDT Ventricular Rate:  79 PR Interval:  141 QRS  Duration:  112 QT Interval:  419 QTC Calculation: 481 R Axis:   4  Text Interpretation: Sinus rhythm Borderline intraventricular conduction delay Abnormal R-wave progression, late transition Abnormal inferior Q waves No significant change since 12/09/2023 Confirmed by Geroldine Berg ([id number]) on 02/01/2024 2:15:31 AM  Radiology: No results found.   Procedures   Medications Ordered in the ED  sodium chloride  0.9 % bolus 1,000 mL (has no administration in time range)  ondansetron  (ZOFRAN ) injection 4 mg (has no administration in time range)  HYDROmorphone  (DILAUDID ) injection 1 mg (has no administration in time range)                                    Medical Decision Making Amount and/or Complexity of Data Reviewed Labs: ordered. Radiology: ordered.  Risk Prescription drug management. Decision regarding hospitalization.   Patient presenting here with abdominal pain.  She has history of pancreatitis and this feels the same.  Patient arrives with stable vital signs and is afebrile.  She is uncomfortable appearing and does have tenderness in the epigastric region.  Laboratory studies obtained including CBC, CMP, and lipase.  Lipase is 1200, but laboratory studies otherwise unremarkable.  CT scan of the abdomen and pelvis obtained showing unresolved pancreatitis with pancreatic pseudocyst.  Patient has received multiple doses of Dilaudid  and a dose of Zofran , but continues with discomfort and feeling generally unwell.  I feel is that she will require admission and have spoken with the hospitalist regarding this possibility.  Patient to be admitted to the hospitalist service for further care.     Final diagnoses:  None    ED Discharge Orders     None          Geroldine Berg, MD 02/01/24 [phone]

## 2024-02-02 DIAGNOSIS — K861 Other chronic pancreatitis: Secondary | ICD-10-CM | POA: Diagnosis not present

## 2024-02-02 DIAGNOSIS — K859 Acute pancreatitis without necrosis or infection, unspecified: Secondary | ICD-10-CM | POA: Diagnosis not present

## 2024-02-02 LAB — GLUCOSE, CAPILLARY
Glucose-Capillary: 153 mg/dL — ABNORMAL HIGH (ref 70–99)
Glucose-Capillary: 107 mg/dL — ABNORMAL HIGH (ref 70–99)
Glucose-Capillary: 134 mg/dL — ABNORMAL HIGH (ref 70–99)
Glucose-Capillary: 107 mg/dL — ABNORMAL HIGH (ref 70–99)

## 2024-02-02 LAB — COMPREHENSIVE METABOLIC PANEL WITH GFR
ALT: 11 U/L (ref 0–44)
AST: 13 U/L — ABNORMAL LOW (ref 15–41)
Albumin: 3 g/dL — ABNORMAL LOW (ref 3.5–5.0)
Alkaline Phosphatase: 88 U/L (ref 38–126)
Anion gap: 11 (ref 5–15)
BUN: <5 mg/dL — ABNORMAL LOW (ref 6–20)
CO2: 21 mmol/L — ABNORMAL LOW (ref 22–32)
Calcium: 8.3 mg/dL — ABNORMAL LOW (ref 8.9–10.3)
Chloride: 98 mmol/L (ref 98–111)
Creatinine, Ser: 0.34 mg/dL — ABNORMAL LOW (ref 0.44–1.00)
GFR, Estimated: >60 mL/min (ref >60)
Glucose, Bld: 154 mg/dL — ABNORMAL HIGH (ref 70–99)
Potassium: 2.8 mmol/L — ABNORMAL LOW (ref 3.5–5.1)
Sodium: 130 mmol/L — ABNORMAL LOW (ref 135–145)
Total Bilirubin: 1.2 mg/dL (ref 0.0–1.2)
Total Protein: 6.4 g/dL — ABNORMAL LOW (ref 6.5–8.1)

## 2024-02-02 LAB — CBC
HCT: 43.8 % (ref 36.0–46.0)
Hemoglobin: 14.3 g/dL (ref 12.0–15.0)
MCH: 30.5 pg (ref 26.0–34.0)
MCHC: 32.6 g/dL (ref 30.0–36.0)
MCV: 93.4 fL (ref 80.0–100.0)
Platelets: 185 10*3/uL (ref 150–400)
RBC: 4.69 MIL/uL (ref 3.87–5.11)
RDW: 14.3 % (ref 11.5–15.5)
WBC: 7.8 10*3/uL (ref 4.0–10.5)
nRBC: 0 % (ref 0.0–0.2)

## 2024-02-02 LAB — MAGNESIUM: Magnesium: 1.5 mg/dL — ABNORMAL LOW (ref 1.7–2.4)

## 2024-02-02 LAB — LIPASE, BLOOD: Lipase: 174 U/L — ABNORMAL HIGH (ref 11–51)

## 2024-02-02 MED ORDER — MAGNESIUM SULFATE 2 GM/50ML IV SOLN
2.0000 g | Freq: Once | INTRAVENOUS | Status: AC
  Administered 2024-02-02: 2 g via INTRAVENOUS
  Filled 2024-02-02: qty 50

## 2024-02-02 MED ORDER — POTASSIUM CHLORIDE IN NACL 40-0.9 MEQ/L-% IV SOLN: INTRAVENOUS | Status: AC

## 2024-02-02 MED ORDER — LISINOPRIL 10 MG PO TABS
40.0000 mg | ORAL_TABLET | Freq: Every day | ORAL | Status: DC
  Administered 2024-02-02 – 2024-02-04 (×3): 40 mg via ORAL
  Filled 2024-02-02 (×2): qty 4

## 2024-02-02 NOTE — Progress Notes (Signed)
 PROGRESS NOTE    Jillian Shepherd  FMW:[id number] DOB: 03/21/1986 DOA: 02/01/2024 PCP: Duanne Butler DASEN, MD   Brief Narrative:    Jillian Shepherd is a 38 y.o. female with medical history significant of  Liddle syndrome, alcoholic pancreatitis/chronic pancreatitis with pseudocyst formation with recurrent admissions for same who presents to ED emergency department due to upper abdominal pain which started in the evening yesterday after returning home from a movie.  She has been drinking significant amounts of alcohol recently and was admitted with acute on chronic pancreatitis.  She is also noted to have UTI and has been started on Rocephin  empirically with cultures pending.  Assessment & Plan:   Principal Problem:   Acute on chronic pancreatitis Active Problems:   Alcohol abuse   Nausea & vomiting   Essential hypertension   Abdominal pain   Hypokalemia   Type 2 diabetes mellitus with hyperglycemia (HCC)   UTI (urinary tract infection)  Assessment and Plan:   Acute on chronic pancreatitis Abdominal pain, nausea and vomiting due to above Continue IV Compazine  p.r.n Continue IV Dilaudid  p.r.n for pain Continue Protonix  Changed to normal saline Placed on n.p.o. for now RUQ U/S with no concerning findings  Mild hyponatremia Continue IV normal saline   Hypokalemia/hypomagnesemia K+ 2.8, to be replenished IV   T2DM with hyperglycemia Hemoglobin A1c on 01/16/2023 was 12.5 Continue Semglee 10 units nightly and adjust dose accordingly Continue ISS and hypoglycemia protocol   Presumed UTI POA Patient was started on IV ceftriaxone , we shall continue same at this time Urine culture pending   Essential hypertension-uncontrolled Continue lisinopril Add hydralazine  as needed   Alcohol abuse Patient was counseled on alcohol abuse cessation    DVT prophylaxis:Lovenox  Code Status: Full Family Communication: None at bedside Disposition Plan:  Status is: Inpatient Remains  inpatient appropriate because: Need for IV fluids and medications   Consultants:  None  Procedures:  None  Antimicrobials:  None   Subjective: Patient seen and evaluated today with ongoing significant amounts of pain along with nausea, denies any vomiting.  Does not feel like eating.  Objective: Vitals:   02/01/24 0647 02/01/24 1427 02/01/24 1933 02/02/24 0415  BP: (!) 141/112 (!) 148/102 (!) 164/107 (!) 163/107  Pulse: 80 74 79 96  Resp:  15 18 17   Temp: 97.6 F (36.4 C) 98.1 F (36.7 C) (!) 97.5 F (36.4 C) 98 F (36.7 C)  TempSrc: Oral Oral Oral Oral  SpO2: 96% 92% 92% 94%  Weight:      Height:        Intake/Output Summary (Last 24 hours) at 02/02/2024 1026 Last data filed at 02/02/2024 0300 Gross per 24 hour  Intake 240 ml  Output --  Net 240 ml   Filed Weights   02/01/24 0208 02/01/24 0645  Weight: 75.3 kg 75.7 kg    Examination:  General exam: Appears calm and comfortable  Respiratory system: Clear to auscultation. Respiratory effort normal. Cardiovascular system: S1 & S2 heard, RRR.  Gastrointestinal system: Abdomen is soft Central nervous system: Alert and awake Extremities: No edema Skin: No significant lesions noted Psychiatry: Flat affect.    Data Reviewed: I have personally reviewed following labs and imaging studies  CBC: Recent Labs  Lab 02/01/24 0228 02/01/24 0750 02/02/24 0404  WBC 7.2 6.6 7.8  HGB 16.3* 14.7 14.3  HCT 47.5* 43.3 43.8  MCV 91.5 92.3 93.4  PLT 217 195 185   Basic Metabolic Panel: Recent Labs  Lab 02/01/24 0228 02/01/24 0750 02/02/24 0404  NA 135  --  130*  K 2.8*  --  2.8*  CL 97*  --  98  CO2 24  --  21*  GLUCOSE 274*  --  154*  BUN 11  --  <5*  CREATININE 0.52 0.40* 0.34*  CALCIUM 8.9  --  8.3*  MG  --  1.7 1.5*  PHOS  --  3.0  --    GFR: Estimated Creatinine Clearance: 92 mL/min (A) (by C-G formula based on SCr of 0.34 mg/dL (L)). Liver Function Tests: Recent Labs  Lab 02/01/24 0228  02/02/24 0404  AST 19 13*  ALT 17 11  ALKPHOS 119 88  BILITOT 0.7 1.2  PROT 7.5 6.4*  ALBUMIN 3.7 3.0*   Recent Labs  Lab 02/01/24 0228 02/02/24 0404  LIPASE 1,253* 174*   No results for input(s): AMMONIA  in the last 168 hours. Coagulation Profile: No results for input(s): INR, PROTIME in the last 168 hours. Cardiac Enzymes: No results for input(s): CKTOTAL, CKMB, CKMBINDEX, TROPONINI in the last 168 hours. BNP (last 3 results) No results for input(s): PROBNP in the last 8760 hours. HbA1C: No results for input(s): HGBA1C in the last 72 hours. CBG: Recent Labs  Lab 02/01/24 0827 02/01/24 1147 02/01/24 1629 02/01/24 1943 02/02/24 0721  GLUCAP 239* 190* 169* 182* 153*   Lipid Profile: Recent Labs    02/01/24 0750  CHOL 166  HDL 39*  LDLCALC 99  TRIG 859  CHOLHDL 4.3   Thyroid  Function Tests: No results for input(s): TSH, T4TOTAL, FREET4, T3FREE, THYROIDAB in the last 72 hours. Anemia Panel: No results for input(s): VITAMINB12, FOLATE, FERRITIN, TIBC, IRON, RETICCTPCT in the last 72 hours. Sepsis Labs: No results for input(s): PROCALCITON, LATICACIDVEN in the last 168 hours.  No results found for this or any previous visit (from the past 240 hours).       Radiology Studies: US  Abdomen Limited Result Date: 02/01/2024 CLINICAL DATA:  [id number] Acute on chronic pancreatitis (HCC) [id number] EXAM: ULTRASOUND ABDOMEN LIMITED RIGHT UPPER QUADRANT COMPARISON:  None Available. FINDINGS: Gallbladder: Minimal biliary sludge. No gallstones. No wall thickening or pericholecystic fluid. No sonographic Murphy's sign noted by sonographer. Common bile duct: Diameter: 2 mm Liver: Normal echogenicity. No focal lesion identified. No intrahepatic biliary ductal dilation. Portal vein is patent on color Doppler imaging with normal direction of blood flow towards the liver. Right Kidney: Partially visualized. No mass. No hydronephrosis or  nephrolithiasis. Other: Peripancreatic edema. IMPRESSION: 1. Minimal biliary sludge. No cholecystolithiasis or changes of acute cholecystitis. 2. Peripancreatic edema related to pancreatitis again noted. Electronically Signed   By: Rogelia Myers M.D.   On: 02/01/2024 12:16   CT ABDOMEN PELVIS W CONTRAST Result Date: 02/01/2024 CLINICAL DATA:  38 year old female with acute onset abdominal pain, woke her from sleep, vomiting. History of pancreatitis and pseudocysts. EXAM: CT ABDOMEN AND PELVIS WITH CONTRAST TECHNIQUE: Multidetector CT imaging of the abdomen and pelvis was performed using the standard protocol following bolus administration of intravenous contrast. RADIATION DOSE REDUCTION: This exam was performed according to the departmental dose-optimization program which includes automated exposure control, adjustment of the mA and/or kV according to patient size and/or use of iterative reconstruction technique. CONTRAST:  OMNIPAQUE  IOHEXOL  300 MG/ML  SOLN COMPARISON:  CT Abdomen and Pelvis 12/09/2023 and earlier. FINDINGS: Lower chest: Negative; minor lung base atelectasis or scarring. No pericardial or pleural effusion. Hepatobiliary: Stable liver and gallbladder. No bile duct dilatation. Pancreas: Chronically abnormal. Pancreatic body bilobed pseudocysts redemonstrated on series 2, images 32  and 34, stable since last month and up to 2.5 cm diameter individually. Adjacent dystrophic calcification of the pancreatic body. Partial pancreatic atrophy. Peripancreatic edema/inflammation, which does appear mildly increased from last month at the pancreatic head and uncinate (coronal images 45 and 48). And secondary inflammation of the duodenum C-loop appears increased, no increased edema adjacent to the duodenum on series 2, image 43. Both the duodenum and the stomach are largely decompressed and there is chronic mass effect on the gastrosplenic ligament from a large left subdiaphragmatic pseudocyst which is  103 x 69 by 91 mm (AP by transverse by CC) with simple fluid density and an estimated volume of 320 mL. This pseudocyst size is stable. Spleen: Stable mass effect from adjacent pseudocyst. Splenic enhancement is stable and within normal limits. Adrenals/Urinary Tract: Adrenal glands and kidneys appear stable and negative. Trace gas again noted within the urinary bladder fundus on series 2, image 82, nonspecific. Stomach/Bowel: Nondilated large and small bowel loops with no active inflammation identified distal to the duodenum (described above). Normal appendix series 2, image 74. No pneumoperitoneum.  No free fluid. 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 Major arterial structures are patent. Mild abdominal aortic atherosclerosis. No lymphadenopathy. Reproductive: Negative. Other: No pelvis free fluid. Musculoskeletal: No acute osseous abnormality identified. IMPRESSION: 1. Unresolved Pancreatitis since last month, with mild progression of inflammation at the pancreatic head, uncinate, and secondarily involving the duodenum C-loop since 12/09/2023. 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 2. Trace gas within the urinary bladder fundus, consider gas-forming UTI if not explained by recent catheterization. 3. Mild but age advanced  Aortic Atherosclerosis (ICD10-I70.0). Electronically Signed   By: VEAR Hurst M.D.   On: 02/01/2024 05:43        Scheduled Meds:  enoxaparin  (LOVENOX ) injection  40 mg Subcutaneous Q24H   insulin  aspart  0-15 Units Subcutaneous TID WC   insulin  aspart  0-5 Units Subcutaneous QHS   insulin  glargine-yfgn  10 Units Subcutaneous QHS   lisinopril  40 mg Oral Daily   pantoprazole   (PROTONIX ) IV  40 mg Intravenous Q24H   Continuous Infusions:  0.9 % NaCl with KCl 40 mEq / L 100 mL/hr at 02/02/24 0800   cefTRIAXone  (ROCEPHIN )  IV 1 g (02/02/24 0536)     LOS: 1 day    Time spent: 55 minutes    Analina Filla D Maree, DO [facility]  If 7PM-7AM, please contact night-coverage [email or web address] 02/02/2024, 10:26 AM

## 2024-02-02 NOTE — Plan of Care (Signed)
  Problem: Health Behavior/Discharge Planning: Goal: Ability to manage health-related needs will improve Outcome: Progressing   Problem: Clinical Measurements: Goal: Will remain free from infection Outcome: Progressing   Problem: Activity: Goal: Risk for activity intolerance will decrease Outcome: Progressing   Problem: Coping: Goal: Level of anxiety will decrease Outcome: Progressing   Problem: Pain Managment: Goal: General experience of comfort will improve and/or be controlled Outcome: Progressing

## 2024-02-02 NOTE — Plan of Care (Signed)
   Problem: Education: Goal: Knowledge of General Education information will improve Description: Including pain rating scale, medication(s)/side effects and non-pharmacologic comfort measures Outcome: Progressing   Problem: Clinical Measurements: Goal: Ability to maintain clinical measurements within normal limits will improve Outcome: Progressing Goal: Diagnostic test results will improve Outcome: Progressing

## 2024-02-02 NOTE — Plan of Care (Signed)
   Problem: Health Behavior/Discharge Planning: Goal: Ability to manage health-related needs will improve Outcome: Progressing

## 2024-02-03 DIAGNOSIS — K859 Acute pancreatitis without necrosis or infection, unspecified: Secondary | ICD-10-CM | POA: Diagnosis not present

## 2024-02-03 DIAGNOSIS — K861 Other chronic pancreatitis: Secondary | ICD-10-CM | POA: Diagnosis not present

## 2024-02-03 LAB — CBC
HCT: 44.1 % (ref 36.0–46.0)
Hemoglobin: 14.3 g/dL (ref 12.0–15.0)
MCH: 30.9 pg (ref 26.0–34.0)
MCHC: 32.4 g/dL (ref 30.0–36.0)
MCV: 95.2 fL (ref 80.0–100.0)
Platelets: 181 10*3/uL (ref 150–400)
RBC: 4.63 MIL/uL (ref 3.87–5.11)
RDW: 14.7 % (ref 11.5–15.5)
WBC: 6.5 10*3/uL (ref 4.0–10.5)
nRBC: 0 % (ref 0.0–0.2)

## 2024-02-03 LAB — GLUCOSE, CAPILLARY
Glucose-Capillary: 118 mg/dL — ABNORMAL HIGH (ref 70–99)
Glucose-Capillary: 140 mg/dL — ABNORMAL HIGH (ref 70–99)
Glucose-Capillary: 200 mg/dL — ABNORMAL HIGH (ref 70–99)

## 2024-02-03 LAB — URINE CULTURE: Culture: NO GROWTH

## 2024-02-03 LAB — COMPREHENSIVE METABOLIC PANEL WITH GFR
ALT: 10 U/L (ref 0–44)
AST: 14 U/L — ABNORMAL LOW (ref 15–41)
Albumin: 2.9 g/dL — ABNORMAL LOW (ref 3.5–5.0)
Alkaline Phosphatase: 81 U/L (ref 38–126)
Anion gap: 14 (ref 5–15)
BUN: <5 mg/dL — ABNORMAL LOW (ref 6–20)
CO2: 19 mmol/L — ABNORMAL LOW (ref 22–32)
Calcium: 8.4 mg/dL — ABNORMAL LOW (ref 8.9–10.3)
Chloride: 103 mmol/L (ref 98–111)
Creatinine, Ser: 0.35 mg/dL — ABNORMAL LOW (ref 0.44–1.00)
GFR, Estimated: >60 mL/min (ref >60)
Glucose, Bld: 108 mg/dL — ABNORMAL HIGH (ref 70–99)
Potassium: 3.6 mmol/L (ref 3.5–5.1)
Sodium: 136 mmol/L (ref 135–145)
Total Bilirubin: 1.1 mg/dL (ref 0.0–1.2)
Total Protein: 6.3 g/dL — ABNORMAL LOW (ref 6.5–8.1)

## 2024-02-03 LAB — LIPASE, BLOOD: Lipase: 55 U/L — ABNORMAL HIGH (ref 11–51)

## 2024-02-03 LAB — MAGNESIUM: Magnesium: 1.7 mg/dL (ref 1.7–2.4)

## 2024-02-03 MED ORDER — SODIUM CHLORIDE 0.9 % IV SOLN: INTRAVENOUS | Status: AC

## 2024-02-03 NOTE — Progress Notes (Signed)
 Patient's CBG not transferring at this time. Current CBG is 117.

## 2024-02-03 NOTE — Progress Notes (Signed)
 PROGRESS NOTE    [name]  FMW:[id number] DOB: 1986/06/24 DOA: 02/01/2024 PCP: Duanne Butler DASEN, MD   Brief Narrative:    Jillian Shepherd is a 38 y.o. female with medical history significant of  Liddle syndrome, alcoholic pancreatitis/chronic pancreatitis with pseudocyst formation with recurrent admissions for same who presents to ED emergency department due to upper abdominal pain which started in the evening yesterday after returning home from a movie.  She has been drinking significant amounts of alcohol recently and was admitted with acute on chronic pancreatitis.  She is also noted to have UTI with urine cultures currently pending, however she has completed 3-day course of Rocephin  and therefore this has been discontinued.  Diet is now being advanced to clear liquid today, hopefully can advance further by tomorrow and could consider discharge.  Assessment & Plan:   Principal Problem:   Acute on chronic pancreatitis Active Problems:   Alcohol abuse   Nausea & vomiting   Essential hypertension   Abdominal pain   Hypokalemia   Type 2 diabetes mellitus with hyperglycemia (HCC)   UTI (urinary tract infection)  Assessment and Plan:   Acute on chronic pancreatitis-slowly improving Symptomatically improved, advance to clear liquid diet today Continue IV Compazine  p.r.n Continue IV Dilaudid  p.r.n for pain Continue Protonix  Changed to normal saline for 12 hours RUQ U/S with no concerning findings  Mild hyponatremia-resolved Continue to monitor in a.m. Continue normal saline   Hypokalemia/hypomagnesemia Repleted, continue to monitor   T2DM with hyperglycemia Hemoglobin A1c on 01/16/2023 was 12.5 Continue Semglee 10 units nightly and adjust dose accordingly Continue ISS and hypoglycemia protocol   Presumed UTI POA Patient was started on IV ceftriaxone  and has completed 3-day course Urine culture pending   Essential hypertension-improved control Continue  lisinopril Add hydralazine  as needed   Alcohol abuse Patient was counseled on alcohol abuse cessation    DVT prophylaxis:Lovenox  Code Status: Full Family Communication: None at bedside Disposition Plan:  Status is: Inpatient Remains inpatient appropriate because: Need for IV fluids and medications   Consultants:  None  Procedures:  None  Antimicrobials:  Anti-infectives (From admission, onward)    Start     Dose/Rate Route Frequency Ordered Stop   02/02/24 0600  cefTRIAXone  (ROCEPHIN ) 1 g in sodium chloride  0.9 % 100 mL IVPB  Status:  Discontinued        1 g 200 mL/hr over 30 Minutes Intravenous Every 24 hours 02/01/24 0720 02/03/24 0944   02/01/24 0600  cefTRIAXone  (ROCEPHIN ) 1 g in sodium chloride  0.9 % 100 mL IVPB        1 g 200 mL/hr over 30 Minutes Intravenous  Once 02/01/24 0546 02/01/24 9367      Subjective: Patient seen and evaluated today with improved abdominal pain and less nausea and vomiting noted.  Willing to advance to clear liquid diet today.  Objective: Vitals:   02/02/24 0415 02/02/24 1219 02/02/24 2051 02/03/24 0435  BP: (!) 163/107 (!) 160/99 (!) 128/96 (!) 146/98  Pulse: 96 90 86 76  Resp: 17 18 20 20   Temp: 98 F (36.7 C) 97.9 F (36.6 C) 98.8 F (37.1 C) 98.7 F (37.1 C)  TempSrc: Oral Oral Oral Oral  SpO2: 94% 98% 96% 96%  Weight:      Height:       No intake or output data in the 24 hours ending 02/03/24 0944  Filed Weights   02/01/24 0208 02/01/24 0645  Weight: 75.3 kg 75.7 kg    Examination:  General  exam: Appears calm and comfortable  Respiratory system: Clear to auscultation. Respiratory effort normal. Cardiovascular system: S1 & S2 heard, RRR.  Gastrointestinal system: Abdomen is soft Central nervous system: Alert and awake Extremities: No edema Skin: No significant lesions noted Psychiatry: Flat affect.    Data Reviewed: I have personally reviewed following labs and imaging studies  CBC: Recent Labs  Lab  02/01/24 0228 02/01/24 0750 02/02/24 0404 02/03/24 0416  WBC 7.2 6.6 7.8 6.5  HGB 16.3* 14.7 14.3 14.3  HCT 47.5* 43.3 43.8 44.1  MCV 91.5 92.3 93.4 95.2  PLT 217 195 185 181   Basic Metabolic Panel: Recent Labs  Lab 02/01/24 0228 02/01/24 0750 02/02/24 0404 02/03/24 0416  NA 135  --  130* 136  K 2.8*  --  2.8* 3.6  CL 97*  --  98 103  CO2 24  --  21* 19*  GLUCOSE 274*  --  154* 108*  BUN 11  --  <5* <5*  CREATININE 0.52 0.40* 0.34* 0.35*  CALCIUM 8.9  --  8.3* 8.4*  MG  --  1.7 1.5* 1.7  PHOS  --  3.0  --   --    GFR: Estimated Creatinine Clearance: 92 mL/min (A) (by C-G formula based on SCr of 0.35 mg/dL (L)). Liver Function Tests: Recent Labs  Lab 02/01/24 0228 02/02/24 0404 02/03/24 0416  AST 19 13* 14*  ALT 17 11 10   ALKPHOS 119 88 81  BILITOT 0.7 1.2 1.1  PROT 7.5 6.4* 6.3*  ALBUMIN 3.7 3.0* 2.9*   Recent Labs  Lab 02/01/24 0228 02/02/24 0404 02/03/24 0416  LIPASE 1,253* 174* 55*   No results for input(s): AMMONIA  in the last 168 hours. Coagulation Profile: No results for input(s): INR, PROTIME in the last 168 hours. Cardiac Enzymes: No results for input(s): CKTOTAL, CKMB, CKMBINDEX, TROPONINI in the last 168 hours. BNP (last 3 results) No results for input(s): PROBNP in the last 8760 hours. HbA1C: No results for input(s): HGBA1C in the last 72 hours. CBG: Recent Labs  Lab 02/01/24 1943 02/02/24 0721 02/02/24 1117 02/02/24 1615 02/02/24 2053  GLUCAP 182* 153* 107* 134* 107*   Lipid Profile: Recent Labs    02/01/24 0750  CHOL 166  HDL 39*  LDLCALC 99  TRIG 859  CHOLHDL 4.3   Thyroid  Function Tests: No results for input(s): TSH, T4TOTAL, FREET4, T3FREE, THYROIDAB in the last 72 hours. Anemia Panel: No results for input(s): VITAMINB12, FOLATE, FERRITIN, TIBC, IRON, RETICCTPCT in the last 72 hours. Sepsis Labs: No results for input(s): PROCALCITON, LATICACIDVEN in the last 168  hours.  No results found for this or any previous visit (from the past 240 hours).       Radiology Studies: US  Abdomen Limited Result Date: 02/01/2024 CLINICAL DATA:  [id number] Acute on chronic pancreatitis (HCC) [id number] EXAM: ULTRASOUND ABDOMEN LIMITED RIGHT UPPER QUADRANT COMPARISON:  None Available. FINDINGS: Gallbladder: Minimal biliary sludge. No gallstones. No wall thickening or pericholecystic fluid. No sonographic Murphy's sign noted by sonographer. Common bile duct: Diameter: 2 mm Liver: Normal echogenicity. No focal lesion identified. No intrahepatic biliary ductal dilation. Portal vein is patent on color Doppler imaging with normal direction of blood flow towards the liver. Right Kidney: Partially visualized. No mass. No hydronephrosis or nephrolithiasis. Other: Peripancreatic edema. IMPRESSION: 1. Minimal biliary sludge. No cholecystolithiasis or changes of acute cholecystitis. 2. Peripancreatic edema related to pancreatitis again noted. Electronically Signed   By: Rogelia Myers M.D.   On: 02/01/2024 12:16  Scheduled Meds:  enoxaparin  (LOVENOX ) injection  40 mg Subcutaneous Q24H   insulin  aspart  0-15 Units Subcutaneous TID WC   insulin  aspart  0-5 Units Subcutaneous QHS   insulin  glargine-yfgn  10 Units Subcutaneous QHS   lisinopril  40 mg Oral Daily   pantoprazole  (PROTONIX ) IV  40 mg Intravenous Q24H   Continuous Infusions:  sodium chloride        LOS: 2 days    Time spent: 55 minutes    Mohamadou Maciver JONETTA Fairly, DO [facility]  If 7PM-7AM, please contact night-coverage [email or web address] 02/03/2024, 9:44 AM

## 2024-02-04 DIAGNOSIS — K219 Gastro-esophageal reflux disease without esophagitis: Secondary | ICD-10-CM

## 2024-02-04 DIAGNOSIS — E1165 Type 2 diabetes mellitus with hyperglycemia: Secondary | ICD-10-CM | POA: Diagnosis not present

## 2024-02-04 DIAGNOSIS — I10 Essential (primary) hypertension: Secondary | ICD-10-CM | POA: Diagnosis not present

## 2024-02-04 DIAGNOSIS — F1010 Alcohol abuse, uncomplicated: Secondary | ICD-10-CM | POA: Diagnosis not present

## 2024-02-04 DIAGNOSIS — K859 Acute pancreatitis without necrosis or infection, unspecified: Secondary | ICD-10-CM | POA: Diagnosis not present

## 2024-02-04 LAB — COMPREHENSIVE METABOLIC PANEL WITH GFR
ALT: 10 U/L (ref 0–44)
AST: 16 U/L (ref 15–41)
Albumin: 3.3 g/dL — ABNORMAL LOW (ref 3.5–5.0)
Alkaline Phosphatase: 81 U/L (ref 38–126)
Anion gap: 10 (ref 5–15)
BUN: <5 mg/dL — ABNORMAL LOW (ref 6–20)
CO2: 19 mmol/L — ABNORMAL LOW (ref 22–32)
Calcium: 8.8 mg/dL — ABNORMAL LOW (ref 8.9–10.3)
Chloride: 104 mmol/L (ref 98–111)
Creatinine, Ser: 0.37 mg/dL — ABNORMAL LOW (ref 0.44–1.00)
GFR, Estimated: >60 mL/min (ref >60)
Glucose, Bld: 137 mg/dL — ABNORMAL HIGH (ref 70–99)
Potassium: 3.3 mmol/L — ABNORMAL LOW (ref 3.5–5.1)
Sodium: 133 mmol/L — ABNORMAL LOW (ref 135–145)
Total Bilirubin: 1.2 mg/dL (ref 0.0–1.2)
Total Protein: 7.1 g/dL (ref 6.5–8.1)

## 2024-02-04 LAB — GLUCOSE, CAPILLARY
Glucose-Capillary: 123 mg/dL — ABNORMAL HIGH (ref 70–99)
Glucose-Capillary: 108 mg/dL — ABNORMAL HIGH (ref 70–99)

## 2024-02-04 LAB — MAGNESIUM: Magnesium: 1.6 mg/dL — ABNORMAL LOW (ref 1.7–2.4)

## 2024-02-04 MED ORDER — ONDANSETRON 8 MG PO TBDP: 8.0000 mg | ORAL_TABLET | Freq: Three times a day (TID) | ORAL | 0 refills | Status: DC | PRN

## 2024-02-04 MED ORDER — PANCRELIPASE (LIP-PROT-AMYL) 36000-114000 UNITS PO CPEP: 72000.0000 [IU] | ORAL_CAPSULE | Freq: Three times a day (TID) | ORAL | 2 refills | Status: DC

## 2024-02-04 MED ORDER — LISINOPRIL 40 MG PO TABS: 40.0000 mg | ORAL_TABLET | Freq: Every day | ORAL | 1 refills | Status: DC

## 2024-02-04 MED ORDER — OXYCODONE HCL 5 MG PO TABS: 5.0000 mg | ORAL_TABLET | Freq: Three times a day (TID) | ORAL | 0 refills | Status: DC | PRN

## 2024-02-04 MED ORDER — PANTOPRAZOLE SODIUM 40 MG PO TBEC: 40.0000 mg | DELAYED_RELEASE_TABLET | Freq: Every day | ORAL | 1 refills | Status: DC

## 2024-02-04 MED ORDER — MAGNESIUM SULFATE 2 GM/50ML IV SOLN
2.0000 g | Freq: Once | INTRAVENOUS | Status: AC
  Administered 2024-02-04: 2 g via INTRAVENOUS
  Filled 2024-02-04: qty 50

## 2024-02-04 MED ORDER — POTASSIUM CHLORIDE CRYS ER 20 MEQ PO TBCR
40.0000 meq | EXTENDED_RELEASE_TABLET | Freq: Once | ORAL | Status: AC
  Administered 2024-02-04: 40 meq via ORAL
  Filled 2024-02-04: qty 2

## 2024-02-04 NOTE — Discharge Summary (Signed)
 Physician Discharge Summary   Patient: Jillian Shepherd MRN: [id number] DOB: 02/19/1986  Admit date:     02/01/2024  Discharge date: 02/04/24  Discharge Physician: Eric Nunnery   PCP: Duanne Butler DASEN, MD   Recommendations at discharge:  Repeat complete metabolic panel to follow ultralights, LFTs and renal function. Reassess blood pressure and adjust medication as needed Continue assisting patient with alcohol cessation Continue to follow CBGs fluctuation and further adjust hypoglycemic regimen as required.  Discharge Diagnoses: Principal Problem:   Acute on chronic pancreatitis Active Problems:   Alcohol abuse   Nausea & vomiting   Essential hypertension   Abdominal pain   Hypokalemia   Type 2 diabetes mellitus with hyperglycemia (HCC)   UTI (urinary tract infection)  Brief Hospital admission narrative: As per H&P written by Dr. Manfred on 02/01/2024 Jillian Shepherd is a 38 y.o. female with medical history significant of  Liddle syndrome, alcoholic pancreatitis/chronic pancreatitis with pseudocyst formation with recurrent admissions for same who presents to ED emergency department due to upper abdominal pain which started in the evening yesterday after returning home from a movie.  Pain was sharp and burning in nature, it was in epigastric area with radiation to right upper abdomen to the back.  It was associated with nausea and vomiting.  Last alcohol drink was 5 days ago.  She endorses consuming alcohol over the past month due to having a rough month .   ED Course:  In the emergency department, BP was 140/25, other vital signs were within normal range.  Workup in the ED showed WBC 7.2, hemoglobin 16.3, hematocrit 47.5, MCV 91.5, platelets 217.  BMP was normal except for potassium of 2.8, chloride 97, blood glucose was 274, lipase 1, 253, urinalysis was positive for nitrite and glycosuria.  Pregnancy test was negative. CT abdomen and pelvis with contrast showed only subtle  pancreatitis since last month, with mild progression of inflammation at the pancreatic head, uncinate and secondarily involving the duodenum C-loop since 12/09/2023. She was treated with IV Dilaudid , Zofran  and IV hydration.  IV ceftriaxone  was given due to presumed UTI.  Assessment and Plan: Acute on chronic pancreatitis-slowly improving -Symptomatically improved and tolerating diet at discharge - As needed antiemetics and analgesics provided at discharge to continue symptomatic management. - Patient advised to stop alcohol consumption and to pursued modified carbohydrates and low fat diet. - Creon  3 times a day recommended.   Mild hyponatremia-resolved -Patient advised to maintain adequate hydration.   Hypokalemia/hypomagnesemia -Electrolytes have been repleted and is stable at discharge - Repeat basic metabolic panel at follow-up visit to assess stability.     T2DM with hyperglycemia -Hemoglobin A1c on 01/16/2023 was 12.5 -Resume home hypoglycemic regimen - Continue close follow-up with PCP to further adjust medications as required. -Patient advised to check blood sugar at least 3 times a day.   Presumed UTI POA -No specific microorganism isolated on urine culture - Patient completed 3 days of IV antibiotics - No dysuria or frequency at discharge - Advised to maintain adequate hydration.   Essential hypertension-improved control -Continue home antihypertensive agents - Heart healthy/low-sodium diet discussed with patient.   Alcohol abuse - Cessation counseling provided - Patient looking to quit drinking.  GERD - Continue PPI.  Consultants: None Procedures performed: See below for x-ray reports. Disposition: Home Diet recommendation: Modified carbohydrates and heart healthy diet.  DISCHARGE MEDICATION: Allergies as of 02/04/2024   No Known Allergies      Medication List     TAKE these medications  acetaminophen  500 MG tablet Commonly known as: TYLENOL  Take  1,000 mg by mouth every 6 (six) hours as needed for mild pain (pain score 1-3).   amitriptyline 50 MG tablet Commonly known as: ELAVIL Take 50 mg by mouth at bedtime as needed for sleep.   Blood Glucose Monitoring Suppl Devi 1 each by Does not apply route in the morning, at noon, and at bedtime. May substitute to any manufacturer covered by patient's insurance.   FreeStyle Libre 3 Plus Sensor Misc Change sensor every 15 days.   Lantus  SoloStar 100 UNIT/ML Solostar Pen Generic drug: insulin  glargine Inject 20 Units into the skin daily.   lipase/protease/amylase 63999 UNITS Cpep capsule Commonly known as: Creon  Take 2 capsules (72,000 Units total) by mouth 3 (three) times daily with meals.   lisinopril 40 MG tablet Commonly known as: ZESTRIL Take 1 tablet (40 mg total) by mouth daily. Start taking on: February 05, 2024   ondansetron  8 MG disintegrating tablet Commonly known as: ZOFRAN -ODT Take 1 tablet (8 mg total) by mouth every 8 (eight) hours as needed. What changed:  medication strength how much to take   oxyCODONE  5 MG immediate release tablet Commonly known as: Roxicodone  Take 1 tablet (5 mg total) by mouth every 8 (eight) hours as needed for severe pain (pain score 7-10).   pantoprazole  40 MG tablet Commonly known as: Protonix  Take 1 tablet (40 mg total) by mouth daily.   Sure-Fine Pen Needles 31G X 8 MM Misc Generic drug: Insulin  Pen Needle Use to administer insulin  daily.        Follow-up Information     Duanne Butler DASEN, MD. Schedule an appointment as soon as possible for a visit in 10 day(s).   Specialty: Family Medicine Contact information: [address] [place] [address] [phone]                Discharge Exam: Fredricka Weights   02/01/24 0208 02/01/24 0645  Weight: 75.3 kg 75.7 kg    General exam: Appears calm and comfortable.  Feeling ready to go home. Respiratory system: Clear to auscultation. Respiratory effort normal.   Good saturation on room air. Cardiovascular system: S1 & S2 heard, RRR.  Gastrointestinal system: Abdomen is soft; no guarding and positive bowel sounds on exam. Central nervous system: Alert and awake and following commands appropriately. Extremities: No edema Skin: No significant lesions noted Psychiatry: Flat affect.    Condition at discharge: Stable and improved.  The results of significant diagnostics from this hospitalization (including imaging, microbiology, ancillary and laboratory) are listed below for reference.   Imaging Studies: US  Abdomen Limited Result Date: 02/01/2024 CLINICAL DATA:  [id number] Acute on chronic pancreatitis (HCC) [id number] EXAM: ULTRASOUND ABDOMEN LIMITED RIGHT UPPER QUADRANT COMPARISON:  None Available. FINDINGS: Gallbladder: Minimal biliary sludge. No gallstones. No wall thickening or pericholecystic fluid. No sonographic Murphy's sign noted by sonographer. Common bile duct: Diameter: 2 mm Liver: Normal echogenicity. No focal lesion identified. No intrahepatic biliary ductal dilation. Portal vein is patent on color Doppler imaging with normal direction of blood flow towards the liver. Right Kidney: Partially visualized. No mass. No hydronephrosis or nephrolithiasis. Other: Peripancreatic edema. IMPRESSION: 1. Minimal biliary sludge. No cholecystolithiasis or changes of acute cholecystitis. 2. Peripancreatic edema related to pancreatitis again noted. Electronically Signed   By: Rogelia Myers M.D.   On: 02/01/2024 12:16   CT ABDOMEN PELVIS W CONTRAST Result Date: 02/01/2024 CLINICAL DATA:  38 year old female with acute onset abdominal pain, woke her from  sleep, vomiting. History of pancreatitis and pseudocysts. EXAM: CT ABDOMEN AND PELVIS WITH CONTRAST TECHNIQUE: Multidetector CT imaging of the abdomen and pelvis was performed using the standard protocol following bolus administration of intravenous contrast. RADIATION DOSE REDUCTION: This exam was performed according  to the departmental dose-optimization program which includes automated exposure control, adjustment of the mA and/or kV according to patient size and/or use of iterative reconstruction technique. CONTRAST:  OMNIPAQUE  IOHEXOL  300 MG/ML  SOLN COMPARISON:  CT Abdomen and Pelvis 12/09/2023 and earlier. FINDINGS: Lower chest: Negative; minor lung base atelectasis or scarring. No pericardial or pleural effusion. Hepatobiliary: Stable liver and gallbladder. No bile duct dilatation. Pancreas: Chronically abnormal. Pancreatic body bilobed pseudocysts redemonstrated on series 2, images 32 and 34, stable since last month and up to 2.5 cm diameter individually. Adjacent dystrophic calcification of the pancreatic body. Partial pancreatic atrophy. Peripancreatic edema/inflammation, which does appear mildly increased from last month at the pancreatic head and uncinate (coronal images 45 and 48). And secondary inflammation of the duodenum C-loop appears increased, no increased edema adjacent to the duodenum on series 2, image 43. Both the duodenum and the stomach are largely decompressed and there is chronic mass effect on the gastrosplenic ligament from a large left subdiaphragmatic pseudocyst which is 103 x 69 by 91 mm (AP by transverse by CC) with simple fluid density and an estimated volume of 320 mL. This pseudocyst size is stable. Spleen: Stable mass effect from adjacent pseudocyst. Splenic enhancement is stable and within normal limits. Adrenals/Urinary Tract: Adrenal glands and kidneys appear stable and negative. Trace gas again noted within the urinary bladder fundus on series 2, image 82, nonspecific. Stomach/Bowel: Nondilated large and small bowel loops with no active inflammation identified distal to the duodenum (described above). Normal appendix series 2, image 74. No pneumoperitoneum.  No free fluid. Vascular/Lymphatic: Portal venous system remains patent, although with some chronic cavernous transformation of  the main portal vein redemonstrated at the porta hepatis (series 2, image 30). Major arterial structures are patent. Mild abdominal aortic atherosclerosis. No lymphadenopathy. Reproductive: Negative. Other: No pelvis free fluid. Musculoskeletal: No acute osseous abnormality identified. IMPRESSION: 1. Unresolved Pancreatitis since last month, with mild progression of inflammation at the pancreatic head, uncinate, and secondarily involving the duodenum C-loop since 12/09/2023. 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 2. Trace gas within the urinary bladder fundus, consider gas-forming UTI if not explained by recent catheterization. 3. Mild but age advanced  Aortic Atherosclerosis (ICD10-I70.0). Electronically Signed   By: VEAR Hurst M.D.   On: 02/01/2024 05:43   DG Chest 2 View Result Date: 01/19/2024 CLINICAL DATA:  Left-sided chest pain for 5 days, fell last weekend EXAM: CHEST - 2 VIEW COMPARISON:  12/09/2023 FINDINGS: Frontal and lateral views of the chest demonstrate an unremarkable cardiac silhouette. No airspace disease, effusion, or pneumothorax. No acute displaced fracture. IMPRESSION: 1. No acute intrathoracic process. Electronically Signed   By: Ozell Daring M.D.   On: 01/19/2024 21:08    Microbiology: Results for orders placed or performed during the hospital encounter of 02/01/24  Urine Culture     Status: None   Collection Time: 02/01/24 12:20 PM   Specimen: Urine, Clean Catch  Result Value Ref Range Status   Specimen Description   Final    URINE, CLEAN CATCH Performed at [facility], [address] [place], [place] [address]    Special  Requests   Final    NONE Performed at [facility], [address] [place], [place] [address]    Culture   Final    NO GROWTH Performed at [facility], [address],  [place], [place] [address]    Report Status 02/03/2024 FINAL  Final    Labs: CBC: Recent Labs  Lab 02/01/24 0228 02/01/24 0750 02/02/24 0404 02/03/24 0416  WBC 7.2 6.6 7.8 6.5  HGB 16.3* 14.7 14.3 14.3  HCT 47.5* 43.3 43.8 44.1  MCV 91.5 92.3 93.4 95.2  PLT 217 195 185 181   Basic Metabolic Panel: Recent Labs  Lab 02/01/24 0228 02/01/24 0750 02/02/24 0404 02/03/24 0416 02/04/24 0411  NA 135  --  130* 136 133*  K 2.8*  --  2.8* 3.6 3.3*  CL 97*  --  98 103 104  CO2 24  --  21* 19* 19*  GLUCOSE 274*  --  154* 108* 137*  BUN 11  --  <5* <5* <5*  CREATININE 0.52 0.40* 0.34* 0.35* 0.37*  CALCIUM 8.9  --  8.3* 8.4* 8.8*  MG  --  1.7 1.5* 1.7 1.6*  PHOS  --  3.0  --   --   --    Liver Function Tests: Recent Labs  Lab 02/01/24 0228 02/02/24 0404 02/03/24 0416 02/04/24 0411  AST 19 13* 14* 16  ALT 17 11 10 10   ALKPHOS 119 88 81 81  BILITOT 0.7 1.2 1.1 1.2  PROT 7.5 6.4* 6.3* 7.1  ALBUMIN 3.7 3.0* 2.9* 3.3*   CBG: Recent Labs  Lab 02/03/24 1115 02/03/24 1613 02/03/24 2110 02/04/24 0715 02/04/24 1116  GLUCAP 118* 140* 200* 123* 108*    Discharge time spent: greater than 30 minutes.  Signed: Eric Nunnery, MD [facility] 02/04/2024

## 2024-02-04 NOTE — Plan of Care (Signed)
 Pt is alert and oriented x 4 Up adlib. No n/v noted. Severe abdominal pain continues. Dilaudid  given every 2 hours for pain. Vitals stable.  Problem: Pain Managment: Goal: General experience of comfort will improve and/or be controlled Outcome: Not Progressing   Problem: Education: Goal: Knowledge of General Education information will improve Description: Including pain rating scale, medication(s)/side effects and non-pharmacologic comfort measures Outcome: Progressing   Problem: Health Behavior/Discharge Planning: Goal: Ability to manage health-related needs will improve Outcome: Progressing   Problem: Clinical Measurements: Goal: Ability to maintain clinical measurements within normal limits will improve Outcome: Progressing Goal: Will remain free from infection Outcome: Progressing Goal: Diagnostic test results will improve Outcome: Progressing Goal: Respiratory complications will improve Outcome: Progressing Goal: Cardiovascular complication will be avoided Outcome: Progressing   Problem: Activity: Goal: Risk for activity intolerance will decrease Outcome: Progressing   Problem: Nutrition: Goal: Adequate nutrition will be maintained Outcome: Progressing   Problem: Coping: Goal: Level of anxiety will decrease Outcome: Progressing   Problem: Elimination: Goal: Will not experience complications related to bowel motility Outcome: Progressing Goal: Will not experience complications related to urinary retention Outcome: Progressing   Problem: Safety: Goal: Ability to remain free from injury will improve Outcome: Progressing   Problem: Skin Integrity: Goal: Risk for impaired skin integrity will decrease Outcome: Progressing   Problem: Education: Goal: Ability to describe self-care measures that may prevent or decrease complications (Diabetes Survival Skills Education) will improve Outcome: Progressing Goal: Individualized Educational Video(s) Outcome:  Progressing   Problem: Coping: Goal: Ability to adjust to condition or change in health will improve Outcome: Progressing   Problem: Fluid Volume: Goal: Ability to maintain a balanced intake and output will improve Outcome: Progressing   Problem: Health Behavior/Discharge Planning: Goal: Ability to identify and utilize available resources and services will improve Outcome: Progressing Goal: Ability to manage health-related needs will improve Outcome: Progressing   Problem: Metabolic: Goal: Ability to maintain appropriate glucose levels will improve Outcome: Progressing   Problem: Nutritional: Goal: Maintenance of adequate nutrition will improve Outcome: Progressing Goal: Progress toward achieving an optimal weight will improve Outcome: Progressing   Problem: Skin Integrity: Goal: Risk for impaired skin integrity will decrease Outcome: Progressing   Problem: Tissue Perfusion: Goal: Adequacy of tissue perfusion will improve Outcome: Progressing

## 2024-02-05 ENCOUNTER — Telehealth: Payer: Self-pay | Admitting: *Deleted

## 2024-02-05 NOTE — Transitions of Care (Post Inpatient/ED Visit) (Signed)
   02/05/2024  Name: Jillian Shepherd MRN: [id number] DOB: February 19, 1986  Today's TOC FU Call Status: Today's TOC FU Call Status:: Unsuccessful Call (1st Attempt) Unsuccessful Call (1st Attempt) Date: 02/05/24  Attempted to reach the patient regarding the most recent Inpatient/ED visit.  Follow Up Plan: Additional outreach attempts will be made to reach the patient to complete the Transitions of Care (Post Inpatient/ED visit) call.   Mliss Creed [facility], BSN RN Care Manager/ Transition of Care [place]/ [facility] [phone]

## 2024-02-10 ENCOUNTER — Telehealth: Payer: Self-pay

## 2024-02-10 NOTE — Telephone Encounter (Signed)
 Copied from CRM [id number]. Topic: Clinical - Medical Advice >> Feb 10, 2024 10:46 AM Hillary B wrote: Reason for CRM: Patient was seen in the hospital for 5 days due to pancreas pain. She was discharged on 7/2 and was informed to follow up with her PCP, Dr. Duanne. I scheduled the patient on the soonest appointment, 7/14 as it falls within the 14 day window, but she is requesting a sooner appointment. I added her to the wait list. The patient stated that if she cannot get rescheduled to a sooner appointment, she is requesting pain medication to be sent for her as she was not given any from her hospital visit. Please review and contact the patient with best next steps.

## 2024-02-11 ENCOUNTER — Telehealth: Payer: Self-pay | Admitting: *Deleted

## 2024-02-11 NOTE — Transitions of Care (Post Inpatient/ED Visit) (Signed)
   02/11/2024  Name: Jillian Shepherd MRN: [id number] DOB: [birth year]  Today's TOC FU Call Status: Today's TOC FU Call Status:: Unsuccessful Call (2nd Attempt) Unsuccessful Call (2nd Attempt) Date: 02/11/24  Attempted to reach the patient regarding the most recent Inpatient/ED visit.  Follow Up Plan: Additional outreach attempts will be made to reach the patient to complete the Transitions of Care (Post Inpatient/ED visit) call.   Mliss Creed [facility], BSN RN Care Manager/ Transition of Care [place]/ [facility] [phone]

## 2024-02-12 ENCOUNTER — Telehealth: Payer: Self-pay | Admitting: *Deleted

## 2024-02-12 NOTE — Transitions of Care (Post Inpatient/ED Visit) (Signed)
   02/12/2024  Name: Jillian Shepherd MRN: [id number] DOB: Apr 21, 1986  Today's TOC FU Call Status: Today's TOC FU Call Status:: Unsuccessful Call (3rd Attempt) Unsuccessful Call (3rd Attempt) Date: 02/12/24  Attempted to reach the patient regarding the most recent Inpatient/ED visit.  Follow Up Plan: No further outreach attempts will be made at this time. We have been unable to contact the patient.  Andrea Dimes RN, BSN [place]  [facility] RN Care Manager [phone]

## 2024-02-14 ENCOUNTER — Ambulatory Visit: Admitting: Family Medicine

## 2024-02-14 DIAGNOSIS — E1165 Type 2 diabetes mellitus with hyperglycemia: Secondary | ICD-10-CM

## 2024-02-14 DIAGNOSIS — K852 Alcohol induced acute pancreatitis without necrosis or infection: Secondary | ICD-10-CM

## 2024-02-14 DIAGNOSIS — Z794 Long term (current) use of insulin: Secondary | ICD-10-CM

## 2024-02-14 MED ORDER — PIOGLITAZONE HCL 30 MG PO TABS: 30.0000 mg | ORAL_TABLET | Freq: Every day | ORAL | 3 refills | Status: DC

## 2024-02-14 MED ORDER — BLOOD GLUCOSE MONITORING SUPPL DEVI: 1.0000 | Freq: Three times a day (TID) | 0 refills | Status: DC

## 2024-02-14 MED ORDER — LANCETS MISC. MISC: 1.0000 | Freq: Three times a day (TID) | 0 refills | Status: AC

## 2024-02-14 MED ORDER — OXYCODONE-ACETAMINOPHEN 5-325 MG PO TABS: 1.0000 | ORAL_TABLET | Freq: Four times a day (QID) | ORAL | 0 refills | Status: AC | PRN

## 2024-02-14 MED ORDER — LANCET DEVICE MISC: 1.0000 | Freq: Three times a day (TID) | 0 refills | Status: AC

## 2024-02-14 MED ORDER — BLOOD GLUCOSE TEST VI STRP: 1.0000 | ORAL_STRIP | Freq: Three times a day (TID) | 0 refills | Status: DC

## 2024-02-14 NOTE — Progress Notes (Signed)
 Subjective:    Patient ID: Jillian Shepherd, female    DOB: 01/07/1986, 38 y.o.   MRN: [id number] Patient has a history of alcohol induced pancreatitis.  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  Was recently hospitalized with recurrent pancreatitis.  She admits that she has been drinking alcohol.  Was also found to have hypomagnesemia and hypokalemia.  Was treated with IV fluids.  After discharge from the hospital, the patient reports severe abdominal pain in the epigastric area on a daily basis.  She has no desire to eat.  Food exacerbates her abdominal pain.  She refuses to return to the hospital.  She denies any fevers or chills.  She states that her last drink of alcohol was Monday/5 days ago.  She denies any delirium tremens.  She is not checking her blood sugars.  She supposed to be on 30 units of Lantus .  She is taking 10 units of Lantus .  Recent hemoglobin A1c was 12.6. Past Medical History:  Diagnosis Date   Abdominal pain    Alcohol abuse    B12 deficiency    Cancer (HCC)    squmaous cell skin cancer    Collagen vascular disease (HCC)    Diabetes mellitus without complication (HCC)    Hypertension    Hypokalemia    Liddle's syndrome    Obesity (BMI 35.0-39.9 without comorbidity)    Pancreatitis    Pleural effusion    Splenic hemorrhage    Past Surgical History:  Procedure Laterality Date   ADENOIDECTOMY     CHEST TUBE INSERTION     COLONOSCOPY WITH PROPOFOL  N/A 10/06/2020   Procedure: COLONOSCOPY WITH PROPOFOL ;  Surgeon: Unk Corinn Skiff, MD;  Location: [facility] ENDOSCOPY;  Service: Gastroenterology;  Laterality: N/A;  COVID POSITIVE 08/23/2020   ESOPHAGOGASTRODUODENOSCOPY (EGD) WITH PROPOFOL  N/A 10/06/2020   Procedure: ESOPHAGOGASTRODUODENOSCOPY (EGD) WITH PROPOFOL ;  Surgeon: Unk Corinn Skiff, MD;  Location: [facility] ENDOSCOPY;  Service: Gastroenterology;  Laterality: N/A;   skin cancer removal Left    leg    TONSILLECTOMY     Current Outpatient Medications on File Prior to Visit  Medication Sig Dispense Refill   acetaminophen  (TYLENOL ) 500 MG tablet Take 1,000 mg by mouth every 6 (six) hours as needed for mild pain (pain score 1-3).     Blood Glucose Monitoring Suppl DEVI 1 each by Does not apply route in the morning, at noon, and at bedtime. May substitute to any manufacturer covered by patient's insurance. 1 each 0   Continuous Glucose Sensor (FREESTYLE LIBRE 3 PLUS SENSOR) MISC Change sensor every 15 days. 2 each 5   insulin  glargine (LANTUS  SOLOSTAR) 100 UNIT/ML Solostar Pen Inject 20 Units into the skin daily. 15 mL 5   Insulin  Pen Needle (SURE-FINE PEN NEEDLES) 31G X 8 MM MISC Use to administer insulin  daily. 100 each 1   lipase/protease/amylase (CREON ) 36000 UNITS CPEP capsule Take 2 capsules (72,000 Units total) by mouth 3 (three) times daily with meals. 300 capsule 2   lisinopril  (ZESTRIL ) 40 MG tablet Take 1 tablet (40 mg total) by mouth daily. 30 tablet 1   ondansetron  (ZOFRAN -ODT) 8 MG disintegrating tablet Take 1 tablet (8 mg total) by mouth every 8 (eight) hours as needed. 30 tablet 0   amitriptyline (ELAVIL) 50 MG tablet Take 50 mg by mouth at bedtime as needed for sleep. (Patient not taking: Reported on 02/14/2024)     oxyCODONE  (ROXICODONE ) 5 MG immediate release tablet Take 1 tablet (5  mg total) by mouth every 8 (eight) hours as needed for severe pain (pain score 7-10). (Patient not taking: Reported on 02/14/2024) 25 tablet 0   pantoprazole  (PROTONIX ) 40 MG tablet Take 1 tablet (40 mg total) by mouth daily. (Patient not taking: Reported on 02/14/2024) 30 tablet 1   No current facility-administered medications on file prior to visit.   No Known Allergies Social History   Socioeconomic History   Marital status: Married    Spouse name: Not on file   Number of children: Not on file   Years of education: Not on file   Highest education level: Not on file  Occupational History   Not  on file  Tobacco Use   Smoking status: Every Day    Current packs/day: 0.25    Average packs/day: 0.3 packs/day for 9.0 years (2.3 ttl pk-yrs)    Types: Cigarettes    Passive exposure: Current   Smokeless tobacco: Never  Vaping Use   Vaping status: Never Used  Substance and Sexual Activity   Alcohol use: Yes    Alcohol/week: 2.0 standard drinks of alcohol    Types: 2 Cans of beer per week   Drug use: No   Sexual activity: Yes    Birth control/protection: None  Other Topics Concern   Not on file  Social History Narrative   ** Merged History Encounter **       Social Drivers of Health   Financial Resource Strain: Not on file  Food Insecurity: No Food Insecurity (02/01/2024)   Hunger Vital Sign    Worried About Running Out of Food in the Last Year: Never true    Ran Out of Food in the Last Year: Never true  Transportation Needs: No Transportation Needs (02/01/2024)   PRAPARE - Administrator, Civil Service (Medical): No    Lack of Transportation (Non-Medical): No  Physical Activity: Not on file  Stress: Not on file  Social Connections: Socially Integrated (02/01/2024)   Social Connection and Isolation Panel    Frequency of Communication with Friends and Family: More than three times a week    Frequency of Social Gatherings with Friends and Family: More than three times a week    Attends Religious Services: 1 to 4 times per year    Active Member of [employer] or Organizations: No    Attends Banker Meetings: 1 to 4 times per year    Marital Status: Married  Catering manager Violence: Not At Risk (02/01/2024)   Humiliation, Afraid, Rape, and Kick questionnaire    Fear of Current or Ex-Partner: No    Emotionally Abused: No    Physically Abused: No    Sexually Abused: No     Review of Systems  All other systems reviewed and are negative.      Objective:   Physical Exam Vitals reviewed.  Constitutional:      General: She is not in acute distress.     Appearance: Normal appearance. She is normal weight. She is not ill-appearing, toxic-appearing or diaphoretic.  HENT:     Nose: Nose normal. No congestion or rhinorrhea.     Mouth/Throat:     Pharynx: Oropharynx is clear. No oropharyngeal exudate or posterior oropharyngeal erythema.  Eyes:     General: No scleral icterus.    Conjunctiva/sclera: Conjunctivae normal.  Cardiovascular:     Rate and Rhythm: Normal rate and regular rhythm.     Pulses: Normal pulses.     Heart sounds: Normal  heart sounds. No murmur heard.    No friction rub. No gallop.  Pulmonary:     Effort: Pulmonary effort is normal. No respiratory distress.     Breath sounds: No stridor. No decreased breath sounds, wheezing, rhonchi or rales.  Chest:     Chest wall: No tenderness.  Abdominal:     General: Abdomen is flat. Bowel sounds are normal. There is no distension.     Palpations: Abdomen is soft. There is no mass.     Tenderness: There is abdominal tenderness. There is no right CVA tenderness, left CVA tenderness, guarding or rebound.     Hernia: No hernia is present.  Musculoskeletal:     Cervical back: Neck supple.  Neurological:     Mental Status: She is alert.           Assessment & Plan:  Hypomagnesemia - Plan: Comprehensive metabolic panel with GFR, Magnesium   Alcohol-induced acute pancreatitis, unspecified complication status  Type 2 diabetes mellitus with hyperglycemia, without long-term current use of insulin  (HCC) I had a long discussion with the patient today.  Explained to her that I do not want to provide pain medication simply so that she can mask the pain and continue drinking.  Second, I emphasized to the patient that she is going to die if she does not seek treatment for her alcoholism.  I recommended Alcoholics Anonymous.  I gave her contact information.  I begged her to seek treatment.  Third I would increase her insulin  to 30 units a day and add Actos  30 mg a day as an insulin   sensitizer to help lower her blood sugars.  I would like to recheck her fasting blood sugars and 2-hour postprandial sugars in 2 weeks.  Fourth I will check her magnesium  and potassium level.  Continue Creon  1 capsule with each meal.  Fifth I did refill Percocet 5/325 1 every 6 hours for abdominal pain.  I gave her 60 tablets.  I will not refill this.  She needs to use the medication sparingly and avoid alcohol which will exacerbate her pancreatitis

## 2024-02-15 LAB — COMPREHENSIVE METABOLIC PANEL WITH GFR
AG Ratio: 1.3 (calc) (ref 1.0–2.5)
ALT: 16 U/L (ref 6–29)
AST: 22 U/L (ref 10–30)
Albumin: 4.4 g/dL (ref 3.6–5.1)
Alkaline phosphatase (APISO): 118 U/L (ref 31–125)
BUN: 8 mg/dL (ref 7–25)
CO2: 25 mmol/L (ref 20–32)
Calcium: 9.8 mg/dL (ref 8.6–10.2)
Chloride: 96 mmol/L — ABNORMAL LOW (ref 98–110)
Creat: 0.55 mg/dL (ref 0.50–0.97)
Globulin: 3.4 g/dL (ref 1.9–3.7)
Glucose, Bld: 450 mg/dL — ABNORMAL HIGH (ref 65–99)
Potassium: 4 mmol/L (ref 3.5–5.3)
Sodium: 135 mmol/L (ref 135–146)
Total Bilirubin: 0.2 mg/dL (ref 0.2–1.2)
Total Protein: 7.8 g/dL (ref 6.1–8.1)
eGFR: 120 mL/min/1.73m2 (ref >=60)

## 2024-02-15 LAB — MAGNESIUM: Magnesium: 1.8 mg/dL (ref 1.5–2.5)

## 2024-02-17 ENCOUNTER — Ambulatory Visit: Payer: Self-pay | Admitting: Family Medicine

## 2024-02-17 ENCOUNTER — Inpatient Hospital Stay: Admitting: Family Medicine

## 2024-02-17 NOTE — Progress Notes (Signed)
 Pt. Notified by my chart result letter.

## 2024-03-05 ENCOUNTER — Other Ambulatory Visit: Payer: Self-pay | Admitting: Family Medicine

## 2024-03-05 DIAGNOSIS — E1165 Type 2 diabetes mellitus with hyperglycemia: Secondary | ICD-10-CM

## 2024-03-05 DIAGNOSIS — K861 Other chronic pancreatitis: Secondary | ICD-10-CM

## 2024-03-12 ENCOUNTER — Encounter: Admitting: Family Medicine

## 2024-03-13 ENCOUNTER — Other Ambulatory Visit: Payer: Self-pay | Admitting: Family Medicine

## 2024-03-17 ENCOUNTER — Other Ambulatory Visit: Payer: Self-pay | Admitting: Family Medicine

## 2024-03-25 ENCOUNTER — Other Ambulatory Visit: Payer: Self-pay

## 2024-03-25 ENCOUNTER — Encounter (HOSPITAL_COMMUNITY): Payer: Self-pay | Admitting: Emergency Medicine

## 2024-03-25 ENCOUNTER — Emergency Department (HOSPITAL_COMMUNITY)

## 2024-03-25 ENCOUNTER — Inpatient Hospital Stay (HOSPITAL_COMMUNITY)
Admission: EM | Admit: 2024-03-25 | Discharge: 2024-03-28 | DRG: 439 | Disposition: A | Discharge status: Home / Self Care | Attending: Internal Medicine | Admitting: Internal Medicine

## 2024-03-25 DIAGNOSIS — E66811 Obesity, class 1: Secondary | ICD-10-CM | POA: Diagnosis present

## 2024-03-25 DIAGNOSIS — D6959 Other secondary thrombocytopenia: Secondary | ICD-10-CM | POA: Diagnosis present

## 2024-03-25 DIAGNOSIS — K859 Acute pancreatitis without necrosis or infection, unspecified: Secondary | ICD-10-CM | POA: Diagnosis not present

## 2024-03-25 DIAGNOSIS — K863 Pseudocyst of pancreas: Secondary | ICD-10-CM | POA: Diagnosis present

## 2024-03-25 DIAGNOSIS — K860 Alcohol-induced chronic pancreatitis: Secondary | ICD-10-CM | POA: Diagnosis present

## 2024-03-25 DIAGNOSIS — F1010 Alcohol abuse, uncomplicated: Secondary | ICD-10-CM | POA: Diagnosis present

## 2024-03-25 DIAGNOSIS — Z794 Long term (current) use of insulin: Secondary | ICD-10-CM | POA: Diagnosis not present

## 2024-03-25 DIAGNOSIS — K219 Gastro-esophageal reflux disease without esophagitis: Secondary | ICD-10-CM | POA: Diagnosis present

## 2024-03-25 DIAGNOSIS — F17210 Nicotine dependence, cigarettes, uncomplicated: Secondary | ICD-10-CM | POA: Diagnosis present

## 2024-03-25 DIAGNOSIS — R1011 Right upper quadrant pain: Secondary | ICD-10-CM | POA: Diagnosis present

## 2024-03-25 DIAGNOSIS — Z833 Family history of diabetes mellitus: Secondary | ICD-10-CM | POA: Diagnosis not present

## 2024-03-25 DIAGNOSIS — E876 Hypokalemia: Secondary | ICD-10-CM | POA: Diagnosis present

## 2024-03-25 DIAGNOSIS — Z79899 Other long term (current) drug therapy: Secondary | ICD-10-CM

## 2024-03-25 DIAGNOSIS — R1010 Upper abdominal pain, unspecified: Secondary | ICD-10-CM | POA: Diagnosis not present

## 2024-03-25 DIAGNOSIS — K852 Alcohol induced acute pancreatitis without necrosis or infection: Principal | ICD-10-CM | POA: Diagnosis present

## 2024-03-25 DIAGNOSIS — I10 Essential (primary) hypertension: Secondary | ICD-10-CM | POA: Diagnosis present

## 2024-03-25 DIAGNOSIS — Z808 Family history of malignant neoplasm of other organs or systems: Secondary | ICD-10-CM | POA: Diagnosis not present

## 2024-03-25 DIAGNOSIS — R109 Unspecified abdominal pain: Secondary | ICD-10-CM | POA: Diagnosis present

## 2024-03-25 DIAGNOSIS — R112 Nausea with vomiting, unspecified: Secondary | ICD-10-CM | POA: Diagnosis present

## 2024-03-25 DIAGNOSIS — Z8249 Family history of ischemic heart disease and other diseases of the circulatory system: Secondary | ICD-10-CM

## 2024-03-25 DIAGNOSIS — Z85828 Personal history of other malignant neoplasm of skin: Secondary | ICD-10-CM | POA: Diagnosis not present

## 2024-03-25 DIAGNOSIS — K760 Fatty (change of) liver, not elsewhere classified: Secondary | ICD-10-CM | POA: Diagnosis present

## 2024-03-25 DIAGNOSIS — Z6830 Body mass index (BMI) 30.0-30.9, adult: Secondary | ICD-10-CM

## 2024-03-25 DIAGNOSIS — K861 Other chronic pancreatitis: Secondary | ICD-10-CM

## 2024-03-25 DIAGNOSIS — E1165 Type 2 diabetes mellitus with hyperglycemia: Secondary | ICD-10-CM | POA: Diagnosis present

## 2024-03-25 LAB — URINALYSIS, ROUTINE W REFLEX MICROSCOPIC
Bilirubin Urine: NEGATIVE
Glucose, UA: >=500 mg/dL — AB
Ketones, ur: 20 mg/dL — AB
Nitrite: NEGATIVE
Protein, ur: 30 mg/dL — AB
Specific Gravity, Urine: 1.028 (ref 1.005–1.030)
pH: 5 (ref 5.0–8.0)

## 2024-03-25 LAB — CBC
HCT: 49 % — ABNORMAL HIGH (ref 36.0–46.0)
Hemoglobin: 16.5 g/dL — ABNORMAL HIGH (ref 12.0–15.0)
MCH: 30.8 pg (ref 26.0–34.0)
MCHC: 33.7 g/dL (ref 30.0–36.0)
MCV: 91.6 fL (ref 80.0–100.0)
Platelets: 178 K/uL (ref 150–400)
RBC: 5.35 MIL/uL — ABNORMAL HIGH (ref 3.87–5.11)
RDW: 15 % (ref 11.5–15.5)
WBC: 8.4 K/uL (ref 4.0–10.5)
nRBC: 0 % (ref 0.0–0.2)

## 2024-03-25 LAB — MAGNESIUM: Magnesium: 1.3 mg/dL — ABNORMAL LOW (ref 1.7–2.4)

## 2024-03-25 LAB — COMPREHENSIVE METABOLIC PANEL WITH GFR
ALT: 13 U/L (ref 0–44)
AST: 29 U/L (ref 15–41)
Albumin: 3.8 g/dL (ref 3.5–5.0)
Alkaline Phosphatase: 135 U/L — ABNORMAL HIGH (ref 38–126)
Anion gap: 16 — ABNORMAL HIGH (ref 5–15)
BUN: 8 mg/dL (ref 6–20)
CO2: 28 mmol/L (ref 22–32)
Calcium: 8.8 mg/dL — ABNORMAL LOW (ref 8.9–10.3)
Chloride: 92 mmol/L — ABNORMAL LOW (ref 98–111)
Creatinine, Ser: 0.58 mg/dL (ref 0.44–1.00)
GFR, Estimated: >60 mL/min (ref >60)
Glucose, Bld: 252 mg/dL — ABNORMAL HIGH (ref 70–99)
Potassium: 2.9 mmol/L — ABNORMAL LOW (ref 3.5–5.1)
Sodium: 136 mmol/L (ref 135–145)
Total Bilirubin: 0.9 mg/dL (ref 0.0–1.2)
Total Protein: 7.6 g/dL (ref 6.5–8.1)

## 2024-03-25 LAB — LIPASE, BLOOD: Lipase: 38 U/L (ref 11–51)

## 2024-03-25 LAB — POC URINE PREG, ED: Preg Test, Ur: NEGATIVE

## 2024-03-25 LAB — PREGNANCY, URINE: Preg Test, Ur: NEGATIVE

## 2024-03-25 MED ORDER — POTASSIUM CHLORIDE 10 MEQ/100ML IV SOLN
10.0000 meq | INTRAVENOUS | Status: AC
  Administered 2024-03-25 (×2): 10 meq via INTRAVENOUS
  Filled 2024-03-25 (×2): qty 100

## 2024-03-25 MED ORDER — POTASSIUM CHLORIDE 10 MEQ/100ML IV SOLN
10.0000 meq | INTRAVENOUS | Status: AC
  Administered 2024-03-26 (×2): 10 meq via INTRAVENOUS
  Filled 2024-03-25 (×2): qty 100

## 2024-03-25 MED ORDER — LACTATED RINGERS IV SOLN: INTRAVENOUS | Status: DC

## 2024-03-25 MED ORDER — ACETAMINOPHEN 325 MG PO TABS
650.0000 mg | ORAL_TABLET | Freq: Four times a day (QID) | ORAL | Status: DC | PRN
  Administered 2024-03-27: 650 mg via ORAL
  Filled 2024-03-25: qty 2

## 2024-03-25 MED ORDER — HYDROMORPHONE HCL 1 MG/ML IJ SOLN
0.5000 mg | INTRAMUSCULAR | Status: DC | PRN
  Administered 2024-03-25 – 2024-03-26 (×3): 0.5 mg via INTRAVENOUS
  Filled 2024-03-25 (×3): qty 0.5

## 2024-03-25 MED ORDER — SODIUM CHLORIDE 0.9 % IV BOLUS
1000.0000 mL | Freq: Once | INTRAVENOUS | Status: AC
  Administered 2024-03-25: 1000 mL via INTRAVENOUS

## 2024-03-25 MED ORDER — PROCHLORPERAZINE EDISYLATE 10 MG/2ML IJ SOLN
10.0000 mg | Freq: Four times a day (QID) | INTRAMUSCULAR | Status: DC | PRN
  Administered 2024-03-27: 10 mg via INTRAVENOUS
  Filled 2024-03-25: qty 2

## 2024-03-25 MED ORDER — PANTOPRAZOLE SODIUM 40 MG IV SOLR
40.0000 mg | INTRAVENOUS | Status: DC
  Administered 2024-03-25 – 2024-03-27 (×3): 40 mg via INTRAVENOUS
  Filled 2024-03-25 (×3): qty 10

## 2024-03-25 MED ORDER — INSULIN GLARGINE 100 UNIT/ML ~~LOC~~ SOLN
10.0000 [IU] | Freq: Every day | SUBCUTANEOUS | Status: DC
  Administered 2024-03-26: 10 [IU] via SUBCUTANEOUS
  Filled 2024-03-25 (×2): qty 0.1

## 2024-03-25 MED ORDER — INSULIN ASPART 100 UNIT/ML IJ SOLN: 0.0000 [IU] | Freq: Every day | INTRAMUSCULAR | Status: DC

## 2024-03-25 MED ORDER — DIPHENHYDRAMINE HCL 50 MG/ML IJ SOLN
25.0000 mg | Freq: Once | INTRAMUSCULAR | Status: AC
  Administered 2024-03-25: 25 mg via INTRAVENOUS
  Filled 2024-03-25: qty 1

## 2024-03-25 MED ORDER — ACETAMINOPHEN 650 MG RE SUPP: 650.0000 mg | Freq: Four times a day (QID) | RECTAL | Status: DC | PRN

## 2024-03-25 MED ORDER — CALCIUM CARBONATE 1250 (500 CA) MG PO TABS
1.0000 | ORAL_TABLET | Freq: Every day | ORAL | Status: DC
  Administered 2024-03-26 – 2024-03-28 (×3): 1250 mg via ORAL
  Filled 2024-03-25 (×3): qty 1

## 2024-03-25 MED ORDER — METOCLOPRAMIDE HCL 5 MG/ML IJ SOLN
10.0000 mg | Freq: Once | INTRAMUSCULAR | Status: AC
Start: 2024-03-25 — End: 2024-03-25
  Administered 2024-03-25: 10 mg via INTRAVENOUS
  Filled 2024-03-25: qty 2

## 2024-03-25 MED ORDER — INSULIN ASPART 100 UNIT/ML IJ SOLN
0.0000 [IU] | Freq: Three times a day (TID) | INTRAMUSCULAR | Status: DC
  Administered 2024-03-26: 5 [IU] via SUBCUTANEOUS
  Administered 2024-03-26 – 2024-03-27 (×2): 2 [IU] via SUBCUTANEOUS
  Administered 2024-03-27: 3 [IU] via SUBCUTANEOUS
  Filled 2024-03-25: qty 1

## 2024-03-25 MED ORDER — MORPHINE SULFATE (PF) 4 MG/ML IV SOLN
4.0000 mg | Freq: Once | INTRAVENOUS | Status: AC
  Administered 2024-03-25: 4 mg via INTRAVENOUS
  Filled 2024-03-25: qty 1

## 2024-03-25 MED ORDER — MAGNESIUM SULFATE 2 GM/50ML IV SOLN
2.0000 g | Freq: Once | INTRAVENOUS | Status: AC
  Administered 2024-03-25: 2 g via INTRAVENOUS
  Filled 2024-03-25: qty 50

## 2024-03-25 MED ORDER — ENOXAPARIN SODIUM 40 MG/0.4ML IJ SOSY
40.0000 mg | PREFILLED_SYRINGE | INTRAMUSCULAR | Status: DC
  Administered 2024-03-25: 40 mg via SUBCUTANEOUS
  Filled 2024-03-25 (×3): qty 0.4

## 2024-03-25 MED ORDER — IOHEXOL 300 MG/ML  SOLN
100.0000 mL | Freq: Once | INTRAMUSCULAR | Status: AC | PRN
  Administered 2024-03-25: 100 mL via INTRAVENOUS

## 2024-03-25 NOTE — ED Provider Notes (Signed)
 [place] EMERGENCY DEPARTMENT AT [facility] Provider Note   CSN: [id number] Arrival date & time: 03/25/24  1621     Patient presents with: Abdominal Pain   Jillian Shepherd is a 38 y.o. female.  History of alcohol abuse, alcoholic pancreatitis.  Presents ER for upper abdominal pain radiating to her back that started last night as nausea has worsened throughout the day today with vomiting.  No fevers or chills.  She denies to keep anything down.  She states she does not is due to drinking alcohol last night.  This feels the same as prior episodes of pancreatitis for which she has had to be admitted in the past.  She states she feels dehydrated as well.  No chest pain or shortness of breath.  No leg pain or swelling.  She denies history of abdominal surgeries. No GI bleeding       Abdominal Pain      Prior to Admission medications   Medication Sig Start Date End Date Taking? Authorizing Provider  ACCU-CHEK GUIDE TEST test strip USE IN THE MORNING, AT NOON, AND AT BEDTIME. MAY SUBSTITUTE TO ANY MANUFACTURER 03/13/24   Duanne Butler DASEN, MD  Accu-Chek Softclix Lancets lancets USE IN THE MORNING, AT NOON, AND AT BEDTIME. MAY SUBSTITUTE 03/17/24   Duanne Butler DASEN, MD  acetaminophen  (TYLENOL ) 500 MG tablet Take 1,000 mg by mouth every 6 (six) hours as needed for mild pain (pain score 1-3).    [provider]  amitriptyline (ELAVIL) 50 MG tablet Take 50 mg by mouth at bedtime as needed for sleep. Patient not taking: Reported on 02/14/2024 04/01/23   [provider]  BD PEN NEEDLE SHORT ULTRAFINE 31G X 8 MM MISC USE TO ADMINISTER INSULIN  DAILY. 03/05/24   Duanne Butler DASEN, MD  Blood Glucose Monitoring Suppl DEVI 1 each by Does not apply route in the morning, at noon, and at bedtime. May substitute to any manufacturer covered by patient's insurance. 08/15/23   Duanne Butler DASEN, MD  Blood Glucose Monitoring Suppl DEVI 1 each by Does not apply route in the morning, at  noon, and at bedtime. May substitute to any manufacturer covered by patient's insurance. 02/14/24   Duanne Butler DASEN, MD  Continuous Glucose Sensor (FREESTYLE LIBRE 3 PLUS SENSOR) MISC Change sensor every 15 days. 01/16/24   Duanne Butler DASEN, MD  insulin  glargine (LANTUS  SOLOSTAR) 100 UNIT/ML Solostar Pen Inject 20 Units into the skin daily. 08/15/23   Duanne Butler DASEN, MD  lipase/protease/amylase (CREON ) 36000 UNITS CPEP capsule Take 2 capsules (72,000 Units total) by mouth 3 (three) times daily with meals. 02/04/24   Ricky Fines, MD  lisinopril  (ZESTRIL ) 40 MG tablet Take 1 tablet (40 mg total) by mouth daily. 02/05/24   Ricky Fines, MD  ondansetron  (ZOFRAN -ODT) 8 MG disintegrating tablet Take 1 tablet (8 mg total) by mouth every 8 (eight) hours as needed. 02/04/24   Ricky Fines, MD  oxyCODONE  (ROXICODONE ) 5 MG immediate release tablet Take 1 tablet (5 mg total) by mouth every 8 (eight) hours as needed for severe pain (pain score 7-10). Patient not taking: Reported on 02/14/2024 02/04/24   Ricky Fines, MD  pantoprazole  (PROTONIX ) 40 MG tablet Take 1 tablet (40 mg total) by mouth daily. Patient not taking: Reported on 02/14/2024 02/04/24 02/03/25  Ricky Fines, MD  pioglitazone  (ACTOS ) 30 MG tablet Take 1 tablet (30 mg total) by mouth daily. 02/14/24   Duanne Butler DASEN, MD    Allergies: Patient has no known  allergies.    Review of Systems  Gastrointestinal:  Positive for abdominal pain.    Updated Vital Signs BP (!) 148/97 (BP Location: Right Arm)   Pulse (!) 103   Temp 98.9 F (37.2 C) (Oral)   Resp 19   SpO2 100%   Physical Exam Vitals and nursing note reviewed.  Constitutional:      General: She is not in acute distress.    Appearance: She is well-developed.     Comments: Patient appears uncomfortable  HENT:     Head: Normocephalic and atraumatic.  Eyes:     Conjunctiva/sclera: Conjunctivae normal.  Cardiovascular:     Rate and Rhythm: Normal rate and regular rhythm.      Heart sounds: No murmur heard. Pulmonary:     Effort: Pulmonary effort is normal. No respiratory distress.     Breath sounds: Normal breath sounds.  Abdominal:     Palpations: Abdomen is soft.     Tenderness: There is abdominal tenderness in the epigastric area. There is no right CVA tenderness, left CVA tenderness or guarding.  Musculoskeletal:        General: No swelling.     Cervical back: Neck supple.  Skin:    General: Skin is warm and dry.     Capillary Refill: Capillary refill takes less than 2 seconds.  Neurological:     Mental Status: She is alert.  Psychiatric:        Mood and Affect: Mood normal.     (all labs ordered are listed, but only abnormal results are displayed) Labs Reviewed  LIPASE, BLOOD  COMPREHENSIVE METABOLIC PANEL WITH GFR  CBC  URINALYSIS, ROUTINE W REFLEX MICROSCOPIC  POC URINE PREG, ED    EKG: None  Radiology: No results found.   Procedures   Medications Ordered in the ED - No data to display                                  Medical Decision Making This patient presents to the ED for concern of epigastric abdominal pain and back pain that gradually started last night after drinking alcohol, this involves an extensive number of treatment options, and is a complaint that carries with it a high risk of complications and morbidity.  The differential diagnosis includes pancreatitis, cholecystitis, lower lobe pneumonia, UTI, sepsis, other   Co morbidities that complicate the patient evaluation :   Pancreatitis, alcohol use disorder   Additional history obtained:  Additional history obtained from EMR External records from outside source obtained and reviewed including prior notes, labs and imaging   Lab Tests:  I Ordered, and personally interpreted labs.  The pertinent results include: CBC with no leukocytosis, hemoglobin 16.5 likely hemoconcentration.  CMP with potassium 2.9, chloride 92, glucose is 252, anion gap 16, alk phos 135  normal bilirubin Negative pregnancy Lipase normal Magnesium  1.3 Urinalysis with trace leukocytes and many bacteria but 0-5 red blood cells, 0-5 white blood cells and 20 ketones   Imaging Studies ordered:  I ordered imaging studies including CT abdomen pelvis which shows peripancreatic stranding I independently visualized and interpreted imaging within scope of identifying emergent findings  I agree with the radiologist interpretation     Consultations Obtained:  I requested consultation with the hospitalist Dr. Manfred,  and discussed lab and imaging findings as well as pertinent plan - they recommend: Admission   Problem List / ED Course / Critical interventions /  Medication management  Abdominal pain with nausea and vomiting-feels like patient's usual pancreatitis, patient admits to drinking alcohol yesterday and drinks alcohol roughly every other day.  No signs at this time of alcohol withdrawal.  Lipase is normal but there is some peripancreatic standing on CT likely acute pancreatitis.  She still having pain and needing repeated doses of IV analgesics and also requiring fluid repletion and electrolyte repletion due to low potassium and magnesium .  Discussed with hospitalist Dr. Adefeso for admission due to continued pain and nausea. I ordered medication including morphine  for pain and Reglan  for nausea Reevaluation of the patient after these medicines showed that the patient minimally improved I have reviewed the patients home medicines and have made adjustments as needed       Amount and/or Complexity of Data Reviewed Labs: ordered. Radiology: ordered.  Risk Prescription drug management. Decision regarding hospitalization.        Final diagnoses:  None    ED Discharge Orders     None          Jillian Shepherd 03/25/24 2232    Suzette Pac, MD 03/27/24 1248

## 2024-03-25 NOTE — H&P (Signed)
 History and Physical    Patient: Jillian Shepherd FMW:[id number] DOB: 1985/11/10 DOA: 03/25/2024 DOS: the patient was seen and examined on 03/25/2024 PCP: Duanne Butler DASEN, MD  Patient coming from: Home  Chief Complaint:  Chief Complaint  Patient presents with   Abdominal Pain   HPI: Jillian Shepherd is a 38 y.o. female with medical history significant of Liddle syndrome, alcoholic pancreatitis/chronic pancreatitis with pseudocyst formation with recurrent admissions for same who presents to ED emergency department due to 3-day onset of upper abdominal pain.  She endorsed having some mixed liquor on Sunday (8/17) evening and on Monday morning, she started to complain of abdominal pain associated with nausea and NBNB vomiting with difficulty in being able to keep anything down.  Symptoms were similar to prior episodes of pancreatitis flareup.  She denies chest pain or diarrhea.  ED Course:  In the emergency department, pulse was 103 bpm, BP was 148/97.  Other vital signs were within normal range.  Workup in the ED showed WBC 8.4, hemoglobin 16.5, hematocrit 49.0, MCV 91.6, platelets 178.  BMP was normal except for potassium of 2.9, chloride 92, blood glucose 252, calcium  8.8, magnesium  1.3.  Urinalysis was positive for glycosuria, pregnancy test was negative. CT abdomen and pelvis with contrast showed mild peripancreatic stranding consistent with pancreatitis. She was treated with Reglan  and Benadryl .  IV morphine  was given for pain and her magnesium  was replenished. TRH was asked to admit patient  Review of Systems: Review of systems as noted in the HPI. All other systems reviewed and are negative.   Past Medical History:  Diagnosis Date   Abdominal pain    Alcohol abuse    B12 deficiency    Cancer (HCC)    squmaous cell skin cancer    Collagen vascular disease (HCC)    Diabetes mellitus without complication (HCC)    Hypertension    Hypokalemia    Liddle's syndrome    Obesity (BMI  35.0-39.9 without comorbidity)    Pancreatitis    Pleural effusion    Splenic hemorrhage    Past Surgical History:  Procedure Laterality Date   ADENOIDECTOMY     CHEST TUBE INSERTION     COLONOSCOPY WITH PROPOFOL  N/A 10/06/2020   Procedure: COLONOSCOPY WITH PROPOFOL ;  Surgeon: Unk Corinn Skiff, MD;  Location: [facility] ENDOSCOPY;  Service: Gastroenterology;  Laterality: N/A;  COVID POSITIVE 08/23/2020   ESOPHAGOGASTRODUODENOSCOPY (EGD) WITH PROPOFOL  N/A 10/06/2020   Procedure: ESOPHAGOGASTRODUODENOSCOPY (EGD) WITH PROPOFOL ;  Surgeon: Unk Corinn Skiff, MD;  Location: [facility] ENDOSCOPY;  Service: Gastroenterology;  Laterality: N/A;   skin cancer removal Left    leg   TONSILLECTOMY      Social History:  reports that she has been smoking cigarettes. She has a 2.3 pack-year smoking history. She has been exposed to tobacco smoke. She has never used smokeless tobacco. She reports current alcohol use of about 2.0 standard drinks of alcohol per week. She reports that she does not use drugs.   No Known Allergies  Family History  Problem Relation Age of Onset   Hypertension Father    Anxiety disorder Father    Depression Father    Melanoma Paternal Grandmother        of skin   Diabetes Paternal Grandmother    Cirrhosis Paternal Uncle      Prior to Admission medications   Medication Sig Start Date End Date Taking? Authorizing Provider  ACCU-CHEK GUIDE TEST test strip USE IN THE MORNING, AT NOON, AND AT BEDTIME. MAY SUBSTITUTE  TO ANY MANUFACTURER 03/13/24   Duanne Butler DASEN, MD  Accu-Chek Softclix Lancets lancets USE IN THE MORNING, AT NOON, AND AT BEDTIME. MAY SUBSTITUTE 03/17/24   Duanne Butler DASEN, MD  acetaminophen  (TYLENOL ) 500 MG tablet Take 1,000 mg by mouth every 6 (six) hours as needed for mild pain (pain score 1-3).    [provider]  amitriptyline (ELAVIL) 50 MG tablet Take 50 mg by mouth at bedtime as needed for sleep. Patient not taking: Reported on 02/14/2024 04/01/23    [provider]  BD PEN NEEDLE SHORT ULTRAFINE 31G X 8 MM MISC USE TO ADMINISTER INSULIN  DAILY. 03/05/24   Duanne Butler DASEN, MD  Blood Glucose Monitoring Suppl DEVI 1 each by Does not apply route in the morning, at noon, and at bedtime. May substitute to any manufacturer covered by patient's insurance. 08/15/23   Duanne Butler DASEN, MD  Blood Glucose Monitoring Suppl DEVI 1 each by Does not apply route in the morning, at noon, and at bedtime. May substitute to any manufacturer covered by patient's insurance. 02/14/24   Duanne Butler DASEN, MD  Continuous Glucose Sensor (FREESTYLE LIBRE 3 PLUS SENSOR) MISC Change sensor every 15 days. 01/16/24   Duanne Butler DASEN, MD  insulin  glargine (LANTUS  SOLOSTAR) 100 UNIT/ML Solostar Pen Inject 20 Units into the skin daily. 08/15/23   Duanne Butler DASEN, MD  lipase/protease/amylase (CREON ) 36000 UNITS CPEP capsule Take 2 capsules (72,000 Units total) by mouth 3 (three) times daily with meals. 02/04/24   Ricky Fines, MD  lisinopril  (ZESTRIL ) 40 MG tablet Take 1 tablet (40 mg total) by mouth daily. 02/05/24   Ricky Fines, MD  ondansetron  (ZOFRAN -ODT) 8 MG disintegrating tablet Take 1 tablet (8 mg total) by mouth every 8 (eight) hours as needed. 02/04/24   Ricky Fines, MD  oxyCODONE  (ROXICODONE ) 5 MG immediate release tablet Take 1 tablet (5 mg total) by mouth every 8 (eight) hours as needed for severe pain (pain score 7-10). Patient not taking: Reported on 02/14/2024 02/04/24   Ricky Fines, MD  pantoprazole  (PROTONIX ) 40 MG tablet Take 1 tablet (40 mg total) by mouth daily. Patient not taking: Reported on 02/14/2024 02/04/24 02/03/25  Ricky Fines, MD  pioglitazone  (ACTOS ) 30 MG tablet Take 1 tablet (30 mg total) by mouth daily. 02/14/24   Duanne Butler DASEN, MD    Physical Exam: BP (!) 144/93 (BP Location: Right Arm)   Pulse 90   Temp 98.9 F (37.2 C) (Oral)   Resp 18   SpO2 100%   General: 38 y.o. year-old female well developed well nourished in no acute  distress.  Alert and oriented x3. HEENT: NCAT, EOMI Neck: Supple, trachea medial Cardiovascular: Regular rate and rhythm with no rubs or gallops.  No thyromegaly or JVD noted.  No lower extremity edema. 2/4 pulses in all 4 extremities. Respiratory: Clear to auscultation with no wheezes or rales. Good inspiratory effort. Abdomen: Tender to palpation of epigastric area.  Soft, nontender nondistended with normal bowel sounds x4 quadrants. Muskuloskeletal: No cyanosis, clubbing or edema noted bilaterally Neuro: CN II-XII intact, strength 5/5 x 4, sensation, reflexes intact Skin: No ulcerative lesions noted or rashes Psychiatry: Judgement and insight appear normal. Mood is appropriate for condition and setting          Labs on Admission:  Basic Metabolic Panel: Recent Labs  Lab 03/25/24 1747 03/25/24 1847  NA 136  --   K 2.9*  --   CL 92*  --   CO2 28  --  GLUCOSE 252*  --   BUN 8  --   CREATININE 0.58  --   CALCIUM  8.8*  --   MG  --  1.3*   Liver Function Tests: Recent Labs  Lab 03/25/24 1747  AST 29  ALT 13  ALKPHOS 135*  BILITOT 0.9  PROT 7.6  ALBUMIN 3.8   Recent Labs  Lab 03/25/24 1747  LIPASE 38   No results for input(s): AMMONIA  in the last 168 hours. CBC: Recent Labs  Lab 03/25/24 1747  WBC 8.4  HGB 16.5*  HCT 49.0*  MCV 91.6  PLT 178   Cardiac Enzymes: No results for input(s): CKTOTAL, CKMB, CKMBINDEX, TROPONINI in the last 168 hours.  BNP (last 3 results) No results for input(s): BNP in the last 8760 hours.  ProBNP (last 3 results) No results for input(s): PROBNP in the last 8760 hours.  CBG: No results for input(s): GLUCAP in the last 168 hours.  Radiological Exams on Admission: CT ABDOMEN PELVIS W CONTRAST Result Date: 03/25/2024 CLINICAL DATA:  Abdomen pain EXAM: CT ABDOMEN AND PELVIS WITH CONTRAST TECHNIQUE: Multidetector CT imaging of the abdomen and pelvis was performed using the standard protocol following bolus  administration of intravenous contrast. RADIATION DOSE REDUCTION: This exam was performed according to the departmental dose-optimization program which includes automated exposure control, adjustment of the mA and/or kV according to patient size and/or use of iterative reconstruction technique. CONTRAST:  OMNIPAQUE  IOHEXOL  300 MG/ML  SOLN COMPARISON:  Ultrasound and CT 02/01/2024, 12/09/2023, 04/05/2023 and exams dating back to 2023 FINDINGS: Lower chest: Lung bases demonstrate no acute airspace disease. Hepatobiliary: Geographic hypodensities within the left hepatic lobe presumably representing steatosis. No calcified gallstone or biliary dilatation Pancreas: Dystrophic calcification in the distal pancreas. Mild peripancreatic stranding consistent with pancreatitis, slightly improved compared with June examination. Chronic pseudocysts in the distal pancreas, the larger more distal lesion measures 2.7 cm compared with 2.5 cm previously. The more medial lesion measures 20 mm compared with 22 mm previously. Spleen: Chronic subcapsular fluid collection measuring approximately 9.7 x 6.6 cm, previously 10 x 6.9 cm. Spleen otherwise negative Adrenals/Urinary Tract: Adrenal glands are normal. Kidneys show no hydronephrosis. Bladder demonstrates mild diffuse thick wall but is under distended Stomach/Bowel: Stomach within normal limits. No dilated small bowel. No acute bowel wall thickening. Negative appendix Vascular/Lymphatic: Nonaneurysmal aorta. No suspicious lymph nodes. 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 Reproductive: Uterus and bilateral adnexa are unremarkable. Other: Negative for pelvic effusion or free air Musculoskeletal: No acute or suspicious osseous abnormality IMPRESSION: 1. Mild peripancreatic stranding consistent with pancreatitis, slightly improved inflammatory changes compared with June examination. Chronic pseudocysts in the distal  pancreas, measuring up to 2.7 cm, grossly stable. 2. Chronic splenic subcapsular fluid collection, slightly decreased in size compared with June examination. 3. Other chronic findings as discussed above. 4. Geographic hypodensities within the left hepatic lobe presumably representing steatosis. Electronically Signed   By: Luke Bun M.D.   On: 03/25/2024 20:21    EKG: I independently viewed the EKG done and my findings are as followed: EKG was not done in the ED  Assessment/Plan Present on Admission:  Acute on chronic pancreatitis  Abdominal pain  Nausea & vomiting  Type 2 diabetes mellitus with hyperglycemia (HCC)  GERD (gastroesophageal reflux disease)  Essential hypertension  Alcohol abuse  Principal Problem:   Acute on chronic pancreatitis Active Problems:   Alcohol abuse   Nausea & vomiting   Essential  hypertension   Abdominal pain   GERD (gastroesophageal reflux disease)   Type 2 diabetes mellitus with hyperglycemia (HCC)   Hypocalcemia  Acute on chronic pancreatitis Abdominal pain, nausea and vomiting due to above Continue IV Compazine  p.r.n Continue IV Dilaudid  p.r.n for pain Continue Protonix  Continue Creon  Continue IV LR at 125ml/Hr Continue full liquid diet with plan to advance diet as tolerated RUQ U/S in the morning to investigate biliary etiology (gallstone and bile duct dilatation)   Hypokalemia/hypomagnesemia K+ 2.9, this was replenished Mg 1.3, this was replenished  Hypocalcemia Calcium  8.8 Continue Os-Cal   T2DM with hyperglycemia Hemoglobin A1c on 01/16/2023 was 12.5 Continue Semglee  10 units nightly and adjust dose accordingly Continue ISS and hypoglycemia protocol   Essential hypertension Continue lisinopril   GERD Continue Protonix    Alcohol abuse Patient was counseled on alcohol abuse cessation   DVT prophylaxis: Lovenox    Code Status: Full code  Family Communication: None at bedside  Consults: None  Severity of Illness: The  appropriate patient status for this patient is INPATIENT. Inpatient status is judged to be reasonable and necessary in order to provide the required intensity of service to ensure the patient's safety. The patient's presenting symptoms, physical exam findings, and initial radiographic and laboratory data in the context of their chronic comorbidities is felt to place them at high risk for further clinical deterioration. Furthermore, it is not anticipated that the patient will be medically stable for discharge from the hospital within 2 midnights of admission.   * I certify that at the point of admission it is my clinical judgment that the patient will require inpatient hospital care spanning beyond 2 midnights from the point of admission due to high intensity of service, high risk for further deterioration and high frequency of surveillance required.*  Author: Montrell Cessna, DO 03/25/2024 10:42 PM  For on call review [email or web address].

## 2024-03-25 NOTE — ED Triage Notes (Signed)
 Pt with RUQ pain stabbing in nature.  Progressively worse since this morning.  Pt has had pancreatitis in the past.  Nausea and vomiting.

## 2024-03-26 ENCOUNTER — Inpatient Hospital Stay (HOSPITAL_COMMUNITY)

## 2024-03-26 DIAGNOSIS — K859 Acute pancreatitis without necrosis or infection, unspecified: Secondary | ICD-10-CM | POA: Diagnosis not present

## 2024-03-26 DIAGNOSIS — E876 Hypokalemia: Secondary | ICD-10-CM | POA: Diagnosis not present

## 2024-03-26 LAB — COMPREHENSIVE METABOLIC PANEL WITH GFR
ALT: 11 U/L (ref 0–44)
AST: 23 U/L (ref 15–41)
Albumin: 2.8 g/dL — ABNORMAL LOW (ref 3.5–5.0)
Alkaline Phosphatase: 97 U/L (ref 38–126)
Anion gap: 10 (ref 5–15)
BUN: 6 mg/dL (ref 6–20)
CO2: 24 mmol/L (ref 22–32)
Calcium: 7.2 mg/dL — ABNORMAL LOW (ref 8.9–10.3)
Chloride: 103 mmol/L (ref 98–111)
Creatinine, Ser: 0.38 mg/dL — ABNORMAL LOW (ref 0.44–1.00)
GFR, Estimated: >60 mL/min (ref >60)
Glucose, Bld: 152 mg/dL — ABNORMAL HIGH (ref 70–99)
Potassium: 2.8 mmol/L — ABNORMAL LOW (ref 3.5–5.1)
Sodium: 137 mmol/L (ref 135–145)
Total Bilirubin: 0.7 mg/dL (ref 0.0–1.2)
Total Protein: 5.8 g/dL — ABNORMAL LOW (ref 6.5–8.1)

## 2024-03-26 LAB — BASIC METABOLIC PANEL WITH GFR
Anion gap: 10 (ref 5–15)
BUN: 5 mg/dL — ABNORMAL LOW (ref 6–20)
CO2: 21 mmol/L — ABNORMAL LOW (ref 22–32)
Calcium: 7.2 mg/dL — ABNORMAL LOW (ref 8.9–10.3)
Chloride: 107 mmol/L (ref 98–111)
Creatinine, Ser: 0.34 mg/dL — ABNORMAL LOW (ref 0.44–1.00)
GFR, Estimated: >60 mL/min (ref >60)
Glucose, Bld: 102 mg/dL — ABNORMAL HIGH (ref 70–99)
Potassium: 3.6 mmol/L (ref 3.5–5.1)
Sodium: 138 mmol/L (ref 135–145)

## 2024-03-26 LAB — GLUCOSE, CAPILLARY
Glucose-Capillary: 248 mg/dL — ABNORMAL HIGH (ref 70–99)
Glucose-Capillary: 174 mg/dL — ABNORMAL HIGH (ref 70–99)
Glucose-Capillary: 180 mg/dL — ABNORMAL HIGH (ref 70–99)

## 2024-03-26 LAB — MAGNESIUM
Magnesium: 1.7 mg/dL (ref 1.7–2.4)
Magnesium: 2.4 mg/dL (ref 1.7–2.4)

## 2024-03-26 LAB — CBC
HCT: 42.3 % (ref 36.0–46.0)
Hemoglobin: 14.2 g/dL (ref 12.0–15.0)
MCH: 31.1 pg (ref 26.0–34.0)
MCHC: 33.6 g/dL (ref 30.0–36.0)
MCV: 92.8 fL (ref 80.0–100.0)
Platelets: 147 K/uL — ABNORMAL LOW (ref 150–400)
RBC: 4.56 MIL/uL (ref 3.87–5.11)
RDW: 15.2 % (ref 11.5–15.5)
WBC: 6.6 K/uL (ref 4.0–10.5)
nRBC: 0 % (ref 0.0–0.2)

## 2024-03-26 LAB — PHOSPHORUS: Phosphorus: 2 mg/dL — ABNORMAL LOW (ref 2.5–4.6)

## 2024-03-26 LAB — CBG MONITORING, ED
Glucose-Capillary: 112 mg/dL — ABNORMAL HIGH (ref 70–99)
Glucose-Capillary: 149 mg/dL — ABNORMAL HIGH (ref 70–99)
Glucose-Capillary: 164 mg/dL — ABNORMAL HIGH (ref 70–99)

## 2024-03-26 MED ORDER — LORAZEPAM 2 MG/ML IJ SOLN: 1.0000 mg | INTRAMUSCULAR | Status: DC | PRN

## 2024-03-26 MED ORDER — POTASSIUM PHOSPHATES 15 MMOLE/5ML IV SOLN
20.0000 mmol | Freq: Once | INTRAVENOUS | Status: AC
  Administered 2024-03-26: 20 mmol via INTRAVENOUS
  Filled 2024-03-26: qty 6.67

## 2024-03-26 MED ORDER — THIAMINE HCL 100 MG/ML IJ SOLN: 100.0000 mg | Freq: Every day | INTRAMUSCULAR | Status: DC

## 2024-03-26 MED ORDER — NICOTINE 14 MG/24HR TD PT24
14.0000 mg | MEDICATED_PATCH | Freq: Every day | TRANSDERMAL | Status: DC
  Administered 2024-03-26 – 2024-03-28 (×3): 14 mg via TRANSDERMAL
  Filled 2024-03-26 (×4): qty 1

## 2024-03-26 MED ORDER — POTASSIUM CHLORIDE 2 MEQ/ML IV SOLN: INTRAVENOUS | Status: DC

## 2024-03-26 MED ORDER — INSULIN GLARGINE-YFGN 100 UNIT/ML ~~LOC~~ SOLN
10.0000 [IU] | Freq: Every day | SUBCUTANEOUS | Status: DC
  Administered 2024-03-26 – 2024-03-27 (×2): 10 [IU] via SUBCUTANEOUS
  Filled 2024-03-26 (×3): qty 0.1

## 2024-03-26 MED ORDER — HYDROMORPHONE HCL 1 MG/ML IJ SOLN
0.5000 mg | INTRAMUSCULAR | Status: DC | PRN
  Administered 2024-03-26 – 2024-03-28 (×11): 1 mg via INTRAVENOUS
  Filled 2024-03-26 (×11): qty 1

## 2024-03-26 MED ORDER — LISINOPRIL 10 MG PO TABS
40.0000 mg | ORAL_TABLET | Freq: Every day | ORAL | Status: DC
  Administered 2024-03-26 – 2024-03-28 (×3): 40 mg via ORAL
  Filled 2024-03-26 (×3): qty 4

## 2024-03-26 MED ORDER — POTASSIUM CHLORIDE 2 MEQ/ML IV SOLN
INTRAVENOUS | Status: AC
  Filled 2024-03-26 (×3): qty 1000

## 2024-03-26 MED ORDER — MAGNESIUM SULFATE 2 GM/50ML IV SOLN
2.0000 g | Freq: Once | INTRAVENOUS | Status: AC
  Administered 2024-03-26: 2 g via INTRAVENOUS
  Filled 2024-03-26: qty 50

## 2024-03-26 MED ORDER — ADULT MULTIVITAMIN W/MINERALS CH
1.0000 | ORAL_TABLET | Freq: Every day | ORAL | Status: DC
  Administered 2024-03-26 – 2024-03-28 (×3): 1 via ORAL
  Filled 2024-03-26 (×3): qty 1

## 2024-03-26 MED ORDER — LORAZEPAM 1 MG PO TABS: 1.0000 mg | ORAL_TABLET | ORAL | Status: DC | PRN

## 2024-03-26 MED ORDER — POTASSIUM CHLORIDE 10 MEQ/100ML IV SOLN
10.0000 meq | INTRAVENOUS | Status: AC
  Administered 2024-03-26 (×3): 10 meq via INTRAVENOUS
  Filled 2024-03-26 (×3): qty 100

## 2024-03-26 MED ORDER — PANCRELIPASE (LIP-PROT-AMYL) 36000-114000 UNITS PO CPEP
72000.0000 [IU] | ORAL_CAPSULE | Freq: Three times a day (TID) | ORAL | Status: DC
  Administered 2024-03-26 – 2024-03-28 (×8): 72000 [IU] via ORAL
  Filled 2024-03-26 (×7): qty 2

## 2024-03-26 MED ORDER — BOOST / RESOURCE BREEZE PO LIQD CUSTOM
1.0000 | Freq: Three times a day (TID) | ORAL | Status: DC
  Administered 2024-03-26: 1 via ORAL
  Administered 2024-03-27 (×2): 237 mL via ORAL
  Administered 2024-03-28: 1 via ORAL

## 2024-03-26 MED ORDER — POTASSIUM CHLORIDE CRYS ER 20 MEQ PO TBCR
40.0000 meq | EXTENDED_RELEASE_TABLET | Freq: Once | ORAL | Status: AC
  Administered 2024-03-26: 40 meq via ORAL
  Filled 2024-03-26: qty 2

## 2024-03-26 MED ORDER — THIAMINE MONONITRATE 100 MG PO TABS
100.0000 mg | ORAL_TABLET | Freq: Every day | ORAL | Status: DC
  Administered 2024-03-26 – 2024-03-28 (×3): 100 mg via ORAL
  Filled 2024-03-26 (×3): qty 1

## 2024-03-26 MED ORDER — FOLIC ACID 1 MG PO TABS
1.0000 mg | ORAL_TABLET | Freq: Every day | ORAL | Status: DC
  Administered 2024-03-26 – 2024-03-28 (×3): 1 mg via ORAL
  Filled 2024-03-26 (×3): qty 1

## 2024-03-26 MED ORDER — OXYCODONE HCL 5 MG PO TABS
5.0000 mg | ORAL_TABLET | Freq: Four times a day (QID) | ORAL | Status: DC | PRN
  Administered 2024-03-26 – 2024-03-28 (×6): 5 mg via ORAL
  Filled 2024-03-26 (×7): qty 1

## 2024-03-26 NOTE — TOC CM/SW Note (Signed)
 Transition of Care [facility]) - Inpatient Brief Assessment   Patient Details  Name: Jillian Shepherd MRN: [id number] Date of Birth: 25-Aug-1985  Transition of Care [facility]) CM/SW Contact:    Noreen KATHEE Pinal, LCSWA Phone Number: 03/26/2024, 12:05 PM   Clinical Narrative:  CSW spoke with patient regarding consult for substance resources. Patient agreeable to CSW added resources to AVS. Also added resources for housing and smoking in AVS per red flag on SDOH.     Transition of Care Department [facility]) has reviewed patient and no TOC needs have been identified at this time. We will continue to monitor patient advancement through interdisciplinary progression rounds. If new patient transition needs arise, please place a TOC consult.  Transition of Care Asessment: Insurance and Status: Insurance coverage has been reviewed Patient has primary care physician: Yes Home environment has been reviewed: Single Family Home Prior level of function:: Independent Prior/Current Home Services: No current home services Social Drivers of Health Review: SDOH reviewed interventions complete (resources added to AVS) Readmission risk has been reviewed: Yes Transition of care needs: no transition of care needs at this time

## 2024-03-26 NOTE — Plan of Care (Signed)

## 2024-03-26 NOTE — Progress Notes (Signed)
 Initial Nutrition Assessment  DOCUMENTATION CODES:   Not applicable  INTERVENTION:   Boost Breeze po TID, each supplement provides 250 kcal and 9 grams of protein MVI with minerals daily Continue thiamine   NUTRITION DIAGNOSIS:   Inadequate oral intake related to altered GI function as evidenced by NPO status (NPO / clear liquid diet since admission).  GOAL:   Patient will meet greater than or equal to 90% of their needs  MONITOR:   PO intake, Supplement acceptance, Diet advancement  REASON FOR ASSESSMENT:   Malnutrition Screening Tool    ASSESSMENT:   38 yo female admitted with acute on chronic pancreatitis. PMH includes alcohol abuse, HTN, DM-2, collagen vascular disease, squamous cell skin cancer, Liddle syndrome, pancreatitis, B-12 deficiency.  Patient reports recent weight loss, a few pounds, d/t decreased PO intake for a few days PTA. Weight history reviewed. Current weight is above usual weight for the past 2 months. Currently on clear liquids. Not eating much.  Potassium and magnesium  low on admission, WNL today. Phosphorus low today. Has received mag sulfate, potassium chloride , and potassium phosphate  for repletion.  Labs reviewed. BUN 5, creat 0.34, phos 2 CBG: 149-112  Medications reviewed and include oscal, folic acid , novolog , semglee , creon , MVI with minerals, protonix , thiamine . IVF: LR with 40 mEq KCl at 125 ml/h  NUTRITION - FOCUSED PHYSICAL EXAM:  Flowsheet Row Most Recent Value  Orbital Region No depletion  Upper Arm Region No depletion  Thoracic and Lumbar Region No depletion  Buccal Region No depletion  Temple Region No depletion  Clavicle Bone Region No depletion  Clavicle and Acromion Bone Region No depletion  Scapular Bone Region No depletion  Dorsal Hand No depletion  Patellar Region No depletion  Anterior Thigh Region No depletion  Posterior Calf Region No depletion  Edema (RD Assessment) None  Hair Reviewed  Eyes Reviewed  Mouth  Reviewed  Skin Reviewed  Nails Reviewed    Diet Order:   Diet Order             Diet clear liquid Room service appropriate? Yes; Fluid consistency: Thin  Diet effective now                   EDUCATION NEEDS:   No education needs have been identified at this time  Skin:  Skin Assessment: Reviewed RN Assessment  Last BM:  8/19  Height:   Ht Readings from Last 1 Encounters:  03/26/24 5' 3 (1.6 m)   Weight:   Wt Readings from Last 1 Encounters:  03/26/24 77.1 kg   Ideal Body Weight:  52.3 kg  BMI:  Body mass index is 30.11 kg/m.  Estimated Nutritional Needs:   Kcal:  1850-2150  Protein:  85-105 gm  Fluid:  1.9-2.1 L   Suzen HUNT RD, LDN, CNSC Contact via secure chat. If unavailable, use group chat RD Inpatient.

## 2024-03-26 NOTE — Discharge Instructions (Addendum)
 Rent/Utilities/Housing Agency: Engineer, civil (consulting) Address: [address] [address] [place], [place] [address]  [address] [place], [place] [address]  Phone Number: [phone] or [phone] For the hearing-impaired - Dial 711 for [employer] Agency Name: Raynaldo Haws. Dept. of Health and Human Services Address: [address], [place], [place] [address] Phone: [phone] Website: [email or web address]  Services Offered: Temporary financial assistance, subsidized housing, and utility  assistance  Agency Name: [employer]  Address: [address] [place], [place] [address] Phone: [phone] ext. [phone] Email: Contact: info@newrha .org Website: FootballPromos.co.nz Services Offered: Subsidized apartment rent based on income.  Agency Name: [facility] Address: [facility], [address] [place], [place] Phone: [phone]  Website: www.ccmeden.org Services Offered: Museum/gallery curator, Water quality scientist for all of  [facility] [employer], [employer] , [employer]  July 12, 2020 [address] for [place] area only), rent assistance.  Agency Name: [facility] Address: [address] [place], [place], [place] [address] / [address],  [place] Phone: [phone] [place] / [phone] [place] Website: OpinionTrades.tn NetworkAffair.co.za Services Offered: Civil Service fast streamer, food, showers, hygiene products utility payment  assistance, thrift shops, rental assistance, Support Groups Agency Name: [facility]  Address: [address] [place], [place] [address]  Phone: [phone] / [phone] Website: https://www.[facility].org/ Services Offered: Support groups for Bipolar, substance abuse, anger  management, panic depression and anxiety. Mobile crisis unit,  outpatient therapy, substance abuse treatment. Agency Name: [employer]   Address: [address] [place], [place] [address]  Phone: [phone] Website: www.[facility]-centeragainstviolence.org Services Offered: Support groups for domestic violence or sexual assault, support  group for elderly women and domestic assault.    Additional Discharge Instructions   Please get your medications reviewed and adjusted by your Primary MD.  Please request your Primary MD to go over all Hospital Tests and Procedure/Radiological results at the follow up, please get all Hospital records sent to your Primary MD by signing hospital release before you go home.  If you had Pneumonia of Lung problems at the Hospital: Please get a 2 view Chest X ray done in approximately 4 weeks after hospital discharge or sooner if instructed by your Primary MD.  If you have Congestive Heart Failure: Please call your Cardiologist or Primary MD anytime you have any of the following symptoms:  1) 3 pound weight gain in 24 hours or 5 pounds in 1 week  2) shortness of breath, with or without a dry hacking cough  3) swelling in the hands, feet or stomach  4) if you have to sleep on extra pillows at night in order to breathe  Follow cardiac low salt diet and 1.5 lit/day fluid restriction.  If you have diabetes Accuchecks 4 times/day, Once in AM empty stomach and then before each meal. Log in all results and show them to your primary doctor at your next visit. If any glucose reading is under 80 or above 300 call your primary MD immediately.  If you have Seizure/Convulsions/Epilepsy: Please do not drive, operate heavy machinery, participate in activities at heights or participate in high speed sports until you have seen by Primary MD or a Neurologist and advised to do so again. Per [place]  DMV statutes, patients with seizures are not allowed to drive until they have been seizure-free for six months.  Use caution when using heavy equipment or power tools. Avoid working on ladders or  at heights. Take showers instead of baths. Ensure the water   temperature is not too high on the home water  heater. Do not go swimming alone. Do not lock yourself in a room alone (i.e. bathroom). When caring for infants or small children, sit down when holding, feeding, or changing them to minimize risk of injury to the child in the event you have a seizure. Maintain good sleep hygiene. Avoid alcohol.   If you had Gastrointestinal Bleeding: Please ask your Primary MD to check a complete blood count within one week of discharge or at your next visit. Your endoscopic/colonoscopic biopsies that are pending at the time of discharge, will also need to followed by your Primary MD.  Get Medicines reviewed and adjusted. Please take all your medications with you for your next visit with your Primary MD  Please request your Primary MD to go over all hospital tests and procedure/radiological results at the follow up, please ask your Primary MD to get all Hospital records sent to his/her office.  If you experience worsening of your admission symptoms, develop shortness of breath, life threatening emergency, suicidal or homicidal thoughts you must seek medical attention immediately by calling 911 or calling your MD immediately  if symptoms less severe.  You must read complete instructions/literature along with all the possible adverse reactions/side effects for all the Medicines you take and that have been prescribed to you. Take any new Medicines after you have completely understood and accpet all the possible adverse reactions/side effects.   Do not drive or operate heavy machinery when taking Pain medications.   Do not take more than prescribed Pain, Sleep and Anxiety Medications  Special Instructions: If you have smoked or chewed Tobacco  in the last 2 yrs please stop smoking, stop any regular Alcohol  and or any Recreational drug use.  Wear Seat belts while driving.  Please note You were cared for by a  hospitalist during your hospital stay. If you have any questions about your discharge medications or the care you received while you were in the hospital after you are discharged, you can call the unit and asked to speak with the hospitalist on call if the hospitalist that took care of you is not available. Once you are discharged, your primary care physician will handle any further medical issues. Please note that NO REFILLS for any discharge medications will be authorized once you are discharged, as it is imperative that you return to your primary care physician (or establish a relationship with a primary care physician if you do not have one) for your aftercare needs so that they can reassess your need for medications and monitor your lab values.  You can reach the hospitalist office at phone [phone] or fax [phone]   If you do not have a primary care physician, you can call [phone] for a physician referral.

## 2024-03-26 NOTE — ED Notes (Signed)
 Pt given ice chips

## 2024-03-26 NOTE — Progress Notes (Signed)
 Pt refused Lovenox  inj tonight

## 2024-03-26 NOTE — ED Notes (Signed)
 Pt ambulated to restroom.

## 2024-03-26 NOTE — TOC Initial Note (Signed)
 Transition of Care [facility]) - Initial/Assessment Note    Patient Details  Name: Jillian Shepherd MRN: [id number] Date of Birth: 1985/11/07  Transition of Care [facility]) CM/SW Contact:    Noreen KATHEE Pinal, LCSWA Phone Number: 03/26/2024, 12:11 PM  Clinical Narrative:                   Expected Discharge Plan: Home/Self Care Barriers to Discharge: Continued Medical Work up   Patient Goals and CMS Choice Patient states their goals for this hospitalization and ongoing recovery are:: return back home CMS Medicare.gov Compare Post Acute Care list provided to:: Patient        Expected Discharge Plan and Services In-house Referral: Clinical Social Work      Home with self-care    Prior Living Arrangements/Services   Lives with:: Minor Children, Spouse Patient language and need for interpreter reviewed:: Yes Do you feel safe going back to the place where you live?: Yes      Need for Family Participation in Patient Care: No (Comment) Care giver support system in place?: No (comment)   Criminal Activity/Legal Involvement Pertinent to Current Situation/Hospitalization: No - Comment as needed  Activities of Daily Living   Independent    Permission Sought/Granted      Share Information with NAME: Sanvi     Permission granted to share info w Relationship: Patient     Emotional Assessment Appearance:: Appears stated age Attitude/Demeanor/Rapport: Engaged Affect (typically observed): Accepting Orientation: : Oriented to Self, Oriented to Place, Oriented to  Time, Oriented to Situation Alcohol / Substance Use: Alcohol Use, Tobacco Use Psych Involvement: No (comment)  Admission diagnosis:  Acute on chronic pancreatitis (HCC) [K85.90, K86.1] Patient Active Problem List   Diagnosis Date Noted   Hypocalcemia 03/25/2024   Type 2 diabetes mellitus with hyperglycemia (HCC) 02/01/2024   UTI (urinary tract infection) 02/01/2024   GERD (gastroesophageal reflux disease) 04/05/2023    Elevated lipase 04/05/2023   Chronic alcohol use 04/05/2023   Pseudocyst of pancreas due to acute pancreatitis 04/05/2023   PICC line infection, initial encounter 11/07/2022   Abdominal pain, epigastric 08/19/2022   Acute recurrent pancreatitis 08/15/2022   Pancreatitis, necrotizing 08/15/2022   Cavernous transformation of portal vein 07/24/2022   Leukocytosis 07/21/2022   Chronic pancreatitis (HCC) 07/18/2022   Dehydration 07/07/2022   Diabetes mellitus type II, controlled (HCC) 07/01/2022   Acute on chronic pancreatitis 06/30/2022   Malnutrition of moderate degree 06/12/2022   Pancreatic pseudocyst 06/11/2022   Acute hypoxic respiratory failure (HCC) 06/10/2022   Pleural effusion on left 06/10/2022   Spleen hematoma 06/08/2022   Acute pancreatitis 05/31/2022   History of splenic vein thrombosis 05/31/2022   Liddle's syndrome 05/31/2022   Hypoxia 05/11/2022   Abdominal pain 05/10/2022   Nausea & vomiting 05/10/2022   Hypokalemia 05/10/2022   Hypomagnesemia 05/10/2022   Prolonged QT interval 05/10/2022   Hyperglycemia 05/10/2022   Alcohol abuse 05/10/2022   Acute alcoholic pancreatitis 05/10/2022   Diarrhea    Pancreatitis, acute 07/27/2020   Sacral back pain 09/14/2019   Bilateral ovarian cysts 09/02/2018   Elevated blood pressure reading without diagnosis of hypertension 01/20/2015   Essential hypertension 07/07/2014   Depression 07/07/2014   Obesity 07/07/2014   PCP:  Duanne Butler DASEN, MD Pharmacy:   [facility] [phone] - [place], [place] - [address] AT [facility] [address] [place] [address] Phone: [phone] Fax: [phone]     Social Drivers of Health (SDOH) Social History: SDOH Screenings   Food  Insecurity: No Food Insecurity (02/01/2024)  Housing: High Risk (02/01/2024)  Transportation Needs: No Transportation Needs (02/01/2024)  Utilities: Not At Risk (02/01/2024)  Depression (PHQ2-9): Low Risk  (01/16/2024)  Social Connections:  Socially Integrated (02/01/2024)  Tobacco Use: High Risk (03/25/2024)   SDOH Interventions:     Readmission Risk Interventions    03/26/2024   12:04 PM 02/01/2024    4:15 PM 08/21/2022    1:31 PM  Readmission Risk Prevention Plan  Post Dischage Appt  Complete   Medication Screening  Complete   Transportation Screening Complete Complete Complete  Home Care Screening Complete    Medication Review (RN CM) Complete    Medication Review (RN Futures trader)   Complete  PCP or Specialist appointment within 3-5 days of discharge   Not Complete  HRI or Home Care Consult   Complete  SW Recovery Care/Counseling Consult   Complete  Palliative Care Screening   Complete  Skilled Nursing Facility   Not Applicable

## 2024-03-26 NOTE — Progress Notes (Signed)
 PROGRESS NOTE   Jillian Shepherd  FMW:[id number]    DOB: 1986/01/04    DOA: 03/25/2024  PCP: Duanne Butler DASEN, MD   I have briefly reviewed patients previous medical records in [facility].   Brief Hospital Course:  38 year old female with medical history significant for Liddle syndrome, alcoholic chronic pancreatitis, recurrent pancreatitis with pseudocyst formation, recurrent admissions for same, alcohol use disorder, tobacco abuse, type II DM, HTN, presented to the ED on 8/20 with complaints of 3 to 4 days history of subacute onset of upper abdominal pain with radiation to back, nausea, nonbloody vomiting and inability to keep much down, all of which started after consuming 16 ounces of malt liquor on 8/17 and 8/18.  Per report, symptoms similar to prior episodes of pancreatitis.  CT A/P with contrast showed mild peripancreatic stranding consistent with pancreatitis.  Admitted for acute on chronic pancreatitis, hypokalemia and hypomagnesemia complicating ongoing alcohol use   Assessment & Plan:   Acute on chronic alcoholic pancreatitis with pseudocyst, recurrent pancreatitis Current episode likely precipitated by alcohol use Absolute alcohol abstinence counseled Lipase 38 CT A/P with contrast: Mild peripancreatic stranding consistent with pancreatitis, slightly improved inflammatory changes compared with June exam.  Chronic pseudocyst in the distal pancreas, measuring up to 2.7 cm, grossly stable. RUQ ultrasound: Cholelithiasis or changes of acute cholecystitis.  Hepatic steatosis, also seen on CT A/P. Treating supportively with bowel rest, continue clear liquids and advance diet gradually as tolerated, IV fluids, multimodality pain management, Creon  supplements, IV PPI and as needed antiemetics. States that she follows up with a Solicitor at [employer] in [place], continue.  Hypokalemia Potassium 2.9 > 2.8.  Replacing aggressively and follow BMP at noon and  address as needed  Hypomagnesemia Magnesium  1.3 > 1.7.  Continue to replace and follow repeat labs at noon and address appropriately  Hypophosphatemia Replace and follow.  Thrombocytopenia Suspected due to alcohol use.  Follow CBC in a.m.  Tobacco abuse Cessation counseled.  Nicotine  patch  Alcohol use disorder Does not appear to be drinking alcohol daily.  However will place on CIWA protocol.  Poorly controlled type II DM A1c 12.5 on 01/15/2014.?  Worsened by chronic pancreatitis Continue Lantus  10 units at bedtime and SSI.  Adjust insulins as needed  Essential hypertension Controlled on lisinopril  40 mg daily, continue  GERD IV PPI  There is no height or weight on file to calculate BMI.   DVT prophylaxis: enoxaparin  (LOVENOX ) injection 40 mg Start: 03/25/24 2200 SCDs Start: 03/25/24 2157     Code Status: Full Code:  Family Communication: None at bedside Disposition:  Status is: Inpatient Remains inpatient appropriate because: Acute pancreatitis with ongoing pain, needs IV fluids, gradual advancement of diet, electrolyte replacement.  Suspect will need 2 to 3 days of hospitalization     Consultants:   None  Procedures:     Subjective:  Seen this morning while still in ED.  Intermittent nausea but no vomiting since hospital admission.  Had a normal soft BM yesterday.  Volunteers to smoking 10 cigarettes/day and agreeable to nicotine  patch.  Last drank 16 ounces of malt liquor on 8/17 and 18.  States that she has severe upper abdominal pain radiating to the back not controlled by current pain regimen.  Objective:   Vitals:   03/26/24 0630 03/26/24 0645 03/26/24 0700 03/26/24 0800  BP:   117/67 125/87  Pulse: 83 84 81 81  Resp: 14 16 17  (!) 21  Temp:      TempSrc:  SpO2: 97% 95% 95% 94%    General exam: Young female, moderately built and nourished lying uncomfortably propped up in bed without distress.  Oral mucosa with borderline hydration. Respiratory  system: Clear to auscultation. Respiratory effort normal. Cardiovascular system: S1 & S2 heard, RRR. No JVD, murmurs, rubs, gallops or clicks. No pedal edema.  Telemetry personally reviewed at bedside: Sinus rhythm.. Gastrointestinal system: Abdomen is nondistended, soft epigastric and mid abdominal tenderness with voluntary guarding but no rigidity, rebound.  Normal bowel sounds heard.  No organomegaly or masses appreciated. Central nervous system: Alert and oriented. No focal neurological deficits. Extremities: Symmetric 5 x 5 power. Skin: No rashes, lesions or ulcers Psychiatry: Judgement and insight appear normal. Mood & affect appropriate.     Data Reviewed:   I have personally reviewed following labs and imaging studies   CBC: Recent Labs  Lab 03/25/24 1747 03/26/24 0250  WBC 8.4 6.6  HGB 16.5* 14.2  HCT 49.0* 42.3  MCV 91.6 92.8  PLT 178 147*    Basic Metabolic Panel: Recent Labs  Lab 03/25/24 1747 03/25/24 1847 03/26/24 0250  NA 136  --  137  K 2.9*  --  2.8*  CL 92*  --  103  CO2 28  --  24  GLUCOSE 252*  --  152*  BUN 8  --  6  CREATININE 0.58  --  0.38*  CALCIUM  8.8*  --  7.2*  MG  --  1.3* 1.7  PHOS  --   --  2.0*    Liver Function Tests: Recent Labs  Lab 03/25/24 1747 03/26/24 0250  AST 29 23  ALT 13 11  ALKPHOS 135* 97  BILITOT 0.9 0.7  PROT 7.6 5.8*  ALBUMIN 3.8 2.8*    CBG: Recent Labs  Lab 03/26/24 0021 03/26/24 0752  GLUCAP 164* 149*    Microbiology Studies:  No results found for this or any previous visit (from the past 240 hours).  Radiology Studies:  CT ABDOMEN PELVIS W CONTRAST Result Date: 03/25/2024 CLINICAL DATA:  Abdomen pain EXAM: CT ABDOMEN AND PELVIS WITH CONTRAST TECHNIQUE: Multidetector CT imaging of the abdomen and pelvis was performed using the standard protocol following bolus administration of intravenous contrast. RADIATION DOSE REDUCTION: This exam was performed according to the departmental dose-optimization  program which includes automated exposure control, adjustment of the mA and/or kV according to patient size and/or use of iterative reconstruction technique. CONTRAST:  OMNIPAQUE  IOHEXOL  300 MG/ML  SOLN COMPARISON:  Ultrasound and CT 02/01/2024, 12/09/2023, 04/05/2023 and exams dating back to 2023 FINDINGS: Lower chest: Lung bases demonstrate no acute airspace disease. Hepatobiliary: Geographic hypodensities within the left hepatic lobe presumably representing steatosis. No calcified gallstone or biliary dilatation Pancreas: Dystrophic calcification in the distal pancreas. Mild peripancreatic stranding consistent with pancreatitis, slightly improved compared with June examination. Chronic pseudocysts in the distal pancreas, the larger more distal lesion measures 2.7 cm compared with 2.5 cm previously. The more medial lesion measures 20 mm compared with 22 mm previously. Spleen: Chronic subcapsular fluid collection measuring approximately 9.7 x 6.6 cm, previously 10 x 6.9 cm. Spleen otherwise negative Adrenals/Urinary Tract: Adrenal glands are normal. Kidneys show no hydronephrosis. Bladder demonstrates mild diffuse thick wall but is under distended Stomach/Bowel: Stomach within normal limits. No dilated small bowel. No acute bowel wall thickening. Negative appendix Vascular/Lymphatic: Nonaneurysmal aorta. No suspicious lymph nodes. 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 Reproductive: Uterus and bilateral adnexa are unremarkable. Other:  Negative for pelvic effusion or free air Musculoskeletal: No acute or suspicious osseous abnormality IMPRESSION: 1. Mild peripancreatic stranding consistent with pancreatitis, slightly improved inflammatory changes compared with June examination. Chronic pseudocysts in the distal pancreas, measuring up to 2.7 cm, grossly stable. 2. Chronic splenic subcapsular fluid collection, slightly decreased in size compared  with June examination. 3. Other chronic findings as discussed above. 4. Geographic hypodensities within the left hepatic lobe presumably representing steatosis. Electronically Signed   By: Luke Bun M.D.   On: 03/25/2024 20:21    Scheduled Meds:    calcium  carbonate  1 tablet Oral Q breakfast   enoxaparin  (LOVENOX ) injection  40 mg Subcutaneous Q24H   insulin  aspart  0-15 Units Subcutaneous TID WC   insulin  aspart  0-5 Units Subcutaneous QHS   insulin  glargine  10 Units Subcutaneous QHS   lipase/protease/amylase  72,000 Units Oral TID WC   lisinopril   40 mg Oral Daily   pantoprazole  (PROTONIX ) IV  40 mg Intravenous Q24H    Continuous Infusions:     LOS: 1 day     Trenda Mar, MD,  FACP, [facility], [facility], [facility]   [facility] [place]      To contact the attending provider between 7A-7P or the covering provider during after hours 7P-7A, please log into the web site [email or web address] and access using universal [place] password for that web site. If you do not have the password, please call the hospital operator.  03/26/2024, 8:54 AM

## 2024-03-27 DIAGNOSIS — K859 Acute pancreatitis without necrosis or infection, unspecified: Secondary | ICD-10-CM | POA: Diagnosis not present

## 2024-03-27 DIAGNOSIS — K861 Other chronic pancreatitis: Secondary | ICD-10-CM | POA: Diagnosis not present

## 2024-03-27 LAB — CBC
HCT: 44.8 % (ref 36.0–46.0)
Hemoglobin: 14.6 g/dL (ref 12.0–15.0)
MCH: 31.8 pg (ref 26.0–34.0)
MCHC: 32.6 g/dL (ref 30.0–36.0)
MCV: 97.6 fL (ref 80.0–100.0)
Platelets: 129 K/uL — ABNORMAL LOW (ref 150–400)
RBC: 4.59 MIL/uL (ref 3.87–5.11)
RDW: 15.8 % — ABNORMAL HIGH (ref 11.5–15.5)
WBC: 5.4 K/uL (ref 4.0–10.5)
nRBC: 0 % (ref 0.0–0.2)

## 2024-03-27 LAB — COMPREHENSIVE METABOLIC PANEL WITH GFR
ALT: 16 U/L (ref 0–44)
AST: 42 U/L — ABNORMAL HIGH (ref 15–41)
Albumin: 2.7 g/dL — ABNORMAL LOW (ref 3.5–5.0)
Alkaline Phosphatase: 85 U/L (ref 38–126)
Anion gap: 8 (ref 5–15)
BUN: <5 mg/dL — ABNORMAL LOW (ref 6–20)
CO2: 22 mmol/L (ref 22–32)
Calcium: 7.8 mg/dL — ABNORMAL LOW (ref 8.9–10.3)
Chloride: 105 mmol/L (ref 98–111)
Creatinine, Ser: 0.3 mg/dL — ABNORMAL LOW (ref 0.44–1.00)
GFR, Estimated: >60 mL/min (ref >60)
Glucose, Bld: 156 mg/dL — ABNORMAL HIGH (ref 70–99)
Potassium: 3.8 mmol/L (ref 3.5–5.1)
Sodium: 135 mmol/L (ref 135–145)
Total Bilirubin: 0.4 mg/dL (ref 0.0–1.2)
Total Protein: 5.9 g/dL — ABNORMAL LOW (ref 6.5–8.1)

## 2024-03-27 LAB — GLUCOSE, CAPILLARY
Glucose-Capillary: 151 mg/dL — ABNORMAL HIGH (ref 70–99)
Glucose-Capillary: 141 mg/dL — ABNORMAL HIGH (ref 70–99)
Glucose-Capillary: 107 mg/dL — ABNORMAL HIGH (ref 70–99)
Glucose-Capillary: 130 mg/dL — ABNORMAL HIGH (ref 70–99)

## 2024-03-27 LAB — MAGNESIUM: Magnesium: 2 mg/dL (ref 1.7–2.4)

## 2024-03-27 LAB — PHOSPHORUS: Phosphorus: 2.7 mg/dL (ref 2.5–4.6)

## 2024-03-27 MED ORDER — POTASSIUM CHLORIDE 2 MEQ/ML IV SOLN
INTRAVENOUS | Status: AC
  Filled 2024-03-27 (×5): qty 1000

## 2024-03-27 NOTE — Progress Notes (Addendum)
 Richardean, RN notified this nurse that patients left arm was swollen, red and pink at IV site post Potassium Phosphate  infusion. Richardean, RN removed PIV with catheter intact and assessed site. This nurse notified [facility] and spoke with Levorn Gaskins, pharmacist that instructed to mark swelling, apply cool/warm compress and notify provider of any additional swelling, pain or redness. All precautions given by pharmacist were completed by Richardean, RN and patient instructed to notify nurse of any new symptoms.

## 2024-03-27 NOTE — Progress Notes (Signed)
 PROGRESS NOTE   Jillian Shepherd  FMW:[id number]    DOB: 1986/01/18    DOA: 03/25/2024  PCP: Duanne Butler DASEN, MD   I have briefly reviewed patients previous medical records in [facility].   Brief Hospital Course:  38 year old female with medical history significant for Liddle syndrome, alcoholic chronic pancreatitis, recurrent pancreatitis with pseudocyst formation, recurrent admissions for same, alcohol use disorder, tobacco abuse, type II DM, HTN, presented to the ED on 8/20 with complaints of 3 to 4 days history of subacute onset of upper abdominal pain with radiation to back, nausea, nonbloody vomiting and inability to keep much down, all of which started after consuming 16 ounces of malt liquor on 8/17 and 8/18.  Per report, symptoms similar to prior episodes of pancreatitis.  CT A/P with contrast showed mild peripancreatic stranding consistent with pancreatitis.  Admitted for acute on chronic pancreatitis, hypokalemia and hypomagnesemia complicating ongoing alcohol use improving.  Advancing to full liquids today.   Assessment & Plan:   Acute on chronic alcoholic pancreatitis with pseudocyst, recurrent pancreatitis Current episode likely precipitated by alcohol use Absolute alcohol abstinence counseled Lipase 38 CT A/P with contrast: Mild peripancreatic stranding consistent with pancreatitis, slightly improved inflammatory changes compared with June exam.  Chronic pseudocyst in the distal pancreas, measuring up to 2.7 cm, grossly stable. RUQ ultrasound: Cholelithiasis or changes of acute cholecystitis.  Hepatic steatosis, also seen on CT A/P. Treating supportively with bowel rest, advance diet gradually as tolerated, IV fluids, multimodality pain management, Creon  supplements, IV PPI and as needed antiemetics. States that she follows up with a Solicitor at [employer] in [place], continue. Tolerating clear liquids, advance to full liquids today and if does well  then possible soft diet tomorrow and DC.  Hypokalemia Replaced.  Hypomagnesemia Replaced.  Hypophosphatemia Replaced.  Thrombocytopenia Suspected due to alcohol use.  Continue to monitor daily CBCs.  Tobacco abuse Cessation counseled.  Nicotine  patch  Alcohol use disorder Does not appear to be drinking alcohol daily.  On CIWA protocol but no overt withdrawal.  Poorly controlled type II DM A1c 12.5 on 01/15/2014.?  Worsened by chronic pancreatitis Continue Lantus  10 units at bedtime and SSI.  Adjust insulins as needed Reasonable inpatient control.  Essential hypertension Controlled on lisinopril  40 mg daily, continue  GERD IV PPI  Right upper extremity suspected IV infiltration Does not look very acute.  Elevate limb.  Body mass index is 30.11 kg/m.   DVT prophylaxis: enoxaparin  (LOVENOX ) injection 40 mg Start: 03/25/24 2200 SCDs Start: 03/25/24 2157     Code Status: Full Code:  Family Communication: None at bedside Disposition:  Slowly advancing diet, ongoing pain abdomen but better, pending further clinical improvement and stability, possible DC home tomorrow.     Consultants:   None  Procedures:     Subjective:  Abdominal pain better, rates it as 8/10 in severity, tolerating clear liquid and wants to proceed to full liquid and not soft diet (do not want to push it).  Passing lots of flatus.  No BM.  No nausea or vomiting.  Mid right upper extremity with swelling and faint redness following IV meds, possible infiltration, no significant pain.  Objective:   Vitals:   03/26/24 1346 03/26/24 2034 03/27/24 0450 03/27/24 0837  BP: 106/74 (!) 128/96 (!) 124/91 (!) 132/90  Pulse: 84 85 80 73  Resp: 18 18 18 20   Temp: 97.8 F (36.6 C) 99.4 F (37.4 C) 98.2 F (36.8 C) 97.7 F (36.5 C)  TempSrc: Oral  Oral Oral Oral  SpO2: 95% 97% 96% 98%  Weight: 77.1 kg     Height: 5' 3 (1.6 m)       General exam: Young female, moderately built and nourished lying  uncomfortably propped up in bed without distress.  Oral mucosa moist Respiratory system: Clear to auscultation. Respiratory effort normal. Cardiovascular system: S1 & S2 heard, RRR. No JVD, murmurs, rubs, gallops or clicks. No pedal edema.  Telemetry personally reviewed at bedside: Sinus rhythm.  Discontinued telemetry. Gastrointestinal system: Abdomen is nondistended, soft epigastric and mid abdominal tenderness with voluntary guarding but no rigidity, rebound.  Normal bowel sounds heard.  No organomegaly or masses appreciated. Central nervous system: Alert and oriented. No focal neurological deficits. Extremities: Symmetric 5 x 5 power. Skin: No rashes, lesions or ulcers Psychiatry: Judgement and insight appear normal. Mood & affect appropriate.     Data Reviewed:   I have personally reviewed following labs and imaging studies   CBC: Recent Labs  Lab 03/25/24 1747 03/26/24 0250 03/27/24 0448  WBC 8.4 6.6 5.4  HGB 16.5* 14.2 14.6  HCT 49.0* 42.3 44.8  MCV 91.6 92.8 97.6  PLT 178 147* 129*    Basic Metabolic Panel: Recent Labs  Lab 03/25/24 1747 03/25/24 1847 03/26/24 0250 03/26/24 1115 03/27/24 0448  NA 136  --  137 138 135  K 2.9*  --  2.8* 3.6 3.8  CL 92*  --  103 107 105  CO2 28  --  24 21* 22  GLUCOSE 252*  --  152* 102* 156*  BUN 8  --  6 5* <5*  CREATININE 0.58  --  0.38* 0.34* 0.30*  CALCIUM  8.8*  --  7.2* 7.2* 7.8*  MG  --  1.3* 1.7 2.4 2.0  PHOS  --   --  2.0*  --  2.7    Liver Function Tests: Recent Labs  Lab 03/25/24 1747 03/26/24 0250 03/27/24 0448  AST 29 23 42*  ALT 13 11 16   ALKPHOS 135* 97 85  BILITOT 0.9 0.7 0.4  PROT 7.6 5.8* 5.9*  ALBUMIN 3.8 2.8* 2.7*    CBG: Recent Labs  Lab 03/26/24 2039 03/26/24 2128 03/27/24 0758  GLUCAP 180* 174* 151*    Microbiology Studies:  No results found for this or any previous visit (from the past 240 hours).  Radiology Studies:  US  Abdomen Limited Result Date: 03/26/2024 CLINICAL DATA:   [id number] Acute on chronic pancreatitis (HCC) [id number] EXAM: ULTRASOUND ABDOMEN LIMITED RIGHT UPPER QUADRANT COMPARISON:  03/25/2024 FINDINGS: Gallbladder: No gallstones. No wall thickening or pericholecystic fluid. No sonographic Murphy's sign noted by sonographer. Common bile duct: Diameter: 5 mm Liver: Patchy regions of increased parenchymal echogenicity. No focal lesion identified. No intrahepatic biliary ductal dilation. Portal vein is patent on color Doppler imaging with normal direction of blood flow towards the liver. Right Kidney: Partially visualized. No mass. No hydronephrosis or nephrolithiasis. Other: None. IMPRESSION: 1. No cholecystolithiasis or changes of acute cholecystitis. 2. Geographic hepatic steatosis. Electronically Signed   By: Rogelia Myers M.D.   On: 03/26/2024 08:52   CT ABDOMEN PELVIS W CONTRAST Result Date: 03/25/2024 CLINICAL DATA:  Abdomen pain EXAM: CT ABDOMEN AND PELVIS WITH CONTRAST TECHNIQUE: Multidetector CT imaging of the abdomen and pelvis was performed using the standard protocol following bolus administration of intravenous contrast. RADIATION DOSE REDUCTION: This exam was performed according to the departmental dose-optimization program which includes automated exposure control, adjustment of the mA and/or kV according to patient size and/or use of  iterative reconstruction technique. CONTRAST:  OMNIPAQUE  IOHEXOL  300 MG/ML  SOLN COMPARISON:  Ultrasound and CT 02/01/2024, 12/09/2023, 04/05/2023 and exams dating back to 2023 FINDINGS: Lower chest: Lung bases demonstrate no acute airspace disease. Hepatobiliary: Geographic hypodensities within the left hepatic lobe presumably representing steatosis. No calcified gallstone or biliary dilatation Pancreas: Dystrophic calcification in the distal pancreas. Mild peripancreatic stranding consistent with pancreatitis, slightly improved compared with June examination. Chronic pseudocysts in the distal pancreas, the larger more  distal lesion measures 2.7 cm compared with 2.5 cm previously. The more medial lesion measures 20 mm compared with 22 mm previously. Spleen: Chronic subcapsular fluid collection measuring approximately 9.7 x 6.6 cm, previously 10 x 6.9 cm. Spleen otherwise negative Adrenals/Urinary Tract: Adrenal glands are normal. Kidneys show no hydronephrosis. Bladder demonstrates mild diffuse thick wall but is under distended Stomach/Bowel: Stomach within normal limits. No dilated small bowel. No acute bowel wall thickening. Negative appendix Vascular/Lymphatic: Nonaneurysmal aorta. No suspicious lymph nodes. 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 Reproductive: Uterus and bilateral adnexa are unremarkable. Other: Negative for pelvic effusion or free air Musculoskeletal: No acute or suspicious osseous abnormality IMPRESSION: 1. Mild peripancreatic stranding consistent with pancreatitis, slightly improved inflammatory changes compared with June examination. Chronic pseudocysts in the distal pancreas, measuring up to 2.7 cm, grossly stable. 2. Chronic splenic subcapsular fluid collection, slightly decreased in size compared with June examination. 3. Other chronic findings as discussed above. 4. Geographic hypodensities within the left hepatic lobe presumably representing steatosis. Electronically Signed   By: Luke Bun M.D.   On: 03/25/2024 20:21    Scheduled Meds:    calcium  carbonate  1 tablet Oral Q breakfast   enoxaparin  (LOVENOX ) injection  40 mg Subcutaneous Q24H   feeding supplement  1 Container Oral TID BM   folic acid   1 mg Oral Daily   insulin  aspart  0-15 Units Subcutaneous TID WC   insulin  aspart  0-5 Units Subcutaneous QHS   insulin  glargine-yfgn  10 Units Subcutaneous QHS   lipase/protease/amylase  72,000 Units Oral TID WC   lisinopril   40 mg Oral Daily   multivitamin with minerals  1 tablet Oral Daily   nicotine   14 mg Transdermal  Daily   pantoprazole  (PROTONIX ) IV  40 mg Intravenous Q24H   thiamine   100 mg Oral Daily   Or   thiamine   100 mg Intravenous Daily    Continuous Infusions:     LOS: 2 days     Trenda Mar, MD,  FACP, [facility], [facility], [facility]   [facility] [place]      To contact the attending provider between 7A-7P or the covering provider during after hours 7P-7A, please log into the web site [email or web address] and access using universal [place] password for that web site. If you do not have the password, please call the hospital operator.  03/27/2024, 11:30 AM

## 2024-03-27 NOTE — Progress Notes (Signed)
 R AC PIV site edematous, pink, and tender. No IVF or IVPB infusing at this time, PIV was saline locked. Last IV medication was potassium phosphate  that was completed at 0217 with no complications. Pharmacy to be notified this am, PIV removed, site assessed, cold/hot therapy applied, arm elevated, call bell in reach, will monitor

## 2024-03-27 NOTE — Plan of Care (Signed)

## 2024-03-28 DIAGNOSIS — K859 Acute pancreatitis without necrosis or infection, unspecified: Secondary | ICD-10-CM | POA: Diagnosis not present

## 2024-03-28 DIAGNOSIS — K861 Other chronic pancreatitis: Secondary | ICD-10-CM | POA: Diagnosis not present

## 2024-03-28 LAB — MAGNESIUM: Magnesium: 1.7 mg/dL (ref 1.7–2.4)

## 2024-03-28 LAB — COMPREHENSIVE METABOLIC PANEL WITH GFR
ALT: 14 U/L (ref 0–44)
AST: 33 U/L (ref 15–41)
Albumin: 2.6 g/dL — ABNORMAL LOW (ref 3.5–5.0)
Alkaline Phosphatase: 75 U/L (ref 38–126)
Anion gap: 7 (ref 5–15)
BUN: <5 mg/dL — ABNORMAL LOW (ref 6–20)
CO2: 27 mmol/L (ref 22–32)
Calcium: 8.2 mg/dL — ABNORMAL LOW (ref 8.9–10.3)
Chloride: 104 mmol/L (ref 98–111)
Creatinine, Ser: 0.34 mg/dL — ABNORMAL LOW (ref 0.44–1.00)
GFR, Estimated: >60 mL/min (ref >60)
Glucose, Bld: 75 mg/dL (ref 70–99)
Potassium: 3.4 mmol/L — ABNORMAL LOW (ref 3.5–5.1)
Sodium: 138 mmol/L (ref 135–145)
Total Bilirubin: 0.5 mg/dL (ref 0.0–1.2)
Total Protein: 5.6 g/dL — ABNORMAL LOW (ref 6.5–8.1)

## 2024-03-28 LAB — CBC
HCT: 45 % (ref 36.0–46.0)
Hemoglobin: 14.9 g/dL (ref 12.0–15.0)
MCH: 31.3 pg (ref 26.0–34.0)
MCHC: 33.1 g/dL (ref 30.0–36.0)
MCV: 94.5 fL (ref 80.0–100.0)
Platelets: 125 K/uL — ABNORMAL LOW (ref 150–400)
RBC: 4.76 MIL/uL (ref 3.87–5.11)
RDW: 15.3 % (ref 11.5–15.5)
WBC: 4.8 K/uL (ref 4.0–10.5)
nRBC: 0 % (ref 0.0–0.2)

## 2024-03-28 LAB — GLUCOSE, CAPILLARY
Glucose-Capillary: 87 mg/dL (ref 70–99)
Glucose-Capillary: 112 mg/dL — ABNORMAL HIGH (ref 70–99)

## 2024-03-28 LAB — PHOSPHORUS: Phosphorus: 3 mg/dL (ref 2.5–4.6)

## 2024-03-28 MED ORDER — VITAMIN B-1 100 MG PO TABS: 100.0000 mg | ORAL_TABLET | Freq: Every day | ORAL | 1 refills | Status: DC

## 2024-03-28 MED ORDER — POTASSIUM CHLORIDE CRYS ER 20 MEQ PO TBCR
40.0000 meq | EXTENDED_RELEASE_TABLET | Freq: Once | ORAL | Status: AC
  Administered 2024-03-28: 40 meq via ORAL
  Filled 2024-03-28: qty 2

## 2024-03-28 MED ORDER — NICOTINE 14 MG/24HR TD PT24: 14.0000 mg | MEDICATED_PATCH | Freq: Every day | TRANSDERMAL | 0 refills | Status: DC

## 2024-03-28 MED ORDER — OXYCODONE HCL 5 MG PO TABS: 5.0000 mg | ORAL_TABLET | Freq: Four times a day (QID) | ORAL | 0 refills | Status: DC | PRN

## 2024-03-28 MED ORDER — DOCUSATE SODIUM 100 MG PO CAPS: 100.0000 mg | ORAL_CAPSULE | Freq: Two times a day (BID) | ORAL | 0 refills | Status: DC

## 2024-03-28 MED ORDER — FOLIC ACID 1 MG PO TABS: 1.0000 mg | ORAL_TABLET | Freq: Every day | ORAL | 1 refills | Status: DC

## 2024-03-28 MED ORDER — ADULT MULTIVITAMIN W/MINERALS CH: 1.0000 | ORAL_TABLET | Freq: Every day | ORAL | Status: DC

## 2024-03-28 MED ORDER — MAGNESIUM SULFATE 2 GM/50ML IV SOLN
2.0000 g | Freq: Once | INTRAVENOUS | Status: AC
  Administered 2024-03-28: 2 g via INTRAVENOUS
  Filled 2024-03-28: qty 50

## 2024-03-28 MED ORDER — DOCUSATE SODIUM 100 MG PO CAPS
100.0000 mg | ORAL_CAPSULE | Freq: Two times a day (BID) | ORAL | Status: DC
  Administered 2024-03-28: 100 mg via ORAL
  Filled 2024-03-28: qty 1

## 2024-03-28 NOTE — Discharge Summary (Signed)
 Physician Discharge Summary  Jillian Shepherd FMW:[id number] DOB: [birth year]  PCP: Duanne Butler DASEN, MD  Admitted from: Home Discharged to: Home  Admit date: 03/25/2024 Discharge date: 03/28/2024  Recommendations for Outpatient Follow-up:    Follow-up Information     Duanne Butler DASEN, MD. Schedule an appointment as soon as possible for a visit in 1 week(s).   Specialty: Family Medicine Why: To be seen with repeat labs (CBC, CMP and magnesium ). Contact information: [address] [place] [address] [phone]         Alois Teena LABOR, MD. Schedule an appointment as soon as possible for a visit.   Specialty: Gastroenterology Contact information: [address] [place] [address] [phone]                  Home Health: None    Equipment/Devices: None    Discharge Condition: Improved and stable.     Code Status: Full Code Diet recommendation:  Discharge Diet Orders (From admission, onward)     Start     Ordered   03/28/24 0000  Diet - low sodium heart healthy        03/28/24 1321   03/28/24 0000  Diet Carb Modified        03/28/24 1321             Discharge Diagnoses:  Principal Problem:   Acute on chronic pancreatitis Active Problems:   Alcohol abuse   Nausea & vomiting   Essential hypertension   Abdominal pain   GERD (gastroesophageal reflux disease)   Type 2 diabetes mellitus with hyperglycemia (HCC)   Hypocalcemia   Brief Hospital Course:  38 year old female with medical history significant for Liddle syndrome, alcoholic chronic pancreatitis, recurrent pancreatitis with pseudocyst formation, recurrent admissions for same, alcohol use disorder, tobacco abuse, type II DM, HTN, presented to the ED on 8/20 with complaints of 3 to 4 days history of subacute onset of upper abdominal pain with radiation to back, nausea, nonbloody vomiting and inability to keep much down, all of which started after consuming 16 ounces of  malt liquor on 8/17 and 8/18.  Per report, symptoms similar to prior episodes of pancreatitis.  CT A/P with contrast showed mild peripancreatic stranding consistent with pancreatitis.   Admitted for acute on chronic pancreatitis, hypokalemia and hypomagnesemia complicating ongoing alcohol use improving.       Assessment & Plan:    Acute on chronic alcoholic pancreatitis with pseudocyst, recurrent pancreatitis Current episode likely precipitated by alcohol use Absolute alcohol abstinence counseled Lipase 38.  Urine pregnancy test negative x 2 on 03/25/2024. CT A/P with contrast: Mild peripancreatic stranding consistent with pancreatitis, slightly improved inflammatory changes compared with June exam.  Chronic pseudocyst in the distal pancreas, measuring up to 2.7 cm, grossly stable. RUQ ultrasound: No cholelithiasis or changes of acute cholecystitis.  Hepatic steatosis, also seen on CT A/P. Treated supportively with bowel rest, advance diet gradually as tolerated, IV fluids, multimodality pain management, Creon  supplements, IV PPI and as needed antiemetics. States that she follows up with a Solicitor at [employer] in [place], continue. Patient has clinically improved, tolerated soft diet, pain has improved requiring less pain meds.   Hypokalemia Replaced on day of discharge.  Outpatient follow-up with repeat BMP.   Hypomagnesemia Magnesium  low normal at 1.7 on day of discharge, replaced with IV magnesium  x 2 g.  Outpatient follow-up.   Hypophosphatemia Replaced.   Thrombocytopenia Suspected due to alcohol use.  Stable.  Outpatient  follow-up with repeat CBCs.   Tobacco abuse Cessation counseled.  Nicotine  patch   Alcohol use disorder Does not appear to be drinking alcohol daily.  No withdrawal through course of hospital admission.   Poorly controlled type II DM A1c 12.5 on 01/16/2024 which was even higher, >14 on 08/15/2023.?  Worsened by chronic pancreatitis In the  hospital, she was treated with low-dose Semglee  and SSI with good control. Continue prior home medication regimen as below.  Close outpatient follow-up with PCP for further management and better control of her DM.   Essential hypertension Controlled on lisinopril  40 mg daily, continue   GERD Continue PPI.   Right upper extremity suspected IV infiltration Does not look very acute.  Elevate limb.  Improved.   Body mass index is 30.11 kg/m./Class I obesity Complicates care.  Outpatient follow-up.    Consultants:   None   Procedures:       Discharge Instructions  Discharge Instructions     Call MD for:  difficulty breathing, headache or visual disturbances   Complete by: As directed    Call MD for:  extreme fatigue   Complete by: As directed    Call MD for:  persistant dizziness or light-headedness   Complete by: As directed    Call MD for:  persistant nausea and vomiting   Complete by: As directed    Call MD for:  severe uncontrolled pain   Complete by: As directed    Call MD for:  temperature >100.4   Complete by: As directed    Diet - low sodium heart healthy   Complete by: As directed    Diet Carb Modified   Complete by: As directed    Increase activity slowly   Complete by: As directed         Medication List     STOP taking these medications    amitriptyline 50 MG tablet Commonly known as: ELAVIL       TAKE these medications    Accu-Chek Guide Test test strip Generic drug: glucose blood USE IN THE MORNING, AT NOON, AND AT BEDTIME. MAY SUBSTITUTE TO ANY MANUFACTURER   Accu-Chek Softclix Lancets lancets USE IN THE MORNING, AT NOON, AND AT BEDTIME. MAY SUBSTITUTE   acetaminophen  500 MG tablet Commonly known as: TYLENOL  Take 1,000 mg by mouth every 6 (six) hours as needed for mild pain (pain score 1-3).   BD Pen Needle Short Ultrafine 31G X 8 MM Misc Generic drug: Insulin  Pen Needle USE TO ADMINISTER INSULIN  DAILY.   Blood Glucose Monitoring  Suppl Devi 1 each by Does not apply route in the morning, at noon, and at bedtime. May substitute to any manufacturer covered by patient's insurance.   Blood Glucose Monitoring Suppl Devi 1 each by Does not apply route in the morning, at noon, and at bedtime. May substitute to any manufacturer covered by patient's insurance.   calcium  carbonate 500 MG chewable tablet Commonly known as: TUMS - dosed in mg elemental calcium  Chew 2 tablets by mouth daily.   docusate sodium  100 MG capsule Commonly known as: COLACE Take 1 capsule (100 mg total) by mouth 2 (two) times daily.   folic acid  1 MG tablet Commonly known as: FOLVITE  Take 1 tablet (1 mg total) by mouth daily. Start taking on: March 29, 2024   FreeStyle Libre 3 Plus Sensor Misc Change sensor every 15 days.   Lantus  SoloStar 100 UNIT/ML Solostar Pen Generic drug: insulin  glargine Inject 20 Units into the skin  daily. What changed:  how much to take additional instructions   lipase/protease/amylase 63999 UNITS Cpep capsule Commonly known as: Creon  Take 2 capsules (72,000 Units total) by mouth 3 (three) times daily with meals. What changed:  how much to take when to take this additional instructions   lisinopril  40 MG tablet Commonly known as: ZESTRIL  Take 1 tablet (40 mg total) by mouth daily.   multivitamin with minerals Tabs tablet Take 1 tablet by mouth daily. Start taking on: March 29, 2024   nicotine  14 mg/24hr patch Commonly known as: NICODERM CQ  - dosed in mg/24 hours Place 1 patch (14 mg total) onto the skin daily. Start taking on: March 29, 2024   ondansetron  8 MG disintegrating tablet Commonly known as: ZOFRAN -ODT Take 1 tablet (8 mg total) by mouth every 8 (eight) hours as needed.   oxyCODONE  5 MG immediate release tablet Commonly known as: Roxicodone  Take 1 tablet (5 mg total) by mouth every 6 (six) hours as needed for severe pain (pain score 7-10) or moderate pain (pain score 4-6). What changed:   when to take this reasons to take this   pantoprazole  40 MG tablet Commonly known as: Protonix  Take 1 tablet (40 mg total) by mouth daily.   pioglitazone  30 MG tablet Commonly known as: Actos  Take 1 tablet (30 mg total) by mouth daily.   thiamine  100 MG tablet Commonly known as: Vitamin B-1 Take 1 tablet (100 mg total) by mouth daily. Start taking on: March 29, 2024       No Known Allergies    Procedures/Studies: US  Abdomen Limited Result Date: 03/26/2024 CLINICAL DATA:  [id number] Acute on chronic pancreatitis [facility]) [id number] EXAM: ULTRASOUND ABDOMEN LIMITED RIGHT UPPER QUADRANT COMPARISON:  03/25/2024 FINDINGS: Gallbladder: No gallstones. No wall thickening or pericholecystic fluid. No sonographic Murphy's sign noted by sonographer. Common bile duct: Diameter: 5 mm Liver: Patchy regions of increased parenchymal echogenicity. No focal lesion identified. No intrahepatic biliary ductal dilation. Portal vein is patent on color Doppler imaging with normal direction of blood flow towards the liver. Right Kidney: Partially visualized. No mass. No hydronephrosis or nephrolithiasis. Other: None. IMPRESSION: 1. No cholecystolithiasis or changes of acute cholecystitis. 2. Geographic hepatic steatosis. Electronically Signed   By: Rogelia Myers M.D.   On: 03/26/2024 08:52   CT ABDOMEN PELVIS W CONTRAST Result Date: 03/25/2024 CLINICAL DATA:  Abdomen pain EXAM: CT ABDOMEN AND PELVIS WITH CONTRAST TECHNIQUE: Multidetector CT imaging of the abdomen and pelvis was performed using the standard protocol following bolus administration of intravenous contrast. RADIATION DOSE REDUCTION: This exam was performed according to the departmental dose-optimization program which includes automated exposure control, adjustment of the mA and/or kV according to patient size and/or use of iterative reconstruction technique. CONTRAST:  OMNIPAQUE  IOHEXOL  300 MG/ML  SOLN COMPARISON:  Ultrasound and CT 02/01/2024,  12/09/2023, 04/05/2023 and exams dating back to 2023 FINDINGS: Lower chest: Lung bases demonstrate no acute airspace disease. Hepatobiliary: Geographic hypodensities within the left hepatic lobe presumably representing steatosis. No calcified gallstone or biliary dilatation Pancreas: Dystrophic calcification in the distal pancreas. Mild peripancreatic stranding consistent with pancreatitis, slightly improved compared with June examination. Chronic pseudocysts in the distal pancreas, the larger more distal lesion measures 2.7 cm compared with 2.5 cm previously. The more medial lesion measures 20 mm compared with 22 mm previously. Spleen: Chronic subcapsular fluid collection measuring approximately 9.7 x 6.6 cm, previously 10 x 6.9 cm. Spleen otherwise negative Adrenals/Urinary Tract: Adrenal glands are normal. Kidneys show no hydronephrosis. Bladder demonstrates  mild diffuse thick wall but is under distended Stomach/Bowel: Stomach within normal limits. No dilated small bowel. No acute bowel wall thickening. Negative appendix Vascular/Lymphatic: Nonaneurysmal aorta. No suspicious lymph nodes. 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 Reproductive: Uterus and bilateral adnexa are unremarkable. Other: Negative for pelvic effusion or free air Musculoskeletal: No acute or suspicious osseous abnormality IMPRESSION: 1. Mild peripancreatic stranding consistent with pancreatitis, slightly improved inflammatory changes compared with June examination. Chronic pseudocysts in the distal pancreas, measuring up to 2.7 cm, grossly stable. 2. Chronic splenic subcapsular fluid collection, slightly decreased in size compared with June examination. 3. Other chronic findings as discussed above. 4. Geographic hypodensities within the left hepatic lobe presumably representing steatosis. Electronically Signed   By: Luke Bun M.D.   On: 03/25/2024 20:21       Subjective: Reports that she feels much better. Tolerated soft diet today and states that it was delicious. Mild and tolerable pain. Feels that she can manage without opioids but is asking for a short supply if needed. No nausea or vomiting. Had a hard BM this morning. She was taking frequent doses of IV Dilaudid  yesterday but states that since the pain was better, she avoided taking any IV Dilaudid  last night-noted last dose was at around 10:21 PM followed by next dose at 5:41 AM today and a p.o. pain med today. Feels comfortable going home.   Discharge Exam:  Vitals:   03/27/24 2123 03/27/24 2210 03/28/24 0527 03/28/24 1322  BP: (!) 145/93 (!) 152/92 118/77 (!) 139/92  Pulse: 76 71 80 77  Resp: 20  17 18   Temp: 98.4 F (36.9 C)  97.9 F (36.6 C) 98 F (36.7 C)  TempSrc: Oral  Oral   SpO2: 94%  95% 98%  Weight:      Height:        General exam: Young female, moderately built and nourished lying uncomfortably propped up in bed without distress.  Oral mucosa moist Respiratory system: Clear to auscultation. Respiratory effort normal. Cardiovascular system: S1 & S2 heard, RRR. No JVD, murmurs, rubs, gallops or clicks. No pedal edema.  Telemetry personally reviewed at bedside: Sinus rhythm.  Discontinued telemetry. Gastrointestinal system: Abdomen is nondistended, soft and nontender.  No organomegaly or masses appreciated.  Normal bowel sounds heard. Central nervous system: Alert and oriented. No focal neurological deficits. Extremities: Symmetric 5 x 5 power. Skin: No rashes, lesions or ulcers Psychiatry: Judgement and insight appear normal. Mood & affect appropriate.     The results of significant diagnostics from this hospitalization (including imaging, microbiology, ancillary and laboratory) are listed below for reference.     Microbiology: No results found for this or any previous visit (from the past 240 hours).   Labs: CBC: Recent Labs  Lab 03/25/24 1747  03/26/24 0250 03/27/24 0448 03/28/24 0426  WBC 8.4 6.6 5.4 4.8  HGB 16.5* 14.2 14.6 14.9  HCT 49.0* 42.3 44.8 45.0  MCV 91.6 92.8 97.6 94.5  PLT 178 147* 129* 125*    Basic Metabolic Panel: Recent Labs  Lab 03/25/24 1747 03/25/24 1847 03/26/24 0250 03/26/24 1115 03/27/24 0448 03/28/24 0426  NA 136  --  137 138 135 138  K 2.9*  --  2.8* 3.6 3.8 3.4*  CL 92*  --  103 107 105 104  CO2 28  --  24 21* 22 27  GLUCOSE 252*  --  152* 102* 156* 75  BUN 8  --  6  5* <5* <5*  CREATININE 0.58  --  0.38* 0.34* 0.30* 0.34*  CALCIUM  8.8*  --  7.2* 7.2* 7.8* 8.2*  MG  --  1.3* 1.7 2.4 2.0 1.7  PHOS  --   --  2.0*  --  2.7 3.0    Liver Function Tests: Recent Labs  Lab 03/25/24 1747 03/26/24 0250 03/27/24 0448 03/28/24 0426  AST 29 23 42* 33  ALT 13 11 16 14   ALKPHOS 135* 97 85 75  BILITOT 0.9 0.7 0.4 0.5  PROT 7.6 5.8* 5.9* 5.6*  ALBUMIN 3.8 2.8* 2.7* 2.6*    CBG: Recent Labs  Lab 03/27/24 1138 03/27/24 1657 03/27/24 2126 03/28/24 0752 03/28/24 1135  GLUCAP 141* 107* 130* 87 112*     Urinalysis    Component Value Date/Time   COLORURINE AMBER (A) 03/25/2024 1940   APPEARANCEUR CLOUDY (A) 03/25/2024 1940   LABSPEC 1.028 03/25/2024 1940   PHURINE 5.0 03/25/2024 1940   GLUCOSEU >=500 (A) 03/25/2024 1940   HGBUR MODERATE (A) 03/25/2024 1940   BILIRUBINUR NEGATIVE 03/25/2024 1940   KETONESUR 20 (A) 03/25/2024 1940   PROTEINUR 30 (A) 03/25/2024 1940   UROBILINOGEN 0.2 09/28/2007 0450   NITRITE NEGATIVE 03/25/2024 1940   LEUKOCYTESUR TRACE (A) 03/25/2024 1940      Time coordinating discharge: 35 minutes  SIGNED:  Trenda Mar, MD,  FACP, [facility], [facility], [facility]   [facility] [place]     To contact the attending provider between 7A-7P or the covering provider during after hours 7P-7A, please log into the web site [email or web address] and access using universal [place] password for that web site. If you do not have the  password, please call the hospital operator.

## 2024-03-30 ENCOUNTER — Telehealth: Payer: Self-pay | Admitting: *Deleted

## 2024-03-30 NOTE — Transitions of Care (Post Inpatient/ED Visit) (Signed)
   03/30/2024  Name: Jillian Shepherd MRN: [id number] DOB: [birth year]  Today's TOC FU Call Status: Today's TOC FU Call Status:: Unsuccessful Call (1st Attempt) Unsuccessful Call (1st Attempt) Date: 03/30/24  Attempted to reach the patient regarding the most recent Inpatient/ED visit.  Follow Up Plan: Additional outreach attempts will be made to reach the patient to complete the Transitions of Care (Post Inpatient/ED visit) call.   Mliss Creed [facility], BSN RN Care Manager/ Transition of Care [place]/ [facility] [phone]

## 2024-03-31 ENCOUNTER — Telehealth: Payer: Self-pay | Admitting: *Deleted

## 2024-03-31 NOTE — Transitions of Care (Post Inpatient/ED Visit) (Signed)
   03/31/2024  Name: Jillian Shepherd MRN: [id number] DOB: [birth year]  Today's TOC FU Call Status: Today's TOC FU Call Status:: Unsuccessful Call (2nd Attempt) Unsuccessful Call (2nd Attempt) Date: 03/31/24  Attempted to reach the patient regarding the most recent Inpatient/ED visit.  Follow Up Plan: Additional outreach attempts will be made to reach the patient to complete the Transitions of Care (Post Inpatient/ED visit) call.   Mliss Creed [facility], BSN RN Care Manager/ Transition of Care [place]/ [facility] [phone]

## 2024-04-01 ENCOUNTER — Telehealth: Payer: Self-pay | Admitting: *Deleted

## 2024-04-01 NOTE — Transitions of Care (Post Inpatient/ED Visit) (Signed)
   04/01/2024  Name: Jillian Shepherd MRN: [id number] DOB: 05-28-1986  Today's TOC FU Call Status: Today's TOC FU Call Status:: Unsuccessful Call (3rd Attempt) Unsuccessful Call (3rd Attempt) Date: 04/01/24  Attempted to reach the patient regarding the most recent Inpatient/ED visit.  Follow Up Plan: No further outreach attempts will be made at this time. We have been unable to contact the patient.  Mliss Creed [facility], BSN RN Care Manager/ Transition of Care [place]/ [facility] [phone]

## 2024-04-28 ENCOUNTER — Inpatient Hospital Stay (HOSPITAL_COMMUNITY)
Admission: EM | Admit: 2024-04-28 | Discharge: 2024-05-03 | DRG: 439 | Disposition: A | Discharge status: Home / Self Care | Attending: Internal Medicine | Admitting: Internal Medicine

## 2024-04-28 ENCOUNTER — Encounter (HOSPITAL_COMMUNITY): Payer: Self-pay

## 2024-04-28 ENCOUNTER — Other Ambulatory Visit: Payer: Self-pay

## 2024-04-28 ENCOUNTER — Emergency Department (HOSPITAL_COMMUNITY)

## 2024-04-28 DIAGNOSIS — E871 Hypo-osmolality and hyponatremia: Secondary | ICD-10-CM | POA: Diagnosis present

## 2024-04-28 DIAGNOSIS — Z8249 Family history of ischemic heart disease and other diseases of the circulatory system: Secondary | ICD-10-CM

## 2024-04-28 DIAGNOSIS — K5730 Diverticulosis of large intestine without perforation or abscess without bleeding: Secondary | ICD-10-CM | POA: Diagnosis present

## 2024-04-28 DIAGNOSIS — R9431 Abnormal electrocardiogram [ECG] [EKG]: Secondary | ICD-10-CM | POA: Diagnosis present

## 2024-04-28 DIAGNOSIS — I10 Essential (primary) hypertension: Secondary | ICD-10-CM | POA: Diagnosis present

## 2024-04-28 DIAGNOSIS — Z85828 Personal history of other malignant neoplasm of skin: Secondary | ICD-10-CM | POA: Diagnosis not present

## 2024-04-28 DIAGNOSIS — Z79899 Other long term (current) drug therapy: Secondary | ICD-10-CM | POA: Diagnosis not present

## 2024-04-28 DIAGNOSIS — E876 Hypokalemia: Secondary | ICD-10-CM | POA: Diagnosis present

## 2024-04-28 DIAGNOSIS — Z7984 Long term (current) use of oral hypoglycemic drugs: Secondary | ICD-10-CM

## 2024-04-28 DIAGNOSIS — D649 Anemia, unspecified: Secondary | ICD-10-CM | POA: Diagnosis present

## 2024-04-28 DIAGNOSIS — K219 Gastro-esophageal reflux disease without esophagitis: Secondary | ICD-10-CM | POA: Diagnosis present

## 2024-04-28 DIAGNOSIS — Z716 Tobacco abuse counseling: Secondary | ICD-10-CM

## 2024-04-28 DIAGNOSIS — Z808 Family history of malignant neoplasm of other organs or systems: Secondary | ICD-10-CM | POA: Diagnosis not present

## 2024-04-28 DIAGNOSIS — E1165 Type 2 diabetes mellitus with hyperglycemia: Secondary | ICD-10-CM | POA: Diagnosis present

## 2024-04-28 DIAGNOSIS — K859 Acute pancreatitis without necrosis or infection, unspecified: Principal | ICD-10-CM | POA: Diagnosis present

## 2024-04-28 DIAGNOSIS — F1010 Alcohol abuse, uncomplicated: Secondary | ICD-10-CM | POA: Diagnosis present

## 2024-04-28 DIAGNOSIS — E669 Obesity, unspecified: Secondary | ICD-10-CM | POA: Diagnosis present

## 2024-04-28 DIAGNOSIS — R112 Nausea with vomiting, unspecified: Secondary | ICD-10-CM | POA: Diagnosis not present

## 2024-04-28 DIAGNOSIS — K760 Fatty (change of) liver, not elsewhere classified: Secondary | ICD-10-CM | POA: Diagnosis present

## 2024-04-28 DIAGNOSIS — Z87891 Personal history of nicotine dependence: Secondary | ICD-10-CM | POA: Diagnosis not present

## 2024-04-28 DIAGNOSIS — K861 Other chronic pancreatitis: Secondary | ICD-10-CM | POA: Diagnosis present

## 2024-04-28 DIAGNOSIS — Z720 Tobacco use: Secondary | ICD-10-CM | POA: Insufficient documentation

## 2024-04-28 DIAGNOSIS — F1090 Alcohol use, unspecified, uncomplicated: Secondary | ICD-10-CM | POA: Diagnosis present

## 2024-04-28 DIAGNOSIS — Z833 Family history of diabetes mellitus: Secondary | ICD-10-CM | POA: Diagnosis not present

## 2024-04-28 DIAGNOSIS — Z818 Family history of other mental and behavioral disorders: Secondary | ICD-10-CM

## 2024-04-28 DIAGNOSIS — Z794 Long term (current) use of insulin: Secondary | ICD-10-CM

## 2024-04-28 DIAGNOSIS — R52 Pain, unspecified: Secondary | ICD-10-CM

## 2024-04-28 LAB — URINALYSIS, ROUTINE W REFLEX MICROSCOPIC
Bilirubin Urine: NEGATIVE
Glucose, UA: >=500 mg/dL — AB
Hgb urine dipstick: NEGATIVE
Ketones, ur: 80 mg/dL — AB
Leukocytes,Ua: NEGATIVE
Nitrite: POSITIVE — AB
Protein, ur: NEGATIVE mg/dL
Specific Gravity, Urine: 1.01 (ref 1.005–1.030)
pH: 5 (ref 5.0–8.0)

## 2024-04-28 LAB — COMPREHENSIVE METABOLIC PANEL WITH GFR
ALT: 15 U/L (ref 0–44)
AST: 22 U/L (ref 15–41)
Albumin: 3.9 g/dL (ref 3.5–5.0)
Alkaline Phosphatase: 123 U/L (ref 38–126)
Anion gap: 15 (ref 5–15)
BUN: 10 mg/dL (ref 6–20)
CO2: 25 mmol/L (ref 22–32)
Calcium: 9.3 mg/dL (ref 8.9–10.3)
Chloride: 94 mmol/L — ABNORMAL LOW (ref 98–111)
Creatinine, Ser: 0.55 mg/dL (ref 0.44–1.00)
GFR, Estimated: >60 mL/min (ref >60)
Glucose, Bld: 285 mg/dL — ABNORMAL HIGH (ref 70–99)
Potassium: 3.6 mmol/L (ref 3.5–5.1)
Sodium: 134 mmol/L — ABNORMAL LOW (ref 135–145)
Total Bilirubin: 1.3 mg/dL — ABNORMAL HIGH (ref 0.0–1.2)
Total Protein: 7.9 g/dL (ref 6.5–8.1)

## 2024-04-28 LAB — CBC WITH DIFFERENTIAL/PLATELET
Abs Immature Granulocytes: 0.03 K/uL (ref 0.00–0.07)
Basophils Absolute: 0 K/uL (ref 0.0–0.1)
Basophils Relative: 0 %
Eosinophils Absolute: 0 K/uL (ref 0.0–0.5)
Eosinophils Relative: 0 %
HCT: 49.2 % — ABNORMAL HIGH (ref 36.0–46.0)
Hemoglobin: 17.2 g/dL — ABNORMAL HIGH (ref 12.0–15.0)
Immature Granulocytes: 0 %
Lymphocytes Relative: 21 %
Lymphs Abs: 2 K/uL (ref 0.7–4.0)
MCH: 32.3 pg (ref 26.0–34.0)
MCHC: 35 g/dL (ref 30.0–36.0)
MCV: 92.3 fL (ref 80.0–100.0)
Monocytes Absolute: 0.4 K/uL (ref 0.1–1.0)
Monocytes Relative: 4 %
Neutro Abs: 7.2 K/uL (ref 1.7–7.7)
Neutrophils Relative %: 75 %
Platelets: 208 K/uL (ref 150–400)
RBC: 5.33 MIL/uL — ABNORMAL HIGH (ref 3.87–5.11)
RDW: 15.3 % (ref 11.5–15.5)
WBC: 9.7 K/uL (ref 4.0–10.5)
nRBC: 0 % (ref 0.0–0.2)

## 2024-04-28 LAB — LIPASE, BLOOD: Lipase: 115 U/L — ABNORMAL HIGH (ref 11–51)

## 2024-04-28 LAB — POC URINE PREG, ED: Preg Test, Ur: NEGATIVE

## 2024-04-28 LAB — GLUCOSE, CAPILLARY: Glucose-Capillary: 262 mg/dL — ABNORMAL HIGH (ref 70–99)

## 2024-04-28 MED ORDER — PROCHLORPERAZINE EDISYLATE 10 MG/2ML IJ SOLN
10.0000 mg | Freq: Four times a day (QID) | INTRAMUSCULAR | Status: DC | PRN
  Administered 2024-04-29 – 2024-04-30 (×2): 10 mg via INTRAVENOUS
  Filled 2024-04-28 (×2): qty 2

## 2024-04-28 MED ORDER — ONDANSETRON HCL 4 MG/2ML IJ SOLN
4.0000 mg | Freq: Once | INTRAMUSCULAR | Status: AC
  Administered 2024-04-28: 4 mg via INTRAVENOUS
  Filled 2024-04-28: qty 2

## 2024-04-28 MED ORDER — ACETAMINOPHEN 325 MG PO TABS
650.0000 mg | ORAL_TABLET | Freq: Four times a day (QID) | ORAL | Status: DC | PRN
  Administered 2024-04-30 – 2024-05-02 (×2): 650 mg via ORAL
  Filled 2024-04-28 (×2): qty 2

## 2024-04-28 MED ORDER — INSULIN ASPART 100 UNIT/ML IJ SOLN
0.0000 [IU] | Freq: Three times a day (TID) | INTRAMUSCULAR | Status: DC
  Administered 2024-04-29: 3 [IU] via SUBCUTANEOUS
  Administered 2024-04-29: 5 [IU] via SUBCUTANEOUS
  Administered 2024-04-29: 8 [IU] via SUBCUTANEOUS
  Administered 2024-04-30 (×3): 3 [IU] via SUBCUTANEOUS
  Administered 2024-05-01: 2 [IU] via SUBCUTANEOUS
  Administered 2024-05-02: 5 [IU] via SUBCUTANEOUS
  Administered 2024-05-02: 3 [IU] via SUBCUTANEOUS

## 2024-04-28 MED ORDER — INSULIN GLARGINE-YFGN 100 UNIT/ML ~~LOC~~ SOLN
10.0000 [IU] | Freq: Every day | SUBCUTANEOUS | Status: DC
  Administered 2024-04-29: 10 [IU] via SUBCUTANEOUS
  Filled 2024-04-28 (×2): qty 0.1

## 2024-04-28 MED ORDER — KETOROLAC TROMETHAMINE 15 MG/ML IJ SOLN
15.0000 mg | Freq: Once | INTRAMUSCULAR | Status: AC
  Administered 2024-04-28: 15 mg via INTRAVENOUS
  Filled 2024-04-28: qty 1

## 2024-04-28 MED ORDER — ENOXAPARIN SODIUM 40 MG/0.4ML IJ SOSY
40.0000 mg | PREFILLED_SYRINGE | Freq: Every day | INTRAMUSCULAR | Status: DC
  Filled 2024-04-28 (×3): qty 0.4

## 2024-04-28 MED ORDER — MORPHINE SULFATE (PF) 4 MG/ML IV SOLN
4.0000 mg | Freq: Once | INTRAVENOUS | Status: AC
  Administered 2024-04-28: 4 mg via INTRAVENOUS
  Filled 2024-04-28: qty 1

## 2024-04-28 MED ORDER — ALUM & MAG HYDROXIDE-SIMETH 200-200-20 MG/5ML PO SUSP
30.0000 mL | Freq: Once | ORAL | Status: AC
  Administered 2024-04-28: 30 mL via ORAL
  Filled 2024-04-28: qty 30

## 2024-04-28 MED ORDER — METOCLOPRAMIDE HCL 5 MG/ML IJ SOLN
5.0000 mg | Freq: Once | INTRAMUSCULAR | Status: AC
  Administered 2024-04-28: 5 mg via INTRAVENOUS
  Filled 2024-04-28: qty 2

## 2024-04-28 MED ORDER — ACETAMINOPHEN 650 MG RE SUPP: 650.0000 mg | Freq: Four times a day (QID) | RECTAL | Status: DC | PRN

## 2024-04-28 MED ORDER — SODIUM CHLORIDE 0.9 % IV BOLUS
1000.0000 mL | Freq: Once | INTRAVENOUS | Status: AC
  Administered 2024-04-28: 1000 mL via INTRAVENOUS

## 2024-04-28 MED ORDER — LACTATED RINGERS IV SOLN: INTRAVENOUS | Status: AC

## 2024-04-28 MED ORDER — HYDROCODONE-ACETAMINOPHEN 5-325 MG PO TABS
1.0000 | ORAL_TABLET | Freq: Once | ORAL | Status: AC
  Administered 2024-04-28: 1 via ORAL
  Filled 2024-04-28: qty 1

## 2024-04-28 MED ORDER — HYDROMORPHONE HCL 1 MG/ML IJ SOLN
1.0000 mg | Freq: Once | INTRAMUSCULAR | Status: AC
  Administered 2024-04-28: 1 mg via INTRAVENOUS
  Filled 2024-04-28: qty 1

## 2024-04-28 MED ORDER — PANTOPRAZOLE SODIUM 40 MG IV SOLR
40.0000 mg | Freq: Every day | INTRAVENOUS | Status: DC
Start: 2024-04-28 — End: 2024-05-03
  Administered 2024-04-28 – 2024-05-02 (×5): 40 mg via INTRAVENOUS
  Filled 2024-04-28 (×5): qty 10

## 2024-04-28 MED ORDER — IOHEXOL 300 MG/ML  SOLN
100.0000 mL | Freq: Once | INTRAMUSCULAR | Status: AC | PRN
Start: 2024-04-28 — End: 2024-04-28
  Administered 2024-04-28: 100 mL via INTRAVENOUS

## 2024-04-28 MED ORDER — INSULIN ASPART 100 UNIT/ML IJ SOLN
0.0000 [IU] | Freq: Every day | INTRAMUSCULAR | Status: DC
  Administered 2024-04-28: 3 [IU] via SUBCUTANEOUS
  Administered 2024-04-29 – 2024-04-30 (×2): 2 [IU] via SUBCUTANEOUS
  Administered 2024-05-01: 5 [IU] via SUBCUTANEOUS

## 2024-04-28 MED ORDER — PANCRELIPASE (LIP-PROT-AMYL) 36000-114000 UNITS PO CPEP
72000.0000 [IU] | ORAL_CAPSULE | Freq: Three times a day (TID) | ORAL | Status: DC
  Administered 2024-04-30 – 2024-05-03 (×10): 72000 [IU] via ORAL
  Filled 2024-04-28 (×12): qty 2

## 2024-04-28 MED ORDER — MORPHINE SULFATE (PF) 2 MG/ML IV SOLN
2.0000 mg | INTRAVENOUS | Status: DC | PRN
  Administered 2024-04-29 (×2): 2 mg via INTRAVENOUS
  Filled 2024-04-28 (×2): qty 1

## 2024-04-28 MED ORDER — DIPHENHYDRAMINE HCL 50 MG/ML IJ SOLN
25.0000 mg | Freq: Once | INTRAMUSCULAR | Status: AC
  Administered 2024-04-28: 25 mg via INTRAVENOUS
  Filled 2024-04-28: qty 1

## 2024-04-28 MED ORDER — LIDOCAINE VISCOUS HCL 2 % MT SOLN
15.0000 mL | Freq: Once | OROMUCOSAL | Status: AC
Start: 2024-04-28 — End: 2024-04-28
  Administered 2024-04-28: 15 mL via ORAL
  Filled 2024-04-28: qty 15

## 2024-04-28 MED ORDER — LISINOPRIL 10 MG PO TABS
40.0000 mg | ORAL_TABLET | Freq: Every day | ORAL | Status: DC
  Administered 2024-04-29 – 2024-05-03 (×5): 40 mg via ORAL
  Filled 2024-04-28 (×5): qty 4

## 2024-04-28 NOTE — ED Provider Notes (Signed)
 [place] [facility]PARTMENT AT [facility] Provider Note  CSN: [id number] Arrival date & time: 04/28/24 1607  Chief Complaint(s) Abdominal Pain  HPI Jillian Shepherd is a 38 y.o. female with past medical history as below, significant for alcohol abuse, chronic pancreatitis, type II DM, prolonged QT who presents to the ED with complaint of epigastric pain, concern for pancreatitis  Patient with recurrent episodes of pancreatitis, presumed secondary to alcohol abuse.  Patient had a wine cooler on Saturday, has been having epigastric abdominal pain over the past 2 days, nausea without vomiting.  Pain radiates to her back, burning, sharp and stabbing.  No blood in stool or black-colored stool, no blood in vomit.  No fevers or chills.  Reports pain feels similar to prior episodes pancreatitis but the pain seems worse today than what she has experienced previously.  Past Medical History Past Medical History:  Diagnosis Date   Abdominal pain    Alcohol abuse    B12 deficiency    Cancer (HCC)    squmaous cell skin cancer    Collagen vascular disease    Diabetes mellitus without complication (HCC)    Hypertension    Hypokalemia    Liddle's syndrome    Obesity (BMI 35.0-39.9 without comorbidity)    Pancreatitis    Pleural effusion    Splenic hemorrhage    Patient Active Problem List   Diagnosis Date Noted   Hypocalcemia 03/25/2024   Type 2 diabetes mellitus with hyperglycemia (HCC) 02/01/2024   UTI (urinary tract infection) 02/01/2024   GERD (gastroesophageal reflux disease) 04/05/2023   Elevated lipase 04/05/2023   Chronic alcohol use 04/05/2023   Pseudocyst of pancreas due to acute pancreatitis 04/05/2023   PICC line infection, initial encounter 11/07/2022   Abdominal pain, epigastric 08/19/2022   Acute recurrent pancreatitis 08/15/2022   Pancreatitis, necrotizing 08/15/2022   Cavernous transformation of portal vein 07/24/2022   Leukocytosis 07/21/2022   Chronic  pancreatitis (HCC) 07/18/2022   Dehydration 07/07/2022   Diabetes mellitus type II, controlled (HCC) 07/01/2022   Acute on chronic pancreatitis 06/30/2022   Malnutrition of moderate degree 06/12/2022   Pancreatic pseudocyst 06/11/2022   Acute hypoxic respiratory failure (HCC) 06/10/2022   Pleural effusion on left 06/10/2022   Spleen hematoma 06/08/2022   Acute pancreatitis 05/31/2022   History of splenic vein thrombosis 05/31/2022   Liddle's syndrome 05/31/2022   Hypoxia 05/11/2022   Abdominal pain 05/10/2022   Nausea & vomiting 05/10/2022   Hypokalemia 05/10/2022   Hypomagnesemia 05/10/2022   Prolonged QT interval 05/10/2022   Hyperglycemia 05/10/2022   Alcohol abuse 05/10/2022   Acute alcoholic pancreatitis 05/10/2022   Diarrhea    Pancreatitis, acute 07/27/2020   Sacral back pain 09/14/2019   Bilateral ovarian cysts 09/02/2018   Elevated blood pressure reading without diagnosis of hypertension 01/20/2015   Essential hypertension 07/07/2014   Depression 07/07/2014   Obesity 07/07/2014   Home Medication(s) Prior to Admission medications   Medication Sig Start Date End Date Taking? Authorizing Provider  acetaminophen  (TYLENOL ) 500 MG tablet Take 1,000 mg by mouth every 6 (six) hours as needed for mild pain (pain score 1-3).   Yes [provider]  calcium  carbonate (TUMS - DOSED IN MG ELEMENTAL CALCIUM ) 500 MG chewable tablet Chew 2 tablets by mouth daily.   Yes [provider]  folic acid  (FOLVITE ) 1 MG tablet Take 1 tablet (1 mg total) by mouth daily. 03/29/24  Yes Hongalgi, Anand D, MD  insulin  glargine (LANTUS  SOLOSTAR) 100 UNIT/ML Solostar Pen  Inject 20 Units into the skin daily. Patient taking differently: Inject 10-20 Units into the skin 3 (three) times daily as needed (high blood sugar). Use at least once daily 08/15/23  Yes Pickard, Butler DASEN, MD  lipase/protease/amylase (CREON ) 36000 UNITS CPEP capsule Take 2 capsules (72,000 Units total) by mouth 3  (three) times daily with meals. Patient taking differently: Take 36,000 Units by mouth See admin instructions. Take 2-3 capsules with meals 3 times a day and with snacks 02/04/24  Yes Ricky Fines, MD  lisinopril  (ZESTRIL ) 40 MG tablet Take 1 tablet (40 mg total) by mouth daily. 02/05/24  Yes Ricky Fines, MD  Multiple Vitamin (MULTIVITAMIN WITH MINERALS) TABS tablet Take 1 tablet by mouth daily. 03/29/24  Yes Hongalgi, Anand D, MD  ondansetron  (ZOFRAN -ODT) 8 MG disintegrating tablet Take 1 tablet (8 mg total) by mouth every 8 (eight) hours as needed. Patient taking differently: Take 8 mg by mouth every 8 (eight) hours as needed for vomiting or nausea. 02/04/24  Yes Ricky Fines, MD  pantoprazole  (PROTONIX ) 40 MG tablet Take 1 tablet (40 mg total) by mouth daily. 02/04/24 02/03/25 Yes Ricky Fines, MD  pioglitazone  (ACTOS ) 30 MG tablet Take 1 tablet (30 mg total) by mouth daily. 02/14/24  Yes Duanne Butler DASEN, MD  thiamine  (VITAMIN B-1) 100 MG tablet Take 1 tablet (100 mg total) by mouth daily. 03/29/24  Yes Hongalgi, Trenda BIRCH, MD  ACCU-CHEK GUIDE TEST test strip USE IN THE MORNING, AT NOON, AND AT BEDTIME. MAY SUBSTITUTE TO ANY MANUFACTURER 03/13/24   Duanne Butler DASEN, MD  Accu-Chek Softclix Lancets lancets USE IN THE MORNING, AT NOON, AND AT BEDTIME. MAY SUBSTITUTE 03/17/24   Duanne Butler DASEN, MD  BD PEN NEEDLE SHORT ULTRAFINE 31G X 8 MM MISC USE TO ADMINISTER INSULIN  DAILY. 03/05/24   Duanne Butler DASEN, MD  Blood Glucose Monitoring Suppl DEVI 1 each by Does not apply route in the morning, at noon, and at bedtime. May substitute to any manufacturer covered by patient's insurance. 08/15/23   Duanne Butler DASEN, MD  Blood Glucose Monitoring Suppl DEVI 1 each by Does not apply route in the morning, at noon, and at bedtime. May substitute to any manufacturer covered by patient's insurance. 02/14/24   Duanne Butler DASEN, MD  Continuous Glucose Sensor (FREESTYLE LIBRE 3 PLUS SENSOR) MISC Change sensor every 15 days.  01/16/24   Duanne Butler DASEN, MD  oxyCODONE  (ROXICODONE ) 5 MG immediate release tablet Take 1 tablet (5 mg total) by mouth every 6 (six) hours as needed for severe pain (pain score 7-10) or moderate pain (pain score 4-6). Patient not taking: Reported on 04/28/2024 03/28/24   Judeth Trenda BIRCH, MD                                                                                                                                    Past Surgical History Past Surgical History:  Procedure Laterality Date  ADENOIDECTOMY     CHEST TUBE INSERTION     COLONOSCOPY WITH PROPOFOL  N/A 10/06/2020   Procedure: COLONOSCOPY WITH PROPOFOL ;  Surgeon: Unk Corinn Skiff, MD;  Location: [facility] ENDOSCOPY;  Service: Gastroenterology;  Laterality: N/A;  COVID POSITIVE 08/23/2020   ESOPHAGOGASTRODUODENOSCOPY (EGD) WITH PROPOFOL  N/A 10/06/2020   Procedure: ESOPHAGOGASTRODUODENOSCOPY (EGD) WITH PROPOFOL ;  Surgeon: Unk Corinn Skiff, MD;  Location: [facility] ENDOSCOPY;  Service: Gastroenterology;  Laterality: N/A;   skin cancer removal Left    leg   TONSILLECTOMY     Family History Family History  Problem Relation Age of Onset   Hypertension Father    Anxiety disorder Father    Depression Father    Melanoma Paternal Grandmother        of skin   Diabetes Paternal Grandmother    Cirrhosis Paternal Uncle     Social History Social History   Tobacco Use   Smoking status: Every Day    Current packs/day: 0.25    Average packs/day: 0.3 packs/day for 9.0 years (2.3 ttl pk-yrs)    Types: Cigarettes    Passive exposure: Current   Smokeless tobacco: Never  Vaping Use   Vaping status: Never Used  Substance Use Topics   Alcohol use: Yes    Alcohol/week: 2.0 standard drinks of alcohol    Types: 2 Cans of beer per week   Drug use: No   Allergies Patient has no known allergies.  Review of Systems A thorough review of systems was obtained and all systems are negative except as noted in the HPI and PMH.   Physical  Exam Vital Signs  I have reviewed the triage vital signs BP (!) 150/104   Pulse 87   Temp 97.9 F (36.6 C) (Oral)   Resp 17   Ht 5' 3 (1.6 m)   Wt 73.9 kg   LMP 04/24/2024   SpO2 96%   BMI 28.87 kg/m  Physical Exam Vitals and nursing note reviewed.  Constitutional:      General: She is not in acute distress.    Appearance: Normal appearance. She is well-developed. She is not ill-appearing.  HENT:     Head: Normocephalic and atraumatic.     Right Ear: External ear normal.     Left Ear: External ear normal.     Nose: Nose normal.     Mouth/Throat:     Mouth: Mucous membranes are moist.  Eyes:     General: No scleral icterus.       Right eye: No discharge.        Left eye: No discharge.  Cardiovascular:     Rate and Rhythm: Normal rate.  Pulmonary:     Effort: Pulmonary effort is normal. No respiratory distress.     Breath sounds: No stridor.  Abdominal:     General: Abdomen is flat. There is no distension.     Tenderness: There is abdominal tenderness in the epigastric area. There is no guarding.  Musculoskeletal:        General: No deformity.     Cervical back: No rigidity.  Skin:    General: Skin is warm and dry.     Coloration: Skin is not cyanotic, jaundiced or pale.  Neurological:     Mental Status: She is alert.  Psychiatric:        Speech: Speech normal.        Behavior: Behavior normal. Behavior is cooperative.     ED Results and Treatments Labs (all labs ordered are listed,  but only abnormal results are displayed) Labs Reviewed  LIPASE, BLOOD - Abnormal; Notable for the following components:      Result Value   Lipase 115 (*)    All other components within normal limits  COMPREHENSIVE METABOLIC PANEL WITH GFR - Abnormal; Notable for the following components:   Sodium 134 (*)    Chloride 94 (*)    Glucose, Bld 285 (*)    Total Bilirubin 1.3 (*)    All other components within normal limits  CBC WITH DIFFERENTIAL/PLATELET - Abnormal; Notable for  the following components:   RBC 5.33 (*)    Hemoglobin 17.2 (*)    HCT 49.2 (*)    All other components within normal limits  URINALYSIS, ROUTINE W REFLEX MICROSCOPIC  POC URINE PREG, ED                                                                                                                          Radiology CT ABDOMEN PELVIS W CONTRAST Result Date: 04/28/2024 CLINICAL DATA:  Epigastric pain. EXAM: CT ABDOMEN AND PELVIS WITH CONTRAST TECHNIQUE: Multidetector CT imaging of the abdomen and pelvis was performed using the standard protocol following bolus administration of intravenous contrast. RADIATION DOSE REDUCTION: This exam was performed according to the departmental dose-optimization program which includes automated exposure control, adjustment of the mA and/or kV according to patient size and/or use of iterative reconstruction technique. CONTRAST:  OMNIPAQUE  IOHEXOL  300 MG/ML  SOLN COMPARISON:  CT abdomen and pelvis 03/25/2024. FINDINGS: Lower chest: No acute abnormality. Hepatobiliary: There is diffuse fatty infiltration of the liver. Gallbladder and bile ducts are within normal limits. Pancreas: There is mild diffuse peripancreatic fat stranding. Pancreatic tail calcifications are again seen. There are 2 cystic structures in the tail of the pancreas measuring 2.3 cm and 1.8 cm similar to the prior examination. No new fluid collections are identified. Spleen: Exophytic subcapsular fluid collection measuring 9.4 x 6.4 cm appears unchanged. Underlying spleen is within normal limits. Adrenals/Urinary Tract: Adrenal glands are unremarkable. Kidneys are normal, without renal calculi, focal lesion, or hydronephrosis. Bladder is unremarkable. Stomach/Bowel: Stomach is within normal limits. Appendix appears normal. No evidence of bowel wall thickening, distention, or inflammatory changes. There is sigmoid colon diverticulosis. Vascular/Lymphatic: No significant vascular findings are present. No  enlarged abdominal or pelvic lymph nodes. Reproductive: Uterus and bilateral adnexa are unremarkable. Other: No abdominal wall hernia or abnormality. No abdominopelvic ascites. Musculoskeletal: No acute or significant osseous findings. IMPRESSION: 1. Findings compatible with acute on chronic pancreatitis. 2. Stable cystic structures in the tail of the pancreas, likely pseudocysts. 3. Stable subcapsular splenic fluid collection. 4. Fatty infiltration of the liver. 5. Sigmoid colon diverticulosis. Electronically Signed   By: Greig Pique M.D.   On: 04/28/2024 18:57    Pertinent labs & imaging results that were available during my care of the patient were reviewed by me and considered in my medical decision making (see MDM for details).  Medications Ordered in  ED Medications  ketorolac  (TORADOL ) 15 MG/ML injection 15 mg (has no administration in time range)  morphine  (PF) 4 MG/ML injection 4 mg (has no administration in time range)  metoCLOPramide  (REGLAN ) injection 5 mg (has no administration in time range)  diphenhydrAMINE  (BENADRYL ) injection 25 mg (has no administration in time range)  sodium chloride  0.9 % bolus 1,000 mL (1,000 mLs Intravenous Bolus 04/28/24 1806)  ondansetron  (ZOFRAN ) injection 4 mg (4 mg Intravenous Given 04/28/24 1806)  morphine  (PF) 4 MG/ML injection 4 mg (4 mg Intravenous Given 04/28/24 1805)  iohexol  (OMNIPAQUE ) 300 MG/ML solution 100 mL (100 mLs Intravenous Contrast Given 04/28/24 1818)  HYDROmorphone  (DILAUDID ) injection 1 mg (1 mg Intravenous Given 04/28/24 1850)  alum & mag hydroxide-simeth (MAALOX/MYLANTA) 200-200-20 MG/5ML suspension 30 mL (30 mLs Oral Given 04/28/24 1900)    And  lidocaine  (XYLOCAINE ) 2 % viscous mouth solution 15 mL (15 mLs Oral Given 04/28/24 1900)  HYDROcodone -acetaminophen  (NORCO/VICODIN) 5-325 MG per tablet 1 tablet (1 tablet Oral Given 04/28/24 1946)                                                                                                                                      Procedures Procedures  (including critical care time)  CRITICAL CARE Performed by: Jayson DELENA Pereyra   Total critical care time: 30 minutes  Critical care time was exclusive of separately billable procedures and treating other patients.  Critical care was necessary to treat or prevent imminent or life-threatening deterioration.  Critical care was time spent personally by me on the following activities: development of treatment plan with patient and/or surrogate as well as nursing, discussions with consultants, evaluation of patient's response to treatment, examination of patient, obtaining history from patient or surrogate, ordering and performing treatments and interventions, ordering and review of laboratory studies, ordering and review of radiographic studies, pulse oximetry and re-evaluation of patient's condition. Intractable pain, intractable n/v   Medical Decision Making / ED Course    Medical Decision Making:    Kinzie Wickes is a 38 y.o. female with past medical history as below, significant for alcohol abuse, chronic pancreatitis, type II DM, prolonged QT who presents to the ED with complaint of epigastric pain, concern for pancreatitis. The complaint involves an extensive differential diagnosis and also carries with it a high risk of complications and morbidity.  Serious etiology was considered. Ddx includes but is not limited to: Differential diagnosis includes but is not exclusive to acute cholecystitis, intrathoracic causes for epigastric abdominal pain, gastritis, duodenitis, pancreatitis, small bowel or large bowel obstruction, abdominal aortic aneurysm, hernia, gastritis, etc.   Complete initial physical exam performed, notably the patient was in no acute distress, HDS.    Reviewed and confirmed nursing documentation for past medical history, family history, social history.  Vital signs reviewed.    Epigastric abdominal pain Acute on  chronic pancreatitis> - Patient recent alcohol use, having epigastric abdominal pain,  nausea and vomiting.  Lipase is elevated.  Concern for acute on chronic pancreatitis - Patient was admitted 03/25/2024 with pancreatitis - CT abdomen pelvis: acute on chronic pancreatitis  - Provided fluids, analgesia, antiemetic, pt still having ongoing pain, rpt meds - intractable pain, intractable nausea/vomiting - hemoconcentration noted, given IVF - plan admission for acute on chronic pancreatitis  - admit [facility]                   Additional history obtained: -Additional history obtained from na -External records from outside source obtained and reviewed including: Chart review including previous notes, labs, imaging, consultation notes including  Prior admission, prior ER evaluation, medications   Lab Tests: -I ordered, reviewed, and interpreted labs.   The pertinent results include:   Labs Reviewed  LIPASE, BLOOD - Abnormal; Notable for the following components:      Result Value   Lipase 115 (*)    All other components within normal limits  COMPREHENSIVE METABOLIC PANEL WITH GFR - Abnormal; Notable for the following components:   Sodium 134 (*)    Chloride 94 (*)    Glucose, Bld 285 (*)    Total Bilirubin 1.3 (*)    All other components within normal limits  CBC WITH DIFFERENTIAL/PLATELET - Abnormal; Notable for the following components:   RBC 5.33 (*)    Hemoglobin 17.2 (*)    HCT 49.2 (*)    All other components within normal limits  URINALYSIS, ROUTINE W REFLEX MICROSCOPIC  POC URINE PREG, ED    Notable for lipase +  EKG   EKG Interpretation Date/Time:    Ventricular Rate:    PR Interval:    QRS Duration:    QT Interval:    QTC Calculation:   R Axis:      Text Interpretation:           Imaging Studies ordered: I ordered imaging studies including CTAP I independently visualized the following imaging with scope of interpretation limited to determining  acute life threatening conditions related to emergency care; findings noted above I agree with the radiologist interpretation If any imaging was obtained with contrast I closely monitored patient for any possible adverse reaction a/w contrast administration in the emergency department   Medicines ordered and prescription drug management: Meds ordered this encounter  Medications   sodium chloride  0.9 % bolus 1,000 mL   ondansetron  (ZOFRAN ) injection 4 mg   morphine  (PF) 4 MG/ML injection 4 mg   iohexol  (OMNIPAQUE ) 300 MG/ML solution 100 mL   HYDROmorphone  (DILAUDID ) injection 1 mg   AND Linked Order Group    alum & mag hydroxide-simeth (MAALOX/MYLANTA) 200-200-20 MG/5ML suspension 30 mL    lidocaine  (XYLOCAINE ) 2 % viscous mouth solution 15 mL   HYDROcodone -acetaminophen  (NORCO/VICODIN) 5-325 MG per tablet 1 tablet    Refill:  0   ketorolac  (TORADOL ) 15 MG/ML injection 15 mg   morphine  (PF) 4 MG/ML injection 4 mg   metoCLOPramide  (REGLAN ) injection 5 mg   diphenhydrAMINE  (BENADRYL ) injection 25 mg    -I have reviewed the patients home medicines and have made adjustments as needed   Consultations Obtained: I requested consultation with the [facility],  and discussed lab and imaging findings as well as pertinent plan   Cardiac Monitoring: Continuous pulse oximetry interpreted by myself, 98% on RA.    Social Determinants of Health:  Diagnosis or treatment significantly limited by social determinants of health: current smoker and alcohol use Counseled patient for approximately 3 minutes regarding  smoking cessation. Discussed risks of smoking and how they applied and affected their visit here today. Patient not ready to quit at this time, however will follow up with their primary doctor when they are.   CPT code: 00593: intermediate counseling for smoking cessation     Reevaluation: After the interventions noted above, I reevaluated the patient and found that they have stayed the  same  Co morbidities that complicate the patient evaluation  Past Medical History:  Diagnosis Date   Abdominal pain    Alcohol abuse    B12 deficiency    Cancer (HCC)    squmaous cell skin cancer    Collagen vascular disease    Diabetes mellitus without complication (HCC)    Hypertension    Hypokalemia    Liddle's syndrome    Obesity (BMI 35.0-39.9 without comorbidity)    Pancreatitis    Pleural effusion    Splenic hemorrhage       Dispostion: Disposition decision including need for hospitalization was considered, and patient admitted to the hospital.    Final Clinical Impression(s) / ED Diagnoses Final diagnoses:  Acute on chronic pancreatitis (HCC)  Alcohol use  Intractable pain        Elnor Jayson LABOR, DO 04/28/24 2150

## 2024-04-28 NOTE — H&P (Signed)
 History and Physical    Patient: Jillian Shepherd FMW:[id number] DOB: 09-Jun-1986 DOA: 04/28/2024 DOS: the patient was seen and examined on 04/28/2024 PCP: Duanne Butler DASEN, MD  Patient coming from: Home  Chief Complaint:  Chief Complaint  Patient presents with   Abdominal Pain   HPI: Jillian Shepherd is a 38 y.o. female with medical history significant of Liddle syndrome, alcoholic chronic pancreatitis, recurrent pancreatitis with pseudocyst formation, recurrent admissions for same, alcohol use disorder, tobacco abuse, type II DM, hypertension who presents to the emergency department due to epigastric abdominal pain associated with nausea and vomiting which started today.  Pain was sharp, stabbing and burning in nature and radiates to the back.  Pain was similar to prior episodes of pancreatitis.  She endorsed cutting back on alcohol consumption with last intake being on Saturday (9/20).  She denies fever, chills, chest pain, shortness of breath or any alcohol withdrawal symptoms.  Patient was recently admitted from 8/20 to 8/23 due to acute on chronic alcoholic pancreatitis with pseudocyst, recurrent pancreatitis She was also admitted from 6/28 to 7/1 due to similar presentation-acute on chronic pancreatitis   ED Course:  In the emergency department, BP was 173/120, pulse 107 bpm, other vital signs were within normal range.  Workup in the ED showed normocytic anemia.  BMP was normal except for sodium of 134, chloride 94, blood glucose 285.  Lipase 115.  Pregnancy test was negative, urinalysis was positive for glycosuria and nitrites. CT abdomen and pelvis showed findings compatible with acute on chronic pancreatitis. Stable cystic structures in the tail of the pancreas, likely pseudocysts. Stable subcapsular splenic fluid collection. Fatty infiltration of the liver. Sigmoid colon diverticulosis. GI cocktail, pain medication was given, Reglan  was given.  IV hydration was provided. [facility] was asked  to admit patient.  Review of Systems: Review of systems as noted in the HPI. All other systems reviewed and are negative.   Past Medical History:  Diagnosis Date   Abdominal pain    Alcohol abuse    B12 deficiency    Cancer (HCC)    squmaous cell skin cancer    Collagen vascular disease    Diabetes mellitus without complication (HCC)    Hypertension    Hypokalemia    Liddle's syndrome    Obesity (BMI 35.0-39.9 without comorbidity)    Pancreatitis    Pleural effusion    Splenic hemorrhage    Past Surgical History:  Procedure Laterality Date   ADENOIDECTOMY     CHEST TUBE INSERTION     COLONOSCOPY WITH PROPOFOL  N/A 10/06/2020   Procedure: COLONOSCOPY WITH PROPOFOL ;  Surgeon: Unk Corinn Skiff, MD;  Location: [facility] ENDOSCOPY;  Service: Gastroenterology;  Laterality: N/A;  COVID POSITIVE 08/23/2020   ESOPHAGOGASTRODUODENOSCOPY (EGD) WITH PROPOFOL  N/A 10/06/2020   Procedure: ESOPHAGOGASTRODUODENOSCOPY (EGD) WITH PROPOFOL ;  Surgeon: Unk Corinn Skiff, MD;  Location: [facility] ENDOSCOPY;  Service: Gastroenterology;  Laterality: N/A;   skin cancer removal Left    leg   TONSILLECTOMY      Social History:  reports that she has been smoking cigarettes. She has a 2.3 pack-year smoking history. She has been exposed to tobacco smoke. She has never used smokeless tobacco. She reports current alcohol use of about 2.0 standard drinks of alcohol per week. She reports that she does not use drugs.   No Known Allergies  Family History  Problem Relation Age of Onset   Hypertension Father    Anxiety disorder Father    Depression Father    Melanoma Paternal  Grandmother        of skin   Diabetes Paternal Grandmother    Cirrhosis Paternal Uncle      Prior to Admission medications   Medication Sig Start Date End Date Taking? Authorizing Provider  acetaminophen  (TYLENOL ) 500 MG tablet Take 1,000 mg by mouth every 6 (six) hours as needed for mild pain (pain score 1-3).   Yes [provider]  calcium  carbonate (TUMS - DOSED IN MG ELEMENTAL CALCIUM ) 500 MG chewable tablet Chew 2 tablets by mouth daily.   Yes [provider]  folic acid  (FOLVITE ) 1 MG tablet Take 1 tablet (1 mg total) by mouth daily. 03/29/24  Yes Hongalgi, Anand D, MD  insulin  glargine (LANTUS  SOLOSTAR) 100 UNIT/ML Solostar Pen Inject 20 Units into the skin daily. Patient taking differently: Inject 10-20 Units into the skin 3 (three) times daily as needed (high blood sugar). Use at least once daily 08/15/23  Yes Pickard, Butler DASEN, MD  lipase/protease/amylase (CREON ) 36000 UNITS CPEP capsule Take 2 capsules (72,000 Units total) by mouth 3 (three) times daily with meals. Patient taking differently: Take 36,000 Units by mouth See admin instructions. Take 2-3 capsules with meals 3 times a day and with snacks 02/04/24  Yes Ricky Fines, MD  lisinopril  (ZESTRIL ) 40 MG tablet Take 1 tablet (40 mg total) by mouth daily. 02/05/24  Yes Ricky Fines, MD  Multiple Vitamin (MULTIVITAMIN WITH MINERALS) TABS tablet Take 1 tablet by mouth daily. 03/29/24  Yes Hongalgi, Anand D, MD  ondansetron  (ZOFRAN -ODT) 8 MG disintegrating tablet Take 1 tablet (8 mg total) by mouth every 8 (eight) hours as needed. Patient taking differently: Take 8 mg by mouth every 8 (eight) hours as needed for vomiting or nausea. 02/04/24  Yes Ricky Fines, MD  pantoprazole  (PROTONIX ) 40 MG tablet Take 1 tablet (40 mg total) by mouth daily. 02/04/24 02/03/25 Yes Ricky Fines, MD  pioglitazone  (ACTOS ) 30 MG tablet Take 1 tablet (30 mg total) by mouth daily. 02/14/24  Yes Duanne Butler DASEN, MD  thiamine  (VITAMIN B-1) 100 MG tablet Take 1 tablet (100 mg total) by mouth daily. 03/29/24  Yes Hongalgi, Trenda BIRCH, MD  ACCU-CHEK GUIDE TEST test strip USE IN THE MORNING, AT NOON, AND AT BEDTIME. MAY SUBSTITUTE TO ANY MANUFACTURER 03/13/24   Duanne Butler DASEN, MD  Accu-Chek Softclix Lancets lancets USE IN THE MORNING, AT NOON, AND AT BEDTIME. MAY SUBSTITUTE 03/17/24    Duanne Butler DASEN, MD  BD PEN NEEDLE SHORT ULTRAFINE 31G X 8 MM MISC USE TO ADMINISTER INSULIN  DAILY. 03/05/24   Duanne Butler DASEN, MD  Blood Glucose Monitoring Suppl DEVI 1 each by Does not apply route in the morning, at noon, and at bedtime. May substitute to any manufacturer covered by patient's insurance. 08/15/23   Duanne Butler DASEN, MD  Blood Glucose Monitoring Suppl DEVI 1 each by Does not apply route in the morning, at noon, and at bedtime. May substitute to any manufacturer covered by patient's insurance. 02/14/24   Duanne Butler DASEN, MD  Continuous Glucose Sensor (FREESTYLE LIBRE 3 PLUS SENSOR) MISC Change sensor every 15 days. 01/16/24   Duanne Butler DASEN, MD  oxyCODONE  (ROXICODONE ) 5 MG immediate release tablet Take 1 tablet (5 mg total) by mouth every 6 (six) hours as needed for severe pain (pain score 7-10) or moderate pain (pain score 4-6). Patient not taking: Reported on 04/28/2024 03/28/24   Judeth Trenda BIRCH, MD    Physical Exam: BP 126/89 (BP Location: Right Arm)   Pulse  91   Temp 97.8 F (36.6 C) (Oral)   Resp 18   Ht 5' 3 (1.6 m)   Wt 72.2 kg   LMP 04/24/2024   SpO2 93%   BMI 28.20 kg/m   General: 38 y.o. year-old female well developed well nourished in no acute distress.  Alert and oriented x3. HEENT: NCAT, EOMI Neck: Supple, trachea medial Cardiovascular: Regular rate and rhythm with no rubs or gallops.  No thyromegaly or JVD noted.  No lower extremity edema. 2/4 pulses in all 4 extremities. Respiratory: Clear to auscultation with no wheezes or rales. Good inspiratory effort. Abdomen: Soft, nontender nondistended with normal bowel sounds x4 quadrants. Muskuloskeletal: No cyanosis, clubbing or edema noted bilaterally Neuro: CN II-XII intact, strength 5/5 x 4, sensation, reflexes intact Skin: No ulcerative lesions noted or rashes Psychiatry: Judgement and insight appear normal. Mood is appropriate for condition and setting          Labs on Admission:  Basic  Metabolic Panel: Recent Labs  Lab 04/28/24 1655  NA 134*  K 3.6  CL 94*  CO2 25  GLUCOSE 285*  BUN 10  CREATININE 0.55  CALCIUM  9.3   Liver Function Tests: Recent Labs  Lab 04/28/24 1655  AST 22  ALT 15  ALKPHOS 123  BILITOT 1.3*  PROT 7.9  ALBUMIN 3.9   Recent Labs  Lab 04/28/24 1655  LIPASE 115*   No results for input(s): AMMONIA  in the last 168 hours. CBC: Recent Labs  Lab 04/28/24 1655  WBC 9.7  NEUTROABS 7.2  HGB 17.2*  HCT 49.2*  MCV 92.3  PLT 208   Cardiac Enzymes: No results for input(s): CKTOTAL, CKMB, CKMBINDEX, TROPONINI in the last 168 hours.  BNP (last 3 results) No results for input(s): BNP in the last 8760 hours.  ProBNP (last 3 results) No results for input(s): PROBNP in the last 8760 hours.  CBG: Recent Labs  Lab 04/28/24 2303  GLUCAP 262*    Radiological Exams on Admission: CT ABDOMEN PELVIS W CONTRAST Result Date: 04/28/2024 CLINICAL DATA:  Epigastric pain. EXAM: CT ABDOMEN AND PELVIS WITH CONTRAST TECHNIQUE: Multidetector CT imaging of the abdomen and pelvis was performed using the standard protocol following bolus administration of intravenous contrast. RADIATION DOSE REDUCTION: This exam was performed according to the departmental dose-optimization program which includes automated exposure control, adjustment of the mA and/or kV according to patient size and/or use of iterative reconstruction technique. CONTRAST:  OMNIPAQUE  IOHEXOL  300 MG/ML  SOLN COMPARISON:  CT abdomen and pelvis 03/25/2024. FINDINGS: Lower chest: No acute abnormality. Hepatobiliary: There is diffuse fatty infiltration of the liver. Gallbladder and bile ducts are within normal limits. Pancreas: There is mild diffuse peripancreatic fat stranding. Pancreatic tail calcifications are again seen. There are 2 cystic structures in the tail of the pancreas measuring 2.3 cm and 1.8 cm similar to the prior examination. No new fluid collections are  identified. Spleen: Exophytic subcapsular fluid collection measuring 9.4 x 6.4 cm appears unchanged. Underlying spleen is within normal limits. Adrenals/Urinary Tract: Adrenal glands are unremarkable. Kidneys are normal, without renal calculi, focal lesion, or hydronephrosis. Bladder is unremarkable. Stomach/Bowel: Stomach is within normal limits. Appendix appears normal. No evidence of bowel wall thickening, distention, or inflammatory changes. There is sigmoid colon diverticulosis. Vascular/Lymphatic: No significant vascular findings are present. No enlarged abdominal or pelvic lymph nodes. Reproductive: Uterus and bilateral adnexa are unremarkable. Other: No abdominal wall hernia or abnormality. No abdominopelvic ascites. Musculoskeletal: No acute or significant osseous findings. IMPRESSION: 1.  Findings compatible with acute on chronic pancreatitis. 2. Stable cystic structures in the tail of the pancreas, likely pseudocysts. 3. Stable subcapsular splenic fluid collection. 4. Fatty infiltration of the liver. 5. Sigmoid colon diverticulosis. Electronically Signed   By: Greig Pique M.D.   On: 04/28/2024 18:57    EKG: I independently viewed the EKG done and my findings are as followed: Normal sinus rhythm with QTc of 504-second  Assessment/Plan Present on Admission:  Acute on chronic pancreatitis  Nausea & vomiting  Prolonged QT interval  Type 2 diabetes mellitus with hyperglycemia (HCC)  Essential hypertension  GERD (gastroesophageal reflux disease)  Principal Problem:   Acute on chronic pancreatitis Active Problems:   Nausea & vomiting   Essential hypertension   Prolonged QT interval   GERD (gastroesophageal reflux disease)   Type 2 diabetes mellitus with hyperglycemia (HCC)   Tobacco abuse   Acute on chronic pancreatitis Continue IV Compazine  p.r.n Continue IV morphine  p.r.n for pain Continue Protonix  Continue Creon  Continue IV LR at 100 ml/Hr Continue full liquid diet with plan to  advance diet as tolerated RUQ U/S in the morning to investigate biliary etiology (gallstone and bile duct dilatation  Nausea and vomiting Continue Compazine  as needed  Prolonged QT interval Avoid QT prolonging drugs Magnesium  level will be checked Repeat EKG in the morning  Type 2 diabetes mellitus with hyperglycemia Hemoglobin A1c on 01/16/2023 was 12.5 Continue Semglee  10 units nightly and adjust dose accordingly Continue ISS and hypoglycemia protocol   Essential hypertension Continue lisinopril    GERD Continue Protonix    Alcohol abuse Last alcohol intake was on Saturday (9/20) Patient denies any alcohol withdrawal symptoms She was counseled on alcohol abuse cessation  Tobacco abuse Patient is a 17-year history of tobacco abuse She states that she quit smoking about 3 weeks ago  DVT prophylaxis: Lovenox   Code Status: Full code  Family Communication: None at bedside  Consults: None  Severity of Illness: The appropriate patient status for this patient is INPATIENT. Inpatient status is judged to be reasonable and necessary in order to provide the required intensity of service to ensure the patient's safety. The patient's presenting symptoms, physical exam findings, and initial radiographic and laboratory data in the context of their chronic comorbidities is felt to place them at high risk for further clinical deterioration. Furthermore, it is not anticipated that the patient will be medically stable for discharge from the hospital within 2 midnights of admission.   * I certify that at the point of admission it is my clinical judgment that the patient will require inpatient hospital care spanning beyond 2 midnights from the point of admission due to high intensity of service, high risk for further deterioration and high frequency of surveillance required.*  Author: Grey Schlauch, DO 04/28/2024 11:24 PM  For on call review [email or web address].

## 2024-04-28 NOTE — ED Notes (Signed)
 Pt PO challeneged per MD request. Pt stated, I've been able to keep water  down. Refused crackers. I'm scared to try the crackers. Im still nauseous and in pain. EDP made aware.

## 2024-04-28 NOTE — ED Notes (Signed)
 Patient is now complaining of chest pain. EKG obtained and given to MD Elnor

## 2024-04-28 NOTE — ED Triage Notes (Signed)
 Pt arrives ambulatory to ED with c/o upper epigastric pain with radiation to right side starting this morning after eating. Pt reports cutting back on ETOH with last drink Saturday.

## 2024-04-29 ENCOUNTER — Inpatient Hospital Stay (HOSPITAL_COMMUNITY)

## 2024-04-29 DIAGNOSIS — K859 Acute pancreatitis without necrosis or infection, unspecified: Secondary | ICD-10-CM | POA: Diagnosis not present

## 2024-04-29 DIAGNOSIS — K861 Other chronic pancreatitis: Secondary | ICD-10-CM | POA: Diagnosis not present

## 2024-04-29 LAB — CBC
HCT: 46.3 % — ABNORMAL HIGH (ref 36.0–46.0)
Hemoglobin: 15.7 g/dL — ABNORMAL HIGH (ref 12.0–15.0)
MCH: 31.8 pg (ref 26.0–34.0)
MCHC: 33.9 g/dL (ref 30.0–36.0)
MCV: 93.7 fL (ref 80.0–100.0)
Platelets: 183 K/uL (ref 150–400)
RBC: 4.94 MIL/uL (ref 3.87–5.11)
RDW: 15.1 % (ref 11.5–15.5)
WBC: 10.2 K/uL (ref 4.0–10.5)
nRBC: 0 % (ref 0.0–0.2)

## 2024-04-29 LAB — GLUCOSE, CAPILLARY
Glucose-Capillary: 253 mg/dL — ABNORMAL HIGH (ref 70–99)
Glucose-Capillary: 179 mg/dL — ABNORMAL HIGH (ref 70–99)
Glucose-Capillary: 234 mg/dL — ABNORMAL HIGH (ref 70–99)
Glucose-Capillary: 221 mg/dL — ABNORMAL HIGH (ref 70–99)

## 2024-04-29 LAB — COMPREHENSIVE METABOLIC PANEL WITH GFR
ALT: 12 U/L (ref 0–44)
AST: 16 U/L (ref 15–41)
Albumin: 3.4 g/dL — ABNORMAL LOW (ref 3.5–5.0)
Alkaline Phosphatase: 103 U/L (ref 38–126)
Anion gap: 11 (ref 5–15)
BUN: 11 mg/dL (ref 6–20)
CO2: 22 mmol/L (ref 22–32)
Calcium: 8.2 mg/dL — ABNORMAL LOW (ref 8.9–10.3)
Chloride: 100 mmol/L (ref 98–111)
Creatinine, Ser: 0.39 mg/dL — ABNORMAL LOW (ref 0.44–1.00)
GFR, Estimated: >60 mL/min (ref >60)
Glucose, Bld: 174 mg/dL — ABNORMAL HIGH (ref 70–99)
Potassium: 3 mmol/L — ABNORMAL LOW (ref 3.5–5.1)
Sodium: 133 mmol/L — ABNORMAL LOW (ref 135–145)
Total Bilirubin: 1.1 mg/dL (ref 0.0–1.2)
Total Protein: 6.8 g/dL (ref 6.5–8.1)

## 2024-04-29 LAB — HEMOGLOBIN A1C
Hgb A1c MFr Bld: 11.1 % — ABNORMAL HIGH (ref 4.8–5.6)
Mean Plasma Glucose: 271.87 mg/dL

## 2024-04-29 LAB — MAGNESIUM: Magnesium: 1.7 mg/dL (ref 1.7–2.4)

## 2024-04-29 LAB — PHOSPHORUS: Phosphorus: 2.4 mg/dL — ABNORMAL LOW (ref 2.5–4.6)

## 2024-04-29 MED ORDER — HYDROMORPHONE HCL 1 MG/ML IJ SOLN
0.5000 mg | INTRAMUSCULAR | Status: DC | PRN
  Administered 2024-04-29 – 2024-05-02 (×23): 0.5 mg via INTRAVENOUS
  Filled 2024-04-29 (×23): qty 0.5

## 2024-04-29 MED ORDER — HYDRALAZINE HCL 20 MG/ML IJ SOLN
10.0000 mg | INTRAMUSCULAR | Status: DC | PRN
Start: 2024-04-29 — End: 2024-05-03
  Filled 2024-04-29: qty 1

## 2024-04-29 MED ORDER — LACTATED RINGERS IV SOLN: INTRAVENOUS | Status: DC

## 2024-04-29 MED ORDER — POTASSIUM CHLORIDE CRYS ER 20 MEQ PO TBCR
40.0000 meq | EXTENDED_RELEASE_TABLET | Freq: Two times a day (BID) | ORAL | Status: AC
  Administered 2024-04-29 (×2): 40 meq via ORAL
  Filled 2024-04-29 (×2): qty 2

## 2024-04-29 MED ORDER — HYDROMORPHONE HCL 1 MG/ML IJ SOLN: 0.5000 mg | INTRAMUSCULAR | Status: DC | PRN

## 2024-04-29 MED ORDER — INSULIN GLARGINE 100 UNIT/ML ~~LOC~~ SOLN
15.0000 [IU] | Freq: Every day | SUBCUTANEOUS | Status: DC
  Administered 2024-04-29 – 2024-05-02 (×4): 15 [IU] via SUBCUTANEOUS
  Filled 2024-04-29 (×5): qty 0.15

## 2024-04-29 NOTE — Progress Notes (Signed)
 PROGRESS NOTE    Jillian Shepherd  FMW:[id number] DOB: 03-Apr-1986 DOA: 04/28/2024 PCP: Duanne Butler DASEN, MD   Brief Narrative:    Jillian Shepherd is a 38 y.o. female with medical history significant of Liddle syndrome, alcoholic chronic pancreatitis, recurrent pancreatitis with pseudocyst formation, recurrent admissions for same, alcohol use disorder, tobacco abuse, type II DM, hypertension who presents to the emergency department due to epigastric abdominal pain associated with nausea and vomiting which started today.  Patient has been admitted for recurrent acute on chronic pancreatitis and is requiring IV pain medications.  Assessment & Plan:   Principal Problem:   Acute on chronic pancreatitis Active Problems:   Nausea & vomiting   Essential hypertension   Prolonged QT interval   GERD (gastroesophageal reflux disease)   Type 2 diabetes mellitus with hyperglycemia (HCC)   Tobacco abuse  Assessment and Plan:   Acute on chronic pancreatitis Continue IV Compazine  p.r.n Continue IV Dilaudid  0.5 mg every 3 hours p.r.n for pain Continue Protonix  Continue Creon  Continue IV LR at 100 ml/Hr Decrease to clear liquid diet due to ongoing pain RUQ U/S with minimal sludge with no cholelithiasis or cholecystitis   Nausea and vomiting Continue Compazine  as needed   Hypokalemia Replete and reevaluate in a.m.   Type 2 diabetes mellitus with hyperglycemia Hemoglobin A1c on 01/16/2023 was 12.5 Semglee  adjusted to 15 units daily and A1c ordered Continue ISS and hypoglycemia protocol   Essential hypertension Continue lisinopril  With blood pressure elevations, will add IV hydralazine    GERD Continue Protonix    Alcohol abuse Last alcohol intake was on Saturday (9/20) Patient denies any alcohol withdrawal symptoms She was counseled on alcohol abuse cessation   Tobacco abuse Patient is a 17-year history of tobacco abuse She states that she quit smoking about 3 weeks ago    DVT  prophylaxis:Lovenox  Code Status: Full Family Communication: None at bedside Disposition Plan:  Status is: Inpatient Remains inpatient appropriate because: Need for ongoing IV medications  Consultants:  None  Procedures:  None  Antimicrobials:  None   Subjective: Patient seen and evaluated today with ongoing nausea with last episode of vomiting yesterday evening.  She continues to have ongoing pain.  Objective: Vitals:   04/28/24 2302 04/29/24 0411 04/29/24 0547 04/29/24 0805  BP: 126/89 (!) 190/122 (!) 171/108 (!) 174/117  Pulse: 91 72  73  Resp: 18 18  17   Temp: 97.8 F (36.6 C) 98 F (36.7 C)  97.7 F (36.5 C)  TempSrc: Oral Oral  Oral  SpO2: 93% 95%  96%  Weight: 72.2 kg     Height:        Intake/Output Summary (Last 24 hours) at 04/29/2024 1125 Last data filed at 04/29/2024 1027 Gross per 24 hour  Intake 60 ml  Output --  Net 60 ml   Filed Weights   04/28/24 1612 04/28/24 2302  Weight: 73.9 kg 72.2 kg    Examination:  General exam: Appears calm and comfortable  Respiratory system: Clear to auscultation. Respiratory effort normal. Cardiovascular system: S1 & S2 heard, RRR.  Gastrointestinal system: Abdomen is soft Central nervous system: Alert and awake Extremities: No edema Skin: No significant lesions noted Psychiatry: Flat affect.    Data Reviewed: I have personally reviewed following labs and imaging studies  CBC: Recent Labs  Lab 04/28/24 1655 04/29/24 0444  WBC 9.7 10.2  NEUTROABS 7.2  --   HGB 17.2* 15.7*  HCT 49.2* 46.3*  MCV 92.3 93.7  PLT 208 183   Basic  Metabolic Panel: Recent Labs  Lab 04/28/24 1655 04/29/24 0444  NA 134* 133*  K 3.6 3.0*  CL 94* 100  CO2 25 22  GLUCOSE 285* 174*  BUN 10 11  CREATININE 0.55 0.39*  CALCIUM  9.3 8.2*  MG  --  1.7  PHOS  --  2.4*   GFR: Estimated Creatinine Clearance: 89.9 mL/min (A) (by C-G formula based on SCr of 0.39 mg/dL (L)). Liver Function Tests: Recent Labs  Lab  04/28/24 1655 04/29/24 0444  AST 22 16  ALT 15 12  ALKPHOS 123 103  BILITOT 1.3* 1.1  PROT 7.9 6.8  ALBUMIN 3.9 3.4*   Recent Labs  Lab 04/28/24 1655  LIPASE 115*   No results for input(s): AMMONIA  in the last 168 hours. Coagulation Profile: No results for input(s): INR, PROTIME in the last 168 hours. Cardiac Enzymes: No results for input(s): CKTOTAL, CKMB, CKMBINDEX, TROPONINI in the last 168 hours. BNP (last 3 results) No results for input(s): PROBNP in the last 8760 hours. HbA1C: No results for input(s): HGBA1C in the last 72 hours. CBG: Recent Labs  Lab 04/28/24 2303 04/29/24 0738  GLUCAP 262* 253*   Lipid Profile: No results for input(s): CHOL, HDL, LDLCALC, TRIG, CHOLHDL, LDLDIRECT in the last 72 hours. Thyroid  Function Tests: No results for input(s): TSH, T4TOTAL, FREET4, T3FREE, THYROIDAB in the last 72 hours. Anemia Panel: No results for input(s): VITAMINB12, FOLATE, FERRITIN, TIBC, IRON, RETICCTPCT in the last 72 hours. Sepsis Labs: No results for input(s): PROCALCITON, LATICACIDVEN in the last 168 hours.  No results found for this or any previous visit (from the past 240 hours).       Radiology Studies: US  Abdomen Limited Result Date: 04/29/2024 CLINICAL DATA:  Acute on chronic pancreatitis. EXAM: ULTRASOUND ABDOMEN LIMITED RIGHT UPPER QUADRANT COMPARISON:  CT abdomen/pelvis 04/28/2024, right upper quadrant ultrasound 03/26/2024 FINDINGS: Gallbladder: Minimal gallbladder sludge. No gallstones or significant gallbladder wall thickening. Negative sonographic Murphy sign. Common bile duct: Diameter: 3.8 mm. Liver: Focal fatty infiltration over the central liver. No focal mass. Portal vein is patent on color Doppler imaging with normal direction of blood flow towards the liver. Other: Visualized portions of the pancreas demonstrate slight decreased echogenicity of the pancreas which may be due to edema in  the setting of acute pancreatitis as suspected in this patient per recent CT. IMPRESSION: 1. Minimal gallbladder sludge. No evidence of cholelithiasis or acute cholecystitis. 2. Focal fatty infiltration over the central liver. 3. Slight decreased echogenicity of the pancreas which may be due to edema in the setting of acute pancreatitis as suspected in this patient per recent CT. Electronically Signed   By: Toribio Agreste M.D.   On: 04/29/2024 10:28   CT ABDOMEN PELVIS W CONTRAST Result Date: 04/28/2024 CLINICAL DATA:  Epigastric pain. EXAM: CT ABDOMEN AND PELVIS WITH CONTRAST TECHNIQUE: Multidetector CT imaging of the abdomen and pelvis was performed using the standard protocol following bolus administration of intravenous contrast. RADIATION DOSE REDUCTION: This exam was performed according to the departmental dose-optimization program which includes automated exposure control, adjustment of the mA and/or kV according to patient size and/or use of iterative reconstruction technique. CONTRAST:  OMNIPAQUE  IOHEXOL  300 MG/ML  SOLN COMPARISON:  CT abdomen and pelvis 03/25/2024. FINDINGS: Lower chest: No acute abnormality. Hepatobiliary: There is diffuse fatty infiltration of the liver. Gallbladder and bile ducts are within normal limits. Pancreas: There is mild diffuse peripancreatic fat stranding. Pancreatic tail calcifications are again seen. There are 2 cystic structures in the tail  of the pancreas measuring 2.3 cm and 1.8 cm similar to the prior examination. No new fluid collections are identified. Spleen: Exophytic subcapsular fluid collection measuring 9.4 x 6.4 cm appears unchanged. Underlying spleen is within normal limits. Adrenals/Urinary Tract: Adrenal glands are unremarkable. Kidneys are normal, without renal calculi, focal lesion, or hydronephrosis. Bladder is unremarkable. Stomach/Bowel: Stomach is within normal limits. Appendix appears normal. No evidence of bowel wall thickening, distention, or  inflammatory changes. There is sigmoid colon diverticulosis. Vascular/Lymphatic: No significant vascular findings are present. No enlarged abdominal or pelvic lymph nodes. Reproductive: Uterus and bilateral adnexa are unremarkable. Other: No abdominal wall hernia or abnormality. No abdominopelvic ascites. Musculoskeletal: No acute or significant osseous findings. IMPRESSION: 1. Findings compatible with acute on chronic pancreatitis. 2. Stable cystic structures in the tail of the pancreas, likely pseudocysts. 3. Stable subcapsular splenic fluid collection. 4. Fatty infiltration of the liver. 5. Sigmoid colon diverticulosis. Electronically Signed   By: Greig Pique M.D.   On: 04/28/2024 18:57        Scheduled Meds:  enoxaparin  (LOVENOX ) injection  40 mg Subcutaneous Daily   insulin  aspart  0-15 Units Subcutaneous TID WC   insulin  aspart  0-5 Units Subcutaneous QHS   insulin  glargine  15 Units Subcutaneous QHS   lipase/protease/amylase  72,000 Units Oral TID WC   lisinopril   40 mg Oral Daily   pantoprazole  (PROTONIX ) IV  40 mg Intravenous QHS    LOS: 1 day    Time spent: 55 minutes    Jillian Kester JONETTA Fairly, DO [facility]  If 7PM-7AM, please contact night-coverage [email or web address] 04/29/2024, 11:25 AM

## 2024-04-29 NOTE — Inpatient Diabetes Management (Signed)
 Inpatient Diabetes Program Recommendations  AACE/ADA: New Consensus Statement on Inpatient Glycemic Control (2015)  Target Ranges:  Prepandial:   less than 140 mg/dL      Peak postprandial:   less than 180 mg/dL (1-2 hours)      Critically ill patients:  140 - 180 mg/dL   Lab Results  Component Value Date   GLUCAP 253 (H) 04/29/2024   HGBA1C 12.5 (H) 01/16/2024    Review of Glycemic Control  Latest Reference Range & Units 04/28/24 23:03 04/29/24 07:38  Glucose-Capillary 70 - 99 mg/dL 737 (H) 746 (H)   Diabetes history: DM 2 Outpatient Diabetes medications:  Lantus  20 units daily (med rec states taking 2-3 times daily?) Actos  30 mg daily Current orders for Inpatient glycemic control:  Novolog  0-15 units tid with meals and HS Semglee  10 units q HS Inpatient Diabetes Program Recommendations:    Note A1C markedly elevated in June of 2025. Consider recheck of A1C.  Also consider increasing Semglee  to 15 units daily.   Thanks,  Randall Bullocks, RN, BC-ADM Inpatient Diabetes Coordinator Pager [phone]  (8a-5p)

## 2024-04-29 NOTE — TOC Initial Note (Signed)
 Transition of Care [facility]) - Initial/Assessment Note    Patient Details  Name: Jillian Shepherd MRN: [id number] Date of Birth: 08/26/1985  Transition of Care [facility]) CM/SW Contact:    Sharlyne Stabs, RN Phone Number: 04/29/2024, 2:59 PM  Clinical Narrative:    Patient admitted with Acute on chronic pancreatitis. Considered to be a high risk for readmission. CM at the bedside. Patient states she lives a home with her spouse and children. She is independent with ADL's, cooks and cleaning. No DME needed. She drives herself to appointments. Substance abuse recourses added to AVS for patient to review. She is cutting back on Alcohol. Discharge planning for 2 days.          Expected Discharge Plan: Home/Self Care Barriers to Discharge: Continued Medical Work up   Patient Goals and CMS Choice Patient states their goals for this hospitalization and ongoing recovery are:: Return Home CMS Medicare.gov Compare Post Acute Care list provided to:: Patient Choice offered to / list presented to : Patient [place] ownership interest in [facility].provided to:: Patient    Expected Discharge Plan and Services       Living arrangements for the past 2 months: Single Family Home                                      Prior Living Arrangements/Services Living arrangements for the past 2 months: Single Family Home Lives with:: Spouse, Minor Children Patient language and need for interpreter reviewed:: Yes Do you feel safe going back to the place where you live?: Yes      Need for Family Participation in Patient Care: Yes (Comment) Care giver support system in place?: Yes (comment)   Criminal Activity/Legal Involvement Pertinent to Current Situation/Hospitalization: No - Comment as needed  Activities of Daily Living   ADL Screening (condition at time of admission) Independently performs ADLs?: Yes (appropriate for developmental age) Is the patient deaf or have difficulty  hearing?: No Does the patient have difficulty seeing, even when wearing glasses/contacts?: No Does the patient have difficulty concentrating, remembering, or making decisions?: No  Permission Sought/Granted                  Emotional Assessment     Affect (typically observed): Accepting Orientation: : Oriented to Self, Oriented to Place, Oriented to  Time, Oriented to Situation Alcohol / Substance Use: Alcohol Use Psych Involvement: No (comment)  Admission diagnosis:  Alcohol use [F10.90] Intractable pain [R52] Acute on chronic pancreatitis (HCC) [K85.90, K86.1] Patient Active Problem List   Diagnosis Date Noted   Tobacco abuse 04/28/2024   Hypocalcemia 03/25/2024   Type 2 diabetes mellitus with hyperglycemia (HCC) 02/01/2024   UTI (urinary tract infection) 02/01/2024   GERD (gastroesophageal reflux disease) 04/05/2023   Elevated lipase 04/05/2023   Chronic alcohol use 04/05/2023   Pseudocyst of pancreas due to acute pancreatitis 04/05/2023   PICC line infection, initial encounter 11/07/2022   Abdominal pain, epigastric 08/19/2022   Acute recurrent pancreatitis 08/15/2022   Pancreatitis, necrotizing 08/15/2022   Cavernous transformation of portal vein 07/24/2022   Leukocytosis 07/21/2022   Chronic pancreatitis (HCC) 07/18/2022   Dehydration 07/07/2022   Diabetes mellitus type II, controlled (HCC) 07/01/2022   Acute on chronic pancreatitis 06/30/2022   Malnutrition of moderate degree 06/12/2022   Pancreatic pseudocyst 06/11/2022   Acute hypoxic respiratory failure (HCC) 06/10/2022   Pleural effusion on left 06/10/2022  Spleen hematoma 06/08/2022   Acute pancreatitis 05/31/2022   History of splenic vein thrombosis 05/31/2022   Liddle's syndrome 05/31/2022   Hypoxia 05/11/2022   Abdominal pain 05/10/2022   Nausea & vomiting 05/10/2022   Hypokalemia 05/10/2022   Hypomagnesemia 05/10/2022   Prolonged QT interval 05/10/2022   Hyperglycemia 05/10/2022   Alcohol  abuse 05/10/2022   Acute alcoholic pancreatitis 05/10/2022   Diarrhea    Pancreatitis, acute 07/27/2020   Sacral back pain 09/14/2019   Bilateral ovarian cysts 09/02/2018   Elevated blood pressure reading without diagnosis of hypertension 01/20/2015   Essential hypertension 07/07/2014   Depression 07/07/2014   Obesity 07/07/2014   PCP:  Duanne Butler DASEN, MD Pharmacy:   [id number] - [place], [place] - [address] AT [facility] [address] [place] [address] Phone: [phone] Fax: [phone]     Social Drivers of Health (SDOH) Social History: SDOH Screenings   Food Insecurity: No Food Insecurity (04/28/2024)  Housing: Low Risk  (04/28/2024)  Recent Concern: Housing - High Risk (03/26/2024)  Transportation Needs: No Transportation Needs (04/28/2024)  Utilities: Not At Risk (04/28/2024)  Depression (PHQ2-9): Low Risk  (01/16/2024)  Social Connections: Socially Integrated (02/01/2024)  Tobacco Use: High Risk (04/28/2024)   SDOH Interventions:     Readmission Risk Interventions    04/29/2024    2:58 PM 03/27/2024    9:58 AM 03/26/2024   12:04 PM  Readmission Risk Prevention Plan  Transportation Screening Complete Complete Complete  PCP or Specialist Appt within 3-5 Days Not Complete    Home Care Screening   Complete  Medication Review (RN CM)   Complete  HRI or Home Care Consult Complete Complete   Social Work Consult for Recovery Care Planning/Counseling Complete Complete   Palliative Care Screening Not Applicable Not Applicable   Medication Review [employer]) Complete Complete

## 2024-04-29 NOTE — Plan of Care (Signed)
   Problem: Education: Goal: Knowledge of General Education information will improve Description Including pain rating scale, medication(s)/side effects and non-pharmacologic comfort measures Outcome: Progressing   Problem: Health Behavior/Discharge Planning: Goal: Ability to manage health-related needs will improve Outcome: Progressing

## 2024-04-30 DIAGNOSIS — K861 Other chronic pancreatitis: Secondary | ICD-10-CM | POA: Diagnosis not present

## 2024-04-30 DIAGNOSIS — K859 Acute pancreatitis without necrosis or infection, unspecified: Secondary | ICD-10-CM | POA: Diagnosis not present

## 2024-04-30 LAB — CBC
HCT: 50.7 % — ABNORMAL HIGH (ref 36.0–46.0)
Hemoglobin: 16.9 g/dL — ABNORMAL HIGH (ref 12.0–15.0)
MCH: 31.6 pg (ref 26.0–34.0)
MCHC: 33.3 g/dL (ref 30.0–36.0)
MCV: 94.8 fL (ref 80.0–100.0)
Platelets: 200 K/uL (ref 150–400)
RBC: 5.35 MIL/uL — ABNORMAL HIGH (ref 3.87–5.11)
RDW: 15.1 % (ref 11.5–15.5)
WBC: 14.6 K/uL — ABNORMAL HIGH (ref 4.0–10.5)
nRBC: 0 % (ref 0.0–0.2)

## 2024-04-30 LAB — MAGNESIUM: Magnesium: 1.8 mg/dL (ref 1.7–2.4)

## 2024-04-30 LAB — COMPREHENSIVE METABOLIC PANEL WITH GFR
ALT: 10 U/L (ref 0–44)
AST: 18 U/L (ref 15–41)
Albumin: 3.6 g/dL (ref 3.5–5.0)
Alkaline Phosphatase: 106 U/L (ref 38–126)
Anion gap: 11 (ref 5–15)
BUN: 5 mg/dL — ABNORMAL LOW (ref 6–20)
CO2: 23 mmol/L (ref 22–32)
Calcium: 8.9 mg/dL (ref 8.9–10.3)
Chloride: 97 mmol/L — ABNORMAL LOW (ref 98–111)
Creatinine, Ser: 0.47 mg/dL (ref 0.44–1.00)
GFR, Estimated: >60 mL/min (ref >60)
Glucose, Bld: 172 mg/dL — ABNORMAL HIGH (ref 70–99)
Potassium: 3.8 mmol/L (ref 3.5–5.1)
Sodium: 131 mmol/L — ABNORMAL LOW (ref 135–145)
Total Bilirubin: 1.5 mg/dL — ABNORMAL HIGH (ref 0.0–1.2)
Total Protein: 7.8 g/dL (ref 6.5–8.1)

## 2024-04-30 LAB — GLUCOSE, CAPILLARY
Glucose-Capillary: 178 mg/dL — ABNORMAL HIGH (ref 70–99)
Glucose-Capillary: 163 mg/dL — ABNORMAL HIGH (ref 70–99)
Glucose-Capillary: 161 mg/dL — ABNORMAL HIGH (ref 70–99)
Glucose-Capillary: 210 mg/dL — ABNORMAL HIGH (ref 70–99)

## 2024-04-30 MED ORDER — OXYCODONE HCL 5 MG PO TABS
10.0000 mg | ORAL_TABLET | ORAL | Status: DC | PRN
Start: 2024-04-30 — End: 2024-05-03
  Administered 2024-04-30 – 2024-05-03 (×11): 10 mg via ORAL
  Filled 2024-04-30 (×14): qty 2

## 2024-04-30 MED ORDER — SODIUM CHLORIDE 0.9 % IV SOLN: INTRAVENOUS | Status: AC

## 2024-04-30 MED ORDER — OXYCODONE HCL 5 MG PO TABS
5.0000 mg | ORAL_TABLET | Freq: Once | ORAL | Status: AC
  Administered 2024-04-30: 5 mg via ORAL
  Filled 2024-04-30: qty 1

## 2024-04-30 NOTE — Plan of Care (Signed)
   Problem: Education: Goal: Knowledge of General Education information will improve Description Including pain rating scale, medication(s)/side effects and non-pharmacologic comfort measures Outcome: Progressing   Problem: Health Behavior/Discharge Planning: Goal: Ability to manage health-related needs will improve Outcome: Progressing

## 2024-04-30 NOTE — Progress Notes (Signed)
 PROGRESS NOTE    Jillian Shepherd  FMW:[id number] DOB: 03/02/1986 DOA: 04/28/2024 PCP: Duanne Butler DASEN, MD   Brief Narrative:    Jillian Shepherd is a 38 y.o. female with medical history significant of Liddle syndrome, alcoholic chronic pancreatitis, recurrent pancreatitis with pseudocyst formation, recurrent admissions for same, alcohol use disorder, tobacco abuse, type II DM, hypertension who presents to the emergency department due to epigastric abdominal pain associated with nausea and vomiting which started today.  Patient has been admitted for recurrent acute on chronic pancreatitis and is requiring IV pain medications.  Assessment & Plan:   Principal Problem:   Acute on chronic pancreatitis Active Problems:   Nausea & vomiting   Essential hypertension   Prolonged QT interval   GERD (gastroesophageal reflux disease)   Type 2 diabetes mellitus with hyperglycemia (HCC)   Tobacco abuse  Assessment and Plan:   Acute on chronic pancreatitis Continue IV Compazine  p.r.n Continue IV Dilaudid  0.5 mg every 3 hours p.r.n for pain Added oxycodone  10 mg as needed for moderate pain and breakthrough on 9/25 Continue Protonix  Continue Creon  Continue IV NS at 125 mL/h Continue clear liquid diet RUQ U/S with minimal sludge with no cholelithiasis or cholecystitis   Nausea and vomiting Continue Compazine  as needed   Hyponatremia Started on IV NS, continue to monitor   Type 2 diabetes mellitus with hyperglycemia Hemoglobin A1c on 01/16/2023 was 12.5 Semglee  adjusted to 15 units daily and A1c ordered Continue ISS and hypoglycemia protocol   Essential hypertension Continue lisinopril  With blood pressure elevations, will add IV hydralazine    GERD Continue Protonix    Alcohol abuse Last alcohol intake was on Saturday (9/20) Patient denies any alcohol withdrawal symptoms She was counseled on alcohol abuse cessation   Tobacco abuse Patient is a 17-year history of tobacco  abuse She states that she quit smoking about 3 weeks ago    DVT prophylaxis:Lovenox  Code Status: Full Family Communication: None at bedside Disposition Plan:  Status is: Inpatient Remains inpatient appropriate because: Need for ongoing IV medications  Consultants:  None  Procedures:  None  Antimicrobials:  None   Subjective: Patient seen and evaluated today with continued severe pain requiring frequent doses of IV Dilaudid .  She is stating that the pain medication seems to wear off fairly quickly.  She is not tolerating much of her diet and continues to have some nausea but no vomiting.  Objective: Vitals:   04/29/24 2116 04/30/24 0505 04/30/24 0700 04/30/24 0817  BP: (!) 172/95 (!) 171/115 (!) 163/118 (!) 170/102  Pulse: 89 95    Resp: 19     Temp: 97.9 F (36.6 C) 97.9 F (36.6 C)    TempSrc: Oral Oral    SpO2: 95% 95%    Weight:      Height:        Intake/Output Summary (Last 24 hours) at 04/30/2024 1216 Last data filed at 04/30/2024 9061 Gross per 24 hour  Intake 1432.39 ml  Output --  Net 1432.39 ml   Filed Weights   04/28/24 1612 04/28/24 2302  Weight: 73.9 kg 72.2 kg    Examination:  General exam: Appears calm and comfortable  Respiratory system: Clear to auscultation. Respiratory effort normal. Cardiovascular system: S1 & S2 heard, RRR.  Gastrointestinal system: Abdomen is soft Central nervous system: Alert and awake Extremities: No edema Skin: No significant lesions noted Psychiatry: Flat affect.    Data Reviewed: I have personally reviewed following labs and imaging studies  CBC: Recent Labs  Lab 04/28/24  1655 04/29/24 0444 04/30/24 0419  WBC 9.7 10.2 14.6*  NEUTROABS 7.2  --   --   HGB 17.2* 15.7* 16.9*  HCT 49.2* 46.3* 50.7*  MCV 92.3 93.7 94.8  PLT 208 183 200   Basic Metabolic Panel: Recent Labs  Lab 04/28/24 1655 04/29/24 0444 04/30/24 0419  NA 134* 133* 131*  K 3.6 3.0* 3.8  CL 94* 100 97*  CO2 25 22 23   GLUCOSE  285* 174* 172*  BUN 10 11 5*  CREATININE 0.55 0.39* 0.47  CALCIUM  9.3 8.2* 8.9  MG  --  1.7 1.8  PHOS  --  2.4*  --    GFR: Estimated Creatinine Clearance: 89.9 mL/min (by C-G formula based on SCr of 0.47 mg/dL). Liver Function Tests: Recent Labs  Lab 04/28/24 1655 04/29/24 0444 04/30/24 0419  AST 22 16 18   ALT 15 12 10   ALKPHOS 123 103 106  BILITOT 1.3* 1.1 1.5*  PROT 7.9 6.8 7.8  ALBUMIN 3.9 3.4* 3.6   Recent Labs  Lab 04/28/24 1655  LIPASE 115*   No results for input(s): AMMONIA  in the last 168 hours. Coagulation Profile: No results for input(s): INR, PROTIME in the last 168 hours. Cardiac Enzymes: No results for input(s): CKTOTAL, CKMB, CKMBINDEX, TROPONINI in the last 168 hours. BNP (last 3 results) No results for input(s): PROBNP in the last 8760 hours. HbA1C: Recent Labs    04/29/24 0527  HGBA1C 11.1*   CBG: Recent Labs  Lab 04/29/24 1131 04/29/24 1631 04/29/24 2118 04/30/24 0727 04/30/24 1110  GLUCAP 179* 234* 221* 178* 163*   Lipid Profile: No results for input(s): CHOL, HDL, LDLCALC, TRIG, CHOLHDL, LDLDIRECT in the last 72 hours. Thyroid  Function Tests: No results for input(s): TSH, T4TOTAL, FREET4, T3FREE, THYROIDAB in the last 72 hours. Anemia Panel: No results for input(s): VITAMINB12, FOLATE, FERRITIN, TIBC, IRON, RETICCTPCT in the last 72 hours. Sepsis Labs: No results for input(s): PROCALCITON, LATICACIDVEN in the last 168 hours.  No results found for this or any previous visit (from the past 240 hours).       Radiology Studies: US  Abdomen Limited Result Date: 04/29/2024 CLINICAL DATA:  Acute on chronic pancreatitis. EXAM: ULTRASOUND ABDOMEN LIMITED RIGHT UPPER QUADRANT COMPARISON:  CT abdomen/pelvis 04/28/2024, right upper quadrant ultrasound 03/26/2024 FINDINGS: Gallbladder: Minimal gallbladder sludge. No gallstones or significant gallbladder wall thickening. Negative sonographic  Murphy sign. Common bile duct: Diameter: 3.8 mm. Liver: Focal fatty infiltration over the central liver. No focal mass. Portal vein is patent on color Doppler imaging with normal direction of blood flow towards the liver. Other: Visualized portions of the pancreas demonstrate slight decreased echogenicity of the pancreas which may be due to edema in the setting of acute pancreatitis as suspected in this patient per recent CT. IMPRESSION: 1. Minimal gallbladder sludge. No evidence of cholelithiasis or acute cholecystitis. 2. Focal fatty infiltration over the central liver. 3. Slight decreased echogenicity of the pancreas which may be due to edema in the setting of acute pancreatitis as suspected in this patient per recent CT. Electronically Signed   By: Toribio Agreste M.D.   On: 04/29/2024 10:28   CT ABDOMEN PELVIS W CONTRAST Result Date: 04/28/2024 CLINICAL DATA:  Epigastric pain. EXAM: CT ABDOMEN AND PELVIS WITH CONTRAST TECHNIQUE: Multidetector CT imaging of the abdomen and pelvis was performed using the standard protocol following bolus administration of intravenous contrast. RADIATION DOSE REDUCTION: This exam was performed according to the departmental dose-optimization program which includes automated exposure control, adjustment of the mA  and/or kV according to patient size and/or use of iterative reconstruction technique. CONTRAST:  OMNIPAQUE  IOHEXOL  300 MG/ML  SOLN COMPARISON:  CT abdomen and pelvis 03/25/2024. FINDINGS: Lower chest: No acute abnormality. Hepatobiliary: There is diffuse fatty infiltration of the liver. Gallbladder and bile ducts are within normal limits. Pancreas: There is mild diffuse peripancreatic fat stranding. Pancreatic tail calcifications are again seen. There are 2 cystic structures in the tail of the pancreas measuring 2.3 cm and 1.8 cm similar to the prior examination. No new fluid collections are identified. Spleen: Exophytic subcapsular fluid collection measuring 9.4 x  6.4 cm appears unchanged. Underlying spleen is within normal limits. Adrenals/Urinary Tract: Adrenal glands are unremarkable. Kidneys are normal, without renal calculi, focal lesion, or hydronephrosis. Bladder is unremarkable. Stomach/Bowel: Stomach is within normal limits. Appendix appears normal. No evidence of bowel wall thickening, distention, or inflammatory changes. There is sigmoid colon diverticulosis. Vascular/Lymphatic: No significant vascular findings are present. No enlarged abdominal or pelvic lymph nodes. Reproductive: Uterus and bilateral adnexa are unremarkable. Other: No abdominal wall hernia or abnormality. No abdominopelvic ascites. Musculoskeletal: No acute or significant osseous findings. IMPRESSION: 1. Findings compatible with acute on chronic pancreatitis. 2. Stable cystic structures in the tail of the pancreas, likely pseudocysts. 3. Stable subcapsular splenic fluid collection. 4. Fatty infiltration of the liver. 5. Sigmoid colon diverticulosis. Electronically Signed   By: Greig Pique M.D.   On: 04/28/2024 18:57        Scheduled Meds:  enoxaparin  (LOVENOX ) injection  40 mg Subcutaneous Daily   insulin  aspart  0-15 Units Subcutaneous TID WC   insulin  aspart  0-5 Units Subcutaneous QHS   insulin  glargine  15 Units Subcutaneous QHS   lipase/protease/amylase  72,000 Units Oral TID WC   lisinopril   40 mg Oral Daily   pantoprazole  (PROTONIX ) IV  40 mg Intravenous QHS    LOS: 2 days    Time spent: 55 minutes    Toure Edmonds JONETTA Fairly, DO [facility]  If 7PM-7AM, please contact night-coverage [email or web address] 04/30/2024, 12:16 PM

## 2024-04-30 NOTE — Inpatient Diabetes Management (Signed)
 Inpatient Diabetes Program Recommendations  AACE/ADA: New Consensus Statement on Inpatient Glycemic Control (2015)  Target Ranges:  Prepandial:   less than 140 mg/dL      Peak postprandial:   less than 180 mg/dL (1-2 hours)      Critically ill patients:  140 - 180 mg/dL   Lab Results  Component Value Date   GLUCAP 163 (H) 04/30/2024   HGBA1C 11.1 (H) 04/29/2024    Review of Glycemic Control  Latest Reference Range & Units 04/29/24 07:38 04/29/24 11:31 04/29/24 16:31 04/29/24 21:18 04/30/24 07:27 04/30/24 11:10  Glucose-Capillary 70 - 99 mg/dL 746 (H) 820 (H) 765 (H) 221 (H) 178 (H) 163 (H)   Diabetes history: DM 2 Outpatient Diabetes medications:  Lantus  20 units daily (med rec states taking 2-3 times daily?) Actos  30 mg daily Current orders for Inpatient glycemic control:  Novolog  0-15 units tid with meals and HS Semglee  10 units q HS Inpatient Diabetes Program Recommendations:    Note A1C slightly lower at 11.1% this admission  Spoke with pt over the phone regarding A1c level and glucose control at home. Pt reports using a fingerstick meter at least 3 times a day and usually sees glucose trends 190-220's. Pt reports she takes lantus  20 units daily and at lunch and dinner if her glucose trends are elevated she takes an additional 10-20 units. Discussed the profile of lantus  insulin . Pt reports never being on short acting insulin  during the day. Suggested for pt to think about the addition of utilizing short acting insulin  during the day to cover meals instead of lantus . Pt reports not having hypoglycemia. Insurance no longer covers the [employer]. Pt to follow up with PCP and continue to work on lowering A1c level. Discussed glucose and A1c goals.  Thanks,  Clotilda Bull RN, MSN, BC-ADM Inpatient Diabetes Coordinator Team Pager [phone] (8a-5p)

## 2024-05-01 DIAGNOSIS — K861 Other chronic pancreatitis: Secondary | ICD-10-CM | POA: Diagnosis not present

## 2024-05-01 DIAGNOSIS — K859 Acute pancreatitis without necrosis or infection, unspecified: Secondary | ICD-10-CM | POA: Diagnosis not present

## 2024-05-01 LAB — CBC
HCT: 45.9 % (ref 36.0–46.0)
Hemoglobin: 15.6 g/dL — ABNORMAL HIGH (ref 12.0–15.0)
MCH: 31.6 pg (ref 26.0–34.0)
MCHC: 34 g/dL (ref 30.0–36.0)
MCV: 93.1 fL (ref 80.0–100.0)
Platelets: 199 K/uL (ref 150–400)
RBC: 4.93 MIL/uL (ref 3.87–5.11)
RDW: 15.2 % (ref 11.5–15.5)
WBC: 11.5 K/uL — ABNORMAL HIGH (ref 4.0–10.5)
nRBC: 0 % (ref 0.0–0.2)

## 2024-05-01 LAB — COMPREHENSIVE METABOLIC PANEL WITH GFR
ALT: 10 U/L (ref 0–44)
AST: 12 U/L — ABNORMAL LOW (ref 15–41)
Albumin: 3.1 g/dL — ABNORMAL LOW (ref 3.5–5.0)
Alkaline Phosphatase: 87 U/L (ref 38–126)
Anion gap: 9 (ref 5–15)
BUN: 6 mg/dL (ref 6–20)
CO2: 22 mmol/L (ref 22–32)
Calcium: 8.6 mg/dL — ABNORMAL LOW (ref 8.9–10.3)
Chloride: 101 mmol/L (ref 98–111)
Creatinine, Ser: 0.34 mg/dL — ABNORMAL LOW (ref 0.44–1.00)
GFR, Estimated: >60 mL/min (ref >60)
Glucose, Bld: 119 mg/dL — ABNORMAL HIGH (ref 70–99)
Potassium: 2.9 mmol/L — ABNORMAL LOW (ref 3.5–5.1)
Sodium: 132 mmol/L — ABNORMAL LOW (ref 135–145)
Total Bilirubin: 1.6 mg/dL — ABNORMAL HIGH (ref 0.0–1.2)
Total Protein: 6.9 g/dL (ref 6.5–8.1)

## 2024-05-01 LAB — MAGNESIUM: Magnesium: 1.7 mg/dL (ref 1.7–2.4)

## 2024-05-01 LAB — GLUCOSE, CAPILLARY
Glucose-Capillary: 129 mg/dL — ABNORMAL HIGH (ref 70–99)
Glucose-Capillary: 99 mg/dL (ref 70–99)
Glucose-Capillary: 115 mg/dL — ABNORMAL HIGH (ref 70–99)
Glucose-Capillary: 203 mg/dL — ABNORMAL HIGH (ref 70–99)

## 2024-05-01 MED ORDER — POTASSIUM CHLORIDE CRYS ER 20 MEQ PO TBCR
40.0000 meq | EXTENDED_RELEASE_TABLET | Freq: Once | ORAL | Status: AC
  Administered 2024-05-01: 40 meq via ORAL
  Filled 2024-05-01: qty 2

## 2024-05-01 MED ORDER — SODIUM CHLORIDE 0.9 % IV SOLN: INTRAVENOUS | Status: AC

## 2024-05-01 MED ORDER — POTASSIUM CHLORIDE 10 MEQ/100ML IV SOLN
10.0000 meq | INTRAVENOUS | Status: AC
  Administered 2024-05-01 (×4): 10 meq via INTRAVENOUS
  Filled 2024-05-01 (×4): qty 100

## 2024-05-01 NOTE — Progress Notes (Signed)
 PROGRESS NOTE    Jillian Shepherd  FMW:[id number] DOB: 1986-01-25 DOA: 04/28/2024 PCP: Duanne Butler DASEN, MD   Brief Narrative:    Jillian Shepherd is a 38 y.o. female with medical history significant of Liddle syndrome, alcoholic chronic pancreatitis, recurrent pancreatitis with pseudocyst formation, recurrent admissions for same, alcohol use disorder, tobacco abuse, type II DM, hypertension who presents to the emergency department due to epigastric abdominal pain associated with nausea and vomiting which started today.  Patient has been admitted for recurrent acute on chronic pancreatitis and is requiring IV pain medications.  Assessment & Plan:   Principal Problem:   Acute on chronic pancreatitis Active Problems:   Nausea & vomiting   Essential hypertension   Prolonged QT interval   GERD (gastroesophageal reflux disease)   Type 2 diabetes mellitus with hyperglycemia (HCC)   Tobacco abuse  Assessment and Plan:   Acute on chronic pancreatitis Continue IV Compazine  p.r.n Continue IV Dilaudid  0.5 mg every 3 hours p.r.n for pain Added oxycodone  10 mg as needed for moderate pain and breakthrough on 9/25 Continue Protonix  Continue Creon  Continue IV NS at 125 mL/h Continue clear liquid diet RUQ U/S with minimal sludge with no cholelithiasis or cholecystitis   Nausea and vomiting Continue Compazine  as needed  Hypokalemia Replete IV and p.o. and monitor   Hyponatremia-improving Started on IV NS, continue to monitor   Type 2 diabetes mellitus with hyperglycemia Hemoglobin A1c on 01/16/2023 was 12.5 Semglee  adjusted to 15 units daily and A1c ordered Continue ISS and hypoglycemia protocol   Essential hypertension Continue lisinopril  With blood pressure elevations, will add IV hydralazine    GERD Continue Protonix    Alcohol abuse Last alcohol intake was on Saturday (9/20) Patient denies any alcohol withdrawal symptoms She was counseled on alcohol abuse cessation    Tobacco abuse Patient is a 17-year history of tobacco abuse She states that she quit smoking about 3 weeks ago    DVT prophylaxis:Lovenox  Code Status: Full Family Communication: None at bedside Disposition Plan:  Status is: Inpatient Remains inpatient appropriate because: Need for ongoing IV medications  Consultants:  None  Procedures:  None  Antimicrobials:  None   Subjective: Patient seen and evaluated today with improvement in pain control noted today although she is still in quite a bit of pain.  She denies any nausea and vomiting and states that she is trying her clear liquid diet, but does not have much of an appetite.  Blood pressure is slowly improving.  Objective: Vitals:   04/30/24 1305 04/30/24 2042 05/01/24 0435 05/01/24 0934  BP: (!) 161/108 (!) 148/100 121/84 (!) 130/95  Pulse: 97 (!) 109 (!) 107   Resp: 20 19 15    Temp: 98.3 F (36.8 C) 98.2 F (36.8 C) 98.1 F (36.7 C)   TempSrc: Oral     SpO2: 96% 96% 96%   Weight:      Height:        Intake/Output Summary (Last 24 hours) at 05/01/2024 1012 Last data filed at 05/01/2024 0924 Gross per 24 hour  Intake 2440 ml  Output --  Net 2440 ml   Filed Weights   04/28/24 1612 04/28/24 2302  Weight: 73.9 kg 72.2 kg    Examination:  General exam: Appears calm and comfortable  Respiratory system: Clear to auscultation. Respiratory effort normal. Cardiovascular system: S1 & S2 heard, RRR.  Gastrointestinal system: Abdomen is soft Central nervous system: Alert and awake Extremities: No edema Skin: No significant lesions noted Psychiatry: Flat affect.  Data Reviewed: I have personally reviewed following labs and imaging studies  CBC: Recent Labs  Lab 04/28/24 1655 04/29/24 0444 04/30/24 0419 05/01/24 0434  WBC 9.7 10.2 14.6* 11.5*  NEUTROABS 7.2  --   --   --   HGB 17.2* 15.7* 16.9* 15.6*  HCT 49.2* 46.3* 50.7* 45.9  MCV 92.3 93.7 94.8 93.1  PLT 208 183 200 199   Basic Metabolic  Panel: Recent Labs  Lab 04/28/24 1655 04/29/24 0444 04/30/24 0419 05/01/24 0434  NA 134* 133* 131* 132*  K 3.6 3.0* 3.8 2.9*  CL 94* 100 97* 101  CO2 25 22 23 22   GLUCOSE 285* 174* 172* 119*  BUN 10 11 5* 6  CREATININE 0.55 0.39* 0.47 0.34*  CALCIUM  9.3 8.2* 8.9 8.6*  MG  --  1.7 1.8 1.7  PHOS  --  2.4*  --   --    GFR: Estimated Creatinine Clearance: 89.9 mL/min (A) (by C-G formula based on SCr of 0.34 mg/dL (L)). Liver Function Tests: Recent Labs  Lab 04/28/24 1655 04/29/24 0444 04/30/24 0419 05/01/24 0434  AST 22 16 18  12*  ALT 15 12 10 10   ALKPHOS 123 103 106 87  BILITOT 1.3* 1.1 1.5* 1.6*  PROT 7.9 6.8 7.8 6.9  ALBUMIN 3.9 3.4* 3.6 3.1*   Recent Labs  Lab 04/28/24 1655  LIPASE 115*   No results for input(s): AMMONIA  in the last 168 hours. Coagulation Profile: No results for input(s): INR, PROTIME in the last 168 hours. Cardiac Enzymes: No results for input(s): CKTOTAL, CKMB, CKMBINDEX, TROPONINI in the last 168 hours. BNP (last 3 results) No results for input(s): PROBNP in the last 8760 hours. HbA1C: Recent Labs    04/29/24 0527  HGBA1C 11.1*   CBG: Recent Labs  Lab 04/30/24 0727 04/30/24 1110 04/30/24 1625 04/30/24 2042 05/01/24 0709  GLUCAP 178* 163* 161* 210* 129*   Lipid Profile: No results for input(s): CHOL, HDL, LDLCALC, TRIG, CHOLHDL, LDLDIRECT in the last 72 hours. Thyroid  Function Tests: No results for input(s): TSH, T4TOTAL, FREET4, T3FREE, THYROIDAB in the last 72 hours. Anemia Panel: No results for input(s): VITAMINB12, FOLATE, FERRITIN, TIBC, IRON, RETICCTPCT in the last 72 hours. Sepsis Labs: No results for input(s): PROCALCITON, LATICACIDVEN in the last 168 hours.  No results found for this or any previous visit (from the past 240 hours).       Radiology Studies: No results found.       Scheduled Meds:  enoxaparin  (LOVENOX ) injection  40 mg Subcutaneous  Daily   insulin  aspart  0-15 Units Subcutaneous TID WC   insulin  aspart  0-5 Units Subcutaneous QHS   insulin  glargine  15 Units Subcutaneous QHS   lipase/protease/amylase  72,000 Units Oral TID WC   lisinopril   40 mg Oral Daily   pantoprazole  (PROTONIX ) IV  40 mg Intravenous QHS    LOS: 3 days    Time spent: 55 minutes    Aleighna Wojtas JONETTA Fairly, DO [facility]  If 7PM-7AM, please contact night-coverage [email or web address] 05/01/2024, 10:12 AM

## 2024-05-01 NOTE — Plan of Care (Signed)

## 2024-05-02 DIAGNOSIS — K859 Acute pancreatitis without necrosis or infection, unspecified: Secondary | ICD-10-CM | POA: Diagnosis not present

## 2024-05-02 DIAGNOSIS — K861 Other chronic pancreatitis: Secondary | ICD-10-CM | POA: Diagnosis not present

## 2024-05-02 LAB — GLUCOSE, CAPILLARY
Glucose-Capillary: 108 mg/dL — ABNORMAL HIGH (ref 70–99)
Glucose-Capillary: 184 mg/dL — ABNORMAL HIGH (ref 70–99)
Glucose-Capillary: 250 mg/dL — ABNORMAL HIGH (ref 70–99)
Glucose-Capillary: 89 mg/dL (ref 70–99)

## 2024-05-02 LAB — CBC
HCT: 45.6 % (ref 36.0–46.0)
Hemoglobin: 15.2 g/dL — ABNORMAL HIGH (ref 12.0–15.0)
MCH: 31.9 pg (ref 26.0–34.0)
MCHC: 33.3 g/dL (ref 30.0–36.0)
MCV: 95.6 fL (ref 80.0–100.0)
Platelets: 180 K/uL (ref 150–400)
RBC: 4.77 MIL/uL (ref 3.87–5.11)
RDW: 15.8 % — ABNORMAL HIGH (ref 11.5–15.5)
WBC: 7.3 K/uL (ref 4.0–10.5)
nRBC: 0 % (ref 0.0–0.2)

## 2024-05-02 LAB — COMPREHENSIVE METABOLIC PANEL WITH GFR
ALT: 12 U/L (ref 0–44)
AST: 25 U/L (ref 15–41)
Albumin: 2.8 g/dL — ABNORMAL LOW (ref 3.5–5.0)
Alkaline Phosphatase: 84 U/L (ref 38–126)
Anion gap: 8 (ref 5–15)
BUN: 6 mg/dL (ref 6–20)
CO2: 24 mmol/L (ref 22–32)
Calcium: 8.3 mg/dL — ABNORMAL LOW (ref 8.9–10.3)
Chloride: 103 mmol/L (ref 98–111)
Creatinine, Ser: 0.31 mg/dL — ABNORMAL LOW (ref 0.44–1.00)
GFR, Estimated: >60 mL/min (ref >60)
Glucose, Bld: 92 mg/dL (ref 70–99)
Potassium: 3.2 mmol/L — ABNORMAL LOW (ref 3.5–5.1)
Sodium: 135 mmol/L (ref 135–145)
Total Bilirubin: 1.6 mg/dL — ABNORMAL HIGH (ref 0.0–1.2)
Total Protein: 6.4 g/dL — ABNORMAL LOW (ref 6.5–8.1)

## 2024-05-02 LAB — MAGNESIUM: Magnesium: 1.6 mg/dL — ABNORMAL LOW (ref 1.7–2.4)

## 2024-05-02 MED ORDER — HYDROMORPHONE HCL 1 MG/ML IJ SOLN
0.5000 mg | Freq: Four times a day (QID) | INTRAMUSCULAR | Status: DC | PRN
  Administered 2024-05-02 (×2): 0.5 mg via INTRAVENOUS
  Filled 2024-05-02 (×2): qty 0.5

## 2024-05-02 MED ORDER — POTASSIUM CHLORIDE CRYS ER 20 MEQ PO TBCR
40.0000 meq | EXTENDED_RELEASE_TABLET | Freq: Two times a day (BID) | ORAL | Status: AC
  Administered 2024-05-02 (×2): 40 meq via ORAL
  Filled 2024-05-02 (×2): qty 2

## 2024-05-02 MED ORDER — MAGNESIUM SULFATE 2 GM/50ML IV SOLN
2.0000 g | Freq: Once | INTRAVENOUS | Status: AC
  Administered 2024-05-02: 2 g via INTRAVENOUS
  Filled 2024-05-02: qty 50

## 2024-05-02 NOTE — Progress Notes (Signed)
 PROGRESS NOTE    Jillian Shepherd  FMW:[id number] DOB: 07/22/1986 DOA: 04/28/2024 PCP: Duanne Butler DASEN, MD   Brief Narrative:    Jillian Shepherd is a 38 y.o. female with medical history significant of Liddle syndrome, alcoholic chronic pancreatitis, recurrent pancreatitis with pseudocyst formation, recurrent admissions for same, alcohol use disorder, tobacco abuse, type II DM, hypertension who presents to the emergency department due to epigastric abdominal pain associated with nausea and vomiting which started today.  Patient has been admitted for recurrent acute on chronic pancreatitis and is requiring IV pain medications.  Assessment & Plan:   Principal Problem:   Acute on chronic pancreatitis Active Problems:   Nausea & vomiting   Essential hypertension   Prolonged QT interval   GERD (gastroesophageal reflux disease)   Type 2 diabetes mellitus with hyperglycemia (HCC)   Tobacco abuse  Assessment and Plan:   Acute on chronic pancreatitis-improving Continue IV Compazine  p.r.n Continue IV Dilaudid  0.5 mg every 6 hours p.r.n for pain Added oxycodone  10 mg as needed for moderate pain and breakthrough on 9/25 Continue Protonix  Continue Creon  Continue IV NS at 75 mL/h Advance to full liquid diet RUQ U/S with minimal sludge with no cholelithiasis or cholecystitis   Nausea and vomiting-resolved Continue Compazine  as needed  Hypokalemia Repleat p.o. and monitor   Hyponatremia-resolved Started on IV NS, continue to monitor   Type 2 diabetes mellitus with hyperglycemia Hemoglobin A1c on 01/16/2023 was 12.5 Semglee  adjusted to 15 units daily and A1c ordered Continue ISS and hypoglycemia protocol   Essential hypertension Continue lisinopril  With blood pressure elevations, will add IV hydralazine    GERD Continue Protonix    Alcohol abuse Last alcohol intake was on Saturday (9/20) Patient denies any alcohol withdrawal symptoms She was counseled on alcohol abuse  cessation   Tobacco abuse Patient is a 17-year history of tobacco abuse She states that she quit smoking about 3 weeks ago    DVT prophylaxis:Lovenox  Code Status: Full Family Communication: None at bedside Disposition Plan:  Status is: Inpatient Remains inpatient appropriate because: Need for ongoing IV medications  Consultants:  None  Procedures:  None  Antimicrobials:  None   Subjective: Patient seen and evaluated today with improvement in pain control noted although she is still using quite a bit of IV Dilaudid .  She is agreeable to decreasing the frequency and advancing her diet today.  Objective: Vitals:   05/01/24 1605 05/01/24 2023 05/02/24 0537 05/02/24 1140  BP: (!) 140/97 (!) 121/93 (!) 134/93 106/83  Pulse:  (!) 101 81 80  Resp:  20 20   Temp:  98 F (36.7 C) 97.7 F (36.5 C)   TempSrc:  Oral Oral   SpO2:  98% 98%   Weight:      Height:        Intake/Output Summary (Last 24 hours) at 05/02/2024 1156 Last data filed at 05/02/2024 0207 Gross per 24 hour  Intake 3241.03 ml  Output --  Net 3241.03 ml   Filed Weights   04/28/24 1612 04/28/24 2302  Weight: 73.9 kg 72.2 kg    Examination:  General exam: Appears calm and comfortable  Respiratory system: Clear to auscultation. Respiratory effort normal. Cardiovascular system: S1 & S2 heard, RRR.  Gastrointestinal system: Abdomen is soft Central nervous system: Alert and awake Extremities: No edema Skin: No significant lesions noted Psychiatry: Flat affect.    Data Reviewed: I have personally reviewed following labs and imaging studies  CBC: Recent Labs  Lab 04/28/24 1655 04/29/24 0444 04/30/24 0419  05/01/24 0434 05/02/24 0428  WBC 9.7 10.2 14.6* 11.5* 7.3  NEUTROABS 7.2  --   --   --   --   HGB 17.2* 15.7* 16.9* 15.6* 15.2*  HCT 49.2* 46.3* 50.7* 45.9 45.6  MCV 92.3 93.7 94.8 93.1 95.6  PLT 208 183 200 199 180   Basic Metabolic Panel: Recent Labs  Lab 04/28/24 1655 04/29/24 0444  04/30/24 0419 05/01/24 0434 05/02/24 0428  NA 134* 133* 131* 132* 135  K 3.6 3.0* 3.8 2.9* 3.2*  CL 94* 100 97* 101 103  CO2 25 22 23 22 24   GLUCOSE 285* 174* 172* 119* 92  BUN 10 11 5* 6 6  CREATININE 0.55 0.39* 0.47 0.34* 0.31*  CALCIUM  9.3 8.2* 8.9 8.6* 8.3*  MG  --  1.7 1.8 1.7 1.6*  PHOS  --  2.4*  --   --   --    GFR: Estimated Creatinine Clearance: 89.9 mL/min (A) (by C-G formula based on SCr of 0.31 mg/dL (L)). Liver Function Tests: Recent Labs  Lab 04/28/24 1655 04/29/24 0444 04/30/24 0419 05/01/24 0434 05/02/24 0428  AST 22 16 18  12* 25  ALT 15 12 10 10 12   ALKPHOS 123 103 106 87 84  BILITOT 1.3* 1.1 1.5* 1.6* 1.6*  PROT 7.9 6.8 7.8 6.9 6.4*  ALBUMIN 3.9 3.4* 3.6 3.1* 2.8*   Recent Labs  Lab 04/28/24 1655  LIPASE 115*   No results for input(s): AMMONIA  in the last 168 hours. Coagulation Profile: No results for input(s): INR, PROTIME in the last 168 hours. Cardiac Enzymes: No results for input(s): CKTOTAL, CKMB, CKMBINDEX, TROPONINI in the last 168 hours. BNP (last 3 results) No results for input(s): PROBNP in the last 8760 hours. HbA1C: No results for input(s): HGBA1C in the last 72 hours.  CBG: Recent Labs  Lab 05/01/24 1114 05/01/24 1629 05/01/24 2010 05/02/24 0729 05/02/24 1125  GLUCAP 99 115* 203* 108* 184*   Lipid Profile: No results for input(s): CHOL, HDL, LDLCALC, TRIG, CHOLHDL, LDLDIRECT in the last 72 hours. Thyroid  Function Tests: No results for input(s): TSH, T4TOTAL, FREET4, T3FREE, THYROIDAB in the last 72 hours. Anemia Panel: No results for input(s): VITAMINB12, FOLATE, FERRITIN, TIBC, IRON, RETICCTPCT in the last 72 hours. Sepsis Labs: No results for input(s): PROCALCITON, LATICACIDVEN in the last 168 hours.  No results found for this or any previous visit (from the past 240 hours).       Radiology Studies: No results found.       Scheduled Meds:   enoxaparin  (LOVENOX ) injection  40 mg Subcutaneous Daily   insulin  aspart  0-15 Units Subcutaneous TID WC   insulin  aspart  0-5 Units Subcutaneous QHS   insulin  glargine  15 Units Subcutaneous QHS   lipase/protease/amylase  72,000 Units Oral TID WC   lisinopril   40 mg Oral Daily   pantoprazole  (PROTONIX ) IV  40 mg Intravenous QHS   potassium chloride   40 mEq Oral BID    LOS: 4 days    Time spent: 55 minutes    Ryelan Kazee JONETTA Fairly, DO [facility]  If 7PM-7AM, please contact night-coverage [email or web address] 05/02/2024, 11:56 AM

## 2024-05-02 NOTE — Plan of Care (Signed)

## 2024-05-03 DIAGNOSIS — K859 Acute pancreatitis without necrosis or infection, unspecified: Secondary | ICD-10-CM | POA: Diagnosis not present

## 2024-05-03 DIAGNOSIS — K861 Other chronic pancreatitis: Secondary | ICD-10-CM | POA: Diagnosis not present

## 2024-05-03 LAB — COMPREHENSIVE METABOLIC PANEL WITH GFR
ALT: 13 U/L (ref 0–44)
AST: 20 U/L (ref 15–41)
Albumin: 2.7 g/dL — ABNORMAL LOW (ref 3.5–5.0)
Alkaline Phosphatase: 83 U/L (ref 38–126)
Anion gap: 10 (ref 5–15)
BUN: 5 mg/dL — ABNORMAL LOW (ref 6–20)
CO2: 27 mmol/L (ref 22–32)
Calcium: 8.6 mg/dL — ABNORMAL LOW (ref 8.9–10.3)
Chloride: 103 mmol/L (ref 98–111)
Creatinine, Ser: 0.33 mg/dL — ABNORMAL LOW (ref 0.44–1.00)
GFR, Estimated: >60 mL/min (ref >60)
Glucose, Bld: 115 mg/dL — ABNORMAL HIGH (ref 70–99)
Potassium: 2.9 mmol/L — ABNORMAL LOW (ref 3.5–5.1)
Sodium: 140 mmol/L (ref 135–145)
Total Bilirubin: 0.8 mg/dL (ref 0.0–1.2)
Total Protein: 6.2 g/dL — ABNORMAL LOW (ref 6.5–8.1)

## 2024-05-03 LAB — CBC
HCT: 42.6 % (ref 36.0–46.0)
Hemoglobin: 14.2 g/dL (ref 12.0–15.0)
MCH: 31.6 pg (ref 26.0–34.0)
MCHC: 33.3 g/dL (ref 30.0–36.0)
MCV: 94.7 fL (ref 80.0–100.0)
Platelets: 207 K/uL (ref 150–400)
RBC: 4.5 MIL/uL (ref 3.87–5.11)
RDW: 15.5 % (ref 11.5–15.5)
WBC: 7 K/uL (ref 4.0–10.5)
nRBC: 0 % (ref 0.0–0.2)

## 2024-05-03 LAB — MAGNESIUM: Magnesium: 1.7 mg/dL (ref 1.7–2.4)

## 2024-05-03 LAB — GLUCOSE, CAPILLARY: Glucose-Capillary: 139 mg/dL — ABNORMAL HIGH (ref 70–99)

## 2024-05-03 MED ORDER — ONDANSETRON 8 MG PO TBDP: 8.0000 mg | ORAL_TABLET | Freq: Three times a day (TID) | ORAL | 0 refills | Status: DC | PRN

## 2024-05-03 MED ORDER — POTASSIUM CHLORIDE CRYS ER 20 MEQ PO TBCR
40.0000 meq | EXTENDED_RELEASE_TABLET | ORAL | Status: AC
  Administered 2024-05-03 (×2): 40 meq via ORAL
  Filled 2024-05-03 (×2): qty 2

## 2024-05-03 MED ORDER — MAGNESIUM SULFATE 2 GM/50ML IV SOLN
2.0000 g | Freq: Once | INTRAVENOUS | Status: AC
  Administered 2024-05-03: 2 g via INTRAVENOUS
  Filled 2024-05-03: qty 50

## 2024-05-03 MED ORDER — OXYCODONE HCL 5 MG PO TABS: 5.0000 mg | ORAL_TABLET | Freq: Four times a day (QID) | ORAL | 0 refills | Status: DC | PRN

## 2024-05-03 NOTE — Plan of Care (Signed)

## 2024-05-03 NOTE — Discharge Summary (Signed)
 Physician Discharge Summary  Katelen Luepke FMW:[id number] DOB: 1985/12/11 DOA: 04/28/2024  PCP: Duanne Butler DASEN, MD  Admit date: 04/28/2024  Discharge date: 05/03/2024  Admitted From:Home  Disposition:  Home  Recommendations for Outpatient Follow-up:  Follow up with PCP in 1-2 weeks Continue on oxycodone  as needed for pain control Zofran  prescribed as needed for nausea or vomiting Patient counseled on low-fat diet Continue other home medications as prior  Home Health: None  Equipment/Devices: None  Discharge Condition:Stable  CODE STATUS: Full  Diet recommendation: Heart Healthy/carb modified-low-fat  Brief/Interim Summary: Jillian Shepherd is a 38 y.o. female with medical history significant of Liddle syndrome, alcoholic chronic pancreatitis, recurrent pancreatitis with pseudocyst formation, recurrent admissions for same, alcohol use disorder, tobacco abuse, type II DM, hypertension who presented to the emergency department due to epigastric abdominal pain associated with nausea and vomiting on 9/23.  She was admitted for recurrent acute on chronic pancreatitis likely in the setting of poor diet with high fat intake.  She claims that she was eating hamburgers and other fatty foods that were fried that she should not have been eating.  She was started on aggressive IV fluid as well as IV pain medications and antiemetics and required several days of inpatient stay in order to further improve with dietary advancement and pain control.  No other acute events or concerns noted throughout the course of this admission and she is now in stable condition for discharge.  Discharge Diagnoses:  Principal Problem:   Acute on chronic pancreatitis Active Problems:   Nausea & vomiting   Essential hypertension   Prolonged QT interval   GERD (gastroesophageal reflux disease)   Type 2 diabetes mellitus with hyperglycemia (HCC)   Tobacco abuse  Principal discharge diagnosis: Acute on chronic  pancreatitis likely in the setting of high-fat diet.  Discharge Instructions  Discharge Instructions     Diet - low sodium heart healthy   Complete by: As directed    Increase activity slowly   Complete by: As directed       Allergies as of 05/03/2024   No Known Allergies      Medication List     TAKE these medications    Accu-Chek Guide Test test strip Generic drug: glucose blood USE IN THE MORNING, AT NOON, AND AT BEDTIME. MAY SUBSTITUTE TO ANY MANUFACTURER   Accu-Chek Softclix Lancets lancets USE IN THE MORNING, AT NOON, AND AT BEDTIME. MAY SUBSTITUTE   acetaminophen  500 MG tablet Commonly known as: TYLENOL  Take 1,000 mg by mouth every 6 (six) hours as needed for mild pain (pain score 1-3).   BD Pen Needle Short Ultrafine 31G X 8 MM Misc Generic drug: Insulin  Pen Needle USE TO ADMINISTER INSULIN  DAILY.   Blood Glucose Monitoring Suppl Devi 1 each by Does not apply route in the morning, at noon, and at bedtime. May substitute to any manufacturer covered by patient's insurance.   Blood Glucose Monitoring Suppl Devi 1 each by Does not apply route in the morning, at noon, and at bedtime. May substitute to any manufacturer covered by patient's insurance.   calcium  carbonate 500 MG chewable tablet Commonly known as: TUMS - dosed in mg elemental calcium  Chew 2 tablets by mouth daily.   folic acid  1 MG tablet Commonly known as: FOLVITE  Take 1 tablet (1 mg total) by mouth daily.   FreeStyle Libre 3 Plus Sensor Misc Change sensor every 15 days.   Lantus  SoloStar 100 UNIT/ML Solostar Pen Generic drug: insulin  glargine Inject 20 Units  into the skin daily. What changed:  how much to take when to take this reasons to take this additional instructions   lipase/protease/amylase 63999 UNITS Cpep capsule Commonly known as: Creon  Take 2 capsules (72,000 Units total) by mouth 3 (three) times daily with meals. What changed:  how much to take when to take  this additional instructions   lisinopril  40 MG tablet Commonly known as: ZESTRIL  Take 1 tablet (40 mg total) by mouth daily.   multivitamin with minerals Tabs tablet Take 1 tablet by mouth daily.   ondansetron  8 MG disintegrating tablet Commonly known as: ZOFRAN -ODT Take 1 tablet (8 mg total) by mouth every 8 (eight) hours as needed for vomiting or nausea.   oxyCODONE  5 MG immediate release tablet Commonly known as: Roxicodone  Take 1 tablet (5 mg total) by mouth every 6 (six) hours as needed for severe pain (pain score 7-10), moderate pain (pain score 4-6) or breakthrough pain. What changed: reasons to take this   pantoprazole  40 MG tablet Commonly known as: Protonix  Take 1 tablet (40 mg total) by mouth daily.   pioglitazone  30 MG tablet Commonly known as: Actos  Take 1 tablet (30 mg total) by mouth daily.   thiamine  100 MG tablet Commonly known as: Vitamin B-1 Take 1 tablet (100 mg total) by mouth daily.        Follow-up Information     Duanne Butler DASEN, MD. Call .   Specialty: Family Medicine Why: Follow up from ER visit Contact information: [address] [place] [address] [phone]         [facility]. Call.   Why: Call or walk in between 5:30am - 11:00Am Contact information: [phone] [address] [place], [place] [address]               No Known Allergies  Consultations: None   Procedures/Studies: US  Abdomen Limited Result Date: 04/29/2024 CLINICAL DATA:  Acute on chronic pancreatitis. EXAM: ULTRASOUND ABDOMEN LIMITED RIGHT UPPER QUADRANT COMPARISON:  CT abdomen/pelvis 04/28/2024, right upper quadrant ultrasound 03/26/2024 FINDINGS: Gallbladder: Minimal gallbladder sludge. No gallstones or significant gallbladder wall thickening. Negative sonographic Murphy sign. Common bile duct: Diameter: 3.8 mm. Liver: Focal fatty infiltration over the central liver. No focal mass. Portal vein is patent on  color Doppler imaging with normal direction of blood flow towards the liver. Other: Visualized portions of the pancreas demonstrate slight decreased echogenicity of the pancreas which may be due to edema in the setting of acute pancreatitis as suspected in this patient per recent CT. IMPRESSION: 1. Minimal gallbladder sludge. No evidence of cholelithiasis or acute cholecystitis. 2. Focal fatty infiltration over the central liver. 3. Slight decreased echogenicity of the pancreas which may be due to edema in the setting of acute pancreatitis as suspected in this patient per recent CT. Electronically Signed   By: Toribio Agreste M.D.   On: 04/29/2024 10:28   CT ABDOMEN PELVIS W CONTRAST Result Date: 04/28/2024 CLINICAL DATA:  Epigastric pain. EXAM: CT ABDOMEN AND PELVIS WITH CONTRAST TECHNIQUE: Multidetector CT imaging of the abdomen and pelvis was performed using the standard protocol following bolus administration of intravenous contrast. RADIATION DOSE REDUCTION: This exam was performed according to the departmental dose-optimization program which includes automated exposure control, adjustment of the mA and/or kV according to patient size and/or use of iterative reconstruction technique. CONTRAST:  OMNIPAQUE  IOHEXOL  300 MG/ML  SOLN COMPARISON:  CT abdomen and pelvis 03/25/2024. FINDINGS: Lower chest: No acute abnormality. Hepatobiliary:  There is diffuse fatty infiltration of the liver. Gallbladder and bile ducts are within normal limits. Pancreas: There is mild diffuse peripancreatic fat stranding. Pancreatic tail calcifications are again seen. There are 2 cystic structures in the tail of the pancreas measuring 2.3 cm and 1.8 cm similar to the prior examination. No new fluid collections are identified. Spleen: Exophytic subcapsular fluid collection measuring 9.4 x 6.4 cm appears unchanged. Underlying spleen is within normal limits. Adrenals/Urinary Tract: Adrenal glands are unremarkable. Kidneys are normal,  without renal calculi, focal lesion, or hydronephrosis. Bladder is unremarkable. Stomach/Bowel: Stomach is within normal limits. Appendix appears normal. No evidence of bowel wall thickening, distention, or inflammatory changes. There is sigmoid colon diverticulosis. Vascular/Lymphatic: No significant vascular findings are present. No enlarged abdominal or pelvic lymph nodes. Reproductive: Uterus and bilateral adnexa are unremarkable. Other: No abdominal wall hernia or abnormality. No abdominopelvic ascites. Musculoskeletal: No acute or significant osseous findings. IMPRESSION: 1. Findings compatible with acute on chronic pancreatitis. 2. Stable cystic structures in the tail of the pancreas, likely pseudocysts. 3. Stable subcapsular splenic fluid collection. 4. Fatty infiltration of the liver. 5. Sigmoid colon diverticulosis. Electronically Signed   By: Greig Pique M.D.   On: 04/28/2024 18:57     Discharge Exam: Vitals:   05/02/24 1931 05/03/24 0332  BP: 108/84 111/76  Pulse: 76 66  Resp: 13 14  Temp: 97.8 F (36.6 C) 98.1 F (36.7 C)  SpO2: 97% 96%   Vitals:   05/02/24 1522 05/02/24 1823 05/02/24 1931 05/03/24 0332  BP: 121/86 129/76 108/84 111/76  Pulse: 88 75 76 66  Resp: 18  13 14   Temp:   97.8 F (36.6 C) 98.1 F (36.7 C)  TempSrc:   Oral Oral  SpO2: 96%  97% 96%  Weight:      Height:        General: Pt is alert, awake, not in acute distress Cardiovascular: RRR, S1/S2 +, no rubs, no gallops Respiratory: CTA bilaterally, no wheezing, no rhonchi Abdominal: Soft, NT, ND, bowel sounds + Extremities: no edema, no cyanosis    The results of significant diagnostics from this hospitalization (including imaging, microbiology, ancillary and laboratory) are listed below for reference.     Microbiology: No results found for this or any previous visit (from the past 240 hours).   Labs: BNP (last 3 results) No results for input(s): BNP in the last 8760 hours. Basic Metabolic  Panel: Recent Labs  Lab 04/29/24 0444 04/30/24 0419 05/01/24 0434 05/02/24 0428 05/03/24 0414  NA 133* 131* 132* 135 140  K 3.0* 3.8 2.9* 3.2* 2.9*  CL 100 97* 101 103 103  CO2 22 23 22 24 27   GLUCOSE 174* 172* 119* 92 115*  BUN 11 5* 6 6 5*  CREATININE 0.39* 0.47 0.34* 0.31* 0.33*  CALCIUM  8.2* 8.9 8.6* 8.3* 8.6*  MG 1.7 1.8 1.7 1.6* 1.7  PHOS 2.4*  --   --   --   --    Liver Function Tests: Recent Labs  Lab 04/29/24 0444 04/30/24 0419 05/01/24 0434 05/02/24 0428 05/03/24 0414  AST 16 18 12* 25 20  ALT 12 10 10 12 13   ALKPHOS 103 106 87 84 83  BILITOT 1.1 1.5* 1.6* 1.6* 0.8  PROT 6.8 7.8 6.9 6.4* 6.2*  ALBUMIN 3.4* 3.6 3.1* 2.8* 2.7*   Recent Labs  Lab 04/28/24 1655  LIPASE 115*   No results for input(s): AMMONIA  in the last 168 hours. CBC: Recent Labs  Lab 04/28/24 1655 04/29/24  9555 04/30/24 0419 05/01/24 0434 05/02/24 0428 05/03/24 0414  WBC 9.7 10.2 14.6* 11.5* 7.3 7.0  NEUTROABS 7.2  --   --   --   --   --   HGB 17.2* 15.7* 16.9* 15.6* 15.2* 14.2  HCT 49.2* 46.3* 50.7* 45.9 45.6 42.6  MCV 92.3 93.7 94.8 93.1 95.6 94.7  PLT 208 183 200 199 180 207   Cardiac Enzymes: No results for input(s): CKTOTAL, CKMB, CKMBINDEX, TROPONINI in the last 168 hours. BNP: Invalid input(s): POCBNP CBG: Recent Labs  Lab 05/02/24 0729 05/02/24 1125 05/02/24 1621 05/02/24 2123 05/03/24 0730  GLUCAP 108* 184* 250* 89 139*   D-Dimer No results for input(s): DDIMER in the last 72 hours. Hgb A1c No results for input(s): HGBA1C in the last 72 hours. Lipid Profile No results for input(s): CHOL, HDL, LDLCALC, TRIG, CHOLHDL, LDLDIRECT in the last 72 hours. Thyroid  function studies No results for input(s): TSH, T4TOTAL, T3FREE, THYROIDAB in the last 72 hours.  Invalid input(s): FREET3 Anemia work up No results for input(s): VITAMINB12, FOLATE, FERRITIN, TIBC, IRON, RETICCTPCT in the last 72 hours. Urinalysis     Component Value Date/Time   COLORURINE YELLOW 04/28/2024 2044   APPEARANCEUR HAZY (A) 04/28/2024 2044   LABSPEC 1.010 04/28/2024 2044   PHURINE 5.0 04/28/2024 2044   GLUCOSEU >=500 (A) 04/28/2024 2044   HGBUR NEGATIVE 04/28/2024 2044   BILIRUBINUR NEGATIVE 04/28/2024 2044   KETONESUR 80 (A) 04/28/2024 2044   PROTEINUR NEGATIVE 04/28/2024 2044   UROBILINOGEN 0.2 09/28/2007 0450   NITRITE POSITIVE (A) 04/28/2024 2044   LEUKOCYTESUR NEGATIVE 04/28/2024 2044   Sepsis Labs Recent Labs  Lab 04/30/24 0419 05/01/24 0434 05/02/24 0428 05/03/24 0414  WBC 14.6* 11.5* 7.3 7.0   Microbiology No results found for this or any previous visit (from the past 240 hours).   Time coordinating discharge: 35 minutes  SIGNED:   Adron JONETTA Fairly, DO [facility] 05/03/2024, 9:42 AM  If 7PM-7AM, please contact night-coverage [email or web address]

## 2024-05-04 ENCOUNTER — Telehealth: Payer: Self-pay | Admitting: *Deleted

## 2024-05-04 NOTE — Transitions of Care (Post Inpatient/ED Visit) (Signed)
   05/04/2024  Name: Jillian Shepherd MRN: [id number] DOB: [birth year]  Today's TOC FU Call Status: Today's TOC FU Call Status:: Unsuccessful Call (1st Attempt) Unsuccessful Call (1st Attempt) Date: 05/04/24  Attempted to reach the patient regarding the most recent Inpatient/ED visit.  Follow Up Plan: Additional outreach attempts will be made to reach the patient to complete the Transitions of Care (Post Inpatient/ED visit) call.   Mliss Creed [facility], BSN RN Care Manager/ Transition of Care [place]/ [facility] [phone]

## 2024-05-05 ENCOUNTER — Telehealth: Payer: Self-pay | Admitting: *Deleted

## 2024-05-05 NOTE — Transitions of Care (Post Inpatient/ED Visit) (Signed)
   05/05/2024  Name: Jillian Shepherd MRN: [id number] DOB: 08-11-1985  Today's TOC FU Call Status: Today's TOC FU Call Status:: Unsuccessful Call (2nd Attempt) Unsuccessful Call (2nd Attempt) Date: 05/05/24  Attempted to reach the patient regarding the most recent Inpatient/ED visit.  Follow Up Plan: Additional outreach attempts will be made to reach the patient to complete the Transitions of Care (Post Inpatient/ED visit) call.   Mliss Creed [facility], BSN RN Care Manager/ Transition of Care [place]/ [facility] [phone]

## 2024-05-06 ENCOUNTER — Telehealth: Payer: Self-pay | Admitting: *Deleted

## 2024-05-06 NOTE — Transitions of Care (Post Inpatient/ED Visit) (Signed)
   05/06/2024  Name: Jillian Shepherd MRN: [id number] DOB: Jul 01, 1986  Today's TOC FU Call Status: Today's TOC FU Call Status:: Unsuccessful Call (3rd Attempt) Unsuccessful Call (3rd Attempt) Date: 05/06/24  Attempted to reach the patient regarding the most recent Inpatient/ED visit.  Follow Up Plan: No further outreach attempts will be made at this time. We have been unable to contact the patient.  Mliss Creed [facility], BSN RN Care Manager/ Transition of Care [place]/ [facility] [phone]

## 2024-07-13 ENCOUNTER — Other Ambulatory Visit (HOSPITAL_COMMUNITY): Payer: Self-pay

## 2024-08-04 ENCOUNTER — Ambulatory Visit: Admitting: Family Medicine

## 2024-08-04 ENCOUNTER — Encounter: Payer: Self-pay | Admitting: Family Medicine

## 2024-08-04 VITALS — BP 133/98 | HR 98 | Ht 63.0 in | Wt 152.2 lb

## 2024-08-04 DIAGNOSIS — Z3201 Encounter for pregnancy test, result positive: Secondary | ICD-10-CM | POA: Diagnosis not present

## 2024-08-04 DIAGNOSIS — Z794 Long term (current) use of insulin: Secondary | ICD-10-CM

## 2024-08-04 DIAGNOSIS — F17210 Nicotine dependence, cigarettes, uncomplicated: Secondary | ICD-10-CM | POA: Diagnosis not present

## 2024-08-04 DIAGNOSIS — I10 Essential (primary) hypertension: Secondary | ICD-10-CM | POA: Diagnosis not present

## 2024-08-04 DIAGNOSIS — E1165 Type 2 diabetes mellitus with hyperglycemia: Secondary | ICD-10-CM | POA: Diagnosis not present

## 2024-08-04 LAB — PREGNANCY, URINE: Preg Test, Ur: POSITIVE — AB

## 2024-08-04 MED ORDER — LABETALOL HCL 100 MG PO TABS: 100.0000 mg | ORAL_TABLET | Freq: Two times a day (BID) | ORAL | 1 refills | Status: DC

## 2024-08-04 NOTE — Progress Notes (Signed)
 "  Acute Office Visit  Patient ID: Jillian Shepherd, female    DOB: [birth year], 38 y.o.   MRN: [id number]  PCP: Duanne Butler DASEN, MD  Chief Complaint  Patient presents with   Acute Visit    Positive pregnancy test  Needs a confirmed pregnancy test      Subjective:     HPI  Discussed the use of AI scribe software for clinical note transcription with the patient, who gave verbal consent to proceed.  History of Present Illness Jillian Shepherd is a 38 year old female with hypertension and diabetes who presents with a positive home pregnancy test.  She has taken two home urine pregnancy tests, both positive, with the first test approximately two weeks prior to the second, which was last week. Her last menstrual period began around September 26th or 27th, and she missed her cycles in October and November. Initially attributing the missed October period to stress, she became concerned after missing November's cycle.  She has hypertension and diabetes, managed with insulin , taking 20 units of a long-acting formulation. She also takes Creon  but questions its necessity as she feels her pancreas is functioning better. She reports making dietary changes since a recent hospital visit.  She smokes cigarettes but has reduced her intake from a pack a day due to nausea associated with smoking. She denies alcohol or drug use. She experiences nausea triggered by certain smells and smoking, and reports breast tenderness requiring her to wear a sports bra. No abdominal pain, cramping, or bleeding, but notes a sensation of bloating in her abdomen.  She has a history of a previous pregnancy last year, which was terminated. She is not currently using any form of birth control.   Review of Systems  All other systems reviewed and are negative.   Past Medical History:  Diagnosis Date   Abdominal pain    Alcohol abuse    B12 deficiency    Cancer (HCC)    squmaous cell skin cancer    Collagen vascular  disease    Diabetes mellitus without complication (HCC)    Hypertension    Hypokalemia    Liddle's syndrome    Obesity (BMI 35.0-39.9 without comorbidity)    Pancreatitis    Pleural effusion    Splenic hemorrhage     Past Surgical History:  Procedure Laterality Date   ADENOIDECTOMY     CHEST TUBE INSERTION     COLONOSCOPY WITH PROPOFOL  N/A 10/06/2020   Procedure: COLONOSCOPY WITH PROPOFOL ;  Surgeon: Unk Corinn Skiff, MD;  Location: [facility] ENDOSCOPY;  Service: Gastroenterology;  Laterality: N/A;  COVID POSITIVE 08/23/2020   ESOPHAGOGASTRODUODENOSCOPY (EGD) WITH PROPOFOL  N/A 10/06/2020   Procedure: ESOPHAGOGASTRODUODENOSCOPY (EGD) WITH PROPOFOL ;  Surgeon: Unk Corinn Skiff, MD;  Location: [facility] ENDOSCOPY;  Service: Gastroenterology;  Laterality: N/A;   skin cancer removal Left    leg   TONSILLECTOMY      Outpatient Medications Prior to Visit  Medication Sig Dispense Refill   ACCU-CHEK GUIDE TEST test strip USE IN THE MORNING, AT NOON, AND AT BEDTIME. MAY SUBSTITUTE TO ANY MANUFACTURER 100 strip 0   Accu-Chek Softclix Lancets lancets USE IN THE MORNING, AT NOON, AND AT BEDTIME. MAY SUBSTITUTE 100 each 3   BD PEN NEEDLE SHORT ULTRAFINE 31G X 8 MM MISC USE TO ADMINISTER INSULIN  DAILY. 100 each 1   Blood Glucose Monitoring Suppl DEVI 1 each by Does not apply route in the morning, at noon, and at bedtime. May substitute to any manufacturer covered by  patient's insurance. 1 each 0   Blood Glucose Monitoring Suppl DEVI 1 each by Does not apply route in the morning, at noon, and at bedtime. May substitute to any manufacturer covered by patient's insurance. 1 each 0   calcium  carbonate (TUMS - DOSED IN MG ELEMENTAL CALCIUM ) 500 MG chewable tablet Chew 2 tablets by mouth daily.     insulin  glargine (LANTUS  SOLOSTAR) 100 UNIT/ML Solostar Pen Inject 20 Units into the skin daily. (Patient taking differently: Inject 10-20 Units into the skin 3 (three) times daily as needed (high blood sugar). Use at  least once daily) 15 mL 5   lipase/protease/amylase (CREON ) 36000 UNITS CPEP capsule Take 2 capsules (72,000 Units total) by mouth 3 (three) times daily with meals. (Patient taking differently: Take 36,000 Units by mouth See admin instructions. Take 2-3 capsules with meals 3 times a day and with snacks) 300 capsule 2   Multiple Vitamin (MULTIVITAMIN WITH MINERALS) TABS tablet Take 1 tablet by mouth daily.     acetaminophen  (TYLENOL ) 500 MG tablet Take 1,000 mg by mouth every 6 (six) hours as needed for mild pain (pain score 1-3). (Patient not taking: Reported on 08/04/2024)     Continuous Glucose Sensor (FREESTYLE LIBRE 3 PLUS SENSOR) MISC Change sensor every 15 days. (Patient not taking: Reported on 08/04/2024) 2 each 5   folic acid  (FOLVITE ) 1 MG tablet Take 1 tablet (1 mg total) by mouth daily. (Patient not taking: Reported on 08/04/2024) 30 tablet 1   lisinopril  (ZESTRIL ) 40 MG tablet Take 1 tablet (40 mg total) by mouth daily. (Patient not taking: Reported on 08/04/2024) 30 tablet 1   ondansetron  (ZOFRAN -ODT) 8 MG disintegrating tablet Take 1 tablet (8 mg total) by mouth every 8 (eight) hours as needed for vomiting or nausea. (Patient not taking: Reported on 08/04/2024) 20 tablet 0   oxyCODONE  (ROXICODONE ) 5 MG immediate release tablet Take 1 tablet (5 mg total) by mouth every 6 (six) hours as needed for severe pain (pain score 7-10), moderate pain (pain score 4-6) or breakthrough pain. (Patient not taking: Reported on 08/04/2024) 20 tablet 0   pantoprazole  (PROTONIX ) 40 MG tablet Take 1 tablet (40 mg total) by mouth daily. (Patient not taking: Reported on 08/04/2024) 30 tablet 1   pioglitazone  (ACTOS ) 30 MG tablet Take 1 tablet (30 mg total) by mouth daily. (Patient not taking: Reported on 08/04/2024) 30 tablet 3   thiamine  (VITAMIN B-1) 100 MG tablet Take 1 tablet (100 mg total) by mouth daily. (Patient not taking: Reported on 08/04/2024) 30 tablet 1   No facility-administered medications prior  to visit.    Allergies[1]     Objective:    BP (!) 133/98   Pulse 98   Ht 5' 3 (1.6 m)   Wt 152 lb 3.2 oz (69 kg)   LMP 04/28/2024 (Approximate)   SpO2 98%   BMI 26.96 kg/m    Physical Exam Vitals and nursing note reviewed.  Constitutional:      Appearance: Normal appearance. She is normal weight.  HENT:     Head: Normocephalic and atraumatic.  Abdominal:     Palpations: Abdomen is soft.     Tenderness: There is no abdominal tenderness.  Skin:    General: Skin is warm and dry.  Neurological:     General: No focal deficit present.     Mental Status: She is alert and oriented to person, place, and time. Mental status is at baseline.  Psychiatric:  Mood and Affect: Mood normal.        Behavior: Behavior normal.        Thought Content: Thought content normal.        Judgment: Judgment normal.       Results for orders placed or performed in visit on 08/04/24  Pregnancy, urine  Result Value Ref Range   Preg Test, Ur POSITIVE (A) NEGATIVE       Assessment & Plan:   Problem List Items Addressed This Visit       Cardiovascular and Mediastinum   Hypertension   Relevant Medications   labetalol  (NORMODYNE ) 100 MG tablet     Endocrine   Type 2 diabetes mellitus with hyperglycemia (HCC)     Other   Positive urine pregnancy test - Primary   Relevant Orders   Pregnancy, urine (Completed)   Cigarette nicotine  dependence without complication    Assessment and Plan Assessment & Plan Confirmed intrauterine pregnancy, first trimester, high risk due to pre-existing type 2 diabetes mellitus and hypertension High-risk pregnancy due to diabetes and hypertension.  - Established with OB GYN for high-risk pregnancy management.  Type 2 diabetes mellitus Managed with long-acting insulin . Improvement in symptoms noted. Insulin  regimen discussed for potential adjustment. - Continue current insulin  regimen. - Discuss insulin  regimen with PCP for potential  adjustment.  Hypertension Stopped taking Lisinopril  due to dizziness several months ago. Blood pressure uncontrolled today. - Start Labetalol  100 mg BID and follow up in 1 month.  Nicotine  dependence, cigarettes Nicotine  dependence with recent reduction in smoking. Advised to quit due to pregnancy and health risks. - Advised smoking cessation. - Discussed risks of smoking during pregnancy. - 3-5 minute discussion regarding the harms of tobacco use, the benefits of cessation, and methods of cessation.     Meds ordered this encounter  Medications   labetalol  (NORMODYNE ) 100 MG tablet    Sig: Take 1 tablet (100 mg total) by mouth 2 (two) times daily.    Dispense:  60 tablet    Refill:  1    Supervising Provider:   DUANNE LOWERS T [[id number]]    No follow-ups on file.  Jeoffrey GORMAN Barrio, FNP [facility]      [1] No Known Allergies  "

## 2024-08-11 ENCOUNTER — Other Ambulatory Visit

## 2024-08-11 DIAGNOSIS — O10919 Unspecified pre-existing hypertension complicating pregnancy, unspecified trimester: Secondary | ICD-10-CM

## 2024-08-11 DIAGNOSIS — O09529 Supervision of elderly multigravida, unspecified trimester: Secondary | ICD-10-CM

## 2024-08-11 DIAGNOSIS — O26851 Spotting complicating pregnancy, first trimester: Secondary | ICD-10-CM

## 2024-08-11 DIAGNOSIS — O24119 Pre-existing diabetes mellitus, type 2, in pregnancy, unspecified trimester: Secondary | ICD-10-CM

## 2024-08-11 DIAGNOSIS — O3680X0 Pregnancy with inconclusive fetal viability, not applicable or unspecified: Secondary | ICD-10-CM

## 2024-08-11 DIAGNOSIS — Z3201 Encounter for pregnancy test, result positive: Secondary | ICD-10-CM

## 2024-08-11 NOTE — Progress Notes (Signed)
 US  TA/TV: 6+1 wks,single IUP with yolk sac,no FHT visualized,CRL 4.66 mm,normal ovaries,Jennifer Griffin discussed results with patient,f/u US  in 10-14 days with provider appt. Scheduled

## 2024-08-13 ENCOUNTER — Other Ambulatory Visit

## 2024-08-19 ENCOUNTER — Ambulatory Visit: Payer: Self-pay | Admitting: Adult Health

## 2024-08-19 ENCOUNTER — Telehealth: Payer: Self-pay | Admitting: *Deleted

## 2024-08-19 LAB — BETA HCG QUANT (REF LAB): hCG Quant: 2146 m[IU]/mL

## 2024-08-19 NOTE — Telephone Encounter (Signed)
 Pt is early pregnant. She started bleeding 2 days ago. Bleeding started after a BM. Pt did feel constipated. Pt is having cramps off and on. Pt states she has passed 2 clots. Pt was advised she can come in tomorrow and have quant repeated to see if numbers are increasing or decreasing. If pain gets worse, can go to hospital for evaluation. Pt wants to have quant repeated. Pt placed on lab schedule. JSY

## 2024-08-20 ENCOUNTER — Emergency Department
Admission: EM | Admit: 2024-08-20 | Discharge: 2024-08-23 | Disposition: A | Discharge status: Other Acute Inpatient Hospital | Attending: Emergency Medicine | Admitting: Emergency Medicine

## 2024-08-20 ENCOUNTER — Other Ambulatory Visit (HOSPITAL_COMMUNITY)
Admission: RE | Admit: 2024-08-20 | Discharge: 2024-08-20 | Disposition: A | Discharge status: Home / Self Care | Source: Ambulatory Visit | Attending: Adult Health | Admitting: Adult Health

## 2024-08-20 ENCOUNTER — Ambulatory Visit

## 2024-08-20 ENCOUNTER — Other Ambulatory Visit

## 2024-08-20 DIAGNOSIS — F1014 Alcohol abuse with alcohol-induced mood disorder: Secondary | ICD-10-CM | POA: Insufficient documentation

## 2024-08-20 DIAGNOSIS — F4321 Adjustment disorder with depressed mood: Secondary | ICD-10-CM | POA: Insufficient documentation

## 2024-08-20 DIAGNOSIS — O209 Hemorrhage in early pregnancy, unspecified: Secondary | ICD-10-CM | POA: Diagnosis present

## 2024-08-20 DIAGNOSIS — Z046 Encounter for general psychiatric examination, requested by authority: Secondary | ICD-10-CM

## 2024-08-20 DIAGNOSIS — Y908 Blood alcohol level of 240 mg/100 ml or more: Secondary | ICD-10-CM | POA: Diagnosis not present

## 2024-08-20 DIAGNOSIS — Z85828 Personal history of other malignant neoplasm of skin: Secondary | ICD-10-CM | POA: Diagnosis not present

## 2024-08-20 DIAGNOSIS — F4323 Adjustment disorder with mixed anxiety and depressed mood: Secondary | ICD-10-CM | POA: Diagnosis not present

## 2024-08-20 DIAGNOSIS — F1090 Alcohol use, unspecified, uncomplicated: Secondary | ICD-10-CM | POA: Diagnosis not present

## 2024-08-20 DIAGNOSIS — Z8659 Personal history of other mental and behavioral disorders: Secondary | ICD-10-CM | POA: Insufficient documentation

## 2024-08-20 DIAGNOSIS — F1010 Alcohol abuse, uncomplicated: Secondary | ICD-10-CM | POA: Diagnosis present

## 2024-08-20 DIAGNOSIS — I10 Essential (primary) hypertension: Secondary | ICD-10-CM | POA: Insufficient documentation

## 2024-08-20 DIAGNOSIS — F1994 Other psychoactive substance use, unspecified with psychoactive substance-induced mood disorder: Secondary | ICD-10-CM | POA: Diagnosis not present

## 2024-08-20 DIAGNOSIS — Z3A00 Weeks of gestation of pregnancy not specified: Secondary | ICD-10-CM

## 2024-08-20 DIAGNOSIS — F1020 Alcohol dependence, uncomplicated: Secondary | ICD-10-CM | POA: Diagnosis not present

## 2024-08-20 LAB — CBC WITH DIFFERENTIAL/PLATELET
Abs Immature Granulocytes: 0.03 K/uL (ref 0.00–0.07)
Basophils Absolute: 0 K/uL (ref 0.0–0.1)
Basophils Relative: 0 %
Eosinophils Absolute: 0.2 K/uL (ref 0.0–0.5)
Eosinophils Relative: 2 %
HCT: 45.7 % (ref 36.0–46.0)
Hemoglobin: 15.8 g/dL — ABNORMAL HIGH (ref 12.0–15.0)
Immature Granulocytes: 0 %
Lymphocytes Relative: 33 %
Lymphs Abs: 2.7 K/uL (ref 0.7–4.0)
MCH: 33.1 pg (ref 26.0–34.0)
MCHC: 34.6 g/dL (ref 30.0–36.0)
MCV: 95.8 fL (ref 80.0–100.0)
Monocytes Absolute: 0.3 K/uL (ref 0.1–1.0)
Monocytes Relative: 4 %
Neutro Abs: 4.9 K/uL (ref 1.7–7.7)
Neutrophils Relative %: 61 %
Platelets: 277 K/uL (ref 150–400)
RBC: 4.77 MIL/uL (ref 3.87–5.11)
RDW: 12.7 % (ref 11.5–15.5)
WBC: 8.2 K/uL (ref 4.0–10.5)
nRBC: 0 % (ref 0.0–0.2)

## 2024-08-20 NOTE — ED Provider Notes (Signed)
 "  [facility] Provider Note    Event Date/Time   First MD Initiated Contact with Patient 08/20/24 2258     (approximate)   History   Miscarriage and Suicidal   HPI  Jillian Shepherd is a 39 y.o. female  who presents to the emergency department today because of concern for possible suicidal ideation. Apparently the patient's husband called 911 because she was threatening to harm herself. However the patient states that her husband just wanted her out of the house. She denies any thoughts of self harm. Denies history of self harm. Does state that she was told today that she is having a miscarriage.      Physical Exam   Triage Vital Signs: ED Triage Vitals  Encounter Vitals Group     BP 08/20/24 2259 110/71     Girls Systolic BP Percentile --      Girls Diastolic BP Percentile --      Boys Systolic BP Percentile --      Boys Diastolic BP Percentile --      Pulse Rate 08/20/24 2259 98     Resp 08/20/24 2259 16     Temp 08/20/24 2259 98.3 F (36.8 C)     Temp Source 08/20/24 2259 Oral     SpO2 08/20/24 2259 98 %     Weight 08/20/24 2244 150 lb (68 kg)     Height 08/20/24 2244 5' 4 (1.626 m)     Head Circumference --      Peak Flow --      Pain Score 08/20/24 2242 10     Pain Loc --      Pain Education --      Exclude from Growth Chart --     Most recent vital signs: Vitals:   08/20/24 2259  BP: 110/71  Pulse: 98  Resp: 16  Temp: 98.3 F (36.8 C)  SpO2: 98%   General: Awake, alert, oriented. CV:  Good peripheral perfusion. Regular rate and rhythm. Resp:  Normal effort. Lungs clear. Abd:  No distention.    ED Results / Procedures / Treatments   Labs (all labs ordered are listed, but only abnormal results are displayed) Labs Reviewed  CBC WITH DIFFERENTIAL/PLATELET - Abnormal; Notable for the following components:      Result Value   Hemoglobin 15.8 (*)    All other components within normal limits  COMPREHENSIVE METABOLIC PANEL  WITH GFR - Abnormal; Notable for the following components:   CO2 19 (*)    Glucose, Bld 371 (*)    BUN <5 (*)    Alkaline Phosphatase 130 (*)    Anion gap 19 (*)    All other components within normal limits  ETHANOL - Abnormal; Notable for the following components:   Alcohol, Ethyl (B) 263 (*)    All other components within normal limits  SALICYLATE LEVEL - Abnormal; Notable for the following components:   Salicylate Lvl <7.0 (*)    All other components within normal limits  ACETAMINOPHEN  LEVEL - Abnormal; Notable for the following components:   Acetaminophen  (Tylenol ), Serum <10 (*)    All other components within normal limits  URINALYSIS, ROUTINE W REFLEX MICROSCOPIC - Abnormal; Notable for the following components:   Color, Urine YELLOW (*)    APPearance HAZY (*)    Specific Gravity, Urine 1.031 (*)    Glucose, UA >=500 (*)    Hgb urine dipstick LARGE (*)    Ketones, ur 5 (*)  Bacteria, UA MANY (*)    All other components within normal limits  URINE DRUG SCREEN     EKG  None   RADIOLOGY None   PROCEDURES:  Critical Care performed: No   MEDICATIONS ORDERED IN ED: Medications - No data to display   IMPRESSION / MDM / ASSESSMENT AND PLAN / ED COURSE  I reviewed the triage vital signs and the nursing notes.                              Differential diagnosis includes, but is not limited to, SI, drug induced mood disorder  Patient's presentation is most consistent with acute presentation with potential threat to life or bodily function.   Patient presented to the emergency department today under IVC because of concerns for voicing SI.  Patient herself states that her husband called police without cause.  Per IVC paperwork however the patient was seen destroying property.  Given unclear sequence of events will have psychiatry evaluate.  Will continue IVC.  The patient has been placed in psychiatric observation due to the need to provide a safe environment for  the patient while obtaining psychiatric consultation and evaluation, as well as ongoing medical and medication management to treat the patient's condition.  The patient has been placed under full IVC at this time.       FINAL CLINICAL IMPRESSION(S) / ED DIAGNOSES   Final diagnoses:  Alcohol abuse  Involuntary commitment        Rx / DC Orders   ED Discharge Orders     None        Note:  This document was prepared using Dragon voice recognition software and may include unintentional dictation errors.    Floy Roberts, MD 08/21/24 [phone]  "

## 2024-08-20 NOTE — Progress Notes (Signed)
 Patient came in to office today experiencing bleeding in early pregnancy. Had ultrasound on 08/11/24 and no FHT indicated. Patient had HCG level drawn on 08/18/24 and level was 2,146. Bleeding started late yesterday. RN spoke with Delon Lewis NP about plan. Hcg level put in and patient instructed to have lab drawn today. Already has follow up ultrasound on 08/25/24 and provider visit after. Explained what levels should be doing and that miscarriage could be possible. Patient verbalized understanding and is amenable to the plan. All questions answered.

## 2024-08-20 NOTE — ED Notes (Signed)
 Pt belongings:  Black shoes Black socks Grey sweat pants Navy sweat shirt Pink sports bra Earrings A ring Driver's license

## 2024-08-20 NOTE — ED Provider Notes (Incomplete)
 "  [facility] Provider Note    Event Date/Time   First MD Initiated Contact with Patient 08/20/24 2258     (approximate)   History   Miscarriage and Suicidal   HPI  Jillian Shepherd is a 39 y.o. female  who presents to the emergency department today because of concern for possible suicidal ideation. Apparently the patient's husband called 911 because she was threatening to harm herself. However the patient states that her husband just wanted her out of the house. She denies any thoughts of self harm. Denies history of self harm. Does state that she was told today that she is having a miscarriage.      Physical Exam   Triage Vital Signs: ED Triage Vitals  Encounter Vitals Group     BP 08/20/24 2259 110/71     Girls Systolic BP Percentile --      Girls Diastolic BP Percentile --      Boys Systolic BP Percentile --      Boys Diastolic BP Percentile --      Pulse Rate 08/20/24 2259 98     Resp 08/20/24 2259 16     Temp 08/20/24 2259 98.3 F (36.8 C)     Temp Source 08/20/24 2259 Oral     SpO2 08/20/24 2259 98 %     Weight 08/20/24 2244 150 lb (68 kg)     Height 08/20/24 2244 5' 4 (1.626 m)     Head Circumference --      Peak Flow --      Pain Score 08/20/24 2242 10     Pain Loc --      Pain Education --      Exclude from Growth Chart --     Most recent vital signs: Vitals:   08/20/24 2259  BP: 110/71  Pulse: 98  Resp: 16  Temp: 98.3 F (36.8 C)  SpO2: 98%    {Only need to document appropriate and relevant physical exam:1} General: Awake, no distress. *** CV:  Good peripheral perfusion. *** Resp:  Normal effort. *** Abd:  No distention. *** Other:  ***   ED Results / Procedures / Treatments   Labs (all labs ordered are listed, but only abnormal results are displayed) Labs Reviewed - No data to display   EKG  ***   RADIOLOGY *** {USE THE WORD INTERPRETED!! You MUST document your own interpretation of imaging, as well as  the fact that you reviewed the radiologist's report!:1}   PROCEDURES:  Critical Care performed: Yes  CRITICAL CARE Performed by: Guadalupe Eagles   Total critical care time: *** minutes  Critical care time was exclusive of separately billable procedures and treating other patients.  Critical care was necessary to treat or prevent imminent or life-threatening deterioration.  Critical care was time spent personally by me on the following activities: development of treatment plan with patient and/or surrogate as well as nursing, discussions with consultants, evaluation of patient's response to treatment, examination of patient, obtaining history from patient or surrogate, ordering and performing treatments and interventions, ordering and review of laboratory studies, ordering and review of radiographic studies, pulse oximetry and re-evaluation of patient's condition.   Procedures    MEDICATIONS ORDERED IN ED: Medications - No data to display   IMPRESSION / MDM / ASSESSMENT AND PLAN / ED COURSE  I reviewed the triage vital signs and the nursing notes.  Differential diagnosis includes, but is not limited to, ***  Patient's presentation is most consistent with {EM COPA:27473}   ***The patient is on the cardiac monitor to evaluate for evidence of arrhythmia and/or significant heart rate changes.  ***      FINAL CLINICAL IMPRESSION(S) / ED DIAGNOSES   Final diagnoses:  None        Rx / DC Orders   ED Discharge Orders     None        Note:  This document was prepared using Dragon voice recognition software and may include unintentional dictation errors.  "

## 2024-08-20 NOTE — ED Triage Notes (Signed)
 Pt to ED via EMS from home with LEO c/o vaginal bleeding and abdominal pain. Pt was told today that she was having a miscarriage at [redacted] weeks pregnant.  Police was called out to the home this evening by husband due to patient threatening to harm herself. ETOH and oxycodone  use reported by husband. PT denying thoughts of harming self at this time. AxO4.   90 HR 125/80 95% RA

## 2024-08-21 ENCOUNTER — Ambulatory Visit: Payer: Self-pay | Admitting: Adult Health

## 2024-08-21 DIAGNOSIS — F1020 Alcohol dependence, uncomplicated: Secondary | ICD-10-CM | POA: Diagnosis not present

## 2024-08-21 DIAGNOSIS — F4321 Adjustment disorder with depressed mood: Secondary | ICD-10-CM

## 2024-08-21 DIAGNOSIS — F1994 Other psychoactive substance use, unspecified with psychoactive substance-induced mood disorder: Secondary | ICD-10-CM | POA: Diagnosis not present

## 2024-08-21 LAB — URINE DRUG SCREEN
Amphetamines: NEGATIVE
Barbiturates: NEGATIVE
Benzodiazepines: NEGATIVE
Cocaine: NEGATIVE
Fentanyl: NEGATIVE
Methadone Scn, Ur: NEGATIVE
Opiates: NEGATIVE
Tetrahydrocannabinol: NEGATIVE

## 2024-08-21 LAB — COMPREHENSIVE METABOLIC PANEL WITH GFR
ALT: 12 U/L (ref 0–44)
AST: 19 U/L (ref 15–41)
Albumin: 4.4 g/dL (ref 3.5–5.0)
Alkaline Phosphatase: 130 U/L — ABNORMAL HIGH (ref 38–126)
Anion gap: 19 — ABNORMAL HIGH (ref 5–15)
BUN: <5 mg/dL — ABNORMAL LOW (ref 6–20)
CO2: 19 mmol/L — ABNORMAL LOW (ref 22–32)
Calcium: 9.3 mg/dL (ref 8.9–10.3)
Chloride: 101 mmol/L (ref 98–111)
Creatinine, Ser: 0.47 mg/dL (ref 0.44–1.00)
GFR, Estimated: >60 mL/min
Glucose, Bld: 371 mg/dL — ABNORMAL HIGH (ref 70–99)
Potassium: 3.6 mmol/L (ref 3.5–5.1)
Sodium: 138 mmol/L (ref 135–145)
Total Bilirubin: 0.3 mg/dL (ref 0.0–1.2)
Total Protein: 7.6 g/dL (ref 6.5–8.1)

## 2024-08-21 LAB — URINALYSIS, ROUTINE W REFLEX MICROSCOPIC
Bilirubin Urine: NEGATIVE
Glucose, UA: >=500 mg/dL — AB
Ketones, ur: 5 mg/dL — AB
Leukocytes,Ua: NEGATIVE
Nitrite: NEGATIVE
Protein, ur: NEGATIVE mg/dL
Specific Gravity, Urine: 1.031 — ABNORMAL HIGH (ref 1.005–1.030)
WBC, UA: >50 WBC/hpf (ref 0–5)
pH: 6 (ref 5.0–8.0)

## 2024-08-21 LAB — SALICYLATE LEVEL: Salicylate Lvl: <7 mg/dL — ABNORMAL LOW (ref 7.0–30.0)

## 2024-08-21 LAB — CBG MONITORING, ED
Glucose-Capillary: 301 mg/dL — ABNORMAL HIGH (ref 70–99)
Glucose-Capillary: 267 mg/dL — ABNORMAL HIGH (ref 70–99)
Glucose-Capillary: 180 mg/dL — ABNORMAL HIGH (ref 70–99)
Glucose-Capillary: 289 mg/dL — ABNORMAL HIGH (ref 70–99)

## 2024-08-21 LAB — ACETAMINOPHEN LEVEL: Acetaminophen (Tylenol), Serum: <10 ug/mL — ABNORMAL LOW (ref 10–30)

## 2024-08-21 LAB — ETHANOL: Alcohol, Ethyl (B): 263 mg/dL — ABNORMAL HIGH

## 2024-08-21 LAB — BETA HCG QUANT (REF LAB): hCG Quant: 1839 m[IU]/mL

## 2024-08-21 MED ORDER — FOSFOMYCIN TROMETHAMINE 3 G PO PACK
3.0000 g | PACK | Freq: Once | ORAL | Status: AC
  Administered 2024-08-21: 3 g via ORAL
  Filled 2024-08-21: qty 3

## 2024-08-21 MED ORDER — INSULIN GLARGINE 100 UNIT/ML ~~LOC~~ SOLN
14.0000 [IU] | Freq: Every day | SUBCUTANEOUS | Status: DC
  Administered 2024-08-21 – 2024-08-22 (×2): 14 [IU] via SUBCUTANEOUS
  Filled 2024-08-21 (×3): qty 0.14

## 2024-08-21 MED ORDER — SODIUM CHLORIDE 0.9 % IV BOLUS
1000.0000 mL | Freq: Once | INTRAVENOUS | Status: AC
  Administered 2024-08-21: 1000 mL via INTRAVENOUS

## 2024-08-21 MED ORDER — INSULIN ASPART 100 UNIT/ML IJ SOLN
0.0000 [IU] | Freq: Every day | INTRAMUSCULAR | Status: DC
  Administered 2024-08-21: 3 [IU] via SUBCUTANEOUS
  Administered 2024-08-22: 4 [IU] via SUBCUTANEOUS
  Filled 2024-08-21: qty 4
  Filled 2024-08-21: qty 3

## 2024-08-21 MED ORDER — INSULIN ASPART 100 UNIT/ML IJ SOLN
0.0000 [IU] | Freq: Three times a day (TID) | INTRAMUSCULAR | Status: DC
  Administered 2024-08-21: 5 [IU] via SUBCUTANEOUS
  Administered 2024-08-21: 2 [IU] via SUBCUTANEOUS
  Administered 2024-08-22: 5 [IU] via SUBCUTANEOUS
  Administered 2024-08-22: 7 [IU] via SUBCUTANEOUS
  Administered 2024-08-22 – 2024-08-23 (×2): 3 [IU] via SUBCUTANEOUS
  Administered 2024-08-23: 7 [IU] via SUBCUTANEOUS
  Filled 2024-08-21: qty 3
  Filled 2024-08-21: qty 1
  Filled 2024-08-21: qty 2
  Filled 2024-08-21: qty 3
  Filled 2024-08-21 (×2): qty 5
  Filled 2024-08-21: qty 7

## 2024-08-21 NOTE — Inpatient Diabetes Management (Signed)
 Inpatient Diabetes Program Recommendations  AACE/ADA: New Consensus Statement on Inpatient Glycemic Control (2015)  Target Ranges:  Prepandial:   less than 140 mg/dL      Peak postprandial:   less than 180 mg/dL (1-2 hours)      Critically ill patients:  140 - 180 mg/dL   Lab Results  Component Value Date   GLUCAP 301 (H) 08/21/2024   HGBA1C 11.1 (H) 04/29/2024    Review of Glycemic Control  Diabetes history: DM2 Outpatient Diabetes medications: Lantus  20 units daily (has listed in med rec 10-20 tid as needed), Actos  (not taking) Current orders for Inpatient glycemic control: None yet  Inpatient Diabetes Program Recommendations:   Currently in ED. Please consider if admitted: -Lantus  14 units daily (0.2 units/kg x 68 kg) -Novolog  0-9 units tid, 0-5 units hs correction Dm coordinator spoke with patient on prior admission 04/30/24 and explained Lantus  insulin  was to be daily and to followup with her physician if short acting was needed to cover meals.  Thank you, Jarid Sasso E. Tywon Niday, RN, MSN, CNS, CDCES  Diabetes Coordinator Inpatient Glycemic Control Team Team Pager [phone] (8am-5pm) 08/21/2024 11:25 AM

## 2024-08-21 NOTE — Discharge Instructions (Signed)
 Please make sure to follow-up with your OB/GYN for further management of your ongoing miscarriage.  I have placed on resources for you to follow-up for outpatient behavioral health.    Outpatient Substance Use Treatment Services   [facility] [address] [place], [place] [address]  [phone] Private insurance, Medicare A&B, and [facility]   [facility] ([facility])  [address],  [place], [place] [address] [phone] Medicaid, Self Pay   [facility]      [address]  [place], [place] [phone] Medicaid and [facility], Self Pay   [facility] [address]  [place], [place]  [phone] [facility], and Self Pay  [facility]      [address]    [place], [place] [address]  [phone] or [phone] Private Insurance Only                 [facility] [address]  [place], [place]  [phone] Medicaid, Medicare, Private Insurance  [facility] at [facility] [address]  [place], [place] [address] [phone] Services are free or reduced  [facility]  [address] [phone]  Self Pay only, sliding scale  [facility]  [address]  [place], [place] [address] [phone] ([facility]) Self Pay, Medicaid Only   [facility] [address], [place] [address] [phone] Medicaid, Medicare, Private Insurance

## 2024-08-21 NOTE — ED Notes (Signed)
 Serafina from [facility] [phone] called accepted to [facility] can arrive at 08/23/23, Accepting provider Dr. Millie Smoke. Report can be called to [phone] option 2. Informed TTS.

## 2024-08-21 NOTE — BH Assessment (Signed)
 Comprehensive Clinical Assessment (CCA) Note  08/21/2024 Jillian Shepherd [id number]  Chief Complaint: Patient is a 39 year old female presenting to [facility] ED voluntarily. Per triage note Pt to ED via EMS from home with LEO c/o vaginal bleeding and abdominal pain. Pt was told today that she was having a miscarriage at [redacted] weeks pregnant. Police was called out to the home this evening by husband due to patient threatening to harm herself. ETOH and oxycodone  use reported by husband. PT denying thoughts of harming self at this time. AxO4. During assessment patient appears alert and oriented 4, calm and cooperative, tearful and depressed. Patient reports the reason she is presenting to the ED because my husband overreacts, she reports that she was home alone and that her husband was at work at the time he thinks if it isn't his way it's the wrong way, she denies any type of self harming behavior tonight and that her husband called the police while he was at work, I'm at home eating and the police show up at my door talking about I was going to hurt myself. Patient denies ever attempting to hurt herself before in the past. Patient does report some alcohol use I drank a few sips of Garrel Hard lemonade and reports that she doesn't usually drink but that today I just needed to cope, although patient's BAL is 263. Patient reports that her primary care doctor did confirm that she was having a miscarriage today (08/20/24) and patient reports feeling content about it. Patient denies current SI/HI/AH/VH.  Chief Complaint  Patient presents with   Miscarriage   Suicidal   Visit Diagnosis: Depression per hx Alcohol abuse    CCA Screening, Triage and Referral (STR)  Patient Reported Information How did you hear about us ? Other (Comment)  Referral name: No data recorded Referral phone number: No data recorded  Whom do you see for routine medical problems? No data recorded Practice/Facility Name: No data  recorded Practice/Facility Phone Number: No data recorded Name of Contact: No data recorded Contact Number: No data recorded Contact Fax Number: No data recorded Prescriber Name: No data recorded Prescriber Address (if known): No data recorded  What Is the Reason for Your Visit/Call Today? Pt to ED via EMS from home with LEO c/o vaginal bleeding and abdominal pain. Pt was told today that she was having a miscarriage at [redacted] weeks pregnant.  Police was called out to the home this evening by husband due to patient threatening to harm herself. ETOH and oxycodone  use reported by husband. PT denying thoughts of harming self at this time. AxO4.  How Long Has This Been Causing You Problems? > than 6 months  What Do You Feel Would Help You the Most Today? No data recorded  Have You Recently Been in Any Inpatient Treatment (Hospital/Detox/Crisis Center/28-Day Program)? No data recorded Name/Location of Program/Hospital:No data recorded How Long Were You There? No data recorded When Were You Discharged? No data recorded  Have You Ever Received Services From [facility] Before? No data recorded Who Do You See at [facility]? No data recorded  Have You Recently Had Any Thoughts About Hurting Yourself? No  Are You Planning to Commit Suicide/Harm Yourself At This time? No   Have you Recently Had Thoughts About Hurting Someone Sherral? No  Explanation: No data recorded  Have You Used Any Alcohol or Drugs in the Past 24 Hours? Yes  How Long Ago Did You Use Drugs or Alcohol? No data recorded What Did You  Use and How Much? Alcohol   Do You Currently Have a Therapist/Psychiatrist? No data recorded Name of Therapist/Psychiatrist: No data recorded  Have You Been Recently Discharged From Any Office Practice or Programs? No  Explanation of Discharge From Practice/Program: No data recorded    CCA Screening Triage Referral Assessment Type of Contact: No data recorded Is this Initial or Reassessment?  No data recorded Date Telepsych consult ordered in [facility]:  No data recorded Time Telepsych consult ordered in [facility]:  No data recorded  Patient Reported Information Reviewed? No data recorded Patient Left Without Being Seen? No data recorded Reason for Not Completing Assessment: No data recorded  Collateral Involvement: No data recorded  Does Patient Have a Court Appointed Legal Guardian? No data recorded Name and Contact of Legal Guardian: No data recorded If Minor and Not Living with Parent(s), Who has Custody? No data recorded Is CPS involved or ever been involved? Never  Is APS involved or ever been involved? Never   Patient Determined To Be At Risk for Harm To Self or Others Based on Review of Patient Reported Information or Presenting Complaint? Yes, for Self-Harm  Method: No data recorded Availability of Means: No data recorded Intent: No data recorded Notification Required: No data recorded Additional Information for Danger to Others Potential: No data recorded Additional Comments for Danger to Others Potential: No data recorded Are There Guns or Other Weapons in Your Home? No  Types of Guns/Weapons: No data recorded Are These Weapons Safely Secured?                            No data recorded Who Could Verify You Are Able To Have These Secured: No data recorded Do You Have any Outstanding Charges, Pending Court Dates, Parole/Probation? No data recorded Contacted To Inform of Risk of Harm To Self or Others: No data recorded  Location of Assessment: [facility] ED   Does Patient Present under Involuntary Commitment? No  IVC Papers Initial File Date: No data recorded  [place] of Residence: [place]   Patient Currently Receiving the Following Services: No data recorded  Determination of Need: Emergent (2 hours)   Options For Referral: No data recorded    CCA Biopsychosocial Intake/Chief Complaint:  No data recorded Current Symptoms/Problems: No data recorded  Patient  Reported Schizophrenia/Schizoaffective Diagnosis in Past: No   Strengths: Patient is able to communicate her needs; has the support of her husband  Preferences: No data recorded Abilities: No data recorded  Type of Services Patient Feels are Needed: No data recorded  Initial Clinical Notes/Concerns: No data recorded  Mental Health Symptoms Depression:  Change in energy/activity; Fatigue; Tearfulness   Duration of Depressive symptoms: Greater than two weeks   Mania:  None   Anxiety:   Restlessness   Psychosis:  None   Duration of Psychotic symptoms: No data recorded  Trauma:  Avoids reminders of event; Detachment from others; Emotional numbing   Obsessions:  None   Compulsions:  None   Inattention:  None   Hyperactivity/Impulsivity:  None   Oppositional/Defiant Behaviors:  None   Emotional Irregularity:  None   Other Mood/Personality Symptoms:  No data recorded   Mental Status Exam Appearance and self-care  Stature:  Average   Weight:  Average weight   Clothing:  Casual   Grooming:  Normal   Cosmetic use:  None   Posture/gait:  Normal   Motor activity:  Not Remarkable   Sensorium  Attention:  Normal   Concentration:  Normal   Orientation:  X5   Recall/memory:  Normal   Affect and Mood  Affect:  Depressed; Tearful   Mood:  Depressed   Relating  Eye contact:  Normal   Facial expression:  Responsive   Attitude toward examiner:  Cooperative   Thought and Language  Speech flow: Clear and Coherent   Thought content:  Appropriate to Mood and Circumstances   Preoccupation:  None   Hallucinations:  None   Organization:  No data recorded  [employer] of Knowledge:  Fair   Intelligence:  Average   Abstraction:  Normal   Judgement:  Fair   Dance Movement Psychotherapist:  Adequate   Insight:  Lacking   Decision Making:  Normal   Social Functioning  Social Maturity:  Responsible   Social Judgement:  Normal   Stress   Stressors:  Grief/losses; Transitions; Relationship   Coping Ability:  Overwhelmed   Skill Deficits:  None   Supports:  Family     Religion: Religion/Spirituality Are You A Religious Person?: No  Leisure/Recreation: Leisure / Recreation Do You Have Hobbies?: No  Exercise/Diet: Exercise/Diet Do You Exercise?: No Have You Gained or Lost A Significant Amount of Weight in the Past Six Months?: No Do You Follow a Special Diet?: No Do You Have Any Trouble Sleeping?: No   CCA Employment/Education Employment/Work Situation: Employment / Work Situation Has Patient ever Been in Equities Trader?: No  Education: Education Is Patient Currently Attending School?: No Did You Have An Individualized Education Program (IIEP): No Did You Have Any Difficulty At [employer]?: No Patient's Education Has Been Impacted by Current Illness: No   CCA Family/Childhood History Family and Relationship History: Family history Marital status: Married What types of issues is patient dealing with in the relationship?: Patient reports having some marital issues if it's not his way it's the wrong way Additional relationship information: nonw reported Does patient have children?: Yes How many children?: 3  Childhood History:  Childhood History Did patient suffer any verbal/emotional/physical/sexual abuse as a child?: No Did patient suffer from severe childhood neglect?: No Has patient ever been sexually abused/assaulted/raped as an adolescent or adult?: No Was the patient ever a victim of a crime or a disaster?: No Witnessed domestic violence?: No Has patient been affected by domestic violence as an adult?: No  Child/Adolescent Assessment:     CCA Substance Use Alcohol/Drug Use: Alcohol / Drug Use Pain Medications: see mar Prescriptions: see mar Over the Counter: see mar History of alcohol / drug use?: Yes Substance #1 Name of Substance 1: Alcohol                        ASAM's:  Six Dimensions of Multidimensional Assessment  Dimension 1:  Acute Intoxication and/or Withdrawal Potential:      Dimension 2:  Biomedical Conditions and Complications:      Dimension 3:  Emotional, Behavioral, or Cognitive Conditions and Complications:     Dimension 4:  Readiness to Change:     Dimension 5:  Relapse, Continued use, or Continued Problem Potential:     Dimension 6:  Recovery/Living Environment:     ASAM Severity Score:    ASAM Recommended Level of Treatment:     Substance use Disorder (SUD)    Recommendations for Services/Supports/Treatments:    DSM5 Diagnoses: Patient Active Problem List   Diagnosis Date Noted   Positive urine pregnancy test 08/04/2024   Cigarette nicotine  dependence without  complication 08/04/2024   Hypertension 08/04/2024   Tobacco abuse 04/28/2024   Hypocalcemia 03/25/2024   Type 2 diabetes mellitus with hyperglycemia (HCC) 02/01/2024   UTI (urinary tract infection) 02/01/2024   GERD (gastroesophageal reflux disease) 04/05/2023   Elevated lipase 04/05/2023   Chronic alcohol use 04/05/2023   Pseudocyst of pancreas due to acute pancreatitis 04/05/2023   PICC line infection, initial encounter 11/07/2022   Abdominal pain, epigastric 08/19/2022   Acute recurrent pancreatitis 08/15/2022   Pancreatitis, necrotizing 08/15/2022   Cavernous transformation of portal vein 07/24/2022   Leukocytosis 07/21/2022   Chronic pancreatitis (HCC) 07/18/2022   Dehydration 07/07/2022   Diabetes mellitus type II, controlled (HCC) 07/01/2022   Acute on chronic pancreatitis 06/30/2022   Malnutrition of moderate degree 06/12/2022   Pancreatic pseudocyst 06/11/2022   Acute hypoxic respiratory failure (HCC) 06/10/2022   Pleural effusion on left 06/10/2022   Spleen hematoma 06/08/2022   Acute pancreatitis 05/31/2022   History of splenic vein thrombosis 05/31/2022   Liddle's syndrome 05/31/2022   Hypoxia 05/11/2022   Abdominal pain 05/10/2022    Nausea & vomiting 05/10/2022   Hypokalemia 05/10/2022   Hypomagnesemia 05/10/2022   Prolonged QT interval 05/10/2022   Hyperglycemia 05/10/2022   Alcohol abuse 05/10/2022   Acute alcoholic pancreatitis 05/10/2022   Diarrhea    Pancreatitis, acute 07/27/2020   Sacral back pain 09/14/2019   Bilateral ovarian cysts 09/02/2018   Elevated blood pressure reading without diagnosis of hypertension 01/20/2015   Essential hypertension 07/07/2014   Depression 07/07/2014   Obesity 07/07/2014    Patient Centered Plan: Patient is on the following Treatment Plan(s):  Depression and Substance Abuse   Referrals to Alternative Service(s): Referred to Alternative Service(s):   Place:   Date:   Time:    Referred to Alternative Service(s):   Place:   Date:   Time:    Referred to Alternative Service(s):   Place:   Date:   Time:    Referred to Alternative Service(s):   Place:   Date:   Time:      @BHCOLLABOFCARE @  [employer], LCAS-A

## 2024-08-21 NOTE — BH Assessment (Signed)
 This Clinical research associate contacted [facility] via phone to request an assessment, request has been made, assessment is currently pending

## 2024-08-21 NOTE — Progress Notes (Signed)
 Per AC (Danika), there is no bed availability within [facility] system.  Patient has been referred to the following facilities:   Service Provider Phone  [facility]  [phone]  [facility]  [phone]  [facility]  [phone]  [facility]  [phone]  [facility]  [phone]  [facility]  [phone]  [facility]  [phone]  [facility]  [phone]  [facility]  [phone]  [facility]  [phone]  [facility]  [phone]  [place] [facility]  [phone]  [facility]  [phone]  [facility]  [phone]  [facility]  [phone], [place] [phone]

## 2024-08-21 NOTE — ED Notes (Signed)
 Dinner tray provided to patient

## 2024-08-21 NOTE — ED Notes (Signed)
 [facility] IVC if Pt attempts to leave .

## 2024-08-21 NOTE — Consult Note (Signed)
 [facility] Psychiatric Consult Initial  Patient Name: .Jillian Shepherd  MRN: [id number]  DOB: 01-Feb-1986  Consult Order details:  Orders (From admission, onward)     Start     Ordered   08/20/24 2336  CONSULT TO CALL ACT TEAM       Ordering Provider: Floy Roberts, MD  Provider:  (Not yet assigned)  Question:  Reason for Consult?  Answer:  concern for self harm   08/20/24 2335   08/20/24 2336  IP CONSULT TO PSYCHIATRY       Ordering Provider: Floy Roberts, MD  Provider:  (Not yet assigned)  Question Answer Comment  Reason for consult: Other (see comments)   Comments: concern for self harm      08/20/24 2335             Mode of Visit: Tele-visit Location of Provider home office    Psychiatry Consult Evaluation  Service Date: August 21, 2024 LOS:  LOS: 0 days  Chief Complaint I had a miscarriage  Primary Psychiatric Diagnoses   Alcohol use disorder severe/adjustment disorder with depressed mood / substance induced mood disorder   Assessment   Jillian Shepherd is a 39 y.o. female admitted: Presented to the EDfor 08/20/2024 10:15 PM for  suicidal ideation and aggressive behaviors. She carries the psychiatric diagnoses of  alcoholism and has a past medical history of   as below.     Diagnoses:    Alcohol use disorder severe    Plan   ## Psychiatric Medication Recommendations:   Recommend CIWA protocol / psychiatric inpatient admission  ## Medical Decision Making Capacity: Not specifically addressed in this encounter     ## Disposition:-- We recommend inpatient psychiatric hospitalization when medically cleared. Patient is under voluntary admission status at this time; please IVC if attempts to leave hospital.  ## Behavioral / Environmental: - No specific recommendations at this time.     ## Safety and Observation Level:  - Based on my clinical evaluation, I estimate the patient to be at  moderate risk of self harm in the current setting. - At this  time, we recommend  routine. This decision is based on my review of the chart including patient's history and current presentation, interview of the patient, mental status examination, and consideration of suicide risk including evaluating suicidal ideation, plan, intent, suicidal or self-harm behaviors, risk factors, and protective factors. This judgment is based on our ability to directly address suicide risk, implement suicide prevention strategies, and develop a safety plan while the patient is in the clinical setting. Please contact our team if there is a concern that risk level has changed.  CSSR Risk Category:C-SSRS RISK CATEGORY: Low Risk  Suicide Risk Assessment: Patient has following modifiable risk factors for suicide: untreated depression, which we are addressing by  CIWA protocol/ close observation/inpatient admission. Patient has following non-modifiable or demographic risk factors for suicide: history of self harm behavior Patient has the following protective factors against suicide: Supportive family  Thank you for this consult request. Recommendations have been communicated to the primary team.  We will  sign off at this time.   Jillian JONELLE Manners, MD       History of Present Illness    Patient is a 39 year old female who came in after significant aggressive behaviors at home she reportedly has been drinking excessively and in this context he became very aggressive towards her husband and also reportedly had a miscarriage and became very depressed and started talking about suicide  continues to minimize behaviors but her behaviors have significantly escalated in the recent past no symptoms of mania hypomania psychosis.    Psychiatric and Social History  Psychiatric History:   Significant history of depression and suicidal ideation/alcohol abuse  Social History:   Lives with her husband Access to weapons/lethal means:   None   Substance History Alcohol:  drinks daily history  of seizures  Exam Findings  Physical Exam: Reviewed and agree with the physical exam findings conducted by the medical provider Vital Signs:  Temp:  [98.2 F (36.8 C)-98.3 F (36.8 C)] 98.2 F (36.8 C) (01/16 0715) Pulse Rate:  [80-98] 80 (01/16 1228) Resp:  [15-16] 15 (01/16 0715) BP: (110-129)/(71-89) 129/89 (01/16 1228) SpO2:  [95 %-98 %] 95 % (01/16 0715) Weight:  [68 kg] 68 kg (01/15 2244) Blood pressure 129/89, pulse 80, temperature 98.2 F (36.8 C), temperature source Oral, resp. rate 15, height 5' 4 (1.626 m), weight 68 kg, last menstrual period 05/25/2024, SpO2 95%. Body mass index is 25.75 kg/m.    Mental Status Exam: General Appearance: Disheveled  Orientation:  Full (Time, Place, and Person)  Memory:  Negative  Concentration:  Concentration: Fair  Recall:  Fair  Attention  Fair  Eye Contact:  Fair  Speech:  Normal Rate  Language:  Fair  Volume:  Normal  Mood:  sad  Affect:  Congruent  Thought Process:  Coherent  Thought Content:  WDL  Suicidal Thoughts:  No  Homicidal Thoughts:  No  Judgement:  Poor  Insight:  Shallow  Psychomotor Activity:  Negative  Akathisia:  Negative  Fund of Knowledge:  Fair      Assets:  Housing  Cognition:  WNL  ADL's:  Intact  AIMS (if indicated):        Other History   These have been pulled in through the EMR, reviewed, and updated if appropriate.  Family History:  The patient's family history includes Anxiety disorder in her father; Cirrhosis in her paternal uncle; Depression in her father; Diabetes in her paternal grandmother; Hypertension in her father; Melanoma in her paternal grandmother.  Medical History: Past Medical History:  Diagnosis Date   Abdominal pain    Alcohol abuse    B12 deficiency    Cancer (HCC)    squmaous cell skin cancer    Collagen vascular disease    Diabetes mellitus without complication (HCC)    Hypertension    Hypokalemia    Liddle's syndrome    Obesity (BMI 35.0-39.9 without  comorbidity)    Pancreatitis    Pleural effusion    Splenic hemorrhage     Surgical History: Past Surgical History:  Procedure Laterality Date   ADENOIDECTOMY     CHEST TUBE INSERTION     COLONOSCOPY WITH PROPOFOL  N/A 10/06/2020   Procedure: COLONOSCOPY WITH PROPOFOL ;  Surgeon: Unk Corinn Skiff, MD;  Location: [facility] ENDOSCOPY;  Service: Gastroenterology;  Laterality: N/A;  COVID POSITIVE 08/23/2020   ESOPHAGOGASTRODUODENOSCOPY (EGD) WITH PROPOFOL  N/A 10/06/2020   Procedure: ESOPHAGOGASTRODUODENOSCOPY (EGD) WITH PROPOFOL ;  Surgeon: Unk Corinn Skiff, MD;  Location: [facility] ENDOSCOPY;  Service: Gastroenterology;  Laterality: N/A;   skin cancer removal Left    leg   TONSILLECTOMY       Medications:  Current Medications[1]  Allergies: Allergies[2]  Rukaya Kleinschmidt R Jerry Clyne, MD     [1]  Current Facility-Administered Medications:    insulin  aspart (novoLOG ) injection 0-5 Units, 0-5 Units, Subcutaneous, QHS, Tan, Ting Xu, MD   insulin  aspart (novoLOG ) injection 0-9 Units, 0-9  Units, Subcutaneous, TID WC, Tan, Lorelle Cummins, MD, 5 Units at 08/21/24 1242   insulin  glargine (LANTUS ) injection 14 Units, 14 Units, Subcutaneous, QHS, Tan, Lorelle Cummins, MD  Current Outpatient Medications:    acetaminophen  (TYLENOL ) 500 MG tablet, Take 1,000 mg by mouth every 6 (six) hours as needed for mild pain (pain score 1-3)., Disp: , Rfl:    calcium  carbonate (TUMS - DOSED IN MG ELEMENTAL CALCIUM ) 500 MG chewable tablet, Chew 2 tablets by mouth daily., Disp: , Rfl:    insulin  glargine (LANTUS  SOLOSTAR) 100 UNIT/ML Solostar Pen, Inject 20 Units into the skin daily. (Patient taking differently: Inject 10-20 Units into the skin 3 (three) times daily as needed (high blood sugar). Use at least once daily), Disp: 15 mL, Rfl: 5   labetalol  (NORMODYNE ) 100 MG tablet, Take 1 tablet (100 mg total) by mouth 2 (two) times daily., Disp: 60 tablet, Rfl: 1   Multiple Vitamin (MULTIVITAMIN WITH MINERALS) TABS tablet, Take 1 tablet by  mouth daily., Disp: , Rfl:    oxyCODONE  (ROXICODONE ) 5 MG immediate release tablet, Take 1 tablet (5 mg total) by mouth every 6 (six) hours as needed for severe pain (pain score 7-10), moderate pain (pain score 4-6) or breakthrough pain., Disp: 20 tablet, Rfl: 0   ACCU-CHEK GUIDE TEST test strip, USE IN THE MORNING, AT NOON, AND AT BEDTIME. MAY SUBSTITUTE TO ANY MANUFACTURER, Disp: 100 strip, Rfl: 0   Accu-Chek Softclix Lancets lancets, USE IN THE MORNING, AT NOON, AND AT BEDTIME. MAY SUBSTITUTE, Disp: 100 each, Rfl: 3   BD PEN NEEDLE SHORT ULTRAFINE 31G X 8 MM MISC, USE TO ADMINISTER INSULIN  DAILY., Disp: 100 each, Rfl: 1   Blood Glucose Monitoring Suppl DEVI, 1 each by Does not apply route in the morning, at noon, and at bedtime. May substitute to any manufacturer covered by patient's insurance., Disp: 1 each, Rfl: 0   Blood Glucose Monitoring Suppl DEVI, 1 each by Does not apply route in the morning, at noon, and at bedtime. May substitute to any manufacturer covered by patient's insurance., Disp: 1 each, Rfl: 0   Continuous Glucose Sensor (FREESTYLE LIBRE 3 PLUS SENSOR) MISC, Change sensor every 15 days. (Patient not taking: Reported on 08/04/2024), Disp: 2 each, Rfl: 5   folic acid  (FOLVITE ) 1 MG tablet, Take 1 tablet (1 mg total) by mouth daily. (Patient not taking: Reported on 08/04/2024), Disp: 30 tablet, Rfl: 1   lipase/protease/amylase (CREON ) 36000 UNITS CPEP capsule, Take 2 capsules (72,000 Units total) by mouth 3 (three) times daily with meals. (Patient not taking: Reported on 08/21/2024), Disp: 300 capsule, Rfl: 2   lisinopril  (ZESTRIL ) 40 MG tablet, Take 1 tablet (40 mg total) by mouth daily. (Patient not taking: No sig reported), Disp: 30 tablet, Rfl: 1   pioglitazone  (ACTOS ) 30 MG tablet, Take 1 tablet (30 mg total) by mouth daily. (Patient not taking: Reported on 08/04/2024), Disp: 30 tablet, Rfl: 3 [2] No Known Allergies

## 2024-08-21 NOTE — BH Assessment (Signed)
 Per the report of the patient's husband ([phone]), last night (08/20/2024) he contacted 911 because the patient was intoxicated, aggressive towards him and had urinated in the living room trash can. For the last two weeks she has been saying she was depressed and wanted to die.  When law enforcement arrived, the patient told the husband to kill her, and that is when law enforcement felt she needed to come to the ER. The husband further shared, three years ago her doctor told her she was having problems with her pancreas, due to her drinking. The drinking increased two years ago, following the death of her mother. He reports of wanting to get her help for the drinking but she continued to refuse. The husband also shared, the patient doesn't allow her medical providers to talk with him because he told them about his concerns of her been prescribed opioids. Thus, he is unsure when the miscarriage took place. She has been bleeding for a while but was refusing to go to the doctor. He thinks the miscarriage took place approximately two days ago but uncertain. Husband states she have been on antidepressant but he doesn't know what they are, nor what has worked or haven't worked.

## 2024-08-21 NOTE — ED Provider Notes (Addendum)
 Patient was initially seen by psychiatry, recommended discharge with outpatient follow-up.  Psychiatry in speaking to counselor who was concern that patient was minimizing her symptoms and refusing to go to the doctor.  Psychiatry is recommending inpatient admission at this time.   Waymond Lorelle Cummins, MD 08/21/24 1046    Waymond Lorelle Cummins, MD 08/21/24 1056

## 2024-08-22 DIAGNOSIS — F1994 Other psychoactive substance use, unspecified with psychoactive substance-induced mood disorder: Secondary | ICD-10-CM | POA: Diagnosis not present

## 2024-08-22 DIAGNOSIS — F1090 Alcohol use, unspecified, uncomplicated: Secondary | ICD-10-CM | POA: Diagnosis not present

## 2024-08-22 DIAGNOSIS — F4323 Adjustment disorder with mixed anxiety and depressed mood: Secondary | ICD-10-CM | POA: Diagnosis not present

## 2024-08-22 LAB — HCG, QUANTITATIVE, PREGNANCY: hCG, Beta Chain, Quant, S: 183 m[IU]/mL — ABNORMAL HIGH

## 2024-08-22 LAB — CBG MONITORING, ED
Glucose-Capillary: 271 mg/dL — ABNORMAL HIGH (ref 70–99)
Glucose-Capillary: 220 mg/dL — ABNORMAL HIGH (ref 70–99)
Glucose-Capillary: 349 mg/dL — ABNORMAL HIGH (ref 70–99)
Glucose-Capillary: 322 mg/dL — ABNORMAL HIGH (ref 70–99)

## 2024-08-22 MED ORDER — ADULT MULTIVITAMIN W/MINERALS CH
1.0000 | ORAL_TABLET | Freq: Every day | ORAL | Status: DC
  Administered 2024-08-22: 1 via ORAL
  Filled 2024-08-22: qty 1

## 2024-08-22 MED ORDER — THIAMINE HCL 100 MG/ML IJ SOLN
100.0000 mg | Freq: Every day | INTRAMUSCULAR | Status: DC
  Filled 2024-08-22 (×2): qty 1

## 2024-08-22 MED ORDER — THIAMINE MONONITRATE 100 MG PO TABS
100.0000 mg | ORAL_TABLET | Freq: Every day | ORAL | Status: DC
  Administered 2024-08-22 – 2024-08-23 (×2): 100 mg via ORAL
  Filled 2024-08-22 (×2): qty 1

## 2024-08-22 MED ORDER — FOLIC ACID 1 MG PO TABS
1.0000 mg | ORAL_TABLET | Freq: Every day | ORAL | Status: DC
  Administered 2024-08-22 – 2024-08-23 (×2): 1 mg via ORAL
  Filled 2024-08-22 (×2): qty 1

## 2024-08-22 MED ORDER — LORAZEPAM 2 MG PO TABS: 0.0000 mg | ORAL_TABLET | Freq: Two times a day (BID) | ORAL | Status: DC

## 2024-08-22 MED ORDER — ACETAMINOPHEN 500 MG PO TABS
1000.0000 mg | ORAL_TABLET | Freq: Once | ORAL | Status: AC
  Administered 2024-08-22: 1000 mg via ORAL
  Filled 2024-08-22: qty 2

## 2024-08-22 MED ORDER — NICOTINE 21 MG/24HR TD PT24
21.0000 mg | MEDICATED_PATCH | Freq: Once | TRANSDERMAL | Status: AC
  Administered 2024-08-22: 21 mg via TRANSDERMAL
  Filled 2024-08-22: qty 1

## 2024-08-22 MED ORDER — MELATONIN 5 MG PO TABS
5.0000 mg | ORAL_TABLET | Freq: Every day | ORAL | Status: DC
  Administered 2024-08-22: 5 mg via ORAL
  Filled 2024-08-22: qty 1

## 2024-08-22 MED ORDER — LORAZEPAM 2 MG PO TABS
0.0000 mg | ORAL_TABLET | Freq: Four times a day (QID) | ORAL | Status: DC
  Administered 2024-08-22: 1 mg via ORAL
  Filled 2024-08-22: qty 1

## 2024-08-22 NOTE — ED Notes (Signed)
 Breakfast tray provided, SSI coverage provided prior to eating.

## 2024-08-22 NOTE — ED Notes (Signed)
 Lunch tray provided.

## 2024-08-22 NOTE — BH Assessment (Signed)
 TTS spoke with [facility] ([facility]), they are aware patient will not be able to transport today and they will hold her bed for tomorrow.

## 2024-08-22 NOTE — TOC Initial Note (Signed)
 Transition of Care [facility]) - Initial/Assessment Note    Patient Details  Name: Jillian Shepherd MRN: [id number] Date of Birth: 20-Jan-1986  [facility]) CM/SW Contact:    Emary Zalar L Orlie Cundari, LCSW Phone Number: 08/22/2024, 2:04 PM  Clinical Narrative:                    [facility] consult received for substance abuse education/counseling. TOC does not provided education/counseling. Resources have been added to the AVS for patient to follow-up.   Patient is on psych hold.      Patient Goals and CMS Choice            Expected Discharge Plan and Services                                              Prior Living Arrangements/Services                       Activities of Daily Living      Permission Sought/Granted                  Emotional Assessment              Admission diagnosis:  IVC Patient Active Problem List   Diagnosis Date Noted   Positive urine pregnancy test 08/04/2024   Cigarette nicotine  dependence without complication 08/04/2024   Hypertension 08/04/2024   Tobacco abuse 04/28/2024   Hypocalcemia 03/25/2024   Type 2 diabetes mellitus with hyperglycemia (HCC) 02/01/2024   UTI (urinary tract infection) 02/01/2024   GERD (gastroesophageal reflux disease) 04/05/2023   Elevated lipase 04/05/2023   Chronic alcohol use 04/05/2023   Pseudocyst of pancreas due to acute pancreatitis 04/05/2023   PICC line infection, initial encounter 11/07/2022   Abdominal pain, epigastric 08/19/2022   Acute recurrent pancreatitis 08/15/2022   Pancreatitis, necrotizing 08/15/2022   Cavernous transformation of portal vein 07/24/2022   Leukocytosis 07/21/2022   Chronic pancreatitis (HCC) 07/18/2022   Dehydration 07/07/2022   Diabetes mellitus type II, controlled (HCC) 07/01/2022   Acute on chronic pancreatitis 06/30/2022   Malnutrition of moderate degree 06/12/2022   Pancreatic pseudocyst 06/11/2022   Acute hypoxic respiratory failure (HCC)  06/10/2022   Pleural effusion on left 06/10/2022   Spleen hematoma 06/08/2022   Acute pancreatitis 05/31/2022   History of splenic vein thrombosis 05/31/2022   Liddle's syndrome 05/31/2022   Hypoxia 05/11/2022   Abdominal pain 05/10/2022   Nausea & vomiting 05/10/2022   Hypokalemia 05/10/2022   Hypomagnesemia 05/10/2022   Prolonged QT interval 05/10/2022   Hyperglycemia 05/10/2022   Alcohol abuse 05/10/2022   Acute alcoholic pancreatitis 05/10/2022   Diarrhea    Pancreatitis, acute 07/27/2020   Sacral back pain 09/14/2019   Bilateral ovarian cysts 09/02/2018   Elevated blood pressure reading without diagnosis of hypertension 01/20/2015   Essential hypertension 07/07/2014   Depression 07/07/2014   Obesity 07/07/2014   PCP:  Duanne Butler DASEN, MD Pharmacy:   [facility] [phone] - [place], [place] - [address] AT [facility] [address] [place] [address] Phone: [phone] Fax: [phone]     Social Drivers of Health (SDOH) Social History: SDOH Screenings   Food Insecurity: No Food Insecurity (04/28/2024)  Housing: Low Risk (04/28/2024)  Recent Concern: Housing - High Risk (03/26/2024)  Transportation Needs: No Transportation Needs (04/28/2024)  Utilities: Not At  Risk (04/28/2024)  Depression (PHQ2-9): Low Risk (08/04/2024)  Social Connections: Socially Integrated (02/01/2024)  Tobacco Use: High Risk (08/04/2024)   SDOH Interventions:     Readmission Risk Interventions    04/30/2024    9:43 AM 04/29/2024    2:58 PM 03/27/2024    9:58 AM  Readmission Risk Prevention Plan  Transportation Screening Complete Complete Complete  PCP or Specialist Appt within 3-5 Days  Not Complete   Home Care Screening Complete    Medication Review (RN CM) Complete    HRI or Home Care Consult  Complete Complete  Social Work Consult for Recovery Care Planning/Counseling  Complete Complete  Palliative Care Screening  Not Applicable Not Applicable  Medication Review Special Educational Needs Teacher)  Complete Complete

## 2024-08-22 NOTE — Consult Note (Signed)
 [facility] Psychiatric Consult Initial  Patient Name: .Jillian Shepherd  MRN: [id number]  DOB: 02/02/1986  Consult Order details:  Orders (From admission, onward)     Start     Ordered   08/20/24 2336  CONSULT TO CALL ACT TEAM       Ordering Provider: Floy Roberts, MD  Provider:  (Not yet assigned)  Question:  Reason for Consult?  Answer:  concern for self harm   08/20/24 2335   08/20/24 2336  IP CONSULT TO PSYCHIATRY       Ordering Provider: Floy Roberts, MD  Provider:  (Not yet assigned)  Question Answer Comment  Reason for consult: Other (see comments)   Comments: concern for self harm      08/20/24 2335             Mode of Visit: Tele-visit Location of Provider home office    Psychiatry Consult Evaluation  Service Date: August 22, 2024 LOS:  LOS: 0 days  Chief Complaint I had a miscarriage  Primary Psychiatric Diagnoses   Alcohol use disorder severe/adjustment disorder with depressed mood / substance induced mood disorder   Assessment   Jillian Shepherd is a 39 y.o. female admitted: Presented to the EDfor 08/20/2024 10:15 PM for  suicidal ideation and aggressive behaviors. She carries the psychiatric diagnoses of  alcoholism and has a past medical history of   as below.   08/22/2024: Patient seen on rounds today by psychiatry.  According to nursing staff, patient was unable to transport to inpatient psychiatric unit today as planned due to lack of transportation.  This was explained to patient by this provider on today's rounds.  Patient verbalized understanding at this time.  Patient had very tearful depressed affect and reported ongoing depression.  She reported a lot of increased stressors currently as noted in previous notes.  Patient was tearful but remained cooperative with this provider.  Patient denied any homicidal ideations at this time as well as auditory or visual hallucinations.  Review of chart was conducted by this provider and it was noted that  patient with history of alcohol use disorder/ recent alcohol use.  Due to this, CIWA protocol was ordered at this time.  We discussed that patient is IVC currently and recommended for inpatient psychiatric admission.  Patient tearful as stated earlier, but verbalized understanding of current plan.  Of note, all other portions of this note were completed on patient's initial assessment by previous provider.    Diagnoses:    Alcohol use disorder severe    Plan   ## Psychiatric Medication Recommendations:   Recommend CIWA protocol / psychiatric inpatient admission  ## Medical Decision Making Capacity: Not specifically addressed in this encounter     ## Disposition:-- We recommend inpatient psychiatric hospitalization when medically cleared. Patient is under voluntary admission status at this time; please IVC if attempts to leave hospital.  ## Behavioral / Environmental: - No specific recommendations at this time.     ## Safety and Observation Level:  - Based on my clinical evaluation, I estimate the patient to be at  moderate risk of self harm in the current setting. - At this time, we recommend  routine. This decision is based on my review of the chart including patient's history and current presentation, interview of the patient, mental status examination, and consideration of suicide risk including evaluating suicidal ideation, plan, intent, suicidal or self-harm behaviors, risk factors, and protective factors. This judgment is based on our ability to directly address  suicide risk, implement suicide prevention strategies, and develop a safety plan while the patient is in the clinical setting. Please contact our team if there is a concern that risk level has changed.  CSSR Risk Category:C-SSRS RISK CATEGORY: Low Risk  Suicide Risk Assessment: Patient has following modifiable risk factors for suicide: untreated depression, which we are addressing by  CIWA protocol/ close  observation/inpatient admission. Patient has following non-modifiable or demographic risk factors for suicide: history of self harm behavior Patient has the following protective factors against suicide: Supportive family  Thank you for this consult request. Recommendations have been communicated to the primary team.  We will  sign off at this time.   Zelda Sharps, NP        History of Present Illness    Patient is a 39 year old female who came in after significant aggressive behaviors at home she reportedly has been drinking excessively and in this context he became very aggressive towards her husband and also reportedly had a miscarriage and became very depressed and started talking about suicide continues to minimize behaviors but her behaviors have significantly escalated in the recent past no symptoms of mania hypomania psychosis.    Psychiatric and Social History  Psychiatric History:   Significant history of depression and suicidal ideation/alcohol abuse  Social History:   Lives with her husband Access to weapons/lethal means:   None   Substance History Alcohol:  drinks daily history of seizures  Exam Findings  Physical Exam: Reviewed and agree with the physical exam findings conducted by the medical provider Vital Signs:  Temp:  [97.9 F (36.6 C)-98 F (36.7 C)] 97.9 F (36.6 C) (01/17 0953) Pulse Rate:  [83] 83 (01/17 0953) Resp:  [14-16] 16 (01/17 0953) BP: (115-132)/(72-92) 132/92 (01/17 0953) SpO2:  [96 %-98 %] 96 % (01/17 0953) Blood pressure (!) 132/92, pulse 83, temperature 97.9 F (36.6 C), temperature source Oral, resp. rate 16, height 5' 4 (1.626 m), weight 68 kg, last menstrual period 05/25/2024, SpO2 96%. Body mass index is 25.75 kg/m.    Mental Status Exam: General Appearance: Disheveled  Orientation:  Full (Time, Place, and Person)  Memory:  Negative  Concentration:  Concentration: Fair  Recall:  Fair  Attention  Fair  Eye Contact:  Fair   Speech:  Normal Rate  Language:  Fair  Volume:  Normal  Mood:  sad  Affect:  Congruent  Thought Process:  Coherent  Thought Content:  WDL  Suicidal Thoughts:  No  Homicidal Thoughts:  No  Judgement:  Poor  Insight:  Shallow  Psychomotor Activity:  Negative  Akathisia:  Negative  Fund of Knowledge:  Fair      Assets:  Housing  Cognition:  WNL  ADL's:  Intact  AIMS (if indicated):        Other History   These have been pulled in through the EMR, reviewed, and updated if appropriate.  Family History:  The patient's family history includes Anxiety disorder in her father; Cirrhosis in her paternal uncle; Depression in her father; Diabetes in her paternal grandmother; Hypertension in her father; Melanoma in her paternal grandmother.  Medical History: Past Medical History:  Diagnosis Date   Abdominal pain    Alcohol abuse    B12 deficiency    Cancer (HCC)    squmaous cell skin cancer    Collagen vascular disease    Diabetes mellitus without complication (HCC)    Hypertension    Hypokalemia    Liddle's syndrome    Obesity (  BMI 35.0-39.9 without comorbidity)    Pancreatitis    Pleural effusion    Splenic hemorrhage     Surgical History: Past Surgical History:  Procedure Laterality Date   ADENOIDECTOMY     CHEST TUBE INSERTION     COLONOSCOPY WITH PROPOFOL  N/A 10/06/2020   Procedure: COLONOSCOPY WITH PROPOFOL ;  Surgeon: Unk Corinn Skiff, MD;  Location: [facility] ENDOSCOPY;  Service: Gastroenterology;  Laterality: N/A;  COVID POSITIVE 08/23/2020   ESOPHAGOGASTRODUODENOSCOPY (EGD) WITH PROPOFOL  N/A 10/06/2020   Procedure: ESOPHAGOGASTRODUODENOSCOPY (EGD) WITH PROPOFOL ;  Surgeon: Unk Corinn Skiff, MD;  Location: [facility] ENDOSCOPY;  Service: Gastroenterology;  Laterality: N/A;   skin cancer removal Left    leg   TONSILLECTOMY       Medications:  Current Medications[1]  Allergies: Allergies[2] Zelda Sharps, NP This note was created using Dragon dictation software.  Please excuse any inadvertent transcription errors. Case was discussed with supervising physician Dr. Ruther who is agreeable with current plan.       [1]  Current Facility-Administered Medications:    folic acid  (FOLVITE ) tablet 1 mg, 1 mg, Oral, Daily, Zayley Arras B, NP   insulin  aspart (novoLOG ) injection 0-5 Units, 0-5 Units, Subcutaneous, QHS, Tan, Ting Xu, MD, 3 Units at 08/21/24 2201   insulin  aspart (novoLOG ) injection 0-9 Units, 0-9 Units, Subcutaneous, TID WC, Tan, Lorelle Cummins, MD, 3 Units at 08/22/24 1121   insulin  glargine (LANTUS ) injection 14 Units, 14 Units, Subcutaneous, QHS, Tan, Ting Xu, MD, 14 Units at 08/21/24 2203   LORazepam  (ATIVAN ) tablet 0-4 mg, 0-4 mg, Oral, Q6H **FOLLOWED BY** [START ON 08/24/2024] LORazepam  (ATIVAN ) tablet 0-4 mg, 0-4 mg, Oral, Q12H, Sharps Zelda B, NP   multivitamin with minerals tablet 1 tablet, 1 tablet, Oral, Daily, Maleeah Crossman B, NP   nicotine  (NICODERM CQ  - dosed in mg/24 hours) patch 21 mg, 21 mg, Transdermal, Once, Sharps Rover, MD, 21 mg at 08/22/24 1112   thiamine  (VITAMIN B1) tablet 100 mg, 100 mg, Oral, Daily **OR** thiamine  (VITAMIN B1) injection 100 mg, 100 mg, Intravenous, Daily, Sharps Zelda B, NP  Current Outpatient Medications:    acetaminophen  (TYLENOL ) 500 MG tablet, Take 1,000 mg by mouth every 6 (six) hours as needed for mild pain (pain score 1-3)., Disp: , Rfl:    calcium  carbonate (TUMS - DOSED IN MG ELEMENTAL CALCIUM ) 500 MG chewable tablet, Chew 2 tablets by mouth daily., Disp: , Rfl:    insulin  glargine (LANTUS  SOLOSTAR) 100 UNIT/ML Solostar Pen, Inject 20 Units into the skin daily. (Patient taking differently: Inject 10-20 Units into the skin 3 (three) times daily as needed (high blood sugar). Use at least once daily), Disp: 15 mL, Rfl: 5   labetalol  (NORMODYNE ) 100 MG tablet, Take 1 tablet (100 mg total) by mouth 2 (two) times daily., Disp: 60 tablet, Rfl: 1   Multiple Vitamin (MULTIVITAMIN WITH MINERALS) TABS tablet, Take 1  tablet by mouth daily., Disp: , Rfl:    oxyCODONE  (ROXICODONE ) 5 MG immediate release tablet, Take 1 tablet (5 mg total) by mouth every 6 (six) hours as needed for severe pain (pain score 7-10), moderate pain (pain score 4-6) or breakthrough pain., Disp: 20 tablet, Rfl: 0   ACCU-CHEK GUIDE TEST test strip, USE IN THE MORNING, AT NOON, AND AT BEDTIME. MAY SUBSTITUTE TO ANY MANUFACTURER, Disp: 100 strip, Rfl: 0   Accu-Chek Softclix Lancets lancets, USE IN THE MORNING, AT NOON, AND AT BEDTIME. MAY SUBSTITUTE, Disp: 100 each, Rfl: 3   BD PEN NEEDLE SHORT ULTRAFINE 31G X  8 MM MISC, USE TO ADMINISTER INSULIN  DAILY., Disp: 100 each, Rfl: 1   Blood Glucose Monitoring Suppl DEVI, 1 each by Does not apply route in the morning, at noon, and at bedtime. May substitute to any manufacturer covered by patient's insurance., Disp: 1 each, Rfl: 0   Blood Glucose Monitoring Suppl DEVI, 1 each by Does not apply route in the morning, at noon, and at bedtime. May substitute to any manufacturer covered by patient's insurance., Disp: 1 each, Rfl: 0   Continuous Glucose Sensor (FREESTYLE LIBRE 3 PLUS SENSOR) MISC, Change sensor every 15 days. (Patient not taking: Reported on 08/04/2024), Disp: 2 each, Rfl: 5   folic acid  (FOLVITE ) 1 MG tablet, Take 1 tablet (1 mg total) by mouth daily. (Patient not taking: Reported on 08/04/2024), Disp: 30 tablet, Rfl: 1   lipase/protease/amylase (CREON ) 36000 UNITS CPEP capsule, Take 2 capsules (72,000 Units total) by mouth 3 (three) times daily with meals. (Patient not taking: Reported on 08/21/2024), Disp: 300 capsule, Rfl: 2   lisinopril  (ZESTRIL ) 40 MG tablet, Take 1 tablet (40 mg total) by mouth daily. (Patient not taking: No sig reported), Disp: 30 tablet, Rfl: 1   pioglitazone  (ACTOS ) 30 MG tablet, Take 1 tablet (30 mg total) by mouth daily. (Patient not taking: Reported on 08/04/2024), Disp: 30 tablet, Rfl: 3 [2] No Known Allergies

## 2024-08-22 NOTE — ED Notes (Signed)
 Dinner tray provided to pt

## 2024-08-22 NOTE — ED Notes (Signed)
 Pt provided shower supplies, change of scrubs and sanitary pads.  Pt in shower at this time.

## 2024-08-22 NOTE — ED Notes (Signed)
 Jillian Shepherd from [place] [phone] called accepted to [facility] can arrive at 08/23/23, Accepting provider Dr. Millie Smoke. Report can be called to [phone] option 2

## 2024-08-22 NOTE — ED Provider Notes (Signed)
 Emergency Medicine Observation Re-evaluation Note  Jillian Shepherd is a 39 y.o. female, seen on rounds today.  Pt initially presented to the ED for complaints of Miscarriage and Suicidal Currently, the patient is resting.  Physical Exam  BP 115/72 (BP Location: Right Arm)   Pulse 83   Temp 98 F (36.7 C) (Oral)   Resp 14   Ht 5' 4 (1.626 m)   Wt 68 kg   LMP 05/25/2024 (Approximate)   SpO2 98%   BMI 25.75 kg/m   General: No distress   ED Course / MDM  EKG:   I have reviewed the labs performed to date as well as medications administered while in observation.  Recent changes in the last 24 hours include placement today.  Plan  Current plan is for placement.    Claudene Rover, MD 08/22/24 [phone]

## 2024-08-22 NOTE — ED Notes (Signed)
Patient given snack.  

## 2024-08-23 ENCOUNTER — Other Ambulatory Visit: Payer: Self-pay

## 2024-08-23 ENCOUNTER — Inpatient Hospital Stay
Admission: AD | Admit: 2024-08-23 | Discharge: 2024-08-26 | DRG: 897 | Disposition: A | Discharge status: Home / Self Care | Payer: 59 | Attending: Psychiatry | Admitting: Psychiatry

## 2024-08-23 DIAGNOSIS — Z79899 Other long term (current) drug therapy: Secondary | ICD-10-CM | POA: Diagnosis not present

## 2024-08-23 DIAGNOSIS — Z833 Family history of diabetes mellitus: Secondary | ICD-10-CM | POA: Diagnosis not present

## 2024-08-23 DIAGNOSIS — F1094 Alcohol use, unspecified with alcohol-induced mood disorder: Secondary | ICD-10-CM | POA: Diagnosis not present

## 2024-08-23 DIAGNOSIS — Z794 Long term (current) use of insulin: Secondary | ICD-10-CM | POA: Diagnosis not present

## 2024-08-23 DIAGNOSIS — Z7984 Long term (current) use of oral hypoglycemic drugs: Secondary | ICD-10-CM

## 2024-08-23 DIAGNOSIS — R45851 Suicidal ideations: Secondary | ICD-10-CM | POA: Diagnosis present

## 2024-08-23 DIAGNOSIS — F4321 Adjustment disorder with depressed mood: Secondary | ICD-10-CM | POA: Diagnosis present

## 2024-08-23 DIAGNOSIS — Z818 Family history of other mental and behavioral disorders: Secondary | ICD-10-CM | POA: Diagnosis not present

## 2024-08-23 DIAGNOSIS — F1014 Alcohol abuse with alcohol-induced mood disorder: Secondary | ICD-10-CM | POA: Diagnosis present

## 2024-08-23 DIAGNOSIS — Z634 Disappearance and death of family member: Secondary | ICD-10-CM | POA: Diagnosis not present

## 2024-08-23 DIAGNOSIS — F17210 Nicotine dependence, cigarettes, uncomplicated: Secondary | ICD-10-CM | POA: Diagnosis present

## 2024-08-23 DIAGNOSIS — E119 Type 2 diabetes mellitus without complications: Secondary | ICD-10-CM | POA: Diagnosis present

## 2024-08-23 DIAGNOSIS — Z8759 Personal history of other complications of pregnancy, childbirth and the puerperium: Secondary | ICD-10-CM | POA: Diagnosis not present

## 2024-08-23 DIAGNOSIS — Z85828 Personal history of other malignant neoplasm of skin: Secondary | ICD-10-CM | POA: Diagnosis not present

## 2024-08-23 DIAGNOSIS — F419 Anxiety disorder, unspecified: Secondary | ICD-10-CM | POA: Diagnosis present

## 2024-08-23 DIAGNOSIS — Z8249 Family history of ischemic heart disease and other diseases of the circulatory system: Secondary | ICD-10-CM | POA: Diagnosis not present

## 2024-08-23 DIAGNOSIS — E538 Deficiency of other specified B group vitamins: Secondary | ICD-10-CM | POA: Diagnosis present

## 2024-08-23 DIAGNOSIS — Z808 Family history of malignant neoplasm of other organs or systems: Secondary | ICD-10-CM

## 2024-08-23 DIAGNOSIS — Z604 Social exclusion and rejection: Secondary | ICD-10-CM | POA: Diagnosis present

## 2024-08-23 LAB — CBG MONITORING, ED
Glucose-Capillary: 216 mg/dL — ABNORMAL HIGH (ref 70–99)
Glucose-Capillary: 301 mg/dL — ABNORMAL HIGH (ref 70–99)

## 2024-08-23 LAB — GLUCOSE, CAPILLARY
Glucose-Capillary: 291 mg/dL — ABNORMAL HIGH (ref 70–99)
Glucose-Capillary: 245 mg/dL — ABNORMAL HIGH (ref 70–99)

## 2024-08-23 MED ORDER — MELATONIN 5 MG PO TABS
5.0000 mg | ORAL_TABLET | Freq: Every day | ORAL | Status: DC
  Administered 2024-08-23 – 2024-08-25 (×3): 5 mg via ORAL
  Filled 2024-08-23 (×3): qty 1

## 2024-08-23 MED ORDER — ACETAMINOPHEN 325 MG PO TABS
650.0000 mg | ORAL_TABLET | Freq: Four times a day (QID) | ORAL | Status: DC | PRN
  Administered 2024-08-23 – 2024-08-25 (×4): 650 mg via ORAL
  Filled 2024-08-23 (×4): qty 2

## 2024-08-23 MED ORDER — INSULIN ASPART 100 UNIT/ML IJ SOLN
0.0000 [IU] | Freq: Every day | INTRAMUSCULAR | Status: DC
  Administered 2024-08-23: 3 [IU] via SUBCUTANEOUS
  Filled 2024-08-23: qty 3

## 2024-08-23 MED ORDER — INSULIN GLARGINE 100 UNIT/ML ~~LOC~~ SOLN
14.0000 [IU] | Freq: Every day | SUBCUTANEOUS | Status: DC
  Administered 2024-08-23: 14 [IU] via SUBCUTANEOUS
  Filled 2024-08-23 (×2): qty 0.14

## 2024-08-23 MED ORDER — INSULIN ASPART 100 UNIT/ML IJ SOLN
0.0000 [IU] | Freq: Three times a day (TID) | INTRAMUSCULAR | Status: DC
  Administered 2024-08-23 – 2024-08-24 (×2): 5 [IU] via SUBCUTANEOUS
  Administered 2024-08-24: 3 [IU] via SUBCUTANEOUS
  Administered 2024-08-24: 9 [IU] via SUBCUTANEOUS
  Administered 2024-08-25: 3 [IU] via SUBCUTANEOUS
  Administered 2024-08-25: 2 [IU] via SUBCUTANEOUS
  Administered 2024-08-25 – 2024-08-26 (×4): 3 [IU] via SUBCUTANEOUS
  Filled 2024-08-23 (×2): qty 5
  Filled 2024-08-23 (×2): qty 3
  Filled 2024-08-23: qty 4
  Filled 2024-08-23 (×2): qty 3

## 2024-08-23 MED ORDER — LORAZEPAM 2 MG PO TABS
0.0000 mg | ORAL_TABLET | Freq: Two times a day (BID) | ORAL | Status: DC
  Administered 2024-08-24: 1 mg via ORAL
  Filled 2024-08-23 (×2): qty 1

## 2024-08-23 MED ORDER — THIAMINE MONONITRATE 100 MG PO TABS
100.0000 mg | ORAL_TABLET | Freq: Every day | ORAL | Status: DC
  Administered 2024-08-24 – 2024-08-26 (×3): 100 mg via ORAL
  Filled 2024-08-23 (×3): qty 1

## 2024-08-23 MED ORDER — ADULT MULTIVITAMIN W/MINERALS CH
1.0000 | ORAL_TABLET | Freq: Every day | ORAL | Status: DC
  Administered 2024-08-24 – 2024-08-26 (×3): 1 via ORAL
  Filled 2024-08-23 (×3): qty 1

## 2024-08-23 MED ORDER — NICOTINE 14 MG/24HR TD PT24
14.0000 mg | MEDICATED_PATCH | Freq: Every day | TRANSDERMAL | Status: DC
  Administered 2024-08-24 – 2024-08-26 (×3): 14 mg via TRANSDERMAL
  Filled 2024-08-23 (×3): qty 1

## 2024-08-23 MED ORDER — NICOTINE POLACRILEX 2 MG MT GUM: 2.0000 mg | CHEWING_GUM | OROMUCOSAL | Status: DC | PRN

## 2024-08-23 MED ORDER — OLANZAPINE 10 MG IM SOLR: 5.0000 mg | Freq: Three times a day (TID) | INTRAMUSCULAR | Status: DC | PRN

## 2024-08-23 MED ORDER — ALUM & MAG HYDROXIDE-SIMETH 200-200-20 MG/5ML PO SUSP: 30.0000 mL | ORAL | Status: DC | PRN

## 2024-08-23 MED ORDER — LORAZEPAM 2 MG PO TABS
0.0000 mg | ORAL_TABLET | Freq: Four times a day (QID) | ORAL | Status: AC
  Administered 2024-08-23 – 2024-08-24 (×2): 1 mg via ORAL
  Filled 2024-08-23 (×2): qty 1

## 2024-08-23 MED ORDER — MAGNESIUM HYDROXIDE 400 MG/5ML PO SUSP: 30.0000 mL | Freq: Every day | ORAL | Status: DC | PRN

## 2024-08-23 MED ORDER — OLANZAPINE 10 MG IM SOLR: 10.0000 mg | Freq: Three times a day (TID) | INTRAMUSCULAR | Status: DC | PRN

## 2024-08-23 MED ORDER — FOLIC ACID 1 MG PO TABS
1.0000 mg | ORAL_TABLET | Freq: Every day | ORAL | Status: DC
  Administered 2024-08-24 – 2024-08-26 (×3): 1 mg via ORAL
  Filled 2024-08-23 (×3): qty 1

## 2024-08-23 MED ORDER — OLANZAPINE 5 MG PO TBDP: 5.0000 mg | ORAL_TABLET | Freq: Three times a day (TID) | ORAL | Status: DC | PRN

## 2024-08-23 MED ORDER — THIAMINE HCL 100 MG/ML IJ SOLN
100.0000 mg | Freq: Every day | INTRAMUSCULAR | Status: DC
  Filled 2024-08-23: qty 2

## 2024-08-23 NOTE — Plan of Care (Signed)
   Problem: Safety: Goal: Ability to disclose and discuss suicidal ideas will improve Outcome: Progressing

## 2024-08-23 NOTE — Group Note (Signed)
 [facility] LCSW Group Therapy Note   Group Date: 08/23/2024 Start Time: 1400 End Time: 1500   Type of Therapy/Topic:  Group Therapy:  Emotion Regulation  Participation Level:  Did Not Attend   Mood: Not able to assess  Description of Group:    The purpose of this group is to assist patients in learning to regulate negative emotions and experience positive emotions. Patients will be guided to discuss ways in which they have been vulnerable to their negative emotions. These vulnerabilities will be juxtaposed with experiences of positive emotions or situations, and patients challenged to use positive emotions to combat negative ones. Special emphasis will be placed on coping with negative emotions in conflict situations, and patients will process healthy conflict resolution skills.  Therapeutic Goals: Patient will identify two positive emotions or experiences to reflect on in order to balance out negative emotions:  Patient will label two or more emotions that they find the most difficult to experience:  Patient will be able to demonstrate positive conflict resolution skills through discussion or role plays:   Summary of Patient Progress: Pt did not participate.   Therapeutic Modalities:   Cognitive Behavioral Therapy Feelings Identification Dialectical Behavioral Therapy   Jillian Shepherd, LCSWA

## 2024-08-23 NOTE — ED Notes (Signed)
Report to Janet, RN.

## 2024-08-23 NOTE — ED Notes (Signed)
 Breakfast tray provided.

## 2024-08-23 NOTE — ED Provider Notes (Signed)
 Emergency Medicine Observation Re-evaluation Note  Jillian Shepherd is a 39 y.o. female, seen on rounds today.  Pt initially presented to the ED for complaints of Miscarriage and Suicidal  Currently, the patient is resting comfortably.  Physical Exam  BP (!) 148/99 (BP Location: Left Arm)   Pulse 77   Temp 98.8 F (37.1 C) (Oral)   Resp 17   Ht 5' 4 (1.626 m)   Wt 68 kg   LMP 05/25/2024 (Approximate)   SpO2 97%   BMI 25.75 kg/m  General: No acute distress Cardiac: Well-perfused extremities Lungs: No respiratory distress Psych: Appropriate mood and affect  ED Course / MDM  EKG:   I have reviewed the labs performed to date as well as medications administered while in observation.  Recent changes in the last 24 hours include none.  Plan  Current plan is for placement.   Jillian Artist POUR, MD 08/23/24 [phone]

## 2024-08-23 NOTE — Tx Team (Signed)
 Initial Treatment Plan 08/23/2024 4:59 PM [name] FMW:[id number]    PATIENT STRESSORS: Health problems   Loss of baby through miscarriage   Traumatic event     PATIENT STRENGTHS: Average or above average intelligence  Communication skills  General fund of knowledge    PATIENT IDENTIFIED PROBLEMS: Depression  Miscarriage                   DISCHARGE CRITERIA:  Improved stabilization in mood, thinking, and/or behavior  PRELIMINARY DISCHARGE PLAN: Outpatient therapy  PATIENT/FAMILY INVOLVEMENT: This treatment plan has been presented to and reviewed with the patient, Jillian Shepherd, and/or family member, .  The patient and family have been given the opportunity to ask questions and make suggestions.  Joshua Hoose, RN 08/23/2024, 4:59 PM

## 2024-08-23 NOTE — Progress Notes (Signed)
 Patient presents to unit, alert and orient. Denies any SI, HI, AVH. Patient reports is currently miscarrying a baby. States this is where her sadness is coming from. Reports on disability and was excited that she would have something to occupy her time. Reports she became upset when attending doctors appt, and was told that she was in the process of losing the baby.  Patient is sad, is interested in starting some anti-depressant medication, but feels as though this is not an appropriate place for her. Would like to start meds and discharge sooner so that she can get back to her 9 year old child at home. Oriented patient to room and unit. Skin and contraband search completed and witnessed by Onella, no contraband found. Patient has bruise to middle chest, states this is where they started compressions on her. Encouragement and support provided. Safety checks maintained. Medications given as prescribed. Patient remains safe on unit with q 15 min checks.

## 2024-08-23 NOTE — ED Notes (Signed)
IVC/pending transport to [facility]

## 2024-08-23 NOTE — Group Note (Signed)
 Date:  08/23/2024 Time:  9:40 PM  Group Topic/Focus:  Wrap-Up Group:   The focus of this group is to help patients review their daily goal of treatment and discuss progress on daily workbooks.    Participation Level:  Active  Participation Quality:  Appropriate, Attentive, Sharing, and Supportive  Affect:  Appropriate  Cognitive:  Appropriate  Insight: Appropriate and Good  Engagement in Group:  Engaged and Supportive  Modes of Intervention:  Discussion  Additional Comments:     Kerri Katz 08/23/2024, 9:40 PM

## 2024-08-24 LAB — GLUCOSE, CAPILLARY
Glucose-Capillary: 249 mg/dL — ABNORMAL HIGH (ref 70–99)
Glucose-Capillary: 259 mg/dL — ABNORMAL HIGH (ref 70–99)
Glucose-Capillary: 357 mg/dL — ABNORMAL HIGH (ref 70–99)
Glucose-Capillary: 123 mg/dL — ABNORMAL HIGH (ref 70–99)

## 2024-08-24 MED ORDER — LORAZEPAM 1 MG PO TABS
1.0000 mg | ORAL_TABLET | Freq: Once | ORAL | Status: AC
  Administered 2024-08-24: 1 mg via ORAL
  Filled 2024-08-24: qty 1

## 2024-08-24 MED ORDER — INSULIN ASPART 100 UNIT/ML IJ SOLN
4.0000 [IU] | Freq: Three times a day (TID) | INTRAMUSCULAR | Status: DC
  Administered 2024-08-24 – 2024-08-26 (×7): 4 [IU] via SUBCUTANEOUS
  Filled 2024-08-24 (×6): qty 4

## 2024-08-24 MED ORDER — SERTRALINE HCL 25 MG PO TABS
25.0000 mg | ORAL_TABLET | Freq: Every day | ORAL | Status: DC
  Administered 2024-08-24 – 2024-08-25 (×2): 25 mg via ORAL
  Filled 2024-08-24 (×2): qty 1

## 2024-08-24 MED ORDER — INSULIN GLARGINE 100 UNIT/ML ~~LOC~~ SOLN
18.0000 [IU] | Freq: Every day | SUBCUTANEOUS | Status: DC
  Administered 2024-08-24 – 2024-08-25 (×2): 18 [IU] via SUBCUTANEOUS
  Filled 2024-08-24 (×3): qty 0.18

## 2024-08-24 NOTE — Plan of Care (Signed)
  Problem: Education: Goal: Knowledge of [facility] Education information/materials will improve Outcome: Progressing Goal: Emotional status will improve Outcome: Progressing Goal: Mental status will improve Outcome: Progressing Goal: Verbalization of understanding the information provided will improve Outcome: Progressing   Problem: Activity: Goal: Interest or engagement in activities will improve Outcome: Progressing Goal: Sleeping patterns will improve Outcome: Progressing   Problem: Coping: Goal: Ability to verbalize frustrations and anger appropriately will improve Outcome: Progressing Goal: Ability to demonstrate self-control will improve Outcome: Progressing   Problem: Health Behavior/Discharge Planning: Goal: Identification of resources available to assist in meeting health care needs will improve Outcome: Progressing Goal: Compliance with treatment plan for underlying cause of condition will improve Outcome: Progressing   Problem: Physical Regulation: Goal: Ability to maintain clinical measurements within normal limits will improve Outcome: Progressing   Problem: Safety: Goal: Periods of time without injury will increase Outcome: Progressing   Problem: Education: Goal: Knowledge of [facility] Education information/materials will improve Outcome: Progressing Goal: Emotional status will improve Outcome: Progressing Goal: Mental status will improve Outcome: Progressing Goal: Verbalization of understanding the information provided will improve Outcome: Progressing   Problem: Activity: Goal: Interest or engagement in activities will improve Outcome: Progressing Goal: Sleeping patterns will improve Outcome: Progressing   Problem: Coping: Goal: Ability to verbalize frustrations and anger appropriately will improve Outcome: Progressing Goal: Ability to demonstrate self-control will improve Outcome: Progressing   Problem: Health  Behavior/Discharge Planning: Goal: Identification of resources available to assist in meeting health care needs will improve Outcome: Progressing Goal: Compliance with treatment plan for underlying cause of condition will improve Outcome: Progressing   Problem: Physical Regulation: Goal: Ability to maintain clinical measurements within normal limits will improve Outcome: Progressing   Problem: Safety: Goal: Periods of time without injury will increase Outcome: Progressing   Problem: Education: Goal: Utilization of techniques to improve thought processes will improve Outcome: Progressing Goal: Knowledge of the prescribed therapeutic regimen will improve Outcome: Progressing   Problem: Activity: Goal: Interest or engagement in leisure activities will improve Outcome: Progressing Goal: Imbalance in normal sleep/wake cycle will improve Outcome: Progressing   Problem: Coping: Goal: Coping ability will improve Outcome: Progressing Goal: Will verbalize feelings Outcome: Progressing   Problem: Health Behavior/Discharge Planning: Goal: Ability to make decisions will improve Outcome: Progressing Goal: Compliance with therapeutic regimen will improve Outcome: Progressing   Problem: Role Relationship: Goal: Will demonstrate positive changes in social behaviors and relationships Outcome: Progressing   Problem: Safety: Goal: Ability to disclose and discuss suicidal ideas will improve Outcome: Progressing Goal: Ability to identify and utilize support systems that promote safety will improve Outcome: Progressing   Problem: Self-Concept: Goal: Will verbalize positive feelings about self Outcome: Progressing Goal: Level of anxiety will decrease Outcome: Progressing

## 2024-08-24 NOTE — H&P (Signed)
 " Psychiatric Admission Assessment Adult  Patient Identification: Jillian Shepherd MRN:  [id number] Date of Evaluation:  08/24/2024 Chief Complaint:  Alcohol-induced mood disorder (HCC) [F10.94]   History of Present Illness: Patient admitted following reported suicidal statements made to her husband in the context of miscarriage. Patient reports that when she initially learned she was pregnant, it was a miracle given her history of cancer and  loss of her mother. She was very happy about the pregnancy and ceased smoking and drinking. At [redacted] weeks gestation, she began spotting and presented to her OB/GYN, where hCG levels were elevated. The following week, she experienced heavier bleeding, repeat hCG levels were declining, and she was informed she was likely miscarrying. This past Friday, she began passing clots, including one large clot approximately the size of a softball. She was home alone at the time (children at school, husband at work) and drank 2 beers due to being upset. When her children arrived home, she was tearful and informed them of the loss. Her husband entered the room and became argumentative rather than comforting, stating maybe if you would relax and sit down and not be so busy this would have worked out differently. Patient perceived these statements as blame for the miscarriage. She drank an additional beer following this interaction. Patient denies making or remembering any suicidal statements.  Patient currently denies suicidal ideations and reports her goal is to return home to her children. She endorses all of her children as protective factors that give her desire to live. She denies current homicidal ideations, auditory hallucinations, or visual hallucinations. She endorses anxiety primarily related to being hospitalized and separation from her children. She reports ongoing cramping and passing blood, which medical providers have informed her is normal post-miscarriage. She  experienced dizziness this morning which has since resolved. Sleep has been poor with frequent tossing and turning, which she attributes to anxiety.   Patient believes her anxiety and depression are primarily situational due to recent major losses (miscarriage, mother's death).   Patient tearful when discussing recent events but calm and cooperative with provider throughout examination.  Did the patient present with any abnormal findings indicating the need for additional neurological or psychological testing?  No  Total Time spent with patient: 1 hour Sleep  Sleep:Sleep: Poor  Past Psychiatric History:  Psychiatric History:  Information collected from patient/chart review  Prev Dx/Sx: Denied but did endorse history of alcohol use Current Psych Provider: None currently Home Meds (current): Denied Previous Med Trials: Denied Therapy: Denied  Prior Psych Hospitalization: Denied Prior Self Harm: Denied Prior Violence: Denied  Family Psych History: Denied Family Hx suicide: Denied  Social History:  Legal Hx: Denied Living Situation: With husband and 2 children, reported her 3 other children live outside of the home but has 5 children total Spiritual Hx: Endorsed Access to weapons/lethal means: Denied  Substance History Alcohol: Endorsed Type of alcohol Beer Last Drink prior to ED arrival Number of drinks per day reported does not drink daily History of alcohol withdrawal seizures Denied History of DT's Denied Tobacco: Endorsed Illicit drugs: Denied Prescription drug abuse: Denied Rehab hx: Denied Is the patient at risk to self? Yes.   Reported suicidal ideations in context of multiple significant stressors, to husband- need further collateral information  Has the patient been a risk to self in the past 6 months? No.  Has the patient been a risk to self within the distant past? No.  Is the patient a risk to others? No.  Has  the patient been a risk to others in the past 6  months? No.  Has the patient been a risk to others within the distant past? No.   Columbia Scale:  Flowsheet Row Admission (Current) from 08/23/2024 in [facility] INPATIENT BE[facility]AVIORAL MEDICINE ED from 08/20/2024 in [facility] Emergency Department [facility] ED to Hosp-Admission (Discharged) from 04/28/2024 in [facility] MEDICAL SURGICAL UNIT  C-SSRS RISK CATEGORY No Risk Low Risk No Risk     Past Medical History:  Past Medical History:  Diagnosis Date   Abdominal pain    Alcohol abuse    B12 deficiency    Cancer (HCC)    squmaous cell skin cancer    Collagen vascular disease    Diabetes mellitus without complication (HCC)    Hypertension    Hypokalemia    Liddle's syndrome    Obesity (BMI 35.0-39.9 without comorbidity)    Pancreatitis    Pleural effusion    Splenic hemorrhage     Past Surgical History:  Procedure Laterality Date   ADENOIDECTOMY     CHEST TUBE INSERTION     COLONOSCOPY WITH PROPOFOL  N/A 10/06/2020   Procedure: COLONOSCOPY WITH PROPOFOL ;  Surgeon: Unk Corinn Skiff, MD;  Location: [facility] ENDOSCOPY;  Service: Gastroenterology;  Laterality: N/A;  COVID POSITIVE 08/23/2020   ESOPHAGOGASTRODUODENOSCOPY (EGD) WITH PROPOFOL  N/A 10/06/2020   Procedure: ESOPHAGOGASTRODUODENOSCOPY (EGD) WITH PROPOFOL ;  Surgeon: Unk Corinn Skiff, MD;  Location: [facility] ENDOSCOPY;  Service: Gastroenterology;  Laterality: N/A;   skin cancer removal Left    leg   TONSILLECTOMY     Family History:  Family History  Problem Relation Age of Onset   Hypertension Father    Anxiety disorder Father    Depression Father    Melanoma Paternal Grandmother        of skin   Diabetes Paternal Grandmother    Cirrhosis Paternal Uncle     Social History:  Social History   Substance and Sexual Activity  Alcohol Use Yes   Alcohol/week: 1.0 standard drink of alcohol   Types: 1 Cans of beer per week     Social History   Substance and Sexual Activity  Drug Use No      Allergies:   Allergies[1] Lab Results:  Results for orders placed or performed during the hospital encounter of 08/23/24 (from the past 48 hours)  Glucose, capillary     Status: Abnormal   Collection Time: 08/23/24  4:42 PM  Result Value Ref Range   Glucose-Capillary 291 (H) 70 - 99 mg/dL    Comment: Glucose reference range applies only to samples taken after fasting for at least 8 hours.  Glucose, capillary     Status: Abnormal   Collection Time: 08/23/24  8:39 PM  Result Value Ref Range   Glucose-Capillary 245 (H) 70 - 99 mg/dL    Comment: Glucose reference range applies only to samples taken after fasting for at least 8 hours.  Glucose, capillary     Status: Abnormal   Collection Time: 08/24/24  6:41 AM  Result Value Ref Range   Glucose-Capillary 249 (H) 70 - 99 mg/dL    Comment: Glucose reference range applies only to samples taken after fasting for at least 8 hours.  Glucose, capillary     Status: Abnormal   Collection Time: 08/24/24 11:46 AM  Result Value Ref Range   Glucose-Capillary 259 (H) 70 - 99 mg/dL    Comment: Glucose reference range applies only to samples taken after fasting for at least  8 hours.    Blood Alcohol level:  Lab Results  Component Value Date   ETH 263 (H) 08/20/2024   ETH <15 02/01/2024    Metabolic Disorder Labs:  Lab Results  Component Value Date   HGBA1C 11.1 (H) 04/29/2024   MPG 271.87 04/29/2024   MPG 312 01/16/2024   No results found for: PROLACTIN Lab Results  Component Value Date   CHOL 166 02/01/2024   TRIG 140 02/01/2024   HDL 39 (L) 02/01/2024   CHOLHDL 4.3 02/01/2024   VLDL 28 02/01/2024   LDLCALC 99 02/01/2024   LDLCALC 116 (H) 08/15/2023    Current Medications: Current Facility-Administered Medications  Medication Dose Route Frequency Provider Last Rate Last Admin   acetaminophen  (TYLENOL ) tablet 650 mg  650 mg Oral Q6H PRN Jonmichael Beadnell B, NP   650 mg at 08/24/24 0833   alum & mag hydroxide-simeth (MAALOX/MYLANTA) 200-200-20  MG/5ML suspension 30 mL  30 mL Oral Q4H PRN Melbert Botelho B, NP       folic acid  (FOLVITE ) tablet 1 mg  1 mg Oral Daily Justyn Boyson B, NP   1 mg at 08/24/24 9171   insulin  aspart (novoLOG ) injection 0-5 Units  0-5 Units Subcutaneous QHS Arel Tippen B, NP   3 Units at 08/23/24 2111   insulin  aspart (novoLOG ) injection 0-9 Units  0-9 Units Subcutaneous TID WC Kiwana Deblasi B, NP   5 Units at 08/24/24 1215   insulin  glargine (LANTUS ) injection 14 Units  14 Units Subcutaneous QHS Emileigh Kellett B, NP   14 Units at 08/23/24 2108   LORazepam  (ATIVAN ) tablet 0-4 mg  0-4 mg Oral Q6H Darrow Barreiro B, NP   1 mg at 08/24/24 9167   Followed by   LORazepam  (ATIVAN ) tablet 0-4 mg  0-4 mg Oral Q12H Linkon Siverson B, NP       magnesium  hydroxide (MILK OF MAGNESIA) suspension 30 mL  30 mL Oral Daily PRN Eduin Friedel B, NP       melatonin tablet 5 mg  5 mg Oral QHS Doneta Bayman B, NP   5 mg at 08/23/24 2108   multivitamin with minerals tablet 1 tablet  1 tablet Oral Daily Roshad Hack B, NP   1 tablet at 08/24/24 9171   nicotine  (NICODERM CQ  - dosed in mg/24 hours) patch 14 mg  14 mg Transdermal Daily Jadapalle, Sree, MD   14 mg at 08/24/24 9170   nicotine  polacrilex (NICORETTE ) gum 2 mg  2 mg Oral PRN Madaram, Kondal R, MD       OLANZapine  (ZYPREXA ) injection 10 mg  10 mg Intramuscular TID PRN Carlisia Geno B, NP       OLANZapine  (ZYPREXA ) injection 5 mg  5 mg Intramuscular TID PRN Erron Wengert B, NP       OLANZapine  zydis (ZYPREXA ) disintegrating tablet 5 mg  5 mg Oral TID PRN Krissia Schreier B, NP       sertraline  (ZOLOFT ) tablet 25 mg  25 mg Oral Daily Ovid Witman B, NP   25 mg at 08/24/24 1121   thiamine  (VITAMIN B1) tablet 100 mg  100 mg Oral Daily Jonavin Seder B, NP   100 mg at 08/24/24 9171   Or   thiamine  (VITAMIN B1) injection 100 mg  100 mg Intravenous Daily Marthella Osorno B, NP       PTA Medications: Medications Prior to Admission  Medication Sig Dispense Refill Last Dose/Taking   ACCU-CHEK GUIDE TEST test  strip USE IN THE MORNING,  AT NOON, AND AT BEDTIME. MAY SUBSTITUTE TO ANY MANUFACTURER 100 strip 0    Accu-Chek Softclix Lancets lancets USE IN THE MORNING, AT NOON, AND AT BEDTIME. MAY SUBSTITUTE 100 each 3    acetaminophen  (TYLENOL ) 500 MG tablet Take 1,000 mg by mouth every 6 (six) hours as needed for mild pain (pain score 1-3).      BD PEN NEEDLE SHORT ULTRAFINE 31G X 8 MM MISC USE TO ADMINISTER INSULIN  DAILY. 100 each 1    Blood Glucose Monitoring Suppl DEVI 1 each by Does not apply route in the morning, at noon, and at bedtime. May substitute to any manufacturer covered by patient's insurance. 1 each 0    Blood Glucose Monitoring Suppl DEVI 1 each by Does not apply route in the morning, at noon, and at bedtime. May substitute to any manufacturer covered by patient's insurance. 1 each 0    calcium  carbonate (TUMS - DOSED IN MG ELEMENTAL CALCIUM ) 500 MG chewable tablet Chew 2 tablets by mouth daily.      Continuous Glucose Sensor (FREESTYLE LIBRE 3 PLUS SENSOR) MISC Change sensor every 15 days. (Patient not taking: Reported on 08/04/2024) 2 each 5    folic acid  (FOLVITE ) 1 MG tablet Take 1 tablet (1 mg total) by mouth daily. (Patient not taking: Reported on 08/04/2024) 30 tablet 1    insulin  glargine (LANTUS  SOLOSTAR) 100 UNIT/ML Solostar Pen Inject 20 Units into the skin daily. (Patient taking differently: Inject 10-20 Units into the skin 3 (three) times daily as needed (high blood sugar). Use at least once daily) 15 mL 5    labetalol  (NORMODYNE ) 100 MG tablet Take 1 tablet (100 mg total) by mouth 2 (two) times daily. 60 tablet 1    lipase/protease/amylase (CREON ) 36000 UNITS CPEP capsule Take 2 capsules (72,000 Units total) by mouth 3 (three) times daily with meals. (Patient not taking: Reported on 08/21/2024) 300 capsule 2    lisinopril  (ZESTRIL ) 40 MG tablet Take 1 tablet (40 mg total) by mouth daily. (Patient not taking: No sig reported) 30 tablet 1    Multiple Vitamin (MULTIVITAMIN WITH  MINERALS) TABS tablet Take 1 tablet by mouth daily.      oxyCODONE  (ROXICODONE ) 5 MG immediate release tablet Take 1 tablet (5 mg total) by mouth every 6 (six) hours as needed for severe pain (pain score 7-10), moderate pain (pain score 4-6) or breakthrough pain. 20 tablet 0    pioglitazone  (ACTOS ) 30 MG tablet Take 1 tablet (30 mg total) by mouth daily. (Patient not taking: Reported on 08/04/2024) 30 tablet 3     Psychiatric Specialty Exam:  Presentation  General Appearance: Appropriate for Environment  Eye Contact:Good  Speech:Clear and Coherent  Speech Volume:Normal    Mood and Affect  Mood:Depressed; Anxious  Affect:Appropriate; Congruent; Tearful   Thought Process  Thought Processes:Coherent; Goal Directed; Linear  Descriptions of Associations:No data recorded Orientation:Full (Time, Place and Person)  Thought Content:Logical  Hallucinations:Hallucinations: None  Ideas of Reference:None  Suicidal Thoughts:Suicidal Thoughts: No  Homicidal Thoughts:Homicidal Thoughts: No   Sensorium  Memory:Immediate Good; Recent Fair; Remote Good  Judgment:Fair  Insight:Fair   Executive Functions  Concentration:Fair  Attention Span:Fair  Recall:Fair  Fund of Knowledge:Fair  Language:Fair   Psychomotor Activity  Psychomotor Activity:Psychomotor Activity: Normal   Assets  Assets:Communication Skills; Desire for Improvement; Housing; Social Support    Musculoskeletal: Strength & Muscle Tone: within normal limits Gait & Station: normal  Physical Exam: Physical Exam Pulmonary:     Effort: Pulmonary effort is  normal.  Neurological:     Mental Status: She is alert and oriented to person, place, and time.    Review of Systems  Respiratory:  Negative for shortness of breath.   Cardiovascular:  Negative for chest pain.  Gastrointestinal:  Positive for abdominal pain.  Neurological:  Negative for dizziness and headaches.  Psychiatric/Behavioral:   Positive for depression. Negative for hallucinations and suicidal ideas. The patient is nervous/anxious and has insomnia.    Blood pressure (!) 126/91, pulse 86, temperature 97.6 F (36.4 C), temperature source Oral, resp. rate 18, height 5' 3 (1.6 m), weight 67.6 kg, SpO2 98%, unknown if currently breastfeeding. Body mass index is 26.39 kg/m.  Principal Diagnosis: Alcohol-induced mood disorder (HCC) Diagnosis:  Principal Problem:   Alcohol-induced mood disorder (HCC)   Clinical Decision Making:  Treatment Plan Summary:  Safety and Monitoring:             -- Voluntary admission to inpatient psychiatric unit for safety, stabilization and treatment             -- Daily contact with patient to assess and evaluate symptoms and progress in treatment             -- Patient's case to be discussed in multi-disciplinary team meeting             -- Observation Level: q15 minute checks             -- Vital signs:  q12 hours             -- Precautions: suicide, elopement, and assault   2. Psychiatric Diagnoses and Treatment:  -Initiated low-dose sertraline  after thorough discussion of risks, benefits, and side effects. Sertraline  was selected given patient's recent miscarriage, history of alcohol use, and history of pancreatitis, as it has favorable safety profile in these contexts with lower hepatotoxicity risk and minimal drug-drug interactions.                 -- The risks/benefits/side-effects/alternatives to this medication were discussed in detail with the patient and time was given for questions. The patient consents to medication trial.                -- Metabolic profile and EKG monitoring obtained while on an atypical antipsychotic (BMI: Lipid Panel: HbgA1c: QTc:)              -- Encouraged patient to participate in unit milieu and in scheduled group therapies                            3. Medical Issues Being Addressed:   - Continue to monitor patient's cramping/blood loss. Patient  educated to notify staff immediately of any increasing or concerning symptoms, including  dizziness, or obstetric concerns.  -NRT -Continue CIWA protocol   4. Discharge Planning:              -- Social work and case management to assist with discharge planning and identification of hospital follow-up needs prior to discharge             -- Estimated LOS: 5-7 days             -- Discharge Concerns: Need to establish a safety plan; Medication compliance and effectiveness             -- Discharge Goals: Return home with outpatient referrals follow ups  Physician Treatment Plan for Primary Diagnosis: Alcohol-induced mood disorder (  HCC) Long Term Goal(s): Improvement in symptoms so as ready for discharge  Short Term Goals: Ability to identify changes in lifestyle to reduce recurrence of condition will improve, Ability to verbalize feelings will improve, Ability to disclose and discuss suicidal ideas, Ability to identify and develop effective coping behaviors will improve, Ability to maintain clinical measurements within normal limits will improve, and Ability to identify triggers associated with substance abuse/mental health issues will improve    I certify that inpatient services furnished can reasonably be expected to improve the patient's condition.    Zelda Sharps, NP This note was created using Dragon dictation software. Please excuse any inadvertent transcription errors. Case was discussed with supervising physician Dr. Jadapalle who is agreeable with current plan.       [1] No Known Allergies  "

## 2024-08-24 NOTE — Group Note (Signed)
 [facility] LCSW Group Therapy Note   Group Date: 08/24/2024 Start Time: 1245 End Time: 1357   Type of Therapy/Topic:  Group Therapy:  Emotion Regulation  Participation Level:  Active   Mood: Appropriate   Description of Group:    The purpose of this group is to assist patients in learning to regulate negative emotions and experience positive emotions. Patients will be guided to discuss ways in which they have been vulnerable to their negative emotions. These vulnerabilities will be juxtaposed with experiences of positive emotions or situations, and patients challenged to use positive emotions to combat negative ones. Special emphasis will be placed on coping with negative emotions in conflict situations, and patients will process healthy conflict resolution skills.  Therapeutic Goals: Patient will identify two positive emotions or experiences to reflect on in order to balance out negative emotions:  Patient will label two or more emotions that they find the most difficult to experience:  Patient will be able to demonstrate positive conflict resolution skills through discussion or role plays:   Summary of Patient Progress: During group, patient and group explored the ways in which our thoughts can impact our feelings which impacts our behaviors. Group along with facilitator completed a thermometer activity where different areas of life were explored. Participants were asked to notate in which zone these areas exist in on their personal thermometers. The group then discussed coping skills, and safety plans to help better prepare for potential stressors and learn to better emotionally regulate.      Therapeutic Modalities:   Cognitive Behavioral Therapy Feelings Identification Dialectical Behavioral Therapy   Jillian CHRISTELLA Kerns, LCSW

## 2024-08-24 NOTE — Group Note (Signed)
 Recreation Therapy Group Note   Group Topic:Coping Skills  Group Date: 08/24/2024 Start Time: 1530 End Time: 1615 Facilitators: Celestia Jeoffrey FORBES ARTICE, CTRS Location: Craft Room  Group Description: Coping A-Z. LRT and patients engage in a guided discussion on what coping skills are and gave specific examples. LRT passed out a handout labeled Coping A-Z with blank spaces beside each letter. LRT prompted patients to come up with a coping skill for each of the letters. LRT and patients went over the handout and gave ideas for each letter if anyone had any blanks left on their paper. Patients kept this handout with them that listed 26 different coping skills.   Goal Area(s) Addressed: Patients will be able to define coping skills. Patient will identify new coping skills.  Patient will increase communication.   Affect/Mood: Appropriate   Participation Level: Active and Engaged   Participation Quality: Independent   Behavior: Calm and Cooperative   Speech/Thought Process: Coherent   Insight: Good   Judgement: Good   Modes of Intervention: Clarification, Education, Worksheet, and Writing   Patient Response to Interventions:  Attentive, Engaged, Interested , and Receptive   Education Outcome:  Acknowledges education   Clinical Observations/Individualized Feedback: Anglia was active in their participation of session activities and group discussion. Pt interacted well with LRT and peers duration of session.    Plan: Continue to engage patient in RT group sessions 2-3x/week.   Jeoffrey FORBES Celestia, LRT, CTRS 08/24/2024 5:33 PM

## 2024-08-24 NOTE — Inpatient Diabetes Management (Signed)
 Inpatient Diabetes Program Recommendations  AACE/ADA: New Consensus Statement on Inpatient Glycemic Control  Target Ranges:  Prepandial:   less than 140 mg/dL      Peak postprandial:   less than 180 mg/dL (1-2 hours)      Critically ill patients:  140 - 180 mg/dL    Latest Reference Range & Units 08/23/24 08:00 08/23/24 12:58 08/23/24 16:42 08/23/24 20:39 08/24/24 06:41  Glucose-Capillary 70 - 99 mg/dL 783 (H) 698 (H) 708 (H) 245 (H) 249 (H)   Review of Glycemic Control  Diabetes history: DM2 Outpatient Diabetes medications: Lantus  10-20 units daily, Actos  30 mg daily (not taking) Current orders for Inpatient glycemic control: Lantus  14 units at bedtime, Novolog  0-9 units TID with meals, Novolog  0-5 units QHS  Inpatient Diabetes Program Recommendations:    Insulin : Please consider increasing Lantus  to 18 units at bedtime and adding  Novolog  4 units TID with meals for meal coverage if patient eats at least 50% of meals.  Thanks, Earnie Gainer, RN, MSN, CDCES Diabetes Coordinator Inpatient Diabetes Program [phone] (Team Pager from 8am to 5pm)

## 2024-08-24 NOTE — Group Note (Signed)
 Date:  08/24/2024 Time:  11:00 AM  Group Topic/Focus:  Managing Feelings:   The focus of this group is to identify what feelings patients have difficulty handling and develop a plan to handle them in a healthier way upon discharge.    Participation Level:  Active  Participation Quality:  Appropriate  Affect:  Appropriate  Cognitive:  Appropriate  Insight: Appropriate  Engagement in Group:  Engaged  Modes of Intervention:  Activity  Additional Comments:    Jillian Shepherd 08/24/2024, 11:00 AM

## 2024-08-24 NOTE — Progress Notes (Signed)
" °   08/24/24 1200  Psych Admission Type (Psych Patients Only)  Admission Status Voluntary  Psychosocial Assessment  Patient Complaints Sadness  Eye Contact Fair  Facial Expression Sad  Affect Sad  Speech Logical/coherent  Interaction Assertive  Motor Activity Slow  Appearance/Hygiene Unremarkable  Behavior Characteristics Cooperative  Mood Depressed  Aggressive Behavior  Effect No apparent injury  Thought Process  Coherency WDL  Content WDL  Delusions None reported or observed  Perception WDL  Hallucination None reported or observed  Judgment WDL  Confusion None  Danger to Self  Current suicidal ideation? Denies  Danger to Others  Danger to Others None reported or observed    "

## 2024-08-24 NOTE — Group Note (Signed)
 Recreation Therapy Group Note   Group Topic:Healthy Support Systems  Group Date: 08/24/2024 Start Time: 1020 End Time: 1115 Facilitators: Celestia Jeoffrey FORBES ARTICE, CTRS Location: Craft Room  Group Description: Straw Bridge. In groups or individually, patients were given 10 plastic drinking straws and an equal length of masking tape. Using the materials provided, patients were instructed to build a free-standing bridge-like structure to suspend an everyday item (ex: deck of cards) off the floor or table surface. All materials were required to be used in secondary school teacher. LRT facilitated post-activity discussion reviewing the importance of having strong and healthy support systems in our lives. LRT discussed how the people in our lives serve as the tape and the deck of cards we placed on top of our straw structure are the stressors we face in daily life. LRT and pts discussed what happens in our life when things get too heavy for us , and we don't have strong supports outside of the hospital. Pt shared 2 of their healthy supports in their life aloud in the group.   Goal Area(s) Addressed:  Patient will identify 2 healthy supports in their life. Patient will identify skills to successfully complete activity. Patient will identify correlation of this activity to life post-discharge.  Patient will build on frustration tolerance skills. Patient will increase team building and communication skills.    Affect/Mood: Appropriate   Participation Level: Moderate    Clinical Observations/Individualized Feedback: Mansi came late to group after meeting with NP. Pt was present for less than half the session. Pt shared that her husband, daughter, and sister are healthy supports.   Plan: Continue to engage patient in RT group sessions 2-3x/week.   Jeoffrey FORBES Celestia, LRT, CTRS 08/24/2024 12:14 PM

## 2024-08-24 NOTE — BH IP Treatment Plan (Signed)
 Interdisciplinary Treatment and Diagnostic Plan Update  08/24/2024 Time of Session: 10:09 AM Jillian Shepherd MRN: [id number]  Principal Diagnosis: Alcohol-induced mood disorder (HCC)  Secondary Diagnoses: Principal Problem:   Alcohol-induced mood disorder (HCC)   Current Medications:  Current Facility-Administered Medications  Medication Dose Route Frequency Provider Last Rate Last Admin   acetaminophen  (TYLENOL ) tablet 650 mg  650 mg Oral Q6H PRN Smith, Annie B, NP   650 mg at 08/24/24 0833   alum & mag hydroxide-simeth (MAALOX/MYLANTA) 200-200-20 MG/5ML suspension 30 mL  30 mL Oral Q4H PRN Smith, Annie B, NP       folic acid  (FOLVITE ) tablet 1 mg  1 mg Oral Daily Smith, Annie B, NP   1 mg at 08/24/24 9171   insulin  aspart (novoLOG ) injection 0-5 Units  0-5 Units Subcutaneous QHS Smith, Annie B, NP   3 Units at 08/23/24 2111   insulin  aspart (novoLOG ) injection 0-9 Units  0-9 Units Subcutaneous TID WC Smith, Annie B, NP   5 Units at 08/24/24 1215   insulin  aspart (novoLOG ) injection 4 Units  4 Units Subcutaneous TID WC Smith, Annie B, NP       insulin  glargine (LANTUS ) injection 18 Units  18 Units Subcutaneous QHS Smith, Annie B, NP       LORazepam  (ATIVAN ) tablet 0-4 mg  0-4 mg Oral Q6H Smith, Annie B, NP   1 mg at 08/24/24 9167   Followed by   LORazepam  (ATIVAN ) tablet 0-4 mg  0-4 mg Oral Q12H Smith, Annie B, NP       magnesium  hydroxide (MILK OF MAGNESIA) suspension 30 mL  30 mL Oral Daily PRN Smith, Annie B, NP       melatonin tablet 5 mg  5 mg Oral QHS Smith, Annie B, NP   5 mg at 08/23/24 2108   multivitamin with minerals tablet 1 tablet  1 tablet Oral Daily Claudene Sham B, NP   1 tablet at 08/24/24 9171   nicotine  (NICODERM CQ  - dosed in mg/24 hours) patch 14 mg  14 mg Transdermal Daily Jadapalle, Sree, MD   14 mg at 08/24/24 9170   nicotine  polacrilex (NICORETTE ) gum 2 mg  2 mg Oral PRN Madaram, Kondal R, MD       OLANZapine  (ZYPREXA ) injection 10 mg  10 mg Intramuscular TID PRN  Smith, Annie B, NP       OLANZapine  (ZYPREXA ) injection 5 mg  5 mg Intramuscular TID PRN Smith, Annie B, NP       OLANZapine  zydis (ZYPREXA ) disintegrating tablet 5 mg  5 mg Oral TID PRN Smith, Annie B, NP       sertraline  (ZOLOFT ) tablet 25 mg  25 mg Oral Daily Smith, Annie B, NP   25 mg at 08/24/24 1121   thiamine  (VITAMIN B1) tablet 100 mg  100 mg Oral Daily Smith, Annie B, NP   100 mg at 08/24/24 9171   Or   thiamine  (VITAMIN B1) injection 100 mg  100 mg Intravenous Daily Smith, Annie B, NP       PTA Medications: Medications Prior to Admission  Medication Sig Dispense Refill Last Dose/Taking   ACCU-CHEK GUIDE TEST test strip USE IN THE MORNING, AT NOON, AND AT BEDTIME. MAY SUBSTITUTE TO ANY MANUFACTURER 100 strip 0    Accu-Chek Softclix Lancets lancets USE IN THE MORNING, AT NOON, AND AT BEDTIME. MAY SUBSTITUTE 100 each 3    acetaminophen  (TYLENOL ) 500 MG tablet Take 1,000 mg by mouth every 6 (six) hours as  needed for mild pain (pain score 1-3).      BD PEN NEEDLE SHORT ULTRAFINE 31G X 8 MM MISC USE TO ADMINISTER INSULIN  DAILY. 100 each 1    Blood Glucose Monitoring Suppl DEVI 1 each by Does not apply route in the morning, at noon, and at bedtime. May substitute to any manufacturer covered by patient's insurance. 1 each 0    Blood Glucose Monitoring Suppl DEVI 1 each by Does not apply route in the morning, at noon, and at bedtime. May substitute to any manufacturer covered by patient's insurance. 1 each 0    calcium  carbonate (TUMS - DOSED IN MG ELEMENTAL CALCIUM ) 500 MG chewable tablet Chew 2 tablets by mouth daily.      Continuous Glucose Sensor (FREESTYLE LIBRE 3 PLUS SENSOR) MISC Change sensor every 15 days. (Patient not taking: Reported on 08/04/2024) 2 each 5    folic acid  (FOLVITE ) 1 MG tablet Take 1 tablet (1 mg total) by mouth daily. (Patient not taking: Reported on 08/04/2024) 30 tablet 1    insulin  glargine (LANTUS  SOLOSTAR) 100 UNIT/ML Solostar Pen Inject 20 Units into the skin  daily. (Patient taking differently: Inject 10-20 Units into the skin 3 (three) times daily as needed (high blood sugar). Use at least once daily) 15 mL 5    labetalol  (NORMODYNE ) 100 MG tablet Take 1 tablet (100 mg total) by mouth 2 (two) times daily. 60 tablet 1    lipase/protease/amylase (CREON ) 36000 UNITS CPEP capsule Take 2 capsules (72,000 Units total) by mouth 3 (three) times daily with meals. (Patient not taking: Reported on 08/21/2024) 300 capsule 2    lisinopril  (ZESTRIL ) 40 MG tablet Take 1 tablet (40 mg total) by mouth daily. (Patient not taking: No sig reported) 30 tablet 1    Multiple Vitamin (MULTIVITAMIN WITH MINERALS) TABS tablet Take 1 tablet by mouth daily.      oxyCODONE  (ROXICODONE ) 5 MG immediate release tablet Take 1 tablet (5 mg total) by mouth every 6 (six) hours as needed for severe pain (pain score 7-10), moderate pain (pain score 4-6) or breakthrough pain. 20 tablet 0    pioglitazone  (ACTOS ) 30 MG tablet Take 1 tablet (30 mg total) by mouth daily. (Patient not taking: Reported on 08/04/2024) 30 tablet 3     Patient Stressors: Health problems   Loss of baby through miscarriage   Traumatic event    Patient Strengths: Average or above average intelligence  Communication skills  General fund of knowledge   Treatment Modalities: Medication Management, Group therapy, Case management,  1 to 1 session with clinician, Psychoeducation, Recreational therapy.   Physician Treatment Plan for Primary Diagnosis: Alcohol-induced mood disorder (HCC) Long Term Goal(s): Improvement in symptoms so as ready for discharge   Short Term Goals: Ability to identify changes in lifestyle to reduce recurrence of condition will improve Ability to verbalize feelings will improve Ability to disclose and discuss suicidal ideas Ability to identify and develop effective coping behaviors will improve Ability to maintain clinical measurements within normal limits will improve Ability to identify  triggers associated with substance abuse/mental health issues will improve  Medication Management: Evaluate patient's response, side effects, and tolerance of medication regimen.  Therapeutic Interventions: 1 to 1 sessions, Unit Group sessions and Medication administration.  Evaluation of Outcomes: Not Met  Physician Treatment Plan for Secondary Diagnosis: Principal Problem:   Alcohol-induced mood disorder (HCC)  Long Term Goal(s): Improvement in symptoms so as ready for discharge   Short Term Goals: Ability to identify changes  in lifestyle to reduce recurrence of condition will improve Ability to verbalize feelings will improve Ability to disclose and discuss suicidal ideas Ability to identify and develop effective coping behaviors will improve Ability to maintain clinical measurements within normal limits will improve Ability to identify triggers associated with substance abuse/mental health issues will improve     Medication Management: Evaluate patient's response, side effects, and tolerance of medication regimen.  Therapeutic Interventions: 1 to 1 sessions, Unit Group sessions and Medication administration.  Evaluation of Outcomes: Not Met   RN Treatment Plan for Primary Diagnosis: Alcohol-induced mood disorder (HCC) Long Term Goal(s): Knowledge of disease and therapeutic regimen to maintain health will improve  Short Term Goals: Ability to verbalize frustration and anger appropriately will improve, Ability to demonstrate self-control, Ability to participate in decision making will improve, Ability to verbalize feelings will improve, Ability to disclose and discuss suicidal ideas, and Ability to identify and develop effective coping behaviors will improve  Medication Management: RN will administer medications as ordered by provider, will assess and evaluate patient's response and provide education to patient for prescribed medication. RN will report any adverse and/or side  effects to prescribing provider.  Therapeutic Interventions: 1 on 1 counseling sessions, Psychoeducation, Medication administration, Evaluate responses to treatment, Monitor vital signs and CBGs as ordered, Perform/monitor CIWA, COWS, AIMS and Fall Risk screenings as ordered, Perform wound care treatments as ordered.  Evaluation of Outcomes: Not Met   LCSW Treatment Plan for Primary Diagnosis: Alcohol-induced mood disorder (HCC) Long Term Goal(s): Safe transition to appropriate next level of care at discharge, Engage patient in therapeutic group addressing interpersonal concerns.  Short Term Goals: Engage patient in aftercare planning with referrals and resources, Increase social support, Increase ability to appropriately verbalize feelings, Increase emotional regulation, Facilitate acceptance of mental health diagnosis and concerns, Facilitate patient progression through stages of change regarding substance use diagnoses and concerns, Identify triggers associated with mental health/substance abuse issues, and Increase skills for wellness and recovery  Therapeutic Interventions: Assess for all discharge needs, 1 to 1 time with Social worker, Explore available resources and support systems, Assess for adequacy in community support network, Educate family and significant other(s) on suicide prevention, Complete Psychosocial Assessment, Interpersonal group therapy.  Evaluation of Outcomes: Not Met   Progress in Treatment: Attending groups: Yes. Participating in groups: Yes. Taking medication as prescribed: Yes. Toleration medication: Yes. Family/Significant other contact made: No, will contact:  Patient's husband.  Patient understands diagnosis: Yes. Discussing patient identified problems/goals with staff: Yes. Medical problems stabilized or resolved: Yes. Denies suicidal/homicidal ideation: Yes. Issues/concerns per patient self-inventory: No. Other: None  New problem(s) identified: No,  Describe:  None  New Short Term/Long Term Goal(s):detox, elimination of symptoms of psychosis, medication management for mood stabilization; elimination of SI thoughts; development of comprehensive mental wellness/sobriety plan.    Patient Goals:  To figure out what's going on with me.  Discharge Plan or Barriers: CSW to assist with the development of appropriate discharge plan.    Reason for Continuation of Hospitalization: Anxiety Depression Suicidal ideation  Estimated Length of Stay: 1-7 days.   Last 3 [place] Suicide Severity Risk Score: Flowsheet Row Admission (Current) from 08/23/2024 in [facility] INPATIENT BEH[facility] MEDICINE ED from 08/20/2024 in [facility] at [facility] ED to Hosp-Admission (Discharged) from 04/28/2024 in [facility] MEDICAL SURGICAL UNIT  C-SSRS RISK CATEGORY No Risk Low Risk No Risk    Last PHQ 2/9 Scores:    08/04/2024    3:28 PM 01/16/2024  2:44 PM 05/17/2023   10:10 AM  Depression screen PHQ 2/9  Decreased Interest 0 0 1  Down, Depressed, Hopeless 0 0 0  PHQ - 2 Score 0 0 1  Altered sleeping 1 1 2   Tired, decreased energy 1 0 2  Change in appetite 2 0 0  Feeling bad or failure about yourself  0 0 1  Trouble concentrating 0 0 0  Moving slowly or fidgety/restless 0 0 0  Suicidal thoughts 0 0 0  PHQ-9 Score 4 1  6    Difficult doing work/chores Not difficult at all Somewhat difficult      Data saved with a previous flowsheet row definition    Scribe for Treatment Team: Nyazia Canevari M Maximilien Hayashi, LCSW 08/24/2024 3:20 PM

## 2024-08-24 NOTE — [facility] Suicide Risk Assessment (Cosign Needed)
 [facility] Admission Suicide Risk Assessment   Nursing information obtained from:  Patient Demographic factors:  Caucasian Current Mental Status:  NA Loss Factors:  NA Historical Factors:  NA Risk Reduction Factors:  Responsible for children under 40 years of age, Sense of responsibility to family, Living with another person, especially a relative  Total Time spent with patient: 1 hour Principal Problem: Alcohol-induced mood disorder (HCC) Diagnosis:  Principal Problem:   Alcohol-induced mood disorder (HCC)  Subjective Data: Patient admitted following reported suicidal statements made to her husband in the context of miscarriage. Patient reports that when she initially learned she was pregnant, it was a miracle given her history of cancer and  loss of her mother. She was very happy about the pregnancy and ceased smoking and drinking. At [redacted] weeks gestation, she began spotting and presented to her OB/GYN, where hCG levels were elevated. The following week, she experienced heavier bleeding, repeat hCG levels were declining, and she was informed she was likely miscarrying. This past Friday, she began passing clots, including one large clot approximately the size of a softball. She was home alone at the time (children at school, husband at work) and drank 2 beers due to being upset. When her children arrived home, she was tearful and informed them of the loss. Her husband entered the room and became argumentative rather than comforting, stating maybe if you would relax and sit down and not be so busy this would have worked out differently. Patient perceived these statements as blame for the miscarriage. She drank an additional beer following this interaction. Patient denies making or remembering any suicidal statements.  Patient currently denies suicidal ideations and reports her goal is to return home to her children. She endorses all of her children as protective factors that give her desire to live. She  denies current homicidal ideations, auditory hallucinations, or visual hallucinations. She endorses anxiety primarily related to being hospitalized and separation from her children. She reports ongoing cramping and passing blood, which medical providers have informed her is normal post-miscarriage. She experienced dizziness this morning which has since resolved. Sleep has been poor with frequent tossing and turning, which she attributes to anxiety.  Patient believes her anxiety and depression are primarily situational due to recent major losses (miscarriage, mother's death).  Patient tearful when discussing recent events but calm and cooperative with provider throughout examination.  Continued Clinical Symptoms:  Alcohol Use Disorder Identification Test Final Score (AUDIT): 1 The Alcohol Use Disorders Identification Test, Guidelines for Use in Primary Care, Second Edition.  World Science Writer [facility]). Score between 0-7:  no or low risk or alcohol related problems. Score between 8-15:  moderate risk of alcohol related problems. Score between 16-19:  high risk of alcohol related problems. Score 20 or above:  warrants further diagnostic evaluation for alcohol dependence and treatment.   CLINICAL FACTORS:   Depression:   Hopelessness Insomnia Alcohol/Substance Abuse/Dependencies   Musculoskeletal: Strength & Muscle Tone: within normal limits Gait & Station: normal Patient leans: N/A  Psychiatric Specialty Exam:  Presentation  General Appearance: Appropriate for Environment  Eye Contact:Good  Speech:Clear and Coherent  Speech Volume:Normal  Handedness:No data recorded  Mood and Affect  Mood:Depressed; Anxious  Affect:Appropriate; Congruent; Tearful   Thought Process  Thought Processes:Coherent; Goal Directed; Linear  Descriptions of Associations:No data recorded Orientation:Full (Time, Place and Person)  Thought Content:Logical  History of  Schizophrenia/Schizoaffective disorder:No  Duration of Psychotic Symptoms:No data recorded Hallucinations:Hallucinations: None  Ideas of Reference:None  Suicidal Thoughts:Suicidal Thoughts: No  Homicidal Thoughts:Homicidal Thoughts: No   Sensorium  Memory:Immediate Good; Recent Fair; Remote Good  Judgment:Fair  Insight:Fair   Executive Functions  Concentration:Fair  Attention Span:Fair  Recall:Fair  Fund of Knowledge:Fair  Language:Fair   Psychomotor Activity  Psychomotor Activity:Psychomotor Activity: Normal   Assets  Assets:Communication Skills; Desire for Improvement; Housing; Social Support   Sleep  Sleep:Sleep: Poor    Physical Exam: Physical Exam Pulmonary:     Effort: Pulmonary effort is normal.  Neurological:     Mental Status: She is alert and oriented to person, place, and time.    Review of Systems  Respiratory:  Negative for shortness of breath.   Cardiovascular:  Negative for chest pain.  Gastrointestinal:  Positive for abdominal pain.  Neurological:  Negative for headaches.  Psychiatric/Behavioral:  Positive for depression. Negative for hallucinations and suicidal ideas. The patient is nervous/anxious and has insomnia.    Blood pressure (!) 126/91, pulse 86, temperature 97.6 F (36.4 C), temperature source Oral, resp. rate 18, height 5' 3 (1.6 m), weight 67.6 kg, SpO2 98%, unknown if currently breastfeeding. Body mass index is 26.39 kg/m.   COGNITIVE FEATURES THAT CONTRIBUTE TO RISK:  None    SUICIDE RISK:   Mild:  Suicidal ideation of limited frequency, intensity, duration, and specificity.  There are no identifiable plans, no associated intent, mild dysphoria and related symptoms, good self-control (both objective and subjective assessment), few other risk factors, and identifiable protective factors, including available and accessible social support.  PLAN OF CARE: Admit patient to the inpatient psychiatric unit for further  monitoring and stabilization  I certify that inpatient services furnished can reasonably be expected to improve the patient's condition.   Zelda Sharps, NP This note was created using Dragon dictation software. Please excuse any inadvertent transcription errors. Case was discussed with supervising physician Dr. Jadapalle who is agreeable with current plan.

## 2024-08-24 NOTE — Progress Notes (Signed)
" °   08/24/24 0600  Psych Admission Type (Psych Patients Only)  Admission Status Voluntary  Psychosocial Assessment  Patient Complaints Sadness  Eye Contact Fair  Facial Expression Sad  Affect Sad  Speech Logical/coherent  Interaction Assertive  Motor Activity Other (Comment) (wnl)  Appearance/Hygiene Unremarkable  Behavior Characteristics Cooperative;Appropriate to situation  Mood Depressed  Aggressive Behavior  Effect No apparent injury  Thought Process  Coherency WDL  Content WDL  Delusions None reported or observed  Perception WDL  Hallucination None reported or observed  Judgment WDL  Confusion None  Danger to Self  Current suicidal ideation? Denies  Danger to Others  Danger to Others None reported or observed    "

## 2024-08-24 NOTE — Group Note (Signed)
 Date:  08/24/2024 Time:  9:53 PM  Group Topic/Focus:  Managing Feelings:   The focus of this group is to identify what feelings patients have difficulty handling and develop a plan to handle them in a healthier way upon discharge.    Participation Level:  Active  Participation Quality:  Appropriate  Affect:  Appropriate  Cognitive:  Appropriate  Insight: Appropriate  Engagement in Group:  Engaged  Modes of Intervention:  Discussion and Support  Additional Comments:    Ginny JONETTA Galeazzi 08/24/2024, 9:53 PM

## 2024-08-25 ENCOUNTER — Ambulatory Visit: Admitting: Adult Health

## 2024-08-25 ENCOUNTER — Other Ambulatory Visit

## 2024-08-25 DIAGNOSIS — F1094 Alcohol use, unspecified with alcohol-induced mood disorder: Secondary | ICD-10-CM

## 2024-08-25 LAB — GLUCOSE, CAPILLARY
Glucose-Capillary: 248 mg/dL — ABNORMAL HIGH (ref 70–99)
Glucose-Capillary: 216 mg/dL — ABNORMAL HIGH (ref 70–99)
Glucose-Capillary: 166 mg/dL — ABNORMAL HIGH (ref 70–99)
Glucose-Capillary: 162 mg/dL — ABNORMAL HIGH (ref 70–99)

## 2024-08-25 MED ORDER — IBUPROFEN 200 MG PO TABS
400.0000 mg | ORAL_TABLET | Freq: Four times a day (QID) | ORAL | Status: DC | PRN
  Administered 2024-08-25: 400 mg via ORAL
  Filled 2024-08-25: qty 2

## 2024-08-25 MED ORDER — SERTRALINE HCL 25 MG PO TABS
50.0000 mg | ORAL_TABLET | Freq: Every day | ORAL | Status: DC
  Administered 2024-08-26: 50 mg via ORAL
  Filled 2024-08-25: qty 2

## 2024-08-25 NOTE — Group Note (Signed)
 Date:  08/25/2024 Time:  5:10 PM  Group Topic/Focus:  Building Self Esteem:   The Focus of this group is helping patients become aware of the effects of self-esteem on their lives, the things they and others do that enhance or undermine their self-esteem, seeing the relationship between their level of self-esteem and the choices they make and learning ways to enhance self-esteem.    Participation Level:  Active  Participation Quality:  Appropriate  Affect:  Appropriate  Cognitive:  Appropriate  Insight: Appropriate  Engagement in Group:  Engaged  Modes of Intervention:  Activity  Additional Comments:    Jillian Shepherd June 08/25/2024, 5:10 PM

## 2024-08-25 NOTE — Progress Notes (Signed)
" °   08/25/24 0900  Psych Admission Type (Psych Patients Only)  Admission Status Voluntary  Psychosocial Assessment  Patient Complaints Crying spells  Eye Contact Fair  Facial Expression Flat  Affect Sad  Speech Logical/coherent  Interaction Assertive  Motor Activity Slow  Appearance/Hygiene Unremarkable  Behavior Characteristics Cooperative  Mood Depressed  Thought Process  Coherency WDL  Content WDL  Delusions None reported or observed  Perception WDL  Hallucination None reported or observed  Judgment WDL  Confusion None  Danger to Self  Current suicidal ideation? Denies  Danger to Others  Danger to Others None reported or observed    "

## 2024-08-25 NOTE — Group Note (Signed)
 [facility] LCSW Group Therapy Note   Group Date: 08/25/2024 Start Time: 1335 End Time: 1415  Type of Therapy/Topic:  Group Therapy:  Feelings about Diagnosis  Participation Level:  Did Not Attend   Description of Group:    This group will allow patients to explore their thoughts and feelings about diagnoses they have received. Patients will be guided to explore their level of understanding and acceptance of these diagnoses. Facilitator will encourage patients to process their thoughts and feelings about the reactions of others to their diagnosis, and will guide patients in identifying ways to discuss their diagnosis with significant others in their lives. This group will be process-oriented, with patients participating in exploration of their own experiences as well as giving and receiving support and challenge from other group members.   Therapeutic Goals: 1. Patient will demonstrate understanding of diagnosis as evidence by identifying two or more symptoms of the disorder:  2. Patient will be able to express two feelings regarding the diagnosis 3. Patient will demonstrate ability to communicate their needs through discussion and/or role plays  Summary of Patient Progress:    X    Therapeutic Modalities:   Cognitive Behavioral Therapy Brief Therapy Feelings Identification    Sherryle JINNY Margo, LCSW

## 2024-08-25 NOTE — [facility] Suicide Risk Assessment (Signed)
 [facility] INPATIENT:  Family/Significant Other Suicide Prevention Education  Suicide Prevention Education:  Education Completed; Beulah Capobianco, [phone], Husband  , has been identified by the patient as the family member/significant other with whom the patient will be residing, and identified as the person(s) who will aid the patient in the event of a mental health crisis (suicidal ideations/suicide attempt).  With written consent from the patient, the family member/significant other has been provided the following suicide prevention education, prior to the and/or following the discharge of the patient.  The suicide prevention education provided includes the following: Suicide risk factors Suicide prevention and interventions National Suicide Hotline telephone number [facility] assessment telephone number [facility] Emergency Assistance 911 [facility] and/or Residential Mobile Crisis Unit telephone number  Request made of family/significant other to: Remove weapons (e.g., guns, rifles, knives), all items previously/currently identified as safety concern.   Remove drugs/medications (over-the-counter, prescriptions, illicit drugs), all items previously/currently identified as a safety concern.  The family member/significant other verbalizes understanding of the suicide prevention education information provided.  The family member/significant other agrees to remove the items of safety concern listed above.  According to husband, the patient had a lot of stress on her but who isnt stressed? Husband reported that raising the 3 children, losing a child and paying bills have been primary stressors for the patient. Husband reported as long as shes not drinking, shes fine. Husband explained that the night of the patients recent episode, she had a little too much to drink, Husband reported that he doesn't believe the patient to be a danger to herself or others and confirmed no  weapons in the home. Husband reported that the patient can return home at discharge and that he will provide transportation. Husband reported no present safety concerns.    Alee Gressman M Demere Dotzler 08/25/2024, 4:11 PM

## 2024-08-25 NOTE — Progress Notes (Signed)
 " [facility] MD Progress Note  08/25/2024 3:23 PM Jillian Shepherd  MRN:  [id number]  Patient admitted following reported suicidal statements made to her husband in the context of miscarriage. Patient reports that when she initially learned she was pregnant, it was a miracle given her history of cancer and loss of her mother. She was very happy about the pregnancy and ceased smoking and drinking. At [redacted] weeks gestation, she began spotting and presented to her OB/GYN, where hCG levels were elevated. The following week, she experienced heavier bleeding, repeat hCG levels were declining, and she was informed she was likely miscarrying. This past Friday, she began passing clots, including one large clot approximately the size of a softball. She was home alone at the time (children at school, husband at work) and drank 2 beers due to being upset. When her children arrived home, she was tearful and informed them of the loss. Her husband entered the room and became argumentative rather than comforting, stating maybe if you would relax and sit down and not be so busy this would have worked out differently. Patient perceived these statements as blame for the miscarriage. She drank an additional beer following this interaction. Patient denies making or remembering any suicidal statements.   Subjective:  Chart reviewed, case discussed in multidisciplinary meeting, patient seen during rounds.   1/20: Patient found resting in room during rounds. She is pleasant and cooperative on contact. She denies SI, HI, and AVH. She reflects on the day of admission stating that she has found out she miscarried and this was hard for her. She drank two beers and when her husband came home, he was cold and dismissive to her. They had conflicting desires with the pregnancy and he did not want to keep it. She identifies that he is not emotionally supportive and this is hard for her, because her needs include emotional support.   She endorses  staying in her room at times due to cramping. She denies any concerns/issues. She is discharged focused to get back to her children and family. She denies concerns for the medication and is agreeable to an increase in dosing.    Past Psychiatric History: see h&P Family History:  Family History  Problem Relation Age of Onset   Hypertension Father    Anxiety disorder Father    Depression Father    Melanoma Paternal Grandmother        of skin   Diabetes Paternal Grandmother    Cirrhosis Paternal Uncle    Social History:  Social History   Substance and Sexual Activity  Alcohol Use Yes   Alcohol/week: 1.0 standard drink of alcohol   Types: 1 Cans of beer per week     Social History   Substance and Sexual Activity  Drug Use No    Social History   Socioeconomic History   Marital status: Married    Spouse name: Not on file   Number of children: Not on file   Years of education: Not on file   Highest education level: Not on file  Occupational History   Not on file  Tobacco Use   Smoking status: Former    Current packs/day: 0.25    Average packs/day: 0.3 packs/day for 9.0 years (2.3 ttl pk-yrs)    Types: Cigarettes    Passive exposure: Current   Smokeless tobacco: Never  Vaping Use   Vaping status: Never Used  Substance and Sexual Activity   Alcohol use: Yes    Alcohol/week: 1.0 standard  drink of alcohol    Types: 1 Cans of beer per week   Drug use: No   Sexual activity: Yes    Birth control/protection: None  Other Topics Concern   Not on file  Social History Narrative   ** Merged History Encounter **       Social Drivers of Health   Tobacco Use: Medium Risk (08/23/2024)   Patient History    Smoking Tobacco Use: Former    Smokeless Tobacco Use: Never    Passive Exposure: Current  Physicist, Medical Strain: Not on file  Food Insecurity: No Food Insecurity (08/23/2024)   Epic    Worried About Programme Researcher, Broadcasting/film/video in the Last Year: Never true    Ran Out of Food  in the Last Year: Never true  Transportation Needs: No Transportation Needs (08/23/2024)   Epic    Lack of Transportation (Medical): No    Lack of Transportation (Non-Medical): No  Physical Activity: Not on file  Stress: Not on file  Social Connections: Socially Isolated (08/23/2024)   Social Connection and Isolation Panel    Frequency of Communication with Friends and Family: Once a week    Frequency of Social Gatherings with Friends and Family: Once a week    Attends Religious Services: Patient declined    Database Administrator or Organizations: No    Attends Banker Meetings: Never    Marital Status: Married  Depression (PHQ2-9): Low Risk (08/04/2024)   Depression (PHQ2-9)    PHQ-2 Score: 4  Alcohol Screen: Low Risk (08/23/2024)   Alcohol Screen    Last Alcohol Screening Score (AUDIT): 1  Housing: Unknown (08/23/2024)   Epic    Unable to Pay for Housing in the Last Year: No    Number of Times Moved in the Last Year: 0    Homeless in the Last Year: Not on file  Utilities: Not At Risk (08/23/2024)   Epic    Threatened with loss of utilities: No  Health Literacy: Not on file   Past Medical History:  Past Medical History:  Diagnosis Date   Abdominal pain    Alcohol abuse    B12 deficiency    Cancer (HCC)    squmaous cell skin cancer    Collagen vascular disease    Diabetes mellitus without complication (HCC)    Hypertension    Hypokalemia    Liddle's syndrome    Obesity (BMI 35.0-39.9 without comorbidity)    Pancreatitis    Pleural effusion    Splenic hemorrhage     Past Surgical History:  Procedure Laterality Date   ADENOIDECTOMY     CHEST TUBE INSERTION     COLONOSCOPY WITH PROPOFOL  N/A 10/06/2020   Procedure: COLONOSCOPY WITH PROPOFOL ;  Surgeon: Unk Corinn Skiff, MD;  Location: [facility] ENDOSCOPY;  Service: Gastroenterology;  Laterality: N/A;  COVID POSITIVE 08/23/2020   ESOPHAGOGASTRODUODENOSCOPY (EGD) WITH PROPOFOL  N/A 10/06/2020   Procedure:  ESOPHAGOGASTRODUODENOSCOPY (EGD) WITH PROPOFOL ;  Surgeon: Unk Corinn Skiff, MD;  Location: [facility] ENDOSCOPY;  Service: Gastroenterology;  Laterality: N/A;   skin cancer removal Left    leg   TONSILLECTOMY      Current Medications: Current Facility-Administered Medications  Medication Dose Route Frequency Provider Last Rate Last Admin   acetaminophen  (TYLENOL ) tablet 650 mg  650 mg Oral Q6H PRN Smith, Annie B, NP   650 mg at 08/25/24 0811   alum & mag hydroxide-simeth (MAALOX/MYLANTA) 200-200-20 MG/5ML suspension 30 mL  30 mL Oral Q4H PRN Smith, Annie  B, NP       folic acid  (FOLVITE ) tablet 1 mg  1 mg Oral Daily Smith, Annie B, NP   1 mg at 08/25/24 9187   ibuprofen  (ADVIL ) tablet 400 mg  400 mg Oral Q6H PRN Kajuana Shareef, NP   400 mg at 08/25/24 1346   insulin  aspart (novoLOG ) injection 0-5 Units  0-5 Units Subcutaneous QHS Smith, Annie B, NP   3 Units at 08/23/24 2111   insulin  aspart (novoLOG ) injection 0-9 Units  0-9 Units Subcutaneous TID WC Smith, Annie B, NP   3 Units at 08/25/24 1218   insulin  aspart (novoLOG ) injection 4 Units  4 Units Subcutaneous TID WC Smith, Annie B, NP   4 Units at 08/25/24 1217   insulin  glargine (LANTUS ) injection 18 Units  18 Units Subcutaneous QHS Smith, Annie B, NP   18 Units at 08/24/24 2110   LORazepam  (ATIVAN ) tablet 0-4 mg  0-4 mg Oral Q12H Smith, Annie B, NP   1 mg at 08/24/24 2100   magnesium  hydroxide (MILK OF MAGNESIA) suspension 30 mL  30 mL Oral Daily PRN Smith, Annie B, NP       melatonin tablet 5 mg  5 mg Oral QHS Smith, Annie B, NP   5 mg at 08/24/24 2100   multivitamin with minerals tablet 1 tablet  1 tablet Oral Daily Smith, Annie B, NP   1 tablet at 08/25/24 9187   nicotine  (NICODERM CQ  - dosed in mg/24 hours) patch 14 mg  14 mg Transdermal Daily Jadapalle, Sree, MD   14 mg at 08/25/24 9188   nicotine  polacrilex (NICORETTE ) gum 2 mg  2 mg Oral PRN Madaram, Kondal R, MD       OLANZapine  (ZYPREXA ) injection 10 mg  10 mg Intramuscular TID PRN  Smith, Annie B, NP       OLANZapine  (ZYPREXA ) injection 5 mg  5 mg Intramuscular TID PRN Smith, Annie B, NP       OLANZapine  zydis (ZYPREXA ) disintegrating tablet 5 mg  5 mg Oral TID PRN Smith, Annie B, NP       sertraline  (ZOLOFT ) tablet 25 mg  25 mg Oral Daily Smith, Annie B, NP   25 mg at 08/25/24 9187   thiamine  (VITAMIN B1) tablet 100 mg  100 mg Oral Daily Smith, Annie B, NP   100 mg at 08/25/24 9187   Or   thiamine  (VITAMIN B1) injection 100 mg  100 mg Intravenous Daily Smith, Annie B, NP        Lab Results:  Results for orders placed or performed during the hospital encounter of 08/23/24 (from the past 48 hours)  Glucose, capillary     Status: Abnormal   Collection Time: 08/23/24  4:42 PM  Result Value Ref Range   Glucose-Capillary 291 (H) 70 - 99 mg/dL    Comment: Glucose reference range applies only to samples taken after fasting for at least 8 hours.  Glucose, capillary     Status: Abnormal   Collection Time: 08/23/24  8:39 PM  Result Value Ref Range   Glucose-Capillary 245 (H) 70 - 99 mg/dL    Comment: Glucose reference range applies only to samples taken after fasting for at least 8 hours.  Glucose, capillary     Status: Abnormal   Collection Time: 08/24/24  6:41 AM  Result Value Ref Range   Glucose-Capillary 249 (H) 70 - 99 mg/dL    Comment: Glucose reference range applies only to samples taken after fasting for at  least 8 hours.  Glucose, capillary     Status: Abnormal   Collection Time: 08/24/24 11:46 AM  Result Value Ref Range   Glucose-Capillary 259 (H) 70 - 99 mg/dL    Comment: Glucose reference range applies only to samples taken after fasting for at least 8 hours.  Glucose, capillary     Status: Abnormal   Collection Time: 08/24/24  5:08 PM  Result Value Ref Range   Glucose-Capillary 357 (H) 70 - 99 mg/dL    Comment: Glucose reference range applies only to samples taken after fasting for at least 8 hours.  Glucose, capillary     Status: Abnormal   Collection  Time: 08/24/24  8:04 PM  Result Value Ref Range   Glucose-Capillary 123 (H) 70 - 99 mg/dL    Comment: Glucose reference range applies only to samples taken after fasting for at least 8 hours.  Glucose, capillary     Status: Abnormal   Collection Time: 08/25/24  7:36 AM  Result Value Ref Range   Glucose-Capillary 248 (H) 70 - 99 mg/dL    Comment: Glucose reference range applies only to samples taken after fasting for at least 8 hours.  Glucose, capillary     Status: Abnormal   Collection Time: 08/25/24 11:25 AM  Result Value Ref Range   Glucose-Capillary 216 (H) 70 - 99 mg/dL    Comment: Glucose reference range applies only to samples taken after fasting for at least 8 hours.    Blood Alcohol level:  Lab Results  Component Value Date   ETH 263 (H) 08/20/2024   ETH <15 02/01/2024    Metabolic Disorder Labs: Lab Results  Component Value Date   HGBA1C 11.1 (H) 04/29/2024   MPG 271.87 04/29/2024   MPG 312 01/16/2024   No results found for: PROLACTIN Lab Results  Component Value Date   CHOL 166 02/01/2024   TRIG 140 02/01/2024   HDL 39 (L) 02/01/2024   CHOLHDL 4.3 02/01/2024   VLDL 28 02/01/2024   LDLCALC 99 02/01/2024   LDLCALC 116 (H) 08/15/2023    Physical Findings: AIMS:  , ,  ,  ,    CIWA:  CIWA-Ar Total: 0 COWS:      Psychiatric Specialty Exam:  Presentation  General Appearance:  Appropriate for Environment; Casual  Eye Contact: Good  Speech: Clear and Coherent; Normal Rate  Speech Volume: Normal    Mood and Affect  Mood: Dysphoric  Affect: Appropriate; Congruent   Thought Process  Thought Processes: Coherent; Goal Directed; Linear  Orientation:Full (Time, Place and Person)  Thought Content:Logical  Hallucinations:Hallucinations: None  Ideas of Reference:None  Suicidal Thoughts:Suicidal Thoughts: No  Homicidal Thoughts:Homicidal Thoughts: No   Sensorium  Memory: Immediate Good; Recent Fair; Remote  Good  Judgment: Fair  Insight: Fair   Chartered Certified Accountant: Fair  Attention Span: Fair  Recall: [employer] of Knowledge: Fair  Language: Fair   Psychomotor Activity  Psychomotor Activity: Psychomotor Activity: Normal  Musculoskeletal: Strength & Muscle Tone: within normal limits Gait & Station: normal Assets  Assets: Manufacturing Systems Engineer; Desire for Improvement; Housing; Social Support    Physical Exam: Physical Exam Vitals and nursing note reviewed.  Constitutional:      Appearance: Normal appearance.  Neurological:     Mental Status: She is alert and oriented to person, place, and time.  Psychiatric:        Behavior: Behavior normal.        Thought Content: Thought content normal.  Review of Systems  Respiratory:  Negative for shortness of breath.   Cardiovascular:  Negative for chest pain.  Gastrointestinal:  Negative for diarrhea, nausea and vomiting.  Psychiatric/Behavioral:  Positive for depression. Negative for hallucinations and suicidal ideas. The patient is not nervous/anxious.   All other systems reviewed and are negative.  Blood pressure (!) 135/94, pulse 79, temperature 97.6 F (36.4 C), temperature source Oral, resp. rate 15, height 5' 3 (1.6 m), weight 67.6 kg, SpO2 100%, unknown if currently breastfeeding. Body mass index is 26.39 kg/m.  Diagnosis: Principal Problem:   Alcohol-induced mood disorder (HCC)   PLAN: Safety and Monitoring:  -- Involuntary admission to inpatient psychiatric unit for safety, stabilization and treatment  -- Daily contact with patient to assess and evaluate symptoms and progress in treatment  -- Patient's case to be discussed in multi-disciplinary team meeting  -- Observation Level : q15 minute checks  -- Vital signs:  q12 hours  -- Precautions: suicide, elopement, and assault -- Encouraged patient to participate in unit milieu and in scheduled group therapies   2. Psychiatric  Treatment:  Scheduled Medications: Sertraline  50 mg daily    -- The risks/benefits/side-effects/alternatives to this medication were discussed in detail with the patient and time was given for questions. The patient consents to medication trial.  3. Medical Issues Being Addressed:  NRT Ibuprofen  for cramping Insulin  regimen CIWA protocol   4. Discharge Planning:   -- Social work and case management to assist with discharge planning and identification of hospital follow-up needs prior to discharge  -- Estimated LOS: 3-4 days  Bently Wyss, NP 08/25/2024, 3:23 PM  "

## 2024-08-25 NOTE — Plan of Care (Signed)
   Problem: Education: Goal: Emotional status will improve Outcome: Progressing

## 2024-08-25 NOTE — Group Note (Signed)
 Recreation Therapy Group Note   Group Topic:Stress Management  Group Date: 08/25/2024 Start Time: 1530 End Time: 1615 Facilitators: Celestia Jeoffrey BRAVO, LRT, CTRS Location: Dayroom  Group Description: UNO. LRT and pts played games of UNO. LRT prompted group discussion on the physical signs and symptoms of stress, like those you feel when playing a competitive card game. LRT and pt discussed the physical and mental signs of stress, as well as coping skills to manage them.   Goal Area(s) Addressed: Patient will identify physical symptoms of stress. Patient will identify coping skills for stress. Patient will build frustration tolerance skills.  Patient will increase communication.    Affect/Mood: Appropriate   Participation Level: Active and Engaged   Participation Quality: Independent   Behavior: Calm and Cooperative   Speech/Thought Process: Coherent   Insight: Good   Judgement: Good   Modes of Intervention: Competitive Play   Patient Response to Interventions:  Attentive, Engaged, Interested , and Receptive   Education Outcome:  Acknowledges education   Clinical Observations/Individualized Feedback: Jakira was active in their participation of session activities and group discussion. Pt interacted well with LRT and peers duration of session.    Plan: Continue to engage patient in RT group sessions 2-3x/week.   [address], LRT, CTRS 08/25/2024 5:25 PM

## 2024-08-25 NOTE — [facility] Counselor (Signed)
 Suicide prevention education completed via phone with Brette Cast, [phone], Husband.   According to husband, the patient had a lot of stress on her but who isnt stressed? Husband reported that raising the 3 children, losing a child and paying bills have been primary stressors for the patient. Husband reported as long as shes not drinking, shes fine. Husband explained that the night of the patients recent episode, she had a little too much to drink, Husband reported that he doesn't believe the patient to be a danger to herself or others and confirmed no weapons in the home. Husband reported that the patient can return home at discharge and that he will provide transportation. Husband reported no present safety concerns.    Albany Winslow, MSW, LCSWA 08/25/2024 4:11 PM

## 2024-08-25 NOTE — BHH Counselor (Signed)
 Adult Comprehensive Assessment  Patient ID: Jillian Shepherd, female   DOB: August 10, 1985, 39 y.o.   MRN: [id number]  Information Source: Information source: Patient  Current Stressors:  Patient states their primary concerns and needs for treatment are:: I went to my OBGYN and I was expericing rapid bleeding. It broke my heart. She told me that my A1C levels were declining and to come back on the 09/14/2024 to see if it had passed. Patient states their goals for this hospitilization and ongoing recovery are:: I didn't think I needed to be here but if I'm here it's because I'm upset after losing my pregnancy. Educational / Learning stressors: Patient denied. Employment / Job issues: I'm on disability. Family Relationships: Patient denied. Financial / Lack of resources (include bankruptcy): Patient denied. Housing / Lack of housing: Patient denied. Physical health (include injuries & life threatening diseases): I have diabetes. Social relationships: I don't really have many friends. Substance abuse: After she told me I was miscarrying, I went home and had 2 beers. Patient reported no substance use. Bereavement / Loss: Once my mom passed 3 years ago, it took a toll on me. It was on my sister's birthday.  Living/Environment/Situation:  Living Arrangements: Spouse/significant other, Children Living conditions (as described by patient or guardian): WNL Who else lives in the home?: Patient lives with her husband and her children. How long has patient lived in current situation?: It's been awhile. What is atmosphere in current home: Comfortable  Family History:  Marital status: Married Number of Years Married: 10 What types of issues is patient dealing with in the relationship?: Whenever I get emotional, he doesn't want to deal with it. He wants to blow it off. Additional relationship information: Been together 22 years, been married for 10 years. Are you sexually active?: Yes What  is your sexual orientation?: Heterosexual Has your sexual activity been affected by drugs, alcohol, medication, or emotional stress?: Patient denied. Does patient have children?: Yes How many children?: 3 How is patient's relationship with their children?: I love my boys, they're just headstrong. I love my daughter. She's my princess. She's my mini me.  Childhood History:  By whom was/is the patient raised?: Both parents Additional childhood history information: Patient reported that her parent's divorced when she was 30 years old. Description of patient's relationship with caregiver when they were a child: My daddy was a heavy drinker. My mom worked 3 jobs. Patient's description of current relationship with people who raised him/her: He's has the motherly intuition but from my father. He has some bad health problems. Does patient have siblings?: Yes Number of Siblings: 2 Description of patient's current relationship with siblings: I love them to death. Did patient suffer any verbal/emotional/physical/sexual abuse as a child?: No Did patient suffer from severe childhood neglect?: No Has patient ever been sexually abused/assaulted/raped as an adolescent or adult?: No Was the patient ever a victim of a crime or a disaster?: No Witnessed domestic violence?: Yes Has patient been affected by domestic violence as an adult?: No Description of domestic violence: My mom's brother and my dad.  Education:  Highest grade of school patient has completed: 11th grade. Currently a student?: No Learning disability?: No  Employment/Work Situation:   Employment Situation: On disability Why is Patient on Disability: Medical How Long has Patient Been on Disability: 2 years. Patient's Job has Been Impacted by Current Illness: Yes Describe how Patient's Job has Been Impacted: I passed out at work and ended up at [facility]. What is  the Longest Time Patient has Held a Job?: 5  years. 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 Has Patient ever Been in the [employer]?: No  Financial Resources:   Financial resources: Insurance Claims Handler, [place] Does patient have a lawyer or guardian?: No  Alcohol/Substance Abuse:   What has been your use of drugs/alcohol within the last 12 months?: After she told me I was miscarrying, I went home and had 2 beers. Patient reported no substance use. If attempted suicide, did drugs/alcohol play a role in this?: No Alcohol/Substance Abuse Treatment Hx: Denies past history If yes, describe treatment and response: Patient denied. Is patient motivated for change?: Yes Does patient live in an environment that promotes recovery or serves as an obstacle to recovery?: Yes - promotes recovery Describe how the environment promotes recovery or serves as an obstacle to recovery: Patient reported that she feels supported by her husband and children. Are others in the home using alcohol or other substances?: No Are significant others in the home willing to participate in the patient's care?: Yes Describe significant others willing to participate in the patient's care: Patient reported that her husband is supportive in her care. Has alcohol/substance abuse ever caused legal problems?: No  Social Support System:   Patient's Community Support System: Good Describe Community Support System: My husband, my children, my sisters, my dad. Type of faith/religion: I'm Christian. How does patient's faith help to cope with current illness?: I pray.  Leisure/Recreation:   Do You Have Hobbies?: Yes Leisure and Hobbies: I draw, I crochet, I love to cook.  Strengths/Needs:   What is the patient's perception of their strengths?: I'm a really people person. Patient states they can use these personal strengths during their treatment to contribute to their recovery: I love hard. Patient states these barriers  may affect/interfere with their treatment: None reported. Patient states these barriers may affect their return to the community: None reported. Other important information patient would like considered in planning for their treatment: Patient would like therapy and psychiatry.  Discharge Plan:   Currently receiving community mental health services: No Patient states concerns and preferences for aftercare planning are: Patient would like therapy and psychiatry. Patient states they will know when they are safe and ready for discharge when: Now. Does patient have access to transportation?: Yes (Patient's sister.) Does patient have financial barriers related to discharge medications?: No Patient description of barriers related to discharge medications: None reported. Will patient be returning to same living situation after discharge?: Yes  Summary/Recommendations:   Summary and Recommendations (to be completed by the evaluator): Patient is a 39 year old woman [place], [facility] [place]) who presented with significant aggressive behaviors at home. Patient reportedly has been drinking excessively according to chart. During assessment with this clinical research associate, patient reported I went to my OBGYN and I was experiencing rapid bleeding. It broke my heart. She told me that my A1C levels were declining and to come back on the 09/20/24 to see if it had passed. Patient currently resides with her husband and children and described the atmosphere as comfortable. Patient is currently unemployed but has been receiving disability for 2 years for physical health. Patient reported having diabetes. When asked of substance use, patient reported After she told me I was miscarrying, I went home and had 2 beers. Patient reported no substance use. Patient has been married for 10 years and reported Whenever I get emotional, he doesn't want to deal with it. He  wants to blow it off. Patient endorsed receiving Good support from My  husband, my children, my sisters, my dad. Patient is currently not followed by a therapist or psychiatrist but would like a referral at discharge. Patient denied SI, HI and AVH. Patients current diagnosis is Alcohol-induced mood disorder (HCC). Recommendations include: crisis stabilization, therapeutic milieu, encourage group attendance and participation, medication management for mood stabilization and development of a comprehensive mental wellness/sobriety plan.  Reghan Thul M Cory Rama. 08/25/2024

## 2024-08-25 NOTE — Group Note (Signed)
 Date:  08/25/2024 Time:  8:57 PM  Group Topic/Focus:  Orientation:   The focus of this group is to educate the patient on the purpose and policies of crisis stabilization and provide a format to answer questions about their admission.  The group details unit policies and expectations of patients while admitted. Wrap-Up Group:   The focus of this group is to help patients review their daily goal of treatment and discuss progress on daily workbooks.    Participation Level:  Active  Participation Quality:  Appropriate and Attentive  Affect:  Appropriate  Cognitive:  Alert and Appropriate  Insight: Appropriate and Good  Engagement in Group:  Engaged  Modes of Intervention:  Orientation  Additional Comments:     Jillian Shepherd Servant 08/25/2024, 8:57 PM

## 2024-08-25 NOTE — Progress Notes (Addendum)
" °   08/25/24 0624 08/25/24 0809  Vital Signs  BP (!) 125/94 (!) 135/94   Notified MD Ruther, MD Donnelly armin Merle NP of elevated DBP. No new orders at this time. Continue to monitor  "

## 2024-08-25 NOTE — Plan of Care (Signed)
   Problem: Education: Goal: Emotional status will improve Outcome: Progressing Goal: Mental status will improve Outcome: Progressing

## 2024-08-25 NOTE — Group Note (Signed)
 Recreation Therapy Group Note   Group Topic:Animal Assisted Therapy   Group Date: 08/25/2024 Start Time: 1000 End Time: 1030 Facilitators: Celestia Jeoffrey BRAVO, LRT, CTRS Location: Dayroom  Group Description: AAA. Animal-Assisted Activity provides opportunities for motivational, educational, therapeutic and/or recreational benefits to enhance quality of life. [name] and Rollo visited the unit to interact with patients.   Goal Areas Addressed:  Reduced anxiety and stress Improved mood Increased social interaction Enhanced communication skills Reduced loneliness and isolation Improved emotional regulation   Affect/Mood: Appropriate   Participation Level: Active and Engaged   Participation Quality: Independent   Behavior: Calm and Cooperative   Speech/Thought Process: Coherent   Insight: Good   Judgement: Good   Modes of Intervention: Activity   Patient Response to Interventions:  Attentive, Engaged, and Interested    Education Outcome:  Acknowledges education   Clinical Observations/Individualized Feedback: Jillian Shepherd was active in their participation of session activities and group discussion. Pt interacted well with LRT and peers duration of session.    Plan: Continue to engage patient in RT group sessions 2-3x/week.   Jeoffrey BRAVO Celestia, LRT, CTRS 08/25/2024 11:40 AM

## 2024-08-25 NOTE — Progress Notes (Signed)
 Pt calm and pleasant during assessment denying SI/HI/AVH. Pt observed by this Clinical research associate interacting appropriately with staff and peers on the unit. Pt compliant with medication administration per MD orders. Pt given education, support, and encouragement to be active in her treatment plan. Pt being monitored Q 15 minutes for safety per unit protocol, remains safe on the unit

## 2024-08-26 LAB — GLUCOSE, CAPILLARY
Glucose-Capillary: 131 mg/dL — ABNORMAL HIGH (ref 70–99)
Glucose-Capillary: 232 mg/dL — ABNORMAL HIGH (ref 70–99)
Glucose-Capillary: 231 mg/dL — ABNORMAL HIGH (ref 70–99)
Glucose-Capillary: 234 mg/dL — ABNORMAL HIGH (ref 70–99)

## 2024-08-26 MED ORDER — FOLIC ACID 1 MG PO TABS: 1.0000 mg | ORAL_TABLET | Freq: Every day | ORAL | 0 refills | Status: AC

## 2024-08-26 MED ORDER — SERTRALINE HCL 50 MG PO TABS: 50.0000 mg | ORAL_TABLET | Freq: Every day | ORAL | 0 refills | Status: AC

## 2024-08-26 MED ORDER — NICOTINE 14 MG/24HR TD PT24: 14.0000 mg | MEDICATED_PATCH | Freq: Every day | TRANSDERMAL | 0 refills | Status: AC

## 2024-08-26 MED ORDER — NICOTINE POLACRILEX 2 MG MT GUM: 2.0000 mg | CHEWING_GUM | OROMUCOSAL | 0 refills | Status: AC | PRN

## 2024-08-26 MED ORDER — INSULIN ASPART 100 UNIT/ML IJ SOLN: 4.0000 [IU] | Freq: Three times a day (TID) | INTRAMUSCULAR | 11 refills | Status: AC

## 2024-08-26 MED ORDER — ADULT MULTIVITAMIN W/MINERALS CH: 1.0000 | ORAL_TABLET | Freq: Every day | ORAL | 0 refills | Status: AC

## 2024-08-26 MED ORDER — VITAMIN B-1 100 MG PO TABS: 100.0000 mg | ORAL_TABLET | Freq: Every day | ORAL | 0 refills | Status: AC

## 2024-08-26 MED ORDER — MELATONIN 5 MG PO TABS: 5.0000 mg | ORAL_TABLET | Freq: Every day | ORAL | 0 refills | Status: AC

## 2024-08-26 MED ORDER — INSULIN GLARGINE 100 UNIT/ML ~~LOC~~ SOLN: 18.0000 [IU] | Freq: Every day | SUBCUTANEOUS | 11 refills | Status: AC

## 2024-08-26 NOTE — Group Note (Signed)
 Date:  08/26/2024 Time:  5:19 PM  Group Topic/Focus:  Building Self Esteem:   The Focus of this group is helping patients become aware of the effects of self-esteem on their lives, the things they and others do that enhance or undermine their self-esteem, seeing the relationship between their level of self-esteem and the choices they make and learning ways to enhance self-esteem.    Participation Level:  Active  Participation Quality:  Appropriate  Affect:  Appropriate  Cognitive:  Appropriate  Insight: Appropriate  Engagement in Group:  Engaged  Modes of Intervention:  Activity  [name] 08/26/2024, 5:19 PM

## 2024-08-26 NOTE — Group Note (Signed)
 [facility] LCSW Group Therapy Note   Group Date: 08/26/2024 Start Time: 1300 End Time: 1400   Type of Therapy/Topic:  Group Therapy:  Emotion Regulation  Participation Level:  Active   Description of Group:    The purpose of this group is to assist patients in learning to regulate negative emotions and experience positive emotions. Patients will be guided to discuss ways in which they have been vulnerable to their negative emotions. These vulnerabilities will be juxtaposed with experiences of positive emotions or situations, and patients challenged to use positive emotions to combat negative ones. Special emphasis will be placed on coping with negative emotions in conflict situations, and patients will process healthy conflict resolution skills.  Therapeutic Goals: Patient will identify two positive emotions or experiences to reflect on in order to balance out negative emotions:  Patient will label two or more emotions that they find the most difficult to experience:  Patient will be able to demonstrate positive conflict resolution skills through discussion or role plays:   Summary of Patient Progress: Patient was present for the entirety of the group process. She was actively engaged in the conversation and her comments helped further the discussion. Pt appeared to have some insight into herself and the topic. She appeared open and receptive to feedback/comments from both her peers and the facilitator.  Therapeutic Modalities:   Cognitive Behavioral Therapy Feelings Identification Dialectical Behavioral Therapy   Nadara JONELLE Fam, LCSW

## 2024-08-26 NOTE — BHH Counselor (Signed)
 CSW touched base with Alm Qua, [phone], Husband  to engage in safe discharge planning.   Husband reported no safety concerns and will provide transportation at discharge.   Husband is in agreement with patient's discharge.   Iyana Topor, MSW, LCSWA 08/26/2024 3:37 PM

## 2024-08-26 NOTE — Progress Notes (Signed)
 Patient ID: Jillian Shepherd, female   DOB: 01-Apr-1986, 39 y.o.   MRN: [id number]  1725 Staff escort Pt OOF to home via transportation/Husband. Pt given all D/C paperwork. Pt did not have any belongings to return. Pt A/O x4 Hx: ETOH. Pt denies SI/HI; A/V/H. All meds given as ordered. No distress noted.

## 2024-08-26 NOTE — Group Note (Signed)
 Date:  08/26/2024 Time:  1:42 PM  Group Topic/Focus:  Stages of Change:   The focus of this group is to explain the stages of change and help patients identify changes they want to make upon discharge.    Participation Level:  Active  Participation Quality:  Appropriate  Affect:  Appropriate  Cognitive:  Appropriate  Insight: Appropriate  Engagement in Group:  Engaged  Modes of Intervention:  Activity  Additional Comments:    Jillian Shepherd 08/26/2024, 1:42 PM

## 2024-08-26 NOTE — Plan of Care (Signed)

## 2024-08-26 NOTE — Progress Notes (Signed)
" °  [facility] Adult Case Management Discharge Plan :  Will you be returning to the same living situation after discharge:  Yes,  Patient to return home.  At discharge, do you have transportation home?: Yes,  Patient's husband to provide transportation.  Do you have the ability to pay for your medications: Yes,  [facility]  Release of information consent forms completed and in the chart;  Patient's signature needed at discharge.  Patient to Follow up at:  Follow-up Information     [facility] Follow up.   Why: In person assessment for therapy and medication management is 08/28/24 at 11:00 AM. Contact information: [address] [place] [address] [phone]                 Next level of care provider has access to [facility] Link:no  Safety Planning and Suicide Prevention discussed: Yes,  Neysha Criado, [phone], Husband       Has patient been referred to the Quitline?: Patient refused referral for treatment  Patient has been referred for addiction treatment: Yes, the patient will follow up with an outpatient provider for substance use disorder. Psychiatrist/APP: appointment made and Therapist: appointment made  Alveta CHRISTELLA Kerns, LCSW 08/26/2024, 3:34 PM "

## 2024-08-26 NOTE — Plan of Care (Signed)
  Problem: Education: Goal: Knowledge of [facility] Education information/materials will improve Outcome: Progressing Goal: Emotional status will improve Outcome: Progressing Goal: Mental status will improve Outcome: Progressing Goal: Verbalization of understanding the information provided will improve Outcome: Progressing   Problem: Activity: Goal: Interest or engagement in activities will improve Outcome: Progressing Goal: Sleeping patterns will improve Outcome: Progressing   Problem: Coping: Goal: Ability to verbalize frustrations and anger appropriately will improve Outcome: Progressing Goal: Ability to demonstrate self-control will improve Outcome: Progressing   Problem: Health Behavior/Discharge Planning: Goal: Identification of resources available to assist in meeting health care needs will improve Outcome: Progressing Goal: Compliance with treatment plan for underlying cause of condition will improve Outcome: Progressing   Problem: Physical Regulation: Goal: Ability to maintain clinical measurements within normal limits will improve Outcome: Progressing   Problem: Safety: Goal: Periods of time without injury will increase Outcome: Progressing   Problem: Education: Goal: Knowledge of [facility] Education information/materials will improve Outcome: Progressing Goal: Emotional status will improve Outcome: Progressing Goal: Mental status will improve Outcome: Progressing Goal: Verbalization of understanding the information provided will improve Outcome: Progressing   Problem: Activity: Goal: Interest or engagement in activities will improve Outcome: Progressing Goal: Sleeping patterns will improve Outcome: Progressing   Problem: Coping: Goal: Ability to verbalize frustrations and anger appropriately will improve Outcome: Progressing Goal: Ability to demonstrate self-control will improve Outcome: Progressing   Problem: Health  Behavior/Discharge Planning: Goal: Identification of resources available to assist in meeting health care needs will improve Outcome: Progressing Goal: Compliance with treatment plan for underlying cause of condition will improve Outcome: Progressing   Problem: Physical Regulation: Goal: Ability to maintain clinical measurements within normal limits will improve Outcome: Progressing   Problem: Safety: Goal: Periods of time without injury will increase Outcome: Progressing   Problem: Education: Goal: Utilization of techniques to improve thought processes will improve Outcome: Progressing Goal: Knowledge of the prescribed therapeutic regimen will improve Outcome: Progressing   Problem: Activity: Goal: Interest or engagement in leisure activities will improve Outcome: Progressing Goal: Imbalance in normal sleep/wake cycle will improve Outcome: Progressing   Problem: Coping: Goal: Coping ability will improve Outcome: Progressing Goal: Will verbalize feelings Outcome: Progressing   Problem: Health Behavior/Discharge Planning: Goal: Ability to make decisions will improve Outcome: Progressing Goal: Compliance with therapeutic regimen will improve Outcome: Progressing   Problem: Role Relationship: Goal: Will demonstrate positive changes in social behaviors and relationships Outcome: Progressing   Problem: Safety: Goal: Ability to disclose and discuss suicidal ideas will improve Outcome: Progressing Goal: Ability to identify and utilize support systems that promote safety will improve Outcome: Progressing   Problem: Self-Concept: Goal: Will verbalize positive feelings about self Outcome: Progressing Goal: Level of anxiety will decrease Outcome: Progressing

## 2024-08-26 NOTE — Discharge Summary (Signed)
 " Physician Discharge Summary Note  Patient:  Jillian Shepherd is an 39 y.o., female MRN:  [id number] DOB:  1986/07/03 Patient phone:  [phone] (home)  Patient address:   [address] [place] [address],   Total time spent: 40 min Date of Admission:  08/23/2024 Date of Discharge: 08/26/24  Reason for Admission:  atient admitted following reported suicidal statements made to her husband in the context of miscarriage. Patient reports that when she initially learned she was pregnant, it was a miracle given her history of cancer and loss of her mother. She was very happy about the pregnancy and ceased smoking and drinking. At [redacted] weeks gestation, she began spotting and presented to her OB/GYN, where hCG levels were elevated. The following week, she experienced heavier bleeding, repeat hCG levels were declining, and she was informed she was likely miscarrying. This past Friday, she began passing clots, including one large clot approximately the size of a softball. She was home alone at the time (children at school, husband at work) and drank 2 beers due to being upset. When her children arrived home, she was tearful and informed them of the loss. Her husband entered the room and became argumentative rather than comforting, stating maybe if you would relax and sit down and not be so busy this would have worked out differently. Patient perceived these statements as blame for the miscarriage. She drank an additional beer following this interaction. Patient denies making or remembering any suicidal statements.   Principal Problem: Alcohol-induced mood disorder [facility]) Discharge Diagnoses: Principal Problem:   Alcohol-induced mood disorder (HCC)   Past Psychiatric History: see h&p  Family Psychiatric  History: see h&p Social History:  Social History   Substance and Sexual Activity  Alcohol Use Yes   Alcohol/week: 1.0 standard drink of alcohol   Types: 1 Cans of beer per week     Social  History   Substance and Sexual Activity  Drug Use No    Social History   Socioeconomic History   Marital status: Married    Spouse name: Not on file   Number of children: Not on file   Years of education: Not on file   Highest education level: Not on file  Occupational History   Not on file  Tobacco Use   Smoking status: Former    Current packs/day: 0.25    Average packs/day: 0.3 packs/day for 9.0 years (2.3 ttl pk-yrs)    Types: Cigarettes    Passive exposure: Current   Smokeless tobacco: Never  Vaping Use   Vaping status: Never Used  Substance and Sexual Activity   Alcohol use: Yes    Alcohol/week: 1.0 standard drink of alcohol    Types: 1 Cans of beer per week   Drug use: No   Sexual activity: Yes    Birth control/protection: None  Other Topics Concern   Not on file  Social History Narrative   ** Merged History Encounter **       Social Drivers of Health   Tobacco Use: Medium Risk (08/23/2024)   Patient History    Smoking Tobacco Use: Former    Smokeless Tobacco Use: Never    Passive Exposure: Current  Physicist, Medical Strain: Not on file  Food Insecurity: No Food Insecurity (08/23/2024)   Epic    Worried About Programme Researcher, Broadcasting/film/video in the Last Year: Never true    Ran Out of Food in the Last Year: Never true  Transportation Needs: No Transportation Needs (08/23/2024)  Epic    Lack of Transportation (Medical): No    Lack of Transportation (Non-Medical): No  Physical Activity: Not on file  Stress: Not on file  Social Connections: Socially Isolated (08/23/2024)   Social Connection and Isolation Panel    Frequency of Communication with Friends and Family: Once a week    Frequency of Social Gatherings with Friends and Family: Once a week    Attends Religious Services: Patient declined    Database Administrator or Organizations: No    Attends Banker Meetings: Never    Marital Status: Married  Depression (PHQ2-9): Low Risk (08/04/2024)    Depression (PHQ2-9)    PHQ-2 Score: 4  Alcohol Screen: Low Risk (08/23/2024)   Alcohol Screen    Last Alcohol Screening Score (AUDIT): 1  Housing: Unknown (08/23/2024)   Epic    Unable to Pay for Housing in the Last Year: No    Number of Times Moved in the Last Year: 0    Homeless in the Last Year: Not on file  Utilities: Not At Risk (08/23/2024)   Epic    Threatened with loss of utilities: No  Health Literacy: Not on file   Past Medical History:  Past Medical History:  Diagnosis Date   Abdominal pain    Alcohol abuse    B12 deficiency    Cancer (HCC)    squmaous cell skin cancer    Collagen vascular disease    Diabetes mellitus without complication (HCC)    Hypertension    Hypokalemia    Liddle's syndrome    Obesity (BMI 35.0-39.9 without comorbidity)    Pancreatitis    Pleural effusion    Splenic hemorrhage     Past Surgical History:  Procedure Laterality Date   ADENOIDECTOMY     CHEST TUBE INSERTION     COLONOSCOPY WITH PROPOFOL  N/A 10/06/2020   Procedure: COLONOSCOPY WITH PROPOFOL ;  Surgeon: Unk Corinn Skiff, MD;  Location: [facility] ENDOSCOPY;  Service: Gastroenterology;  Laterality: N/A;  COVID POSITIVE 08/23/2020   ESOPHAGOGASTRODUODENOSCOPY (EGD) WITH PROPOFOL  N/A 10/06/2020   Procedure: ESOPHAGOGASTRODUODENOSCOPY (EGD) WITH PROPOFOL ;  Surgeon: Unk Corinn Skiff, MD;  Location: [facility] ENDOSCOPY;  Service: Gastroenterology;  Laterality: N/A;   skin cancer removal Left    leg   TONSILLECTOMY     Family History:  Family History  Problem Relation Age of Onset   Hypertension Father    Anxiety disorder Father    Depression Father    Melanoma Paternal Grandmother        of skin   Diabetes Paternal Grandmother    Cirrhosis Paternal Uncle     Hospital Course:  atient admitted following reported suicidal statements made to her husband in the context of miscarriage. Patient reports that when she initially learned she was pregnant, it was a miracle given her history  of cancer and loss of her mother. She was very happy about the pregnancy and ceased smoking and drinking. At [redacted] weeks gestation, she began spotting and presented to her OB/GYN, where hCG levels were elevated. The following week, she experienced heavier bleeding, repeat hCG levels were declining, and she was informed she was likely miscarrying. This past Friday, she began passing clots, including one large clot approximately the size of a softball. She was home alone at the time (children at school, husband at work) and drank 2 beers due to being upset. When her children arrived home, she was tearful and informed them of the loss. Her husband entered the room  and became argumentative rather than comforting, stating maybe if you would relax and sit down and not be so busy this would have worked out differently. Patient perceived these statements as blame for the miscarriage. She drank an additional beer following this interaction. Patient denies making or remembering any suicidal statements. Patient is admitted to  psych unit with Q15 min safety monitoring. Multidisciplinary team approach is offered. Medication management; group/milieu therapy is offered.   On admission, patient was maintained on Zoloft  50 mg to help with depression and anxiety.  Patient was monitored for any alcohol withdrawal symptoms.  Patient had no complications during the hospitalization.  She maintained safe behaviors on the unit and participated in the groups.  She displayed significant improvement in his symptoms of depression and anxiety.  Treatment plan and discharge planning was discussed at length with her husband.  Detailed risk assessment is complete based on clinical exam and individual risk factors and acute suicide risk is low and acute violence risk is low.    On the day of discharge, patient denies SI/HI/plan and denies hallucinations.  Patient remains future oriented and is willing to participate in outpatient mental health  services.  Currently, all modifiable risk of harm to self/harm to others have been addressed and patient is no longer appropriate for the acute inpatient setting and is able to continue treatment for mental health needs in the community with the supports as indicated below.  Patient is educated and verbalized understanding of discharge plan of care including medications, follow-up appointments, mental health resources and further crisis services in the community.  He is instructed to call 911 or present to the nearest emergency room should he experience any decompensation in mood, disturbance of bowel or return of suicidal/homicidal ideations.  Patient verbalizes understanding of this education and agrees to this plan of care  Physical Findings: AIMS:  , ,  ,  ,    CIWA:  CIWA-Ar Total: 0 COWS:      Psychiatric Specialty Exam:  Presentation  General Appearance:  Appropriate for Environment  Eye Contact: Fair  Speech: Clear and Coherent  Speech Volume: Normal    Mood and Affect  Mood: Euthymic  Affect: Appropriate   Thought Process  Thought Processes: Coherent  Descriptions of Associations:Intact  Orientation:Full (Time, Place and Person)  Thought Content:Logical  Hallucinations:Hallucinations: None  Ideas of Reference:None  Suicidal Thoughts:Suicidal Thoughts: No  Homicidal Thoughts:Homicidal Thoughts: No   Sensorium  Memory: Immediate Fair  Judgment: Fair  Insight: Fair   Art Therapist  Concentration: Fair  Attention Span: Fair  Recall: Fair  Fund of Knowledge: Fair  Language: Fair   Psychomotor Activity  Psychomotor Activity: Psychomotor Activity: Normal  Musculoskeletal: Strength & Muscle Tone: within normal limits Gait & Station: normal Assets  Assets: Manufacturing Systems Engineer; Social Support   Sleep  Sleep: Sleep: Fair    Physical Exam: Physical Exam ROS Blood pressure (!) 131/98, pulse 71, temperature 97.9  F (36.6 C), temperature source Oral, resp. rate 16, height 5' 3 (1.6 m), weight 67.6 kg, SpO2 100%, unknown if currently breastfeeding. Body mass index is 26.39 kg/m.   Tobacco Use History[1] Tobacco Cessation:  A prescription for an FDA-approved tobacco cessation medication provided at discharge   Blood Alcohol level:  Lab Results  Component Value Date   ETH 263 (H) 08/20/2024   ETH <15 02/01/2024    Metabolic Disorder Labs:  Lab Results  Component Value Date   HGBA1C 11.1 (H) 04/29/2024   MPG 271.87 04/29/2024  MPG 312 01/16/2024   No results found for: PROLACTIN Lab Results  Component Value Date   CHOL 166 02/01/2024   TRIG 140 02/01/2024   HDL 39 (L) 02/01/2024   CHOLHDL 4.3 02/01/2024   VLDL 28 02/01/2024   LDLCALC 99 02/01/2024   LDLCALC 116 (H) 08/15/2023    See Psychiatric Specialty Exam and Suicide Risk Assessment completed by Attending Physician prior to discharge.  Discharge destination:  Home  Is patient on multiple antipsychotic therapies at discharge:  No   Has Patient had three or more failed trials of antipsychotic monotherapy by history:  No  Recommended Plan for Multiple Antipsychotic Therapies: NA  Discharge Instructions     Increase activity slowly   Complete by: As directed       Allergies as of 08/26/2024   No Known Allergies      Medication List     STOP taking these medications    Accu-Chek Guide Test test strip Generic drug: glucose blood   Accu-Chek Softclix Lancets lancets   BD Pen Needle Short Ultrafine 31G X 8 MM Misc Generic drug: Insulin  Pen Needle   Blood Glucose Monitoring Suppl Devi   calcium  carbonate 500 MG chewable tablet Commonly known as: TUMS - dosed in mg elemental calcium    FreeStyle Libre 3 Plus Sensor Misc   labetalol  100 MG tablet Commonly known as: NORMODYNE    Lantus  SoloStar 100 UNIT/ML Solostar Pen Generic drug: insulin  glargine Replaced by: insulin  glargine 100 UNIT/ML injection    lipase/protease/amylase 36000 UNITS Cpep capsule Commonly known as: Creon    lisinopril  40 MG tablet Commonly known as: ZESTRIL    oxyCODONE  5 MG immediate release tablet Commonly known as: Roxicodone    pioglitazone  30 MG tablet Commonly known as: Actos        TAKE these medications      Indication  acetaminophen  500 MG tablet Commonly known as: TYLENOL  Take 1,000 mg by mouth every 6 (six) hours as needed for mild pain (pain score 1-3).  Indication: Pain   folic acid  1 MG tablet Commonly known as: FOLVITE  Take 1 tablet (1 mg total) by mouth daily. Start taking on: August 27, 2024  Indication: Anemia From Inadequate Folic Acid    insulin  aspart 100 UNIT/ML injection Commonly known as: novoLOG  Inject 4 Units into the skin 3 (three) times daily with meals.  Indication: Type 1 Diabetes   insulin  glargine 100 UNIT/ML injection Commonly known as: LANTUS  Inject 0.18 mLs (18 Units total) into the skin at bedtime. Replaces: Lantus  SoloStar 100 UNIT/ML Solostar Pen  Indication: Type 1 Diabetes   melatonin 5 MG Tabs Take 1 tablet (5 mg total) by mouth at bedtime.  Indication: Trouble Sleeping   multivitamin with minerals Tabs tablet Take 1 tablet by mouth daily. Start taking on: August 27, 2024  Indication: Weight Loss   nicotine  14 mg/24hr patch Commonly known as: NICODERM CQ  - dosed in mg/24 hours Place 1 patch (14 mg total) onto the skin daily. Start taking on: August 27, 2024  Indication: Nicotine  Addiction   nicotine  polacrilex 2 MG gum Commonly known as: NICORETTE  Take 1 each (2 mg total) by mouth as needed for smoking cessation.  Indication: Nicotine  Addiction   sertraline  50 MG tablet Commonly known as: ZOLOFT  Take 1 tablet (50 mg total) by mouth daily. Start taking on: August 27, 2024  Indication: Major Depressive Disorder   thiamine  100 MG tablet Commonly known as: Vitamin B-1 Take 1 tablet (100 mg total) by mouth daily. Start taking on: August 27, 2024  Indication: Deficiency of Vitamin B1        Follow-up Information     [facility] Follow up.   Why: In person assessment for therapy and medication management is 08/28/24 at 11:00 AM. Contact information: [address] [place] [address] [phone]                 Follow-up recommendations:  Activity:  as tolerated    Signed: Francis Doenges, MD 08/26/2024, 3:52 PM          [1]  Social History Tobacco Use  Smoking Status Former   Current packs/day: 0.25   Average packs/day: 0.3 packs/day for 9.0 years (2.3 ttl pk-yrs)   Types: Cigarettes   Passive exposure: Current  Smokeless Tobacco Never   "

## 2024-08-26 NOTE — [facility] Suicide Risk Assessment (Signed)
 [facility] Discharge Suicide Risk Assessment   Principal Problem: Alcohol-induced mood disorder (HCC) Discharge Diagnoses: Principal Problem:   Alcohol-induced mood disorder (HCC)   Total Time spent with patient: 30 minutes  Musculoskeletal: Strength & Muscle Tone: within normal limits Gait & Station: normal Patient leans: N/A  Psychiatric Specialty Exam  Presentation  General Appearance:  Appropriate for Environment  Eye Contact: Fair  Speech: Clear and Coherent  Speech Volume: Normal  Handedness:No data recorded  Mood and Affect  Mood: Euthymic  Duration of Depression Symptoms: Greater than two weeks  Affect: Appropriate   Thought Process  Thought Processes: Coherent  Descriptions of Associations:Intact  Orientation:Full (Time, Place and Person)  Thought Content:Logical  History of Schizophrenia/Schizoaffective disorder:No  Duration of Psychotic Symptoms:No data recorded Hallucinations:Hallucinations: None  Ideas of Reference:None  Suicidal Thoughts:Suicidal Thoughts: No  Homicidal Thoughts:Homicidal Thoughts: No   Sensorium  Memory: Immediate Fair  Judgment: Fair  Insight: Fair   Art Therapist  Concentration: Fair  Attention Span: Fair  Recall: [employer] of Knowledge: Fair  Language: Fair   Psychomotor Activity  Psychomotor Activity: Psychomotor Activity: Normal   Assets  Assets: Communication Skills; Social Support   Sleep  Sleep: Sleep: Fair  Estimated Sleeping Duration (Last 24 Hours): 7.50-8.50 hours  Physical Exam: Physical Exam ROS Blood pressure (!) 131/98, pulse 71, temperature 97.9 F (36.6 C), temperature source Oral, resp. rate 16, height 5' 3 (1.6 m), weight 67.6 kg, SpO2 100%, unknown if currently breastfeeding. Body mass index is 26.39 kg/m.  Mental Status Per Nursing Assessment::   On Admission:  NA  Demographic Factors:  Low socioeconomic status  Loss Factors: Decrease in  vocational status  Historical Factors: Impulsivity  Risk Reduction Factors:   Living with another person, especially a relative, Positive social support, Positive therapeutic relationship, and Positive coping skills or problem solving skills  Continued Clinical Symptoms:  Depression:   Comorbid alcohol abuse/dependence  Cognitive Features That Contribute To Risk:  None    Suicide Risk:  Minimal: No identifiable suicidal ideation.  Patients presenting with no risk factors but with morbid ruminations; may be classified as minimal risk based on the severity of the depressive symptoms   Follow-up Information     [facility] Follow up.   Why: In person assessment for therapy and medication management is 08/28/24 at 11:00 AM. Contact information: [address] [place] [address] [phone]                 Plan Of Care/Follow-up recommendations:  Activity:  as tolerated  Allyn Foil, MD 08/26/2024, 3:51 PM

## 2024-08-27 ENCOUNTER — Telehealth: Payer: Self-pay | Admitting: *Deleted

## 2024-08-27 NOTE — Transitions of Care (Post Inpatient/ED Visit) (Signed)
" ° °  08/27/2024  Name: Jillian Shepherd MRN: [id number] DOB: [birth year]  Today's TOC FU Call Status: Today's TOC FU Call Status:: Unsuccessful Call (1st Attempt) Unsuccessful Call (1st Attempt) Date: 08/27/24  Attempted to reach the patient regarding the most recent Inpatient/ED visit.  Follow Up Plan: Additional outreach attempts will be made to reach the patient to complete the Transitions of Care (Post Inpatient/ED visit) call.   Andrea Dimes RN, BSN [place]  [facility] RN Care Manager [phone]  "

## 2024-08-28 ENCOUNTER — Telehealth: Payer: Self-pay | Admitting: *Deleted

## 2024-08-28 NOTE — Transitions of Care (Post Inpatient/ED Visit) (Signed)
" ° °  08/28/2024  Name: Jillian Shepherd MRN: [id number] DOB: 12-01-1985  Today's TOC FU Call Status: Today's TOC FU Call Status:: Unsuccessful Call (2nd Attempt) Unsuccessful Call (2nd Attempt) Date: 08/28/24  Attempted to reach the patient regarding the most recent Inpatient visit.  Received automated outgoing voice message stating that the person you are trying to call has a voice mail box that is full; please hang up and try your call again later; unable to leave voice message requesting call back   Follow Up Plan: Additional outreach attempts will be made to reach the patient to complete the Transitions of Care (Post Inpatient/ED visit) call.   Pls call/ message for questions,  Valeree Leidy Mckinney Naylene Foell, RN, BSN, CCRN [employer]umnus RN Care Manager  Transitions of Care  [facility] - [facility] [phone]: direct office  "
